# Patient Record
Sex: Male | Born: 1935
Health system: Southern US, Community
[De-identification: ages and names within clinical notes are randomized; demographics above are authoritative.]

## PROBLEM LIST (undated history)

## (undated) DIAGNOSIS — I10 Essential (primary) hypertension: Secondary | ICD-10-CM

## (undated) DIAGNOSIS — Z951 Presence of aortocoronary bypass graft: Secondary | ICD-10-CM

## (undated) DIAGNOSIS — G4733 Obstructive sleep apnea (adult) (pediatric): Secondary | ICD-10-CM

## (undated) DIAGNOSIS — A419 Sepsis, unspecified organism: Secondary | ICD-10-CM

## (undated) DIAGNOSIS — I440 Atrioventricular block, first degree: Secondary | ICD-10-CM

## (undated) DIAGNOSIS — K810 Acute cholecystitis: Secondary | ICD-10-CM

## (undated) DIAGNOSIS — R600 Localized edema: Secondary | ICD-10-CM

## (undated) DIAGNOSIS — I6523 Occlusion and stenosis of bilateral carotid arteries: Secondary | ICD-10-CM

## (undated) DIAGNOSIS — K8020 Calculus of gallbladder without cholecystitis without obstruction: Secondary | ICD-10-CM

## (undated) DIAGNOSIS — M48061 Spinal stenosis, lumbar region without neurogenic claudication: Secondary | ICD-10-CM

## (undated) DIAGNOSIS — R569 Unspecified convulsions: Secondary | ICD-10-CM

## (undated) DIAGNOSIS — E876 Hypokalemia: Secondary | ICD-10-CM

## (undated) DIAGNOSIS — R2689 Other abnormalities of gait and mobility: Secondary | ICD-10-CM

## (undated) DIAGNOSIS — F419 Anxiety disorder, unspecified: Secondary | ICD-10-CM

## (undated) DIAGNOSIS — M4316 Spondylolisthesis, lumbar region: Secondary | ICD-10-CM

## (undated) DIAGNOSIS — H8110 Benign paroxysmal vertigo, unspecified ear: Secondary | ICD-10-CM

## (undated) DIAGNOSIS — I639 Cerebral infarction, unspecified: Secondary | ICD-10-CM

## (undated) DIAGNOSIS — K219 Gastro-esophageal reflux disease without esophagitis: Secondary | ICD-10-CM

## (undated) DIAGNOSIS — K589 Irritable bowel syndrome without diarrhea: Secondary | ICD-10-CM

## (undated) DIAGNOSIS — G4730 Sleep apnea, unspecified: Secondary | ICD-10-CM

## (undated) DIAGNOSIS — K429 Umbilical hernia without obstruction or gangrene: Secondary | ICD-10-CM

## (undated) DIAGNOSIS — N3281 Overactive bladder: Secondary | ICD-10-CM

## (undated) DIAGNOSIS — E785 Hyperlipidemia, unspecified: Secondary | ICD-10-CM

## (undated) DIAGNOSIS — I739 Peripheral vascular disease, unspecified: Secondary | ICD-10-CM

## (undated) DIAGNOSIS — I5032 Chronic diastolic (congestive) heart failure: Secondary | ICD-10-CM

## (undated) DIAGNOSIS — N400 Enlarged prostate without lower urinary tract symptoms: Secondary | ICD-10-CM

## (undated) DIAGNOSIS — R159 Full incontinence of feces: Secondary | ICD-10-CM

## (undated) DIAGNOSIS — I2510 Atherosclerotic heart disease of native coronary artery without angina pectoris: Secondary | ICD-10-CM

## (undated) DIAGNOSIS — G2581 Restless legs syndrome: Secondary | ICD-10-CM

## (undated) DIAGNOSIS — C801 Malignant (primary) neoplasm, unspecified: Secondary | ICD-10-CM

## (undated) DIAGNOSIS — I219 Acute myocardial infarction, unspecified: Secondary | ICD-10-CM

## (undated) HISTORY — DX: Overactive bladder: N32.81

## (undated) HISTORY — DX: Spondylolisthesis, lumbar region: M43.16

## (undated) HISTORY — DX: Essential (primary) hypertension: I10

## (undated) HISTORY — PX: TONSILLECTOMY: SUR1361

## (undated) HISTORY — PX: COLONOSCOPY: SHX174

## (undated) HISTORY — DX: Atherosclerotic heart disease of native coronary artery without angina pectoris: I25.10

## (undated) HISTORY — DX: Spinal stenosis, lumbar region without neurogenic claudication: M48.061

## (undated) HISTORY — DX: Acute myocardial infarction, unspecified: I21.9

## (undated) HISTORY — DX: Benign prostatic hyperplasia without lower urinary tract symptoms: N40.0

## (undated) HISTORY — DX: Malignant (primary) neoplasm, unspecified: C80.1

---
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
## 1979-12-05 HISTORY — PX: NASAL SEPTOPLASTY W/ TURBINOPLASTY: SHX2070

## 2004-12-04 DIAGNOSIS — Z951 Presence of aortocoronary bypass graft: Secondary | ICD-10-CM

## 2004-12-04 DIAGNOSIS — I2510 Atherosclerotic heart disease of native coronary artery without angina pectoris: Secondary | ICD-10-CM

## 2004-12-04 DIAGNOSIS — I219 Acute myocardial infarction, unspecified: Secondary | ICD-10-CM

## 2004-12-04 HISTORY — DX: Acute myocardial infarction, unspecified: I21.9

## 2004-12-04 HISTORY — DX: Presence of aortocoronary bypass graft: Z95.1

## 2004-12-04 HISTORY — PX: LEFT HEART CATH AND CORONARY ANGIOGRAPHY: CATH118249

## 2004-12-04 HISTORY — DX: Atherosclerotic heart disease of native coronary artery without angina pectoris: I25.10

## 2004-12-04 HISTORY — PX: CORONARY ARTERY BYPASS GRAFT: SHX141

## 2010-03-14 ENCOUNTER — Ambulatory Visit: Payer: Self-pay | Admitting: Internal Medicine

## 2010-04-27 ENCOUNTER — Ambulatory Visit: Payer: Self-pay | Admitting: Gastroenterology

## 2011-10-16 ENCOUNTER — Emergency Department: Payer: Self-pay | Admitting: *Deleted

## 2012-07-09 ENCOUNTER — Ambulatory Visit (INDEPENDENT_AMBULATORY_CARE_PROVIDER_SITE_OTHER): Payer: Medicare Other | Admitting: Internal Medicine

## 2012-07-09 ENCOUNTER — Encounter: Payer: Self-pay | Admitting: Internal Medicine

## 2012-07-09 VITALS — BP 120/64 | HR 60 | Temp 97.7°F | Resp 14 | Ht 71.0 in | Wt 197.2 lb

## 2012-07-09 DIAGNOSIS — N400 Enlarged prostate without lower urinary tract symptoms: Secondary | ICD-10-CM

## 2012-07-09 DIAGNOSIS — C4491 Basal cell carcinoma of skin, unspecified: Secondary | ICD-10-CM | POA: Insufficient documentation

## 2012-07-09 DIAGNOSIS — R7309 Other abnormal glucose: Secondary | ICD-10-CM

## 2012-07-09 DIAGNOSIS — M810 Age-related osteoporosis without current pathological fracture: Secondary | ICD-10-CM | POA: Insufficient documentation

## 2012-07-09 DIAGNOSIS — I2510 Atherosclerotic heart disease of native coronary artery without angina pectoris: Secondary | ICD-10-CM | POA: Insufficient documentation

## 2012-07-09 DIAGNOSIS — I10 Essential (primary) hypertension: Secondary | ICD-10-CM

## 2012-07-09 DIAGNOSIS — R7302 Impaired glucose tolerance (oral): Secondary | ICD-10-CM | POA: Insufficient documentation

## 2012-07-09 DIAGNOSIS — H8110 Benign paroxysmal vertigo, unspecified ear: Secondary | ICD-10-CM | POA: Insufficient documentation

## 2012-07-09 DIAGNOSIS — E785 Hyperlipidemia, unspecified: Secondary | ICD-10-CM

## 2012-07-09 DIAGNOSIS — I219 Acute myocardial infarction, unspecified: Secondary | ICD-10-CM | POA: Insufficient documentation

## 2012-07-09 HISTORY — DX: Basal cell carcinoma of skin, unspecified: C44.91

## 2012-07-09 NOTE — Progress Notes (Signed)
Patient ID: David Atkins, male   DOB: July 20, 1929, 78 y.o.   MRN: [id number]  Patient Active Problem List  Diagnosis  . Osteoporosis  . BPH (benign prostatic hyperplasia)  . 3-vessel coronary artery disease  . Hypertension  . Myocardial infarction  . Other and unspecified hyperlipidemia  . Impaired glucose tolerance  . Basal cell carcinoma  . Vertigo, benign paroxysmal    Subjective:  CC:   Chief Complaint  Patient presents with  . New Patient    HPI:   David Atkins is a 78 y.o. male who presents as a new patient to establish primary care with the chief complaint of  1) obstructive sleep apnea   Has a history of OSA diagnosed many years ago,  Wears his facemask religiously  At 7 cm H20.  Last eval was before 2009.  2 or 3 prior repeat studies unchanged.  Uses [employer] for equipment.  Wakes up feeling refreshed,  No somnolence during day. 2) Ill fitting dentures.  His dentures were created by Dr. Breck Coons and clean still there so he came to as low deficit he has lost weight due to avoidance of certain foods. He is requesting help in finding a way to make these are correctly.   3) Chronic athlete's foot managed by [facility] with topicals.  4) Bladder outlet obstruction syndrome secondary to BPH.   No longer checking PSA<  Was 0.4 ,,  Stopped at age 64.  5) Recent blurring of vision in left eye,  Scheduled to see Dr. Fransico Michael again soon 6) Vertigo.  Quit smoking after 3 yrs in the service,  Quit in 1964. Was seeing Dr. Marguerite Olea and Dr. [facility]lyn Fudge [facility] twice annually for savings on medications.  He has relocated from [place] in 2009. Enjoys good health. 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    Now with [employer] .  Marland Kitchen Dentures are ill fitting ,cdone by Breck Coons in [place].  Painful to use the lower set, would like referral to a denture fitting plae.  Bowels have been regular. Last colonoscopy was many  years ago,  Last one was in 2002,  One polyp removed,  No FH of colon CA. Wants to defer further screening unless FOBT done annually is positive.      Past Medical History  Diagnosis Date  . Osteoporosis 2010    by DEXA at [facility]  . BPH (benign prostatic hyperplasia)   . 3-vessel coronary artery disease 2006  . Hypertension   . Myocardial infarction 2006  . basal cell     Past Surgical History  Procedure Date  . Appendectomy 1951  . Nasal septoplasty w/ turbinoplasty 1981  . Coronary artery bypass graft 2006    3 vessel, s/p AMI    History reviewed. No pertinent family history.  History   Social History  . Marital Status: Married    Spouse Name: N/A    Number of Children: N/A  . Years of Education: N/A   Occupational History  . Not on file.   Social History Main Topics  . Smoking status: Former Smoker    Types: Cigarettes    Quit date: 07/10/1963  . Smokeless tobacco: Never Used  . Alcohol Use: No  . Drug Use: No  . Sexually Active: Not on file   Other Topics Concern  . Not on file   Social History Narrative  . No narrative on file  Allergies  Allergen Reactions  . Sulfa Antibiotics      Review of Systems:   The remainder of the review of systems was negative except those addressed in the HPI.       Objective:  BP 120/64  Pulse 60  Temp 97.7 F (36.5 C) (Oral)  Resp 14  Ht 5\' 11"  (1.803 m)  Wt 197 lb 4 oz (89.472 kg)  BMI 27.51 kg/m2  SpO2 96%  General appearance: alert, cooperative and appears stated age Ears: normal TM's and external ear canals both ears Throat: lips, mucosa, and tongue normal; teeth and gums normal Neck: no adenopathy, no carotid bruit, supple, symmetrical, trachea midline and thyroid not enlarged, symmetric, no tenderness/mass/nodules Back: symmetric, no curvature. ROM normal. No CVA tenderness. Lungs: clear to auscultation bilaterally Heart: regular rate and rhythm, S1, S2 normal, no murmur, click, rub or  gallop Abdomen: soft, non-tender; bowel sounds normal; no masses,  no organomegaly Pulses: 2+ and symmetric Skin: Skin color, texture, turgor normal. No rashes or lesions Lymph nodes: Cervical, supraclavicular, and axillary nodes normal.  Assessment and Plan:  Other and unspecified hyperlipidemia Managed with zocor ,  LDl 71,  HDl 38 trigs 121   By July 2013.  Some myalgias with excessive physical activity,  He reamins very active .    Basal cell carcinoma Managed with Moh's by Dr. Glendell Docker at [facility].   3-vessel coronary artery disease Status post 3 vessel CABG remotely. He has been asymptomatic for years. He is on the appropriate medications. He will return for fasting labs and see that in 6 months.  Hypertension Currently well-controlled on current medications. No changes.  Impaired glucose tolerance He has never been diagnosed with diabetes. When he returns in 6 months we will do a hemoglobin A1c  BPH (benign prostatic hyperplasia) Managed with Cardura and Detrol LA.  Vertigo, benign paroxysmal Infrequent, managed with meclizine. He's had a recent recurrence after many years of quiescence   Updated Medication List Outpatient Encounter Prescriptions as of 07/09/2012  Medication Sig Dispense Refill  . acetaminophen (TYLENOL) 325 MG tablet Take 650 mg by mouth 2 (two) times daily as needed.      Marland Kitchen alendronate (FOSAMAX) 70 MG tablet Take 70 mg by mouth every 7 (seven) days. Take with a full glass of water on an empty stomach.      Marland Kitchen aspirin 81 MG tablet Take 81 mg by mouth daily.      . Calcium Carbonate-Vitamin D (CALCIUM + D PO) Take by mouth.      . doxazosin (CARDURA) 4 MG tablet Take 4 mg by mouth at bedtime.      Marland Kitchen lisinopril (PRINIVIL,ZESTRIL) 20 MG tablet Take 10 mg by mouth 2 (two) times daily.      . metoprolol tartrate (LOPRESSOR) 25 MG tablet Take 25 mg by mouth 2 (two) times daily.      . Saw Palmetto, Serenoa repens, (SAW PALMETTO PO) Take 2 tablets by mouth 2 (two)  times daily.      . simvastatin (ZOCOR) 40 MG tablet Take 20 mg by mouth every evening.      . tolterodine (DETROL LA) 4 MG 24 hr capsule Take 4 mg by mouth daily.         No orders of the defined types were placed in this encounter.    No Follow-up on file.

## 2012-07-09 NOTE — Assessment & Plan Note (Signed)
Managed with Moh's by Dr. Glendell Docker at [facility].

## 2012-07-09 NOTE — Assessment & Plan Note (Signed)
Managed with Cardura and Detrol LA.

## 2012-07-09 NOTE — Assessment & Plan Note (Signed)
Status post 3 vessel CABG remotely. He has been asymptomatic for years. He is on the appropriate medications. He will return for fasting labs and see that in 6 months.

## 2012-07-09 NOTE — Assessment & Plan Note (Signed)
Currently well-controlled on current medications. No changes.

## 2012-07-09 NOTE — Assessment & Plan Note (Signed)
Infrequent, managed with meclizine. He's had a recent recurrence after many years of quiescence

## 2012-07-09 NOTE — Assessment & Plan Note (Signed)
Managed with zocor ,  LDl 71,  HDl 38 trigs 121   By July 2013.  Some myalgias with excessive physical activity,  He reamins very active .

## 2012-07-09 NOTE — Assessment & Plan Note (Signed)
He has never been diagnosed with diabetes. When he returns in 6 months we will do a hemoglobin A1c

## 2012-12-01 ENCOUNTER — Emergency Department: Payer: Self-pay | Admitting: Emergency Medicine

## 2012-12-01 LAB — CBC WITH DIFFERENTIAL/PLATELET
Basophil #: 0 10*3/uL (ref 0.0–0.1)
Basophil %: 0.6 %
Eosinophil #: 0.2 10*3/uL (ref 0.0–0.7)
Eosinophil %: 2.4 %
HCT: 44.8 % (ref 40.0–52.0)
HGB: 15.1 g/dL (ref 13.0–18.0)
Lymphocyte #: 1.5 10*3/uL (ref 1.0–3.6)
Lymphocyte %: 19.1 %
MCH: 32.1 pg (ref 26.0–34.0)
MCHC: 33.7 g/dL (ref 32.0–36.0)
MCV: 95 fL (ref 80–100)
Monocyte #: 0.6 x10 3/mm (ref 0.2–1.0)
Monocyte %: 8 %
Neutrophil #: 5.5 10*3/uL (ref 1.4–6.5)
Neutrophil %: 69.9 %
Platelet: 172 10*3/uL (ref 150–440)
RBC: 4.71 10*6/uL (ref 4.40–5.90)
RDW: 13.2 % (ref 11.5–14.5)
WBC: 7.9 10*3/uL (ref 3.8–10.6)

## 2012-12-01 LAB — COMPREHENSIVE METABOLIC PANEL
Albumin: 3.7 g/dL (ref 3.4–5.0)
Alkaline Phosphatase: 73 U/L (ref 50–136)
Anion Gap: 8 (ref 7–16)
BUN: 13 mg/dL (ref 7–18)
Bilirubin,Total: 0.5 mg/dL (ref 0.2–1.0)
Calcium, Total: 8.6 mg/dL (ref 8.5–10.1)
Chloride: 111 mmol/L — ABNORMAL HIGH (ref 98–107)
Co2: 26 mmol/L (ref 21–32)
Creatinine: 1.13 mg/dL (ref 0.60–1.30)
EGFR (African American): >60
EGFR (Non-African Amer.): >60
Glucose: 93 mg/dL (ref 65–99)
Osmolality: 289 (ref 275–301)
Potassium: 3.8 mmol/L (ref 3.5–5.1)
SGOT(AST): 30 U/L (ref 15–37)
SGPT (ALT): 40 U/L (ref 12–78)
Sodium: 145 mmol/L (ref 136–145)
Total Protein: 6.9 g/dL (ref 6.4–8.2)

## 2012-12-06 ENCOUNTER — Encounter: Payer: Self-pay | Admitting: Internal Medicine

## 2012-12-06 ENCOUNTER — Ambulatory Visit (INDEPENDENT_AMBULATORY_CARE_PROVIDER_SITE_OTHER): Payer: Medicare Other | Admitting: Internal Medicine

## 2012-12-06 VITALS — BP 123/73 | HR 60 | Temp 98.0°F | Resp 12 | Ht 70.0 in | Wt 196.0 lb

## 2012-12-06 DIAGNOSIS — J40 Bronchitis, not specified as acute or chronic: Secondary | ICD-10-CM

## 2012-12-06 DIAGNOSIS — K208 Other esophagitis without bleeding: Secondary | ICD-10-CM

## 2012-12-06 DIAGNOSIS — R7302 Impaired glucose tolerance (oral): Secondary | ICD-10-CM

## 2012-12-06 DIAGNOSIS — I6522 Occlusion and stenosis of left carotid artery: Secondary | ICD-10-CM

## 2012-12-06 DIAGNOSIS — T50905A Adverse effect of unspecified drugs, medicaments and biological substances, initial encounter: Secondary | ICD-10-CM

## 2012-12-06 DIAGNOSIS — I6529 Occlusion and stenosis of unspecified carotid artery: Secondary | ICD-10-CM

## 2012-12-06 DIAGNOSIS — R9389 Abnormal findings on diagnostic imaging of other specified body structures: Secondary | ICD-10-CM

## 2012-12-06 DIAGNOSIS — R7309 Other abnormal glucose: Secondary | ICD-10-CM

## 2012-12-06 MED ORDER — BENZONATATE 200 MG PO CAPS: 200.0000 mg | ORAL_CAPSULE | Freq: Two times a day (BID) | ORAL | Status: DC | PRN

## 2012-12-06 MED ORDER — LEVOFLOXACIN 500 MG PO TABS: 500.0000 mg | ORAL_TABLET | Freq: Every day | ORAL | Status: DC

## 2012-12-06 MED ORDER — PREDNISONE (PAK) 10 MG PO TABS: ORAL_TABLET | ORAL | Status: DC

## 2012-12-06 NOTE — Patient Instructions (Addendum)
You have a sinusitis/bronchitis   .  I am prescribing an antibiotic (levaquin) and a prednisone taper  To manage the infectin and the inflammation in your ear/sinuses.   I also advise use of the following OTC meds to help with your other symptoms.    flushes your sinuses twice daily with Simply Saline (do over the sink because if you do it right you will spit out globs of mucus)  Use benzonatate capsules every 8 hours   FOR THE COUGH.  Gargle with the Magic Mouthwash th ER doctor gave your for sore throat.   I am referring you to Dr. Gilda Crease for evaluation of your carotid arteries

## 2012-12-06 NOTE — Progress Notes (Signed)
Patient ID: David Atkins, male   DOB: [birth year], 79 y.o.   MRN: [id number]    Patient Active Problem List  Diagnosis  . Osteoporosis  . BPH (benign prostatic hyperplasia)  . 3-vessel coronary artery disease  . Hypertension  . Myocardial infarction  . Other and unspecified hyperlipidemia  . Impaired glucose tolerance  . Basal cell carcinoma  . Vertigo, benign paroxysmal  . Bronchitis with tracheitis  . Pill esophagitis    Subjective:  CC:   No chief complaint on file.   HPI:   David Hutchingsis a 79 y.o. male who presents Er follow up. ON the day in question he ingested a large vitamin c tablet broken into 3 pieces and developed the sensation that one of the fragments lodged in the back of his throat.    He underwent a foreign body eval in ED including a CT of neck and fiberoptic laryngoscopy which revealed no foreign body, only a  mucous retention cyst. However the  CT of neck raised a concern for carotid stenosis of the left IC bulb and vascular evaluation was recommended.  For the past 3 days he has had a severe cough.  Symptoms began after his ER visit. His cough is productive of green sputum. He's been having bodyaches relieved with Tylenol and over-the-counter medications. He is been avoiding antihistamines as they bother his prostate. His wife David Atkins is having surgery on Monday and he is concerned he would not be well enough to be around her.,     Past Medical History  Diagnosis Date  . Osteoporosis 2010    by DEXA at [facility]  . BPH (benign prostatic hyperplasia)   . 3-vessel coronary artery disease 2006  . Hypertension   . Myocardial infarction 2006  . basal cell     Past Surgical History  Procedure Date  . Appendectomy 1951  . Nasal septoplasty w/ turbinoplasty 1981  . Coronary artery bypass graft 2006    3 vessel, s/p AMI     The following portions of the patient's history were reviewed and updated as appropriate: Allergies, current medications, and problem  list.    Review of Systems:  Patient denies , fevers, malaise, unintentional weight loss, skin rash, eye pain, dysphagia,  hemoptysis , dyspnea, wheezing, chest pain, palpitations, orthopnea, edema, abdominal pain, nausea, melena, diarrhea, constipation, flank pain, dysuria, hematuria, urinary  Frequency, nocturia, numbness, tingling, seizures,  Focal weakness, Loss of consciousness,  Tremor, insomnia, depression, anxiety, and suicidal ideation.        History   Social History  . Marital Status: Married    Spouse Name: N/A    Number of Children: N/A  . Years of Education: N/A   Occupational History  . Not on file.   Social History Main Topics  . Smoking status: Former Smoker    Types: Cigarettes    Quit date: 07/10/1963  . Smokeless tobacco: Never Used  . Alcohol Use: No  . Drug Use: No  . Sexually Active: Not on file   Other Topics Concern  . Not on file   Social History Narrative  . No narrative on file    Objective:  BP 123/73  Pulse 60  Temp 98 F (36.7 C) (Oral)  Resp 12  Ht 5\' 10"  (1.778 m)  Wt 196 lb (88.905 kg)  BMI 28.12 kg/m2  SpO2 98%  General appearance: alert, cooperative and appears stated age Ears: normal TM's and external ear canals both ears Throat: lips, mucosa, and tongue normal;  teeth and gums normal Neck: no adenopathy, no carotid bruit, supple, symmetrical, trachea midline and thyroid not enlarged, symmetric, no tenderness/mass/nodules Back: symmetric, no curvature. ROM normal. No CVA tenderness. Lungs: clear to auscultation bilaterally Heart: regular rate and rhythm, S1, S2 normal, no murmur, click, rub or gallop Abdomen: soft, non-tender; bowel sounds normal; no masses,  no organomegaly Pulses: 2+ and symmetric Skin: Skin color, texture, turgor normal. No rashes or lesions Lymph nodes: Cervical, supraclavicular, and axillary nodes normal.  Assessment and Plan:  Impaired glucose tolerance His fasting glucoses have been below the  range of diabetes diagnosis per labs sent by the [facility]. The prednisone taper which is being prescribed today may elevate his blood sugars if he is to have blood work done by the [facility]. He was advised of this today.  Bronchitis with tracheitis Accompanied by sinusitis and dull eardrums  concerning for early otitis. Likely due to recent exposure in the ER. Levaquin and Tessalon and prednisone taper prescribed for 7 days.  Pill esophagitis Secondary to vitamin C ingestion. ER evaluation included ENT fiberoptic laryngoscopy which showed erythema and irritation but no pill fragments. Since he is still symptomatic I have recommended that he resume the Magic mouthwash that was prescribed to him by the ER physician.  Abnormal CT scan, neck Referral to vascular surgery for ultrasound of carotid arteries has been done. He does have risk factors including hyperlipidemia and known coronary artery disease. He has no bruit on exam.   Updated Medication List Outpatient Encounter Prescriptions as of 12/06/2012  Medication Sig Dispense Refill  . acetaminophen (TYLENOL) 325 MG tablet Take 650 mg by mouth 2 (two) times daily as needed.      Marland Kitchen alendronate (FOSAMAX) 70 MG tablet Take 70 mg by mouth every 7 (seven) days. Take with a full glass of water on an empty stomach.      Marland Kitchen aspirin 81 MG tablet Take 81 mg by mouth daily.      . Calcium Carbonate-Vitamin D (CALCIUM + D PO) Take by mouth.      . doxazosin (CARDURA) 4 MG tablet Take 4 mg by mouth at bedtime.      Marland Kitchen lisinopril (PRINIVIL,ZESTRIL) 20 MG tablet Take 10 mg by mouth 2 (two) times daily.      . metoprolol tartrate (LOPRESSOR) 25 MG tablet Take 25 mg by mouth 2 (two) times daily.      . Saw Palmetto, Serenoa repens, (SAW PALMETTO PO) Take 2 tablets by mouth 2 (two) times daily.      . simvastatin (ZOCOR) 40 MG tablet Take 20 mg by mouth every evening.      . tolterodine (DETROL LA) 4 MG 24 hr capsule Take 4 mg by mouth daily.      .  benzonatate (TESSALON) 200 MG capsule Take 1 capsule (200 mg total) by mouth 2 (two) times daily as needed for cough.  20 capsule  0  . levofloxacin (LEVAQUIN) 500 MG tablet Take 1 tablet (500 mg total) by mouth daily.  7 tablet  0  . predniSONE (STERAPRED UNI-PAK) 10 MG tablet 6 tablets on day 1, decrease by tablet daily until gone  21 tablet  0

## 2012-12-07 ENCOUNTER — Encounter: Payer: Self-pay | Admitting: Internal Medicine

## 2012-12-07 DIAGNOSIS — T50905A Adverse effect of unspecified drugs, medicaments and biological substances, initial encounter: Secondary | ICD-10-CM | POA: Insufficient documentation

## 2012-12-07 DIAGNOSIS — I6523 Occlusion and stenosis of bilateral carotid arteries: Secondary | ICD-10-CM | POA: Insufficient documentation

## 2012-12-07 DIAGNOSIS — J40 Bronchitis, not specified as acute or chronic: Secondary | ICD-10-CM | POA: Insufficient documentation

## 2012-12-07 NOTE — Assessment & Plan Note (Signed)
His fasting glucoses have been below the range of diabetes diagnosis per labs sent by the [facility].

## 2012-12-07 NOTE — Assessment & Plan Note (Addendum)
Accompanied by sinusitis and dull eardrums  concerning for early otitis. Likely due to recent exposure in the ER. Levaquin and Tessalon and prednisone taper prescribed for 7 days.

## 2012-12-07 NOTE — Assessment & Plan Note (Signed)
Secondary to vitamin C ingestion. ER evaluation included ENT fiberoptic laryngoscopy which showed erythema and irritation but no pill fragments. Since he is still symptomatic I have recommended that he resume the Magic mouthwash that was prescribed to him by the ER physician.

## 2012-12-07 NOTE — Assessment & Plan Note (Signed)
Referral to vascular surgery for ultrasound of carotid arteries has been done. He does have risk factors including hyperlipidemia and known coronary artery disease. He has no bruit on exam.

## 2012-12-19 ENCOUNTER — Other Ambulatory Visit: Payer: Self-pay | Admitting: Internal Medicine

## 2012-12-19 NOTE — Telephone Encounter (Signed)
Should patient get a refill?  Please advise.

## 2012-12-20 NOTE — Telephone Encounter (Signed)
Refills sent.  Needs appt for follow up next week

## 2012-12-24 ENCOUNTER — Telehealth: Payer: Self-pay | Admitting: General Practice

## 2012-12-24 NOTE — Telephone Encounter (Signed)
[facility] called stating pt was walking around looking for his appt place. Per [facility] I do not see an appt for today on the pt schedule.

## 2012-12-26 NOTE — Telephone Encounter (Signed)
Pt has an appt 01/06/13.

## 2013-01-06 ENCOUNTER — Encounter: Payer: Self-pay | Admitting: Internal Medicine

## 2013-01-06 ENCOUNTER — Ambulatory Visit (INDEPENDENT_AMBULATORY_CARE_PROVIDER_SITE_OTHER): Payer: Medicare Other | Admitting: Internal Medicine

## 2013-01-06 VITALS — BP 124/58 | HR 71 | Temp 97.9°F | Resp 16 | Wt 201.0 lb

## 2013-01-06 DIAGNOSIS — R1310 Dysphagia, unspecified: Secondary | ICD-10-CM

## 2013-01-06 DIAGNOSIS — J40 Bronchitis, not specified as acute or chronic: Secondary | ICD-10-CM

## 2013-01-06 DIAGNOSIS — E785 Hyperlipidemia, unspecified: Secondary | ICD-10-CM

## 2013-01-06 DIAGNOSIS — K208 Other esophagitis without bleeding: Secondary | ICD-10-CM

## 2013-01-06 DIAGNOSIS — K219 Gastro-esophageal reflux disease without esophagitis: Secondary | ICD-10-CM

## 2013-01-06 DIAGNOSIS — E559 Vitamin D deficiency, unspecified: Secondary | ICD-10-CM

## 2013-01-06 DIAGNOSIS — T50905A Adverse effect of unspecified drugs, medicaments and biological substances, initial encounter: Secondary | ICD-10-CM

## 2013-01-06 DIAGNOSIS — R790 Abnormal level of blood mineral: Secondary | ICD-10-CM

## 2013-01-06 DIAGNOSIS — R7989 Other specified abnormal findings of blood chemistry: Secondary | ICD-10-CM

## 2013-01-06 LAB — COMPREHENSIVE METABOLIC PANEL
ALT: 44 U/L (ref 0–53)
AST: 39 U/L — ABNORMAL HIGH (ref 0–37)
Albumin: 3.5 g/dL (ref 3.5–5.2)
Alkaline Phosphatase: 49 U/L (ref 39–117)
BUN: 18 mg/dL (ref 6–23)
CO2: 27 mEq/L (ref 19–32)
Calcium: 8.7 mg/dL (ref 8.4–10.5)
Chloride: 109 mEq/L (ref 96–112)
Creatinine, Ser: 1.1 mg/dL (ref 0.4–1.5)
GFR: 65.42 mL/min (ref >60.00)
Glucose, Bld: 108 mg/dL — ABNORMAL HIGH (ref 70–99)
Potassium: 4.2 mEq/L (ref 3.5–5.1)
Sodium: 142 mEq/L (ref 135–145)
Total Bilirubin: 0.5 mg/dL (ref 0.3–1.2)
Total Protein: 6.2 g/dL (ref 6.0–8.3)

## 2013-01-06 LAB — MAGNESIUM: Magnesium: 1.9 mg/dL (ref 1.5–2.5)

## 2013-01-06 MED ORDER — OMEPRAZOLE 20 MG PO CPDR: 20.0000 mg | DELAYED_RELEASE_CAPSULE | Freq: Every day | ORAL | Status: DC

## 2013-01-06 NOTE — Assessment & Plan Note (Addendum)
Secondary to vitamin C ingestion. ER evaluation included ENT fiberoptic laryngoscopy which showed erythema and irritation but no pill fragments. continued symptoms with large pills but not with alendronate.  Barium swallow, modified to rule out stricture.

## 2013-01-06 NOTE — Patient Instructions (Addendum)
I am checking your magnesium and vitamin D levels today  You may try a chewable calcium supplement  I am ordering a barium swallow study to evaluate your swallowing issues

## 2013-01-06 NOTE — Assessment & Plan Note (Signed)
Now resolved after levaquin 7 days and prednisone taper

## 2013-01-06 NOTE — Progress Notes (Signed)
Patient ID: David Atkins, male   DOB: 05-01-1929, 79 y.o.   MRN: [id number]  Patient Active Problem List  Diagnosis  . Osteoporosis  . BPH (benign prostatic hyperplasia)  . 3-vessel coronary artery disease  . Hypertension  . Myocardial infarction  . Other and unspecified hyperlipidemia  . Impaired glucose tolerance  . Basal cell carcinoma  . Vertigo, benign paroxysmal  . Bronchitis with tracheitis  . Pill esophagitis  . Abnormal CT scan, neck  . GERD (gastroesophageal reflux disease)    Subjective:  CC:   Chief Complaint  Patient presents with  . Follow-up    HPI:   David Hutchingsis a 79 y.o. male who presents Follow up on multiple medical issues.  1) His recent episode of bronchitis is finally resolving without sequelae of cough or dyspnea.   2) The recent finding of carotid stenosis by CT of the neck has been quantified with carotid ultrasound and Vascular surgery as less than 50% .  ASA and statin were recommended,  Follow up on e year.    3) He continues to have trouble managing large pills since he had the ER visit for swallowing a pill fragment  No choking episdoes or cough after eating but has the sensatio nof large pills getting stuck .    4) He continues to have symptoms of reflux despite using ranitidine daily.     Past Medical History  Diagnosis Date  . Osteoporosis 2010    by [facility] at [facility]  . BPH (benign prostatic hyperplasia)   . 3-vessel coronary artery disease 2006  . Hypertension   . Myocardial infarction 2006  . basal cell     Past Surgical History  Procedure Date  . Appendectomy 1951  . Nasal septoplasty w/ turbinoplasty 1981  . Coronary artery bypass graft 2006    3 vessel, s/p AMI         The following portions of the patient's history were reviewed and updated as appropriate: Allergies, current medications, and problem list.    Review of Systems:   Patient denies headache, fevers, malaise, unintentional weight loss, skin rash, eye  pain, sinus congestion and sinus pain, sore throat,  hemoptysis , cough, dyspnea, wheezing, chest pain, palpitations, orthopnea, edema, abdominal pain, nausea, melena, diarrhea, constipation, flank pain, dysuria, hematuria, urinary  Frequency, nocturia, numbness, tingling, seizures,  Focal weakness, Loss of consciousness,  Tremor, insomnia, depression, anxiety, and suicidal ideation.         History   Social History  . Marital Status: Married    Spouse Name: N/A    Number of Children: N/A  . Years of Education: N/A   Occupational History  . Not on file.   Social History Main Topics  . Smoking status: Former Smoker    Types: Cigarettes    Quit date: 07/10/1963  . Smokeless tobacco: Never Used  . Alcohol Use: No  . Drug Use: No  . Sexually Active: Not on file   Other Topics Concern  . Not on file   Social History Narrative  . No narrative on file    Objective:  BP 124/58  Pulse 71  Temp 97.9 F (36.6 C) (Oral)  Resp 16  Wt 201 lb (91.173 kg)  SpO2 97%  General appearance: alert, cooperative and appears stated age Ears: normal TM's and external ear canals both ears Throat: lips, mucosa, and tongue normal; teeth and gums normal Neck: no adenopathy, no carotid bruit, supple, symmetrical, trachea midline and thyroid not enlarged, symmetric,  no tenderness/mass/nodules Back: symmetric, no curvature. ROM normal. No CVA tenderness. Lungs: clear to auscultation bilaterally Heart: regular rate and rhythm, S1, S2 normal, no murmur, click, rub or gallop Abdomen: soft, non-tender; bowel sounds normal; no masses,  no organomegaly Pulses: 2+ and symmetric Skin: Skin color, texture, turgor normal. No rashes or lesions Lymph nodes: Cervical, supraclavicular, and axillary nodes normal.  Assessment and Plan:  Bronchitis with tracheitis Now resolved after levaquin 7 days and prednisone taper  Pill esophagitis Secondary to vitamin C ingestion. ER evaluation included ENT  fiberoptic laryngoscopy which showed erythema and irritation but no pill fragments. continued symptoms with large pills but not with alendronate.  Barium swallow, modified to rule out stricture.  GERD (gastroesophageal reflux disease) Stopping ranitidine and starting PPI   Updated Medication List Outpatient Encounter Prescriptions as of 01/06/2013  Medication Sig Dispense Refill  . acetaminophen (TYLENOL) 325 MG tablet Take 650 mg by mouth 2 (two) times daily as needed.      Marland Kitchen alendronate (FOSAMAX) 70 MG tablet Take 70 mg by mouth every 7 (seven) days. Take with a full glass of water on an empty stomach.      Marland Kitchen aspirin 81 MG tablet Take 81 mg by mouth daily.      . Calcium Carbonate-Vitamin D (CALCIUM + D PO) Take by mouth.      . doxazosin (CARDURA) 4 MG tablet Take 4 mg by mouth at bedtime.      Marland Kitchen lisinopril (PRINIVIL,ZESTRIL) 20 MG tablet Take 10 mg by mouth 2 (two) times daily.      . metoprolol tartrate (LOPRESSOR) 25 MG tablet Take 25 mg by mouth 2 (two) times daily.      . Saw Palmetto, Serenoa repens, (SAW PALMETTO PO) Take 2 tablets by mouth 2 (two) times daily.      . simvastatin (ZOCOR) 40 MG tablet Take 20 mg by mouth every evening.      . tolterodine (DETROL LA) 4 MG 24 hr capsule Take 4 mg by mouth daily.      . [DISCONTINUED] benzonatate (TESSALON) 200 MG capsule TAKE 1 CAPSULE (200 MG TOTAL) BY MOUTH 2 (TWO) TIMES DAILY AS NEEDED FOR COUGH.  20 capsule  0  . [DISCONTINUED] levofloxacin (LEVAQUIN) 500 MG tablet TAKE 1 TABLET (500 MG TOTAL) BY MOUTH DAILY.  7 tablet  0  . [DISCONTINUED] predniSONE (STERAPRED UNI-PAK) 10 MG tablet 6 tablets on day 1, decrease by tablet daily until gone  21 tablet  0  . omeprazole (PRILOSEC) 20 MG capsule Take 1 capsule (20 mg total) by mouth daily.  30 capsule  3     Orders Placed This Encounter  Procedures  . Magnesium  . Vitamin D 25 hydroxy  . Comprehensive metabolic panel  . SLP modified barium swallow    Return in about 6 months  (around 07/06/2013).

## 2013-01-06 NOTE — Assessment & Plan Note (Signed)
Stopping ranitidine and starting PPI

## 2013-01-07 LAB — VITAMIN D 25 HYDROXY (VIT D DEFICIENCY, FRACTURES): Vit D, 25-Hydroxy: 32 ng/mL (ref 30–89)

## 2013-01-07 NOTE — Addendum Note (Signed)
Addended by: Sherlene Shams on: 01/07/2013 06:54 AM   Modules accepted: Orders

## 2013-01-13 ENCOUNTER — Ambulatory Visit (INDEPENDENT_AMBULATORY_CARE_PROVIDER_SITE_OTHER): Payer: Medicare Other | Admitting: Internal Medicine

## 2013-01-13 ENCOUNTER — Encounter: Payer: Self-pay | Admitting: Internal Medicine

## 2013-01-13 VITALS — BP 122/68 | HR 67 | Temp 97.9°F | Resp 16 | Ht 69.5 in | Wt 204.5 lb

## 2013-01-13 DIAGNOSIS — E669 Obesity, unspecified: Secondary | ICD-10-CM

## 2013-01-13 DIAGNOSIS — Z125 Encounter for screening for malignant neoplasm of prostate: Secondary | ICD-10-CM

## 2013-01-13 DIAGNOSIS — K219 Gastro-esophageal reflux disease without esophagitis: Secondary | ICD-10-CM

## 2013-01-13 DIAGNOSIS — Z1211 Encounter for screening for malignant neoplasm of colon: Secondary | ICD-10-CM

## 2013-01-13 DIAGNOSIS — Z0000 Encounter for general adult medical examination without abnormal findings: Secondary | ICD-10-CM

## 2013-01-13 DIAGNOSIS — N400 Enlarged prostate without lower urinary tract symptoms: Secondary | ICD-10-CM

## 2013-01-13 DIAGNOSIS — R609 Edema, unspecified: Secondary | ICD-10-CM

## 2013-01-13 NOTE — Patient Instructions (Addendum)
It's time to get your ears cleaned  Your diet has too many complex carbohydrates (starches) in it; you are gaining weight and at increased risk for developing type 2 diabetes   I would you to return in 3 months for fasting blood work.  In the meantime,  I recommend that you follow a low Glycemic index diet   This is  my version of a  "Low GI"  Diet:  It replaces most of your starch meals with high protein, lower carbohydrate choices and  will still lower your blood sugars and allow you to lose 5 to 10 lbs per month if you follow it carefully. All of the foods can be found at grocery stores and in bulk at [employer].  The Atkins protein bars and the protein shakes are available in more varieties at [employer], [employer] and [employer].     7 AM Breakfast:  Low carbohydrate Protein  Shakes (I recommend the EAS AdvantEdge "Carb Control" shakes  Or the low carb shakes by Atkins.   Both are available everywhere:  In  cases at [employer]Js  Or in 4 packs at grocery stores and pharmacies  2.5 carbs  (Alternative is  a toasted Arnold's Sandwhich Thin w/ peanut butter, a "Bagel Thin" with cream cheese and salmon) or  a scrambled egg burrito made with a low carb tortilla .  Avoid cereal and bananas, oatmeal too unless you are cooking the old fashioned kind that takes 30-40 minutes to prepare.  the rest is overly processed, has minimal fiber, and is loaded with carbohydrates!   10 AM: Protein bar by Atkins (the snack size, under 200 cal).  There are many varieties , available widely again or in bulk in limited varieties at BJs)  Other so called "protein bars" tend to be loaded with carbohydrates.  Remember, in food advertising, the word "energy" is synonymous for " carbohydrate."  Lunch: sandwich of [place], (or any lunchmeat, grilled meat or canned tuna), fresh avocado, mayonnaise  and cheese on a lower carbohydrate pita bread, flatbread, or tortilla . Ok to use regular mayonnaise. The bread is the only source or carbohydrate  that can be decreased (Joseph's makes a pita bread and a flat bread that are 50 cal and 4 net carbs ; Toufayan makes a low carb flatbread that's 100 cal and 9 net carbs  and  Mission makes a low carb whole wheat tortilla  That is 210 cal and 6 net carbs)  3 PM:  Mid day :  Another protein bar,  Or a  cheese stick (100 cal, 0 carbs),  Or 1 ounce of  almonds, walnuts, pistachios, pecans, peanuts,  Macadamia nuts. Or a Dannon light n Fit greek yogurt, 80 cal 8 net carbs . Avoid "granola"; the dried cranberries and raisins are loaded with carbohydrates. Mixed nuts ok if no raisins or cranberries or dried fruit.      6 PM  Dinner:  "mean and green:"  Meat/chicken/fish or a high protein legume; , with a green salad, and a low GI  Veggie (broccoli, cauliflower, green beans, spinach, brussel sprouts. Lima beans) : Avoid "Low fat dressings, as well as Reyne Dumas and [address]! They are loaded with sugar! Instead use ranch, vinagrette,  Blue cheese, etc.  There is a low carb pasta by Dreamfield's available at [facility] that is acceptable and tastes great. Try Michel Angel's chicken piccata over low carb pasta. The chicken dish is 0 carbs, and can be found in  frozen section at BJs and [employer]. Also try [employer]" (pulled pork, no sauce,  0 carbs) and his pot roast.   both are in the refrigerated section at BJs   Dreamfield's makes a low carb pasta only 5 g/serving.  Available at all grocery stores,  And tastes like normal pasta  9 PM snack : Breyer's "low carb" fudgsicle or  ice cream bar (Carb Smart line), or  Weight Watcher's ice cream bar , or another "no sugar added" ice cream;a serving of fresh berries/cherries with whipped cream (Avoid bananas, pineapple, grapes  and watermelon on a regular basis because they are high in sugar)   Remember that snack Substitutions should be less than 10 carbs per serving and meals < 20 carbs. Remember to subtract fiber grams and sugar alcohols to get  the "net carbs."

## 2013-01-13 NOTE — Assessment & Plan Note (Addendum)
Uses saw palmetto  . Prostate exam was normal today.

## 2013-01-13 NOTE — Progress Notes (Signed)
Patient ID: David Atkins, male   DOB: [birth year], 79 y.o.   MRN: [id number]  The patient is here for annual Medicare wellness examination and management of other chronic and acute problems.   The risk factors are reflected in the social history.  The roster of all physicians providing medical care to patient - is listed in the Snapshot section of the chart.  Activities of daily living:  The patient is 100% independent in all ADLs: dressing, toileting, feeding as well as independent mobility  Home safety : The patient has smoke detectors in the home. They wear seatbelts.  There are no firearms at home. There is no violence in the home.   There is no risks for hepatitis, STDs or HIV. There is no   history of blood transfusion. They have no travel history to infectious disease endemic areas of the world.  The patient has seen their dentist in the last six month. They have seen their eye doctor in the last year. They admit to slight hearing difficulty with regard to whispered voices and some television programs.  They have deferred audiologic testing in the last year.  They do not  have excessive sun exposure. Discussed the need for sun protection: hats, long sleeves and use of sunscreen if there is significant sun exposure.   Diet: the importance of a healthy diet is discussed. They do have a healthy diet.  The benefits of regular aerobic exercise were discussed. She walks 4 times per week ,  20 minutes.   Depression screen: there are no signs or vegative symptoms of depression- irritability, change in appetite, anhedonia, sadness/tearfullness.  Cognitive assessment: the patient manages all their financial and personal affairs and is actively engaged. They could relate day,date,year and events; recalled 2/3 objects at 3 minutes; performed clock-face test normally.  The following portions of the patient's history were reviewed and updated as appropriate: allergies, current medications, past family  history, past medical history,  past surgical history, past social history  and problem list.  Visual acuity was not assessed per patient preference since she has regular follow up with her ophthalmologist. Hearing and body mass index were assessed and reviewed.   During the course of the visit the patient was educated and counseled about appropriate screening and preventive services including : fall prevention , diabetes screening, nutrition counseling, colorectal cancer screening, and recommended immunizations.    Objective  BP 122/68  Pulse 67  Temp(Src) 97.9 F (36.6 C) (Oral)  Resp 16  Ht 5' 9.5" (1.765 m)  Wt 204 lb 8 oz (92.761 kg)  BMI 29.78 kg/m2  SpO2 95%  General Appearance:    Alert, cooperative, no distress, appears stated age  Head:    Normocephalic, without obvious abnormality, atraumatic  Eyes:    PERRL, conjunctiva/corneas clear, EOM's intact, fundi    benign, both eyes       Ears:    Normal TM's and external ear canals, both ears  Nose:   Nares normal, septum midline, mucosa normal, no drainage   or sinus tenderness  Throat:   Lips, mucosa, and tongue normal; teeth and gums normal  Neck:   Supple, symmetrical, trachea midline, no adenopathy;       thyroid:  No enlargement/tenderness/nodules; no carotid   bruit or JVD  Back:     Symmetric, no curvature, ROM normal, no CVA tenderness  Lungs:     Clear to auscultation bilaterally, respirations unlabored  Chest wall:    No tenderness or deformity  Heart:    Regular rate and rhythm, S1 and S2 normal, no murmur, rub   or gallop  Abdomen:     Soft, non-tender, bowel sounds active all four quadrants,    no masses, no organomegaly  Genitalia:    Normal male without lesion, discharge or tenderness  Rectal:    Normal tone, normal prostate, no masses or tenderness;   guaiac negative stool  Extremities:   Extremities normal, atraumatic, no cyanosis. 3+ pitting  edema.  Severe onychomycosis  Pulses:   2+ and symmetric all  extremities  Skin:   Skin color, texture, turgor normal, no rashes or lesions  Lymph nodes:   Cervical, supraclavicular, and axillary nodes normal  Neurologic:   CNII-XII intact. Normal strength, sensation and reflexes      throughout    Assessment and Plan  BPH (benign prostatic hyperplasia) Uses saw palmetto  . Prostate exam was normal today.   GERD (gastroesophageal reflux disease) Symptoms controlled now with prilosec   Routine general medical examination at a health care facility Testicular, penile, prostate exam and FOBT exam was done today and normal    Updated Medication List Outpatient Encounter Prescriptions as of 01/13/2013  Medication Sig Dispense Refill  . acetaminophen (TYLENOL) 325 MG tablet Take 650 mg by mouth 2 (two) times daily as needed.      Marland Kitchen alendronate (FOSAMAX) 70 MG tablet Take 70 mg by mouth every 7 (seven) days. Take with a full glass of water on an empty stomach.      Marland Kitchen aspirin 81 MG tablet Take 81 mg by mouth daily.      . Calcium Carbonate-Vitamin D (CALCIUM + D PO) Take by mouth.      . doxazosin (CARDURA) 4 MG tablet Take 4 mg by mouth at bedtime.      Marland Kitchen lisinopril (PRINIVIL,ZESTRIL) 20 MG tablet Take 10 mg by mouth 2 (two) times daily.      . metoprolol tartrate (LOPRESSOR) 25 MG tablet Take 25 mg by mouth 2 (two) times daily.      Marland Kitchen omeprazole (PRILOSEC) 20 MG capsule Take 1 capsule (20 mg total) by mouth daily.  30 capsule  3  . Saw Palmetto, Serenoa repens, (SAW PALMETTO PO) Take 2 tablets by mouth 2 (two) times daily.      . simvastatin (ZOCOR) 40 MG tablet Take 20 mg by mouth every evening.      . tolterodine (DETROL LA) 4 MG 24 hr capsule Take 4 mg by mouth daily.       No facility-administered encounter medications on file as of 01/13/2013.

## 2013-01-13 NOTE — Assessment & Plan Note (Signed)
Testicular, penile, prostate exam and FOBT exam was done today and normal

## 2013-01-13 NOTE — Assessment & Plan Note (Signed)
Symptoms controlled now with prilosec

## 2013-01-14 ENCOUNTER — Ambulatory Visit: Payer: Self-pay | Admitting: Internal Medicine

## 2013-01-23 ENCOUNTER — Encounter: Payer: Self-pay | Admitting: Cardiovascular Disease

## 2013-01-23 ENCOUNTER — Ambulatory Visit (INDEPENDENT_AMBULATORY_CARE_PROVIDER_SITE_OTHER): Payer: Medicare Other | Admitting: Cardiovascular Disease

## 2013-01-23 VITALS — BP 110/60 | HR 62 | Ht 69.0 in | Wt 201.0 lb

## 2013-01-23 DIAGNOSIS — I2510 Atherosclerotic heart disease of native coronary artery without angina pectoris: Secondary | ICD-10-CM

## 2013-01-23 DIAGNOSIS — R7309 Other abnormal glucose: Secondary | ICD-10-CM

## 2013-01-23 DIAGNOSIS — I10 Essential (primary) hypertension: Secondary | ICD-10-CM

## 2013-01-23 DIAGNOSIS — E785 Hyperlipidemia, unspecified: Secondary | ICD-10-CM

## 2013-01-23 DIAGNOSIS — R7302 Impaired glucose tolerance (oral): Secondary | ICD-10-CM

## 2013-01-23 NOTE — Progress Notes (Signed)
Patient ID: David Atkins, male    DOB: 1929-06-30, 80 y.o.   MRN: [id number]  HPI Comments: David Atkins is a very pleasant 79 year old gentleman with a history of coronary artery disease, bypass surgery in 2006 at [facility] in [place], mild carotid arterial disease by report, history of hyperlipidemia as well controlled on current medications who has a very small smoking history in the remote past, who presents to establish care. He is a patient of Dr. Darrick Huntsman.  Reports that he is relatively asymptomatic. He does some activities/exercise but nothing very vigorous. Reports he is otherwise very healthy. He reports having borderline diabetes and is working on his weight, trying to decrease his carbohydrate intake. He denies any chest pain, shortness of breath since the surgery in 2006. He has not had followup with cardiology since that time. No recent stress test in the past 8 years.  Overall he has no complaints. When asked about his leg swelling, he reports it is very mild, seems to wax and wane, no change over the past several years. He often treats it by decreasing his fluid intake and salt intake and it resolves.  Laboratory shows total cholesterol 129, LDL 71 EKG shows normal sinus rhythm with rate 62 beats per minute with no significant ST or T wave changes   Outpatient Encounter Prescriptions as of 01/23/2013  Medication Sig Dispense Refill  . acetaminophen (TYLENOL) 325 MG tablet Take 650 mg by mouth 2 (two) times daily as needed.      Marland Kitchen alendronate (FOSAMAX) 70 MG tablet Take 70 mg by mouth every 7 (seven) days. Take with a full glass of water on an empty stomach.      Marland Kitchen aspirin 81 MG tablet Take 81 mg by mouth daily.      . Calcium Carbonate-Vitamin D (CALCIUM + D PO) Take by mouth.      . doxazosin (CARDURA) 4 MG tablet Take 4 mg by mouth at bedtime.      Marland Kitchen lisinopril (PRINIVIL,ZESTRIL) 20 MG tablet Take 10 mg by mouth 2 (two) times daily.      . metoprolol tartrate  (LOPRESSOR) 25 MG tablet Take 25 mg by mouth 2 (two) times daily.      Marland Kitchen omeprazole (PRILOSEC) 20 MG capsule Take 1 capsule (20 mg total) by mouth daily.  30 capsule  3  . Saw Palmetto, Serenoa repens, (SAW PALMETTO PO) Take 2 tablets by mouth 2 (two) times daily.      . simvastatin (ZOCOR) 40 MG tablet Take 20 mg by mouth every evening.      . tolterodine (DETROL LA) 4 MG 24 hr capsule Take 4 mg by mouth daily.        Review of Systems  Constitutional: Negative.   HENT: Negative.   Eyes: Negative.   Respiratory: Negative.   Cardiovascular: Positive for leg swelling.  Gastrointestinal: Negative.   Musculoskeletal: Negative.   Skin: Negative.   Neurological: Negative.   Psychiatric/Behavioral: Negative.   All other systems reviewed and are negative.    BP 110/60  Pulse 62  Ht 5\' 9"  (1.753 m)  Wt 201 lb (91.173 kg)  BMI 29.67 kg/m2  Physical Exam  Nursing note and vitals reviewed. Constitutional: He is oriented to person, place, and time. He appears well-developed and well-nourished.  HENT:  Head: Normocephalic.  Nose: Nose normal.  Mouth/Throat: Oropharynx is clear and moist.  Eyes: Conjunctivae are normal. Pupils are equal, round, and reactive to light.  Neck: Normal range  of motion. Neck supple. No JVD present.  Cardiovascular: Normal rate, regular rhythm, S1 normal, S2 normal, normal heart sounds and intact distal pulses.  Exam reveals no gallop and no friction rub.   No murmur heard. Trace minimal pitting edema bilateral ankle area  Pulmonary/Chest: Effort normal and breath sounds normal. No respiratory distress. He has no wheezes. He has no rales. He exhibits no tenderness.  Abdominal: Soft. Bowel sounds are normal. He exhibits no distension. There is no tenderness.  Musculoskeletal: Normal range of motion. He exhibits no edema and no tenderness.  Lymphadenopathy:    He has no cervical adenopathy.  Neurological: He is alert and oriented to person, place, and time.  Coordination normal.  Skin: Skin is warm and dry. No rash noted. No erythema.  Psychiatric: He has a normal mood and affect. His behavior is normal. Judgment and thought content normal.      Assessment and Plan

## 2013-01-23 NOTE — Assessment & Plan Note (Signed)
No recent symptoms of angina. He reports having records [place] at his house. He will bring these to the office and we will make copies. If surgical report is not available, we will request these from [place].

## 2013-01-23 NOTE — Patient Instructions (Addendum)
You are doing well. No medication changes were made.  Please call us if you have new issues that need to be addressed before your next appt.  Your physician wants you to follow-up in: 6 months.  You will receive a reminder letter in the mail two months in advance. If you don't receive a letter, please call our office to schedule the follow-up appointment.   

## 2013-01-23 NOTE — Assessment & Plan Note (Signed)
We have encouraged continued exercise, careful diet management in an effort to lose weight. 

## 2013-01-23 NOTE — Assessment & Plan Note (Signed)
Cholesterol is at goal on the current lipid regimen. No changes to the medications were made.  

## 2013-01-23 NOTE — Assessment & Plan Note (Signed)
Blood pressure is well controlled on today's visit. No changes made to the medications. 

## 2013-02-04 ENCOUNTER — Encounter: Payer: Self-pay | Admitting: Internal Medicine

## 2013-03-05 ENCOUNTER — Other Ambulatory Visit: Payer: Self-pay | Admitting: Internal Medicine

## 2013-03-05 NOTE — Telephone Encounter (Signed)
Med filled.  

## 2013-04-17 ENCOUNTER — Other Ambulatory Visit (INDEPENDENT_AMBULATORY_CARE_PROVIDER_SITE_OTHER): Payer: Medicare Other

## 2013-04-17 DIAGNOSIS — E669 Obesity, unspecified: Secondary | ICD-10-CM

## 2013-04-17 DIAGNOSIS — R7989 Other specified abnormal findings of blood chemistry: Secondary | ICD-10-CM

## 2013-04-17 DIAGNOSIS — E785 Hyperlipidemia, unspecified: Secondary | ICD-10-CM

## 2013-04-17 LAB — LIPID PANEL
Cholesterol: 121 mg/dL (ref 0–200)
HDL: 34.8 mg/dL — ABNORMAL LOW (ref >39.00)
LDL Cholesterol: 72 mg/dL (ref 0–99)
Total CHOL/HDL Ratio: 3
Triglycerides: 72 mg/dL (ref 0.0–149.0)
VLDL: 14.4 mg/dL (ref 0.0–40.0)

## 2013-04-17 LAB — HEPATIC FUNCTION PANEL
ALT: 28 U/L (ref 0–53)
AST: 30 U/L (ref 0–37)
Albumin: 3.5 g/dL (ref 3.5–5.2)
Alkaline Phosphatase: 46 U/L (ref 39–117)
Bilirubin, Direct: 0.1 mg/dL (ref 0.0–0.3)
Total Bilirubin: 0.5 mg/dL (ref 0.3–1.2)
Total Protein: 6.3 g/dL (ref 6.0–8.3)

## 2013-04-17 LAB — HEMOGLOBIN A1C: Hgb A1c MFr Bld: 5.8 % (ref 4.6–6.5)

## 2013-04-18 ENCOUNTER — Encounter: Payer: Self-pay | Admitting: Internal Medicine

## 2013-04-22 ENCOUNTER — Ambulatory Visit: Payer: Medicare Other | Admitting: Internal Medicine

## 2013-04-26 ENCOUNTER — Other Ambulatory Visit: Payer: Self-pay | Admitting: Internal Medicine

## 2013-05-05 ENCOUNTER — Ambulatory Visit: Payer: Self-pay | Admitting: Internal Medicine

## 2013-05-05 ENCOUNTER — Encounter: Payer: Self-pay | Admitting: Internal Medicine

## 2013-05-05 ENCOUNTER — Ambulatory Visit (INDEPENDENT_AMBULATORY_CARE_PROVIDER_SITE_OTHER): Payer: Medicare Other | Admitting: Internal Medicine

## 2013-05-05 VITALS — BP 128/60 | HR 60 | Temp 97.5°F | Resp 14 | Wt 197.8 lb

## 2013-05-05 DIAGNOSIS — K115 Sialolithiasis: Secondary | ICD-10-CM

## 2013-05-05 NOTE — Progress Notes (Signed)
Patient ID: David Atkins, male   DOB: [birth year], 79 y.o.   MRN: [id number] Patient Active Problem List   Diagnosis Date Noted  . Sialolithiasis of submandibular gland 05/05/2013  . Routine general medical examination at a health care facility 01/13/2013  . GERD (gastroesophageal reflux disease) 01/06/2013  . Pill esophagitis 12/07/2012  . Abnormal CT scan, neck 12/07/2012  . Other and unspecified hyperlipidemia 07/09/2012  . Impaired glucose tolerance 07/09/2012  . Basal cell carcinoma 07/09/2012  . Vertigo, benign paroxysmal 07/09/2012  . Osteoporosis   . BPH (benign prostatic hyperplasia)   . 3-vessel coronary artery disease   . Hypertension   . Myocardial infarction     Subjective:  CC:   Chief Complaint  Patient presents with  . Follow-up    HPI:   David Hutchingsis a 79 y.o. male who presents  Past Medical History  Diagnosis Date  . Osteoporosis 2010    by DEXA at [facility]  . BPH (benign prostatic hyperplasia)   . Hypertension   . Myocardial infarction 2006  . basal cell   . 3-vessel coronary artery disease 2006    [place]    Past Surgical History  Procedure Laterality Date  . Appendectomy  1951  . Nasal septoplasty w/ turbinoplasty  1981  . Coronary artery bypass graft  2006    3 vessel, s/p AMI       The following portions of the patient's history were reviewed and updated as appropriate: Allergies, current medications, and problem list.    Review of Systems:   12 Pt  review of systems was negative except those addressed in the HPI,     History   Social History  . Marital Status: Married    Spouse Name: N/A    Number of Children: N/A  . Years of Education: N/A   Occupational History  . Not on file.   Social History Main Topics  . Smoking status: Former Smoker    Types: Cigarettes    Quit date: 07/10/1963  . Smokeless tobacco: Never Used  . Alcohol Use: No  . Drug Use: No  . Sexually Active: Not on file   Other Topics Concern  .  Not on file   Social History Narrative  . No narrative on file    Objective:  BP 128/60  Pulse 60  Temp(Src) 97.5 F (36.4 C) (Oral)  Resp 14  Wt 197 lb 12 oz (89.699 kg)  BMI 29.19 kg/m2  SpO2 98%  General appearance: alert, cooperative and appears stated age Ears: normal TM's and external ear canals both ears Throat: lips, mucosa, and tongue normal;  gums normal. patiet is edentulate Jay: tenderness along right mandible without LAD or swelling Neck: no adenopathy, no carotid bruit, supple, symmetrical, trachea midline and thyroid not enlarged, symmetric, no tenderness/mass/nodules Back: symmetric, no curvature. ROM normal. No CVA tenderness. Lungs: clear to auscultation bilaterally Heart: regular rate and rhythm, S1, S2 normal, no murmur, click, rub or gallop Abdomen: soft, non-tender; bowel sounds normal; no masses,  no organomegaly Pulses: 2+ and symmetric Skin: Skin color, texture, turgor normal. No rashes or lesions Lymph nodes: Cervical, supraclavicular, and axillary nodes normal.  Assessment and Plan:  Sialolithiasis of submandibular gland Suspected by history of cramping pain right mandible x 1 day. Plain films ordered ,  Supportive care described and ENT referral initiated.     Updated Medication List Outpatient Encounter Prescriptions as of 05/05/2013  Medication Sig Dispense Refill  . acetaminophen (TYLENOL) 325 MG tablet Take  650 mg by mouth 2 (two) times daily as needed.      Marland Kitchen alendronate (FOSAMAX) 70 MG tablet Take 70 mg by mouth every 7 (seven) days. Take with a full glass of water on an empty stomach.      Marland Kitchen aspirin 81 MG tablet Take 81 mg by mouth daily.      . Calcium Carbonate-Vitamin D (CALCIUM + D PO) Take by mouth.      . doxazosin (CARDURA) 4 MG tablet Take 4 mg by mouth at bedtime.      Marland Kitchen lisinopril (PRINIVIL,ZESTRIL) 10 MG tablet TAKE 1 TABLET BY MOUTH TWICE A DAY  90 tablet  2  . lisinopril (PRINIVIL,ZESTRIL) 20 MG tablet Take 10 mg by mouth 2  (two) times daily.      . metoprolol tartrate (LOPRESSOR) 25 MG tablet Take 25 mg by mouth 2 (two) times daily.      Marland Kitchen omeprazole (PRILOSEC) 20 MG capsule TAKE 1 CAPSULE (20 MG TOTAL) BY MOUTH DAILY.  30 capsule  3  . Saw Palmetto, Serenoa repens, (SAW PALMETTO PO) Take 2 tablets by mouth 2 (two) times daily.      . simvastatin (ZOCOR) 40 MG tablet Take 20 mg by mouth every evening.      . tolterodine (DETROL LA) 4 MG 24 hr capsule Take 4 mg by mouth daily.       No facility-administered encounter medications on file as of 05/05/2013.     Orders Placed This Encounter  Procedures  . DG Mandible 4 Views  . Ambulatory referral to ENT    No Follow-up on file.

## 2013-05-05 NOTE — Assessment & Plan Note (Signed)
Suspected by history of cramping pain right mandible x 1 day. Plain films ordered ,  Supportive care described and ENT referral initiated.

## 2013-05-05 NOTE — Patient Instructions (Addendum)
Salivary Stone Your exam shows you have a stone in one of your saliva glands. These small stones form around a mucous plug in the ducts of the glands and cause the saliva in the gland to be blocked. This makes the gland swollen and painful, especially when you eat. If repeated episodes occur, the gland can become infected. Sometimes these stones can be seen on x-ray. Treatment includes stimulating the production of saliva to push the stone out. You should suck on a lemon or sour candies several times daily. Antibiotic medicine may be needed if the gland is infected. Increasing fluids, applying warm compresses to the swollen area 3-4 times daily, and massaging the gland from back to front may encourage drainage and passage of the stone. Surgical treatment to remove the stone is sometimes necessary, so proper medical follow up is very important. Call your doctor for an appointment as recommended. Call right away if you have a high fever, severe headache, vomiting, uncontrolled pain, or other serious symptoms. Document Released: 12/28/2004 Document Revised: 02/12/2012 Document Reviewed: 11/20/2005 [facility] Patient Information 2014 [place], [place].   I am ordering an x ray of your jaw, and will refer you to ENT (Dr Mikey Bussing office) for further evaluation

## 2013-05-06 ENCOUNTER — Telehealth: Payer: Self-pay | Admitting: Internal Medicine

## 2013-05-06 NOTE — Telephone Encounter (Signed)
mandible films were normal,  No stones seen,  Keep ENT appt

## 2013-05-06 NOTE — Telephone Encounter (Signed)
Patient notified as requested by phone. 

## 2013-05-26 ENCOUNTER — Encounter: Payer: Self-pay | Admitting: Adult Health

## 2013-05-26 ENCOUNTER — Ambulatory Visit (INDEPENDENT_AMBULATORY_CARE_PROVIDER_SITE_OTHER): Payer: Medicare Other | Admitting: Adult Health

## 2013-05-26 VITALS — BP 96/56 | HR 79 | Temp 97.7°F | Resp 12 | Wt 193.0 lb

## 2013-05-26 DIAGNOSIS — R300 Dysuria: Secondary | ICD-10-CM | POA: Insufficient documentation

## 2013-05-26 LAB — BASIC METABOLIC PANEL
BUN: 25 mg/dL — ABNORMAL HIGH (ref 6–23)
CO2: 26 mEq/L (ref 19–32)
Calcium: 8.6 mg/dL (ref 8.4–10.5)
Chloride: 107 mEq/L (ref 96–112)
Creatinine, Ser: 1.4 mg/dL (ref 0.4–1.5)
GFR: 50.31 mL/min — ABNORMAL LOW (ref >60.00)
Glucose, Bld: 92 mg/dL (ref 70–99)
Potassium: 4 mEq/L (ref 3.5–5.1)
Sodium: 138 mEq/L (ref 135–145)

## 2013-05-26 LAB — POCT URINALYSIS DIPSTICK
Bilirubin, UA: NEGATIVE
Glucose, UA: NEGATIVE
Ketones, UA: NEGATIVE
Nitrite, UA: POSITIVE
Protein, UA: >=300
Spec Grav, UA: 1.02
Urobilinogen, UA: 0.2
pH, UA: 6

## 2013-05-26 LAB — CBC WITH DIFFERENTIAL/PLATELET
Basophils Absolute: 0 10*3/uL (ref 0.0–0.1)
Basophils Relative: 0 % (ref 0.0–3.0)
Eosinophils Absolute: 0 10*3/uL (ref 0.0–0.7)
Eosinophils Relative: 0.3 % (ref 0.0–5.0)
HCT: 42.6 % (ref 39.0–52.0)
Hemoglobin: 14.2 g/dL (ref 13.0–17.0)
Lymphocytes Relative: 6.9 % — ABNORMAL LOW (ref 12.0–46.0)
Lymphs Abs: 1 10*3/uL (ref 0.7–4.0)
MCHC: 33.4 g/dL (ref 30.0–36.0)
MCV: 97 fl (ref 78.0–100.0)
Monocytes Absolute: 1.2 10*3/uL — ABNORMAL HIGH (ref 0.1–1.0)
Monocytes Relative: 8.1 % (ref 3.0–12.0)
Neutro Abs: 12.7 10*3/uL — ABNORMAL HIGH (ref 1.4–7.7)
Neutrophils Relative %: 84.7 % — ABNORMAL HIGH (ref 43.0–77.0)
Platelets: 178 10*3/uL (ref 150.0–400.0)
RBC: 4.39 Mil/uL (ref 4.22–5.81)
RDW: 13.2 % (ref 11.5–14.6)
WBC: 15 10*3/uL — ABNORMAL HIGH (ref 4.5–10.5)

## 2013-05-26 MED ORDER — PHENAZOPYRIDINE HCL 100 MG PO TABS: ORAL_TABLET | ORAL | Status: DC

## 2013-05-26 MED ORDER — MECLIZINE HCL 12.5 MG PO TABS: 12.5000 mg | ORAL_TABLET | Freq: Three times a day (TID) | ORAL | Status: DC | PRN

## 2013-05-26 MED ORDER — LEVOFLOXACIN 500 MG PO TABS: 500.0000 mg | ORAL_TABLET | Freq: Every day | ORAL | Status: DC

## 2013-05-26 NOTE — Patient Instructions (Addendum)
  Please have your labs drawn prior to leaving the office.  We will contact you with the results once they are available.  You have a urinary tract infection.  I am starting you on Levaquin 500 mg daily for 10 days. Please drink fluids.  I have also sent in a prescription for Pyridium. This will help your urinary symptoms. You will take 1 tablet twice a day for 3 days.  For your vertigo, I suspect this is partially secondary to not drinking enough fluid and dehydration. Continue to increase your fluid intake and you may take meclizine for the vertigo

## 2013-05-26 NOTE — Assessment & Plan Note (Signed)
UA dipstick shows blood, positive nitrites and large amount of leukocytes. Start Levaquin 500 mg daily x10 days. May also take Pyridium 100 mg 2 times daily x2 - 3 days as needed for discomfort. Send urine for culture. Check CBC and metabolic panel given the patient's reports of not eating or drinking over the weekend and vertigo. Patient reports that he will be able to drink fluids at home.

## 2013-05-26 NOTE — Progress Notes (Signed)
  Subjective:    Patient ID: David Atkins, male    DOB: 05/19/1929, 79 y.o.   MRN: [id number]  HPI  Patient is a pleasant 79 y/o male who presents with dysuria, frequency and urgency. He reports having temp 100 yesterday. He reports that last Friday he works outside in the yard without drinking much fluid. He felt vertigo that evening and took meclizine with improvement. Patient reports that he spent most of the weekend sleeping and not feeling his usual self. On Sunday patient began to experience dysuria, urgency and frequency. He denies fever today, he does not have any chills. His blood pressure is low this morning. He has taken his blood pressure medication.   Current Outpatient Prescriptions on File Prior to Visit  Medication Sig Dispense Refill  . acetaminophen (TYLENOL) 325 MG tablet Take 650 mg by mouth 2 (two) times daily as needed.      Marland Kitchen alendronate (FOSAMAX) 70 MG tablet Take 70 mg by mouth every 7 (seven) days. Take with a full glass of water on an empty stomach.      Marland Kitchen aspirin 81 MG tablet Take 81 mg by mouth daily.      . Calcium Carbonate-Vitamin D (CALCIUM + D PO) Take by mouth.      . doxazosin (CARDURA) 4 MG tablet Take 4 mg by mouth at bedtime.      Marland Kitchen lisinopril (PRINIVIL,ZESTRIL) 20 MG tablet Take 10 mg by mouth 2 (two) times daily.      . metoprolol tartrate (LOPRESSOR) 25 MG tablet Take 25 mg by mouth 2 (two) times daily.      Marland Kitchen omeprazole (PRILOSEC) 20 MG capsule TAKE 1 CAPSULE (20 MG TOTAL) BY MOUTH DAILY.  30 capsule  3  . Saw Palmetto, Serenoa repens, (SAW PALMETTO PO) Take 2 tablets by mouth 2 (two) times daily.      . simvastatin (ZOCOR) 40 MG tablet Take 20 mg by mouth every evening.      . tolterodine (DETROL LA) 4 MG 24 hr capsule Take 4 mg by mouth daily.       No current facility-administered medications on file prior to visit.     Review of Systems  Constitutional: Negative for fever and chills.  Respiratory: Negative.   Cardiovascular: Negative.    Genitourinary: Positive for dysuria, urgency and frequency. Negative for hematuria and flank pain.  Neurological: Negative for light-headedness and headaches.       Vertigo  Psychiatric/Behavioral: Negative.     BP 96/56  Pulse 79  Temp(Src) 97.7 F (36.5 C) (Oral)  Resp 12  Wt 193 lb (87.544 kg)  BMI 28.49 kg/m2  SpO2 99%    Objective:   Physical Exam  Constitutional: He is oriented to person, place, and time. He appears well-developed and well-nourished. No distress.  HENT:  Head: Normocephalic and atraumatic.  Cardiovascular: Normal rate, regular rhythm and normal heart sounds.  Exam reveals no gallop.   No murmur heard. Pulmonary/Chest: Effort normal and breath sounds normal. No respiratory distress. He has no wheezes. He has no rales.  Abdominal: Soft. Bowel sounds are normal.  Genitourinary:  Suprapubic tenderness  Neurological: He is alert and oriented to person, place, and time.  Skin: Skin is warm and dry.  Psychiatric: He has a normal mood and affect. His behavior is normal. Judgment and thought content normal.          Assessment & Plan:

## 2013-05-28 LAB — URINE CULTURE: Colony Count: >=100000

## 2013-06-05 ENCOUNTER — Encounter: Payer: Self-pay | Admitting: Adult Health

## 2013-06-05 ENCOUNTER — Other Ambulatory Visit: Payer: Self-pay | Admitting: Internal Medicine

## 2013-06-05 ENCOUNTER — Telehealth: Payer: Self-pay | Admitting: *Deleted

## 2013-06-05 ENCOUNTER — Ambulatory Visit (INDEPENDENT_AMBULATORY_CARE_PROVIDER_SITE_OTHER): Payer: Medicare Other | Admitting: Adult Health

## 2013-06-05 VITALS — BP 100/50 | HR 66 | Temp 97.9°F | Resp 12 | Wt 195.0 lb

## 2013-06-05 DIAGNOSIS — N419 Inflammatory disease of prostate, unspecified: Secondary | ICD-10-CM

## 2013-06-05 DIAGNOSIS — R350 Frequency of micturition: Secondary | ICD-10-CM

## 2013-06-05 DIAGNOSIS — N411 Chronic prostatitis: Secondary | ICD-10-CM | POA: Insufficient documentation

## 2013-06-05 LAB — POCT URINALYSIS DIPSTICK
Bilirubin, UA: NEGATIVE
Blood, UA: NEGATIVE
Glucose, UA: NEGATIVE
Leukocytes, UA: NEGATIVE
Nitrite, UA: NEGATIVE
Protein, UA: NEGATIVE
Spec Grav, UA: 1.015
Urobilinogen, UA: 0.2
pH, UA: 6

## 2013-06-05 MED ORDER — LEVOFLOXACIN 500 MG PO TABS: ORAL_TABLET | ORAL | Status: DC

## 2013-06-05 NOTE — Assessment & Plan Note (Signed)
Recently treated for UTI with Levaquin 500 mg x 10 days. Will add 7 more days of treatment. Prostate exam revealed no pain, induration. Prostate was not boggy. Given his symptoms and hx of chronic prostatitis, I am referring patient to urology for evaluation and management.

## 2013-06-05 NOTE — Progress Notes (Signed)
Subjective:    Patient ID: David Atkins, male    DOB: [birth year], 79 y.o.   MRN: [id number]  HPI  Patient is a pleasant 79 year old male who presents to clinic with complaint of having "prostate problems". Patient reports having a history of nonbacterial chronic prostatitis.  Patient was recently seen in clinic at the end of June for a urinary tract infection. He was treated with Levaquin 500 mg daily x10 days. Patient reports having a burning sensation when he lies down. He is having frequency and is getting up to urinate approximately every hour during the night. He denies blood in his urine, fever, hesitancy. Patient reports having these same symptoms approximately 4 years ago when he was diagnosed with prostatitis. He is taking saw palmetto and has been for years. He reports having a complete work up approximately 4 years ago without any evidence of cancer. He has not seen a urologist in approximately 4 years.   Current Outpatient Prescriptions on File Prior to Visit  Medication Sig Dispense Refill  . acetaminophen (TYLENOL) 325 MG tablet Take 650 mg by mouth 2 (two) times daily as needed.      Marland Kitchen alendronate (FOSAMAX) 70 MG tablet Take 70 mg by mouth every 7 (seven) days. Take with a full glass of water on an empty stomach.      Marland Kitchen aspirin 81 MG tablet Take 81 mg by mouth daily.      . Calcium Carbonate-Vitamin D (CALCIUM + D PO) Take by mouth.      . doxazosin (CARDURA) 4 MG tablet Take 4 mg by mouth at bedtime.      Marland Kitchen lisinopril (PRINIVIL,ZESTRIL) 20 MG tablet Take 10 mg by mouth 2 (two) times daily.      . meclizine (ANTIVERT) 12.5 MG tablet Take 1 tablet (12.5 mg total) by mouth 3 (three) times daily as needed.  30 tablet  0  . metoprolol tartrate (LOPRESSOR) 25 MG tablet Take 25 mg by mouth 2 (two) times daily.      Marland Kitchen omeprazole (PRILOSEC) 20 MG capsule TAKE 1 CAPSULE (20 MG TOTAL) BY MOUTH DAILY.  30 capsule  3  . phenazopyridine (PYRIDIUM) 100 MG tablet Take 1 tablet two times daily  for 3 days.  6 tablet  0  . Saw Palmetto, Serenoa repens, (SAW PALMETTO PO) Take 2 tablets by mouth 2 (two) times daily.      . simvastatin (ZOCOR) 40 MG tablet Take 20 mg by mouth every evening.      . tolterodine (DETROL LA) 4 MG 24 hr capsule Take 4 mg by mouth daily.       No current facility-administered medications on file prior to visit.     Review of Systems  Constitutional: Negative.   Respiratory: Negative.   Cardiovascular: Negative.   Gastrointestinal: Negative.   Genitourinary: Positive for frequency. Negative for dysuria, urgency, hematuria, flank pain, decreased urine volume, scrotal swelling, difficulty urinating and testicular pain.       Reports pain in the perineum "deep inside where the prostate is".  Neurological: Negative.   Psychiatric/Behavioral: Negative.        Objective:   Physical Exam  Constitutional: He appears well-developed and well-nourished. No distress.  Cardiovascular: Normal rate and regular rhythm.   Pulmonary/Chest: Effort normal. No respiratory distress.  Abdominal: Soft. Bowel sounds are normal. He exhibits no distension and no mass. There is no tenderness. There is no rebound and no guarding.  Genitourinary: Prostate normal. Prostate is not enlarged and not  tender.  Patient exhibits no pain during prostate exam.          Assessment & Plan:

## 2013-06-05 NOTE — Patient Instructions (Addendum)
  I am referring you to Urology.  The office will call you with an appointment.  Please start Levaquin 500 mg daily for 7 more days.  I am sending the urine for culture.

## 2013-06-05 NOTE — Telephone Encounter (Signed)
Would you like a urine culture?  

## 2013-06-05 NOTE — Telephone Encounter (Signed)
yes

## 2013-06-07 LAB — URINE CULTURE
Colony Count: NO GROWTH
Organism ID, Bacteria: NO GROWTH

## 2013-06-17 ENCOUNTER — Encounter: Payer: Self-pay | Admitting: Internal Medicine

## 2013-07-09 ENCOUNTER — Other Ambulatory Visit: Payer: Self-pay

## 2013-07-23 ENCOUNTER — Other Ambulatory Visit: Payer: Self-pay | Admitting: Internal Medicine

## 2013-07-25 ENCOUNTER — Other Ambulatory Visit: Payer: Self-pay | Admitting: *Deleted

## 2013-07-25 MED ORDER — METOPROLOL TARTRATE 25 MG PO TABS: 25.0000 mg | ORAL_TABLET | Freq: Two times a day (BID) | ORAL | Status: DC

## 2013-07-28 ENCOUNTER — Other Ambulatory Visit: Payer: Self-pay | Admitting: *Deleted

## 2013-07-29 MED ORDER — ALENDRONATE SODIUM 70 MG PO TABS: 70.0000 mg | ORAL_TABLET | ORAL | Status: DC

## 2013-10-09 ENCOUNTER — Other Ambulatory Visit: Payer: Self-pay

## 2013-10-10 ENCOUNTER — Other Ambulatory Visit: Payer: Self-pay | Admitting: Internal Medicine

## 2013-10-15 ENCOUNTER — Ambulatory Visit (INDEPENDENT_AMBULATORY_CARE_PROVIDER_SITE_OTHER): Payer: Medicare Other | Admitting: Internal Medicine

## 2013-10-15 ENCOUNTER — Ambulatory Visit (INDEPENDENT_AMBULATORY_CARE_PROVIDER_SITE_OTHER)
Admission: RE | Admit: 2013-10-15 | Discharge: 2013-10-15 | Disposition: A | Discharge status: Home / Self Care | Payer: Medicare Other | Source: Ambulatory Visit | Attending: Internal Medicine | Admitting: Internal Medicine

## 2013-10-15 ENCOUNTER — Encounter: Payer: Self-pay | Admitting: Internal Medicine

## 2013-10-15 VITALS — BP 138/72 | HR 60 | Temp 98.0°F | Resp 12 | Ht 69.0 in | Wt 202.0 lb

## 2013-10-15 DIAGNOSIS — L259 Unspecified contact dermatitis, unspecified cause: Secondary | ICD-10-CM | POA: Insufficient documentation

## 2013-10-15 DIAGNOSIS — R7302 Impaired glucose tolerance (oral): Secondary | ICD-10-CM

## 2013-10-15 DIAGNOSIS — G8929 Other chronic pain: Secondary | ICD-10-CM

## 2013-10-15 DIAGNOSIS — I10 Essential (primary) hypertension: Secondary | ICD-10-CM

## 2013-10-15 DIAGNOSIS — M25559 Pain in unspecified hip: Secondary | ICD-10-CM

## 2013-10-15 DIAGNOSIS — R7309 Other abnormal glucose: Secondary | ICD-10-CM

## 2013-10-15 DIAGNOSIS — Z79899 Other long term (current) drug therapy: Secondary | ICD-10-CM

## 2013-10-15 DIAGNOSIS — M25551 Pain in right hip: Secondary | ICD-10-CM

## 2013-10-15 DIAGNOSIS — E785 Hyperlipidemia, unspecified: Secondary | ICD-10-CM

## 2013-10-15 DIAGNOSIS — N400 Enlarged prostate without lower urinary tract symptoms: Secondary | ICD-10-CM

## 2013-10-15 DIAGNOSIS — Z23 Encounter for immunization: Secondary | ICD-10-CM

## 2013-10-15 LAB — COMPREHENSIVE METABOLIC PANEL
ALT: 26 U/L (ref 0–53)
AST: 27 U/L (ref 0–37)
Albumin: 3.8 g/dL (ref 3.5–5.2)
Alkaline Phosphatase: 55 U/L (ref 39–117)
BUN: 17 mg/dL (ref 6–23)
CO2: 27 mEq/L (ref 19–32)
Calcium: 9.2 mg/dL (ref 8.4–10.5)
Chloride: 105 mEq/L (ref 96–112)
Creatinine, Ser: 0.9 mg/dL (ref 0.4–1.5)
GFR: 82.59 mL/min (ref >60.00)
Glucose, Bld: 91 mg/dL (ref 70–99)
Potassium: 4.3 mEq/L (ref 3.5–5.1)
Sodium: 139 mEq/L (ref 135–145)
Total Bilirubin: 0.7 mg/dL (ref 0.3–1.2)
Total Protein: 6.8 g/dL (ref 6.0–8.3)

## 2013-10-15 NOTE — Progress Notes (Signed)
Patient ID: David Atkins, male   DOB: February 13, 1929, 79 y.o.   MRN: [id number]  Patient Active Problem List   Diagnosis Date Noted  . Right hip pain 10/16/2013  . Contact eczema 10/15/2013  . Prostatitis 06/05/2013  . Sialolithiasis of submandibular gland 05/05/2013  . Routine general medical examination at a health care facility 01/13/2013  . GERD (gastroesophageal reflux disease) 01/06/2013  . Pill esophagitis 12/07/2012  . Abnormal CT scan, neck 12/07/2012  . Other and unspecified hyperlipidemia 07/09/2012  . Impaired glucose tolerance 07/09/2012  . Basal cell carcinoma 07/09/2012  . Vertigo, benign paroxysmal 07/09/2012  . Osteoporosis   . BPH (benign prostatic hyperplasia)   . 3-vessel coronary artery disease   . Hypertension   . Myocardial infarction     Subjective:  CC:   Chief Complaint  Patient presents with  . Back Pain    lower left  back and hip pain.Small papule on right elbow.    HPI:   Kden Hutchingsis a 79 y.o. male who presents History of twisting accident last year doing yard work "something slipped out of the socket" but slipped right back in  Persistent pain in the left sacro iliac area. For the past several weeks . Had another fall 6 weeks ago while walking his dog,  Dog spun him around and he lost  His balance .  Feels like his balance is not as good as it used to be.  Stiffens up at night,  Worse in the morning.,  Takes one ibuprofen and it looses up occasionally radiates down the left latera leg but not always.    Straight leg lift causes pain in lateral hip and is joint,   Internal and external rotation are fine.   Dry chapped lips, starting after seeing urologist for prostatitis and bladder problems, who started him on a new medication tamsulosin and stopped doxasozin.  Patient is still taking detrol , not sure if he is supposed to. Has not contacted Dr Achilles Dunk .  Having difficult time chewing and swallowing since he had all his teeth pulled and new  dentures made. Has not been back to dentist to have them refitted ,     Past Medical History  Diagnosis Date  . Osteoporosis 2010    by DEXA at [facility]  . BPH (benign prostatic hyperplasia)   . Hypertension   . Myocardial infarction 2006  . basal cell   . 3-vessel coronary artery disease 2006    [place]    Past Surgical History  Procedure Laterality Date  . Appendectomy  1951  . Nasal septoplasty w/ turbinoplasty  1981  . Coronary artery bypass graft  2006    3 vessel, s/p AMI       The following portions of the patient's history were reviewed and updated as appropriate: Allergies, current medications, and problem list.    Review of Systems:   12 Pt  review of systems was negative except those addressed in the HPI,     History   Social History  . Marital Status: Married    Spouse Name: N/A    Number of Children: N/A  . Years of Education: N/A   Occupational History  . Not on file.   Social History Main Topics  . Smoking status: Former Smoker    Types: Cigarettes    Quit date: 07/10/1963  . Smokeless tobacco: Never Used  . Alcohol Use: No  . Drug Use: No  . Sexual Activity: Not on file  Other Topics Concern  . Not on file   Social History Narrative  . No narrative on file    Objective:  Filed Vitals:   10/15/13 1050  BP: 138/72  Pulse: 60  Temp: 98 F (36.7 C)  Resp: 12     General appearance: alert, cooperative and appears stated age Ears: normal TM's and external ear canals both ears Throat: lips, mucosa, and tongue normal; teeth and gums normal Neck: no adenopathy, no carotid bruit, supple, symmetrical, trachea midline and thyroid not enlarged, symmetric, no tenderness/mass/nodules Back: symmetric, no curvature. ROM normal. No CVA tenderness. Lungs: clear to auscultation bilaterally Heart: regular rate and rhythm, S1, S2 normal, no murmur, click, rub or gallop Abdomen: soft, non-tender; bowel sounds normal; no masses,  no  organomegaly Pulses: 2+ and symmetric Skin: Skin color, texture, turgor normal. No rashes or lesions Lymph nodes: Cervical, supraclavicular, and axillary nodes normal.  Assessment and Plan:  Hypertension Well controlled on current regimen. Renal function stable, no changes today.  Lab Results  Component Value Date   CREATININE 0.9 10/15/2013    BPH (benign prostatic hyperplasia) Now taking flomax per Dr Achilles Dunk . Reassured that his chapped lips were not likely a side effect of medication. Continue Detrol if it is helping his frequent urination  unless directed to stop by Dr. Achilles Dunk   Impaired glucose tolerance His fasting glucoses have been below the range of diabetes diagnosis per labs sent by the [facility]. Lab Results  Component Value Date   HGBA1C 5.8 04/17/2013       Other and unspecified hyperlipidemia Managed with zocor .  LFTs normal today Lab Results  Component Value Date   ALT 26 10/15/2013   AST 27 10/15/2013   ALKPHOS 55 10/15/2013   BILITOT 0.7 10/15/2013   Lab Results  Component Value Date   CHOL 121 04/17/2013   HDL 34.80* 04/17/2013   LDLCALC 72 04/17/2013   TRIG 72.0 04/17/2013   CHOLHDL 3 04/17/2013    Right hip pain Normal hip joint by exam, confirmed with plain filns.  Lumbar spine films suggest radicular pain from DDD. Trial of NSAIDs,  Normal Cr.   A total of 40 minutes was spent with patient more than half of which was spent in counseling, reviewing records from other prviders and coordination of care.   Updated Medication List Outpatient Encounter Prescriptions as of 10/15/2013  Medication Sig  . acetaminophen (TYLENOL) 325 MG tablet Take 650 mg by mouth 2 (two) times daily as needed.  Marland Kitchen alendronate (FOSAMAX) 70 MG tablet Take 1 tablet (70 mg total) by mouth every 7 (seven) days. Take with a full glass of water on an empty stomach.  Marland Kitchen aspirin 81 MG tablet Take 81 mg by mouth daily.  . Calcium Carbonate-Vitamin D (CALCIUM + D PO) Take by mouth.   Marland Kitchen lisinopril (PRINIVIL,ZESTRIL) 10 MG tablet TAKE 1 TABLET BY MOUTH TWICE A DAY  . meclizine (ANTIVERT) 12.5 MG tablet Take 1 tablet (12.5 mg total) by mouth 3 (three) times daily as needed.  . metoprolol tartrate (LOPRESSOR) 25 MG tablet Take 1 tablet (25 mg total) by mouth 2 (two) times daily.  Marland Kitchen omeprazole (PRILOSEC) 20 MG capsule TAKE 1 CAPSULE (20 MG TOTAL) BY MOUTH DAILY.  . Saw Palmetto, Serenoa repens, (SAW PALMETTO PO) Take 2 tablets by mouth 2 (two) times daily.  . simvastatin (ZOCOR) 20 MG tablet TAKE 1 TABLET BY MOUTH AT BEDTIME  . tolterodine (DETROL LA) 4 MG 24 hr capsule Take  4 mg by mouth daily.  . simvastatin (ZOCOR) 40 MG tablet Take 20 mg by mouth every evening.  . [DISCONTINUED] doxazosin (CARDURA) 4 MG tablet Take 4 mg by mouth at bedtime.  . [DISCONTINUED] levofloxacin (LEVAQUIN) 500 MG tablet Take 1 tablet daily for 7 days.  . [DISCONTINUED] lisinopril (PRINIVIL,ZESTRIL) 20 MG tablet Take 10 mg by mouth 2 (two) times daily.  . [DISCONTINUED] omeprazole (PRILOSEC) 20 MG capsule TAKE 1 CAPSULE (20 MG TOTAL) BY MOUTH DAILY.  . [DISCONTINUED] phenazopyridine (PYRIDIUM) 100 MG tablet Take 1 tablet two times daily for 3 days.     Orders Placed This Encounter  Procedures  . DG Lumbar Spine Complete  . DG Hip Complete Left  . Comp Met (CMET)    No Follow-up on file.

## 2013-10-15 NOTE — Progress Notes (Signed)
Pre-visit discussion using our clinic review tool. No additional management support is needed unless otherwise documented below in the visit note.  

## 2013-10-15 NOTE — Patient Instructions (Addendum)
You can take 2 or 3 ibuprofen every 8 hours IF NEEDED for hip pain  You can combine with tylenol (acetominophen) 500 mg every 8 hours   Plain films to be doen at [employer] office  The cyst on your right elbow may be a wart or may be eczema/psoriasis.  Make sure you show it to our dermatologist at the [facility]  You can try applying a warm washcloth followed by your eczema cream  For a few days   Your lips are very chapped,.  Try using vaseline on them and stop licking them!

## 2013-10-16 ENCOUNTER — Encounter: Payer: Self-pay | Admitting: Internal Medicine

## 2013-10-16 DIAGNOSIS — M25551 Pain in right hip: Secondary | ICD-10-CM | POA: Insufficient documentation

## 2013-10-16 NOTE — Assessment & Plan Note (Signed)
Now taking flomax per Dr Achilles Dunk . Reassured that his chapped lips were not likely a side effect of medication. Continue Detrol if it is helping his frequent urination  unless directed to stop by Dr. Achilles Dunk

## 2013-10-16 NOTE — Assessment & Plan Note (Signed)
Managed with zocor .  LFTs normal today Lab Results  Component Value Date   ALT 26 10/15/2013   AST 27 10/15/2013   ALKPHOS 55 10/15/2013   BILITOT 0.7 10/15/2013   Lab Results  Component Value Date   CHOL 121 04/17/2013   HDL 34.80* 04/17/2013   LDLCALC 72 04/17/2013   TRIG 72.0 04/17/2013   CHOLHDL 3 04/17/2013

## 2013-10-16 NOTE — Assessment & Plan Note (Signed)
His fasting glucoses have been below the range of diabetes diagnosis per labs sent by the [facility] hospital. Lab Results  Component Value Date   HGBA1C 5.8 04/17/2013

## 2013-10-16 NOTE — Assessment & Plan Note (Signed)
Well controlled on current regimen. Renal function stable, no changes today.  Lab Results  Component Value Date   CREATININE 0.9 10/15/2013

## 2013-10-16 NOTE — Assessment & Plan Note (Addendum)
Normal hip joint by exam, confirmed with plain filns.  Lumbar spine films suggest radicular pain from DDD. Trial of NSAIDs,  Normal Cr.

## 2013-11-06 ENCOUNTER — Encounter: Payer: Self-pay | Admitting: Emergency Medicine

## 2013-11-06 ENCOUNTER — Telehealth: Payer: Self-pay | Admitting: Internal Medicine

## 2013-11-06 DIAGNOSIS — C4491 Basal cell carcinoma of skin, unspecified: Secondary | ICD-10-CM

## 2013-11-06 DIAGNOSIS — H8193 Unspecified disorder of vestibular function, bilateral: Secondary | ICD-10-CM

## 2013-11-06 NOTE — Telephone Encounter (Signed)
Amber see patient's comments as recorded by Lupita Leash

## 2013-11-06 NOTE — Telephone Encounter (Signed)
Pt states he would like to be referred to a good dermatologist.  States he has spoken with Dr. Darrick Huntsman about this previously.  He would like to be seen before the end of the year if possible, but cannot go on 12/8 or 12/15.  Pt would also like to be referred to physical therapist who is certified to work with older people with balance problems.  He would like this appt to be after the first of the year.  Will be available anytime.  Also states he has spoken with Dr. Darrick Huntsman about this previously.

## 2013-11-07 ENCOUNTER — Encounter: Payer: Self-pay | Admitting: Internal Medicine

## 2013-11-07 ENCOUNTER — Ambulatory Visit (INDEPENDENT_AMBULATORY_CARE_PROVIDER_SITE_OTHER): Payer: Medicare Other | Admitting: Internal Medicine

## 2013-11-07 VITALS — BP 104/64 | HR 66 | Temp 98.1°F | Resp 12 | Ht 69.0 in | Wt 201.0 lb

## 2013-11-07 DIAGNOSIS — K1370 Unspecified lesions of oral mucosa: Secondary | ICD-10-CM

## 2013-11-07 DIAGNOSIS — J069 Acute upper respiratory infection, unspecified: Secondary | ICD-10-CM

## 2013-11-07 DIAGNOSIS — K1379 Other lesions of oral mucosa: Secondary | ICD-10-CM | POA: Insufficient documentation

## 2013-11-07 MED ORDER — CHLORHEXIDINE GLUCONATE 0.12 % MT SOLN: 15.0000 mL | Freq: Two times a day (BID) | OROMUCOSAL | Status: DC

## 2013-11-07 MED ORDER — LIDOCAINE VISCOUS 2 % MT SOLN: 20.0000 mL | OROMUCOSAL | Status: DC | PRN

## 2013-11-07 MED ORDER — HYDROCODONE-ACETAMINOPHEN 5-325 MG PO TABS: 1.0000 | ORAL_TABLET | Freq: Four times a day (QID) | ORAL | Status: DC | PRN

## 2013-11-07 MED ORDER — AMOXICILLIN-POT CLAVULANATE 875-125 MG PO TABS: 1.0000 | ORAL_TABLET | Freq: Two times a day (BID) | ORAL | Status: DC

## 2013-11-07 NOTE — Progress Notes (Signed)
Patient ID: David Atkins, male   DOB: 22-Jul-1929, 79 y.o.   MRN: [id number]   Patient Active Problem List   Diagnosis Date Noted  . Acute URI 11/08/2013  . Acute pain of mouth 11/07/2013  . Right hip pain 10/16/2013  . Contact eczema 10/15/2013  . Prostatitis 06/05/2013  . Sialolithiasis of submandibular gland 05/05/2013  . Routine general medical examination at a health care facility 01/13/2013  . GERD (gastroesophageal reflux disease) 01/06/2013  . Pill esophagitis 12/07/2012  . Abnormal CT scan, neck 12/07/2012  . Other and unspecified hyperlipidemia 07/09/2012  . Impaired glucose tolerance 07/09/2012  . Basal cell carcinoma 07/09/2012  . Vertigo, benign paroxysmal 07/09/2012  . Osteoporosis   . BPH (benign prostatic hyperplasia)   . 3-vessel coronary artery disease   . Hypertension   . Myocardial infarction     Subjective:  CC:   Chief Complaint  Patient presents with  . Acute Visit    Pressure and pain around nasal area underneath and bilateral and under eyes    HPI:   David Hutchingsis a 79 y.o. male who presents Mouth pain.  [place] to [place] fo holidays.  Developed a severe cough and was treated at [facility] for "a cold" with mucinex and generic vicodin for cough.   ,  Lungs were clear   4 nights ago while putting i nhis dentures he develop right sided nasal pain and gum pain above his right inisor which has worsened despite using tylenol,  Not sleeping due to pain . Pain started in the gum and has spread to maxillary sinus.  Having some green sinus discharge ,  No headaches or fevers.  His dentures have been ill fitting despite various refittings and alignments.   Cerumen impaction on exam No maxillary sinus tenderness Edentulate,  Tender over gum, and mandible    Past Medical History  Diagnosis Date  . Osteoporosis 2010    by DEXA at [facility]  . BPH (benign prostatic hyperplasia)   . Hypertension   . Myocardial infarction 2006  . basal cell    . 3-vessel coronary artery disease 2006    [place]    Past Surgical History  Procedure Laterality Date  . Appendectomy  1951  . Nasal septoplasty w/ turbinoplasty  1981  . Coronary artery bypass graft  2006    3 vessel, s/p AMI       The following portions of the patient's history were reviewed and updated as appropriate: Allergies, current medications, and problem list.    Review of Systems:   12 Pt  review of systems was negative except those addressed in the HPI,     History   Social History  . Marital Status: Married    Spouse Name: N/A    Number of Children: N/A  . Years of Education: N/A   Occupational History  . Not on file.   Social History Main Topics  . Smoking status: Former Smoker    Types: Cigarettes    Quit date: 07/10/1963  . Smokeless tobacco: Never Used  . Alcohol Use: No  . Drug Use: No  . Sexual Activity: Not on file   Other Topics Concern  . Not on file   Social History Narrative  . No narrative on file    Objective:  Filed Vitals:   11/07/13 1033  BP: 104/64  Pulse: 66  Temp: 98.1 F (36.7 C)  Resp: 12     General appearance: alert, cooperative and appears stated  age ENT: normal TM's and external ear canals both ears. No maxillary sinus tenderness. Throat: lips, mucosa, and tongue normal; edentulate , gums normal Neck: no adenopathy, no carotid bruit, supple, symmetrical, trachea midline and thyroid not enlarged, symmetric, no tenderness/mass/nodules Back: symmetric, no curvature. ROM normal. No CVA tenderness. Lungs: clear to auscultation bilaterally Heart: regular rate and rhythm, S1, S2 normal, no murmur, click, rub or gallop Abdomen: soft, non-tender; bowel sounds normal; no masses,  no organomegaly Pulses: 2+ and symmetric Skin: Skin color, texture, turgor normal. No rashes or lesions Lymph nodes: Cervical, supraclavicular, and axillary nodes normal.  Assessment and Plan:  Acute pain of mouth Right maxillary  over front incisor. Pantograms ordered.  Wil treat with peridex and viscous lidocaine/  Acute URI Viral syndrome likely.  This URI is most likely viral given the failure of mild HEENT  symptoms to resolve after a z pack.  I have explained that in viral URIS, an antibiotic will not help the symptoms and will increase the risk of developing diarrhea.,  Continue oral and nasal decongestants,  Ibuprofen 400 mg and tylenol 650 mq 8 hrs for aches and pains,  And will addtessalon 100 mg every 8 hours prn cough  And seconda round of abx only if symptoms worsen to include fevers, facial pain, purulent sputum./drainage.    Updated Medication List Outpatient Encounter Prescriptions as of 11/07/2013  Medication Sig  . acetaminophen (TYLENOL) 325 MG tablet Take 650 mg by mouth 2 (two) times daily as needed.  Marland Kitchen alendronate (FOSAMAX) 70 MG tablet Take 1 tablet (70 mg total) by mouth every 7 (seven) days. Take with a full glass of water on an empty stomach.  Marland Kitchen aspirin 81 MG tablet Take 81 mg by mouth daily.  . Calcium Carbonate-Vitamin D (CALCIUM + D PO) Take by mouth.  Marland Kitchen lisinopril (PRINIVIL,ZESTRIL) 10 MG tablet TAKE 1 TABLET BY MOUTH TWICE A DAY  . meclizine (ANTIVERT) 12.5 MG tablet Take 1 tablet (12.5 mg total) by mouth 3 (three) times daily as needed.  . metoprolol tartrate (LOPRESSOR) 25 MG tablet Take 1 tablet (25 mg total) by mouth 2 (two) times daily.  Marland Kitchen omeprazole (PRILOSEC) 20 MG capsule TAKE 1 CAPSULE (20 MG TOTAL) BY MOUTH DAILY.  . Saw Palmetto, Serenoa repens, (SAW PALMETTO PO) Take 2 tablets by mouth 2 (two) times daily.  . simvastatin (ZOCOR) 40 MG tablet Take 20 mg by mouth every evening.  . tamsulosin (FLOMAX) 0.4 MG CAPS capsule Take 0.4 mg by mouth daily after breakfast.  . tolterodine (DETROL LA) 4 MG 24 hr capsule Take 4 mg by mouth daily.  . [DISCONTINUED] simvastatin (ZOCOR) 20 MG tablet TAKE 1 TABLET BY MOUTH AT BEDTIME  . amoxicillin-clavulanate (AUGMENTIN) 875-125 MG per tablet Take  1 tablet by mouth 2 (two) times daily.  . chlorhexidine (PERIDEX) 0.12 % solution Use as directed 15 mLs in the mouth or throat 2 (two) times daily.  Marland Kitchen HYDROcodone-acetaminophen (NORCO/VICODIN) 5-325 MG per tablet Take 1 tablet by mouth every 6 (six) hours as needed for moderate pain.  Marland Kitchen lidocaine (XYLOCAINE) 2 % solution Use as directed 20 mLs in the mouth or throat as needed for mouth pain.

## 2013-11-07 NOTE — Patient Instructions (Addendum)
I am not convinved that your pain is comign from a sinus infection.  I think your pai nis comigin from an irritated nerve root     I also advise use of the following OTC meds to help with your other cold symptoms.   Take generic OTC benadryl 25 mg every 8 hours for the drainage,  Sudafed PE  10 to 30 mg every 8 hours for the congestion, you may substitute Afrin nasal spray for the nighttime dose of sudafed PE  If needed to prevent insomnia. flushes your sinuses twice daily with Simply Saline (do over the sink because if you do it right you will spit out globs of mucus)  I have refilled the hydrocodone/acetaminophen to use for cough and pain .   Use the lidocaine solution twice daily for mouth pain ,  Swish around in mouth for 30 seconds,  Then SPIT IT OUT.  Do not eat for 1 hour after using  The peridex solution is for mouth infection.  Use it twice daily before the lidocaine solution    If you develop fever,  Sinus pain or ear pain or starting blowing out blood streaked mucus from nose,  You should start the antibiotic I have given you .   Please take a probiotic ( Align, Florajen, Mallow or Watkins) while you are on the antibiotic to prevent a serious antibiotic associated diarrhea  Called clostirudium dificile colitis

## 2013-11-07 NOTE — Assessment & Plan Note (Addendum)
Right maxillary over front incisor. Pantograms ordered.  Wil treat with peridex and viscous lidocaine/

## 2013-11-08 DIAGNOSIS — J069 Acute upper respiratory infection, unspecified: Secondary | ICD-10-CM | POA: Insufficient documentation

## 2013-11-08 NOTE — Assessment & Plan Note (Signed)
Viral syndrome likely.  This URI is most likely viral given the failure of mild HEENT  symptoms to resolve after a z pack.  I have explained that in viral URIS, an antibiotic will not help the symptoms and will increase the risk of developing diarrhea.,  Continue oral and nasal decongestants,  Ibuprofen 400 mg and tylenol 650 mq 8 hrs for aches and pains,  And will addtessalon 100 mg every 8 hours prn cough  And seconda round of abx only if symptoms worsen to include fevers, facial pain, purulent sputum./drainage.

## 2013-11-18 ENCOUNTER — Telehealth: Payer: Self-pay | Admitting: Internal Medicine

## 2013-11-18 NOTE — Telephone Encounter (Signed)
Called pt, he had found the directions and states his symptoms are improving.

## 2013-11-18 NOTE — Telephone Encounter (Signed)
The patient has two mouth washes that came with instructions he has lost his instructions and he does not know which mouth wash he should take before the other.

## 2013-11-18 NOTE — Telephone Encounter (Signed)
Use the peridex FIRST .  Before the lidocaine.  Both can be used twice daily

## 2013-11-23 DIAGNOSIS — M4316 Spondylolisthesis, lumbar region: Secondary | ICD-10-CM

## 2013-11-23 HISTORY — DX: Spondylolisthesis, lumbar region: M43.16

## 2013-12-01 ENCOUNTER — Ambulatory Visit (INDEPENDENT_AMBULATORY_CARE_PROVIDER_SITE_OTHER): Payer: Medicare Other | Admitting: Adult Health

## 2013-12-01 ENCOUNTER — Encounter: Payer: Self-pay | Admitting: Adult Health

## 2013-12-01 VITALS — BP 120/66 | HR 73 | Temp 97.9°F | Resp 12 | Wt 196.5 lb

## 2013-12-01 DIAGNOSIS — M545 Low back pain, unspecified: Secondary | ICD-10-CM

## 2013-12-01 DIAGNOSIS — M79605 Pain in left leg: Secondary | ICD-10-CM | POA: Insufficient documentation

## 2013-12-01 NOTE — Assessment & Plan Note (Signed)
Has been following conservative treatment with anti-inflammatory medication, narcotic pain medication, stretching exercises without improvement in s/s for approximately 4-6 weeks. Affecting quality of life. Patient is a very active 79 y/o male but his pain has slowed him down considerably. Send for MRI lumbar region. Pending results, If necessary, will refer to physical therapy and/or neurosurgeon or pain clinic.

## 2013-12-01 NOTE — Progress Notes (Signed)
   Subjective:    Patient ID: David Atkins, male    DOB: 17-Dec-1928, 79 y.o.   MRN: [id number]  HPI  David Hutchingsis a 79 y.o. male who presents with pain in lumbar region of spine with radiation down left lateral leg to foot. He was seen in clinic ~ 4 weeks ago s/p fall while walking his dog. Hip xray has been done and showed no acute abnormality.  Symptoms not improved. Affecting quality of life. Patient normally very active - fishing, wood work, ATV riding Pain is worse with sitting. Has doing exercises/stretches to help relieve pressure. Hydrocodone does not help. Ibuprofen helps best. He has been taking 2 ibuprofen every 4 hours.   Review of Systems  Musculoskeletal: Positive for back pain and gait problem.  Neurological: Positive for weakness.       Weakness left extremity. Tingling down lateral aspect of leg into foot.  Psychiatric/Behavioral: Negative.        Objective:   Physical Exam  Constitutional: He is oriented to person, place, and time. He appears well-developed and well-nourished. No distress.  Cardiovascular: Normal rate and regular rhythm.   Pulmonary/Chest: Effort normal. No respiratory distress.  Musculoskeletal:  Decreased ROM with bending at waist. Point tenderness lumbar region. Positive left leg raise.  Ambulating with cane for stability.   Neurological: He is alert and oriented to person, place, and time. He displays no atrophy and no tremor. He exhibits normal muscle tone. Gait abnormal. Coordination normal.  Skin: Skin is warm and dry.  Psychiatric: He has a normal mood and affect. His behavior is normal. Judgment and thought content normal.    BP 120/66  Pulse 73  Temp(Src) 97.9 F (36.6 C) (Oral)  Resp 12  Wt 196 lb 8 oz (89.132 kg)  SpO2 97%       Assessment & Plan:

## 2013-12-01 NOTE — Progress Notes (Signed)
Pre visit review using our clinic review tool, if applicable. No additional management support is needed unless otherwise documented below in the visit note. 

## 2013-12-03 ENCOUNTER — Ambulatory Visit: Payer: Self-pay | Admitting: Adult Health

## 2013-12-03 DIAGNOSIS — M48061 Spinal stenosis, lumbar region without neurogenic claudication: Secondary | ICD-10-CM

## 2013-12-03 DIAGNOSIS — M48062 Spinal stenosis, lumbar region with neurogenic claudication: Secondary | ICD-10-CM

## 2013-12-03 HISTORY — DX: Spinal stenosis, lumbar region without neurogenic claudication: M48.061

## 2013-12-03 HISTORY — DX: Spinal stenosis, lumbar region with neurogenic claudication: M48.062

## 2013-12-05 ENCOUNTER — Telehealth: Payer: Self-pay | Admitting: Internal Medicine

## 2013-12-05 NOTE — Telephone Encounter (Signed)
I do not have them.  Raquel ordered it and has

## 2013-12-05 NOTE — Telephone Encounter (Signed)
I do not have the results.  Raquel ordered the MRI on Dec 29th and I have not seen the results .  She has gone home for the day she will call him on Monday

## 2013-12-05 NOTE — Telephone Encounter (Signed)
Please advise 

## 2013-12-05 NOTE — Telephone Encounter (Signed)
The patient is wanting the results from his MRI lumbar spine.

## 2013-12-07 ENCOUNTER — Encounter: Payer: Self-pay | Admitting: Adult Health

## 2013-12-07 ENCOUNTER — Other Ambulatory Visit: Payer: Self-pay | Admitting: Adult Health

## 2013-12-07 DIAGNOSIS — M48061 Spinal stenosis, lumbar region without neurogenic claudication: Secondary | ICD-10-CM

## 2013-12-08 NOTE — Telephone Encounter (Signed)
There is a message in David Atkins's In-basket to call him with results this morning.

## 2013-12-08 NOTE — Telephone Encounter (Signed)
Message copied by Cecelia Byars on Mon Dec 08, 2013  1:07 PM ------      Message from: Sherryl Barters      Created: Sun Dec 07, 2013  6:01 PM       MRI lumbar spine            Shows stenosis at level L4-L5 (spinal narrowing at lumbar region) as well as spodylolithesis (displacement of vertebra causing pinching or injury to nerve).             Both may be causing his pain. I am referring him to neurosurgeon.            Raquel ------

## 2013-12-08 NOTE — Telephone Encounter (Signed)
See below

## 2013-12-08 NOTE — Telephone Encounter (Signed)
Pt and daughter notified of results, verbalized understanding. Advised Amber would call with an appointment to neurosurgeon when one is made.

## 2013-12-18 ENCOUNTER — Encounter: Payer: Self-pay | Admitting: Internal Medicine

## 2013-12-30 ENCOUNTER — Encounter: Payer: Self-pay | Admitting: Adult Health

## 2014-01-04 ENCOUNTER — Encounter: Payer: Self-pay | Admitting: Internal Medicine

## 2014-01-06 ENCOUNTER — Emergency Department: Payer: Self-pay | Admitting: Emergency Medicine

## 2014-01-06 LAB — COMPREHENSIVE METABOLIC PANEL
Albumin: 3.8 g/dL (ref 3.4–5.0)
Alkaline Phosphatase: 62 U/L
Anion Gap: 7 (ref 7–16)
BUN: 16 mg/dL (ref 7–18)
Bilirubin,Total: 0.4 mg/dL (ref 0.2–1.0)
Calcium, Total: 9 mg/dL (ref 8.5–10.1)
Chloride: 109 mmol/L — ABNORMAL HIGH (ref 98–107)
Co2: 27 mmol/L (ref 21–32)
Creatinine: 0.97 mg/dL (ref 0.60–1.30)
EGFR (African American): >60
EGFR (Non-African Amer.): >60
Glucose: 88 mg/dL (ref 65–99)
Osmolality: 286 (ref 275–301)
Potassium: 3.6 mmol/L (ref 3.5–5.1)
SGOT(AST): 26 U/L (ref 15–37)
SGPT (ALT): 33 U/L (ref 12–78)
Sodium: 143 mmol/L (ref 136–145)
Total Protein: 7.1 g/dL (ref 6.4–8.2)

## 2014-01-06 LAB — CBC
HCT: 44.4 % (ref 40.0–52.0)
HGB: 15.2 g/dL (ref 13.0–18.0)
MCH: 32.3 pg (ref 26.0–34.0)
MCHC: 34.3 g/dL (ref 32.0–36.0)
MCV: 94 fL (ref 80–100)
Platelet: 174 10*3/uL (ref 150–440)
RBC: 4.7 10*6/uL (ref 4.40–5.90)
RDW: 13.3 % (ref 11.5–14.5)
WBC: 7 10*3/uL (ref 3.8–10.6)

## 2014-01-06 LAB — TROPONIN I: Troponin-I: <0.02 ng/mL

## 2014-01-08 ENCOUNTER — Ambulatory Visit: Payer: Medicare Other | Admitting: Adult Health

## 2014-01-09 ENCOUNTER — Ambulatory Visit (INDEPENDENT_AMBULATORY_CARE_PROVIDER_SITE_OTHER): Payer: Medicare Other | Admitting: Cardiovascular Disease

## 2014-01-09 ENCOUNTER — Encounter: Payer: Self-pay | Admitting: Cardiovascular Disease

## 2014-01-09 VITALS — BP 120/70 | HR 65 | Ht 70.0 in | Wt 197.2 lb

## 2014-01-09 DIAGNOSIS — E785 Hyperlipidemia, unspecified: Secondary | ICD-10-CM

## 2014-01-09 DIAGNOSIS — I10 Essential (primary) hypertension: Secondary | ICD-10-CM

## 2014-01-09 DIAGNOSIS — I2510 Atherosclerotic heart disease of native coronary artery without angina pectoris: Secondary | ICD-10-CM

## 2014-01-09 MED ORDER — NITROGLYCERIN 0.4 MG SL SUBL: 0.4000 mg | SUBLINGUAL_TABLET | SUBLINGUAL | Status: DC | PRN

## 2014-01-09 MED ORDER — HYDRALAZINE HCL 25 MG PO TABS: 25.0000 mg | ORAL_TABLET | Freq: Three times a day (TID) | ORAL | Status: DC | PRN

## 2014-01-09 NOTE — Progress Notes (Signed)
Patient ID: David Atkins, male    DOB: 04/22/1929, 80 y.o.   MRN: [id number]  HPI Comments: David Atkins is a very pleasant 80 year old gentleman with a history of coronary artery disease, bypass surgery in 2006 at [facility] in [place], mild carotid arterial disease by report, history of hyperlipidemia, small smoking history in the remote past, who presents for routine followup . He is a patient of Dr. Derrel Nip.  In followup today, he reports that around Christmas he started having problems with his lower back. He has been trying to participate in physical therapy. This past week on 01/06/2014, he reported that he had general malaise, blood pressure checked showed systolic pressure greater than 200. He went to the emergency room for further evaluation. Workup there was negative including normal basic metabolic panel, negative cardiac enzymes. CT scan showed chronic microvascular changes in the deep white matter, and he was discharged home after medical management of his blood pressure. Since then he has had some throbbing of his left arm, radiating up into his neck, left side of his face.  Blood pressure was well controlled at home, also when checked by his son.  Son is concerned about various arguments he may be having with other family members at home and a stress this may be causing him. Details unavailable.  He does continue to have periodic leg swelling, appears relatively stable  Laboratory shows total cholesterol 129, LDL 71 EKG shows normal sinus rhythm with no significant ST or T wave changes   Outpatient Encounter Prescriptions as of 01/09/2014  Medication Sig  . acetaminophen (TYLENOL) 325 MG tablet Take 650 mg by mouth 2 (two) times daily as needed.  Marland Kitchen alendronate (FOSAMAX) 70 MG tablet Take 1 tablet (70 mg total) by mouth every 7 (seven) days. Take with a full glass of water on an empty stomach.  Marland Kitchen aspirin 81 MG tablet Take 81 mg by mouth daily.  . Calcium  Carbonate-Vitamin D (CALCIUM + D PO) Take by mouth.  . doxazosin (CARDURA) 4 MG tablet Take 4 mg by mouth as needed.  Marland Kitchen lisinopril (PRINIVIL,ZESTRIL) 10 MG tablet TAKE 1 TABLET BY MOUTH TWICE A DAY  . meclizine (ANTIVERT) 12.5 MG tablet Take 1 tablet (12.5 mg total) by mouth 3 (three) times daily as needed.  . metoprolol tartrate (LOPRESSOR) 25 MG tablet Take 1 tablet (25 mg total) by mouth 2 (two) times daily.  Marland Kitchen omeprazole (PRILOSEC) 20 MG capsule TAKE 1 CAPSULE (20 MG TOTAL) BY MOUTH DAILY.  . Saw Palmetto, Serenoa repens, (SAW PALMETTO PO) Take 2 tablets by mouth 2 (two) times daily.  . simvastatin (ZOCOR) 20 MG tablet Take 20 mg by mouth daily.  . tamsulosin (FLOMAX) 0.4 MG CAPS capsule Take 0.4 mg by mouth daily after breakfast.  . tolterodine (DETROL LA) 4 MG 24 hr capsule Take 4 mg by mouth daily.    Review of Systems  Constitutional: Negative.   HENT: Negative.   Eyes: Negative.   Respiratory: Negative.   Gastrointestinal: Negative.   Endocrine: Negative.   Musculoskeletal: Positive for back pain and neck pain.  Skin: Negative.   Allergic/Immunologic: Negative.   Neurological: Negative.   Hematological: Negative.   Psychiatric/Behavioral: Negative.   All other systems reviewed and are negative.    BP 120/70  Pulse 65  Ht 5\' 10"  (1.778 m)  Wt 197 lb 4 oz (89.472 kg)  BMI 28.30 kg/m2  Physical Exam  Nursing note and vitals reviewed. Constitutional: He is oriented to  person, place, and time. He appears well-developed and well-nourished.  HENT:  Head: Normocephalic.  Nose: Nose normal.  Mouth/Throat: Oropharynx is clear and moist.  Eyes: Conjunctivae are normal. Pupils are equal, round, and reactive to light.  Neck: Normal range of motion. Neck supple. No JVD present.  Cardiovascular: Normal rate, regular rhythm, S1 normal, S2 normal, normal heart sounds and intact distal pulses.  Exam reveals no gallop and no friction rub.   No murmur heard. Trace minimal pitting  edema bilateral ankle area  Pulmonary/Chest: Effort normal and breath sounds normal. No respiratory distress. He has no wheezes. He has no rales. He exhibits no tenderness.  Abdominal: Soft. Bowel sounds are normal. He exhibits no distension. There is no tenderness.  Musculoskeletal: Normal range of motion. He exhibits no edema and no tenderness.  Lymphadenopathy:    He has no cervical adenopathy.  Neurological: He is alert and oriented to person, place, and time. Coordination normal.  Skin: Skin is warm and dry. No rash noted. No erythema.  Psychiatric: He has a normal mood and affect. His behavior is normal. Judgment and thought content normal.      Assessment and Plan

## 2014-01-09 NOTE — Assessment & Plan Note (Signed)
Currently with no symptoms of angina. No further workup at this time. Continue current medication regimen. We did discuss if he continues to have episodes of labile blood pressure, further workup could be performed such as stress testing. He does not think recent symptoms are concerning for angina.

## 2014-01-09 NOTE — Assessment & Plan Note (Signed)
Recommended that he continue on his simvastatin

## 2014-01-09 NOTE — Patient Instructions (Signed)
If your pressures run high, Ok to take NTG every 10 minutes until pressure comes down, up to 3 pills Ok to take hydralazine 25 mg as needed for high pressure >180 You can take cardura as needed   If you start to have more episodes of high pressures , Call the office  Please call us if you have new issues that need to be addressed before your next appt.  Your physician wants you to follow-up in: 6 months.  You will receive a reminder letter in the mail two months in advance. If you don't receive a letter, please call our office to schedule the follow-up appointment.

## 2014-01-09 NOTE — Assessment & Plan Note (Addendum)
Hospital records were reviewed from 01/06/2014 including lab work and imaging .Typically blood pressure has been well controlled for him, rare episode of labile, spiking pressure this past week. Now resolved without intervention. We have recommended if he has severely elevated blood pressures in the future, that he take sublingual nitroglycerin. If blood pressure elevation persists, he could take hydralazine 25 mg when necessary. He reports that he has stopped Cardura 5 months ago. He has taken this the past 2 days with improved blood pressure. Suggested he monitor blood pressure and if it runs high, could restart Cardura on a regular basis.

## 2014-01-12 ENCOUNTER — Telehealth: Payer: Self-pay | Admitting: *Deleted

## 2014-01-12 NOTE — Telephone Encounter (Signed)
Patient called and bp is spiked again to Saturday morning 190/97 patient took a nitro and and it took back. Please let Dr. Rockey Situ know. Thanks

## 2014-01-12 NOTE — Telephone Encounter (Signed)
Would try to monitor blood pressure during the day, even when feeling well That way we can get a sense of how often this is happening  Would also consider writing down when he is doing when it is elevated

## 2014-01-13 ENCOUNTER — Telehealth: Payer: Self-pay

## 2014-01-13 MED ORDER — DOXAZOSIN MESYLATE 4 MG PO TABS
4.0000 mg | ORAL_TABLET | ORAL | Status: DC | PRN
Start: 2014-01-13 — End: 2014-02-19

## 2014-01-13 NOTE — Telephone Encounter (Signed)
Spoke w/ pt.  He states that he actually started taking and documenting his readings today.   Reports that he is checking it before and after he takes his meds.   He asks for a refill on his doxazosin and reports that this is controlling his BP quite well. Advised pt to keep a log of his readings and call me if it continues to be elevated.   Pt verbalizes understanding and is agreeable to this.

## 2014-01-13 NOTE — Telephone Encounter (Signed)
Pt presented to office this afternoon stating that he was in PT and was told that his BP was high so he came to the office to have it checked. BP today 180/98. Spoke w/ Dr. Rockey Situ who advised pt to take his cardura as instructed at last ov, as pt states he has not been taking this.   Advised pt to take cardura 4 mg qhs w/ option of increasing to bid if needed. Advised pt to take hydralazine as needed during the day and to only use his nitro for emergencies. Pt is agreeable to this and reports that he is very anxious. Reports his anxiety has increased.  Spoke w/ pt about the relationship b/t anxiety and BP. He reports that he was previously on xanax but became addicted to it. Advised pt to speak w/ his PCP about antianxiety meds.  He is agreeable to this and will report his BP readings to [place] later this week.

## 2014-01-14 ENCOUNTER — Telehealth: Payer: Self-pay | Admitting: Internal Medicine

## 2014-01-14 ENCOUNTER — Telehealth: Payer: Self-pay

## 2014-01-14 DIAGNOSIS — I10 Essential (primary) hypertension: Secondary | ICD-10-CM

## 2014-01-14 NOTE — Telephone Encounter (Signed)
Pt states he had another "high BP episode" 3 a.m. 199/101. Took 1 Nitro brought it down, at 8:30 146/76.

## 2014-01-14 NOTE — Telephone Encounter (Signed)
Attempted to call pt back.  No answer, no machine.

## 2014-01-14 NOTE — Telephone Encounter (Signed)
Patient at 3.30 am today Blood pressure 199/101 Patient took Nitro sublingual for BP and dropped 148/84. At 8.30 am was 146/76 at 12 pm 131/72 today. At 3.pm was 150/77 patient take regular BP meds at 9 am  Saw nurse at DR. [place] office yesterday and was advised to start the Hydralazine TID on regular basis. Scheduled an appointment with you on 01/19/14. Patient also stated he was seen in ER for increased BP on 01/07/14

## 2014-01-14 NOTE — Telephone Encounter (Signed)
Pt states he is having high BP and would like to talk to Dr. Derrel Nip about it.  States he sees Dr. Rockey Situ frequently.  No 30 min appt available.  Appt scheduled Monday 2/16 5:15 p.m.

## 2014-01-16 NOTE — Telephone Encounter (Signed)
Spoke w/ pt.  He reports that he has been recording his BP readings and states that he has BP "spikes" usually in the afternoon and around 3 am. States that these episodes cause him quite a bit of discomfort.  Pt reports that he takes 2 cardura as needed at night and his prn hydralazine during the day. Spoke w/ pt's daughter who reports that she is currently in town and will be taking pt to his appt w/ PCP on Monday. She reports that pt is quite anxious at times and she wonders if BP spikes are due the "medication being metabolized in his liver and the types of food that he eats." Pt requested appt to see "anyone" in our office today.  Advised pt that we do not have any openings today, that I will relay message to Dr. Rockey Situ and call them if he recommends any changes at this time.  Advised daughter that I had spoken w/ pt previously about his anxiety and this is an issue that needs to be addressed. On pt's last nurse visit, we addressed his need to avoid stressors at home and the pt became tearful and anxious. Pt seems quite anxious on the phone and reports that his BP spikes have recently been associated w/ a severe headache, which he has not experienced before.  Pt and daughter would appreciate a call back today to address his sx, as daughter is worried about his sx lasting thru the weekend.  Please advise.  Thank you!

## 2014-01-16 NOTE — Telephone Encounter (Signed)
Spoke w/ pt.  Advised him that I had spoke w/ Dr. Rockey Situ and he recommends that pt have a renal u/s. Renal artery u/s sched for Fri 2/20 @ 8:00 am. Advised pt to take his meds as prescribed and to monitor his BP when he is feeling well, not just when he feels bad. Pt reports that he is checking his BP numerous times throughout the day and night and gets up at 3 am to check it.  Advised to check his BP at scheduled times during the day, no more than 2-3 times, as this is contributing to his anxiety.  He is agreeable to this and will keep appt w/ PCP on Monday.

## 2014-01-19 ENCOUNTER — Encounter: Payer: Self-pay | Admitting: Internal Medicine

## 2014-01-19 ENCOUNTER — Ambulatory Visit (INDEPENDENT_AMBULATORY_CARE_PROVIDER_SITE_OTHER): Payer: Medicare Other | Admitting: Internal Medicine

## 2014-01-19 VITALS — BP 144/68 | HR 63 | Temp 97.3°F | Resp 18 | Wt 201.5 lb

## 2014-01-19 DIAGNOSIS — M545 Low back pain, unspecified: Secondary | ICD-10-CM

## 2014-01-19 DIAGNOSIS — G4733 Obstructive sleep apnea (adult) (pediatric): Secondary | ICD-10-CM | POA: Insufficient documentation

## 2014-01-19 DIAGNOSIS — M79605 Pain in left leg: Secondary | ICD-10-CM

## 2014-01-19 DIAGNOSIS — I10 Essential (primary) hypertension: Secondary | ICD-10-CM

## 2014-01-19 DIAGNOSIS — Z9989 Dependence on other enabling machines and devices: Principal | ICD-10-CM

## 2014-01-19 MED ORDER — LISINOPRIL-HYDROCHLOROTHIAZIDE 20-25 MG PO TABS: 1.0000 | ORAL_TABLET | Freq: Every day | ORAL | Status: DC

## 2014-01-19 NOTE — Progress Notes (Signed)
Patient ID: David Atkins, male   DOB: 03/15/1929, 80 y.o.   MRN: [id number]  Patient Active Problem List   Diagnosis Date Noted  . OSA on CPAP 01/19/2014  . Lumbar pain with radiation down left leg 12/01/2013  . Right hip pain 10/16/2013  . Contact eczema 10/15/2013  . Routine general medical examination at a health care facility 01/13/2013  . GERD (gastroesophageal reflux disease) 01/06/2013  . Pill esophagitis 12/07/2012  . Abnormal CT scan, neck 12/07/2012  . Other and unspecified hyperlipidemia 07/09/2012  . Impaired glucose tolerance 07/09/2012  . Basal cell carcinoma 07/09/2012  . Vertigo, benign paroxysmal 07/09/2012  . Osteoporosis   . BPH (benign prostatic hyperplasia)   . 3-vessel coronary artery disease   . Hypertension   . Myocardial infarction     Subjective:  CC:   Chief Complaint  Patient presents with  . Acute Visit    increased blood pressure    HPI:   David Atkins is a 80 y.o. male who presents for evaluation of new onset uncontrolled hypertension.    History : During Christmas he had a flare of back pain.,  An MRI  Was done and he wasr eferred to Neurosurgeon Earle Gell who referred for PT .    Therapy helped  Significantly.   On Feb 3 Stopped to get bp checked bc he was feeling "funny in the head"  it was very high.  Rechecked it at PT and because of his presentation and BP of 235/107 he was taken to the ER  And was treated fand worked up for several hours before being relelased with a slight improvement in BP to 185/120 achieved with additional metoprolol dose .  Was seen in follow up by cardiologist on Feb 6th and Dr Rockey Situ added hydralazine and told him to resume daily doxazosin.  Has mild orthostasis with it but not much.  He Started Taking aleve daily at the advice of his heart doctor  On Feb 6.  .   Has had 2 spikes in BP since then, symptoms associated with recurrences are negative for  chest pain but mild headache and weird feeling all over.  Feels tremuluous.  Has not had any in the last  Several days   He has OSA but has not been  tested in years not since he lived in [place] .  Wears CPAP nightly and has been waking up at 3 am with a feeling of electric shocks occurring in arms and legs which are  not occurring while awake . Had been taking aleve and resumed tylenol and motrin at bedtime several days ago with good results.   Scheduled for an ultrasound of kidneys at  Dr Donivan Scull  Office.  We will work on the sleep study    Past Medical History  Diagnosis Date  . Osteoporosis 2010    by DEXA at [facility]  . BPH (benign prostatic hyperplasia)   . Hypertension   . Myocardial infarction 2006  . basal cell   . 3-vessel coronary artery disease 2006    [place]  . Lumbar stenosis 12/03/13    MRI - lumbar pain with radiation down to LE  . Spondylolisthesis of lumbar region 11/23/13    MRI Lumbar spine  . OAB (overactive bladder)     Past Surgical History  Procedure Laterality Date  . Appendectomy  1951  . Nasal septoplasty w/ turbinoplasty  1981  . Coronary artery bypass graft  2006    3 vessel, s/p AMI  The following portions of the patient's history were reviewed and updated as appropriate: Allergies, current medications, and problem list.    Review of Systems:   Patient denies headache, fevers, malaise, unintentional weight loss, skin rash, eye pain, sinus congestion and sinus pain, sore throat, dysphagia,  hemoptysis , cough, dyspnea, wheezing, chest pain, palpitations, orthopnea, edema, abdominal pain, nausea, melena, diarrhea, constipation, flank pain, dysuria, hematuria, urinary  Frequency, nocturia, numbness, tingling, seizures,  Focal weakness, Loss of consciousness,  Tremor, insomnia, depression, anxiety, and suicidal ideation.     History   Social History  . Marital Status: Married    Spouse Name: N/A    Number of Children: N/A  . Years of Education: N/A   Occupational History  . Not on file.    Social History Main Topics  . Smoking status: Former Smoker    Types: Cigarettes    Quit date: 07/10/1963  . Smokeless tobacco: Never Used  . Alcohol Use: No  . Drug Use: No  . Sexual Activity: Not on file   Other Topics Concern  . Not on file   Social History Narrative  . No narrative on file    Objective:  Filed Vitals:   01/19/14 1208  BP: 144/68  Pulse: 63  Temp: 97.3 F (36.3 C)  Resp: 18     General appearance: alert, cooperative and appears stated age Ears: normal TM's and external ear canals both ears Throat: lips, mucosa, and tongue normal; teeth and gums normal Neck: no adenopathy, no carotid bruit, supple, symmetrical, trachea midline and thyroid not enlarged, symmetric, no tenderness/mass/nodules Back: symmetric, no curvature. ROM normal. No CVA tenderness. Lungs: clear to auscultation bilaterally Heart: regular rate and rhythm, S1, S2 normal, no murmur, click, rub or gallop Abdomen: soft, non-tender; bowel sounds normal; no masses,  no organomegaly Pulses: 2+ and symmetric Skin: Skin color, texture, turgor normal. No rashes or lesions Lymph nodes: Cervical, supraclavicular, and axillary nodes normal.  Assessment and Plan:  Hypertension Recent loss of control merits workup for secondary causes including renal artery stenosis and uncontrolled OSA.  Has appt for renal ultrasound per patient next week.  Will stop use of NSAIDs for now and order sleep study if his  Ultrasound is negative  for RAS . Given his concurrent leg edema.  Will change lisinopril 10 mg bid to lisinopril/hct 20/25 in AM .  Finally. Son's input about emotional stressors will be considered at one month follow up  Lumbar pain with radiation down left leg Trial of increased gabapentin dose to 200 mg or 300 mg at bedtime given suspension of NSAIDs for elevated BP   Updated Medication List Outpatient Encounter Prescriptions as of 01/19/2014  Medication Sig  . acetaminophen (TYLENOL) 325  MG tablet Take 650 mg by mouth 2 (two) times daily as needed.  Marland Kitchen alendronate (FOSAMAX) 70 MG tablet Take 1 tablet (70 mg total) by mouth every 7 (seven) days. Take with a full glass of water on an empty stomach.  Marland Kitchen aspirin 81 MG tablet Take 162 mg by mouth daily.   Marland Kitchen doxazosin (CARDURA) 4 MG tablet Take 1 tablet (4 mg total) by mouth as needed.  . hydrALAZINE (APRESOLINE) 25 MG tablet Take 1 tablet (25 mg total) by mouth 3 (three) times daily as needed.  . meclizine (ANTIVERT) 12.5 MG tablet Take 1 tablet (12.5 mg total) by mouth 3 (three) times daily as needed.  . metoprolol tartrate (LOPRESSOR) 25 MG tablet Take 1 tablet (25 mg total) by mouth  2 (two) times daily.  . nitroGLYCERIN (NITROSTAT) 0.4 MG SL tablet Place 1 tablet (0.4 mg total) under the tongue every 5 (five) minutes as needed for chest pain.  Marland Kitchen omeprazole (PRILOSEC) 20 MG capsule TAKE 1 CAPSULE (20 MG TOTAL) BY MOUTH DAILY.  . Saw Palmetto, Serenoa repens, (SAW PALMETTO PO) Take 2 tablets by mouth 2 (two) times daily.  . simvastatin (ZOCOR) 20 MG tablet Take 20 mg by mouth daily.  . tamsulosin (FLOMAX) 0.4 MG CAPS capsule Take 0.4 mg by mouth daily after breakfast.  . tolterodine (DETROL LA) 4 MG 24 hr capsule Take 4 mg by mouth daily.  . [DISCONTINUED] lisinopril (PRINIVIL,ZESTRIL) 10 MG tablet TAKE 1 TABLET BY MOUTH TWICE A DAY  . Calcium Carbonate-Vitamin D (CALCIUM + D PO) Take by mouth.  Marland Kitchen lisinopril-hydrochlorothiazide (PRINZIDE,ZESTORETIC) 20-25 MG per tablet Take 1 tablet by mouth daily.     No orders of the defined types were placed in this encounter.    No Follow-up on file.

## 2014-01-19 NOTE — Patient Instructions (Addendum)
Stop the ibuprofen and aleve because they can elevated blood pressure  Try the gabapentin again at 200 mg along with 1 or 2 tylenol at bedtime .  The gabapentin can be increased to 300 mg at  Bedtime if needed for pain   If the blood pressure still spikes without using ibuprofen,  You can resume it if your pain is not controlled.   I am changing the  lisinopril to 20 /25 combination with hctz (a fluid pill) once daily in the morning.    Leave  The metoprolol dose as it is   If your anxiety does not improve with BP control,  We will discuss safe therapy for anxiety next month

## 2014-01-19 NOTE — Progress Notes (Signed)
Pre-visit discussion using our clinic review tool. No additional management support is needed unless otherwise documented below in the visit note.  

## 2014-01-20 NOTE — Assessment & Plan Note (Signed)
Trial of increased gabapentin dose to 200 mg or 300 mg at bedtime given suspension of NSAIDs for elevated BP

## 2014-01-20 NOTE — Assessment & Plan Note (Addendum)
Recent loss of control merits workup for secondary causes including renal artery stenosis and uncontrolled OSA.  Has appt for renal ultrasound per patient next week.  Will stop use of NSAIDs for now and order sleep study if his  Ultrasound is negative  for RAS . Given his concurrent leg edema.  Will change lisinopril 10 mg bid to lisinopril/hct 20/25 in AM .  Finally. Son's input about emotional stressors will be considered at one month follow up

## 2014-01-21 ENCOUNTER — Telehealth: Payer: Self-pay | Admitting: Internal Medicine

## 2014-01-21 NOTE — Telephone Encounter (Signed)
Relevant patient education assigned to patient using Emmi. ° °

## 2014-01-23 ENCOUNTER — Encounter (INDEPENDENT_AMBULATORY_CARE_PROVIDER_SITE_OTHER): Payer: Medicare Other

## 2014-01-23 DIAGNOSIS — I10 Essential (primary) hypertension: Secondary | ICD-10-CM

## 2014-01-27 ENCOUNTER — Other Ambulatory Visit: Payer: Self-pay | Admitting: Internal Medicine

## 2014-01-27 ENCOUNTER — Encounter: Payer: Self-pay | Admitting: Internal Medicine

## 2014-01-30 ENCOUNTER — Other Ambulatory Visit: Payer: Self-pay | Admitting: Internal Medicine

## 2014-01-30 ENCOUNTER — Telehealth: Payer: Self-pay

## 2014-01-30 MED ORDER — METOPROLOL TARTRATE 12.5 MG HALF TABLET: ORAL_TABLET | ORAL | Status: DC

## 2014-01-30 NOTE — Telephone Encounter (Signed)
Very mild abnormalities seen on renal and aorta ultrasound Nothing that would explain spiking blood pressures Flow to the kidneys is good Possible mild buildup heading to the left kidney but this should not cause blood pressure issues Otherwise aorta looks reasonable, no aneurysm

## 2014-01-30 NOTE — Telephone Encounter (Signed)
Pt would like u/s results.  

## 2014-01-30 NOTE — Telephone Encounter (Signed)
Reviewed results w/ pt.  

## 2014-02-09 ENCOUNTER — Ambulatory Visit: Payer: Medicare Other | Admitting: *Deleted

## 2014-02-09 DIAGNOSIS — I10 Essential (primary) hypertension: Secondary | ICD-10-CM

## 2014-02-09 NOTE — Progress Notes (Signed)
Pt presented to have BP machine checked. BP on my manual check was 150/70. BP on his home automatic machine was 158/78. Advised to continue monitoring BP as directed and to notify office with any elevated or low readings.

## 2014-02-12 ENCOUNTER — Other Ambulatory Visit: Payer: Self-pay | Admitting: Internal Medicine

## 2014-02-19 ENCOUNTER — Encounter: Payer: Self-pay | Admitting: Internal Medicine

## 2014-02-19 ENCOUNTER — Ambulatory Visit (INDEPENDENT_AMBULATORY_CARE_PROVIDER_SITE_OTHER): Payer: Medicare Other | Admitting: Internal Medicine

## 2014-02-19 VITALS — BP 142/78 | HR 67 | Temp 97.5°F | Resp 16 | Wt 200.2 lb

## 2014-02-19 DIAGNOSIS — M79605 Pain in left leg: Secondary | ICD-10-CM

## 2014-02-19 DIAGNOSIS — R7309 Other abnormal glucose: Secondary | ICD-10-CM

## 2014-02-19 DIAGNOSIS — M545 Low back pain, unspecified: Secondary | ICD-10-CM

## 2014-02-19 DIAGNOSIS — R9389 Abnormal findings on diagnostic imaging of other specified body structures: Secondary | ICD-10-CM

## 2014-02-19 DIAGNOSIS — E785 Hyperlipidemia, unspecified: Secondary | ICD-10-CM

## 2014-02-19 DIAGNOSIS — I10 Essential (primary) hypertension: Secondary | ICD-10-CM

## 2014-02-19 DIAGNOSIS — J309 Allergic rhinitis, unspecified: Secondary | ICD-10-CM | POA: Insufficient documentation

## 2014-02-19 DIAGNOSIS — R7302 Impaired glucose tolerance (oral): Secondary | ICD-10-CM

## 2014-02-19 MED ORDER — OLOPATADINE HCL 0.1 % OP SOLN: 1.0000 [drp] | Freq: Two times a day (BID) | OPHTHALMIC | Status: DC

## 2014-02-19 MED ORDER — AZELASTINE-FLUTICASONE 137-50 MCG/ACT NA SUSP: 2.0000 | Freq: Every day | NASAL | Status: DC

## 2014-02-19 NOTE — Progress Notes (Signed)
Pre-visit discussion using our clinic review tool. No additional management support is needed unless otherwise documented below in the visit note.  

## 2014-02-19 NOTE — Progress Notes (Signed)
Patient ID: David Atkins, male   DOB: October 21, 1929, 80 y.o.   MRN: [id number]  Patient Active Problem List   Diagnosis Date Noted  . Allergic rhinitis 02/19/2014  . OSA on CPAP 01/19/2014  . Lumbar pain with radiation down left leg 12/01/2013  . Right hip pain 10/16/2013  . Contact eczema 10/15/2013  . Routine general medical examination at a health care facility 01/13/2013  . GERD (gastroesophageal reflux disease) 01/06/2013  . Bilateral carotid artery stenosis 12/07/2012  . Other and unspecified hyperlipidemia 07/09/2012  . Impaired glucose tolerance 07/09/2012  . Basal cell carcinoma 07/09/2012  . Vertigo, benign paroxysmal 07/09/2012  . Osteoporosis   . BPH (benign prostatic hyperplasia)   . 3-vessel coronary artery disease   . Hypertension   . Myocardial infarction     Subjective:  CC:   Chief Complaint  Patient presents with  . Follow-up    HPI:   David Atkins is a 80 y.o. male who presents for Follow up on recent ER visit for uncontrolled HTN,   And chronic back  Pain. His son accompanies him today .   His home readings brought in today are elevated  Over 50% of the time but have been improving steadily.  He has brought his home bp meter with him today. It is reading 15 ptw higher by direct comparison with ours.  It is adjustable and per staff was set to inflate to systolic 628.  Staff has reduced the top setting to 200 and repeat measurement matches our readings more closely.  Medication ajudstments at last visit :  Increased gabapentin to 300 mg at bedtime which has helped his back pain.  PT has given him exercises that have cleared up his back pain.   Added hct to lisinopril but subsequent orthostasis developed.  HCTZ was discontinued,  doxazosin was last stopped and the orathostasis has resolved.  He is taking Metoprolol bid, 1 tablet ,  Has not been taking hydralazine or lisinopril for the last week.   Bps have been < 140 consistently at home for r the past  week. Used the lisinopril hct one day for fluid retention and BP elevation on March 14   He has had ongoing emotional stressors,  but has measured his bp after a heated  argument with a contractor and it was  Not elevated.  New Cc: allergic conjunctivitis.  Seasonal.  managed with claritin in the past,  Has started taking claritin about a week ago but still symptomatic  With rhinitis,  Watering itchy eyes   Past Medical History  Diagnosis Date  . Osteoporosis 2010    by DEXA at [facility]  . BPH (benign prostatic hyperplasia)   . Hypertension   . Myocardial infarction 2006  . basal cell   . 3-vessel coronary artery disease 2006    [place]  . Lumbar stenosis 12/03/13    MRI - lumbar pain with radiation down to LE  . Spondylolisthesis of lumbar region 11/23/13    MRI Lumbar spine  . OAB (overactive bladder)     Past Surgical History  Procedure Laterality Date  . Appendectomy  1951  . Nasal septoplasty w/ turbinoplasty  1981  . Coronary artery bypass graft  2006    3 vessel, s/p AMI       The following portions of the patient's history were reviewed and updated as appropriate: Allergies, current medications, and problem list.    Review of Systems:   Patient denies headache, fevers, malaise, unintentional weight  loss, skin rash, eye pain, sinus congestion and sinus pain, sore throat, dysphagia,  hemoptysis , cough, dyspnea, wheezing, chest pain, palpitations, orthopnea, edema, abdominal pain, nausea, melena, diarrhea, constipation, flank pain, dysuria, hematuria, urinary  Frequency, nocturia, numbness, tingling, seizures,  Focal weakness, Loss of consciousness,  Tremor, insomnia, depression, anxiety, and suicidal ideation.     History   Social History  . Marital Status: Married    Spouse Name: N/A    Number of Children: N/A  . Years of Education: N/A   Occupational History  . Not on file.   Social History Main Topics  . Smoking status: Former Smoker    Types: Cigarettes     Quit date: 07/10/1963  . Smokeless tobacco: Never Used  . Alcohol Use: No  . Drug Use: No  . Sexual Activity: Not on file   Other Topics Concern  . Not on file   Social History Narrative  . No narrative on file    Objective:  Filed Vitals:   02/19/14 1033  BP: 142/78  Pulse: 67  Temp: 97.5 F (36.4 C)  Resp: 16     General appearance: alert, cooperative and appears stated age Ears: normal TM's and external ear canals both ears Throat: lips, mucosa, and tongue normal; teeth and gums normal Neck: no adenopathy, no carotid bruit, supple, symmetrical, trachea midline and thyroid not enlarged, symmetric, no tenderness/mass/nodules Back: symmetric, no curvature. ROM normal. No CVA tenderness. Lungs: clear to auscultation bilaterally Heart: regular rate and rhythm, S1, S2 normal, no murmur, click, rub or gallop Abdomen: soft, non-tender; bowel sounds normal; no masses,  no organomegaly Pulses: 2+ and symmetric Skin: Skin color, texture, turgor normal. No rashes or lesions Lymph nodes: Cervical, supraclavicular, and axillary nodes normal.  Assessment and Plan:  Hypertension Now well controlled on metoprolol alone. No changes today.  DASH diet recommended and limiting tablet salt.   Allergic rhinitis Adding samples of Dymista for two weeks.  Rx for patanol eye drops given.   Lumbar pain with radiation down left leg S/p neurosurgical referral,  improved with PT and bedtime gabapentin  Osteoporosis Managed with weekly alendronate, with no intolerance noted.   Impaired glucose tolerance He has no signs of diabetes by prior A1c . I have addressed  BMI and recommended wt loss of 10% of body weight over the next 6 months using a low glycemic index diet and regular exercise a minimum of 5 days per week.    Other and unspecified hyperlipidemia With CAD and PAD.  Continue simvastatin, asa.  Lab Results  Component Value Date   CHOL 121 04/17/2013   HDL 34.80* 04/17/2013    LDLCALC 72 04/17/2013   TRIG 72.0 04/17/2013   CHOLHDL 3 04/17/2013   Lab Results  Component Value Date   ALT 26 10/15/2013   AST 27 10/15/2013   ALKPHOS 55 10/15/2013   BILITOT 0.7 10/15/2013     Bilateral carotid artery stenosis Subsequent vascular evaluation with dopplers done Feb 2014 noted < 50% stenosis bilaterally.  No intervention recommended,  contineu asa and astatin and annual follow up.   A total of 40 minutes was spent with patient more than half of which was spent in counseling, reviewing records from other prviders and coordination of care.  Updated Medication List Outpatient Encounter Prescriptions as of 02/19/2014  Medication Sig  . acetaminophen (TYLENOL) 325 MG tablet Take 650 mg by mouth 2 (two) times daily as needed.  Marland Kitchen alendronate (FOSAMAX) 70 MG tablet Take 1 tablet (  70 mg total) by mouth every 7 (seven) days. Take with a full glass of water on an empty stomach.  Marland Kitchen aspirin 81 MG tablet Take 162 mg by mouth daily.   . Calcium Carbonate-Vitamin D (CALCIUM + D PO) Take by mouth.  . metoprolol tartrate (LOPRESSOR) 12.5 mg TABS tablet TAKE 1/2  TABLET (12.5  MG TOTAL) BY MOUTH 2 (TWO) TIMES DAILY.  . nitroGLYCERIN (NITROSTAT) 0.4 MG SL tablet Place 1 tablet (0.4 mg total) under the tongue every 5 (five) minutes as needed for chest pain.  Marland Kitchen omeprazole (PRILOSEC) 20 MG capsule TAKE 1 CAPSULE (20 MG TOTAL) BY MOUTH DAILY.  Marland Kitchen omeprazole (PRILOSEC) 20 MG capsule TAKE 1 CAPSULE (20 MG TOTAL) BY MOUTH DAILY.  . Saw Palmetto, Serenoa repens, (SAW PALMETTO PO) Take 2 tablets by mouth 2 (two) times daily.  . simvastatin (ZOCOR) 20 MG tablet Take 20 mg by mouth daily.  . tamsulosin (FLOMAX) 0.4 MG CAPS capsule Take 0.4 mg by mouth daily after breakfast.  . tolterodine (DETROL LA) 4 MG 24 hr capsule Take 4 mg by mouth daily.  . Azelastine-Fluticasone (DYMISTA) 137-50 MCG/ACT SUSP Place 2 Squirts into the nose daily. In each nostril  . meclizine (ANTIVERT) 12.5 MG tablet Take 1  tablet (12.5 mg total) by mouth 3 (three) times daily as needed.  Marland Kitchen olopatadine (PATANOL) 0.1 % ophthalmic solution Place 1 drop into both eyes 2 (two) times daily.  . [DISCONTINUED] doxazosin (CARDURA) 4 MG tablet Take 1 tablet (4 mg total) by mouth as needed.  . [DISCONTINUED] hydrALAZINE (APRESOLINE) 25 MG tablet Take 1 tablet (25 mg total) by mouth 3 (three) times daily as needed.     No orders of the defined types were placed in this encounter.    No Follow-up on file.

## 2014-02-19 NOTE — Patient Instructions (Addendum)
Your blood pressure is much better!  You should continue metoprolol tiwce daily and save the lisinopril hct for prn use (BP> 150) or for fluid retnetion  Try substituting David Atkins for your salt on the table  You can add the Dymista nasal spray to your claritin  for your allergic conjunctivitis.  2 sprays in each nostril daily .  You may add the eye drops if no imporvement   DASH Diet The DASH diet stands for "Dietary Approaches to Stop Hypertension." It is a healthy eating plan that has been shown to reduce high blood pressure (hypertension) in as little as 14 days, while also possibly providing other significant health benefits. These other health benefits include reducing the risk of breast cancer after menopause and reducing the risk of type 2 diabetes, heart disease, colon cancer, and stroke. Health benefits also include weight loss and slowing kidney failure in patients with chronic kidney disease.  DIET GUIDELINES  Limit salt (sodium). Your diet should contain less than 1500 mg of sodium daily.  Limit refined or processed carbohydrates. Your diet should include mostly whole grains. Desserts and added sugars should be used sparingly.  Include small amounts of heart-healthy fats. These types of fats include nuts, oils, and tub margarine. Limit saturated and trans fats. These fats have been shown to be harmful in the body. CHOOSING FOODS  The following food groups are based on a 2000 calorie diet. See your Registered Dietitian for individual calorie needs. Grains and Grain Products (6 to 8 servings daily)  Eat More Often: Whole-wheat bread, brown rice, whole-grain or wheat pasta, quinoa, popcorn without added fat or salt (air popped).  Eat Less Often: White bread, white pasta, white rice, cornbread. Vegetables (4 to 5 servings daily)  Eat More Often: Fresh, frozen, and canned vegetables. Vegetables may be raw, steamed, roasted, or grilled with a minimal amount of fat.  Eat Less  Often/Avoid: Creamed or fried vegetables. Vegetables in a cheese sauce. Fruit (4 to 5 servings daily)  Eat More Often: All fresh, canned (in natural juice), or frozen fruits. Dried fruits without added sugar. One hundred percent fruit juice ( cup [237 mL] daily).  Eat Less Often: Dried fruits with added sugar. Canned fruit in light or heavy syrup. [employer], Fish, and Poultry (2 servings or less daily. One serving is 3 to 4 oz [85-114 g]).  Eat More Often: Ninety percent or leaner ground beef, tenderloin, sirloin. Round cuts of beef, chicken breast, [place] breast. All fish. Grill, bake, or broil your meat. Nothing should be fried.  Eat Less Often/Avoid: Fatty cuts of meat, [place], or chicken leg, thigh, or wing. Fried cuts of meat or fish. Dairy (2 to 3 servings)  Eat More Often: Low-fat or fat-free milk, low-fat plain or light yogurt, reduced-fat or part-skim cheese.  Eat Less Often/Avoid: Milk (whole, 2%).Whole milk yogurt. Full-fat cheeses. Nuts, Seeds, and Legumes (4 to 5 servings per week)  Eat More Often: All without added salt.  Eat Less Often/Avoid: Salted nuts and seeds, canned beans with added salt. Fats and Sweets (limited)  Eat More Often: Vegetable oils, tub margarines without trans fats, sugar-free gelatin. Mayonnaise and salad dressings.  Eat Less Often/Avoid: Coconut oils, palm oils, butter, stick margarine, cream, half and half, cookies, candy, pie. FOR MORE INFORMATION The Dash Diet Eating Plan: www.dashdiet.org Document Released: 11/09/2011 Document Revised: 02/12/2012 Document Reviewed: 11/09/2011 [facility] Patient Information 2014 [place], [place].

## 2014-02-22 ENCOUNTER — Encounter: Payer: Self-pay | Admitting: Internal Medicine

## 2014-02-22 NOTE — Assessment & Plan Note (Signed)
Subsequent vascular evaluation with dopplers done Feb 2014 noted < 50% stenosis bilaterally.  No intervention recommended,  contineu asa and astatin and annual follow up.

## 2014-02-22 NOTE — Assessment & Plan Note (Signed)
He has no signs of diabetes by prior A1c . I have addressed  BMI and recommended wt loss of 10% of body weight over the next 6 months using a low glycemic index diet and regular exercise a minimum of 5 days per week.

## 2014-02-22 NOTE — Assessment & Plan Note (Signed)
Managed with weekly alendronate, with no intolerance noted.

## 2014-02-22 NOTE — Assessment & Plan Note (Signed)
Adding samples of Dymista for two weeks.  Rx for patanol eye drops given.

## 2014-02-22 NOTE — Assessment & Plan Note (Addendum)
Now well controlled on metoprolol alone. No changes today.  DASH diet recommended and limiting tablet salt.

## 2014-02-22 NOTE — Assessment & Plan Note (Addendum)
S/p neurosurgical referral,  improved with PT and bedtime gabapentin

## 2014-02-22 NOTE — Assessment & Plan Note (Signed)
With CAD and PAD.  Continue simvastatin, asa.  Lab Results  Component Value Date   CHOL 121 04/17/2013   HDL 34.80* 04/17/2013   LDLCALC 72 04/17/2013   TRIG 72.0 04/17/2013   CHOLHDL 3 04/17/2013   Lab Results  Component Value Date   ALT 26 10/15/2013   AST 27 10/15/2013   ALKPHOS 55 10/15/2013   BILITOT 0.7 10/15/2013

## 2014-05-22 ENCOUNTER — Ambulatory Visit (INDEPENDENT_AMBULATORY_CARE_PROVIDER_SITE_OTHER): Payer: Medicare Other | Admitting: Internal Medicine

## 2014-05-22 ENCOUNTER — Encounter: Payer: Self-pay | Admitting: Internal Medicine

## 2014-05-22 VITALS — BP 136/70 | HR 66 | Temp 98.2°F | Resp 18 | Ht 70.0 in | Wt 199.2 lb

## 2014-05-22 DIAGNOSIS — I10 Essential (primary) hypertension: Secondary | ICD-10-CM

## 2014-05-22 DIAGNOSIS — R5383 Other fatigue: Principal | ICD-10-CM

## 2014-05-22 DIAGNOSIS — E559 Vitamin D deficiency, unspecified: Secondary | ICD-10-CM | POA: Insufficient documentation

## 2014-05-22 DIAGNOSIS — E669 Obesity, unspecified: Secondary | ICD-10-CM

## 2014-05-22 DIAGNOSIS — R5381 Other malaise: Secondary | ICD-10-CM

## 2014-05-22 LAB — CBC WITH DIFFERENTIAL/PLATELET
Basophils Absolute: 0 10*3/uL (ref 0.0–0.1)
Basophils Relative: 0.3 % (ref 0.0–3.0)
Eosinophils Absolute: 0.2 10*3/uL (ref 0.0–0.7)
Eosinophils Relative: 2.8 % (ref 0.0–5.0)
HCT: 44.2 % (ref 39.0–52.0)
Hemoglobin: 14.6 g/dL (ref 13.0–17.0)
Lymphocytes Relative: 32.2 % (ref 12.0–46.0)
Lymphs Abs: 2.4 10*3/uL (ref 0.7–4.0)
MCHC: 33 g/dL (ref 30.0–36.0)
MCV: 97.1 fl (ref 78.0–100.0)
Monocytes Absolute: 0.7 10*3/uL (ref 0.1–1.0)
Monocytes Relative: 8.7 % (ref 3.0–12.0)
Neutro Abs: 4.2 10*3/uL (ref 1.4–7.7)
Neutrophils Relative %: 56 % (ref 43.0–77.0)
Platelets: 220 10*3/uL (ref 150.0–400.0)
RBC: 4.55 Mil/uL (ref 4.22–5.81)
RDW: 13.1 % (ref 11.5–15.5)
WBC: 7.5 10*3/uL (ref 4.0–10.5)

## 2014-05-22 LAB — COMPREHENSIVE METABOLIC PANEL
ALT: 27 U/L (ref 0–53)
AST: 25 U/L (ref 0–37)
Albumin: 4 g/dL (ref 3.5–5.2)
Alkaline Phosphatase: 52 U/L (ref 39–117)
BUN: 15 mg/dL (ref 6–23)
CO2: 27 mEq/L (ref 19–32)
Calcium: 9 mg/dL (ref 8.4–10.5)
Chloride: 108 mEq/L (ref 96–112)
Creatinine, Ser: 0.9 mg/dL (ref 0.4–1.5)
GFR: 84.56 mL/min (ref >60.00)
Glucose, Bld: 82 mg/dL (ref 70–99)
Potassium: 4.1 mEq/L (ref 3.5–5.1)
Sodium: 140 mEq/L (ref 135–145)
Total Bilirubin: 0.6 mg/dL (ref 0.2–1.2)
Total Protein: 6.3 g/dL (ref 6.0–8.3)

## 2014-05-22 LAB — TSH: TSH: 1.87 u[IU]/mL (ref 0.35–4.50)

## 2014-05-22 LAB — VITAMIN D 25 HYDROXY (VIT D DEFICIENCY, FRACTURES): VITD: 21.7 ng/mL

## 2014-05-22 MED ORDER — ERGOCALCIFEROL 1.25 MG (50000 UT) PO CAPS: 50000.0000 [IU] | ORAL_CAPSULE | ORAL | Status: DC

## 2014-05-22 MED ORDER — BIOTENE ORALBALANCE DRY MOUTH MT LIQD: OROMUCOSAL | Status: DC

## 2014-05-22 NOTE — Patient Instructions (Signed)
For your tick bites,  Limit use of Neosporin to the first week  After one week use otc steroid cream (1% hydrocortisone)  Biotene  Spray mist for your dry mouth as needed ( up to 4 times daily )  vaseline for lips to keep moist.   Your blood pressures are fine.  Continue  The metoprolol  This Dr  Lupita Dawn One version of a  "Low GI"  Diet:  It will still lower your blood sugars and allow you to lose 4 to 8  lbs  per month if you follow it carefully.  Your goal with exercise is a minimum of 30 minutes of aerobic exercise 5 days per week (Walking does not count once it becomes easy!)    All of the foods can be found at grocery stores and in bulk at [employer].  The Atkins protein bars and shakes are available in more varieties at [employer], [employer] and [place].     7 AM Breakfast:  Choose from the following:  Low carbohydrate Protein  Shakes (I recommend the EAS AdvantEdge "Carb Control" shakes  Or the low carb shakes by Atkins.    2.5 carbs   Arnold's "Sandwhich Thin"toasted  w/ peanut butter (no jelly: about 20 net carbs  "Bagel Thin" with cream cheese and salmon: about 20 carbs   a scrambled egg/bacon/cheese burrito made with Mission's "carb balance" whole wheat tortilla  (about 10 net carbs )   Avoid cereal and bananas, oatmeal and cream of wheat and grits. They are loaded with carbohydrates!   10 AM: high protein snack  Protein bar by Atkins (the snack size, under 200 cal, usually < 6 net carbs).    A stick of cheese:  Around 1 carb,  100 cal     Dannon Light n Fit [place] Yogurt  (80 cal, 8 carbs)  Other so called "protein bars" and Greek yogurts tend to be loaded with carbohydrates.  Remember, in food advertising, the word "energy" is synonymous for " carbohydrate."  Lunch:   A Sandwich using the bread choices listed, Can use any  Eggs,  lunchmeat, grilled meat or canned tuna), avocado, regular mayo/mustard  and cheese.  A Salad using blue cheese, ranch,  Goddess or vinagrette,  No  croutons or "confetti" and no "candied nuts" but regular nuts OK.   No pretzels or chips.  Pickles and miniature sweet peppers are a good low carb alternative that provide a "crunch"  The bread is the only source of carbohydrate in a sandwich and  can be decreased by trying some of these alternatives to traditional loaf bread  Joseph's makes a pita bread and a flat bread that are 50 cal and 4 net carbs available at [place] and [place].  This can be toasted to use with hummous as well  Toufayan makes a low carb flatbread that's 100 cal and 9 net carbs available at [employer] and [employer]J's makes 2 sizes of  Low carb whole wheat tortilla  (The large one is 210 cal and 6 net carbs) Avoid "Low fat dressings, as well as Barry Brunner and [place] dressings They are loaded with sugar!   3 PM/ Mid day  Snack:  Consider  1 ounce of  almonds, walnuts, pistachios, pecans, peanuts,  Macadamia nuts or a nut medley.  Avoid "granola"; the dried cranberries and raisins are loaded with carbohydrates. Mixed nuts as long as there are no raisins,  cranberries or dried fruit.  Try the prosciutto/mozzarella cheese sticks by Fiorruci  In deli /backery section   High protein      6 PM  Dinner:     Meat/fowl/fish with a green salad, and either broccoli, cauliflower, green beans, spinach, brussel sprouts or  Lima beans. DO NOT BREAD THE PROTEIN!!      There is a low carb pasta by Dreamfield's that is acceptable and tastes great: only 5 digestible carbs/serving.( All grocery stores but BJs carry it )  Try Hurley Cisco Angelo's chicken piccata or chicken or eggplant parm over low carb pasta.(Lowes and BJs)   Try Marjory Lies Sanchez's "Carnitas" (pulled pork, no sauce,  0 carbs) or his beef pot roast to make a dinner burrito (at BJ's)  Pesto over low carb pasta (bj's sells a good quality pesto in the center refrigerated section of the deli   Try satueeing  Cheral Marker with mushroooms  Whole wheat pasta is still full of digestible  carbs and  Not as low in glycemic index as Dreamfield's.   Brown rice is still rice,  So skip the rice and noodles if you eat [place] or [place] (or at least limit to 1/2 cup)  9 PM snack :   Breyer's "low carb" fudgsicle or  ice cream bar (Carb Smart line), or  Weight Watcher's ice cream bar , or another "no sugar added" ice cream;  a serving of fresh berries/cherries with whipped cream   Cheese or DANNON'S LlGHT N FIT GREEK YOGURT  8 ounces of [place] unsweetened almond/cococunut milk    Avoid bananas, pineapple, grapes  and watermelon on a regular basis because they are high in sugar.  THINK OF THEM AS DESSERT  Remember that snack Substitutions should be less than 10 NET carbs per serving and meals < 20 carbs. Remember to subtract fiber grams to get the "net carbs."

## 2014-05-22 NOTE — Progress Notes (Signed)
Patient Active Problem List   Diagnosis Date Noted  . Overweight 05/24/2014  . Unspecified vitamin D deficiency 05/22/2014  . Allergic rhinitis 02/19/2014  . OSA on CPAP 01/19/2014  . Lumbar pain with radiation down left leg 12/01/2013  . Right hip pain 10/16/2013  . Contact eczema 10/15/2013  . Routine general medical examination at a health care facility 01/13/2013  . GERD (gastroesophageal reflux disease) 01/06/2013  . Bilateral carotid artery stenosis 12/07/2012  . Other and unspecified hyperlipidemia 07/09/2012  . Impaired glucose tolerance 07/09/2012  . Basal cell carcinoma 07/09/2012  . Vertigo, benign paroxysmal 07/09/2012  . Osteoporosis   . BPH (benign prostatic hyperplasia)   . 3-vessel coronary artery disease   . Hypertension   . Myocardial infarction     Subjective:  CC:   Chief Complaint  Patient presents with  . Follow-up    3 month patient has coating on tongue mouth dry.    HPI:   Aydien Majette is a 80 y.o. male who presents for Follow up on chronic conditions including hypertension, backpain and obesity.  Back pain:  Controlled with tylenol and exercises,  Living with moderate pain . Nuerosurgery evaluation with Dr Arnoldo Morale offered epidurals prn   BP:  All normal except rare occasions  Taking Only metoprolol .  Suspends it for low bp  Obesity;  Considering weight watchers to lose his abdominal obesity    Past Medical History  Diagnosis Date  . Osteoporosis 2010    by DEXA at [facility]  . BPH (benign prostatic hyperplasia)   . Hypertension   . Myocardial infarction 2006  . basal cell   . 3-vessel coronary artery disease 2006    [place]  . Lumbar stenosis 12/03/13    MRI - lumbar pain with radiation down to LE  . Spondylolisthesis of lumbar region 11/23/13    MRI Lumbar spine  . OAB (overactive bladder)     Past Surgical History  Procedure Laterality Date  . Appendectomy  1951  . Nasal septoplasty w/ turbinoplasty  1981  . Coronary artery  bypass graft  2006    3 vessel, s/p AMI       The following portions of the patient's history were reviewed and updated as appropriate: Allergies, current medications, and problem list.    Review of Systems:   Patient denies headache, fevers, malaise, unintentional weight loss, skin rash, eye pain, sinus congestion and sinus pain, sore throat, dysphagia,  hemoptysis , cough, dyspnea, wheezing, chest pain, palpitations, orthopnea, edema, abdominal pain, nausea, melena, diarrhea, constipation, flank pain, dysuria, hematuria, urinary  Frequency, nocturia, numbness, tingling, seizures,  Focal weakness, Loss of consciousness,  Tremor, insomnia, depression, anxiety, and suicidal ideation.     History   Social History  . Marital Status: Married    Spouse Name: N/A    Number of Children: N/A  . Years of Education: N/A   Occupational History  . Not on file.   Social History Main Topics  . Smoking status: Former Smoker    Types: Cigarettes    Quit date: 07/10/1963  . Smokeless tobacco: Never Used  . Alcohol Use: No  . Drug Use: No  . Sexual Activity: Not on file   Other Topics Concern  . Not on file   Social History Narrative  . No narrative on file    Objective:  Filed Vitals:   05/22/14 1326  BP: 136/70  Pulse: 66  Temp: 98.2 F (36.8 C)  Resp: 18  General appearance: alert, cooperative and appears stated age Ears: normal TM's and external ear canals both ears Throat: lips, mucosa, and tongue normal; teeth and gums normal Neck: no adenopathy, no carotid bruit, supple, symmetrical, trachea midline and thyroid not enlarged, symmetric, no tenderness/mass/nodules Back: symmetric, no curvature. ROM normal. No CVA tenderness. Lungs: clear to auscultation bilaterally Heart: regular rate and rhythm, S1, S2 normal, no murmur, click, rub or gallop Abdomen: soft, non-tender; bowel sounds normal; no masses,  no organomegaly Pulses: 2+ and symmetric Skin: Skin color,  texture, turgor normal. No rashes or lesions Lymph nodes: Cervical, supraclavicular, and axillary nodes normal.  Assessment and Plan:  Unspecified vitamin D deficiency Diagnosed today,,  Has osteoporosis  Drisol rx'd   Hypertension Well controlled on current regimen. Renal function stable, no changes today. Lab Results  Component Value Date   CREATININE 0.9 05/22/2014     Overweight I have addressed  BMI and recommended wt loss of 10% of body weigh over the next 6 months using a low glycemic index diet and regular exercise a minimum of 5 days per week.     Updated Medication List Outpatient Encounter Prescriptions as of 05/22/2014  Medication Sig  . acetaminophen (TYLENOL) 325 MG tablet Take 650 mg by mouth 2 (two) times daily as needed.  Marland Kitchen alendronate (FOSAMAX) 70 MG tablet Take 1 tablet (70 mg total) by mouth every 7 (seven) days. Take with a full glass of water on an empty stomach.  Marland Kitchen aspirin 81 MG tablet Take 162 mg by mouth daily.   . meclizine (ANTIVERT) 12.5 MG tablet Take 1 tablet (12.5 mg total) by mouth 3 (three) times daily as needed.  . metoprolol tartrate (LOPRESSOR) 12.5 mg TABS tablet TAKE 1/2  TABLET (12.5  MG TOTAL) BY MOUTH 2 (TWO) TIMES DAILY.  . nitroGLYCERIN (NITROSTAT) 0.4 MG SL tablet Place 1 tablet (0.4 mg total) under the tongue every 5 (five) minutes as needed for chest pain.  Marland Kitchen omeprazole (PRILOSEC) 20 MG capsule TAKE 1 CAPSULE (20 MG TOTAL) BY MOUTH DAILY.  . Saw Palmetto, Serenoa repens, (SAW PALMETTO PO) Take 1 tablet by mouth 2 (two) times daily.   . simvastatin (ZOCOR) 20 MG tablet Take 20 mg by mouth daily.  . tamsulosin (FLOMAX) 0.4 MG CAPS capsule Take 0.4 mg by mouth daily after breakfast.  . tolterodine (DETROL LA) 4 MG 24 hr capsule Take 4 mg by mouth daily.  . Artificial Saliva (BIOTENE ORALBALANCE DRY MOUTH) LIQD Use three tiems daily as needed for dry mouth  . Azelastine-Fluticasone (DYMISTA) 137-50 MCG/ACT SUSP Place 2 Squirts into the nose  daily. In each nostril  . Calcium Carbonate-Vitamin D (CALCIUM + D PO) Take by mouth.  . ergocalciferol (DRISDOL) 50000 UNITS capsule Take 1 capsule (50,000 Units total) by mouth once a week.  Marland Kitchen olopatadine (PATANOL) 0.1 % ophthalmic solution Place 1 drop into both eyes 2 (two) times daily.  . [DISCONTINUED] omeprazole (PRILOSEC) 20 MG capsule TAKE 1 CAPSULE (20 MG TOTAL) BY MOUTH DAILY.     Orders Placed This Encounter  Procedures  . TSH  . CBC with Differential  . Comprehensive metabolic panel  . Vit D  25 hydroxy (rtn osteoporosis monitoring)    No Follow-up on file.      Patient ID: Kree Rafter, male   DOB: 1928-12-28, 80 y.o.   MRN: [id number]

## 2014-05-22 NOTE — Progress Notes (Signed)
Pre-visit discussion using our clinic review tool. No additional management support is needed unless otherwise documented below in the visit note.  

## 2014-05-24 DIAGNOSIS — E663 Overweight: Secondary | ICD-10-CM | POA: Insufficient documentation

## 2014-05-24 NOTE — Assessment & Plan Note (Signed)
I have addressed  BMI and recommended wt loss of 10% of body weigh over the next 6 months using a low glycemic index diet and regular exercise a minimum of 5 days per week.   

## 2014-05-24 NOTE — Assessment & Plan Note (Signed)
Well controlled on current regimen. Renal function stable, no changes today. Lab Results  Component Value Date   CREATININE 0.9 05/22/2014

## 2014-05-24 NOTE — Assessment & Plan Note (Signed)
Diagnosed today,,  Has osteoporosis  Drisol rx'd

## 2014-06-22 ENCOUNTER — Other Ambulatory Visit: Payer: Self-pay | Admitting: Internal Medicine

## 2014-06-24 ENCOUNTER — Other Ambulatory Visit: Payer: Self-pay | Admitting: *Deleted

## 2014-06-24 MED ORDER — OMEPRAZOLE 20 MG PO CPDR: DELAYED_RELEASE_CAPSULE | ORAL | Status: DC

## 2014-06-24 MED ORDER — METOPROLOL TARTRATE 12.5 MG HALF TABLET: ORAL_TABLET | ORAL | Status: DC

## 2014-07-29 ENCOUNTER — Other Ambulatory Visit: Payer: Self-pay | Admitting: Internal Medicine

## 2014-08-26 ENCOUNTER — Other Ambulatory Visit: Payer: Self-pay | Admitting: Internal Medicine

## 2014-09-03 ENCOUNTER — Other Ambulatory Visit: Payer: Self-pay | Admitting: Internal Medicine

## 2014-09-15 ENCOUNTER — Other Ambulatory Visit: Payer: Self-pay | Admitting: Internal Medicine

## 2014-09-15 ENCOUNTER — Telehealth: Payer: Self-pay | Admitting: Internal Medicine

## 2014-09-15 NOTE — Telephone Encounter (Signed)
Patient in office today with wife to see referrals Bonnita Nasuti)   , Patient ask if referral can be made to local dermatologist he is currently seeing Dermatology at [facility]. Patient stated is becoming to hard to travel that far and would like a local dermatologist Please advise.

## 2014-09-16 ENCOUNTER — Other Ambulatory Visit: Payer: Self-pay | Admitting: Internal Medicine

## 2014-09-16 DIAGNOSIS — Z85828 Personal history of other malignant neoplasm of skin: Secondary | ICD-10-CM

## 2014-09-20 ENCOUNTER — Other Ambulatory Visit: Payer: Self-pay | Admitting: Internal Medicine

## 2014-10-10 ENCOUNTER — Telehealth: Payer: Self-pay

## 2014-10-10 DIAGNOSIS — N401 Enlarged prostate with lower urinary tract symptoms: Principal | ICD-10-CM

## 2014-10-10 DIAGNOSIS — N138 Other obstructive and reflux uropathy: Secondary | ICD-10-CM

## 2014-10-10 NOTE — Telephone Encounter (Signed)
Spoke to pt and was able to schedule AWV for December.   Pt mentioned the Urologist he was seeing has moved to [facility] and that is too far for the pt to drive to.   Pt would like to know if he could have referral to a closer Urologist and have rx for tamsulosin (FLOMAX) 0.4 MG CAPS capsule in the mean time.

## 2014-10-12 MED ORDER — TAMSULOSIN HCL 0.4 MG PO CAPS: 0.4000 mg | ORAL_CAPSULE | Freq: Every day | ORAL | Status: DC

## 2014-10-12 NOTE — Telephone Encounter (Signed)
Ok to refill med,  Send back for the referral

## 2014-10-12 NOTE — Telephone Encounter (Signed)
Pt notified about referral and Rx. Notified [facility] will call once it has been set up. Pt states no urgent issues needed for urologist, just wanted to establish with someone closer.

## 2014-10-19 ENCOUNTER — Other Ambulatory Visit: Payer: Self-pay | Admitting: Internal Medicine

## 2014-10-20 ENCOUNTER — Other Ambulatory Visit: Payer: Self-pay | Admitting: Internal Medicine

## 2014-10-22 ENCOUNTER — Other Ambulatory Visit: Payer: Self-pay | Admitting: Internal Medicine

## 2014-11-13 ENCOUNTER — Other Ambulatory Visit: Payer: Self-pay | Admitting: Internal Medicine

## 2014-11-25 ENCOUNTER — Encounter: Payer: Self-pay | Admitting: Internal Medicine

## 2014-11-25 ENCOUNTER — Ambulatory Visit (INDEPENDENT_AMBULATORY_CARE_PROVIDER_SITE_OTHER): Payer: Medicare Other | Admitting: Internal Medicine

## 2014-11-25 VITALS — BP 142/78 | HR 66 | Temp 97.9°F | Resp 14 | Ht 70.0 in | Wt 201.2 lb

## 2014-11-25 DIAGNOSIS — I6523 Occlusion and stenosis of bilateral carotid arteries: Secondary | ICD-10-CM

## 2014-11-25 DIAGNOSIS — Z23 Encounter for immunization: Secondary | ICD-10-CM

## 2014-11-25 DIAGNOSIS — M545 Low back pain: Secondary | ICD-10-CM

## 2014-11-25 DIAGNOSIS — Z0000 Encounter for general adult medical examination without abnormal findings: Secondary | ICD-10-CM

## 2014-11-25 DIAGNOSIS — R143 Flatulence: Secondary | ICD-10-CM

## 2014-11-25 DIAGNOSIS — R141 Gas pain: Secondary | ICD-10-CM

## 2014-11-25 DIAGNOSIS — M79605 Pain in left leg: Secondary | ICD-10-CM

## 2014-11-25 DIAGNOSIS — R5383 Other fatigue: Secondary | ICD-10-CM

## 2014-11-25 DIAGNOSIS — E785 Hyperlipidemia, unspecified: Secondary | ICD-10-CM

## 2014-11-25 DIAGNOSIS — M810 Age-related osteoporosis without current pathological fracture: Secondary | ICD-10-CM

## 2014-11-25 DIAGNOSIS — H8113 Benign paroxysmal vertigo, bilateral: Secondary | ICD-10-CM

## 2014-11-25 DIAGNOSIS — R142 Eructation: Secondary | ICD-10-CM

## 2014-11-25 DIAGNOSIS — I10 Essential (primary) hypertension: Secondary | ICD-10-CM

## 2014-11-25 MED ORDER — MECLIZINE HCL 12.5 MG PO TABS: 12.5000 mg | ORAL_TABLET | Freq: Three times a day (TID) | ORAL | Status: DC | PRN

## 2014-11-25 MED ORDER — LACTULOSE 20 GM/30ML PO SOLN: ORAL | Status: DC

## 2014-11-25 MED ORDER — BLOOD PRESSURE MONITOR AUTOMAT DEVI: Status: DC

## 2014-11-25 NOTE — Progress Notes (Signed)
Patient ID: David Atkins, male   DOB: 1929-05-19, 80 y.o.   MRN: [id number]  The patient is here for annual Medicare wellness examination and follow up /management of  chronic and acute problems.  Several  Chronic issues:  Saw Derm for precancerous lesions on arms and faces. Squamous cell ca on left chin  Saw new urologist  in [place] last week,  Had DRE exam .  No medication changes  History of back pain:  He has had prior evaluation by Dr. Arnoldo Morale, for DJD secondary to 3 bulging disks.  His pain improving with PT prescribed at [facility] which really helped for quite a while but due to lapse in continued slef directed therapy he had another flare of pain.  He has resumed his exercise program and his pain is now  improving   Acute issues:   1) He reports a 3 week history of recurrent abdominal discomfort secondary to excessive gas and flatulence,  Eats sugar substitutes and cashews,  But nothing new.  He has tried using Beano pre meal without much improvement in postprandial discomfort.  The pain  Is noted to improve after stooling,  But he continues to experience mild LUQ discomfort after supper.   Bowels are moving regularly   Breathes through mouth a lot,  May be ingesting a lot of air,  Chronic Sinus problems prevent  Nose breathing while eating.   2) He has experienced an annual recurrence of vertigo when turning over in bed.  He has treated prior episodes  With meclizine, which he has run out of    The risk factors are reflected in the social history:  History   Social History  . Marital Status: Married    Spouse Name: N/A    Number of Children: N/A  . Years of Education: N/A   Occupational History  . Not on file.   Social History Main Topics  . Smoking status: Former Smoker    Types: Cigarettes    Quit date: 07/10/1963  . Smokeless tobacco: Never Used  . Alcohol Use: No  . Drug Use: No  . Sexual Activity: Not on file   Other Topics Concern  . Not on file   Social History  Narrative    The roster of all physicians providing medical care to patient - is listed in the Snapshot section of the chart.  Activities of daily living:  The patient is 100% independent in all ADLs: dressing, toileting, feeding as well as independent mobility  Home safety : The patient has smoke detectors in the home. They wear seatbelts.  There are no firearms at home. There is no violence in the home.   There is no risks for hepatitis, STDs or HIV. There is no   history of blood transfusion. They have no travel history to infectious disease endemic areas of the world.  The patient has seen their dentist in the last six month. They have seen their eye doctor in the last year. They admit to slight hearing difficulty with regard to whispered voices and some television programs.  They have deferred audiologic testing in the last year.  They do not  have excessive sun exposure. Discussed the need for sun protection: hats, long sleeves and use of sunscreen if there is significant sun exposure.   Diet: the importance of a healthy diet is discussed. They do have a healthy diet.  The benefits of regular aerobic exercise were discussed. he walks 4 times per week ,  20  minutes.   Depression screen: there are no signs or vegative symptoms of depression- irritability, change in appetite, anhedonia, sadness/tearfullness.  Cognitive assessment: the patient manages all their financial and personal affairs and is actively engaged. They could relate day,date,year and events; recalled 2/3 objects at 3 minutes; performed clock-face test normally.  The following portions of the patient's history were reviewed and updated as appropriate: allergies, current medications, past family history, past medical history,  past surgical history, past social history  and problem list.  Visual acuity was not assessed per patient preference since she has regular follow up with her ophthalmologist. Hearing and body mass index  were assessed and reviewed.   During the course of the visit the patient was educated and counseled about appropriate screening and preventive services including : fall prevention , diabetes screening, nutrition counseling, colorectal cancer screening, and recommended immunizations.    Objective:  BP 142/78 mmHg  Pulse 66  Temp(Src) 97.9 F (36.6 C) (Oral)  Resp 14  Ht 5\' 10"  (1.778 m)  Wt 201 lb 4 oz (91.286 kg)  BMI 28.88 kg/m2  SpO2 96%  General Appearance:    Alert, cooperative, no distress, appears stated age  Head:    Normocephalic, without obvious abnormality, atraumatic  Eyes:    PERRL, conjunctiva/corneas clear, EOM's intact, fundi    benign, both eyes       Ears:    Normal TM's and external ear canals, both ears  Nose:   Nares normal, septum midline, mucosa normal, no drainage   or sinus tenderness  Throat:   Lips, mucosa, and tongue normal; teeth and gums normal  Neck:   Supple, symmetrical, trachea midline, no adenopathy;       thyroid:  No enlargement/tenderness/nodules; no carotid   bruit or JVD  Back:     Symmetric, no curvature, ROM normal, no CVA tenderness  Lungs:     Clear to auscultation bilaterally, respirations unlabored  Chest wall:    No tenderness or deformity  Heart:    Regular rate and rhythm, S1 and S2 normal, no murmur, rub   or gallop  Abdomen:     Soft, non-tender, bowel sounds active all four quadrants,    no masses, no organomegaly  Extremities:   Extremities normal, atraumatic, no cyanosis or edema  Pulses:   2+ and symmetric all extremities  Skin:   Rosacea like skin changes,  Ruddy skin, no rashes or lesions  Lymph nodes:   Cervical, supraclavicular, and axillary nodes normal  Neurologic:   CNII-XII intact. Normal strength, sensation and reflexes      throughout    Assessment and Plan:  Problem List Items Addressed This Visit    Bilateral carotid artery stenosis    Annual surveillance imaging by [facility] with no progression noted by prior  evaluation .  Continue statin and ASA     Flatulence, eructation and gas pain    Recommended trial of Gas X and Mylanta Gas pre/psot meals and lactulose to resolve any constipation. Reviewed diet and medications.     Hyperlipidemia LDL goal <70    With CAD and PAD.  LDL and triglycerides are at goal on current medications. He has no side effects and liver enzymes are normal. No changes today  Lab Results  Component Value Date   CHOL 119 11/25/2014   HDL 33.00* 11/25/2014   LDLCALC 63 11/25/2014   TRIG 114.0 11/25/2014   CHOLHDL 4 11/25/2014   Lab Results  Component Value Date  ALT 23 11/25/2014   AST 29 11/25/2014   ALKPHOS 54 11/25/2014   BILITOT 0.3 11/25/2014         Hypertension   Relevant Orders      Comprehensive metabolic panel (Completed)   Lumbar pain with radiation down left leg    Secondary to spondylolisthesis with bilateral foraminal stenosis noted at L4-L5 level on lumbar MRI January 2015. Current exam is normal.  Continue exercises to strengthen  core and gluteal muscles.     Osteoporosis    Has been taking alendronate for at least 2 year s  Needs DEXA scan     Routine general medical examination at a health care facility    Annual wellness  exam was done as well as a comprehensive physical exam and management of acute and chronic conditions .  During the course of the visit the patient was educated and counseled about appropriate screening and preventive services including :  diabetes screening, lipid analysis with projected  10 year  risk for CAD , nutrition counseling, colorectal cancer screening, and recommended immunizations.  Printed recommendations for health maintenance screenings was given. '    Vertigo, benign paroxysmal    Annual recurrence noted.  Meclizine refilled.      Other Visit Diagnoses    Hyperlipidemia LDL goal <100    -  Primary    Relevant Orders       Lipid panel (Completed)    Other fatigue        Relevant Orders       CBC with  Differential (Completed)       TSH (Completed)    Need for vaccination with 13-polyvalent pneumococcal conjugate vaccine        Relevant Orders       Pneumococcal conjugate vaccine 13-valent (Completed)

## 2014-11-25 NOTE — Patient Instructions (Addendum)
Try taking the Gas X before you eat,  And you can  try taking Mylanta Gas after the meal if gas still develops   I am calling in lactulose to take one or 2 doses to relieve any constipation that may be contributing.  You received the pneumonia vaccine today   Flatulence There are good germs in your gut to help you digest food. Gas is produced by these germs and released from your bottom. Most people release 3 to 4 quarts of gas every day. This is normal. HOME CARE  Eat or drink less of the foods or liquids that give you gas.  Take the time to chew your food well. Talk less while you eat.  Do not suck on ice or hard candy.  Sip slowly. Stir some of the bubbles out of fizzy drinks with a spoon or straw.  Avoid chewing gum or smoking.  Ask your doctor about liquids and tablets that may help control burping and gas.  Only take medicine as told by your doctor. GET HELP RIGHT AWAY IF:   There is discomfort when you burp or pass gas.  You throw up (vomit) when you burp.  Poop (stool) comes out when you pass gas.  Your belly is puffy (swollen) and hard. MAKE SURE YOU:   Understand these instructions.  Will watch your condition.  Will get help right away if you are not doing well or get worse. Document Released: 09/22/2008 Document Revised: 02/12/2012 Document Reviewed: 09/22/2008 [facility] Patient Information 2015 [place], [place]. This information is not intended to replace advice given to you by your health care provider. Make sure you discuss any questions you have with your health care provider.   Health Maintenance A healthy lifestyle and preventative care can promote health and wellness.  Maintain regular health, dental, and eye exams.  Eat a healthy diet. Foods like vegetables, fruits, whole grains, low-fat dairy products, and lean protein foods contain the nutrients you need and are low in calories. Decrease your intake of foods high in solid fats, added sugars, and  salt. Get information about a proper diet from your health care provider, if necessary.  Regular physical exercise is one of the most important things you can do for your health. Most adults should get at least 150 minutes of moderate-intensity exercise (any activity that increases your heart rate and causes you to sweat) each week. In addition, most adults need muscle-strengthening exercises on 2 or more days a week.   Maintain a healthy weight. The body mass index (BMI) is a screening tool to identify possible weight problems. It provides an estimate of body fat based on height and weight. Your health care provider can find your BMI and can help you achieve or maintain a healthy weight. For males 20 years and older:  A BMI below 18.5 is considered underweight.  A BMI of 18.5 to 24.9 is normal.  A BMI of 25 to 29.9 is considered overweight.  A BMI of 30 and above is considered obese.  Maintain normal blood lipids and cholesterol by exercising and minimizing your intake of saturated fat. Eat a balanced diet with plenty of fruits and vegetables. Blood tests for lipids and cholesterol should begin at age 29 and be repeated every 5 years. If your lipid or cholesterol levels are high, you are over age 46, or you are at high risk for heart disease, you may need your cholesterol levels checked more frequently.Ongoing high lipid and cholesterol levels should be treated  with medicines if diet and exercise are not working.  If you smoke, find out from your health care provider how to quit. If you do not use tobacco, do not start.  Lung cancer screening is recommended for adults aged 33-80 years who are at high risk for developing lung cancer because of a history of smoking. A yearly low-dose CT scan of the lungs is recommended for people who have at least a 30-pack-year history of smoking and are current smokers or have quit within the past 15 years. A pack year of smoking is smoking an average of 1 pack  of cigarettes a day for 1 year (for example, a 30-pack-year history of smoking could mean smoking 1 pack a day for 30 years or 2 packs a day for 15 years). Yearly screening should continue until the smoker has stopped smoking for at least 15 years. Yearly screening should be stopped for people who develop a health problem that would prevent them from having lung cancer treatment.  If you choose to drink alcohol, do not have more than 2 drinks per day. One drink is considered to be 12 oz (360 mL) of beer, 5 oz (150 mL) of wine, or 1.5 oz (45 mL) of liquor.  Avoid the use of street drugs. Do not share needles with anyone. Ask for help if you need support or instructions about stopping the use of drugs.  High blood pressure causes heart disease and increases the risk of stroke. Blood pressure should be checked at least every 1-2 years. Ongoing high blood pressure should be treated with medicines if weight loss and exercise are not effective.  If you are 63-79 years old, ask your health care provider if you should take aspirin to prevent heart disease.  Diabetes screening involves taking a blood sample to check your fasting blood sugar level. This should be done once every 3 years after age 10 if you are at a normal weight and without risk factors for diabetes. Testing should be considered at a younger age or be carried out more frequently if you are overweight and have at least 1 risk factor for diabetes.  Colorectal cancer can be detected and often prevented. Most routine colorectal cancer screening begins at the age of 49 and continues through age 67. However, your health care provider may recommend screening at an earlier age if you have risk factors for colon cancer. On a yearly basis, your health care provider may provide home test kits to check for hidden blood in the stool. A small camera at the end of a tube may be used to directly examine the colon (sigmoidoscopy or colonoscopy) to detect the  earliest forms of colorectal cancer. Talk to your health care provider about this at age 68 when routine screening begins. A direct exam of the colon should be repeated every 5-10 years through age 33, unless early forms of precancerous polyps or small growths are found.  People who are at an increased risk for hepatitis B should be screened for this virus. You are considered at high risk for hepatitis B if:  You were born in a country where hepatitis B occurs often. Talk with your health care provider about which countries are considered high risk.  Your parents were born in a high-risk country and you have not received a shot to protect against hepatitis B (hepatitis B vaccine).  You have HIV or AIDS.  You use needles to inject street drugs.  You live with, or have  sex with, someone who has hepatitis B.  You are a man who has sex with other men (MSM).  You get hemodialysis treatment.  You take certain medicines for conditions like cancer, organ transplantation, and autoimmune conditions.  Hepatitis C blood testing is recommended for all people born from [birth year] through 1963 and any individual with known risk factors for hepatitis C.  Healthy men should no longer receive prostate-specific antigen (PSA) blood tests as part of routine cancer screening. Talk to your health care provider about prostate cancer screening.  Testicular cancer screening is not recommended for adolescents or adult males who have no symptoms. Screening includes self-exam, a health care provider exam, and other screening tests. Consult with your health care provider about any symptoms you have or any concerns you have about testicular cancer.  Practice safe sex. Use condoms and avoid high-risk sexual practices to reduce the spread of sexually transmitted infections (STIs).  You should be screened for STIs, including gonorrhea and chlamydia if:  You are sexually active and are younger than 24 years.  You are older  than 24 years, and your health care provider tells you that you are at risk for this type of infection.  Your sexual activity has changed since you were last screened, and you are at an increased risk for chlamydia or gonorrhea. Ask your health care provider if you are at risk.  If you are at risk of being infected with HIV, it is recommended that you take a prescription medicine daily to prevent HIV infection. This is called pre-exposure prophylaxis (PrEP). You are considered at risk if:  You are a man who has sex with other men (MSM).  You are a heterosexual man who is sexually active with multiple partners.  You take drugs by injection.  You are sexually active with a partner who has HIV.  Talk with your health care provider about whether you are at high risk of being infected with HIV. If you choose to begin PrEP, you should first be tested for HIV. You should then be tested every 3 months for as long as you are taking PrEP.  Use sunscreen. Apply sunscreen liberally and repeatedly throughout the day. You should seek shade when your shadow is shorter than you. Protect yourself by wearing long sleeves, pants, a wide-brimmed hat, and sunglasses year round whenever you are outdoors.  Tell your health care provider of new moles or changes in moles, especially if there is a change in shape or color. Also, tell your health care provider if a mole is larger than the size of a pencil eraser.  A one-time screening for abdominal aortic aneurysm (AAA) and surgical repair of large AAAs by ultrasound is recommended for men aged 31-75 years who are current or former smokers.  Stay current with your vaccines (immunizations). Document Released: 05/18/2008 Document Revised: 11/25/2013 Document Reviewed: 04/17/2011 [facility] Patient Information 2015 [place], [place]. This information is not intended to replace advice given to you by your health care provider. Make sure you discuss any questions you have with  your health care provider.

## 2014-11-25 NOTE — Progress Notes (Signed)
Pre visit review using our clinic review tool, if applicable. No additional management support is needed unless otherwise documented below in the visit note. 

## 2014-11-25 NOTE — Assessment & Plan Note (Signed)
Has been taking alendronate for at least 2 year s  Needs DEXA scan

## 2014-11-26 LAB — CBC WITH DIFFERENTIAL/PLATELET
Basophils Absolute: 0 10*3/uL (ref 0.0–0.1)
Basophils Relative: 0.2 % (ref 0.0–3.0)
Eosinophils Absolute: 0.3 10*3/uL (ref 0.0–0.7)
Eosinophils Relative: 3.5 % (ref 0.0–5.0)
HCT: 43.7 % (ref 39.0–52.0)
Hemoglobin: 14.6 g/dL (ref 13.0–17.0)
Lymphocytes Relative: 31.1 % (ref 12.0–46.0)
Lymphs Abs: 2.3 10*3/uL (ref 0.7–4.0)
MCHC: 33.3 g/dL (ref 30.0–36.0)
MCV: 96.5 fl (ref 78.0–100.0)
Monocytes Absolute: 0.6 10*3/uL (ref 0.1–1.0)
Monocytes Relative: 7.5 % (ref 3.0–12.0)
Neutro Abs: 4.3 10*3/uL (ref 1.4–7.7)
Neutrophils Relative %: 57.7 % (ref 43.0–77.0)
Platelets: 233 10*3/uL (ref 150.0–400.0)
RBC: 4.53 Mil/uL (ref 4.22–5.81)
RDW: 13.3 % (ref 11.5–15.5)
WBC: 7.4 10*3/uL (ref 4.0–10.5)

## 2014-11-26 LAB — COMPREHENSIVE METABOLIC PANEL
ALT: 23 U/L (ref 0–53)
AST: 29 U/L (ref 0–37)
Albumin: 3.9 g/dL (ref 3.5–5.2)
Alkaline Phosphatase: 54 U/L (ref 39–117)
BUN: 15 mg/dL (ref 6–23)
CO2: 22 mEq/L (ref 19–32)
Calcium: 9 mg/dL (ref 8.4–10.5)
Chloride: 109 mEq/L (ref 96–112)
Creatinine, Ser: 1 mg/dL (ref 0.4–1.5)
GFR: 75.74 mL/min (ref >60.00)
Glucose, Bld: 97 mg/dL (ref 70–99)
Potassium: 4.6 mEq/L (ref 3.5–5.1)
Sodium: 140 mEq/L (ref 135–145)
Total Bilirubin: 0.3 mg/dL (ref 0.2–1.2)
Total Protein: 7 g/dL (ref 6.0–8.3)

## 2014-11-26 LAB — LIPID PANEL
Cholesterol: 119 mg/dL (ref 0–200)
HDL: 33 mg/dL — ABNORMAL LOW (ref >39.00)
LDL Cholesterol: 63 mg/dL (ref 0–99)
NonHDL: 86
Total CHOL/HDL Ratio: 4
Triglycerides: 114 mg/dL (ref 0.0–149.0)
VLDL: 22.8 mg/dL (ref 0.0–40.0)

## 2014-11-26 LAB — TSH: TSH: 1.27 u[IU]/mL (ref 0.35–4.50)

## 2014-11-28 ENCOUNTER — Encounter: Payer: Self-pay | Admitting: Internal Medicine

## 2014-11-28 DIAGNOSIS — R143 Flatulence: Secondary | ICD-10-CM

## 2014-11-28 DIAGNOSIS — R141 Gas pain: Secondary | ICD-10-CM | POA: Insufficient documentation

## 2014-11-28 DIAGNOSIS — R142 Eructation: Secondary | ICD-10-CM

## 2014-11-28 NOTE — Assessment & Plan Note (Signed)

## 2014-11-28 NOTE — Assessment & Plan Note (Signed)
Annual recurrence noted.  Meclizine refilled.

## 2014-11-28 NOTE — Assessment & Plan Note (Signed)
Secondary to spondylolisthesis with bilateral foraminal stenosis noted at L4-L5 level on lumbar MRI January 2015. Current exam is normal.  Continue exercises to strengthen  core and gluteal muscles.

## 2014-11-28 NOTE — Assessment & Plan Note (Signed)
With CAD and PAD.  LDL and triglycerides are at goal on current medications. He has no side effects and liver enzymes are normal. No changes today  Lab Results  Component Value Date   CHOL 119 11/25/2014   HDL 33.00* 11/25/2014   LDLCALC 63 11/25/2014   TRIG 114.0 11/25/2014   CHOLHDL 4 11/25/2014   Lab Results  Component Value Date   ALT 23 11/25/2014   AST 29 11/25/2014   ALKPHOS 54 11/25/2014   BILITOT 0.3 11/25/2014

## 2014-11-28 NOTE — Assessment & Plan Note (Signed)
Annual surveillance imaging by [facility] with no progression noted by prior evaluation .  Continue statin and ASA

## 2014-11-28 NOTE — Assessment & Plan Note (Signed)
Recommended trial of Gas X and Mylanta Gas pre/psot meals and lactulose to resolve any constipation. Reviewed diet and medications.

## 2014-11-30 ENCOUNTER — Encounter: Payer: Self-pay | Admitting: Internal Medicine

## 2014-11-30 ENCOUNTER — Other Ambulatory Visit: Payer: Self-pay | Admitting: Internal Medicine

## 2014-12-17 DIAGNOSIS — D0439 Carcinoma in situ of skin of other parts of face: Secondary | ICD-10-CM | POA: Diagnosis not present

## 2015-01-19 ENCOUNTER — Encounter: Payer: Self-pay | Admitting: Internal Medicine

## 2015-01-19 ENCOUNTER — Ambulatory Visit (INDEPENDENT_AMBULATORY_CARE_PROVIDER_SITE_OTHER): Payer: Medicare Other | Admitting: Internal Medicine

## 2015-01-19 VITALS — BP 148/72 | HR 67 | Temp 98.0°F | Resp 16 | Ht 70.0 in | Wt 204.2 lb

## 2015-01-19 DIAGNOSIS — I2510 Atherosclerotic heart disease of native coronary artery without angina pectoris: Secondary | ICD-10-CM

## 2015-01-19 DIAGNOSIS — I10 Essential (primary) hypertension: Secondary | ICD-10-CM

## 2015-01-19 DIAGNOSIS — F411 Generalized anxiety disorder: Secondary | ICD-10-CM

## 2015-01-19 MED ORDER — LISINOPRIL 10 MG PO TABS: 20.0000 mg | ORAL_TABLET | Freq: Every day | ORAL | Status: DC

## 2015-01-19 MED ORDER — DIAZEPAM 5 MG PO TABS
5.0000 mg | ORAL_TABLET | Freq: Every day | ORAL | Status: DC | PRN
Start: 2015-01-19 — End: 2015-11-18

## 2015-01-19 NOTE — Patient Instructions (Addendum)
DO NOT TAKE MORE METOPROLOL THAt YOU ARE PRESCRIBED.  TOO ,MUCH CAN SLOW YOUR HEART RATE DOWN TOO MUCH AND YOU COULD PASS OUT.  You can the the diazepam not more than once a day as needed for anxiety.    I am increasing your lisinopril to 20 mg daily .  Take both tablets in the morning   DO NOT ADJUST YOUR MEDICATIONS ANY MORE AFTER THAT    If your BP is still > 160 one hour after you have taken a diazepam for stress,  You should record the reading and bring  me a log in a week

## 2015-01-19 NOTE — Progress Notes (Signed)
Patient ID: David Atkins, male   DOB: 01-12-1929, 81 y.o.   MRN: [id number]  Patient Active Problem List   Diagnosis Date Noted  . Generalized anxiety disorder 01/20/2015  . Flatulence, eructation and gas pain 11/28/2014  . Overweight 05/24/2014  . Unspecified vitamin D deficiency 05/22/2014  . Allergic rhinitis 02/19/2014  . OSA on CPAP 01/19/2014  . Lumbar pain with radiation down left leg 12/01/2013  . Right hip pain 10/16/2013  . Contact eczema 10/15/2013  . Routine general medical examination at a health care facility 01/13/2013  . GERD (gastroesophageal reflux disease) 01/06/2013  . Bilateral carotid artery stenosis 12/07/2012  . Hyperlipidemia LDL goal <70 07/09/2012  . Impaired glucose tolerance 07/09/2012  . Basal cell carcinoma 07/09/2012  . Vertigo, benign paroxysmal 07/09/2012  . Osteoporosis   . BPH (benign prostatic hyperplasia)   . 3-vessel coronary artery disease   . Hypertension   . Myocardial infarction     Subjective:  CC:   Chief Complaint  Patient presents with  . Hypertension    Patient increased BP    HPI:   David Atkins is a 81 y.o. male who presents for  Elevated blood pressure readings , He was last seen two months ago for wellness exam   He brought his new home BP monitor to the office for comparison in real time.  It is reading almost 10 pts higher than ours :  148/72 ours, home was 155/80  Starting on Feb 4, he noticed  BP starting going up ,  So he increased his lisinopril and metoprolol doses with no significant change.  Recalls that he had been experiencing the stress of some discussion with hospital and his health  insurance company has a history of aggravating his BLOOD PRESSURE   Since thoses issues have resolved,  His BP has improved, suggesting anxiety as a trigger for the elevated readings.  He used to take alprazolam  On a daily basis years ago and does  Not want to become addicted .   Past Medical History  Diagnosis Date  .  Osteoporosis 2010    by D[facility] at [facility]  . BPH (benign prostatic hyperplasia)   . Hypertension   . Myocardial infarction 2006  . basal cell   . 3-vessel coronary artery disease 2006    [place]  . Lumbar stenosis 12/03/13    MRI - lumbar pain with radiation down to LE  . Spondylolisthesis of lumbar region 11/23/13    MRI Lumbar spine  . OAB (overactive bladder)     Past Surgical History  Procedure Laterality Date  . Appendectomy  1951  . Nasal septoplasty w/ turbinoplasty  1981  . Coronary artery bypass graft  2006    3 vessel, s/p AMI       The following portions of the patient's history were reviewed and updated as appropriate: Allergies, current medications, and problem list.    Review of Systems:   Patient denies headache, fevers, malaise, unintentional weight loss, skin rash, eye pain, sinus congestion and sinus pain, sore throat, dysphagia,  hemoptysis , cough, dyspnea, wheezing, chest pain, palpitations, orthopnea, edema, abdominal pain, nausea, melena, diarrhea, constipation, flank pain, dysuria, hematuria, urinary  Frequency, nocturia, numbness, tingling, seizures,  Focal weakness, Loss of consciousness,  Tremor, insomnia, depression, anxiety, and suicidal ideation.     History   Social History  . Marital Status: Married    Spouse Name: N/A  . Number of Children: N/A  . Years of Education: N/A  Occupational History  . Not on file.   Social History Main Topics  . Smoking status: Former Smoker    Types: Cigarettes    Quit date: 07/10/1963  . Smokeless tobacco: Never Used  . Alcohol Use: No  . Drug Use: No  . Sexual Activity: Not on file   Other Topics Concern  . Not on file   Social History Narrative    Objective:  Filed Vitals:   01/19/15 1534  BP: 148/72  Pulse: 67  Temp: 98 F (36.7 C)  Resp: 16     General appearance: alert, cooperative and appears stated age Lungs: clear to auscultation bilaterally Heart: regular rate and rhythm, S1,  S2 normal, no murmur, click, rub or gallop Abdomen: soft, non-tender; bowel sounds normal; no masses,  no organomegaly Pulses: 2+ and symmetric Skin: Skin color, texture, turgor normal. No rashes or lesions   Assessment and Plan:  Hypertension We discussed increasing his lisinopril dose to 20 mg daily.  Continue current metoprolol dose,  Do not increase given hr.    Generalized anxiety disorder adding valium today for prn use.     Updated Medication List Outpatient Encounter Prescriptions as of 01/19/2015  Medication Sig  . acetaminophen (TYLENOL) 325 MG tablet Take 650 mg by mouth 2 (two) times daily as needed.  Marland Kitchen alendronate (FOSAMAX) 70 MG tablet TAKE 1 TABLET BY MOUTH WEEKLY WITH A FULL GLASS OF WATER ON AN EMPTY STOMACH  . aspirin 81 MG tablet Take 162 mg by mouth daily.   . Blood Pressure Monitoring (BLOOD PRESSURE MONITOR AUTOMAT) DEVI Use daily to check blood pressure  . Calcium Carbonate-Vitamin D (CALCIUM + D PO) Take by mouth.  . Cholecalciferol (VITAMIN D-3) 1000 UNITS CAPS Take 1 capsule by mouth daily.  Marland Kitchen lisinopril (PRINIVIL,ZESTRIL) 20 MG tablet Take 1 tablet (20 mg total) by mouth daily.  . meclizine (ANTIVERT) 12.5 MG tablet Take 1 tablet (12.5 mg total) by mouth 3 (three) times daily as needed.  . metoprolol tartrate (LOPRESSOR) 12.5 mg TABS tablet TAKE 1/2  TABLET (12.5  MG TOTAL) BY MOUTH 2 (TWO) TIMES DAILY.  . nitroGLYCERIN (NITROSTAT) 0.4 MG SL tablet Place 1 tablet (0.4 mg total) under the tongue every 5 (five) minutes as needed for chest pain.  Marland Kitchen olopatadine (PATANOL) 0.1 % ophthalmic solution Place 1 drop into both eyes 2 (two) times daily.  Marland Kitchen omeprazole (PRILOSEC) 20 MG capsule TAKE 1 CAPSULE (20 MG TOTAL) BY MOUTH DAILY.  . Saw Palmetto, Serenoa repens, (SAW PALMETTO PO) Take 1 tablet by mouth 2 (two) times daily.   . simvastatin (ZOCOR) 20 MG tablet TAKE 1 TABLET BY MOUTH AT BEDTIME  . tamsulosin (FLOMAX) 0.4 MG CAPS capsule Take 1 capsule (0.4 mg total)  by mouth daily after breakfast.  . tolterodine (DETROL LA) 4 MG 24 hr capsule Take 4 mg by mouth daily.  . [DISCONTINUED] lisinopril (PRINIVIL,ZESTRIL) 10 MG tablet TAKE 1 TABLET BY MOUTH TWICE A DAY (Patient taking differently: TAKE 1 TABLET BY MOUTH TWICE A  PRN)  . [DISCONTINUED] lisinopril (PRINIVIL,ZESTRIL) 10 MG tablet Take 2 tablets (20 mg total) by mouth daily.  . diazepam (VALIUM) 5 MG tablet Take 1 tablet (5 mg total) by mouth daily as needed for anxiety.  . Lactulose 20 GM/30ML SOLN 30 ml every 4 hours until constipation is relieved (Patient not taking: Reported on 01/19/2015)  . [DISCONTINUED] Vitamin D, Ergocalciferol, (DRISDOL) 50000 UNITS CAPS capsule TAKE 1 CAPSULE (50,000 UNITS TOTAL) BY MOUTH ONCE A WEEK.  . [  DISCONTINUED] Vitamin D, Ergocalciferol, (DRISDOL) 50000 UNITS CAPS capsule TAKE ONE CAPSULE BY MOUTH ONCE A WEEK  . [DISCONTINUED] Vitamin D, Ergocalciferol, (DRISDOL) 50000 UNITS CAPS capsule TAKE ONE CAPSULE BY MOUTH ONCE A WEEK     Orders Placed This Encounter  Procedures  . Ambulatory referral to Cardiology    Return in about 630 months (around 07/20/2067).

## 2015-01-19 NOTE — Progress Notes (Signed)
Pre-visit discussion using our clinic review tool. No additional management support is needed unless otherwise documented below in the visit note.  

## 2015-01-20 ENCOUNTER — Encounter: Payer: Self-pay | Admitting: Internal Medicine

## 2015-01-20 DIAGNOSIS — F411 Generalized anxiety disorder: Secondary | ICD-10-CM | POA: Insufficient documentation

## 2015-01-20 MED ORDER — LISINOPRIL 20 MG PO TABS: 20.0000 mg | ORAL_TABLET | Freq: Every day | ORAL | Status: DC

## 2015-01-20 NOTE — Assessment & Plan Note (Signed)
adding valium today for prn use.

## 2015-01-20 NOTE — Assessment & Plan Note (Addendum)
We discussed increasing his lisinopril dose to 20 mg daily.  Continue current metoprolol dose,  Do not increase given hr.

## 2015-01-25 ENCOUNTER — Other Ambulatory Visit: Payer: Self-pay | Admitting: Internal Medicine

## 2015-01-27 ENCOUNTER — Ambulatory Visit: Payer: Medicare Other | Admitting: Cardiovascular Disease

## 2015-02-03 ENCOUNTER — Encounter: Payer: Self-pay | Admitting: Cardiovascular Disease

## 2015-02-03 ENCOUNTER — Telehealth: Payer: Self-pay | Admitting: Internal Medicine

## 2015-02-03 ENCOUNTER — Ambulatory Visit (INDEPENDENT_AMBULATORY_CARE_PROVIDER_SITE_OTHER): Payer: Medicare Other | Admitting: Cardiovascular Disease

## 2015-02-03 VITALS — BP 122/70 | HR 80 | Ht 70.0 in | Wt 203.5 lb

## 2015-02-03 DIAGNOSIS — E663 Overweight: Secondary | ICD-10-CM | POA: Diagnosis not present

## 2015-02-03 DIAGNOSIS — E785 Hyperlipidemia, unspecified: Secondary | ICD-10-CM

## 2015-02-03 DIAGNOSIS — I10 Essential (primary) hypertension: Secondary | ICD-10-CM

## 2015-02-03 DIAGNOSIS — I6523 Occlusion and stenosis of bilateral carotid arteries: Secondary | ICD-10-CM

## 2015-02-03 DIAGNOSIS — I2510 Atherosclerotic heart disease of native coronary artery without angina pectoris: Secondary | ICD-10-CM | POA: Diagnosis not present

## 2015-02-03 MED ORDER — HYDRALAZINE HCL 25 MG PO TABS: 25.0000 mg | ORAL_TABLET | Freq: Three times a day (TID) | ORAL | Status: DC | PRN

## 2015-02-03 MED ORDER — LISINOPRIL 40 MG PO TABS: 40.0000 mg | ORAL_TABLET | Freq: Every day | ORAL | Status: DC

## 2015-02-03 NOTE — Assessment & Plan Note (Signed)
We have encouraged continued exercise, careful diet management in an effort to lose weight. 

## 2015-02-03 NOTE — Assessment & Plan Note (Addendum)
Blood pressure is well controlled on today's visit. No changes made to the medications. Suggested he take hydralazine for systolic pressures more than 160 on repeat checks For the most part it sounds like his blood pressure has been recently well controlled

## 2015-02-03 NOTE — Progress Notes (Signed)
Patient ID: David Atkins, male    DOB: 1929/05/22, 81 y.o.   MRN: [id number]  HPI Comments: David Atkins is a very pleasant 81 year old gentleman with a history of coronary artery disease, bypass surgery in 2006 at [facility] in [place], mild carotid arterial disease by report, history of hyperlipidemia, small smoking history in the remote past, who presents for routine followup of David coronary artery disease . He is a patient of Dr. Derrel Nip.  In follow-up today, he reports that he is doing relatively well. Occasionally has periods of stress which pushes David blood pressure upwards. Recent changes to David blood pressure medications, now seems to be running better. He has 2 blood pressure cuffs and reports systolic pressure 947S up to 140 Occasionally takes hydralazine for very high blood pressure that does not improve on recheck  EKG on today's visit shows normal sinus rhythm with rate 80 bpm, nonspecific T wave abnormality  Other past medical history  01/06/2014, he reported that he had general malaise, blood pressure checked showed systolic pressure greater than 200. He went to the emergency room for further evaluation. Workup there was negative including normal basic metabolic panel, negative cardiac enzymes. CT scan showed chronic microvascular changes in the deep white matter, and he was discharged home after medical management of David blood pressure. On a previous visit, David Atkins was concerned about various arguments he may be having with other family members at home and the stress this may be causing him.   He does continue to have periodic leg swelling, appears relatively stable  Laboratory shows total cholesterol 129, LDL 71    Allergies  Allergen Reactions  . Sulfa Antibiotics     Outpatient Encounter Prescriptions as of 02/03/2015  Medication Sig  . acetaminophen (TYLENOL) 325 MG tablet Take 650 mg by mouth 2 (two) times daily as needed.  Marland Kitchen alendronate (FOSAMAX)  70 MG tablet TAKE 1 TABLET BY MOUTH WEEKLY WITH A FULL GLASS OF WATER ON AN EMPTY STOMACH  . aspirin 81 MG tablet Take 81 mg by mouth daily.   . Blood Pressure Monitoring (BLOOD PRESSURE MONITOR AUTOMAT) DEVI Use daily to check blood pressure  . Calcium Carbonate-Vitamin D (CALCIUM + D PO) Take by mouth.  . Cholecalciferol (VITAMIN D-3) 1000 UNITS CAPS Take 1 capsule by mouth daily.  . diazepam (VALIUM) 5 MG tablet Take 1 tablet (5 mg total) by mouth daily as needed for anxiety.  Marland Kitchen lisinopril (PRINIVIL,ZESTRIL) 40 MG tablet Take 1 tablet (40 mg total) by mouth daily.  . meclizine (ANTIVERT) 12.5 MG tablet Take 1 tablet (12.5 mg total) by mouth 3 (three) times daily as needed.  . metoprolol tartrate (LOPRESSOR) 12.5 mg TABS tablet TAKE 1/2  TABLET (12.5  MG TOTAL) BY MOUTH 2 (TWO) TIMES DAILY.  . nitroGLYCERIN (NITROSTAT) 0.4 MG SL tablet Place 1 tablet (0.4 mg total) under the tongue every 5 (five) minutes as needed for chest pain.  Marland Kitchen omeprazole (PRILOSEC) 20 MG capsule TAKE 1 CAPSULE (20 MG TOTAL) BY MOUTH DAILY.  . Saw Palmetto, Serenoa repens, (SAW PALMETTO PO) Take 1 tablet by mouth 2 (two) times daily.   . simvastatin (ZOCOR) 20 MG tablet TAKE 1 TABLET BY MOUTH AT BEDTIME  . tamsulosin (FLOMAX) 0.4 MG CAPS capsule Take 1 capsule (0.4 mg total) by mouth daily after breakfast.  . tolterodine (DETROL LA) 4 MG 24 hr capsule Take 4 mg by mouth daily.  . hydrALAZINE (APRESOLINE) 25 MG tablet Take 1 tablet (25 mg  total) by mouth 3 (three) times daily as needed.  . [DISCONTINUED] Lactulose 20 GM/30ML SOLN 30 ml every 4 hours until constipation is relieved (Patient not taking: Reported on 02/03/2015)  . [DISCONTINUED] olopatadine (PATANOL) 0.1 % ophthalmic solution Place 1 drop into both eyes 2 (two) times daily. (Patient not taking: Reported on 02/03/2015)    Past Medical History  Diagnosis Date  . Osteoporosis 2010    by DEXA at [facility]  . BPH (benign prostatic hyperplasia)   . Hypertension   .  Myocardial infarction 2006  . basal cell   . 3-vessel coronary artery disease 2006    [place]  . Lumbar stenosis 12/03/13    MRI - lumbar pain with radiation down to LE  . Spondylolisthesis of lumbar region 11/23/13    MRI Lumbar spine  . OAB (overactive bladder)     Past Surgical History  Procedure Laterality Date  . Appendectomy  1951  . Nasal septoplasty w/ turbinoplasty  1981  . Coronary artery bypass graft  2006    3 vessel, s/p AMI    Social History  reports that he quit smoking about 51 years ago. David smoking use included Cigarettes. He has never used smokeless tobacco. He reports that he does not drink alcohol or use illicit drugs.  Family History family history includes Hyperlipidemia in David mother; Hypertension in David father and mother.   Review of Systems  Constitutional: Negative.   Respiratory: Negative.   Gastrointestinal: Negative.   Musculoskeletal: Positive for gait problem.  Skin: Negative.   Neurological: Negative.   Hematological: Negative.   Psychiatric/Behavioral: Negative.   All other systems reviewed and are negative.   BP 122/70 mmHg  Pulse 80  Ht 5\' 10"  (1.778 m)  Wt 203 lb 8 oz (92.307 kg)  BMI 29.20 kg/m2  Physical Exam  Constitutional: He is oriented to person, place, and time. He appears well-developed and well-nourished.  HENT:  Head: Normocephalic.  Nose: Nose normal.  Mouth/Throat: Oropharynx is clear and moist.  Eyes: Conjunctivae are normal. Pupils are equal, round, and reactive to light.  Neck: Normal range of motion. Neck supple. No JVD present.  Cardiovascular: Normal rate, regular rhythm, S1 normal, S2 normal, normal heart sounds and intact distal pulses.  Exam reveals no gallop and no friction rub.   No murmur heard. Trace minimal pitting edema bilateral ankle area  Pulmonary/Chest: Effort normal and breath sounds normal. No respiratory distress. He has no wheezes. He has no rales. He exhibits no tenderness.  Abdominal:  Soft. Bowel sounds are normal. He exhibits no distension. There is no tenderness.  Musculoskeletal: Normal range of motion. He exhibits no edema or tenderness.  Lymphadenopathy:    He has no cervical adenopathy.  Neurological: He is alert and oriented to person, place, and time. Coordination normal.  Skin: Skin is warm and dry. No rash noted. No erythema.  Psychiatric: He has a normal mood and affect. David behavior is normal. Judgment and thought content normal.      Assessment and Plan   Nursing note and vitals reviewed.

## 2015-02-03 NOTE — Telephone Encounter (Signed)
My chart message sent:  I have reviewed your BP readings.  Please continue the regimen of 2 Lisinopril (40 mg total) and 1/2 metoprolol daily.  Regards,  Dr. Derrel Nip  new rx for 40 mg lisinopril sent to pharmacy

## 2015-02-03 NOTE — Assessment & Plan Note (Signed)
Note indicates underlying carotid stenosis. We'll discuss with him whether he would like a designated  carotid ultrasound

## 2015-02-03 NOTE — Assessment & Plan Note (Signed)
Cholesterol is at goal on the current lipid regimen. No changes to the medications were made.  

## 2015-02-03 NOTE — Assessment & Plan Note (Signed)
Currently with no symptoms of angina. No further workup at this time. Continue current medication regimen. 

## 2015-02-03 NOTE — Patient Instructions (Signed)
You are doing well. No medication changes were made.  Please take hydralazine as needed for high blood pressure  Please call us if you have new issues that need to be addressed before your next appt.  Your physician wants you to follow-up in: 6 months.  You will receive a reminder letter in the mail two months in advance. If you don't receive a letter, please call our office to schedule the follow-up appointment.

## 2015-02-04 ENCOUNTER — Other Ambulatory Visit: Payer: Self-pay

## 2015-02-04 DIAGNOSIS — I25118 Atherosclerotic heart disease of native coronary artery with other forms of angina pectoris: Secondary | ICD-10-CM

## 2015-02-04 NOTE — Telephone Encounter (Signed)
Left message with patient wife to call office and confirm he received the  Email.

## 2015-02-08 ENCOUNTER — Encounter: Payer: Self-pay | Admitting: *Deleted

## 2015-02-08 NOTE — Telephone Encounter (Signed)
Letter mailed

## 2015-02-16 ENCOUNTER — Encounter (INDEPENDENT_AMBULATORY_CARE_PROVIDER_SITE_OTHER): Payer: Medicare Other

## 2015-02-16 DIAGNOSIS — I6523 Occlusion and stenosis of bilateral carotid arteries: Secondary | ICD-10-CM

## 2015-02-16 DIAGNOSIS — I25118 Atherosclerotic heart disease of native coronary artery with other forms of angina pectoris: Secondary | ICD-10-CM

## 2015-02-23 DIAGNOSIS — H6123 Impacted cerumen, bilateral: Secondary | ICD-10-CM | POA: Diagnosis not present

## 2015-02-23 DIAGNOSIS — B379 Candidiasis, unspecified: Secondary | ICD-10-CM | POA: Diagnosis not present

## 2015-03-09 ENCOUNTER — Other Ambulatory Visit: Payer: Self-pay | Admitting: Internal Medicine

## 2015-03-15 ENCOUNTER — Other Ambulatory Visit: Payer: Self-pay | Admitting: Internal Medicine

## 2015-03-19 DIAGNOSIS — C44311 Basal cell carcinoma of skin of nose: Secondary | ICD-10-CM | POA: Diagnosis not present

## 2015-03-19 DIAGNOSIS — L570 Actinic keratosis: Secondary | ICD-10-CM | POA: Diagnosis not present

## 2015-03-19 DIAGNOSIS — D485 Neoplasm of uncertain behavior of skin: Secondary | ICD-10-CM | POA: Diagnosis not present

## 2015-03-19 DIAGNOSIS — Z85828 Personal history of other malignant neoplasm of skin: Secondary | ICD-10-CM | POA: Diagnosis not present

## 2015-03-20 ENCOUNTER — Other Ambulatory Visit: Payer: Self-pay | Admitting: Internal Medicine

## 2015-03-26 NOTE — Op Note (Signed)
PATIENT NAME:  David Atkins, David Atkins MR#:  [id number] DATE OF BIRTH:  [birth year]  DATE OF PROCEDURE:  12/01/2012  PREOPERATIVE DIAGNOSES: 1.  Dysphagia. 2.  Possible foreign body.   POSTOPERATIVE DIAGNOSES: 1.  Dysphagia. 2.  Possible foreign body.  PROCEDURE: Flexible fiber-optic nasal laryngoscopy.   SURGEON: Deveon Kisiel. Richardson Landry, MD.  INDICATIONS: The patient swallowed pill last night and has had some throat discomfort and difficulty swallowing since, although he is currently doing better and is actually able to swallow liquids. CT scan suggested there could be a foreign body in the left vallecula.   DESCRIPTION OF PROCEDURE: After discussing the procedure with the patient, the scope was gently passed through the right nasal cavity. The nasal cavity and nasopharynx were found to be clear. The hypopharynx, larynx and tongue base were carefully inspected. Vocal cords were clear and mobile, except for maybe some mild edema. There was some edema of the post-cricoid region without foreign body. He had a little bit of pooling secretions in the piriform sinus, but I was able to get him to clear this with swallowing water. There was no evidence of any foreign body there. Water passed without difficulty. In the left vallecula he had about a 1 cm mucosally covered benign mucous retention cyst. There was no evidence of any foreign body in the vallecula at all, however. The tongue base was otherwise unremarkable. The epiglottis was unremarkable. The scope was withdrawn.  The patient tolerated the procedure well.           ASSESSMENT: No evidence of foreign body in the hypopharynx. However, the patient does have some mild edema of the post-cricoid tissues where the pill likely irritated the mucosa.   ____________________________ Sammuel Hines. Richardson Landry, MD psb:eg D: 12/01/2012 14:51:47 ET T: 12/01/2012 19:01:33 ET JOB#: [id number]  cc: Sammuel Hines. Richardson Landry, MD, <Dictator> Riley Nearing MD ELECTRONICALLY SIGNED  12/24/2012 8:53

## 2015-03-26 NOTE — Consult Note (Signed)
PATIENT NAME:  David Atkins, David Atkins MR#:  [id number] DATE OF BIRTH:  01-01-1929  DATE OF CONSULTATION:  12/01/2012  REQUESTING PHYSICIAN:  Cari B. Triplett, NP.  REASON FOR CONSULTATION: Possible foreign body.   HISTORY OF PRESENT ILLNESS: This 81 year old male was in the Emergency Room with difficulty swallowing. He swallowed a vitamin C tablet last night and felt like it might have gotten stuck. He has actually been able to swallow liquids and he says his throat is beginning to feel better since he has been in the Emergency Room. CT scan was performed and they did see a radio opaque abnormality in the left vallecular region. There were no abnormalities in the pharynx. There was also some soft tissue attenuation in the vallecula.   PAST MEDICAL HISTORY:  1.  Hypertension.  2.  Hyperlipidemia.  3.  Bladder problems. 4.  History of heart disease, status post coronary artery bypass graft.  5.  Sleep apnea.  PAST SURGICAL HISTORY: Appendectomy, coronary artery bypass graft.   ALLERGIES: SULFA drugs.  MEDICATIONS: 1.  Vitamin C 500 mg p.o. daily. 2.  Tylenol p.r.n.  3.  Simvastatin 20 mg p.o. daily. 4.  Sol palmetto 450 mg 4 times daily. 5.  Robaxin 500 mg 2 p.o. q.i.d.  6.  Mobic 7.5 mg p.o. daily.  7.  Metoprolol one p.o. b.i.d. 8.  Lisinopril 10 mg p.o. daily.  9.  Ibuprofen 20 mg p.r.n.  10.  Doxazosin 8 mg p.o. b.i.d.   11.  Detrol LA 1 tablet p.o. daily. 12.  Calcium 600 mg daily. 13.  Aspirin 325 mg p.o. daily.   SOCIAL HISTORY: The patient is a nonsmoker.  No alcohol use.   REVIEW OF SYSTEMS: The patient has not had any shortness of breath, chest pain, nausea, vomiting.  Denies any frank heartburn. No fever. He does report having had a stricture in the esophagus, although in speaking with him, there was never been any evaluation, such as a barium swallow or an upper GI endoscopy performed.   PHYSICAL EXAMINATION:  VITAL SIGNS:  Temperature 98, pulse 76, blood pressure 143/76.   GENERAL: Well-developed, well-nourished male in no acute distress.  HEENT:  Normocephalic, atraumatic. No facial skin lesions. Facial strength is normal and symmetric. External ears are unremarkable. Ear canals have a little bit of cerumen in them, but are otherwise clear with normal TMs. Nasal examination, mucosa is free of congestion. No purulence or polyps are seen. The septum is straight. Oral cavity and oropharynx: Teeth, lips, and gums unremarkable. Tongue and floor of mouth without lesions. Posterior pharynx is clear. He has 1+ tonsils. The right tonsil may be slightly more prominent than the left, but there is no mass lesion, exudate, erythema, edema or leukoplakia.  NECK: Supple without adenopathy or masses. No thyromegaly. Salivary glands are soft and nontender without masses.  NEUROLOGIC: Cranial nerves II through XII are grossly intact.   DIAGNOSTIC DATA: I did review his CT scan. There is a calcified density in the vallecula. This could correlate with a calcified portion of the epiglottis. There also appears to be a little bit of edema in the post-cricoid region, but no definite foreign body there. No other significant abnormalities are seen.   PROCEDURE:  Fiber-optic nasal laryngoscopy was performed. The hypopharynx, larynx and tongue base were carefully inspected. Vocal cords were clear and mobile, just maybe some mild edema from irritation. There is edema of the post-cricoid region without any evidence of foreign body. Initially there was a little pooling  of secretions seen in the vallecula, but this cleared with swallowing water and the patient was able to pass water without difficulty. There was absolutely no evidence of any foreign body in the vallecula or in the piriform sinus. In the left vallecula, he does have a 1 cm mucosally covered vallecular cyst, completely benign in appearance. There is no evidence of any foreign body in the vallecula.   ASSESSMENT: This patient has no  evidence of a foreign body in the vallecula or the remainder of the hypopharynx. There is some edema of the post-cricoid region. This was a vitamin C pill, which most likely became briefly lodged dissolved and subsequently passed. He is now drinking water freely without any difficulty. I cannot.   PLAN: I cannot absolutely exclude an esophageal foreign body and discussed this with the patient and his family. At this point, completely excluding an esophageal foreign body would require GI consultation with a sedated upper gastrointestinal endoscopy. Given the fact that he is passing liquids without difficulty and actually feels better, I think this is probably unnecessary, however, he may want to follow up with Dr. Derrel Nip and discuss consideration of referral to a gastroenterologist for upper GI endoscopy since he has had some ongoing issues with swallowing foods. His family reports that he has had to eat small bites for quite a while now. Alternatively at least a barium swallow could be considered as initial work-up for this. I recommended liquid diet until his throat feels better, advancing next to a soft diet and back to a regular diet, as tolerated. Magic mouthwash will be prescribed by the ER physician to help soothe the throat and they are going to give him some Decadron to help with the minor swelling that was seen. He needs to contact the pharmacy, to see which of his pills may be crushed and he is going to look into getting chewable vitamins, which would be easier for him to swallow. No specific ENT follow-up is necessary.    ____________________________ Sammuel Hines. Richardson Landry, MD psb:eg D: 12/01/2012 14:49:34 ET T: 12/01/2012 20:57:07 ET JOB#: [id number]  cc:  1.  Sammuel Hines. Richardson Landry, MD, <Dictator> 2.  Deborra Medina, MD   Riley Nearing MD ELECTRONICALLY SIGNED 12/24/2012 8:53

## 2015-04-20 ENCOUNTER — Other Ambulatory Visit: Payer: Self-pay | Admitting: Internal Medicine

## 2015-04-23 ENCOUNTER — Other Ambulatory Visit: Payer: Self-pay | Admitting: Internal Medicine

## 2015-04-28 DIAGNOSIS — C44311 Basal cell carcinoma of skin of nose: Secondary | ICD-10-CM | POA: Diagnosis not present

## 2015-04-28 DIAGNOSIS — L908 Other atrophic disorders of skin: Secondary | ICD-10-CM | POA: Diagnosis not present

## 2015-04-28 DIAGNOSIS — L814 Other melanin hyperpigmentation: Secondary | ICD-10-CM | POA: Diagnosis not present

## 2015-04-28 DIAGNOSIS — L578 Other skin changes due to chronic exposure to nonionizing radiation: Secondary | ICD-10-CM | POA: Diagnosis not present

## 2015-05-04 ENCOUNTER — Other Ambulatory Visit: Payer: Self-pay | Admitting: Internal Medicine

## 2015-05-14 ENCOUNTER — Other Ambulatory Visit: Payer: Self-pay | Admitting: Internal Medicine

## 2015-05-21 ENCOUNTER — Telehealth: Payer: Self-pay

## 2015-05-21 MED ORDER — LISINOPRIL 20 MG PO TABS: 20.0000 mg | ORAL_TABLET | Freq: Every day | ORAL | Status: DC

## 2015-05-21 NOTE — Telephone Encounter (Signed)
Yes,  rx for 20 mg sent to [facility]

## 2015-05-21 NOTE — Telephone Encounter (Signed)
Pt is currently taking 10mg , 2 times daily. He would like to take 20mg  once daily. Please advise on dosage change

## 2015-05-21 NOTE — Telephone Encounter (Signed)
Spoke with pt, advised of MDs message.  Pt verbalized understanding 

## 2015-05-21 NOTE — Telephone Encounter (Signed)
The patient came in and wanted to ask if he could have his lisinopril refilled with 20mg  each day.  He stated [facility] has this tablet available.

## 2015-05-21 NOTE — Telephone Encounter (Signed)
Pt had been told previously to take 40mg , med list shows 10 mg 2 times daily. Please advise

## 2015-05-21 NOTE — Telephone Encounter (Signed)
Start with 20 mg daiy as prescribed,  And check BP to see if at goal. If not < 130/80, increase to twice daily

## 2015-07-11 ENCOUNTER — Other Ambulatory Visit: Payer: Self-pay | Admitting: Internal Medicine

## 2015-07-13 ENCOUNTER — Other Ambulatory Visit: Payer: Self-pay | Admitting: Internal Medicine

## 2015-08-19 DIAGNOSIS — L570 Actinic keratosis: Secondary | ICD-10-CM | POA: Diagnosis not present

## 2015-08-19 DIAGNOSIS — Z85828 Personal history of other malignant neoplasm of skin: Secondary | ICD-10-CM | POA: Diagnosis not present

## 2015-09-06 ENCOUNTER — Other Ambulatory Visit: Payer: Self-pay | Admitting: Internal Medicine

## 2015-09-20 ENCOUNTER — Other Ambulatory Visit: Payer: Self-pay | Admitting: Internal Medicine

## 2015-09-20 NOTE — Telephone Encounter (Signed)
Please advise, Last OV 01/19/15?

## 2015-09-23 ENCOUNTER — Other Ambulatory Visit: Payer: Self-pay | Admitting: Internal Medicine

## 2015-10-08 ENCOUNTER — Other Ambulatory Visit: Payer: Self-pay | Admitting: Internal Medicine

## 2015-10-15 ENCOUNTER — Other Ambulatory Visit: Payer: Self-pay | Admitting: Internal Medicine

## 2015-10-31 ENCOUNTER — Encounter (HOSPITAL_COMMUNITY): Payer: Self-pay | Admitting: Emergency Medicine

## 2015-10-31 ENCOUNTER — Other Ambulatory Visit: Payer: Self-pay | Admitting: Internal Medicine

## 2015-10-31 ENCOUNTER — Emergency Department (HOSPITAL_COMMUNITY): Payer: Medicare Other

## 2015-10-31 ENCOUNTER — Emergency Department (HOSPITAL_COMMUNITY)
Admission: EM | Admit: 2015-10-31 | Discharge: 2015-11-01 | Disposition: A | Discharge status: Home / Self Care | Payer: Medicare Other | Attending: Emergency Medicine | Admitting: Emergency Medicine

## 2015-10-31 DIAGNOSIS — K5900 Constipation, unspecified: Secondary | ICD-10-CM | POA: Diagnosis not present

## 2015-10-31 DIAGNOSIS — N400 Enlarged prostate without lower urinary tract symptoms: Secondary | ICD-10-CM | POA: Diagnosis not present

## 2015-10-31 DIAGNOSIS — Y92095 Swimming-pool of other non-institutional residence as the place of occurrence of the external cause: Secondary | ICD-10-CM | POA: Insufficient documentation

## 2015-10-31 DIAGNOSIS — I10 Essential (primary) hypertension: Secondary | ICD-10-CM | POA: Insufficient documentation

## 2015-10-31 DIAGNOSIS — S32019A Unspecified fracture of first lumbar vertebra, initial encounter for closed fracture: Secondary | ICD-10-CM | POA: Insufficient documentation

## 2015-10-31 DIAGNOSIS — Z8589 Personal history of malignant neoplasm of other organs and systems: Secondary | ICD-10-CM | POA: Insufficient documentation

## 2015-10-31 DIAGNOSIS — Z79899 Other long term (current) drug therapy: Secondary | ICD-10-CM | POA: Diagnosis not present

## 2015-10-31 DIAGNOSIS — Z7982 Long term (current) use of aspirin: Secondary | ICD-10-CM | POA: Insufficient documentation

## 2015-10-31 DIAGNOSIS — I252 Old myocardial infarction: Secondary | ICD-10-CM | POA: Diagnosis not present

## 2015-10-31 DIAGNOSIS — Y999 Unspecified external cause status: Secondary | ICD-10-CM | POA: Insufficient documentation

## 2015-10-31 DIAGNOSIS — M47816 Spondylosis without myelopathy or radiculopathy, lumbar region: Secondary | ICD-10-CM | POA: Insufficient documentation

## 2015-10-31 DIAGNOSIS — N3281 Overactive bladder: Secondary | ICD-10-CM | POA: Diagnosis not present

## 2015-10-31 DIAGNOSIS — R1084 Generalized abdominal pain: Secondary | ICD-10-CM

## 2015-10-31 DIAGNOSIS — Z87891 Personal history of nicotine dependence: Secondary | ICD-10-CM | POA: Insufficient documentation

## 2015-10-31 DIAGNOSIS — Y93E5 Activity, floor mopping and cleaning: Secondary | ICD-10-CM | POA: Diagnosis not present

## 2015-10-31 DIAGNOSIS — W173XXA Fall into empty swimming pool, initial encounter: Secondary | ICD-10-CM | POA: Insufficient documentation

## 2015-10-31 DIAGNOSIS — I2510 Atherosclerotic heart disease of native coronary artery without angina pectoris: Secondary | ICD-10-CM | POA: Insufficient documentation

## 2015-10-31 LAB — CBC WITH DIFFERENTIAL/PLATELET
Basophils Absolute: 0 10*3/uL (ref 0.0–0.1)
Basophils Relative: 1 %
Eosinophils Absolute: 0.3 10*3/uL (ref 0.0–0.7)
Eosinophils Relative: 5 %
HCT: 41.7 % (ref 39.0–52.0)
Hemoglobin: 14 g/dL (ref 13.0–17.0)
Lymphocytes Relative: 31 %
Lymphs Abs: 2 10*3/uL (ref 0.7–4.0)
MCH: 31.8 pg (ref 26.0–34.0)
MCHC: 33.6 g/dL (ref 30.0–36.0)
MCV: 94.8 fL (ref 78.0–100.0)
Monocytes Absolute: 0.5 10*3/uL (ref 0.1–1.0)
Monocytes Relative: 8 %
Neutro Abs: 3.7 10*3/uL (ref 1.7–7.7)
Neutrophils Relative %: 57 %
Platelets: 230 10*3/uL (ref 150–400)
RBC: 4.4 MIL/uL (ref 4.22–5.81)
RDW: 12.8 % (ref 11.5–15.5)
WBC: 6.5 10*3/uL (ref 4.0–10.5)

## 2015-10-31 LAB — COMPREHENSIVE METABOLIC PANEL
ALT: 23 U/L (ref 17–63)
AST: 23 U/L (ref 15–41)
Albumin: 3.3 g/dL — ABNORMAL LOW (ref 3.5–5.0)
Alkaline Phosphatase: 55 U/L (ref 38–126)
Anion gap: 8 (ref 5–15)
BUN: 11 mg/dL (ref 6–20)
CO2: 23 mmol/L (ref 22–32)
Calcium: 8.6 mg/dL — ABNORMAL LOW (ref 8.9–10.3)
Chloride: 109 mmol/L (ref 101–111)
Creatinine, Ser: 1.01 mg/dL (ref 0.61–1.24)
GFR calc Af Amer: >60 mL/min (ref >60)
GFR calc non Af Amer: >60 mL/min (ref >60)
Glucose, Bld: 86 mg/dL (ref 65–99)
Potassium: 3.6 mmol/L (ref 3.5–5.1)
Sodium: 140 mmol/L (ref 135–145)
Total Bilirubin: 0.6 mg/dL (ref 0.3–1.2)
Total Protein: 6 g/dL — ABNORMAL LOW (ref 6.5–8.1)

## 2015-10-31 LAB — URINALYSIS, ROUTINE W REFLEX MICROSCOPIC
Bilirubin Urine: NEGATIVE
Glucose, UA: NEGATIVE mg/dL
Hgb urine dipstick: NEGATIVE
Ketones, ur: 15 mg/dL — AB
Leukocytes, UA: NEGATIVE
Nitrite: NEGATIVE
Protein, ur: NEGATIVE mg/dL
Specific Gravity, Urine: 1.021 (ref 1.005–1.030)
pH: 6 (ref 5.0–8.0)

## 2015-10-31 LAB — LIPASE, BLOOD: Lipase: 24 U/L (ref 11–51)

## 2015-10-31 MED ORDER — ACETAMINOPHEN 325 MG PO TABS
650.0000 mg | ORAL_TABLET | Freq: Once | ORAL | Status: AC
  Administered 2015-10-31: 650 mg via ORAL
  Filled 2015-10-31: qty 2

## 2015-10-31 MED ORDER — IOHEXOL 300 MG/ML  SOLN
100.0000 mL | Freq: Once | INTRAMUSCULAR | Status: AC | PRN
  Administered 2015-10-31: 100 mL via INTRAVENOUS

## 2015-10-31 MED ORDER — MILK AND MOLASSES ENEMA
1.0000 | Freq: Once | RECTAL | Status: AC
  Administered 2015-11-01: 250 mL via RECTAL
  Filled 2015-10-31 (×2): qty 250

## 2015-10-31 NOTE — ED Notes (Signed)
Pt reports that he was working on the side of his spring pool and fell backwards into the pool. Pt reports since the fall he has been constipated. Last BM was before the fall unsure exactly what day. Pt tried phillips milk of mag with some relief.

## 2015-10-31 NOTE — ED Provider Notes (Signed)
CSN: [id number]     Arrival date & time 10/31/15  1614 History   First MD Initiated Contact with Patient 10/31/15 1843     Chief Complaint  Patient presents with  . Fall  . Constipation     (Consider location/radiation/quality/duration/timing/severity/associated sxs/prior Treatment) HPI 81 year old male who presents with constipation. History of appendectomy, CAD, lumbar stenosis, and HTN. States that one week ago had a mechanical fall onto his back into his pool while cleaning his swimming pool. He did not have LOC. States that he was able to get out of the pool and felt like his normal self and resume daily activities. However, since 7 days ago has not had a bowel movement. No rectal pain or pressure, no nausea or vomiting, normal appetite, no fever or chills, no abdominal distension. Intermittent abdominal cramping. Denies dysuria, urinary frequency, headache, vision or speech changes, numbness or weakness, back pain or neck pain.    Past Medical History  Diagnosis Date  . Osteoporosis 2010    by DEX[facility] at [facility]  . BPH (benign prostatic hyperplasia)   . Hypertension   . Myocardial infarction ([place]) 2006  . basal cell   . 3-vessel coronary artery disease 2006    [place]  . Lumbar stenosis 12/03/13    MRI - lumbar pain with radiation down to LE  . Spondylolisthesis of lumbar region 11/23/13    MRI Lumbar spine  . OAB (overactive bladder)    Past Surgical History  Procedure Laterality Date  . Appendectomy  1951  . Nasal septoplasty w/ turbinoplasty  1981  . Coronary artery bypass graft  2006    3 vessel, s/p AMI   Family History  Problem Relation Age of Onset  . Hyperlipidemia Mother   . Hypertension Mother   . Hypertension Father    Social History  Substance Use Topics  . Smoking status: Former Smoker    Types: Cigarettes    Quit date: 07/10/1963  . Smokeless tobacco: Never Used  . Alcohol Use: No    Review of Systems 10/14 systems reviewed and are negative other  than those stated in the HPI   Allergies  Sulfa antibiotics  Home Medications   Prior to Admission medications   Medication Sig Start Date End Date Taking? Authorizing Provider  acetaminophen (TYLENOL) 325 MG tablet Take 650 mg by mouth 2 (two) times daily as needed.   Yes Historical Provider, MD  aspirin 81 MG tablet Take 81 mg by mouth daily.    Yes Historical Provider, MD  Calcium Carbonate-Vitamin D (CALCIUM + D PO) Take 1 tablet by mouth daily.    Yes Historical Provider, MD  Cholecalciferol (VITAMIN D-3) 1000 UNITS CAPS Take 1 capsule by mouth daily.   Yes Historical Provider, MD  diazepam (VALIUM) 5 MG tablet Take 1 tablet (5 mg total) by mouth daily as needed for anxiety. 01/19/15  Yes [name], MD  meclizine (ANTIVERT) 12.5 MG tablet TAKE 1 TABLET (12.5 MG TOTAL) BY MOUTH 3 (THREE) TIMES DAILY AS NEEDED. 10/15/15  Yes [name], MD  metoprolol tartrate (LOPRESSOR) 25 MG tablet TAKE 1/2 TABLET (25 MG TOTAL) BY MOUTH 2 (TWO) TIMES DAILY. 09/23/15  Yes [name], MD  omeprazole (PRILOSEC) 20 MG capsule TAKE 1 CAPSULE (20 MG TOTAL) BY MOUTH DAILY. 10/08/15  Yes [name], MD  Saw Palmetto, Serenoa repens, (SAW PALMETTO PO) Take 1 tablet by mouth 2 (two) times daily.    Yes Historical Provider, MD  simvastatin (ZOCOR) 20 MG tablet TAKE 1 TABLET BY MOUTH AT BEDTIME 03/15/15  Yes [name], MD  tamsulosin (FLOMAX) 0.4 MG CAPS capsule TAKE 1 CAPSULE BY MOUTH DAILY AFTER BREAKFAST. 07/12/15  Yes [name], MD  tolterodine (DETROL LA) 4 MG 24 hr capsule Take 4 mg by mouth daily.   Yes Historical Provider, MD  alendronate (FOSAMAX) 70 MG tablet TAKE 1 TABLET BY MOUTH WEEKLY WITH A FULL GLASS OF WATER ON AN EMPTY STOMACH Patient not taking: Reported on 10/31/2015 09/06/15   [name], MD  Blood Pressure Monitoring (BLOOD PRESSURE MONITOR AUTOMAT) DEVI Use daily to check blood pressure 11/25/14   [name], MD  docusate sodium (COLACE) 100 MG capsule Take 1  capsule (100 mg total) by mouth 2 (two) times daily. 11/01/15   Julianne Rice, MD  lisinopril (PRINIVIL,ZESTRIL) 20 MG tablet TAKE 1 TABLET (20 MG TOTAL) BY MOUTH DAILY. 11/01/15   [name], MD  nitroGLYCERIN (NITROSTAT) 0.4 MG SL tablet Place 1 tablet (0.4 mg total) under the tongue every 5 (five) minutes as needed for chest pain. 01/09/14   Minna Merritts, MD  polyethylene glycol powder (GLYCOLAX/MIRALAX) powder Take 17 g by mouth once. Dissolve 1 capful of power into any liquid and drink once daily 11/01/15   Julianne Rice, MD   BP 119/65 mmHg  Pulse 82  Temp(Src) 98.3 F (36.8 C) (Oral)  Resp 16  Ht 5\' 10"  (1.778 m)  Wt 199 lb 9.6 oz (90.538 kg)  BMI 28.64 kg/m2  SpO2 97% Physical Exam Physical Exam  Nursing note and vitals reviewed. Constitutional: Elderly appearing man, well developed, well nourished, non-toxic, and in no acute distress Head: Normocephalic and atraumatic.  Mouth/Throat: Oropharynx is clear and moist.  Neck: Normal range of motion. Neck supple.  Cardiovascular: Normal rate and regular rhythm.   Pulmonary/Chest: Effort normal and breath sounds normal. No chest wall tenderness. Abdominal: Soft. Obese. There generalized tenderness. There is no rebound and no guarding. No CVA tenderness. Musculoskeletal: Normal range of motion of all extremities. No CTLS spine tenderness.  Neurological: Alert, no facial droop, fluent speech, moves all extremities symmetrically Skin: Skin is warm and dry.  Psychiatric: Cooperative  ED Course  Procedures (including critical care time) Labs Review Labs Reviewed  URINALYSIS, ROUTINE W REFLEX MICROSCOPIC (NOT AT [facility]) - Abnormal; Notable for the following:    Ketones, ur 15 (*)    All other components within normal limits  COMPREHENSIVE METABOLIC PANEL - Abnormal; Notable for the following:    Calcium 8.6 (*)    Total Protein 6.0 (*)    Albumin 3.3 (*)    All other components within normal limits  CBC WITH  DIFFERENTIAL/PLATELET  LIPASE, BLOOD  CBG MONITORING, ED    Imaging Review Ct Abdomen Pelvis W Contrast  10/31/2015  CLINICAL DATA:  Status post fall backwards in pool. Constipation. Initial encounter. EXAM: CT ABDOMEN AND PELVIS WITH CONTRAST TECHNIQUE: Multidetector CT imaging of the abdomen and pelvis was performed using the standard protocol following bolus administration of intravenous contrast. CONTRAST:  141mL OMNIPAQUE IOHEXOL 300 MG/ML  SOLN COMPARISON:  CT of the abdomen and pelvis performed 04/27/2010, and abdominal ultrasound performed 03/14/2010 FINDINGS: Mild bibasilar atelectasis or scarring is noted. Diffuse coronary artery calcifications are noted. The patient is status post median sternotomy. Scattered hypodensities within the liver are nonspecific but may reflect small cysts, measuring up to 1.5 cm in size. A few calcified granulomata are noted within the spleen.  The gallbladder is within normal limits. The pancreas and adrenal glands are unremarkable. Nonspecific perinephric stranding is noted bilaterally. Mild left renal scarring is noted. The kidneys are otherwise unremarkable. There is no evidence of hydronephrosis. No renal or ureteral stones are identified. No free fluid is identified. The small bowel is unremarkable in appearance. The stomach is within normal limits. No acute vascular abnormalities are seen. Scattered calcification is noted along the abdominal aorta and its branches. The patient is status post appendectomy. The colon is unremarkable in appearance. The bladder is mildly distended and grossly unremarkable. The prostate is enlarged, measuring 5.4 cm in transverse dimension. No inguinal lymphadenopathy is seen. There is a minimally displaced fracture extending through the anterior aspect of the L1 vertebral body, predominantly incomplete in nature. There is no evidence of extension to the posterior elements. Facet disease is noted along the lumbar spine. IMPRESSION: 1.  Minimally displaced fracture extending through the anterior aspect of the L1 vertebral body, predominantly incomplete in nature. No evidence of extension to the posterior elements. 2. Scattered calcification along the abdominal aorta and its branches. 3. Mild bibasilar atelectasis or scarring noted. 4. Diffuse coronary artery calcifications seen. 5. Scattered nonspecific hypodensities within the liver measure up to 1.5 cm in size and may reflect small cysts. 6. Mild left renal scarring noted. 7. Enlarged prostate seen. Electronically Signed   By: Garald Balding M.D.   On: 10/31/2015 22:13   I have personally reviewed and evaluated these images and lab results as part of my medical decision-making.   MDM   Final diagnoses:  Constipation, unspecified constipation type  L1 vertebral fracture, closed, initial encounter  Generalized abdominal pain    In short, 81 year old male who presents with constipation and generalized abdominal cramping. Vital signs are non-concerning and he is well appearing on arrival. Abdomen soft and with mild generalized pain to palpation. No evidence of fecal impaction. Unremarkable CBC, lipase, CMP, and UA. CT abdomen/pelvis performed, visualized, and reveals no acute intraabdominal processes. There is L1 vertebral body fracture, which patient is not tender around. He will follow-up with Neurosurgery in 1 week regarding this injury. Seems unlikely to be etiology of his symptoms today. Is given enema and started on bowel regimen. Discussed follow-up with PCP for repeat exam and evaluation in 1-2 days. Strict return and follow-up instructions reviewed. He expressed understanding of all discharge instructions and felt comfortable with the plan of care.     Forde Dandy, MD 11/01/15 [phone]

## 2015-11-01 ENCOUNTER — Encounter (HOSPITAL_COMMUNITY): Payer: Self-pay | Admitting: Emergency Medicine

## 2015-11-01 LAB — CBG MONITORING, ED: Glucose-Capillary: 86 mg/dL (ref 65–99)

## 2015-11-01 MED ORDER — POLYETHYLENE GLYCOL 3350 17 GM/SCOOP PO POWD: 17.0000 g | Freq: Once | ORAL | Status: DC

## 2015-11-01 MED ORDER — DOCUSATE SODIUM 100 MG PO CAPS: 100.0000 mg | ORAL_CAPSULE | Freq: Two times a day (BID) | ORAL | Status: DC

## 2015-11-01 NOTE — Discharge Instructions (Signed)
Constipation, Adult Constipation is when a person:  Poops (has a bowel movement) less than 3 times a week.  Has a hard time pooping.  Has poop that is dry, hard, or bigger than normal. HOME CARE   Eat foods with a lot of fiber in them. This includes fruits, vegetables, beans, and whole grains such as brown rice.  Avoid fatty foods and foods with a lot of sugar. This includes french fries, hamburgers, cookies, candy, and soda.  If you are not getting enough fiber from food, take products with added fiber in them (supplements).  Drink enough fluid to keep your pee (urine) clear or pale yellow.  Exercise on a regular basis, or as told by your doctor.  Go to the restroom when you feel like you need to poop. Do not hold it.  Only take medicine as told by your doctor. Do not take medicines that help you poop (laxatives) without talking to your doctor first. GET HELP RIGHT AWAY IF:   You have bright red blood in your poop (stool).  Your constipation lasts more than 4 days or gets worse.  You have belly (abdominal) or butt (rectal) pain.  You have thin poop (as thin as a pencil).  You lose weight, and it cannot be explained. MAKE SURE YOU:   Understand these instructions.  Will watch your condition.  Will get help right away if you are not doing well or get worse.   This information is not intended to replace advice given to you by your health care provider. Make sure you discuss any questions you have with your health care provider.   Document Released: 05/08/2008 Document Revised: 12/11/2014 Document Reviewed: 09/01/2013 Elsevier Interactive Patient Education 2016 [employer] Inc[employer]  

## 2015-11-03 ENCOUNTER — Encounter: Payer: Self-pay | Admitting: Family Medicine

## 2015-11-03 ENCOUNTER — Ambulatory Visit (INDEPENDENT_AMBULATORY_CARE_PROVIDER_SITE_OTHER): Payer: Medicare Other | Admitting: Family Medicine

## 2015-11-03 VITALS — BP 122/76 | HR 61 | Temp 98.1°F | Ht 70.0 in | Wt 198.0 lb

## 2015-11-03 DIAGNOSIS — K5900 Constipation, unspecified: Secondary | ICD-10-CM

## 2015-11-03 DIAGNOSIS — K589 Irritable bowel syndrome without diarrhea: Secondary | ICD-10-CM | POA: Insufficient documentation

## 2015-11-03 DIAGNOSIS — IMO0002 Reserved for concepts with insufficient information to code with codable children: Secondary | ICD-10-CM | POA: Insufficient documentation

## 2015-11-03 DIAGNOSIS — M8088XA Other osteoporosis with current pathological fracture, vertebra(e), initial encounter for fracture: Secondary | ICD-10-CM | POA: Diagnosis not present

## 2015-11-03 NOTE — Assessment & Plan Note (Signed)
Patient with a week and a half of constipation. Had a large bowel movement following an enema in the ED. CT in the ED did not reveal obstruction. Does not appear obstructed given that he is passing gas and has normal bowel sounds. He has benign abdominal exam. Discussed altering his medication regimen versus keeping it the same. Patient opted to continue with MiraLAX and Colace at this time. He will continue to monitor with this. If he develops any abdominal pain, increasing nausea, or vomiting he'll seek medical attention. He is given return precautions.

## 2015-11-03 NOTE — Patient Instructions (Signed)
Nice to meet you. Please continue the MiraLAX and Colace. He should monitor for bowel movements. If you develop nausea, vomiting, or abdominal pain, or fevers, or do not have a bowel movement in the next 1-2 days he should be reevaluated. Please monitor for pain in her back. This occurs please follow-up. If you develop nausea, vomiting, abdominal pain, fever, numbness, weakness, loss of bowel or bladder function, or numbness between her legs, or any new or changing symptoms please seek medical attention.

## 2015-11-03 NOTE — Progress Notes (Signed)
Patient ID: David Atkins, male   DOB: 12/17/1928, 81 y.o.   MRN: [id number]  Tommi Rumps, MD Phone: [phone]  David Atkins is a 81 y.o. male who presents today for same-day visit.  Constipation: Patient notes for about the last 10 days he has been constipated. He notes he had gone about 6 days without a bowel movement and then on Sunday went to the emergency room to be evaluated for this. He notes he was given an enema while there and had a large bowel movement in the ED. He had a CT scan that had no signs of obstruction or acute changes. He has noted mild abdominal cramping in his lower abdomen with this. He has been passing gas. He has had minimal if any nausea. He has not had any vomiting. He has been eating okay. States they placed him on MiraLAX nightly and Colace twice daily for this. He has not had a bowel movement since Monday.  Vertebral compression fracture, L1: Noted on CT scan from ED. Patient notes only trauma he had was falling about 3 feet into the pool at his house. He notes he landed on water. He did not contact the bottom of the pool or any rocks or hard surfaces. He had no discomfort following this. He has no discomfort at this time. He notes he is able to climb out with no issues. He has not had any pain in his back. He had no loss of consciousness, chest pain, or palpitations with this. No head injury with this. He has not had any numbness or weakness. Denies saddle anesthesia and loss of bowel or bladder continence. He is not having fevers. The patient does have a history of osteoporosis and has been on Fosamax in the past. He stopped this about 3 months ago on his own.  PMH: Former smoker.   ROS history of present illness  Objective  Physical Exam Filed Vitals:   11/03/15 1307  BP: 122/76  Pulse: 61  Temp: 98.1 F (36.7 C)   Physical Exam  Constitutional: He is well-developed, well-nourished, and in no distress.  HENT:  Head: Normocephalic and atraumatic.    Cardiovascular: Normal rate, regular rhythm and normal heart sounds.  Exam reveals no gallop and no friction rub.   No murmur heard. Pulmonary/Chest: Effort normal and breath sounds normal. No respiratory distress. He has no wheezes. He has no rales.  Abdominal: Soft. Bowel sounds are normal. He exhibits no distension. There is no tenderness. There is no rebound and no guarding.  Musculoskeletal: He exhibits no edema.  No midline spine tenderness on palpation or percussion, no midline spine step-off  Neurological: He is alert.  5 out of 5 strength in bilateral quads, hamstrings, plantar flexion, and dorsiflexion, sensation to light touch intact in bilateral lower extremities, 2+ patellar reflexes, normal gait  Skin: Skin is warm and dry. He is not diaphoretic.     Assessment/Plan: Please see individual problem list.  Constipation Patient with a week and a half of constipation. Had a large bowel movement following an enema in the ED. CT in the ED did not reveal obstruction. Does not appear obstructed given that he is passing gas and has normal bowel sounds. He has benign abdominal exam. Discussed altering his medication regimen versus keeping it the same. Patient opted to continue with MiraLAX and Colace at this time. He will continue to monitor with this. If he develops any abdominal pain, increasing nausea, or vomiting he'll seek medical attention. He is  given return precautions.  Compression fracture Visualized on CT abdomen pelvis. Patient has no pain or other symptoms at this time. He has a history of osteoporosis and was previously on Fosamax. He stopped this recently. Given compression fracture this would indicate that his osteoporosis is still present. He had benefit from remaining on Fosamax. Discussed this with him. He will restart this. Given return precautions.     Tommi Rumps

## 2015-11-03 NOTE — Assessment & Plan Note (Addendum)
Visualized on CT abdomen pelvis. Patient has no pain or other symptoms at this time. He has a history of osteoporosis and was previously on Fosamax. He stopped this recently. Given compression fracture this would indicate that his osteoporosis is still present. He would benefit from remaining on Fosamax. Discussed this with him. He will restart this. Given return precautions.

## 2015-11-03 NOTE — Progress Notes (Signed)
Pre visit review using our clinic review tool, if applicable. No additional management support is needed unless otherwise documented below in the visit note. 

## 2015-11-05 ENCOUNTER — Telehealth: Payer: Self-pay | Admitting: Internal Medicine

## 2015-11-05 NOTE — Telephone Encounter (Signed)
Please advise 

## 2015-11-05 NOTE — Telephone Encounter (Signed)
Spoke with patient and he will to titrate the Miralax. Patient stated that he is having a lot of discomfort so I told he that he could try a Walkin or the ER if it does not get any better due to [place] not having any appointments today.

## 2015-11-05 NOTE — Telephone Encounter (Signed)
I advise continue use of Miralax. He can titrate dosage in increments of 17 g until he has a BM.

## 2015-11-05 NOTE — Telephone Encounter (Signed)
Pt called about having really bad constipation for. Pt went to ED and they cleared it and now back constipated again. Pt states Dr Caryl Bis if his constipation did not clear up to call back today. Thank You!

## 2015-11-08 ENCOUNTER — Telehealth: Payer: Self-pay | Admitting: Internal Medicine

## 2015-11-08 NOTE — Telephone Encounter (Signed)
Unable to reach patient by phone tonight will try tomorrow.

## 2015-11-08 NOTE — Telephone Encounter (Signed)
Can you please put a referral in for GI.

## 2015-11-08 NOTE — Telephone Encounter (Addendum)
Called and spoke with the patient. He notes he had several bowel movements over the weekend. He took a dose of milk of magnesia and then started having loose bowel movements. He had them all day Saturday through Sunday. He felt well this morning with no abdominal cramping, then he notes he ate breakfast and developed lower cramping. He notes this is similar in nature to what was going on last week when he saw me. He is passing gas. His last bowel movement was a small one this morning. Has not had any blood in his stool. He has not felt nauseous or vomited. Discussed that given his persistent issues he should be seen in our office tomorrow. He prefers an afternoon time and I attempted to schedule for 15 appointment with Dr. Lacinda Axon, though the system will not let me schedule it. I will for this message to nursing so that they can call the patient to set up the appointment for reevaluation. He was given reasons to seek medical attention tonight.

## 2015-11-08 NOTE — Telephone Encounter (Signed)
Patient saw Dr. Caryl Bis on 11.30 for constipation and abdominal pain requesting referral to GI ? Please advise.

## 2015-11-08 NOTE — Telephone Encounter (Signed)
Pt called back about having stomach pain now. Pt is wondering if he can get a referral to the gastroenterologist? Thank You!

## 2015-11-09 ENCOUNTER — Encounter: Payer: Self-pay | Admitting: Family Medicine

## 2015-11-09 ENCOUNTER — Ambulatory Visit (INDEPENDENT_AMBULATORY_CARE_PROVIDER_SITE_OTHER): Payer: Medicare Other | Admitting: Family Medicine

## 2015-11-09 VITALS — BP 102/66 | HR 70 | Temp 98.4°F | Ht 70.0 in | Wt 196.2 lb

## 2015-11-09 DIAGNOSIS — K5900 Constipation, unspecified: Secondary | ICD-10-CM | POA: Diagnosis not present

## 2015-11-09 DIAGNOSIS — R1084 Generalized abdominal pain: Secondary | ICD-10-CM | POA: Diagnosis not present

## 2015-11-09 DIAGNOSIS — M545 Low back pain, unspecified: Secondary | ICD-10-CM

## 2015-11-09 MED ORDER — TRAMADOL HCL 50 MG PO TABS: 50.0000 mg | ORAL_TABLET | Freq: Three times a day (TID) | ORAL | Status: DC | PRN

## 2015-11-09 NOTE — Patient Instructions (Signed)
We will call with your appt.  Use the tramadol sparingly as it causes constipation.  Everything looks good from my perspective currently.  Take care  Dr. Lacinda Axon

## 2015-11-09 NOTE — Telephone Encounter (Signed)
Noted and thanks.

## 2015-11-09 NOTE — Telephone Encounter (Signed)
FYI , patient scheduled with Dr. Lacinda Axon at 4.15 today patient aware.

## 2015-11-09 NOTE — Progress Notes (Signed)
Pre visit review using our clinic review tool, if applicable. No additional management support is needed unless otherwise documented below in the visit note. 

## 2015-11-10 DIAGNOSIS — M549 Dorsalgia, unspecified: Secondary | ICD-10-CM | POA: Insufficient documentation

## 2015-11-10 DIAGNOSIS — R1084 Generalized abdominal pain: Secondary | ICD-10-CM | POA: Insufficient documentation

## 2015-11-10 NOTE — Assessment & Plan Note (Signed)
Established problem, worsening. Patient reporting additional episode of constipation. After taking MiraLAX he's now having regular bowel movements. He is however concerned about his associated abdominal pain is generalized the feelings that he's not completely digesting his food. A long discussion with patient as well as his son today about the etiology of his complaints. I informed them that his exam is normal and he appears well. I have reviewed the CT scan that was done recently as well as his labs. An underlying acute abdominal pathology is unlikely based on these findings. His symptoms appear to be from intermittent constipation. I tried to persuade him to continue conservative care but he and his family are deeply concerned especially given his age. They have adamantly requested a referral to GI which I placed today. Advised him to continue to use MiraLAX daily to ensure regular bowel movements.

## 2015-11-10 NOTE — Progress Notes (Signed)
Subjective:  Patient ID: David Atkins, male    DOB: 28-May-1929  Age: 81 y.o. MRN: [id number]  CC: Constipation, Abdominal pain  HPI:  81 year old male with a past medical history of hypertension, hyperlipidemia, CAD, compression fracture presents with the above complaints.  Constipation/abdominal pain  Patient was seen for this on 11/30 by my colleague Dr. Caryl Bis.  Patient states that he's been having intermittent constipation and associated abdominal pain since Thanksgiving.  He has been evaluated in the ED as well as in my office as above.  She's been doing okay until this past Saturday when he developed constipation yet again.  After using MiraLAX he did have a bowel movement and has had a bowel movement today as well.  He states that his stools are slightly loose and now firming up a little.  He reports his abdominal pain is generalized. He feels like he is not able to digest food well.  He reports associated back pain/flank pain.  Additionally, he reports generalized weakness.  He is very concerned about his intermittent constipation and associated abdominal discomfort. He states that he feels like he is unable to digest food well and would like to see a gastroenterologist.  Social Hx   Social History   Social History  . Marital Status: Married    Spouse Name: N/A  . Number of Children: N/A  . Years of Education: N/A   Social History Main Topics  . Smoking status: Former Smoker    Types: Cigarettes    Quit date: 07/10/1963  . Smokeless tobacco: Never Used  . Alcohol Use: No  . Drug Use: No  . Sexual Activity: Not Asked   Other Topics Concern  . None   Social History Narrative   Review of Systems  Constitutional: Positive for fatigue.  Gastrointestinal: Positive for abdominal pain, constipation and abdominal distention. Negative for nausea and vomiting.  Neurological: Positive for weakness.   Objective:  BP 102/66 mmHg  Pulse 70  Temp(Src) 98.4  F (36.9 C) (Oral)  Ht 5\' 10"  (1.778 m)  Wt 196 lb 4 oz (89.018 kg)  BMI 28.16 kg/m2  SpO2 95%  BP/Weight 11/09/2015 11/03/2015 [id number]  Systolic BP [id number]  Diastolic BP 66 76 65  Wt. (Lbs) 196.25 198 -  BMI 28.16 28.41 -   Physical Exam  Constitutional: He appears well-developed. No distress.  Eyes: No scleral icterus.  Cardiovascular: Normal rate and regular rhythm.   Pulmonary/Chest: Effort normal and breath sounds normal. No respiratory distress. He has no wheezes. He has no rales.  Abdominal: Soft.  Mild distension. Nontender to palpation. Hyperactive bowel sounds. No CVA tenderness.  Neurological: He is alert.  Psychiatric: Thought content normal.  Flat affect.  Vitals reviewed.  Lab Results  Component Value Date   WBC 6.5 10/31/2015   HGB 14.0 10/31/2015   HCT 41.7 10/31/2015   PLT 230 10/31/2015   GLUCOSE 86 10/31/2015   CHOL 119 11/25/2014   TRIG 114.0 11/25/2014   HDL 33.00* 11/25/2014   LDLCALC 63 11/25/2014   ALT 23 10/31/2015   AST 23 10/31/2015   NA 140 10/31/2015   K 3.6 10/31/2015   CL 109 10/31/2015   CREATININE 1.01 10/31/2015   BUN 11 10/31/2015   CO2 23 10/31/2015   TSH 1.27 11/25/2014   HGBA1C 5.8 04/17/2013    Assessment & Plan:   Problem List Items Addressed This Visit    Generalized abdominal pain - Primary    Abdominal pain appears to  be from constipation. I long discussion with the patient as well as his son today. There are insistent on referral which I placed today.      Relevant Orders   Ambulatory referral to Gastroenterology   Constipation    Established problem, worsening. Patient reporting additional episode of constipation. After taking MiraLAX he's now having regular bowel movements. He is however concerned about his associated abdominal pain is generalized the feelings that he's not completely digesting his food. A long discussion with patient as well as his son today about the etiology of his complaints. I  informed them that his exam is normal and he appears well. I have reviewed the CT scan that was done recently as well as his labs. An underlying acute abdominal pathology is unlikely based on these findings. His symptoms appear to be from intermittent constipation. I tried to persuade him to continue conservative care but he and his family are deeply concerned especially given his age. They have adamantly requested a referral to GI which I placed today. Advised him to continue to use MiraLAX daily to ensure regular bowel movements.      Relevant Orders   Ambulatory referral to Gastroenterology   Back pain    New problem. Patient having back pain which she attributes to his abdominal complaints. He has had a recent acute compression fracture as evident on CT. Patient would like something for pain regarding his back as well as his abdomen. I cautioned him repeatedly that use of opiates will make him constipated. He states that his bowel movements are improving and he wants something for the pain. Treating with tramadol (sparingly).      Relevant Medications   traMADol (ULTRAM) 50 MG tablet      Meds ordered this encounter  Medications  . traMADol (ULTRAM) 50 MG tablet    Sig: Take 1 tablet (50 mg total) by mouth every 8 (eight) hours as needed.    Dispense:  30 tablet    Refill:  0   30 minutes were spent face-to-face with the patient during this encounter and over half of that time was spent on counseling and coordination of care.   Patient and family are very anxious about his current complaints. I have reviewed the imaging, labs, and recent office visit. I discussed the findings with them in depth. We had a long discussion about the etiology of his complaints which is likely constipation.  Follow-up: Return if symptoms worsen or fail to improve.  [place]

## 2015-11-10 NOTE — Assessment & Plan Note (Signed)
New problem. Patient having back pain which she attributes to his abdominal complaints. He has had a recent acute compression fracture as evident on CT. Patient would like something for pain regarding his back as well as his abdomen. I cautioned him repeatedly that use of opiates will make him constipated. He states that his bowel movements are improving and he wants something for the pain. Treating with tramadol (sparingly).

## 2015-11-10 NOTE — Assessment & Plan Note (Signed)
Abdominal pain appears to be from constipation. I long discussion with the patient as well as his son today. There are insistent on referral which I placed today.

## 2015-11-11 DIAGNOSIS — K5900 Constipation, unspecified: Secondary | ICD-10-CM | POA: Diagnosis not present

## 2015-11-11 DIAGNOSIS — R1084 Generalized abdominal pain: Secondary | ICD-10-CM | POA: Diagnosis not present

## 2015-11-12 ENCOUNTER — Encounter: Payer: Self-pay | Admitting: *Deleted

## 2015-11-15 ENCOUNTER — Ambulatory Visit: Payer: Medicare Other | Admitting: Certified Registered Nurse Anesthetist

## 2015-11-15 ENCOUNTER — Encounter
Admission: RE | Disposition: A | Discharge status: Home / Self Care | Payer: Self-pay | Source: Ambulatory Visit | Attending: Gastroenterology

## 2015-11-15 ENCOUNTER — Telehealth: Payer: Self-pay | Admitting: Cardiovascular Disease

## 2015-11-15 ENCOUNTER — Ambulatory Visit
Admission: RE | Admit: 2015-11-15 | Discharge: 2015-11-15 | Disposition: A | Discharge status: Home / Self Care | Payer: Medicare Other | Source: Ambulatory Visit | Attending: Gastroenterology | Admitting: Gastroenterology

## 2015-11-15 ENCOUNTER — Telehealth: Payer: Self-pay

## 2015-11-15 ENCOUNTER — Encounter: Payer: Self-pay | Admitting: *Deleted

## 2015-11-15 DIAGNOSIS — K5900 Constipation, unspecified: Secondary | ICD-10-CM | POA: Diagnosis not present

## 2015-11-15 DIAGNOSIS — N401 Enlarged prostate with lower urinary tract symptoms: Secondary | ICD-10-CM | POA: Insufficient documentation

## 2015-11-15 DIAGNOSIS — G4730 Sleep apnea, unspecified: Secondary | ICD-10-CM | POA: Insufficient documentation

## 2015-11-15 DIAGNOSIS — I10 Essential (primary) hypertension: Secondary | ICD-10-CM | POA: Diagnosis not present

## 2015-11-15 DIAGNOSIS — Z79899 Other long term (current) drug therapy: Secondary | ICD-10-CM | POA: Insufficient documentation

## 2015-11-15 DIAGNOSIS — I739 Peripheral vascular disease, unspecified: Secondary | ICD-10-CM | POA: Insufficient documentation

## 2015-11-15 DIAGNOSIS — E785 Hyperlipidemia, unspecified: Secondary | ICD-10-CM | POA: Insufficient documentation

## 2015-11-15 DIAGNOSIS — N529 Male erectile dysfunction, unspecified: Secondary | ICD-10-CM | POA: Insufficient documentation

## 2015-11-15 DIAGNOSIS — Z951 Presence of aortocoronary bypass graft: Secondary | ICD-10-CM | POA: Diagnosis not present

## 2015-11-15 DIAGNOSIS — G709 Myoneural disorder, unspecified: Secondary | ICD-10-CM | POA: Diagnosis not present

## 2015-11-15 DIAGNOSIS — I2510 Atherosclerotic heart disease of native coronary artery without angina pectoris: Secondary | ICD-10-CM | POA: Diagnosis not present

## 2015-11-15 DIAGNOSIS — K219 Gastro-esophageal reflux disease without esophagitis: Secondary | ICD-10-CM | POA: Insufficient documentation

## 2015-11-15 DIAGNOSIS — R1032 Left lower quadrant pain: Secondary | ICD-10-CM | POA: Diagnosis not present

## 2015-11-15 DIAGNOSIS — Z87891 Personal history of nicotine dependence: Secondary | ICD-10-CM | POA: Insufficient documentation

## 2015-11-15 DIAGNOSIS — N320 Bladder-neck obstruction: Secondary | ICD-10-CM | POA: Insufficient documentation

## 2015-11-15 DIAGNOSIS — R1031 Right lower quadrant pain: Secondary | ICD-10-CM | POA: Insufficient documentation

## 2015-11-15 DIAGNOSIS — Z7982 Long term (current) use of aspirin: Secondary | ICD-10-CM | POA: Insufficient documentation

## 2015-11-15 DIAGNOSIS — I252 Old myocardial infarction: Secondary | ICD-10-CM | POA: Insufficient documentation

## 2015-11-15 DIAGNOSIS — M810 Age-related osteoporosis without current pathological fracture: Secondary | ICD-10-CM | POA: Insufficient documentation

## 2015-11-15 DIAGNOSIS — I440 Atrioventricular block, first degree: Secondary | ICD-10-CM | POA: Diagnosis not present

## 2015-11-15 DIAGNOSIS — R109 Unspecified abdominal pain: Secondary | ICD-10-CM | POA: Diagnosis not present

## 2015-11-15 HISTORY — DX: Hyperlipidemia, unspecified: E78.5

## 2015-11-15 HISTORY — DX: Gastro-esophageal reflux disease without esophagitis: K21.9

## 2015-11-15 HISTORY — PX: COLONOSCOPY WITH PROPOFOL: SHX5780

## 2015-11-15 HISTORY — DX: Sleep apnea, unspecified: G47.30

## 2015-11-15 SURGERY — COLONOSCOPY WITH PROPOFOL
Anesthesia: General

## 2015-11-15 MED ORDER — MIDAZOLAM HCL 2 MG/2ML IJ SOLN
INTRAMUSCULAR | Status: DC | PRN
  Administered 2015-11-15: 1 mg via INTRAVENOUS

## 2015-11-15 MED ORDER — LIDOCAINE HCL (CARDIAC) 20 MG/ML IV SOLN
INTRAVENOUS | Status: DC | PRN
  Administered 2015-11-15: 60 mg via INTRAVENOUS

## 2015-11-15 MED ORDER — SODIUM CHLORIDE 0.9 % IV SOLN
INTRAVENOUS | Status: DC
  Administered 2015-11-15: 1000 mL via INTRAVENOUS

## 2015-11-15 MED ORDER — PROPOFOL 500 MG/50ML IV EMUL
INTRAVENOUS | Status: DC | PRN
  Administered 2015-11-15: 120 ug/kg/min via INTRAVENOUS

## 2015-11-15 NOTE — Op Note (Signed)
[facility]astroenterology Patient Name: Ronald Peguero Procedure Date: 11/15/2015 11:46 AM MRN: [id number] Account #: [id number] Date of Birth: 03-09-1929 Admit Type: Outpatient Age: 81 Room: [facility] ENDO ROOM 4 Gender: Male Note Status: Finalized Procedure:         Colonoscopy Indications:       Abdominal pain in the left lower quadrant, Abdominal pain                     in the right lower quadrant, Constipation Providers:         Lupita Dawn. Candace Cruise, MD Referring MD:      Deborra Medina, MD (Referring MD) Medicines:         Monitored Anesthesia Care Complications:     No immediate complications. Procedure:         Pre-Anesthesia Assessment:                    - Prior to the procedure, a History and Physical was                     performed, and patient medications, allergies and                     sensitivities were reviewed. The patient's tolerance of                     previous anesthesia was reviewed.                    - The risks and benefits of the procedure and the sedation                     options and risks were discussed with the patient. All                     questions were answered and informed consent was obtained.                    - After reviewing the risks and benefits, the patient was                     deemed in satisfactory condition to undergo the procedure.                    After obtaining informed consent, the colonoscope was                     passed under direct vision. Throughout the procedure, the                     patient's blood pressure, pulse, and oxygen saturations                     were monitored continuously. The Colonoscope was                     introduced through the anus and advanced to the the cecum,                     identified by appendiceal orifice and ileocecal valve. The                     colonoscopy was performed without difficulty. The patient  tolerated the procedure well. The quality  of the bowel                     preparation was poor. Findings:      The colon (entire examined portion) appeared normal. Impression:        - Preparation of the colon was poor.                    - The entire examined colon is normal.                    - No specimens collected. Recommendation:    - Discharge patient to home.                    - High fiber diet daily. Procedure Code(s): --- Professional ---                    [phone], Colonoscopy, flexible; diagnostic, including                     collection of specimen(s) by brushing or washing, when                     performed (separate procedure) Diagnosis Code(s): --- Professional ---                    R10.32, Left lower quadrant pain                    R10.31, Right lower quadrant pain                    K59.00, Constipation, unspecified CPT copyright 2014 American Medical Association. All rights reserved. The codes documented in this report are preliminary and upon coder review may  be revised to meet current compliance requirements. Hulen Luster, MD 11/15/2015 12:05:25 PM This report has been signed electronically. Number of Addenda: 0 Note Initiated On: 11/15/2015 11:46 AM Scope Withdrawal Time: 0 hours 6 minutes 28 seconds  Total Procedure Duration: 0 hours 11 minutes 7 seconds       [facility]

## 2015-11-15 NOTE — Anesthesia Procedure Notes (Signed)
Date/Time: 11/15/2015 11:50 AM Performed by: Johnna Acosta Pre-anesthesia Checklist: Patient identified, Emergency Drugs available, Suction available, Patient being monitored and Timeout performed Patient Re-evaluated:Patient Re-evaluated prior to inductionOxygen Delivery Method: Nasal cannula

## 2015-11-15 NOTE — Telephone Encounter (Signed)
Contacted by Dr. Verdie Shire that the patient, [name] needs a GI procedure, colonoscopy, possibly EGD.  By report, patient is had no new symptoms of shortness of breath concerning for angina. I've known him for any years, he has been stable since his bypass surgery 10 years ago. No stenting, no anginal symptoms during this time. In clinic, blood pressure has been relatively stable.   Given no new symptoms concerning for angina and GI procedure is a low risk procedure, he would be acceptable risk for his egd/colo today with Dr. Candace Cruise.  No further testing needed If patient does develop any symptoms, would recommend EKG, and please contact our office. We would be available for consultation if needed.

## 2015-11-15 NOTE — Anesthesia Preprocedure Evaluation (Signed)
Anesthesia Evaluation  Patient identified by MRN, date of birth, ID band Patient awake    Reviewed: Allergy & Precautions, H&P , NPO status , Patient's Chart, lab work & pertinent test results  History of Anesthesia Complications Negative for: history of anesthetic complications  Airway Mallampati: III  TM Distance: >3 FB Neck ROM: limited    Dental  (+) Poor Dentition, Missing, Upper Dentures, Lower Dentures   Pulmonary neg shortness of breath, sleep apnea , former smoker,    Pulmonary exam normal breath sounds clear to auscultation       Cardiovascular Exercise Tolerance: Poor hypertension, (-) angina+ CAD, + Past MI, + Peripheral Vascular Disease and + DOE  Normal cardiovascular exam Rhythm:regular Rate:Normal     Neuro/Psych PSYCHIATRIC DISORDERS  Neuromuscular disease negative neurological ROS     GI/Hepatic Neg liver ROS, GERD  Controlled,  Endo/Other  negative endocrine ROS  Renal/GU negative Renal ROS  negative genitourinary   Musculoskeletal   Abdominal   Peds  Hematology negative hematology ROS (+)   Anesthesia Other Findings Past Medical History:   Osteoporosis                                    2010           Comment:by DEXA at [facility]   BPH (benign prostatic hyperplasia)                           Hypertension                                                 Myocardial infarction ([place])                     2006         basal cell                                                   3-vessel coronary artery disease                2006           Comment:[place]   Lumbar stenosis                                 12/03/13       Comment:MRI - lumbar pain with radiation down to LE   Spondylolisthesis of lumbar region              11/23/13       Comment:MRI Lumbar spine   OAB (overactive bladder)                                     Hyperlipidemia  Sleep apnea                                                   GERD (gastroesophageal reflux disease)                      Past Surgical History:   APPENDECTOMY                                     1951         NASAL SEPTOPLASTY W/ TURBINOPLASTY               1981         CORONARY ARTERY BYPASS GRAFT                     2006           Comment:3 vessel, s/p AMI   COLONOSCOPY                                                   TONSILLECTOMY                                                Patient received cardiac clearance for this procedure today.   Reproductive/Obstetrics negative OB ROS                             Anesthesia Physical Anesthesia Plan  ASA: III  Anesthesia Plan: General   Post-op Pain Management:    Induction:   Airway Management Planned:   Additional Equipment:   Intra-op Plan:   Post-operative Plan:   Informed Consent: I have reviewed the patients History and Physical, chart, labs and discussed the procedure including the risks, benefits and alternatives for the proposed anesthesia with the patient or authorized representative who has indicated his/her understanding and acceptance.   Dental Advisory Given  Plan Discussed with: Anesthesiologist, CRNA and Surgeon  Anesthesia Plan Comments:         Anesthesia Quick Evaluation

## 2015-11-15 NOTE — Anesthesia Postprocedure Evaluation (Signed)
Anesthesia Post Note  Patient: David Atkins  Procedure(s) Performed: Procedure(s) (LRB): COLONOSCOPY WITH PROPOFOL (N/A)  Patient location during evaluation: Endoscopy Anesthesia Type: General Level of consciousness: awake and alert Pain management: pain level controlled Vital Signs Assessment: post-procedure vital signs reviewed and stable Respiratory status: spontaneous breathing, nonlabored ventilation, respiratory function stable and patient connected to nasal cannula oxygen Cardiovascular status: blood pressure returned to baseline and stable Postop Assessment: no signs of nausea or vomiting Anesthetic complications: no    Last Vitals:  Filed Vitals:   11/15/15 1240 11/15/15 1250  BP: 107/71 128/74  Pulse: 65 69  Temp:    Resp: 11 12    Last Pain: There were no vitals filed for this visit.               Precious Haws Piscitello

## 2015-11-15 NOTE — H&P (Signed)
  Date of Initial H&P: 11/11/2015  History reviewed, patient examined, no change in status, stable for surgery. 

## 2015-11-15 NOTE — Telephone Encounter (Signed)
Anesthesia needs clearance in writing for this pt.

## 2015-11-15 NOTE — Transfer of Care (Signed)
Immediate Anesthesia Transfer of Care Note  Patient: David Atkins  Procedure(s) Performed: Procedure(s): COLONOSCOPY WITH PROPOFOL (N/A)  Patient Location: PACU  Anesthesia Type:General  Level of Consciousness: sedated  Airway & Oxygen Therapy: Patient Spontanous Breathing and Patient connected to nasal cannula oxygen  Post-op Assessment: Report given to RN and Post -op Vital signs reviewed and stable  Post vital signs: Reviewed and stable  Last Vitals:  Filed Vitals:   11/15/15 0957 11/15/15 1220  BP: 151/80 85/60  Pulse: 74 64  Temp: 36.9 C 36 C  Resp: 18 13    Complications: No apparent anesthesia complications

## 2015-11-17 ENCOUNTER — Encounter: Payer: Self-pay | Admitting: Gastroenterology

## 2015-11-18 ENCOUNTER — Encounter: Payer: Self-pay | Admitting: Cardiovascular Disease

## 2015-11-18 ENCOUNTER — Ambulatory Visit (INDEPENDENT_AMBULATORY_CARE_PROVIDER_SITE_OTHER): Payer: Medicare Other | Admitting: Cardiovascular Disease

## 2015-11-18 VITALS — BP 122/62 | HR 70 | Ht 70.0 in | Wt 190.5 lb

## 2015-11-18 DIAGNOSIS — I2510 Atherosclerotic heart disease of native coronary artery without angina pectoris: Secondary | ICD-10-CM

## 2015-11-18 DIAGNOSIS — I6523 Occlusion and stenosis of bilateral carotid arteries: Secondary | ICD-10-CM | POA: Diagnosis not present

## 2015-11-18 DIAGNOSIS — I10 Essential (primary) hypertension: Secondary | ICD-10-CM | POA: Diagnosis not present

## 2015-11-18 DIAGNOSIS — E785 Hyperlipidemia, unspecified: Secondary | ICD-10-CM

## 2015-11-18 NOTE — Assessment & Plan Note (Signed)
Currently with no symptoms of angina. No further workup at this time. Continue current medication regimen. 

## 2015-11-18 NOTE — Progress Notes (Signed)
Patient ID: David Atkins, male    DOB: Aug 12, 1929, 81 y.o.   MRN: [id number]  HPI Comments: [name] is a very pleasant 81 year old gentleman with a history of coronary artery disease, bypass surgery in 2006 at [facility] in [place], mild carotid arterial disease by report, history of hyperlipidemia, small smoking history in the remote past, who presents for routine followup of his coronary artery disease . He is a patient of Dr. Derrel Nip.  In follow-up today, he recently underwent colonoscopy for constipation type symptoms He is taking Miramax twice a day He reports colonoscopy was relatively unrevealing. Managed by Dr. Verdie Shire. Son is concerned as anesthesia was very worried about his history  Patient denies any symptoms concerning for angina No changes in his symptoms over the past year, still works in his workshop No shortness of breath, no leg edema. Tolerating his medications well Apart from crampy abdominal pain, he feels well  EKG on today's visit shows normal sinus rhythm with rate 70 bpm, no significant ST or T-wave changes  Other past medical history  01/06/2014, he reported that he had general malaise, blood pressure checked showed systolic pressure greater than 200. He went to the emergency room for further evaluation. Workup there was negative including normal basic metabolic panel, negative cardiac enzymes. CT scan showed chronic microvascular changes in the deep white matter, and he was discharged home after medical management of his blood pressure. On a previous visit, his Son was concerned about various arguments he may be having with other family members at home and the stress this may be causing him.   Laboratory shows total cholesterol 129, LDL 71    Allergies  Allergen Reactions  . Sulfacetamide Sodium   . Sulfa Antibiotics Rash    Outpatient Encounter Prescriptions as of 11/18/2015  Medication Sig  . acetaminophen (TYLENOL) 325 MG tablet  Take 650 mg by mouth 2 (two) times daily as needed.  Marland Kitchen alendronate (FOSAMAX) 70 MG tablet TAKE 1 TABLET BY MOUTH WEEKLY WITH A FULL GLASS OF WATER ON AN EMPTY STOMACH  . Blood Pressure Monitoring (BLOOD PRESSURE MONITOR AUTOMAT) DEVI Use daily to check blood pressure  . Calcium Carbonate-Vitamin D (CALCIUM + D PO) Take 1 tablet by mouth daily.   . Cholecalciferol (VITAMIN D-3) 1000 UNITS CAPS Take 1 capsule by mouth daily.  Marland Kitchen docusate sodium (COLACE) 100 MG capsule Take 1 capsule (100 mg total) by mouth 2 (two) times daily.  . hyoscyamine (LEVSIN, ANASPAZ) 0.125 MG tablet Take 0.125 mg by mouth every 4 (four) hours as needed for cramping.  Marland Kitchen lisinopril (PRINIVIL,ZESTRIL) 20 MG tablet TAKE 1 TABLET (20 MG TOTAL) BY MOUTH DAILY.  . meclizine (ANTIVERT) 12.5 MG tablet TAKE 1 TABLET (12.5 MG TOTAL) BY MOUTH 3 (THREE) TIMES DAILY AS NEEDED.  . metoprolol tartrate (LOPRESSOR) 25 MG tablet TAKE 1/2 TABLET (25 MG TOTAL) BY MOUTH 2 (TWO) TIMES DAILY.  Marland Kitchen omeprazole (PRILOSEC) 20 MG capsule TAKE 1 CAPSULE (20 MG TOTAL) BY MOUTH DAILY.  Marland Kitchen polyethylene glycol powder (GLYCOLAX/MIRALAX) powder Take 17 g by mouth once. Dissolve 1 capful of power into any liquid and drink once daily (Patient taking differently: Take 17 g by mouth 2 (two) times daily. Dissolve 1 capful of power into any liquid and drink once daily)  . Saw Palmetto, Serenoa repens, (SAW PALMETTO PO) Take 1 tablet by mouth 2 (two) times daily.   . simvastatin (ZOCOR) 20 MG tablet TAKE 1 TABLET BY MOUTH AT BEDTIME  .  tamsulosin (FLOMAX) 0.4 MG CAPS capsule TAKE 1 CAPSULE BY MOUTH DAILY AFTER BREAKFAST.  Marland Kitchen tolterodine (DETROL LA) 4 MG 24 hr capsule Take 4 mg by mouth daily.  . traMADol (ULTRAM) 50 MG tablet Take 1 tablet (50 mg total) by mouth every 8 (eight) hours as needed.  Marland Kitchen aspirin 81 MG tablet Take 81 mg by mouth daily. Reported on 11/18/2015  . [DISCONTINUED] diazepam (VALIUM) 5 MG tablet Take 1 tablet (5 mg total) by mouth daily as needed for  anxiety. (Patient not taking: Reported on 11/15/2015)  . [DISCONTINUED] nitroGLYCERIN (NITROSTAT) 0.4 MG SL tablet Place 1 tablet (0.4 mg total) under the tongue every 5 (five) minutes as needed for chest pain. (Patient not taking: Reported on 11/15/2015)   No facility-administered encounter medications on file as of 11/18/2015.    Past Medical History  Diagnosis Date  . Osteoporosis 2010    by DEXA at [facility]  . BPH (benign prostatic hyperplasia)   . Hypertension   . Myocardial infarction ([place]) 2006  . basal cell   . 3-vessel coronary artery disease 2006    [place]  . Lumbar stenosis 12/03/13    MRI - lumbar pain with radiation down to LE  . Spondylolisthesis of lumbar region 11/23/13    MRI Lumbar spine  . OAB (overactive bladder)   . Hyperlipidemia   . Sleep apnea   . GERD (gastroesophageal reflux disease)     Past Surgical History  Procedure Laterality Date  . Appendectomy  1951  . Nasal septoplasty w/ turbinoplasty  1981  . Coronary artery bypass graft  2006    3 vessel, s/p AMI  . Colonoscopy    . Tonsillectomy    . Colonoscopy with propofol N/A 11/15/2015    Procedure: COLONOSCOPY WITH PROPOFOL;  Surgeon: Hulen Luster, MD;  Location: [facility] ENDOSCOPY;  Service: Gastroenterology;  Laterality: N/A;    Social History  reports that he quit smoking about 52 years ago. His smoking use included Cigarettes. He has never used smokeless tobacco. He reports that he does not drink alcohol or use illicit drugs.  Family History family history includes Hyperlipidemia in his mother; Hypertension in his father and mother.   Review of Systems  Constitutional: Negative.   Respiratory: Negative.   Gastrointestinal: Negative.   Musculoskeletal: Positive for gait problem.  Skin: Negative.   Neurological: Negative.   Hematological: Negative.   Psychiatric/Behavioral: Negative.   All other systems reviewed and are negative.   BP 122/62 mmHg  Pulse 70  Ht 5\' 10"  (1.778 m)  Wt 190 lb 8  oz (86.41 kg)  BMI 27.33 kg/m2  Physical Exam  Constitutional: He is oriented to person, place, and time. He appears well-developed and well-nourished.  HENT:  Head: Normocephalic.  Nose: Nose normal.  Mouth/Throat: Oropharynx is clear and moist.  Eyes: Conjunctivae are normal. Pupils are equal, round, and reactive to light.  Neck: Normal range of motion. Neck supple. No JVD present.  Cardiovascular: Normal rate, regular rhythm, S1 normal, S2 normal, normal heart sounds and intact distal pulses.  Exam reveals no gallop and no friction rub.   No murmur heard. Trace minimal pitting edema bilateral ankle area  Pulmonary/Chest: Effort normal and breath sounds normal. No respiratory distress. He has no wheezes. He has no rales. He exhibits no tenderness.  Abdominal: Soft. Bowel sounds are normal. He exhibits no distension. There is no tenderness.  Musculoskeletal: Normal range of motion. He exhibits no edema or tenderness.  Lymphadenopathy:  He has no cervical adenopathy.  Neurological: He is alert and oriented to person, place, and time. Coordination normal.  Skin: Skin is warm and dry. No rash noted. No erythema.  Psychiatric: He has a normal mood and affect. His behavior is normal. Judgment and thought content normal.      Assessment and Plan   Nursing note and vitals reviewed.

## 2015-11-18 NOTE — Assessment & Plan Note (Signed)
Cholesterol is at goal on the current lipid regimen. No changes to the medications were made.  

## 2015-11-18 NOTE — Patient Instructions (Signed)
You are doing well. No medication changes were made.  Please call us if you have new issues that need to be addressed before your next appt.  Your physician wants you to follow-up in: 6 months.  You will receive a reminder letter in the mail two months in advance. If you don't receive a letter, please call our office to schedule the follow-up appointment.   

## 2015-11-18 NOTE — Assessment & Plan Note (Signed)
Blood pressure is well controlled on today's visit. No changes made to the medications. 

## 2015-11-18 NOTE — Assessment & Plan Note (Signed)
Mild bilateral carotid arterial disease March 2016 by ultrasound, consider rechecking several years time

## 2015-11-19 DIAGNOSIS — K5900 Constipation, unspecified: Secondary | ICD-10-CM | POA: Diagnosis not present

## 2015-11-19 DIAGNOSIS — R1031 Right lower quadrant pain: Secondary | ICD-10-CM | POA: Diagnosis not present

## 2015-11-19 DIAGNOSIS — R1032 Left lower quadrant pain: Secondary | ICD-10-CM | POA: Diagnosis not present

## 2015-11-19 LAB — HM COLONOSCOPY: HM Colonoscopy: NORMAL

## 2015-11-20 ENCOUNTER — Other Ambulatory Visit: Payer: Self-pay | Admitting: Internal Medicine

## 2015-12-03 ENCOUNTER — Ambulatory Visit (INDEPENDENT_AMBULATORY_CARE_PROVIDER_SITE_OTHER): Payer: Medicare Other | Admitting: Internal Medicine

## 2015-12-03 ENCOUNTER — Encounter: Payer: Self-pay | Admitting: Internal Medicine

## 2015-12-03 VITALS — BP 130/72 | HR 77 | Temp 98.5°F | Resp 14 | Ht 70.0 in | Wt 192.0 lb

## 2015-12-03 DIAGNOSIS — R2689 Other abnormalities of gait and mobility: Secondary | ICD-10-CM | POA: Diagnosis not present

## 2015-12-03 DIAGNOSIS — Z9989 Dependence on other enabling machines and devices: Secondary | ICD-10-CM

## 2015-12-03 DIAGNOSIS — R269 Unspecified abnormalities of gait and mobility: Secondary | ICD-10-CM

## 2015-12-03 DIAGNOSIS — H8113 Benign paroxysmal vertigo, bilateral: Secondary | ICD-10-CM

## 2015-12-03 DIAGNOSIS — M8088XD Other osteoporosis with current pathological fracture, vertebra(e), subsequent encounter for fracture with routine healing: Secondary | ICD-10-CM | POA: Diagnosis not present

## 2015-12-03 DIAGNOSIS — G4733 Obstructive sleep apnea (adult) (pediatric): Secondary | ICD-10-CM

## 2015-12-03 DIAGNOSIS — F411 Generalized anxiety disorder: Secondary | ICD-10-CM

## 2015-12-03 DIAGNOSIS — I10 Essential (primary) hypertension: Secondary | ICD-10-CM

## 2015-12-03 DIAGNOSIS — R142 Eructation: Secondary | ICD-10-CM

## 2015-12-03 DIAGNOSIS — R143 Flatulence: Secondary | ICD-10-CM

## 2015-12-03 DIAGNOSIS — I951 Orthostatic hypotension: Secondary | ICD-10-CM

## 2015-12-03 DIAGNOSIS — R29818 Other symptoms and signs involving the nervous system: Secondary | ICD-10-CM

## 2015-12-03 DIAGNOSIS — IMO0002 Reserved for concepts with insufficient information to code with codable children: Secondary | ICD-10-CM

## 2015-12-03 DIAGNOSIS — R141 Gas pain: Secondary | ICD-10-CM

## 2015-12-03 MED ORDER — TETANUS-DIPHTH-ACELL PERTUSSIS 5-2.5-18.5 LF-MCG/0.5 IM SUSP: 0.5000 mL | Freq: Once | INTRAMUSCULAR | Status: DC

## 2015-12-03 NOTE — Progress Notes (Signed)
Subjective:  Patient ID: David Atkins, male    DOB: 1929/06/14  Age: 81 y.o. MRN: [id number]  CC: The primary encounter diagnosis was Abnormality of gait. Diagnoses of Unsteady when turning, Compression fracture, Flatulence, eructation and gas pain, Essential hypertension, Generalized anxiety disorder, OSA on CPAP, Vertigo, benign paroxysmal, bilateral, Orthostasis, and Balance problem were also pertinent to this visit.  HPI David Atkins presents for follow up on multiple issues.  A total of 45 minutes was spent with patient today,  Over 50% of which was reviewing recent workups done by his specialists and providing counselling on the the following issues:  1) persistent recurrent  abd pain which occurs daily around 5 pm  Patient was initially seen 12/6 by  Sandria Manly,  Also by ED with CT abdomen negative for masses and diveticulitis. Was then seen by Thersa Salt.  Constipation diagnosis repeated and discussed but patient and son requested GI consult,  Was evaluated  with colonoscopy 12/12 which was normal.  He was treated for constipation secondary to IBS  with linzess along with mirlrax prn,  An d  levsin prn . the abd pain has not resolved  But has improved.  Accepts the diagnosis of IBS and has modified diet.   No longer taking the linzess,  Dr Candace Cruise discotinued it because  it was causing severe diarrhea.  bowels are moving now daily on miralax  For the past week,  Still soft  But not liquid.  Still having abdominal pain that occurs every day in a progressive fashion and is significant by 5 pm accompanied by flatulence, and gurgling .  Taking Gas X several times daily .   Cramping quality , nearly resolves with use of levsin.   2) Symptomatic incomplete L1 compression fracture on 11/28 CT abdomen  Occurred after a fall  which occurred before Thanksgiving while working on an embankment above his pool,  Landed in the water no pain at the time. Had stopped fosamax 3 months prior to incident, after  taking it for  2 or 4 years. Was advised by Caryl Bis to resume fosamax,  But he is undecided.  Discussed risks and benefits of therapy  3)Dizziness and fatigue. Wears CPAP .  Last sleep study was over 5 years,ago .     4) Hypertension:  Has been having systolics in the low [id number] , has had no BP meds in 5 days and readings are all < 140/80.   5) Getting very unsteady on feet  .  Worse when he gets up suddenly.    Outpatient Prescriptions Prior to Visit  Medication Sig Dispense Refill  . acetaminophen (TYLENOL) 325 MG tablet Take 650 mg by mouth 2 (two) times daily as needed.    Marland Kitchen alendronate (FOSAMAX) 70 MG tablet TAKE 1 TABLET BY MOUTH WEEKLY WITH A FULL GLASS OF WATER ON AN EMPTY STOMACH 4 tablet 9  . aspirin 81 MG tablet Take 81 mg by mouth daily. Reported on 11/18/2015    . Blood Pressure Monitoring (BLOOD PRESSURE MONITOR AUTOMAT) DEVI Use daily to check blood pressure 1 Device 0  . Calcium Carbonate-Vitamin D (CALCIUM + D PO) Take 1 tablet by mouth daily.     . Cholecalciferol (VITAMIN D-3) 1000 UNITS CAPS Take 1 capsule by mouth daily.    Marland Kitchen docusate sodium (COLACE) 100 MG capsule Take 1 capsule (100 mg total) by mouth 2 (two) times daily. 60 capsule 0  . hyoscyamine (LEVSIN, ANASPAZ) 0.125 MG tablet Take 0.125 mg by  mouth every 4 (four) hours as needed for cramping.    Marland Kitchen lisinopril (PRINIVIL,ZESTRIL) 20 MG tablet TAKE 1 TABLET (20 MG TOTAL) BY MOUTH DAILY. 90 tablet 1  . meclizine (ANTIVERT) 12.5 MG tablet TAKE 1 TABLET (12.5 MG TOTAL) BY MOUTH 3 (THREE) TIMES DAILY AS NEEDED. 30 tablet 1  . metoprolol tartrate (LOPRESSOR) 25 MG tablet TAKE 1/2 TABLET (25 MG TOTAL) BY MOUTH 2 (TWO) TIMES DAILY. 180 tablet 1  . omeprazole (PRILOSEC) 20 MG capsule TAKE 1 CAPSULE (20 MG TOTAL) BY MOUTH DAILY. 90 capsule 1  . polyethylene glycol powder (GLYCOLAX/MIRALAX) powder Take 17 g by mouth once. Dissolve 1 capful of power into any liquid and drink once daily (Patient taking differently: Take 17 g by  mouth 2 (two) times daily. Dissolve 1 capful of power into any liquid and drink once daily) 500 g 0  . Saw Palmetto, Serenoa repens, (SAW PALMETTO PO) Take 1 tablet by mouth 2 (two) times daily.     . simvastatin (ZOCOR) 20 MG tablet TAKE 1 TABLET BY MOUTH AT BEDTIME 90 tablet 1  . tamsulosin (FLOMAX) 0.4 MG CAPS capsule TAKE 1 CAPSULE BY MOUTH DAILY AFTER BREAKFAST. 30 capsule 1  . tolterodine (DETROL LA) 4 MG 24 hr capsule Take 4 mg by mouth daily.    . metoprolol tartrate (LOPRESSOR) 25 MG tablet TAKE 1/2 TABLET (25 MG TOTAL) BY MOUTH 2 (TWO) TIMES DAILY. 60 tablet 0  . traMADol (ULTRAM) 50 MG tablet Take 1 tablet (50 mg total) by mouth every 8 (eight) hours as needed. 30 tablet 0   No facility-administered medications prior to visit.    Review of Systems;  Patient denies headache, fevers, malaise, unintentional weight loss, skin rash, eye pain, sinus congestion and sinus pain, sore throat, dysphagia,  hemoptysis , cough, dyspnea, wheezing, chest pain, palpitations, orthopnea, edema, abdominal pain, nausea, melena, diarrhea, constipation, flank pain, dysuria, hematuria, urinary  Frequency, nocturia, numbness, tingling, seizures,  Focal weakness, Loss of consciousness,  Tremor, insomnia, depression, anxiety, and suicidal ideation.      Objective:  BP 130/72 mmHg  Pulse 77  Temp(Src) 98.5 F (36.9 C) (Oral)  Resp 14  Ht 5\' 10"  (1.778 m)  Wt 192 lb (87.091 kg)  BMI 27.55 kg/m2  SpO2 97%  BP Readings from Last 3 Encounters:  12/03/15 130/72  11/18/15 122/62  11/15/15 128/74    Wt Readings from Last 3 Encounters:  12/03/15 192 lb (87.091 kg)  11/18/15 190 lb 8 oz (86.41 kg)  11/09/15 196 lb 4 oz (89.018 kg)    General appearance: alert, cooperative and appears stated age Ears: normal TM's and external ear canals both ears Throat: lips, mucosa, and tongue normal; teeth and gums normal Neck: no adenopathy, no carotid bruit, supple, symmetrical, trachea midline and thyroid not  enlarged, symmetric, no tenderness/mass/nodules Back: symmetric, no curvature. ROM normal. No CVA tenderness. Lungs: clear to auscultation bilaterally Heart: regular rate and rhythm, S1, S2 normal, no murmur, click, rub or gallop Abdomen: soft, non-tender; bowel sounds normal; no masses,  no organomegaly Pulses: 2+ and symmetric Skin: Skin color, texture, turgor normal. No rashes or lesions Lymph nodes: Cervical, supraclavicular, and axillary nodes normal. Neuro: CNs 2-12 intact. DTRs 2+/4 in biceps, brachioradialis, patellars and achilles. Muscle strength 5/5 in upper and lower exremities. Intention tremor bilaterally both hands,  cerebellar function normal. Romberg negative.  No pronator drift.   Gait normal.   Lab Results  Component Value Date   HGBA1C 5.8 04/17/2013  Lab Results  Component Value Date   CREATININE 1.01 10/31/2015   CREATININE 1.0 11/25/2014   CREATININE 0.9 05/22/2014    Lab Results  Component Value Date   WBC 6.5 10/31/2015   HGB 14.0 10/31/2015   HCT 41.7 10/31/2015   PLT 230 10/31/2015   GLUCOSE 86 10/31/2015   CHOL 119 11/25/2014   TRIG 114.0 11/25/2014   HDL 33.00* 11/25/2014   LDLCALC 63 11/25/2014   ALT 23 10/31/2015   AST 23 10/31/2015   NA 140 10/31/2015   K 3.6 10/31/2015   CL 109 10/31/2015   CREATININE 1.01 10/31/2015   BUN 11 10/31/2015   CO2 23 10/31/2015   TSH 1.27 11/25/2014   HGBA1C 5.8 04/17/2013    No results found.  Assessment & Plan:   Problem List Items Addressed This Visit    Hypertension    Currently resolved,  Prior elevations may have been due to emotional stress which has resolved.  meds suspended.       Vertigo, benign paroxysmal    Resolved,  Still using meclizine daily,  Advised to stop as it will not help the light headed feelingn he his having currently      OSA on CPAP    Diagnosed by prior sleep study. Patient is using CPAP every night a minimum of 6 hours per night but has not had a study in over 5  years.  Advised to have one but he has deferred for now.       Flatulence, eructation and gas pain    Secondary to IBS>  Advised to try using Beano with meals, FODMAP diet  and continue simethicone.       Generalized anxiety disorder    Offered trial of lexapro,  But he has deferred for now.         Compression fracture    Patient has known osteoporosis by DEXA and has completed < 5 years of alendronate, whch was stopped recently during workup for abd pian.  Advised to resume medication      Orthostasis    Symptomatic with multiple medications likely contributing along with effects of agin. bp meds suspended.       Balance problem    Due to core strength weakness due to DJD hip,  Other multiple etiologies likely at play. Referral to PT for work on balance.        Other Visit Diagnoses    Abnormality of gait    -  Primary    Unsteady when turning        Relevant Orders    Ambulatory referral to Physical Therapy      A total of 40 minutes was spent with patient more than half of which was spent in counseling patient on the above mentioned issues , reviewing and explaining recent labs and imaging studies done, and coordination of care.  I have discontinued Mr. Buxbaum's traMADol. I am also having him start on Tdap. Additionally, I am having him maintain his tolterodine, acetaminophen, aspirin, Calcium Carbonate-Vitamin D (CALCIUM + D PO), (Saw Palmetto, Serenoa repens, (SAW PALMETTO PO)), Blood Pressure Monitor Automat, Vitamin D-3, simvastatin, tamsulosin, alendronate, metoprolol tartrate, omeprazole, meclizine, lisinopril, docusate sodium, polyethylene glycol powder, and hyoscyamine.  Meds ordered this encounter  Medications  . Tdap (BOOSTRIX) 5-2.5-18.5 LF-MCG/0.5 injection    Sig: Inject 0.5 mLs into the muscle once.    Dispense:  0.5 mL    Refill:  0    Medications Discontinued During This Encounter  Medication Reason  . metoprolol tartrate (LOPRESSOR) 25 MG tablet  Completed Course  . traMADol (ULTRAM) 50 MG tablet Completed Course    Follow-up: No Follow-up on file.   [name], MD

## 2015-12-03 NOTE — Patient Instructions (Addendum)
I agree that you should stop the lisinopril and metoprolol  Continue to check BP once daily  Along with  the pulse  with the new machie   Call if readings are staying above 140/80 .  We discussed starting a medication called Lexapro to help your IB S symptoms,  .  If your diet does not improve your symptoms in a few weeks,  Call me and we can start this medication    I agree with resuming alendronate for your osteoporosis ,  If you tolerate it.   Please  INCREASE YOUR VITAMIN D TO 2 TABLETS DAILY   RETURN IN [place] FOR FASTING LABS   You should have a repeat sleep study soon.   Physical Therapy to help work on balance ,  In process  Some of you medications will cause dizziness ( Flomax, and Detrol may both cause dizziness ) we could try switching to Myrbetriq instead of Detrol.

## 2015-12-03 NOTE — Progress Notes (Signed)
Pre visit review using our clinic review tool, if applicable. No additional management support is needed unless otherwise documented below in the visit note. 

## 2015-12-04 DIAGNOSIS — R2689 Other abnormalities of gait and mobility: Secondary | ICD-10-CM | POA: Insufficient documentation

## 2015-12-04 DIAGNOSIS — I951 Orthostatic hypotension: Secondary | ICD-10-CM | POA: Insufficient documentation

## 2015-12-04 NOTE — Assessment & Plan Note (Signed)
Currently resolved,  Prior elevations may have been due to emotional stress which has resolved.  meds suspended.

## 2015-12-04 NOTE — Assessment & Plan Note (Signed)
Resolved,  Still using meclizine daily,  Advised to stop as it will not help the light headed feelingn he his having currently

## 2015-12-04 NOTE — Assessment & Plan Note (Signed)
Patient has known osteoporosis by DEXA and has completed < 5 years of alendronate, whch was stopped recently during workup for abd pian.  Advised to resume medication

## 2015-12-04 NOTE — Assessment & Plan Note (Signed)
Symptomatic with multiple medications likely contributing along with effects of agin. bp meds suspended.

## 2015-12-04 NOTE — Assessment & Plan Note (Signed)
Secondary to IBS>  Advised to try using Beano with meals, FODMAP diet  and continue simethicone.

## 2015-12-04 NOTE — Assessment & Plan Note (Signed)
Due to core strength weakness due to DJD hip,  Other multiple etiologies likely at play. Referral to PT for work on balance.

## 2015-12-04 NOTE — Assessment & Plan Note (Signed)
Offered trial of lexapro,  But he has deferred for now.

## 2015-12-04 NOTE — Assessment & Plan Note (Signed)
Diagnosed by prior sleep study. Patient is using CPAP every night a minimum of 6 hours per night but has not had a study in over 5 years.  Advised to have one but he has deferred for now.  

## 2015-12-07 ENCOUNTER — Telehealth: Payer: Self-pay | Admitting: Internal Medicine

## 2015-12-07 DIAGNOSIS — E785 Hyperlipidemia, unspecified: Secondary | ICD-10-CM

## 2015-12-07 DIAGNOSIS — R5383 Other fatigue: Secondary | ICD-10-CM

## 2015-12-07 DIAGNOSIS — E559 Vitamin D deficiency, unspecified: Secondary | ICD-10-CM

## 2015-12-07 NOTE — Telephone Encounter (Signed)
Can be scheduled for labs now. They are fasting.  Thanks,

## 2015-12-07 NOTE — Telephone Encounter (Signed)
Pt called about needing blood work CBC. Need orders please and thank you!

## 2015-12-07 NOTE — Telephone Encounter (Signed)
He does not need a cbc,  It was done Nov 27th at [facility] and was normal.  Does need fasting lipid panel and vit D which I have ordered

## 2015-12-07 NOTE — Telephone Encounter (Signed)
Last Labs were 10/31/15 in the hosptial, prior to that was 11/25/14.  Please advise and order.

## 2015-12-08 NOTE — Telephone Encounter (Signed)
Ok. I called pt and scheduled appt.

## 2015-12-09 ENCOUNTER — Encounter: Payer: Self-pay | Admitting: Internal Medicine

## 2015-12-10 ENCOUNTER — Telehealth: Payer: Self-pay | Admitting: Internal Medicine

## 2015-12-10 ENCOUNTER — Other Ambulatory Visit (INDEPENDENT_AMBULATORY_CARE_PROVIDER_SITE_OTHER): Payer: Medicare Other

## 2015-12-10 DIAGNOSIS — E785 Hyperlipidemia, unspecified: Secondary | ICD-10-CM | POA: Diagnosis not present

## 2015-12-10 DIAGNOSIS — R5383 Other fatigue: Secondary | ICD-10-CM | POA: Diagnosis not present

## 2015-12-10 DIAGNOSIS — E559 Vitamin D deficiency, unspecified: Secondary | ICD-10-CM

## 2015-12-10 LAB — VITAMIN D 25 HYDROXY (VIT D DEFICIENCY, FRACTURES): VITD: 41.69 ng/mL (ref 30.00–100.00)

## 2015-12-10 LAB — LIPID PANEL
Cholesterol: 124 mg/dL (ref 0–200)
HDL: 43.6 mg/dL (ref >39.00)
LDL Cholesterol: 65 mg/dL (ref 0–99)
NonHDL: 80.33
Total CHOL/HDL Ratio: 3
Triglycerides: 75 mg/dL (ref 0.0–149.0)
VLDL: 15 mg/dL (ref 0.0–40.0)

## 2015-12-10 LAB — TSH: TSH: 1.73 u[IU]/mL (ref 0.35–4.50)

## 2015-12-10 NOTE — Telephone Encounter (Signed)
Please advise that you received paperwork, thanks

## 2015-12-10 NOTE — Telephone Encounter (Signed)
IN yellow lab folder,

## 2015-12-10 NOTE — Telephone Encounter (Signed)
Pt dropped off blood pressure readings. Paper is in Dr. Lupita Dawn box.

## 2015-12-12 ENCOUNTER — Telehealth: Payer: Self-pay | Admitting: Internal Medicine

## 2015-12-12 ENCOUNTER — Encounter: Payer: Self-pay | Admitting: Internal Medicine

## 2015-12-12 DIAGNOSIS — I10 Essential (primary) hypertension: Secondary | ICD-10-CM

## 2015-12-12 NOTE — Assessment & Plan Note (Signed)
Advised to resume metoprolol 25 mg bid for home readisn as high as 159/93, oulse in the 80's,  No readings < 130/75

## 2015-12-12 NOTE — Telephone Encounter (Signed)
My Chart message sent

## 2015-12-13 ENCOUNTER — Ambulatory Visit: Payer: Medicare Other | Admitting: Physical Therapy

## 2015-12-14 ENCOUNTER — Other Ambulatory Visit: Payer: Self-pay | Admitting: Internal Medicine

## 2015-12-14 NOTE — Telephone Encounter (Signed)
Please advise. Medication fill on 10/15/15 with one refill.

## 2015-12-14 NOTE — Telephone Encounter (Signed)
Ok to refill,  Refill sent  

## 2015-12-16 ENCOUNTER — Encounter: Payer: Self-pay | Admitting: Physical Therapy

## 2015-12-16 ENCOUNTER — Ambulatory Visit: Payer: Medicare Other | Attending: Internal Medicine | Admitting: Physical Therapy

## 2015-12-16 DIAGNOSIS — M6281 Muscle weakness (generalized): Secondary | ICD-10-CM | POA: Insufficient documentation

## 2015-12-16 DIAGNOSIS — R194 Change in bowel habit: Secondary | ICD-10-CM | POA: Diagnosis not present

## 2015-12-16 DIAGNOSIS — R262 Difficulty in walking, not elsewhere classified: Secondary | ICD-10-CM | POA: Diagnosis not present

## 2015-12-16 DIAGNOSIS — R2681 Unsteadiness on feet: Secondary | ICD-10-CM | POA: Insufficient documentation

## 2015-12-16 DIAGNOSIS — R109 Unspecified abdominal pain: Secondary | ICD-10-CM | POA: Diagnosis not present

## 2015-12-16 NOTE — Therapy (Signed)
[place] [facility] [address], [place], [address] Phone: [phone]   Fax:  [phone]  Physical Therapy Evaluation  Patient Details  Name: David Atkins MRN: [id number] Date of Birth: 11-19-1943 Referring Provider: Derrel Nip  Encounter Date: 12/15/2018      PT End of Session - 12/16/15 1439    Visit Number 1   Number of Visits 13   Date for PT Re-Evaluation 01/30/16   Authorization Type g code   PT Start Time 0127   PT Stop Time 0230   PT Time Calculation (min) 63 min   Equipment Utilized During Treatment Gait belt   Activity Tolerance Patient tolerated treatment well   Behavior During Therapy [facility] for tasks assessed/performed      Past Medical History  Diagnosis Date  . Osteoporosis 2010    by D[facility] at [facility]  . BPH (benign prostatic hyperplasia)   . Hypertension   . Myocardial infarction ([place]) 2006  . basal cell   . 3-vessel coronary artery disease 2006    [place]  . Lumbar stenosis 12/03/13    MRI - lumbar pain with radiation down to LE  . Spondylolisthesis of lumbar region 11/23/13    MRI Lumbar spine  . OAB (overactive bladder)   . Hyperlipidemia   . Sleep apnea   . GERD (gastroesophageal reflux disease)     Past Surgical History  Procedure Laterality Date  . Appendectomy  1951  . Nasal septoplasty w/ turbinoplasty  1981  . Coronary artery bypass graft  2006    3 vessel, s/p AMI  . Colonoscopy    . Tonsillectomy    . Colonoscopy with propofol N/A 11/15/2015    Procedure: COLONOSCOPY WITH PROPOFOL;  Surgeon: Hulen Luster, MD;  Location: [facility] ENDOSCOPY;  Service: Gastroenterology;  Laterality: N/A;    There were no vitals filed for this visit.  Visit Diagnosis:  Muscle weakness - Plan: PT plan of care cert/re-cert  Difficulty walking - Plan: PT plan of care cert/re-cert  Unsteadiness on feet - Plan: PT plan of care cert/re-cert      Subjective Assessment - 12/16/15 1332    Subjective Pt  states that he has been noticing a decline in his ability to maintain his balance within the last six months.   Pertinent History Pt has 6 month h/o incr. difficulty with balance. Has had 1-2 falls in that time. Pt had fall two months ago after which he reports his balance decr. more quickly. Most difficulty with balance is when pt stands up quickly. Pt also reports difficulty with standing up from the ground due to (possibly) muscle weaknes. Pt also reports difficulty with turning/pivoting.   Limitations Standing   Diagnostic tests x-ray - found no blockages but pt did have possible fx at L1   Patient Stated Goals Pt stated that he would like to gain more stability when moving around and be able to help his wife without fear of losing his balance.   Currently in Pain? No/denies   Multiple Pain Sites No            [facility] PT Assessment - 12/16/15 0001    Assessment   Medical Diagnosis Difficulty turning   Referring Provider Tullo   Onset Date/Surgical Date 05/05/15   Hand Dominance Right   Prior Therapy none for this problem   Precautions   Precautions None   Restrictions   Weight Bearing Restrictions No   Balance Screen   Has  the patient fallen in the past 6 months Yes   How many times? 2   Has the patient had a decrease in activity level because of a fear of falling?  No   Is the patient reluctant to leave their home because of a fear of falling?  No   Home Ecologist residence   Living Arrangements Spouse/significant other   Available Help at Discharge Family   Type of [place] to enter;Level entry   [place] One level   [place] - single point   Prior Function   Level of [place] Retired   [employer] broadcast assistant, takes care of pool, assist with with ADLs   Cognition   Overall Cognitive Status Within Functional Limits for tasks assessed   Attention Focused   Focused Attention  Appears intact   Memory Appears intact   Awareness Appears intact   Problem Solving Appears intact   Executive Function Reasoning   Posture/Postural Control   Posture/Postural Control Postural limitations   Posture Comments FHP, incr. thoracic kyphosis, protracted shoulders, R foot externally rotated.    ROM / Strength   AROM / PROM / Strength AROM;Strength   AROM   Overall AROM Comments limited ROM with ankle inversion/eversion, worse with L, limited overall LE mobility as seen by compensatory trunk lean with strength assessment.   Strength   Overall Strength Comments All B unless noted: triceps 4/5, knee ext., 4/5, PF 2/5, DF 5/5, inv/ev 5/5, knee flex 4/5, ER 4/5, IR 4/5, L/R Grip Strength 46.67/62   Transfers   Five time sit to stand comments  15 s with UE support on green chair   Ambulation/Gait   Gait Comments 10 m walk speed: 0.91 m/s, TUG: 17.28", advancing leg crosses midline (worse on R) . Gait worsens with lateral head turns, significant change in gait quality with turning - gait slows, BOS incr. step length decr.   Balance   Balance Assessed Yes   Standardized Balance Assessment   Standardized Balance Assessment Berg Balance Test   Berg Balance Test   Sit to Stand Able to stand  independently using hands   Standing Unsupported Able to stand safely 2 minutes   Sitting with Back Unsupported but Feet Supported on Floor or Stool Able to sit safely and securely 2 minutes   Stand to Sit Controls descent by using hands   Transfers Able to transfer safely, definite need of hands   Standing Unsupported with Eyes Closed Able to stand 10 seconds safely   Standing Ubsupported with Feet Together Able to place feet together independently and stand for 1 minute with supervision   From Standing, Reach Forward with Outstretched Arm Can reach forward >12 cm safely (5")   From Standing Position, Pick up Object from Floor Able to pick up shoe, needs supervision   From Standing Position, Turn  to Look Behind Over each Shoulder Turn sideways only but maintains balance   Turn 360 Degrees Able to turn 360 degrees safely one side only in 4 seconds or less   Standing Unsupported, Alternately Place Feet on Step/Stool Able to stand independently and safely and complete 8 steps in 20 seconds   Standing Unsupported, One Foot in Front Able to take small step independently and hold 30 seconds   Standing on One Leg Able to lift leg independently and hold equal to or more than 3 seconds   Total Score 43  PT Education - 01-12-2016 1437    Education provided Yes   Education Details Educated pt to walk with SPC, importance of strength for balance, expected course of PT   Person(s) Educated Patient   Methods Explanation   Comprehension Verbalized understanding             PT Long Term Goals - 2016/01/12 1502    PT LONG TERM GOAL #1   Title pt will improve balance as seen by increased BERG by 5pts.    Baseline Baseline BERG: 53/56   Time 6   Period Weeks   Status New   PT LONG TERM GOAL #2   Title pt will have decreased difficulty turning to reduce risk of fall, demonstrated by reduced TUG <10 sec   Baseline TUG: 17.28 sec   Time 6   Period Weeks   Status New   PT LONG TERM GOAL #3   Title pt. will have increased LE strength demonstrated by the ability to stand up out of a chair w/o the use of UE.   Baseline currently requires moderate UE use during sit to stand    Time 6   Period Weeks   Status New               Plan - January 12, 2016 1440    Clinical Impression Statement Pt is a pleasant 82 y/o male with c/o 6 months incr. balance difficulty. pt. primarily c/o dizziness/loss of balance when transferring from sit to stand and at night getting up to the bathroom. Currently demonstrates LE weakness (4/5) generally for hip, knee and for ankle PF, and mild to moderately impaired balance during gait and transfers. Pt would benefit from  skilled PT to address these issues and allow return to woodworking, caring for spouse, increased activity level.    Pt will benefit from skilled therapeutic intervention in order to improve on the following deficits Abnormal gait;Decreased balance;Decreased coordination;Decreased endurance;Decreased mobility;Decreased range of motion;Decreased safety awareness;Decreased strength;Difficulty walking;Dizziness   Rehab Potential Good   Clinical Impairments Affecting Rehab Potential Con: age, comorbidities, polypharmacy Pro: motivated, active lifestyle   PT Frequency 2x / week   PT Duration 6 weeks   PT Treatment/Interventions ADLs/Self Care Home Management;Gait training;Stair training;Functional mobility training;Therapeutic exercise;Balance training;Neuromuscular re-education;Patient/family education;Aquatic Therapy;Therapeutic activities;Manual techniques;Passive range of motion;Vestibular   PT Next Visit Plan hip strengthening and balance training    Consulted and Agree with Plan of Care Patient          G-Codes - 01/12/2016 1610    Functional Assessment Tool Used BERG, TUG, 10 MWT,    Functional Limitation Mobility: Walking and moving around   Mobility: Walking and Moving Around Current Status [phone]) At least 1 percent but less than 20 percent impaired, limited or restricted   Mobility: Walking and Moving Around Goal Status [phone]) At least 1 percent but less than 20 percent impaired, limited or restricted       Problem List Patient Active Problem List   Diagnosis Date Noted  . Orthostasis 12/04/2015  . Balance problem 12/04/2015  . Generalized abdominal pain 11/10/2015  . Back pain 11/10/2015  . IBS (irritable bowel syndrome) 11/03/2015  . Compression fracture 11/03/2015  . Generalized anxiety disorder 01/20/2015  . Flatulence, eructation and gas pain 11/28/2014  . Overweight 05/24/2014  . Unspecified vitamin D deficiency 05/22/2014  . Allergic rhinitis 02/19/2014  . OSA on CPAP  01/19/2014  . Lumbar pain with radiation down left leg 12/01/2013  . Right hip pain 10/16/2013  .  Contact eczema 10/15/2013  . Routine general medical examination at a health care facility 01/13/2013  . GERD (gastroesophageal reflux disease) 01/06/2013  . Bilateral carotid artery stenosis 12/07/2012  . Hyperlipidemia LDL goal <70 07/09/2012  . Impaired glucose tolerance 07/09/2012  . Basal cell carcinoma 07/09/2012  . Vertigo, benign paroxysmal 07/09/2012  . Osteoporosis   . BPH (benign prostatic hyperplasia)   . 3-vessel coronary artery disease   . Hypertension   . Myocardial infarction ([place])     Kalicia Dufresne PT DPT 12/16/2015, 4:25 PM  [place] [facility] [address], [place], [address] Phone: [phone]   Fax:  [phone]  Name: Auri Mehrer MRN: [id number] Date of Birth: 01/25/1929

## 2015-12-20 ENCOUNTER — Ambulatory Visit: Payer: Medicare Other | Admitting: Physical Therapy

## 2015-12-20 ENCOUNTER — Encounter: Payer: Medicare Other | Admitting: Physical Therapy

## 2015-12-20 DIAGNOSIS — M6281 Muscle weakness (generalized): Secondary | ICD-10-CM | POA: Diagnosis not present

## 2015-12-20 DIAGNOSIS — R2681 Unsteadiness on feet: Secondary | ICD-10-CM | POA: Diagnosis not present

## 2015-12-20 DIAGNOSIS — R262 Difficulty in walking, not elsewhere classified: Secondary | ICD-10-CM

## 2015-12-20 NOTE — Therapy (Signed)
[place] [facility] [address] [place], [address] Phone: [phone]   Fax:  [phone]  Physical Therapy Treatment  Patient Details  Name: David Atkins MRN: [id number] Date of Birth: May 14, 1929 Referring Provider: Derrel Nip  Encounter Date: 12/20/2015      PT End of Session - 12/20/15 1214    Visit Number 2   PT Start Time 1115   PT Stop Time 1200   PT Time Calculation (min) 45 min   Equipment Utilized During Treatment Gait belt   Activity Tolerance Patient tolerated treatment well;No increased pain   Behavior During Therapy [facility] for tasks assessed/performed      Past Medical History  Diagnosis Date  . Osteoporosis 2010    by DEXA at [facility]  . BPH (benign prostatic hyperplasia)   . Hypertension   . Myocardial infarction ([place]) 2006  . basal cell   . 3-vessel coronary artery disease 2006    [place]  . Lumbar stenosis 12/03/13    MRI - lumbar pain with radiation down to LE  . Spondylolisthesis of lumbar region 11/23/13    MRI Lumbar spine  . OAB (overactive bladder)   . Hyperlipidemia   . Sleep apnea   . GERD (gastroesophageal reflux disease)     Past Surgical History  Procedure Laterality Date  . Appendectomy  1951  . Nasal septoplasty w/ turbinoplasty  1981  . Coronary artery bypass graft  2006    3 vessel, s/p AMI  . Colonoscopy    . Tonsillectomy    . Colonoscopy with propofol N/A 11/15/2015    Procedure: COLONOSCOPY WITH PROPOFOL;  Surgeon: Hulen Luster, MD;  Location: [facility] ENDOSCOPY;  Service: Gastroenterology;  Laterality: N/A;    There were no vitals filed for this visit.  Visit Diagnosis:  Muscle weakness  Unsteadiness on feet  Difficulty walking      Subjective Assessment - 12/20/15 1117    Subjective Pt was encouraged in continued use of cane, as cane was not present last session. Report he will continue using cain during ambulation until he "gets stronger and feels steadier." Pt  continues to have difficulty with stability and turning during ambulation at home and in the community. Pt is encouraged that PT will greatly help with ambulation confidence          Objective:   Leg press at 65# X 10, 75# 2X10. Pts knees tended to shift to the left, cueing required to keep legs in alignment, fully extend knees, and keeping feet stationary. Pt tolerated well.  Performed standing sidestep toe taps and standing hip extension toe taps 3X10. Cueing was needed to eliminate WB through toe upon tap.  Pt was instructed in three point turning strategy. Practiced in place turning X 10. Progressed to sit to stand with a short walk turnaround and return to sitting X 5. CGA required during all turning exercises.  Performed 1 min balance on foam pad with feet together X 3. Pt required occasional grasping of stabilization bar to maintain balance.                           PT Education - 12/20/15 1211    Education Details Instructed in proper performance of 3 point turning strategy. HEP performance and the importance of continued use of cane communicated    Person(s) Educated Patient   Methods Explanation;Demonstration;Verbal cues;Tactile cues   Comprehension Verbalized understanding;Returned demonstration;Verbal cues required;Tactile cues  required             PT Long Term Goals - 12/16/15 1502    PT LONG TERM GOAL #1   Title pt will improve balance as seen by increased BERG by 5pts.    Baseline Baseline BERG: 53/56   Time 6   Period Weeks   Status New   PT LONG TERM GOAL #2   Title pt will have decreased difficulty turning to reduce risk of fall, demonstrated by reduced TUG <10 sec   Baseline TUG: 17.28 sec   Time 6   Period Weeks   Status New   PT LONG TERM GOAL #3   Title pt. will have increased LE strength demonstrated by the ability to stand up out of a chair w/o the use of UE.   Baseline currently requires moderate UE use during sit to stand     Time 6   Period Weeks   Status New               Plan - 12/20/15 1217    Clinical Impression Statement Pt tolerated therapy well with no c/o of pain which he is encouraged about because initial PT sessions in the past have caused back pain. Progress LE strengthening, readdress 3 point turning techniques, and balancing on unstable surfaces          Problem List Patient Active Problem List   Diagnosis Date Noted  . Orthostasis 12/04/2015  . Balance problem 12/04/2015  . Generalized abdominal pain 11/10/2015  . Back pain 11/10/2015  . IBS (irritable bowel syndrome) 11/03/2015  . Compression fracture 11/03/2015  . Generalized anxiety disorder 01/20/2015  . Flatulence, eructation and gas pain 11/28/2014  . Overweight 05/24/2014  . Unspecified vitamin D deficiency 05/22/2014  . Allergic rhinitis 02/19/2014  . OSA on CPAP 01/19/2014  . Lumbar pain with radiation down left leg 12/01/2013  . Right hip pain 10/16/2013  . Contact eczema 10/15/2013  . Routine general medical examination at a health care facility 01/13/2013  . GERD (gastroesophageal reflux disease) 01/06/2013  . Bilateral carotid artery stenosis 12/07/2012  . Hyperlipidemia LDL goal <70 07/09/2012  . Impaired glucose tolerance 07/09/2012  . Basal cell carcinoma 07/09/2012  . Vertigo, benign paroxysmal 07/09/2012  . Osteoporosis   . BPH (benign prostatic hyperplasia)   . 3-vessel coronary artery disease   . Hypertension   . Myocardial infarction [facility])     Garfield Cornea 12/20/2015, 2:19 PM   Mont Dutton PT DPT  [place] [facility] [address] [place], [address] Phone: [phone]   Fax:  [phone]  Name: David Atkins MRN: [id number] Date of Birth: Jul 16, 1929

## 2015-12-20 NOTE — Therapy (Signed)
[place] [facility] [address], [place], [address] Phone: [phone]   Fax:  [phone]  Physical Therapy Treatment  Patient Details  Name: David Atkins MRN: [id number] Date of Birth: Nov 24, 1929 Referring Provider: Derrel Nip  Encounter Date: 12/20/2015      PT End of Session - 12/20/15 1214    Visit Number 2   Number of Visits 13   Date for PT Re-Evaluation 01/30/16   Authorization Type g code   PT Start Time 1115   PT Stop Time 1200   PT Time Calculation (min) 45 min   Equipment Utilized During Treatment Gait belt   Activity Tolerance Patient tolerated treatment well;No increased pain   Behavior During Therapy [facility] for tasks assessed/performed      Past Medical History  Diagnosis Date  . Osteoporosis 2010    by DEXA at [facility]  . BPH (benign prostatic hyperplasia)   . Hypertension   . Myocardial infarction ([place]) 2006  . basal cell   . 3-vessel coronary artery disease 2006    [place]  . Lumbar stenosis 12/03/13    MRI - lumbar pain with radiation down to LE  . Spondylolisthesis of lumbar region 11/23/13    MRI Lumbar spine  . OAB (overactive bladder)   . Hyperlipidemia   . Sleep apnea   . GERD (gastroesophageal reflux disease)     Past Surgical History  Procedure Laterality Date  . Appendectomy  1951  . Nasal septoplasty w/ turbinoplasty  1981  . Coronary artery bypass graft  2006    3 vessel, s/p AMI  . Colonoscopy    . Tonsillectomy    . Colonoscopy with propofol N/A 11/15/2015    Procedure: COLONOSCOPY WITH PROPOFOL;  Surgeon: Hulen Luster, MD;  Location: [facility] ENDOSCOPY;  Service: Gastroenterology;  Laterality: N/A;    There were no vitals filed for this visit.  Visit Diagnosis:  Muscle weakness  Unsteadiness on feet  Difficulty walking      Subjective Assessment - 12/20/15 1117    Subjective Pt reports no falls since previous session. Pt continues to have difficulty with stability and  turning during ambulation at home and in the community. Pt is encouraged and feels PT will greatly help with ambulation confidence    Pertinent History Pt has 6 month h/o incr. difficulty with balance. Has had 1-2 falls in that time. Pt had fall two months ago after which he reports his balance decr. more quickly. Most difficulty with balance is when pt stands up quickly. Pt also reports difficulty with standing up from the ground due to (possibly) muscle weaknes. Pt also reports difficulty with turning/pivoting.   Diagnostic tests x-ray - found no blockages but pt did have possible fx at L1   Patient Stated Goals Pt stated that he would like to gain more stability when moving around and be able to help his wife without fear of losing his balance.   Currently in Pain? No/denies   Multiple Pain Sites No           Objective:  Leg press at 65# X 10, 75# 2X10. Pts knees tended to shift to the left, cueing was required to keep legs in alignment, fully extend knees, and keeping feet stationary. Pt tolerated well.   Performed standing abduction toe taps and standing hip extension toe taps 3X10.  Cueing was needed to eliminate WB through toe upon tap.   Pt was extensively instructed in three point turning  strategy. Initially pt had difficulty understanding the pattern of 3 point turn around, but progressed to proper demonstration. Practiced in place turning X 10. Progressed to sit to stand w/ a short walk turnaround and return to sitting X 5. CGA required during all turning exercises.   Performed 2min balance on foam pad with feet together X 3. Pt required occasional grasping of stabilization bar to maintain balance.                       PT Education - 12/20/15 1211    Education Details Instructed in proper performance of 3 point turning strategy. HEP performance and the importance of continued use of cane communicated    Person(s) Educated Patient   Methods  Explanation;Demonstration;Verbal cues;Tactile cues   Comprehension Verbalized understanding;Returned demonstration;Verbal cues required;Tactile cues required             PT Long Term Goals - 12/16/15 1502    PT LONG TERM GOAL #1   Title pt will improve balance as seen by increased BERG by 5pts.    Baseline Baseline BERG: 53/56   Time 6   Period Weeks   Status New   PT LONG TERM GOAL #2   Title pt will have decreased difficulty turning to reduce risk of fall, demonstrated by reduced TUG <10 sec   Baseline TUG: 17.28 sec   Time 6   Period Weeks   Status New   PT LONG TERM GOAL #3   Title pt. will have increased LE strength demonstrated by the ability to stand up out of a chair w/o the use of UE.   Baseline currently requires moderate UE use during sit to stand    Time 6   Period Weeks   Status New               Plan - 12/20/15 1217    Clinical Impression Statement Pt was encouraged to continue with use of cane, as cane was not present last session. Reports he will continue using cane during ambulation until he "gets stronger and feels steadier."Pt tolerated therapy well with no c/o of pain which he is encouraged about because initial PT sessions in the past have caused back pain. Progress LE strengthening, readdress 3 point turning techniques, and balancing on unstable surfaces . Pt would benefit from further reinforcement of 3 point turn technique in varied situations, assessment of ability to manage obstacles with gait.        Problem List Patient Active Problem List   Diagnosis Date Noted  . Orthostasis 12/04/2015  . Balance problem 12/04/2015  . Generalized abdominal pain 11/10/2015  . Back pain 11/10/2015  . IBS (irritable bowel syndrome) 11/03/2015  . Compression fracture 11/03/2015  . Generalized anxiety disorder 01/20/2015  . Flatulence, eructation and gas pain 11/28/2014  . Overweight 05/24/2014  . Unspecified vitamin D deficiency 05/22/2014  .  Allergic rhinitis 02/19/2014  . OSA on CPAP 01/19/2014  . Lumbar pain with radiation down left leg 12/01/2013  . Right hip pain 10/16/2013  . Contact eczema 10/15/2013  . Routine general medical examination at a health care facility 01/13/2013  . GERD (gastroesophageal reflux disease) 01/06/2013  . Bilateral carotid artery stenosis 12/07/2012  . Hyperlipidemia LDL goal <70 07/09/2012  . Impaired glucose tolerance 07/09/2012  . Basal cell carcinoma 07/09/2012  . Vertigo, benign paroxysmal 07/09/2012  . Osteoporosis   . BPH (benign prostatic hyperplasia)   . 3-vessel coronary artery disease   .  Hypertension   . Myocardial infarction [facility])     David Atkins PT DPT 12/20/2015, 2:38 PM  [place] [facility] [address] [place], [address] Phone: [phone]   Fax:  [phone]  Name: David Atkins MRN: [id number] Date of Birth: 01/08/1929

## 2015-12-20 NOTE — Patient Instructions (Signed)
HEP and continued use of cane

## 2015-12-22 ENCOUNTER — Telehealth: Payer: Self-pay | Admitting: Internal Medicine

## 2015-12-22 DIAGNOSIS — K589 Irritable bowel syndrome without diarrhea: Secondary | ICD-10-CM

## 2015-12-22 NOTE — Telephone Encounter (Signed)
Pt called and states he would like a referral to a dietitian for his IBS. Call pt @ [phone]. Thank You!

## 2015-12-22 NOTE — Telephone Encounter (Signed)
I spoke to [place] [facility] and she said that they have dieticians that can see him for his IBS. If you can enter the referral I will send it over to them! Thanks! MT

## 2015-12-22 NOTE — Telephone Encounter (Signed)
Melissa,  Does [facility] have a dietician who can recommend diet for IBS?  T=I know the GI doctsor just give patients  a handout.

## 2015-12-23 ENCOUNTER — Encounter: Payer: Medicare Other | Admitting: Physical Therapy

## 2015-12-23 ENCOUNTER — Ambulatory Visit: Payer: Medicare Other | Admitting: Physical Therapy

## 2015-12-23 DIAGNOSIS — R262 Difficulty in walking, not elsewhere classified: Secondary | ICD-10-CM | POA: Diagnosis not present

## 2015-12-23 DIAGNOSIS — M6281 Muscle weakness (generalized): Secondary | ICD-10-CM

## 2015-12-23 DIAGNOSIS — R2681 Unsteadiness on feet: Secondary | ICD-10-CM | POA: Diagnosis not present

## 2015-12-23 NOTE — Therapy (Signed)
[place] [facility] [address] [place], [address] Phone: [phone]   Fax:  [phone]  Physical Therapy Treatment  Patient Details  Name: David Atkins MRN: [id number] Date of Birth: March 12, 1929 Referring Provider: Derrel Nip  Encounter Date: 12/23/2015      PT End of Session - 12/23/15 1216    Visit Number 3   Number of Visits 13   Date for PT Re-Evaluation 01/30/16   Authorization Type g code   PT Start Time [id number]   PT Stop Time 1230   PT Time Calculation (min) 45 min   Equipment Utilized During Treatment Gait belt   Activity Tolerance Patient tolerated treatment well;No increased pain   Behavior During Therapy [facility] for tasks assessed/performed      Past Medical History  Diagnosis Date  . Osteoporosis 2010    by DEXA at [facility]  . BPH (benign prostatic hyperplasia)   . Hypertension   . Myocardial infarction ([place]) 2006  . basal cell   . 3-vessel coronary artery disease 2006    [place]  . Lumbar stenosis 12/03/13    MRI - lumbar pain with radiation down to LE  . Spondylolisthesis of lumbar region 11/23/13    MRI Lumbar spine  . OAB (overactive bladder)   . Hyperlipidemia   . Sleep apnea   . GERD (gastroesophageal reflux disease)     Past Surgical History  Procedure Laterality Date  . Appendectomy  1951  . Nasal septoplasty w/ turbinoplasty  1981  . Coronary artery bypass graft  2006    3 vessel, s/p AMI  . Colonoscopy    . Tonsillectomy    . Colonoscopy with propofol N/A 11/15/2015    Procedure: COLONOSCOPY WITH PROPOFOL;  Surgeon: Hulen Luster, MD;  Location: [facility] ENDOSCOPY;  Service: Gastroenterology;  Laterality: N/A;    There were no vitals filed for this visit.  Visit Diagnosis:  Muscle weakness  Unsteadiness on feet  Difficulty walking      Subjective Assessment - 12/23/15 1142    Subjective Pt reports no falls since previous session. He has been walking about a half mile EOD with his  daughter but she will be leaving town and he wil no longer have someone to walk with. Reports he will be walking on his own in front of his house on his own    Pertinent History Pt has 6 month h/o incr. difficulty with balance. Has had 1-2 falls in that time. Pt had fall two months ago after which he reports his balance decr. more quickly. Most difficulty with balance is when pt stands up quickly. Pt also reports difficulty with standing up from the ground due to (possibly) muscle weaknes. Pt also reports difficulty with turning/pivoting.   Limitations Standing   Diagnostic tests x-ray - found no blockages but pt did have possible fx at L1   Patient Stated Goals Pt stated that he would like to gain more stability when moving around and be able to help his wife without fear of losing his balance.   Currently in Pain? No/denies       Objective:  Performed standing balance on foam mat w/perturbations 3X60 sec to address balance deficit on unstable surfaces. Pt tolerated well and required increasing levels of perturbations to offset balance with each set.  Performed 200 ft of tandem walking w/intentionally slowed gait to increase single leg stance time. Pt tolerated well, stated "this is the hardest exercise you have had me  do." Standing balance with pt mirroring PT hand motions to increase reaching distance while maintaining balance, performed with each hand X2 for 60 sec each.  Leg press performed 75# X12, 85# 2X12, 95# X10. Pt tolerated well, stated "now that was a workout" Cueing was needed to decrease UE support by pressing on thighs.                           PT Education - 12/23/15 1232    Education provided Yes   Education Details pt. was educated to continue HEP and walking w/o looking at [employer]) Educated Patient   Methods Explanation             PT Long Term Goals - 12/16/15 1502    PT LONG TERM GOAL #1   Title pt will improve balance as seen by  increased BERG by 5pts.    Baseline Baseline BERG: 53/56   Time 6   Period Weeks   Status New   PT LONG TERM GOAL #2   Title pt will have decreased difficulty turning to reduce risk of fall, demonstrated by reduced TUG <10 sec   Baseline TUG: 17.28 sec   Time 6   Period Weeks   Status New   PT LONG TERM GOAL #3   Title pt. will have increased LE strength demonstrated by the ability to stand up out of a chair w/o the use of UE.   Baseline currently requires moderate UE use during sit to stand    Time 6   Period Weeks   Status New               Plan - 12/23/15 1235    Clinical Impression Statement pt demonstrated difficulty walking with a narrowed base of support (tandemwalking), has limited stabiliy on unstable surfaces (foam mat), and is hesitant in turing and changing direction. Pt will bebefit from skill therapy to address balance deficits to better allow him to negotiation of his home and community environments    Pt will benefit from skilled therapeutic intervention in order to improve on the following deficits Abnormal gait;Difficulty walking;Decreased coordination;Decreased balance   Rehab Potential Good   Clinical Impairments Affecting Rehab Potential age, high motivation        Problem List Patient Active Problem List   Diagnosis Date Noted  . Orthostasis 12/04/2015  . Balance problem 12/04/2015  . Generalized abdominal pain 11/10/2015  . Back pain 11/10/2015  . IBS (irritable bowel syndrome) 11/03/2015  . Compression fracture 11/03/2015  . Generalized anxiety disorder 01/20/2015  . Flatulence, eructation and gas pain 11/28/2014  . Overweight 05/24/2014  . Unspecified vitamin D deficiency 05/22/2014  . Allergic rhinitis 02/19/2014  . OSA on CPAP 01/19/2014  . Lumbar pain with radiation down left leg 12/01/2013  . Right hip pain 10/16/2013  . Contact eczema 10/15/2013  . Routine general medical examination at a health care facility 01/13/2013  . GERD  (gastroesophageal reflux disease) 01/06/2013  . Bilateral carotid artery stenosis 12/07/2012  . Hyperlipidemia LDL goal <70 07/09/2012  . Impaired glucose tolerance 07/09/2012  . Basal cell carcinoma 07/09/2012  . Vertigo, benign paroxysmal 07/09/2012  . Osteoporosis   . BPH (benign prostatic hyperplasia)   . 3-vessel coronary artery disease   . Hypertension   . Myocardial infarction ([place])     Vinson Moselle Rij SPT 12/23/2015, 1:10 PM  Mont Dutton PT DPT [place] [facility] [address], [place], [address] Phone: [phone]   Fax:  [phone]  Name: David Atkins MRN: [id number] Date of Birth: 1929-09-26

## 2015-12-27 ENCOUNTER — Ambulatory Visit: Payer: Medicare Other | Admitting: Physical Therapy

## 2015-12-27 ENCOUNTER — Encounter: Payer: Medicare Other | Admitting: Physical Therapy

## 2015-12-27 DIAGNOSIS — M6281 Muscle weakness (generalized): Secondary | ICD-10-CM | POA: Diagnosis not present

## 2015-12-27 DIAGNOSIS — R262 Difficulty in walking, not elsewhere classified: Secondary | ICD-10-CM | POA: Diagnosis not present

## 2015-12-27 DIAGNOSIS — R2681 Unsteadiness on feet: Secondary | ICD-10-CM | POA: Diagnosis not present

## 2015-12-27 NOTE — Therapy (Signed)
[place] [facility] [address] [place], [address] Phone: [phone]   Fax:  [phone]  Physical Therapy Treatment  Patient Details  Name: David Atkins MRN: [id number] Date of Birth: [birth year] Referring Provider: Derrel Nip  Encounter Date: 12/27/2015      PT End of Session - 12/27/15 1210    Visit Number 4   Number of Visits 13   Date for PT Re-Evaluation 01/30/16   Authorization Type g code   PT Start Time 1115   PT Stop Time 1200   PT Time Calculation (min) 45 min   Equipment Utilized During Treatment Gait belt   Activity Tolerance Patient tolerated treatment well;No increased pain   Behavior During Therapy [facility] for tasks assessed/performed      Past Medical History  Diagnosis Date  . Osteoporosis 2010    by DEXA at [facility]  . BPH (benign prostatic hyperplasia)   . Hypertension   . Myocardial infarction ([place]) 2006  . basal cell   . 3-vessel coronary artery disease 2006    [place]  . Lumbar stenosis 12/03/13    MRI - lumbar pain with radiation down to LE  . Spondylolisthesis of lumbar region 11/23/13    MRI Lumbar spine  . OAB (overactive bladder)   . Hyperlipidemia   . Sleep apnea   . GERD (gastroesophageal reflux disease)     Past Surgical History  Procedure Laterality Date  . Appendectomy  1951  . Nasal septoplasty w/ turbinoplasty  1981  . Coronary artery bypass graft  2006    3 vessel, s/p AMI  . Colonoscopy    . Tonsillectomy    . Colonoscopy with propofol N/A 11/15/2015    Procedure: COLONOSCOPY WITH PROPOFOL;  Surgeon: Hulen Luster, MD;  Location: [facility] ENDOSCOPY;  Service: Gastroenterology;  Laterality: N/A;    There were no vitals filed for this visit.  Visit Diagnosis:  Unsteadiness on feet  Muscle weakness  Difficulty walking      Subjective Assessment - 12/27/15 1110    Subjective Pt reports no falls since previous session. Reports "I am feeling better already, maybe just  more confidence", reports adherence to HEP, Pt reports daughter lives with him/wife often, but she will leaving the country for a while as she often does for work   Pertinent History Pt has 6 month h/o incr. difficulty with balance. Has had 1-2 falls in that time. Pt had fall two months ago after which he reports his balance decr. more quickly. Most difficulty with balance is when pt stands up quickly. Pt also reports difficulty with standing up from the ground due to (possibly) muscle weaknes. Pt also reports difficulty with turning/pivoting.   Limitations Standing   Diagnostic tests x-ray - found no blockages but pt did have possible fx at L1   Patient Stated Goals Pt stated that he would like to gain more stability when moving around and be able to help his wife without fear of losing his balance.   Currently in Pain? No/denies      Objective:   Performed SLS for time to assess balance/symmetry. Pt demonstrated decreased balancing ability while on R LE, SLS for time X 5 each side, average 7 sec on R and 17 sec on L.  CGA required. Attempted three point turning strategy w/ little success, pt was very hesitant in initiating the technique and failed to fully turn around 180 deg, abandoned three point turning due to better turning performance  using pt's natural turning techniques. CGA required. Performed 20 ft walk back and forth w/ 180 turning combined with looking up/down/R/L X 10, verbal cues to decrease hesitation upon turn initiation and not to break stride when turning head. Pt tolerated well initially having difficulty turning without breaking stride for first 3-4 sets. CGA required. Attempted balancing on DynaDisc, switched to foam pad due to pt difficulties. Performed bicep curl in combination with shoulder press on foam pad 3X10, 5# for first set 3# for 2nd/3rd sets. Pt tolerated well with minimal loss of balance.  Sit to stand on bariatric mat table w/o use of UE 3X10, each set lowering the  table, final set at lowest setting. Pt tolerated well and showed moderate fatigue with slowed reps during last set.                             PT Education - 12/27/15 1207    Education provided Yes   Education Details Educated in performance of sit to stand w/o using hands from assistance, added to [employer]) Educated Patient   Methods Explanation;Demonstration   Comprehension Verbalized understanding;Returned demonstration             PT Long Term Goals - 12/16/15 1502    PT LONG TERM GOAL #1   Title pt will improve balance as seen by increased BERG by 5pts.    Baseline Baseline BERG: 53/56   Time 6   Period Weeks   Status New   PT LONG TERM GOAL #2   Title pt will have decreased difficulty turning to reduce risk of fall, demonstrated by reduced TUG <10 sec   Baseline TUG: 17.28 sec   Time 6   Period Weeks   Status New   PT LONG TERM GOAL #3   Title pt. will have increased LE strength demonstrated by the ability to stand up out of a chair w/o the use of UE.   Baseline currently requires moderate UE use during sit to stand    Time 6   Period Weeks   Status New               Plan - 12/27/15 1210    Clinical Impression Statement Pt is progressing well demonstraded by increased pt confidence. Continue targeted balance exercises, assess LE strength, assess hip motor control   Pt will benefit from skilled therapeutic intervention in order to improve on the following deficits Abnormal gait;Difficulty walking;Decreased coordination;Decreased balance   Rehab Potential Good   Clinical Impairments Affecting Rehab Potential age, high motivation   PT Frequency 2x / week   PT Duration 6 weeks   PT Treatment/Interventions ADLs/Self Care Home Management;Gait training;Stair training;Functional mobility training;Therapeutic exercise;Balance training;Neuromuscular re-education;Patient/family education;Aquatic Therapy;Therapeutic activities;Manual  techniques;Passive range of motion;Vestibular   PT Next Visit Plan hip strengthening and balance training         Problem List Patient Active Problem List   Diagnosis Date Noted  . Orthostasis 12/04/2015  . Balance problem 12/04/2015  . Generalized abdominal pain 11/10/2015  . Back pain 11/10/2015  . IBS (irritable bowel syndrome) 11/03/2015  . Compression fracture 11/03/2015  . Generalized anxiety disorder 01/20/2015  . Flatulence, eructation and gas pain 11/28/2014  . Overweight 05/24/2014  . Unspecified vitamin D deficiency 05/22/2014  . Allergic rhinitis 02/19/2014  . OSA on CPAP 01/19/2014  . Lumbar pain with radiation down left leg 12/01/2013  . Right hip pain 10/16/2013  .  Contact eczema 10/15/2013  . Routine general medical examination at a health care facility 01/13/2013  . GERD (gastroesophageal reflux disease) 01/06/2013  . Bilateral carotid artery stenosis 12/07/2012  . Hyperlipidemia LDL goal <70 07/09/2012  . Impaired glucose tolerance 07/09/2012  . Basal cell carcinoma 07/09/2012  . Vertigo, benign paroxysmal 07/09/2012  . Osteoporosis   . BPH (benign prostatic hyperplasia)   . 3-vessel coronary artery disease   . Hypertension   . Myocardial infarction ([place])     Vinson Moselle Rij SPT 12/27/2015, 12:15 PM   Mont Dutton PT DPT  [place] [facility] [address] [place], [address] Phone: [phone]   Fax:  [phone]  Name: David Atkins MRN: [id number] Date of Birth: December 01, 1929

## 2015-12-29 ENCOUNTER — Ambulatory Visit: Payer: Medicare Other | Admitting: Physical Therapy

## 2015-12-29 DIAGNOSIS — M6281 Muscle weakness (generalized): Secondary | ICD-10-CM | POA: Diagnosis not present

## 2015-12-29 DIAGNOSIS — R262 Difficulty in walking, not elsewhere classified: Secondary | ICD-10-CM

## 2015-12-29 DIAGNOSIS — R2681 Unsteadiness on feet: Secondary | ICD-10-CM

## 2015-12-29 NOTE — Therapy (Signed)
[place] [facility] [address], [place], [address] Phone: [phone]   Fax:  [phone]  Physical Therapy Treatment  Patient Details  Name: David Atkins MRN: [id number] Date of Birth: August 06, 1929 Referring Provider: Derrel Nip  Encounter Date: 12/29/2015      PT End of Session - 12/29/15 1216    Visit Number 5   Number of Visits 13   Date for PT Re-Evaluation 01/30/16   Authorization Type g code   PT Start Time 1000   PT Stop Time 1045   PT Time Calculation (min) 45 min   Equipment Utilized During Treatment Gait belt   Activity Tolerance Patient tolerated treatment well;No increased pain   Behavior During Therapy [facility] for tasks assessed/performed      Past Medical History  Diagnosis Date  . Osteoporosis 2010    by DEXA at [facility]  . BPH (benign prostatic hyperplasia)   . Hypertension   . Myocardial infarction ([place]) 2006  . basal cell   . 3-vessel coronary artery disease 2006    [place]  . Lumbar stenosis 12/03/13    MRI - lumbar pain with radiation down to LE  . Spondylolisthesis of lumbar region 11/23/13    MRI Lumbar spine  . OAB (overactive bladder)   . Hyperlipidemia   . Sleep apnea   . GERD (gastroesophageal reflux disease)     Past Surgical History  Procedure Laterality Date  . Appendectomy  1951  . Nasal septoplasty w/ turbinoplasty  1981  . Coronary artery bypass graft  2006    3 vessel, s/p AMI  . Colonoscopy    . Tonsillectomy    . Colonoscopy with propofol N/A 11/15/2015    Procedure: COLONOSCOPY WITH PROPOFOL;  Surgeon: Hulen Luster, MD;  Location: [facility] ENDOSCOPY;  Service: Gastroenterology;  Laterality: N/A;    There were no vitals filed for this visit.  Visit Diagnosis:  Difficulty walking  Muscle weakness  Unsteadiness on feet      Subjective Assessment - 12/29/15 0954    Subjective Pt roports no falls, continues to perform HEP, reports he feels more "stable and confident on my  feet"   Pertinent History Pt has 6 month h/o incr. difficulty with balance. Has had 1-2 falls in that time. Pt had fall two months ago after which he reports his balance decr. more quickly. Most difficulty with balance is when pt stands up quickly. Pt also reports difficulty with standing up from the ground due to (possibly) muscle weaknes. Pt also reports difficulty with turning/pivoting.   Limitations Standing   Diagnostic tests x-ray - found no blockages but pt did have possible fx at L1   Patient Stated Goals Pt stated that he would like to gain more stability when moving around and be able to help his wife without fear of losing his balance.   Currently in Pain? No/denies         Objective:   Performed turning exercise, 5 cones placed 3 feet apart, progressively turning around each one, returning to the starting position with each X10. Then performed weaving back and forth through cones then back to start position X4. Pt shows less hesitation when initiating turning as compared to previous sessions.  Squat touchdowns done at bariatric mat, progressively lowering mat with each set 4X12. Struggled to finish final set due to fatigue. Pt required cueing for breathing and proper knee tracking (left knee moving into valgus).  50 feet of crossover walking performed  X3. Pt expressed having great difficulty with this exercise, but performance improved with each set. 50 feet of crossover walking performed while looking up/down/side to side w/ PT cueing X3. Cueing for continued breathing occasionally required. 50 feet heel toe walking X6. Pt reported difficulty, but progressively improved with each set.   Foam pad balance w/ bicep curl combined with overhead press, 3# 3X6. Pt unable to complete last set due to L shoulder fatigue.                                 PT Education - 12/29/15 1215    Education provided Yes   Education Details instructed to keep knees tracking over  feet during squat   Person(s) Educated Patient   Methods Demonstration;Explanation   Comprehension Verbalized understanding;Returned demonstration             PT Long Term Goals - 12/16/15 1502    PT LONG TERM GOAL #1   Title pt will improve balance as seen by increased BERG by 5pts.    Baseline Baseline BERG: 53/56   Time 6   Period Weeks   Status New   PT LONG TERM GOAL #2   Title pt will have decreased difficulty turning to reduce risk of fall, demonstrated by reduced TUG <10 sec   Baseline TUG: 17.28 sec   Time 6   Period Weeks   Status New   PT LONG TERM GOAL #3   Title pt. will have increased LE strength demonstrated by the ability to stand up out of a chair w/o the use of UE.   Baseline currently requires moderate UE use during sit to stand    Time 6   Period Weeks   Status New               Plan - 12/29/15 1216    Clinical Impression Statement Pt presents with mild hesitation during gait and turn initiation and difficulty walking with narrowed BOS. Pt will benefit from continued LE strengthening to improve balance, and continued motor control training to improve dynamic stability   Pt will benefit from skilled therapeutic intervention in order to improve on the following deficits Abnormal gait;Difficulty walking;Decreased coordination;Decreased balance   Rehab Potential Good   Clinical Impairments Affecting Rehab Potential age, high motivation   PT Frequency 2x / week   PT Duration 6 weeks   PT Treatment/Interventions ADLs/Self Care Home Management;Gait training;Stair training;Functional mobility training;Therapeutic exercise;Balance training;Neuromuscular re-education;Patient/family education;Aquatic Therapy;Therapeutic activities;Manual techniques;Passive range of motion;Vestibular   PT Next Visit Plan hip strengthening and balance training         Problem List Patient Active Problem List   Diagnosis Date Noted  . Orthostasis 12/04/2015  . Balance  problem 12/04/2015  . Generalized abdominal pain 11/10/2015  . Back pain 11/10/2015  . IBS (irritable bowel syndrome) 11/03/2015  . Compression fracture 11/03/2015  . Generalized anxiety disorder 01/20/2015  . Flatulence, eructation and gas pain 11/28/2014  . Overweight 05/24/2014  . Unspecified vitamin D deficiency 05/22/2014  . Allergic rhinitis 02/19/2014  . OSA on CPAP 01/19/2014  . Lumbar pain with radiation down left leg 12/01/2013  . Right hip pain 10/16/2013  . Contact eczema 10/15/2013  . Routine general medical examination at a health care facility 01/13/2013  . GERD (gastroesophageal reflux disease) 01/06/2013  . Bilateral carotid artery stenosis 12/07/2012  . Hyperlipidemia LDL goal <70 07/09/2012  . Impaired glucose tolerance 07/09/2012  .  Basal cell carcinoma 07/09/2012  . Vertigo, benign paroxysmal 07/09/2012  . Osteoporosis   . BPH (benign prostatic hyperplasia)   . 3-vessel coronary artery disease   . Hypertension   . Myocardial infarction ([place])     Vinson Moselle Rij SPT 12/29/2015, 12:21 PM  Mont Dutton PT DPT  [place] [facility] [address], [place], [address] Phone: [phone]   Fax:  [phone]  Name: David Atkins MRN: [id number] Date of Birth: Apr 22, 1929

## 2015-12-30 ENCOUNTER — Encounter: Payer: Medicare Other | Admitting: Physical Therapy

## 2016-01-03 ENCOUNTER — Ambulatory Visit: Payer: Medicare Other | Admitting: Physical Therapy

## 2016-01-03 DIAGNOSIS — M6281 Muscle weakness (generalized): Secondary | ICD-10-CM | POA: Diagnosis not present

## 2016-01-03 DIAGNOSIS — R2681 Unsteadiness on feet: Secondary | ICD-10-CM | POA: Diagnosis not present

## 2016-01-03 DIAGNOSIS — R262 Difficulty in walking, not elsewhere classified: Secondary | ICD-10-CM | POA: Diagnosis not present

## 2016-01-03 NOTE — Therapy (Signed)
[place] [facility] [address], [place], [address] Phone: [phone]   Fax:  [phone]  Physical Therapy Treatment  Patient Details  Name: David Atkins MRN: [id number] Date of Birth: 02/11/1929 Referring Provider: Derrel Nip  Encounter Date: 01/03/2016      PT End of Session - 01/03/16 [id number]    Visit Number 6   Number of Visits 13   Date for PT Re-Evaluation 01/30/16   Authorization Type g code   PT Start Time [id number]   PT Stop Time 1130   PT Time Calculation (min) 45 min   Equipment Utilized During Treatment Gait belt   Activity Tolerance Patient tolerated treatment well;No increased pain   Behavior During Therapy [facility] for tasks assessed/performed      Past Medical History  Diagnosis Date  . Osteoporosis 2010    by DEXA at [facility]  . BPH (benign prostatic hyperplasia)   . Hypertension   . Myocardial infarction ([place]) 2006  . basal cell   . 3-vessel coronary artery disease 2006    [place]  . Lumbar stenosis 12/03/13    MRI - lumbar pain with radiation down to LE  . Spondylolisthesis of lumbar region 11/23/13    MRI Lumbar spine  . OAB (overactive bladder)   . Hyperlipidemia   . Sleep apnea   . GERD (gastroesophageal reflux disease)     Past Surgical History  Procedure Laterality Date  . Appendectomy  1951  . Nasal septoplasty w/ turbinoplasty  1981  . Coronary artery bypass graft  2006    3 vessel, s/p AMI  . Colonoscopy    . Tonsillectomy    . Colonoscopy with propofol N/A 11/15/2015    Procedure: COLONOSCOPY WITH PROPOFOL;  Surgeon: Hulen Luster, MD;  Location: [facility] ENDOSCOPY;  Service: Gastroenterology;  Laterality: N/A;    There were no vitals filed for this visit.  Visit Diagnosis:  Unsteadiness on feet  Muscle weakness      Subjective Assessment - 01/03/16 1042    Subjective pt reports "I feel like my balance is getting better" when asked. Reports feeling more stable on his feet   Pertinent  History Pt has 6 month h/o incr. difficulty with balance. Has had 1-2 falls in that time. Pt had fall two months ago after which he reports his balance decr. more quickly. Most difficulty with balance is when pt stands up quickly. Pt also reports difficulty with standing up from the ground due to (possibly) muscle weaknes. Pt also reports difficulty with turning/pivoting.   Limitations Standing   Diagnostic tests x-ray - found no blockages but pt did have possible fx at L1   Patient Stated Goals Pt stated that he would like to gain more stability when moving around and be able to help his wife without fear of losing his balance.   Currently in Pain? No/denies       Objective:  Performed calf raises with finger tips on stabilization bar for balance 3X12 w/ 3 sec holds at top. Pt demonstrated decrease clearance of heels at 1.5 in. Tolerated well reporting muscle fatigue in calf area. CGA required. Standing abduction done on machine at 40# 2X12. Standing adduction done on machine at 70# 2X10, pt tolerated well reporting fatigue in lateral hip area. Pt required regular cueing to decrease hip flexion and keep toes pointed forward. CGA required.   Bridging on high/low mat 3X10, pt reported difficulty with this exercise but no pain or discomfort. Pt  was shown proper log rolling method to get out of bed after stating "sometimes it's difficult getting out of bed". Log roll demonstrated w/ proper performance by pt thereafter. Will reassess for performance at next session.                          PT Education - 01/03/16 1052    Education provided Yes   Education Details pt instructed to continue deep breathing while performing exercises   Person(s) Educated Patient   Methods Explanation   Comprehension Verbalized understanding;Returned demonstration             PT Long Term Goals - 12/16/15 1502    PT LONG TERM GOAL #1   Title pt will improve balance as seen by increased  BERG by 5pts.    Baseline Baseline BERG: 53/56   Time 6   Period Weeks   Status New   PT LONG TERM GOAL #2   Title pt will have decreased difficulty turning to reduce risk of fall, demonstrated by reduced TUG <10 sec   Baseline TUG: 17.28 sec   Time 6   Period Weeks   Status New   PT LONG TERM GOAL #3   Title pt. will have increased LE strength demonstrated by the ability to stand up out of a chair w/o the use of UE.   Baseline currently requires moderate UE use during sit to stand    Time 6   Period Weeks   Status New               Plan - 01/03/16 1242    Clinical Impression Statement Pt is visibly less hesitant during gait and turning activities, presents with hip abduction weakness, and decreased plantar flexion during heel raises. Assess plantar flexion/hip extension, continue hip/LE strengthen and balance progression.    Pt will benefit from skilled therapeutic intervention in order to improve on the following deficits Abnormal gait;Difficulty walking;Decreased coordination;Decreased balance   Rehab Potential Good   Clinical Impairments Affecting Rehab Potential age, high motivation   PT Frequency 2x / week   PT Duration 6 weeks   PT Treatment/Interventions ADLs/Self Care Home Management;Gait training;Stair training;Functional mobility training;Therapeutic exercise;Balance training;Neuromuscular re-education;Patient/family education;Aquatic Therapy;Therapeutic activities;Manual techniques;Passive range of motion;Vestibular   PT Next Visit Plan hip strengthening and balance training         Problem List Patient Active Problem List   Diagnosis Date Noted  . Orthostasis 12/04/2015  . Balance problem 12/04/2015  . Generalized abdominal pain 11/10/2015  . Back pain 11/10/2015  . IBS (irritable bowel syndrome) 11/03/2015  . Compression fracture 11/03/2015  . Generalized anxiety disorder 01/20/2015  . Flatulence, eructation and gas pain 11/28/2014  . Overweight  05/24/2014  . Unspecified vitamin D deficiency 05/22/2014  . Allergic rhinitis 02/19/2014  . OSA on CPAP 01/19/2014  . Lumbar pain with radiation down left leg 12/01/2013  . Right hip pain 10/16/2013  . Contact eczema 10/15/2013  . Routine general medical examination at a health care facility 01/13/2013  . GERD (gastroesophageal reflux disease) 01/06/2013  . Bilateral carotid artery stenosis 12/07/2012  . Hyperlipidemia LDL goal <70 07/09/2012  . Impaired glucose tolerance 07/09/2012  . Basal cell carcinoma 07/09/2012  . Vertigo, benign paroxysmal 07/09/2012  . Osteoporosis   . BPH (benign prostatic hyperplasia)   . 3-vessel coronary artery disease   . Hypertension   . Myocardial infarction ([place])     Vinson Moselle Rij SPT 01/03/2016, 12:48  PM  Mont Dutton PT DPT  [place] [facility] [address] [place], [address] Phone: [phone]   Fax:  [phone]  Name: David Atkins MRN: [id number] Date of Birth: 12/28/1928

## 2016-01-06 ENCOUNTER — Ambulatory Visit: Payer: Medicare Other | Attending: Internal Medicine | Admitting: Physical Therapy

## 2016-01-06 ENCOUNTER — Encounter: Payer: Self-pay | Admitting: Physical Therapy

## 2016-01-06 DIAGNOSIS — M6281 Muscle weakness (generalized): Secondary | ICD-10-CM | POA: Diagnosis not present

## 2016-01-06 DIAGNOSIS — R2681 Unsteadiness on feet: Secondary | ICD-10-CM

## 2016-01-06 NOTE — Therapy (Signed)
[facility] [address], [place], [address] Phone: [phone]   Fax:  [phone]  Physical Therapy Treatment  Patient Details  Name: David Atkins MRN: [id number] Date of Birth: 10-09-1929 Referring Provider: Derrel Nip  Encounter Date: 01/06/2016      PT End of Session - 01/06/16 1043    Visit Number 7   Number of Visits 13   Date for PT Re-Evaluation 01/30/16   Authorization Type g code   PT Start Time 1000   PT Stop Time 1045   PT Time Calculation (min) 45 min   Equipment Utilized During Treatment Gait belt   Activity Tolerance Patient tolerated treatment well;No increased pain   Behavior During Therapy [facility] for tasks assessed/performed      Past Medical History  Diagnosis Date  . Osteoporosis 2010    by DEXA at [facility]  . BPH (benign prostatic hyperplasia)   . Hypertension   . Myocardial infarction ([place]) 2006  . basal cell   . 3-vessel coronary artery disease 2006    [place]  . Lumbar stenosis 12/03/13    MRI - lumbar pain with radiation down to LE  . Spondylolisthesis of lumbar region 11/23/13    MRI Lumbar spine  . OAB (overactive bladder)   . Hyperlipidemia   . Sleep apnea   . GERD (gastroesophageal reflux disease)     Past Surgical History  Procedure Laterality Date  . Appendectomy  1951  . Nasal septoplasty w/ turbinoplasty  1981  . Coronary artery bypass graft  2006    3 vessel, s/p AMI  . Colonoscopy    . Tonsillectomy    . Colonoscopy with propofol N/A 11/15/2015    Procedure: COLONOSCOPY WITH PROPOFOL;  Surgeon: Hulen Luster, MD;  Location: [facility] ENDOSCOPY;  Service: Gastroenterology;  Laterality: N/A;    There were no vitals filed for this visit.  Visit Diagnosis:  Unsteadiness on feet  Muscle weakness      Subjective Assessment - 01/06/16 1003    Subjective Pt reports no problems since last visit.  States that his balance is continuing to improve and experiences no pain  during the day.   Pertinent History Pt has 6 month h/o incr. difficulty with balance. Has had 1-2 falls in that time. Pt had fall two months ago after which he reports his balance decr. more quickly. Most difficulty with balance is when pt stands up quickly. Pt also reports difficulty with standing up from the ground due to (possibly) muscle weaknes. Pt also reports difficulty with turning/pivoting.   Limitations Standing   Diagnostic tests x-ray - found no blockages but pt did have possible fx at L1   Patient Stated Goals Pt stated that he would like to gain more stability when moving around and be able to help his wife without fear of losing his balance.   Currently in Pain? No/denies       Objective:  Balance exercises performed (BERG) to assess and incr pt's balance.  Pt scored a 51 on test.  During tandem stance PT noted a significant improvement since the last time it was tested.  SLS was not performed as well as last time possibly due to BERG being assessed prior to other warm up activities.  2x12 walking on 2x4 plank of wood to address pt's c/o decr balance.  Pt needed cuing to walk more slowly across the plank in order for him to be more challenged.  There was a  noted incr in difficulty during the second set, however, with cuing to look at the end of the plank, pt performance improved. Sit to stand 3x10 in green chair to incr strength in B LE to improve balance and functionality.  Pt very fatigued after exercise with cuing needed to keep weight in the heels rather than going forward on to the toes.                               PT Long Term Goals - 12/16/15 1502    PT LONG TERM GOAL #1   Title pt will improve balance as seen by increased BERG by 5pts.    Baseline Baseline BERG: 53/56   Time 6   Period Weeks   Status New   PT LONG TERM GOAL #2   Title pt will have decreased difficulty turning to reduce risk of fall, demonstrated by reduced TUG <10 sec    Baseline TUG: 17.28 sec   Time 6   Period Weeks   Status New   PT LONG TERM GOAL #3   Title pt. will have increased LE strength demonstrated by the ability to stand up out of a chair w/o the use of UE.   Baseline currently requires moderate UE use during sit to stand    Time 6   Period Weeks   Status New               Plan - 01/06/16 1033    Clinical Impression Statement Pt demonstrates inconsistancy with balancing activities, however has shown overall improvement as demonstrated by increased SLS time, and ability to attain and walk in full tandom positioning. Pt ambulating with min asist from single point cane.  Pt continues to progress in his ability to balance duirng ADLs and exercises.     Pt will benefit from skilled therapeutic intervention in order to improve on the following deficits Abnormal gait;Difficulty walking;Decreased coordination;Decreased balance   Rehab Potential Good   Clinical Impairments Affecting Rehab Potential age, high motivation   PT Frequency 2x / week   PT Duration 6 weeks   PT Treatment/Interventions ADLs/Self Care Home Management;Gait training;Stair training;Functional mobility training;Therapeutic exercise;Balance training;Neuromuscular re-education;Patient/family education;Aquatic Therapy;Therapeutic activities;Manual techniques;Passive range of motion;Vestibular   PT Next Visit Plan hip strengthening and balance training         Problem List Patient Active Problem List   Diagnosis Date Noted  . Orthostasis 12/04/2015  . Balance problem 12/04/2015  . Generalized abdominal pain 11/10/2015  . Back pain 11/10/2015  . IBS (irritable bowel syndrome) 11/03/2015  . Compression fracture 11/03/2015  . Generalized anxiety disorder 01/20/2015  . Flatulence, eructation and gas pain 11/28/2014  . Overweight 05/24/2014  . Unspecified vitamin D deficiency 05/22/2014  . Allergic rhinitis 02/19/2014  . OSA on CPAP 01/19/2014  . Lumbar pain with  radiation down left leg 12/01/2013  . Right hip pain 10/16/2013  . Contact eczema 10/15/2013  . Routine general medical examination at a health care facility 01/13/2013  . GERD (gastroesophageal reflux disease) 01/06/2013  . Bilateral carotid artery stenosis 12/07/2012  . Hyperlipidemia LDL goal <70 07/09/2012  . Impaired glucose tolerance 07/09/2012  . Basal cell carcinoma 07/09/2012  . Vertigo, benign paroxysmal 07/09/2012  . Osteoporosis   . BPH (benign prostatic hyperplasia)   . 3-vessel coronary artery disease   . Hypertension   . Myocardial infarction ([place])     Katherine Mantle SPT 01/06/2016, 10:58 AM  [facility] [address] [place], [address] Phone: [phone]   Fax:  [phone]  Name: David Atkins MRN: [id number] Date of Birth: [birth year]

## 2016-01-10 ENCOUNTER — Ambulatory Visit: Payer: Medicare Other | Admitting: Physical Therapy

## 2016-01-10 DIAGNOSIS — M6281 Muscle weakness (generalized): Secondary | ICD-10-CM | POA: Diagnosis not present

## 2016-01-10 DIAGNOSIS — R2681 Unsteadiness on feet: Secondary | ICD-10-CM | POA: Diagnosis not present

## 2016-01-10 NOTE — Therapy (Signed)
[facility] [address] [place], [address] Phone: [phone]   Fax:  [phone]  Physical Therapy Treatment  Patient Details  Name: David Atkins MRN: [id number] Date of Birth: 1929-01-30 Referring Provider: Derrel Nip  Encounter Date: 01/10/2016      PT End of Session - 01/10/16 1200    Visit Number 8   Number of Visits 13   Date for PT Re-Evaluation 01/30/16   Authorization Type g code   PT Start Time 1000   PT Stop Time 1045   PT Time Calculation (min) 45 min   Equipment Utilized During Treatment Gait belt   Activity Tolerance Patient tolerated treatment well;No increased pain   Behavior During Therapy [facility] for tasks assessed/performed      Past Medical History  Diagnosis Date  . Osteoporosis 2010    by DEXA at [facility]  . BPH (benign prostatic hyperplasia)   . Hypertension   . Myocardial infarction ([place]) 2006  . basal cell   . 3-vessel coronary artery disease 2006    [place]  . Lumbar stenosis 12/03/13    MRI - lumbar pain with radiation down to LE  . Spondylolisthesis of lumbar region 11/23/13    MRI Lumbar spine  . OAB (overactive bladder)   . Hyperlipidemia   . Sleep apnea   . GERD (gastroesophageal reflux disease)     Past Surgical History  Procedure Laterality Date  . Appendectomy  1951  . Nasal septoplasty w/ turbinoplasty  1981  . Coronary artery bypass graft  2006    3 vessel, s/p AMI  . Colonoscopy    . Tonsillectomy    . Colonoscopy with propofol N/A 11/15/2015    Procedure: COLONOSCOPY WITH PROPOFOL;  Surgeon: Hulen Luster, MD;  Location: [facility] ENDOSCOPY;  Service: Gastroenterology;  Laterality: N/A;    There were no vitals filed for this visit.  Visit Diagnosis:  Muscle weakness  Unsteadiness on feet      Subjective Assessment - 01/10/16 1147    Subjective Pt reports soreness after Thursday's session reporting feeling sore in the quads, soreness was gone by Friday. Reports  adherence to HEP, difficulty w/ heel toe walking but has a good spot to do it.   Pertinent History Pt has 6 month h/o incr. difficulty with balance. Has had 1-2 falls in that time. Pt had fall two months ago after which he reports his balance decr. more quickly. Most difficulty with balance is when pt stands up quickly. Pt also reports difficulty with standing up from the ground due to (possibly) muscle weaknes. Pt also reports difficulty with turning/pivoting.   Limitations Standing   Diagnostic tests x-ray - found no blockages but pt did have possible fx at L1   Patient Stated Goals Pt stated that he would like to gain more stability when moving around and be able to help his wife without fear of losing his balance.   Currently in Pain? No/denies      Objective:  Pt performed tandem walking in combination with looking up/down/side to side upon PT cues 4X10 m. Pt tolerated with difficulty requiring cueing for increased foot clearance and continuation of walking while looking. Pt has a tendency to stop walking when being cued for cervical movement possibly due to reaching balance threshold so will continue to address this. Monster walks performed 4 sets at 14m w/ RTB. Pt tolerated well, reported feeling fatigue/soreness in hip area. Visibly fatigued by end of last set,  unable to maintain full control when bringing feet back together.  Leg press performed at 75# X12, 85# 2X12, pt tolerated well reporting feeling soreness in thighs with this exercise. When lifting legs into position onto leg press platform pt experienced minor sharp pain in L greater trochanter area, which resolved upon standing and stepping in place.                            PT Education - 01/10/16 1152    Education provided Yes   Education Details instructed to ensure a stabilizing surface is available when performing tandem walking at home   Person(s) Educated Patient   Methods Explanation    Comprehension Verbalized understanding             PT Long Term Goals - 12/16/15 1502    PT LONG TERM GOAL #1   Title pt will improve balance as seen by increased BERG by 5pts.    Baseline Baseline BERG: 53/56   Time 6   Period Weeks   Status New   PT LONG TERM GOAL #2   Title pt will have decreased difficulty turning to reduce risk of fall, demonstrated by reduced TUG <10 sec   Baseline TUG: 17.28 sec   Time 6   Period Weeks   Status New   PT LONG TERM GOAL #3   Title pt. will have increased LE strength demonstrated by the ability to stand up out of a chair w/o the use of UE.   Baseline currently requires moderate UE use during sit to stand    Time 6   Period Weeks   Status New               Plan - 01/10/16 1200    Clinical Impression Statement Pt is progressing well as seen by increased SLS time, increased tolerance of tandem walking, and increased confidence perfuming ADLs and yard/shop activities. Pt will continue to benefit from skilled PT to further improve upon balance and strength deficits. Progress to cross body activities to further challenge balance and getting off the floor on own.    Pt will benefit from skilled therapeutic intervention in order to improve on the following deficits Abnormal gait;Difficulty walking;Decreased coordination;Decreased balance   Rehab Potential Good   Clinical Impairments Affecting Rehab Potential age, high motivation   PT Frequency 2x / week   PT Duration 6 weeks   PT Treatment/Interventions ADLs/Self Care Home Management;Gait training;Stair training;Functional mobility training;Therapeutic exercise;Balance training;Neuromuscular re-education;Patient/family education;Aquatic Therapy;Therapeutic activities;Manual techniques;Passive range of motion;Vestibular   PT Next Visit Plan hip strengthening and balance training         Problem List Patient Active Problem List   Diagnosis Date Noted  . Orthostasis 12/04/2015  .  Balance problem 12/04/2015  . Generalized abdominal pain 11/10/2015  . Back pain 11/10/2015  . IBS (irritable bowel syndrome) 11/03/2015  . Compression fracture 11/03/2015  . Generalized anxiety disorder 01/20/2015  . Flatulence, eructation and gas pain 11/28/2014  . Overweight 05/24/2014  . Unspecified vitamin D deficiency 05/22/2014  . Allergic rhinitis 02/19/2014  . OSA on CPAP 01/19/2014  . Lumbar pain with radiation down left leg 12/01/2013  . Right hip pain 10/16/2013  . Contact eczema 10/15/2013  . Routine general medical examination at a health care facility 01/13/2013  . GERD (gastroesophageal reflux disease) 01/06/2013  . Bilateral carotid artery stenosis 12/07/2012  . Hyperlipidemia LDL goal <70 07/09/2012  . Impaired glucose tolerance  07/09/2012  . Basal cell carcinoma 07/09/2012  . Vertigo, benign paroxysmal 07/09/2012  . Osteoporosis   . BPH (benign prostatic hyperplasia)   . 3-vessel coronary artery disease   . Hypertension   . Myocardial infarction ([place])     Vinson Moselle Rij SPT 01/10/2016, 12:07 PM  Mont Dutton PT DPT  [facility] [address], [place], [address] Phone: [phone]   Fax:  [phone]  Name: David Atkins MRN: [id number] Date of Birth: January 16, 1929

## 2016-01-13 ENCOUNTER — Ambulatory Visit: Payer: Medicare Other | Admitting: Physical Therapy

## 2016-01-13 ENCOUNTER — Other Ambulatory Visit: Payer: Self-pay | Admitting: Internal Medicine

## 2016-01-13 DIAGNOSIS — R2681 Unsteadiness on feet: Secondary | ICD-10-CM | POA: Diagnosis not present

## 2016-01-13 DIAGNOSIS — M6281 Muscle weakness (generalized): Secondary | ICD-10-CM | POA: Diagnosis not present

## 2016-01-13 NOTE — Therapy (Signed)
[facility] [address], [place], [address] Phone: [phone]   Fax:  [phone]  Physical Therapy Treatment  Patient Details  Name: David Atkins MRN: [id number] Date of Birth: January 20, 1929 Referring Provider: Derrel Nip  Encounter Date: 01/13/2016      PT End of Session - 01/13/16 1218    Visit Number 9   Number of Visits 13   Date for PT Re-Evaluation 01/30/16   Authorization Type g code   PT Start Time 1000   PT Stop Time 1045   PT Time Calculation (min) 45 min   Equipment Utilized During Treatment Gait belt   Activity Tolerance Patient tolerated treatment well;No increased pain   Behavior During Therapy [facility] for tasks assessed/performed      Past Medical History  Diagnosis Date  . Osteoporosis 2010    by DEXA at [facility]  . BPH (benign prostatic hyperplasia)   . Hypertension   . Myocardial infarction ([place]) 2006  . basal cell   . 3-vessel coronary artery disease 2006    [place]  . Lumbar stenosis 12/03/13    MRI - lumbar pain with radiation down to LE  . Spondylolisthesis of lumbar region 11/23/13    MRI Lumbar spine  . OAB (overactive bladder)   . Hyperlipidemia   . Sleep apnea   . GERD (gastroesophageal reflux disease)     Past Surgical History  Procedure Laterality Date  . Appendectomy  1951  . Nasal septoplasty w/ turbinoplasty  1981  . Coronary artery bypass graft  2006    3 vessel, s/p AMI  . Colonoscopy    . Tonsillectomy    . Colonoscopy with propofol N/A 11/15/2015    Procedure: COLONOSCOPY WITH PROPOFOL;  Surgeon: Hulen Luster, MD;  Location: [facility] ENDOSCOPY;  Service: Gastroenterology;  Laterality: N/A;    There were no vitals filed for this visit.  Visit Diagnosis:  Unsteadiness on feet  Muscle weakness      Subjective Assessment - 01/13/16 1213    Subjective Pt reports not performing all of HEP since last visit, pt was however able to continue practicing tandem walking which  he reports to remain very difficult. Pt requested recomendations for wt loss specifically for belly fat.    Pertinent History Pt has 6 month h/o incr. difficulty with balance. Has had 1-2 falls in that time. Pt had fall two months ago after which he reports his balance decr. more quickly. Most difficulty with balance is when pt stands up quickly. Pt also reports difficulty with standing up from the ground due to (possibly) muscle weaknes. Pt also reports difficulty with turning/pivoting.   Limitations Standing   Diagnostic tests x-ray - found no blockages but pt did have possible fx at L1   Patient Stated Goals Pt stated that he would like to gain more stability when moving around and be able to help his wife without fear of losing his balance.        Objective:   Performed cross body reaching exercise while standing on foam pad to address stability concerns while on uneven surfaces. 12X each UE reaching across body as far as possible without repositioning feet. Pt tolerated well with no loss of balance and slow controlled maximal reaching. Pt noticeably shaky in UE when at end range reaching.  Practiced getting up off the floor using wall and chair assistance and attempted getting off floor with no assistance from surrounding objects 3x chair assistance, 2X no  assistance one with each leg leading. CGA required.  Pt naturally performs ideal standing technique with little cueing needed for improvement. Pt lacked adequate strength to successfully stand without object assistance. Discussed UE/LE strength requirements to be addressed with this.   Performed sit<>stand taps on bariatric table, each set progressively lowering mat with the final set mat maximally lowered, 4X12. Pt tolerated well indicating soreness in quads area. Notably fatigued by final set. Performed chest press on Omega machine at 5# 3X8. This exercise proved very difficult for pt w/ regular cueing for shoulder depression and  encouragement for full elbow extension. PT assistance was needed from terminal elbow extension for final repetitions. Pt was shown and demonstrated chest press exercise w/ GTB and instructed to add this exercise to HEP.  Assessed pt walk along 2X4 board X6. Pt seems to be improving in this exercise but continues to struggle occasionally making it across the 2ft board length w/o having to step off due to loss of balance.  CGA required.                          PT Education - 01/13/16 1215    Education provided Yes   Education Details Upon request pt was informed diet modifications will be most effective method to address wt loss. Instructed in proper performance of chest press exercise w/ GTB   Person(s) Educated Patient   Methods Explanation;Demonstration;Tactile cues;Verbal cues   Comprehension Verbal cues required;Returned demonstration;Verbalized understanding             PT Long Term Goals - 12/16/15 1502    PT LONG TERM GOAL #1   Title pt will improve balance as seen by increased BERG by 5pts.    Baseline Baseline BERG: 53/56   Time 6   Period Weeks   Status New   PT LONG TERM GOAL #2   Title pt will have decreased difficulty turning to reduce risk of fall, demonstrated by reduced TUG <10 sec   Baseline TUG: 17.28 sec   Time 6   Period Weeks   Status New   PT LONG TERM GOAL #3   Title pt. will have increased LE strength demonstrated by the ability to stand up out of a chair w/o the use of UE.   Baseline currently requires moderate UE use during sit to stand    Time 6   Period Weeks   Status New               Plan - 01/13/16 1218    Clinical Impression Statement Pt demonstrates inadequate UE strength needed to independently get off the floor w/o object assistance. Pt has improved in LE strength significantly. Pt will benefit from independent ability to perform a long term HEP maintenance program, cardio vascular maintenance, and further UE  strength training. Discuss comunity exercise classes.    Pt will benefit from skilled therapeutic intervention in order to improve on the following deficits Abnormal gait;Difficulty walking;Decreased coordination;Decreased balance   Rehab Potential Good   Clinical Impairments Affecting Rehab Potential age, high motivation   PT Frequency 2x / week   PT Duration 6 weeks   PT Treatment/Interventions ADLs/Self Care Home Management;Gait training;Stair training;Functional mobility training;Therapeutic exercise;Balance training;Neuromuscular re-education;Patient/family education;Aquatic Therapy;Therapeutic activities;Manual techniques;Passive range of motion;Vestibular   PT Next Visit Plan hip strengthening and balance training         Problem List Patient Active Problem List   Diagnosis Date Noted  . Orthostasis 12/04/2015  .  Balance problem 12/04/2015  . Generalized abdominal pain 11/10/2015  . Back pain 11/10/2015  . IBS (irritable bowel syndrome) 11/03/2015  . Compression fracture 11/03/2015  . Generalized anxiety disorder 01/20/2015  . Flatulence, eructation and gas pain 11/28/2014  . Overweight 05/24/2014  . Unspecified vitamin D deficiency 05/22/2014  . Allergic rhinitis 02/19/2014  . OSA on CPAP 01/19/2014  . Lumbar pain with radiation down left leg 12/01/2013  . Right hip pain 10/16/2013  . Contact eczema 10/15/2013  . Routine general medical examination at a health care facility 01/13/2013  . GERD (gastroesophageal reflux disease) 01/06/2013  . Bilateral carotid artery stenosis 12/07/2012  . Hyperlipidemia LDL goal <70 07/09/2012  . Impaired glucose tolerance 07/09/2012  . Basal cell carcinoma 07/09/2012  . Vertigo, benign paroxysmal 07/09/2012  . Osteoporosis   . BPH (benign prostatic hyperplasia)   . 3-vessel coronary artery disease   . Hypertension   . Myocardial infarction ([place])     Vinson Moselle Rij SPT 01/13/2016, 12:20 PM  Mont Dutton PT DPT  [facility] [address] [place], [address] Phone: [phone]   Fax:  [phone]  Name: David Atkins MRN: [id number] Date of Birth: 28-Oct-1929

## 2016-01-17 ENCOUNTER — Ambulatory Visit: Payer: Medicare Other | Admitting: Physical Therapy

## 2016-01-17 DIAGNOSIS — M6281 Muscle weakness (generalized): Secondary | ICD-10-CM | POA: Diagnosis not present

## 2016-01-17 DIAGNOSIS — R2681 Unsteadiness on feet: Secondary | ICD-10-CM

## 2016-01-17 NOTE — Therapy (Signed)
[facility] [address], [place], [address] Phone: [phone]   Fax:  [phone]  Physical Therapy Treatment/Discharge  Patient Details  Name: David Atkins MRN: [id number] Date of Birth: 06-Apr-1929 Referring Provider: Derrel Nip  Encounter Date: 01/17/2016      PT End of Session - 01/17/16 1135    Visit Number 10   Number of Visits 13   Date for PT Re-Evaluation 01/30/16   Authorization Type g code   PT Start Time 1000   PT Stop Time 1045   PT Time Calculation (min) 45 min   Equipment Utilized During Treatment Gait belt   Activity Tolerance Patient tolerated treatment well;No increased pain   Behavior During Therapy [facility] for tasks assessed/performed      Past Medical History  Diagnosis Date  . Osteoporosis 2010    by DEXA at [facility]  . BPH (benign prostatic hyperplasia)   . Hypertension   . Myocardial infarction ([place]) 2006  . basal cell   . 3-vessel coronary artery disease 2006    [place]  . Lumbar stenosis 12/03/13    MRI - lumbar pain with radiation down to LE  . Spondylolisthesis of lumbar region 11/23/13    MRI Lumbar spine  . OAB (overactive bladder)   . Hyperlipidemia   . Sleep apnea   . GERD (gastroesophageal reflux disease)     Past Surgical History  Procedure Laterality Date  . Appendectomy  1951  . Nasal septoplasty w/ turbinoplasty  1981  . Coronary artery bypass graft  2006    3 vessel, s/p AMI  . Colonoscopy    . Tonsillectomy    . Colonoscopy with propofol N/A 11/15/2015    Procedure: COLONOSCOPY WITH PROPOFOL;  Surgeon: Hulen Luster, MD;  Location: [facility] ENDOSCOPY;  Service: Gastroenterology;  Laterality: N/A;    There were no vitals filed for this visit.  Visit Diagnosis:  Unsteadiness on feet  Muscle weakness      Subjective Assessment - 01/17/16 1132    Subjective Reports he is having allergies and not feeling at his best today, "I may not do as well with exercises".  Reports doing chest press with GTH and feels this exercise is helping. Repots he is feeling strong enough that this session will be the last for the time being. Pt reports feeling strong and more stable on his feet, but believes his "balance problem is in the head." Has an appointment scheduled for a sleep apnea assessment, after which he may readdress balance limitations via vestibular specialist.                                                                                                                                 Pertinent History Pt has 6 month h/o incr. difficulty with balance. Has had 1-2 falls in that time. Pt had fall two months ago after which he reports his balance decr. more  quickly. Most difficulty with balance is when pt stands up quickly. Pt also reports difficulty with standing up from the ground due to (possibly) muscle weaknes. Pt also reports difficulty with turning/pivoting.   Limitations Standing   Diagnostic tests x-ray - found no blockages but pt did have possible fx at L1   Patient Stated Goals Pt stated that he would like to gain more stability when moving around and be able to help his wife without fear of losing his balance.   Currently in Pain? No/denies       Objective:  Performed wall pushup 3X10, cueing required to depress shoulders and maintain continuous breathing. Also briefly showed and had demonstrate proper push up on elevated mat for advancing chest press option for long term HEP. Performed body wt. lunges 2X10 each LE, pt visibly challenged with this exercise as seen with shaking and difficulty completing final set.    Performed chest press on Omega machine w/ 5# 3X8, final set pt only performed eccentric component w/ PT assistance during concentric phase. This exercise was very difficult as seen by inability to fully extend elbows with final repetitions and needing PT assistance.                           PT Education - 01/17/16 1135     Education provided Yes   Education Details Extensively discussed of long term HEP to maintain strength, balance, and progress UE strength. Advised and provided contact information for local senior fitness classes to address expressed wt. loss, and general fitness/balance. Informed about vestibular specialized PT to address "balance problem is in my head", and to call when he is ready for a referral.     Person(s) Educated Patient   Methods Explanation;Demonstration;Tactile cues;Verbal cues   Comprehension Returned demonstration;Verbalized understanding;Verbal cues required             PT Long Term Goals - 12/16/15 1502    PT LONG TERM GOAL #1   Title pt will improve balance as seen by increased BERG by 5pts.    Baseline Baseline BERG: 53/56   Time 6   Period Weeks   Status New   PT LONG TERM GOAL #2   Title pt will have decreased difficulty turning to reduce risk of fall, demonstrated by reduced TUG <10 sec   Baseline TUG: 17.28 sec   Time 6   Period Weeks   Status New   PT LONG TERM GOAL #3   Title pt. will have increased LE strength demonstrated by the ability to stand up out of a chair w/o the use of UE.   Baseline currently requires moderate UE use during sit to stand    Time 6   Period Weeks   Status New               Plan - 01/17/16 1136    Clinical Impression Statement Pt shows significant improvement in LE strength, tandem walking and general stability during ambulation. Pt has expressed concerns of balance being vestibular related, however he wants to address other health related concerns (sleep apnea) prior to further balance assessment. Pt was provided a long term HEP to maintain strength, balance, and progress UE strength. Pt was provided contact information and showed interest in local senior fitness classes to address wt. loss, general fitness, and balance. Informed about vestibular specialized PT to address further balance concerns when other health  concerns have been sorted out.  Pt will benefit from skilled therapeutic intervention in order to improve on the following deficits Abnormal gait;Difficulty walking;Decreased coordination;Decreased balance   Rehab Potential Good   Clinical Impairments Affecting Rehab Potential age, high motivation   PT Frequency 2x / week   PT Duration 6 weeks   PT Treatment/Interventions ADLs/Self Care Home Management;Gait training;Stair training;Functional mobility training;Therapeutic exercise;Balance training;Neuromuscular re-education;Patient/family education;Aquatic Therapy;Therapeutic activities;Manual techniques;Passive range of motion;Vestibular   PT Next Visit Plan hip strengthening and balance training         Problem List Patient Active Problem List   Diagnosis Date Noted  . Orthostasis 12/04/2015  . Balance problem 12/04/2015  . Generalized abdominal pain 11/10/2015  . Back pain 11/10/2015  . IBS (irritable bowel syndrome) 11/03/2015  . Compression fracture 11/03/2015  . Generalized anxiety disorder 01/20/2015  . Flatulence, eructation and gas pain 11/28/2014  . Overweight 05/24/2014  . Unspecified vitamin D deficiency 05/22/2014  . Allergic rhinitis 02/19/2014  . OSA on CPAP 01/19/2014  . Lumbar pain with radiation down left leg 12/01/2013  . Right hip pain 10/16/2013  . Contact eczema 10/15/2013  . Routine general medical examination at a health care facility 01/13/2013  . GERD (gastroesophageal reflux disease) 01/06/2013  . Bilateral carotid artery stenosis 12/07/2012  . Hyperlipidemia LDL goal <70 07/09/2012  . Impaired glucose tolerance 07/09/2012  . Basal cell carcinoma 07/09/2012  . Vertigo, benign paroxysmal 07/09/2012  . Osteoporosis   . BPH (benign prostatic hyperplasia)   . 3-vessel coronary artery disease   . Hypertension   . Myocardial infarction ([place])     Garfield Cornea SPT 01/17/2016, 11:38 AM  Mont Dutton PT DPT  [facility] [address], [place], [address] Phone: [phone]   Fax:  [phone]  Name: David Atkins MRN: [id number] Date of Birth: 1929-09-23

## 2016-01-18 DIAGNOSIS — Z0000 Encounter for general adult medical examination without abnormal findings: Secondary | ICD-10-CM | POA: Diagnosis not present

## 2016-01-18 DIAGNOSIS — R350 Frequency of micturition: Secondary | ICD-10-CM | POA: Diagnosis not present

## 2016-01-18 DIAGNOSIS — N401 Enlarged prostate with lower urinary tract symptoms: Secondary | ICD-10-CM | POA: Diagnosis not present

## 2016-01-20 ENCOUNTER — Encounter: Payer: Medicare Other | Admitting: Physical Therapy

## 2016-01-24 ENCOUNTER — Encounter: Payer: Medicare Other | Admitting: Physical Therapy

## 2016-01-24 ENCOUNTER — Other Ambulatory Visit: Payer: Self-pay | Admitting: Internal Medicine

## 2016-01-27 ENCOUNTER — Encounter: Payer: Medicare Other | Admitting: Physical Therapy

## 2016-02-13 ENCOUNTER — Other Ambulatory Visit: Payer: Self-pay | Admitting: Internal Medicine

## 2016-02-18 DIAGNOSIS — R42 Dizziness and giddiness: Secondary | ICD-10-CM | POA: Diagnosis not present

## 2016-02-18 DIAGNOSIS — H6123 Impacted cerumen, bilateral: Secondary | ICD-10-CM | POA: Diagnosis not present

## 2016-02-18 DIAGNOSIS — G4733 Obstructive sleep apnea (adult) (pediatric): Secondary | ICD-10-CM | POA: Diagnosis not present

## 2016-03-15 ENCOUNTER — Ambulatory Visit: Payer: Medicare Other | Attending: Otolaryngology

## 2016-03-15 DIAGNOSIS — G4733 Obstructive sleep apnea (adult) (pediatric): Secondary | ICD-10-CM | POA: Diagnosis not present

## 2016-03-20 ENCOUNTER — Other Ambulatory Visit: Payer: Self-pay | Admitting: Internal Medicine

## 2016-03-23 DIAGNOSIS — G4733 Obstructive sleep apnea (adult) (pediatric): Secondary | ICD-10-CM | POA: Diagnosis not present

## 2016-04-20 ENCOUNTER — Other Ambulatory Visit: Payer: Self-pay | Admitting: Internal Medicine

## 2016-04-20 DIAGNOSIS — Z85828 Personal history of other malignant neoplasm of skin: Secondary | ICD-10-CM | POA: Diagnosis not present

## 2016-04-20 DIAGNOSIS — X32XXXA Exposure to sunlight, initial encounter: Secondary | ICD-10-CM | POA: Diagnosis not present

## 2016-04-20 DIAGNOSIS — L570 Actinic keratosis: Secondary | ICD-10-CM | POA: Diagnosis not present

## 2016-04-25 DIAGNOSIS — G4733 Obstructive sleep apnea (adult) (pediatric): Secondary | ICD-10-CM | POA: Diagnosis not present

## 2016-05-12 DIAGNOSIS — G4733 Obstructive sleep apnea (adult) (pediatric): Secondary | ICD-10-CM | POA: Diagnosis not present

## 2016-05-24 ENCOUNTER — Other Ambulatory Visit: Payer: Self-pay | Admitting: Cardiovascular Disease

## 2016-05-24 DIAGNOSIS — I6523 Occlusion and stenosis of bilateral carotid arteries: Secondary | ICD-10-CM

## 2016-06-11 DIAGNOSIS — G4733 Obstructive sleep apnea (adult) (pediatric): Secondary | ICD-10-CM | POA: Diagnosis not present

## 2016-06-15 ENCOUNTER — Ambulatory Visit: Payer: Medicare Other

## 2016-06-15 DIAGNOSIS — I6523 Occlusion and stenosis of bilateral carotid arteries: Secondary | ICD-10-CM | POA: Diagnosis not present

## 2016-06-15 DIAGNOSIS — R269 Unspecified abnormalities of gait and mobility: Secondary | ICD-10-CM | POA: Diagnosis not present

## 2016-07-12 DIAGNOSIS — G4733 Obstructive sleep apnea (adult) (pediatric): Secondary | ICD-10-CM | POA: Diagnosis not present

## 2016-08-12 DIAGNOSIS — G4733 Obstructive sleep apnea (adult) (pediatric): Secondary | ICD-10-CM | POA: Diagnosis not present

## 2016-08-14 ENCOUNTER — Other Ambulatory Visit: Payer: Self-pay | Admitting: Internal Medicine

## 2016-09-01 ENCOUNTER — Encounter: Payer: Self-pay | Admitting: Family

## 2016-09-01 ENCOUNTER — Ambulatory Visit
Admission: RE | Admit: 2016-09-01 | Discharge: 2016-09-01 | Disposition: A | Discharge status: Home / Self Care | Payer: Medicare Other | Source: Ambulatory Visit | Attending: Family | Admitting: Family

## 2016-09-01 ENCOUNTER — Ambulatory Visit (INDEPENDENT_AMBULATORY_CARE_PROVIDER_SITE_OTHER): Payer: Medicare Other | Admitting: Family

## 2016-09-01 VITALS — BP 134/66 | HR 70 | Temp 97.5°F | Wt 206.2 lb

## 2016-09-01 DIAGNOSIS — Z87311 Personal history of (healed) other pathological fracture: Secondary | ICD-10-CM | POA: Insufficient documentation

## 2016-09-01 DIAGNOSIS — R109 Unspecified abdominal pain: Secondary | ICD-10-CM

## 2016-09-01 DIAGNOSIS — N400 Enlarged prostate without lower urinary tract symptoms: Secondary | ICD-10-CM | POA: Diagnosis not present

## 2016-09-01 DIAGNOSIS — K7689 Other specified diseases of liver: Secondary | ICD-10-CM | POA: Diagnosis not present

## 2016-09-01 DIAGNOSIS — R8290 Unspecified abnormal findings in urine: Secondary | ICD-10-CM | POA: Diagnosis not present

## 2016-09-01 DIAGNOSIS — I700 Atherosclerosis of aorta: Secondary | ICD-10-CM | POA: Insufficient documentation

## 2016-09-01 DIAGNOSIS — K8020 Calculus of gallbladder without cholecystitis without obstruction: Secondary | ICD-10-CM | POA: Diagnosis not present

## 2016-09-01 LAB — POCT URINALYSIS DIP (MANUAL ENTRY)
Bilirubin, UA: NEGATIVE
Blood, UA: NEGATIVE
Glucose, UA: NEGATIVE
Leukocytes, UA: NEGATIVE
Nitrite, UA: NEGATIVE
Spec Grav, UA: 1.02
Urobilinogen, UA: 1
pH, UA: 6.5

## 2016-09-01 NOTE — Progress Notes (Signed)
Pre visit review using our clinic review tool, if applicable. No additional management support is needed unless otherwise documented below in the visit note. 

## 2016-09-01 NOTE — Patient Instructions (Signed)
If image reveals no kidney stone,  I suspect muscle etiology of which you may use Icy Hot, heat, gentle stretching.  Please keep an eye on rash as also could be early shingles.   Please let me know if doesn't get better.

## 2016-09-01 NOTE — Progress Notes (Signed)
Subjective:    Patient ID: David Atkins, male    DOB: December 24, 1928, 82 y.o.   MRN: [id number]  CC: David Atkins is a 82 y.o. male who presents today for an acute visit.    HPI: Patient is here for acute visit with chief complaint of right low back pain 2 days ago. Pain hasn't moved. Describes as ache. Ibuprofen helps. Started suddenly. Works in [employer] and does a lot of reaching, pulling which he has done this past week.  He was also carrying around a heavy briefcase 2 days ago.  No nausea, vomiting, hematuria, dysuria, decreased urine. No h/o kidney stones.   H/o lumbar stenosis however pain feels 'different'.       HISTORY:  Past Medical History:  Diagnosis Date  . 3-vessel coronary artery disease 2006   [place]  . basal cell   . BPH (benign prostatic hyperplasia)   . GERD (gastroesophageal reflux disease)   . Hyperlipidemia   . Hypertension   . Lumbar stenosis 12/03/13   MRI - lumbar pain with radiation down to LE  . Myocardial infarction ([place]) 2006  . OAB (overactive bladder)   . Osteoporosis 2010   by D[facility] at [facility]  . Sleep apnea   . Spondylolisthesis of lumbar region 11/23/13   MRI Lumbar spine   Past Surgical History:  Procedure Laterality Date  . APPENDECTOMY  1951  . COLONOSCOPY    . COLONOSCOPY WITH PROPOFOL N/A 11/15/2015   Procedure: COLONOSCOPY WITH PROPOFOL;  Surgeon: Hulen Luster, MD;  Location: [facility] ENDOSCOPY;  Service: Gastroenterology;  Laterality: N/A;  . CORONARY ARTERY BYPASS GRAFT  2006   3 vessel, s/p AMI  . NASAL SEPTOPLASTY W/ TURBINOPLASTY  1981  . TONSILLECTOMY     Family History  Problem Relation Age of Onset  . Hyperlipidemia Mother   . Hypertension Mother   . Hypertension Father     Allergies: Sulfacetamide sodium and Sulfa antibiotics Current Outpatient Prescriptions on File Prior to Visit  Medication Sig Dispense Refill  . acetaminophen (TYLENOL) 325 MG tablet Take 650 mg by mouth 2 (two) times daily as needed.    Marland Kitchen alendronate  (FOSAMAX) 70 MG tablet TAKE 1 TABLET BY MOUTH WEEKLY WITH A FULL GLASS OF WATER ON AN EMPTY STOMACH 4 tablet 9  . aspirin 81 MG tablet Take 81 mg by mouth daily. Reported on 11/18/2015    . Blood Pressure Monitoring (BLOOD PRESSURE MONITOR AUTOMAT) DEVI Use daily to check blood pressure 1 Device 0  . Calcium Carbonate-Vitamin D (CALCIUM + D PO) Take 1 tablet by mouth daily.     . Cholecalciferol (VITAMIN D-3) 1000 UNITS CAPS Take 1 capsule by mouth daily.    . meclizine (ANTIVERT) 12.5 MG tablet Take 1 tablet (12.5 mg total) by mouth 3 (three) times daily as needed for dizziness. 30 tablet 5  . metoprolol tartrate (LOPRESSOR) 25 MG tablet TAKE 1/2 TABLET (25 MG TOTAL) BY MOUTH 2 (TWO) TIMES DAILY. 180 tablet 1  . metoprolol tartrate (LOPRESSOR) 25 MG tablet TAKE 1/2 TABLET (25 MG TOTAL) BY MOUTH 2 (TWO) TIMES DAILY. 60 tablet 2  . omeprazole (PRILOSEC) 20 MG capsule TAKE 1 CAPSULE (20 MG TOTAL) BY MOUTH DAILY. 90 capsule 1  . polyethylene glycol powder (GLYCOLAX/MIRALAX) powder Take 17 g by mouth once. Dissolve 1 capful of power into any liquid and drink once daily (Patient taking differently: Take 17 g by mouth 2 (two) times daily. Dissolve 1 capful of power into any liquid and  drink once daily) 500 g 0  . Saw Palmetto, Serenoa repens, (SAW PALMETTO PO) Take 1 tablet by mouth 2 (two) times daily.     . simvastatin (ZOCOR) 20 MG tablet TAKE 1 TABLET BY MOUTH AT BEDTIME 90 tablet 0  . tamsulosin (FLOMAX) 0.4 MG CAPS capsule TAKE 1 CAPSULE BY MOUTH DAILY AFTER BREAKFAST. 90 capsule 3  . tolterodine (DETROL LA) 4 MG 24 hr capsule Take 4 mg by mouth daily.     No current facility-administered medications on file prior to visit.     Social History  Substance Use Topics  . Smoking status: Former Smoker    Types: Cigarettes    Quit date: 07/10/1963  . Smokeless tobacco: Never Used  . Alcohol use No    Review of Systems  Constitutional: Negative for chills and fever.  Respiratory: Negative for  cough.   Cardiovascular: Negative for chest pain and palpitations.  Gastrointestinal: Negative for abdominal distention, abdominal pain, diarrhea, nausea and vomiting.  Genitourinary: Positive for flank pain (right). Negative for dysuria and frequency.  Musculoskeletal: Positive for back pain.      Objective:    BP 134/66   Pulse 70   Temp 97.5 F (36.4 C) (Oral)   Wt 206 lb 3.2 oz (93.5 kg)   SpO2 95%   BMI 29.59 kg/m    Physical Exam  Constitutional: He appears well-developed and well-nourished.  Cardiovascular: Regular rhythm and normal heart sounds.   Pulmonary/Chest: Effort normal and breath sounds normal. No respiratory distress. He has no wheezes. He has no rales.  Abdominal: There is no CVA tenderness.  No suprapubic tenderness.   Musculoskeletal:       Lumbar back: He exhibits normal range of motion, no tenderness, no swelling, no pain and no spasm.  Full range of motion with flexion, extension, lateral side bends. No pain, numbness, tingling elicited with single leg raise bilaterally. No rash.  Neurological: He is alert.  Skin: Skin is warm and dry. No rash noted.  Psychiatric: He has a normal mood and affect. His speech is normal and behavior is normal.  Vitals reviewed.      Assessment & Plan:   1. Right flank pain/2. Abnormal urine finding UA is positive for trace ketones and protein. Afebrile. No nausea or vomiting. Concern for renal stone. Patient has history of low back pain however he reports that this pain feels different. The pain is an ache and not colicky;  likely may be of muscular etiology from carrying heavy bag earlier this week. Pending stat CT renal study. Also discussed with patient to stay vigilant as this may be the beginning of shingles.  - POCT urinalysis dipstick - CT RENAL STONE STUDY   - Urine Culture    I have discontinued [name]'s lisinopril, docusate sodium, hyoscyamine, and Tdap. I am also having him maintain his  tolterodine, acetaminophen, aspirin, Calcium Carbonate-Vitamin D (CALCIUM + D PO), (Saw Palmetto, Serenoa repens, (SAW PALMETTO PO)), Blood Pressure Monitor Automat, Vitamin D-3, alendronate, metoprolol tartrate, polyethylene glycol powder, meclizine, tamsulosin, metoprolol tartrate, omeprazole, and simvastatin.   No orders of the defined types were placed in this encounter.   Return precautions given.   Risks, benefits, and alternatives of the medications and treatment plan prescribed today were discussed, and patient expressed understanding.   Education regarding symptom management and diagnosis given to patient on AVS.  Continue to follow with TULLO, Aris Everts, MD for routine health maintenance.   [name] and I agreed with  plan.   Mable Paris, FNP

## 2016-09-02 ENCOUNTER — Telehealth: Payer: Self-pay | Admitting: Family

## 2016-09-02 LAB — URINE CULTURE

## 2016-09-02 NOTE — Telephone Encounter (Signed)
Called patient from my personal cell and let him know CT renal results. Negative renal stone. Informed him of gallstones and atherosclerosis and advised f/u with PCP.   We discussed conservative treatment of muscle strain. Return precautions given.

## 2016-09-04 ENCOUNTER — Ambulatory Visit: Payer: Medicare Other | Admitting: Family Medicine

## 2016-09-04 ENCOUNTER — Telehealth: Payer: Self-pay | Admitting: Internal Medicine

## 2016-09-04 NOTE — Telephone Encounter (Signed)
Please call pt at [phone] FYI: pt has trouble hearing

## 2016-09-04 NOTE — Telephone Encounter (Signed)
Pt called and had a few questions. Pt is using icy hot and heating pad for a pulled muscle. Would the MRI show any other problems? MRI came back negative for kidney stones.  Would you be able to prescribed a muscle relaxer? He is still having a little bit of pain. Thank you!  Call pt @ [phone].

## 2016-09-04 NOTE — Telephone Encounter (Signed)
Called patient to discuss back pain. Tried both numbers.    Please call and triage- ensure pain NOT worsening, no fever, chills, or new symptoms.  Has had a muscle relaxant in past?   Does he problem with falls or balance?   I may prescribe muscle relaxant.

## 2016-09-04 NOTE — Telephone Encounter (Signed)
Patient wants to know if there is anything else he could do for pain? Please advise.

## 2016-09-04 NOTE — Telephone Encounter (Signed)
Left message for patient to return call back.  

## 2016-09-05 DIAGNOSIS — J301 Allergic rhinitis due to pollen: Secondary | ICD-10-CM | POA: Diagnosis not present

## 2016-09-05 DIAGNOSIS — H6123 Impacted cerumen, bilateral: Secondary | ICD-10-CM | POA: Diagnosis not present

## 2016-09-05 DIAGNOSIS — G4733 Obstructive sleep apnea (adult) (pediatric): Secondary | ICD-10-CM | POA: Diagnosis not present

## 2016-09-05 NOTE — Telephone Encounter (Signed)
Spoken to patient, he stated that the pain is almost non existent.  So patient believes he does not need any meds. hes been doing icyhot and motrin for sx.

## 2016-09-05 NOTE — Telephone Encounter (Signed)
Pt requested a call [phone]

## 2016-09-11 DIAGNOSIS — G4733 Obstructive sleep apnea (adult) (pediatric): Secondary | ICD-10-CM | POA: Diagnosis not present

## 2016-09-21 ENCOUNTER — Encounter: Payer: Self-pay | Admitting: Cardiovascular Disease

## 2016-09-21 ENCOUNTER — Ambulatory Visit (INDEPENDENT_AMBULATORY_CARE_PROVIDER_SITE_OTHER): Payer: Medicare Other | Admitting: Cardiovascular Disease

## 2016-09-21 VITALS — BP 122/78 | HR 57 | Ht 70.0 in | Wt 204.8 lb

## 2016-09-21 DIAGNOSIS — I2510 Atherosclerotic heart disease of native coronary artery without angina pectoris: Secondary | ICD-10-CM | POA: Diagnosis not present

## 2016-09-21 DIAGNOSIS — I10 Essential (primary) hypertension: Secondary | ICD-10-CM

## 2016-09-21 DIAGNOSIS — E785 Hyperlipidemia, unspecified: Secondary | ICD-10-CM

## 2016-09-21 DIAGNOSIS — Z951 Presence of aortocoronary bypass graft: Secondary | ICD-10-CM | POA: Insufficient documentation

## 2016-09-21 DIAGNOSIS — I6523 Occlusion and stenosis of bilateral carotid arteries: Secondary | ICD-10-CM

## 2016-09-21 NOTE — Patient Instructions (Signed)

## 2016-09-21 NOTE — Progress Notes (Signed)
Home Cardiology Office Note  Date:  09/21/2016   ID:  David Atkins, DOB [birth year], MRN [id number]  PCP:  [name], MD   Chief Complaint  Patient presents with  . other    6 month follow up. Meds reviewed by the pt. verbally. "doing well."     HPI:  David Atkins is a very pleasant 82 year old gentleman with a history of coronary artery disease, bypass surgery in 2006 at [facility] in [place], mild carotid arterial disease by report, history of hyperlipidemia, small smoking history in the remote past, who presents for routine followup of his coronary artery disease . He is a patient of Dr. Derrel Nip. aortic athero on CT 08/2018  In follow-up today he reports he is active, works in his workshop Does his chores, No new complaints, no chest pain concerning for angina, no significant shortness of breath on exertion  Lab work reviewed with him in detail Labs 12/2015 Total chol 124, LDL 72  Reports that he Had colonoscopy, looked good  EKG on today's visit shows normal sinus rhythm with rate 57 bpm, no significant ST or T-wave changes  Other past medical history  01/06/2014, he reported that he had general malaise, blood pressure checked showed systolic pressure greater than 200. He went to the emergency room for further evaluation. Workup there was negative including normal basic metabolic panel, negative cardiac enzymes. CT scan showed chronic microvascular changes in the deep white matter, and he was discharged home after medical management of his blood pressure. On a previous visit, his Son was concerned about various arguments he may be having with other family members at home and the stress this may be causing him.    PMH:   has a past medical history of 3-vessel coronary artery disease (2006); basal cell; BPH (benign prostatic hyperplasia); GERD (gastroesophageal reflux disease); Hyperlipidemia; Hypertension; Lumbar stenosis (12/03/13); Myocardial  infarction (2006); OAB (overactive bladder); Osteoporosis (2010); Sleep apnea; and Spondylolisthesis of lumbar region (11/23/13).  PSH:    Past Surgical History:  Procedure Laterality Date  . APPENDECTOMY  1951  . COLONOSCOPY    . COLONOSCOPY WITH PROPOFOL N/A 11/15/2015   Procedure: COLONOSCOPY WITH PROPOFOL;  Surgeon: Hulen Luster, MD;  Location: [facility] ENDOSCOPY;  Service: Gastroenterology;  Laterality: N/A;  . CORONARY ARTERY BYPASS GRAFT  2006   3 vessel, s/p AMI  . NASAL SEPTOPLASTY W/ TURBINOPLASTY  1981  . TONSILLECTOMY      Current Outpatient Prescriptions  Medication Sig Dispense Refill  . acetaminophen (TYLENOL) 325 MG tablet Take 650 mg by mouth 2 (two) times daily as needed.    Marland Kitchen alendronate (FOSAMAX) 70 MG tablet TAKE 1 TABLET BY MOUTH WEEKLY WITH A FULL GLASS OF WATER ON AN EMPTY STOMACH 4 tablet 9  . aspirin 81 MG tablet Take 81 mg by mouth daily. Reported on 11/18/2015    . Blood Pressure Monitoring (BLOOD PRESSURE MONITOR AUTOMAT) DEVI Use daily to check blood pressure 1 Device 0  . Calcium Carbonate-Vitamin D (CALCIUM + D PO) Take 1 tablet by mouth daily.     . Cholecalciferol (VITAMIN D-3) 1000 UNITS CAPS Take 1 capsule by mouth daily.    . meclizine (ANTIVERT) 12.5 MG tablet Take 1 tablet (12.5 mg total) by mouth 3 (three) times daily as needed for dizziness. 30 tablet 5  . metoprolol tartrate (LOPRESSOR) 25 MG tablet TAKE 1/2 TABLET (25 MG TOTAL) BY MOUTH 2 (TWO) TIMES DAILY. 180 tablet 1  . omeprazole (PRILOSEC) 20 MG  capsule TAKE 1 CAPSULE (20 MG TOTAL) BY MOUTH DAILY. 90 capsule 1  . polyethylene glycol powder (GLYCOLAX/MIRALAX) powder Take 17 g by mouth once. Dissolve 1 capful of power into any liquid and drink once daily (Patient taking differently: Take 17 g by mouth 2 (two) times daily. Dissolve 1 capful of power into any liquid and drink once daily) 500 g 0  . Saw Palmetto, Serenoa repens, (SAW PALMETTO PO) Take 1 tablet by mouth 2 (two) times daily.     .  simvastatin (ZOCOR) 20 MG tablet TAKE 1 TABLET BY MOUTH AT BEDTIME 90 tablet 0  . tamsulosin (FLOMAX) 0.4 MG CAPS capsule TAKE 1 CAPSULE BY MOUTH DAILY AFTER BREAKFAST. 90 capsule 3  . tolterodine (DETROL LA) 4 MG 24 hr capsule Take 4 mg by mouth daily.     No current facility-administered medications for this visit.      Allergies:   Sulfacetamide sodium and Sulfa antibiotics   Social History:  The patient  reports that he quit smoking about 53 years ago. His smoking use included Cigarettes. He has never used smokeless tobacco. He reports that he does not drink alcohol or use drugs.   Family History:   family history includes Hyperlipidemia in his mother; Hypertension in his father and mother.    Review of Systems: Review of Systems  Constitutional: Negative.   Respiratory: Negative.   Cardiovascular: Negative.   Gastrointestinal: Negative.   Musculoskeletal: Positive for back pain.  Neurological: Negative.   Psychiatric/Behavioral: Negative.   All other systems reviewed and are negative.    PHYSICAL EXAM: VS:  BP 122/78 (BP Location: Left Arm, Patient Position: Sitting, Cuff Size: Normal)   Pulse (!) 57   Ht 5\' 10"  (1.778 m)   Wt 204 lb 12 oz (92.9 kg)   BMI 29.38 kg/m  , BMI Body mass index is 29.38 kg/m. GEN: Well nourished, well developed, in no acute distress  HEENT: normal  Neck: no JVD, carotid bruits, or masses Cardiac: RRR; no murmurs, rubs, or gallops,no edema  Respiratory:  clear to auscultation bilaterally, normal work of breathing GI: soft, nontender, nondistended, + BS MS: no deformity or atrophy  Skin: warm and dry, no rash Neuro:  Strength and sensation are intact Psych: euthymic mood, full affect    Recent Labs: 10/31/2015: ALT 23; BUN 11; Creatinine, Ser 1.01; Hemoglobin 14.0; Platelets 230; Potassium 3.6; Sodium 140 12/10/2015: TSH 1.73    Lipid Panel Lab Results  Component Value Date   CHOL 124 12/10/2015   HDL 43.60 12/10/2015   LDLCALC  65 12/10/2015   TRIG 75.0 12/10/2015      Wt Readings from Last 3 Encounters:  09/21/16 204 lb 12 oz (92.9 kg)  09/01/16 206 lb 3.2 oz (93.5 kg)  12/03/15 192 lb (87.1 kg)       ASSESSMENT AND PLAN:  3-vessel coronary artery disease - Plan: EKG 12-Lead No changes to his medication regiment, currently asymptomatic No further testing at this time  Essential hypertension - Plan: EKG 12-Lead Blood pressure is well controlled on today's visit. No changes made to the medications.  Hyperlipidemia LDL goal <70 Cholesterol is at goal on the current lipid regimen. No changes to the medications were made.  Bilateral carotid artery stenosis History of mild bilateral carotid disease Cholesterol at goal  Hx of CABG   Total encounter time more than 15 minutes  Greater than 50% was spent in counseling and coordination of care with the patient   Disposition:  F/U  6 months   Orders Placed This Encounter  Procedures  . EKG 12-Lead     Signed, Esmond Plants, M.D., Ph.D. 09/21/2016  [place], [place]

## 2016-09-28 ENCOUNTER — Other Ambulatory Visit: Payer: Self-pay | Admitting: Internal Medicine

## 2016-09-29 NOTE — Telephone Encounter (Signed)
NO OV with PCP since 12/16 ok to fill alendronate?

## 2016-09-29 NOTE — Telephone Encounter (Signed)
Refill for 30 days only.  OFFICE VISIT NEEDED prior to any more refills 

## 2016-09-30 ENCOUNTER — Other Ambulatory Visit: Payer: Self-pay | Admitting: Internal Medicine

## 2016-10-12 DIAGNOSIS — G4733 Obstructive sleep apnea (adult) (pediatric): Secondary | ICD-10-CM | POA: Diagnosis not present

## 2016-10-16 ENCOUNTER — Other Ambulatory Visit: Payer: Self-pay | Admitting: Internal Medicine

## 2016-10-17 ENCOUNTER — Ambulatory Visit (INDEPENDENT_AMBULATORY_CARE_PROVIDER_SITE_OTHER): Payer: Medicare Other | Admitting: Internal Medicine

## 2016-10-17 ENCOUNTER — Encounter: Payer: Self-pay | Admitting: Internal Medicine

## 2016-10-17 VITALS — BP 146/84 | HR 70 | Temp 97.4°F | Resp 12 | Ht 70.0 in | Wt 202.0 lb

## 2016-10-17 DIAGNOSIS — E663 Overweight: Secondary | ICD-10-CM

## 2016-10-17 DIAGNOSIS — Z0000 Encounter for general adult medical examination without abnormal findings: Secondary | ICD-10-CM

## 2016-10-17 DIAGNOSIS — M25551 Pain in right hip: Secondary | ICD-10-CM

## 2016-10-17 DIAGNOSIS — I10 Essential (primary) hypertension: Secondary | ICD-10-CM

## 2016-10-17 DIAGNOSIS — G4733 Obstructive sleep apnea (adult) (pediatric): Secondary | ICD-10-CM

## 2016-10-17 DIAGNOSIS — M810 Age-related osteoporosis without current pathological fracture: Secondary | ICD-10-CM | POA: Diagnosis not present

## 2016-10-17 DIAGNOSIS — E559 Vitamin D deficiency, unspecified: Secondary | ICD-10-CM

## 2016-10-17 DIAGNOSIS — E785 Hyperlipidemia, unspecified: Secondary | ICD-10-CM | POA: Diagnosis not present

## 2016-10-17 DIAGNOSIS — R7302 Impaired glucose tolerance (oral): Secondary | ICD-10-CM

## 2016-10-17 DIAGNOSIS — R5383 Other fatigue: Secondary | ICD-10-CM | POA: Diagnosis not present

## 2016-10-17 DIAGNOSIS — Z9989 Dependence on other enabling machines and devices: Secondary | ICD-10-CM

## 2016-10-17 MED ORDER — TETANUS-DIPHTH-ACELL PERTUSSIS 5-2.5-18.5 LF-MCG/0.5 IM SUSP: 0.5000 mL | Freq: Once | INTRAMUSCULAR | 0 refills | Status: AC

## 2016-10-17 NOTE — Progress Notes (Signed)
Pre-visit discussion using our clinic review tool. No additional management support is needed unless otherwise documented below in the visit note.  

## 2016-10-17 NOTE — Progress Notes (Signed)
Patient ID: David Atkins, male    DOB: [birth year]  Age: 82 y.o. MRN: [id number]  The patient is here for his annual preventive physical  examination and management of other chronic and acute problems.    Had the annual flu vaccine at the [place] [facility] Sept 29 no change has not had TDAP Wears hearing aids  Sees dermatology annually  3 years ago had a basal cell  Carotid ultrasounds have been  repeated,  No change   No falls in the last year balance is good  checks BP weekly on  Sunday,  Uses prn lisinopril for systolic pressure  > [id number]  No trouble with bowels or bladder.  Uses daily BFL Using CPAP .  Sleep study was repeated, new machine given. . he is wearing his CPAP every night a minimum of 6 hours per night and notes improved daytime wakefulness and decreased fatigue     The risk factors are reflected in the social history.  The roster of all physicians providing medical care to patient - is listed in the Snapshot section of the chart.  Activities of daily living:  The patient is 100% independent in all ADLs: dressing, toileting, feeding as well as independent mobility  Home safety : The patient has smoke detectors in the home. They wear seatbelts.  There are no firearms at home. There is no violence in the home.   There is no risks for hepatitis, STDs or HIV. There is no   history of blood transfusion. They have no travel history to infectious disease endemic areas of the world.  The patient has seen their dentist in the last six month.  They have seen their eye doctor in the last year. They wear hearing aids.  They do not  have excessive sun exposure. Discussed the need for sun protection: hats, long sleeves and use of sunscreen if there is significant sun exposure.   Diet: the importance of a healthy diet is discussed. Has been eating less due to weight gain.  Concerned about his increasing abdominal girth.  Diet reviewed:  Lots of carbs for daily breakfast. Eats eggs only  once a week. Lunch is a hot dog or ham sandwich. Dinner is a Radio producer,  Or tacos,   Or a bowl of soup with noodles  .  The benefits of regular aerobic exercise were discussed.  He is not very active.  Eats breakfast  Spends several hours woodworking every day in his shop.  Eats a  light lunch,  Dinner is at 600 pm. Spends evenings listening to [facility] and old classic country music. Not walking daily unless daughter is visiting (not often) . Only does a set of exercises on sundays     Depression screen: there are no signs or vegative symptoms of depression- irritability, change in appetite, anhedonia, sadness/tearfullness.  Cognitive assessment: the patient manages all their financial and personal affairs and is actively engaged. They could relate day,date,year and events; recalled 2/3 objects at 3 minutes; performed clock-face test normally.  The following portions of the patient's history were reviewed and updated as appropriate: allergies, current medications, past family history, past medical history,  past surgical history, past social history  and problem list.  Visual acuity was not assessed per patient preference since she has regular follow up with her ophthalmologist. Hearing and body mass index were assessed and reviewed.   During the course of the visit the patient was educated and counseled about appropriate screening and  preventive services including : fall prevention , diabetes screening, nutrition counseling, colorectal cancer screening, and recommended immunizations.    CC: The primary encounter diagnosis was Age-related osteoporosis without current pathological fracture. Diagnoses of Essential hypertension, Hyperlipidemia LDL goal <70, Impaired glucose tolerance, Vitamin D deficiency, Fatigue, unspecified type, Overweight, Right hip pain, Encounter for preventive health examination, and OSA on CPAP were also pertinent to this visit.  L4 -5 bulging disk managed with stretching  . Has been having periodic right lateral thigh pain 1-2 times per week , "is it sciatica?"    History Darran has a past medical history of 3-vessel coronary artery disease (2006); basal cell; BPH (benign prostatic hyperplasia); GERD (gastroesophageal reflux disease); Hyperlipidemia; Hypertension; Lumbar stenosis (12/03/13); Myocardial infarction (2006); OAB (overactive bladder); Osteoporosis (2010); Sleep apnea; and Spondylolisthesis of lumbar region (11/23/13).   He has a past surgical history that includes Appendectomy (1951); Nasal septoplasty w/ turbinoplasty (1981); Coronary artery bypass graft (2006); Colonoscopy; Tonsillectomy; and Colonoscopy with propofol (N/A, 11/15/2015).   His family history includes Hyperlipidemia in his mother; Hypertension in his father and mother.He reports that he quit smoking about 53 years ago. His smoking use included Cigarettes. He has never used smokeless tobacco. He reports that he does not drink alcohol or use drugs.  Outpatient Medications Prior to Visit  Medication Sig Dispense Refill  . acetaminophen (TYLENOL) 325 MG tablet Take 650 mg by mouth 2 (two) times daily as needed.    Marland Kitchen aspirin 81 MG tablet Take 81 mg by mouth daily. Reported on 11/18/2015    . Blood Pressure Monitoring (BLOOD PRESSURE MONITOR AUTOMAT) DEVI Use daily to check blood pressure 1 Device 0  . Calcium Carbonate-Vitamin D (CALCIUM + D PO) Take 1 tablet by mouth daily.     . Cholecalciferol (VITAMIN D-3) 1000 UNITS CAPS Take 1 capsule by mouth daily.    . meclizine (ANTIVERT) 12.5 MG tablet Take 1 tablet (12.5 mg total) by mouth 3 (three) times daily as needed for dizziness. 30 tablet 5  . metoprolol tartrate (LOPRESSOR) 25 MG tablet TAKE 1/2 TABLET (25 MG TOTAL) BY MOUTH 2 (TWO) TIMES DAILY. 180 tablet 1  . omeprazole (PRILOSEC) 20 MG capsule TAKE 1 CAPSULE (20 MG TOTAL) BY MOUTH DAILY. 90 capsule 1  . polyethylene glycol powder (GLYCOLAX/MIRALAX) powder Take 17 g by mouth once.  Dissolve 1 capful of power into any liquid and drink once daily (Patient taking differently: Take 17 g by mouth 2 (two) times daily. Dissolve 1 capful of power into any liquid and drink once daily) 500 g 0  . Saw Palmetto, Serenoa repens, (SAW PALMETTO PO) Take 1 tablet by mouth 2 (two) times daily.     . simvastatin (ZOCOR) 20 MG tablet TAKE 1 TABLET BY MOUTH AT BEDTIME 90 tablet 0  . tamsulosin (FLOMAX) 0.4 MG CAPS capsule TAKE 1 CAPSULE BY MOUTH DAILY AFTER BREAKFAST. 90 capsule 3  . tolterodine (DETROL LA) 4 MG 24 hr capsule Take 4 mg by mouth daily.    Marland Kitchen alendronate (FOSAMAX) 70 MG tablet TAKE 1 TABLET BY MOUTH WEEKLY WITH A FULL GLASS OF WATER ON AN EMPTY STOMACH 4 tablet 0  . metoprolol tartrate (LOPRESSOR) 25 MG tablet TAKE 1/2 TABLET (25 MG TOTAL) BY MOUTH 2 (TWO) TIMES DAILY. 60 tablet 2   No facility-administered medications prior to visit.     Review of Systems  Patient denies headache, fevers, malaise, unintentional weight loss, skin rash, eye pain, sinus congestion and sinus pain, sore throat, dysphagia,  hemoptysis , cough, dyspnea, wheezing, chest pain, palpitations, orthopnea, edema, abdominal pain, nausea, melena, diarrhea, constipation, flank pain, dysuria, hematuria, urinary  Frequency, nocturia, numbness, tingling, seizures,  Focal weakness, Loss of consciousness,  Tremor, insomnia, depression, anxiety, and suicidal ideation.     Objective:  BP (!) 146/84   Pulse 70   Temp 97.4 F (36.3 C) (Oral)   Resp 12   Ht 5\' 10"  (1.778 m)   Wt 202 lb (91.6 kg)   SpO2 96%   BMI 28.98 kg/m   Physical Exam   General appearance: alert, cooperative and appears stated age Ears: normal TM's and external ear canals both ears Throat: lips, mucosa, and tongue normal; teeth and gums normal Neck: no adenopathy, no carotid bruit, supple, symmetrical, trachea midline and thyroid not enlarged, symmetric, no tenderness/mass/nodules Back: symmetric, no curvature. ROM normal. No CVA  tenderness. Lungs: clear to auscultation bilaterally Heart: regular rate and rhythm, S1, S2 normal, no murmur, click, rub or gallop Abdomen: soft, non-tender; abdominal girth increased without fluid wave,  masses, or  organomegaly Pulses: 2+ and symmetric Skin: Skin color, texture, turgor normal. No rashes or lesions Lymph nodes: Cervical, supraclavicular, and axillary nodes normal.    Assessment & Plan:   Problem List Items Addressed This Visit    Hypertension    Reasonable Well controlled on current regimen. Renal function stable, no changes today.  Lab Results  Component Value Date   CREATININE 0.98 10/17/2016   Lab Results  Component Value Date   NA 142 10/17/2016   K 4.3 10/17/2016   CL 105 10/17/2016   CO2 30 10/17/2016         Hyperlipidemia LDL goal <70    With CAD and PAD.  LDL and triglycerides are at goal on current medications. He has no side effects and liver enzymes are normal. No changes today  Lab Results  Component Value Date   CHOL 124 12/10/2015   HDL 43.60 12/10/2015   LDLCALC 65 12/10/2015   LDLDIRECT 67.0 10/17/2016   TRIG 75.0 12/10/2015   CHOLHDL 3 12/10/2015   Lab Results  Component Value Date   ALT 29 10/17/2016   AST 27 10/17/2016   ALKPHOS 67 10/17/2016   BILITOT 0.5 10/17/2016           Relevant Orders   LDL cholesterol, direct (Completed)   Impaired glucose tolerance    I have addressed  Diet and recommended wt loss of 10% of body weight over the next 6 months using a low fat, low starch, high protein  fruit/vegetable based Mediterranean diet and 30 minutes of aerobic exercise a minimum of 5 days per week.   Lab Results  Component Value Date   HGBA1C 5.8 10/17/2016         Relevant Orders   Hemoglobin A1c (Completed)   Encounter for preventive health examination    Annual comprehensive preventive exam was done as well as an evaluation and management of chronic conditions .  During the course of the visit the patient  was educated and counseled about appropriate screening and preventive services including :  diabetes screening, lipid analysis , screening for dementia and falls, and recommended immunizations.  Printed recommendations for health maintenance screenings was given      Right hip pain    Current complaint dose not suggest sciatica.. but may indicate DJD vs muscle strain . He has good ROM       OSA on CPAP    Diagnosed by prior sleep  study. Patient is using CPAP every night a minimum of 6 hours per night but has not had a study in over 5 years.  Advised to have one but he has deferred for now.       Overweight    I have addressed  BMI and recommended wt loss of 10% of body weight over the next 6 months using a low glycemic index diet and regular exercise a minimum of 5 days per week.        Osteoporosis - Primary    Other Visit Diagnoses    Vitamin D deficiency       Relevant Orders   VITAMIN D 25 Hydroxy (Vit-D Deficiency, Fractures) (Completed)   Fatigue, unspecified type       Relevant Orders   Comprehensive metabolic panel (Completed)   CBC with Differential/Platelet (Completed)   TSH (Completed)      I have discontinued Mr. Prust's alendronate. I am also having him start on Tdap. Additionally, I am having him maintain his tolterodine, acetaminophen, aspirin, Calcium Carbonate-Vitamin D (CALCIUM + D PO), (Saw Palmetto, Serenoa repens, (SAW PALMETTO PO)), Blood Pressure Monitor Automat, Vitamin D-3, metoprolol tartrate, polyethylene glycol powder, meclizine, tamsulosin, simvastatin, and omeprazole.  Meds ordered this encounter  Medications  . Tdap (BOOSTRIX) 5-2.5-18.5 LF-MCG/0.5 injection    Sig: Inject 0.5 mLs into the muscle once.    Dispense:  0.5 mL    Refill:  0    Medications Discontinued During This Encounter  Medication Reason  . alendronate (FOSAMAX) 70 MG tablet Completed Course  . metoprolol tartrate (LOPRESSOR) 25 MG tablet Duplicate    Follow-up: No  Follow-up on file.   [name], MD

## 2016-10-17 NOTE — Patient Instructions (Addendum)
You had your annual  preventive exam today,  And your wellness exam will be set up soon.   You still need your tetanus-diptheria-pertussis vaccine (TDaP) but you can get it for less $$$ at a local pharmacy with the script I have provided you.   Your right thigh pain is not sciatica  Your diet is very rich in carbohydrates,  And because you are not exercising,  You are gaining weight.,  I am screening your for diabetes today  Health Maintenance, Male A healthy lifestyle and preventative care can promote health and wellness.  Maintain regular health, dental, and eye exams.  Eat a healthy diet. Foods like vegetables, fruits, whole grains, low-fat dairy products, and lean protein foods contain the nutrients you need and are low in calories. Decrease your intake of foods high in solid fats, added sugars, and salt. Get information about a proper diet from your health care provider, if necessary.  Regular physical exercise is one of the most important things you can do for your health. Most adults should get at least 150 minutes of moderate-intensity exercise (any activity that increases your heart rate and causes you to sweat) each week. In addition, most adults need muscle-strengthening exercises on 2 or more days a week.   Maintain a healthy weight. The body mass index (BMI) is a screening tool to identify possible weight problems. It provides an estimate of body fat based on height and weight. Your health care provider can find your BMI and can help you achieve or maintain a healthy weight. For males 20 years and older:  A BMI below 18.5 is considered underweight.  A BMI of 18.5 to 24.9 is normal.  A BMI of 25 to 29.9 is considered overweight.  A BMI of 30 and above is considered obese.  Maintain normal blood lipids and cholesterol by exercising and minimizing your intake of saturated fat. Eat a balanced diet with plenty of fruits and vegetables. Blood tests for lipids and cholesterol should  begin at age 23 and be repeated every 5 years. If your lipid or cholesterol levels are high, you are over age 63, or you are at high risk for heart disease, you may need your cholesterol levels checked more frequently.Ongoing high lipid and cholesterol levels should be treated with medicines if diet and exercise are not working.  If you smoke, find out from your health care provider how to quit. If you do not use tobacco, do not start.  Lung cancer screening is recommended for adults aged 68-80 years who are at high risk for developing lung cancer because of a history of smoking. A yearly low-dose CT scan of the lungs is recommended for people who have at least a 30-pack-year history of smoking and are current smokers or have quit within the past 15 years. A pack year of smoking is smoking an average of 1 pack of cigarettes a day for 1 year (for example, a 30-pack-year history of smoking could mean smoking 1 pack a day for 30 years or 2 packs a day for 15 years). Yearly screening should continue until the smoker has stopped smoking for at least 15 years. Yearly screening should be stopped for people who develop a health problem that would prevent them from having lung cancer treatment.  If you choose to drink alcohol, do not have more than 2 drinks per day. One drink is considered to be 12 oz (360 mL) of beer, 5 oz (150 mL) of wine, or 1.5 oz (  45 mL) of liquor.  Avoid the use of street drugs. Do not share needles with anyone. Ask for help if you need support or instructions about stopping the use of drugs.  High blood pressure causes heart disease and increases the risk of stroke. High blood pressure is more likely to develop in:  People who have blood pressure in the end of the normal range (100-139/85-89 mm Hg).  People who are overweight or obese.  People who are African American.  If you are 81-41 years of age, have your blood pressure checked every 3-5 years. If you are 36 years of age or  older, have your blood pressure checked every year. You should have your blood pressure measured twice-once when you are at a hospital or clinic, and once when you are not at a hospital or clinic. Record the average of the two measurements. To check your blood pressure when you are not at a hospital or clinic, you can use:  An automated blood pressure machine at a pharmacy.  A home blood pressure monitor.  If you are 69-46 years old, ask your health care provider if you should take aspirin to prevent heart disease.  Diabetes screening involves taking a blood sample to check your fasting blood sugar level. This should be done once every 3 years after age 7 if you are at a normal weight and without risk factors for diabetes. Testing should be considered at a younger age or be carried out more frequently if you are overweight and have at least 1 risk factor for diabetes.  Colorectal cancer can be detected and often prevented. Most routine colorectal cancer screening begins at the age of 40 and continues through age 55. However, your health care provider may recommend screening at an earlier age if you have risk factors for colon cancer. On a yearly basis, your health care provider may provide home test kits to check for hidden blood in the stool. A small camera at the end of a tube may be used to directly examine the colon (sigmoidoscopy or colonoscopy) to detect the earliest forms of colorectal cancer. Talk to your health care provider about this at age 54 when routine screening begins. A direct exam of the colon should be repeated every 5-10 years through age 74, unless early forms of precancerous polyps or small growths are found.  People who are at an increased risk for hepatitis B should be screened for this virus. You are considered at high risk for hepatitis B if:  You were born in a country where hepatitis B occurs often. Talk with your health care provider about which countries are considered  high risk.  Your parents were born in a high-risk country and you have not received a shot to protect against hepatitis B (hepatitis B vaccine).  You have HIV or AIDS.  You use needles to inject street drugs.  You live with, or have sex with, someone who has hepatitis B.  You are a man who has sex with other men (MSM).  You get hemodialysis treatment.  You take certain medicines for conditions like cancer, organ transplantation, and autoimmune conditions.  Hepatitis C blood testing is recommended for all people born from [birth year] through 1963 and any individual with known risk factors for hepatitis C.  Healthy men should no longer receive prostate-specific antigen (PSA) blood tests as part of routine cancer screening. Talk to your health care provider about prostate cancer screening.  Testicular cancer screening is not recommended for  adolescents or adult males who have no symptoms. Screening includes self-exam, a health care provider exam, and other screening tests. Consult with your health care provider about any symptoms you have or any concerns you have about testicular cancer.  Practice safe sex. Use condoms and avoid high-risk sexual practices to reduce the spread of sexually transmitted infections (STIs).  You should be screened for STIs, including gonorrhea and chlamydia if:  You are sexually active and are younger than 24 years.  You are older than 24 years, and your health care provider tells you that you are at risk for this type of infection.  Your sexual activity has changed since you were last screened, and you are at an increased risk for chlamydia or gonorrhea. Ask your health care provider if you are at risk.  If you are at risk of being infected with HIV, it is recommended that you take a prescription medicine daily to prevent HIV infection. This is called pre-exposure prophylaxis (PrEP). You are considered at risk if:  You are a man who has sex with other men  (MSM).  You are a heterosexual man who is sexually active with multiple partners.  You take drugs by injection.  You are sexually active with a partner who has HIV.  Talk with your health care provider about whether you are at high risk of being infected with HIV. If you choose to begin PrEP, you should first be tested for HIV. You should then be tested every 3 months for as long as you are taking PrEP.  Use sunscreen. Apply sunscreen liberally and repeatedly throughout the day. You should seek shade when your shadow is shorter than you. Protect yourself by wearing long sleeves, pants, a wide-brimmed hat, and sunglasses year round whenever you are outdoors.  Tell your health care provider of new moles or changes in moles, especially if there is a change in shape or color. Also, tell your health care provider if a mole is larger than the size of a pencil eraser.  A one-time screening for abdominal aortic aneurysm (AAA) and surgical repair of large AAAs by ultrasound is recommended for men aged 75-75 years who are current or former smokers.  Stay current with your vaccines (immunizations). This information is not intended to replace advice given to you by your health care provider. Make sure you discuss any questions you have with your health care provider. Document Released: 05/18/2008 Document Revised: 12/11/2014 Document Reviewed: 08/24/2015 Elsevier Interactive Patient Education  2017 [name].

## 2016-10-18 LAB — COMPREHENSIVE METABOLIC PANEL
ALT: 29 U/L (ref 0–53)
AST: 27 U/L (ref 0–37)
Albumin: 4 g/dL (ref 3.5–5.2)
Alkaline Phosphatase: 67 U/L (ref 39–117)
BUN: 16 mg/dL (ref 6–23)
CO2: 30 mEq/L (ref 19–32)
Calcium: 9.5 mg/dL (ref 8.4–10.5)
Chloride: 105 mEq/L (ref 96–112)
Creatinine, Ser: 0.98 mg/dL (ref 0.40–1.50)
GFR: 77.18 mL/min (ref >60.00)
Glucose, Bld: 101 mg/dL — ABNORMAL HIGH (ref 70–99)
Potassium: 4.3 mEq/L (ref 3.5–5.1)
Sodium: 142 mEq/L (ref 135–145)
Total Bilirubin: 0.5 mg/dL (ref 0.2–1.2)
Total Protein: 6.9 g/dL (ref 6.0–8.3)

## 2016-10-18 LAB — CBC WITH DIFFERENTIAL/PLATELET
Basophils Absolute: 0.1 10*3/uL (ref 0.0–0.1)
Basophils Relative: 0.7 % (ref 0.0–3.0)
Eosinophils Absolute: 0.3 10*3/uL (ref 0.0–0.7)
Eosinophils Relative: 3.5 % (ref 0.0–5.0)
HCT: 46.3 % (ref 39.0–52.0)
Hemoglobin: 15.5 g/dL (ref 13.0–17.0)
Lymphocytes Relative: 28.1 % (ref 12.0–46.0)
Lymphs Abs: 2.2 10*3/uL (ref 0.7–4.0)
MCHC: 33.5 g/dL (ref 30.0–36.0)
MCV: 95.7 fl (ref 78.0–100.0)
Monocytes Absolute: 0.5 10*3/uL (ref 0.1–1.0)
Monocytes Relative: 7 % (ref 3.0–12.0)
Neutro Abs: 4.7 10*3/uL (ref 1.4–7.7)
Neutrophils Relative %: 60.7 % (ref 43.0–77.0)
Platelets: 208 10*3/uL (ref 150.0–400.0)
RBC: 4.83 Mil/uL (ref 4.22–5.81)
RDW: 13.2 % (ref 11.5–15.5)
WBC: 7.8 10*3/uL (ref 4.0–10.5)

## 2016-10-18 LAB — HEMOGLOBIN A1C: Hgb A1c MFr Bld: 5.8 % (ref 4.6–6.5)

## 2016-10-18 LAB — VITAMIN D 25 HYDROXY (VIT D DEFICIENCY, FRACTURES): VITD: 45.8 ng/mL (ref 30.00–100.00)

## 2016-10-18 LAB — LDL CHOLESTEROL, DIRECT: Direct LDL: 67 mg/dL

## 2016-10-18 LAB — TSH: TSH: 1.93 u[IU]/mL (ref 0.35–4.50)

## 2016-10-19 NOTE — Assessment & Plan Note (Addendum)
I have addressed  BMI and recommended wt loss of 10% of body weight over the next 6 months using a low glycemic index diet and regular exercise a minimum of 5 days per week.   

## 2016-10-19 NOTE — Assessment & Plan Note (Signed)
I have addressed  Diet and recommended wt loss of 10% of body weight over the next 6 months using a low fat, low starch, high protein  fruit/vegetable based Mediterranean diet and 30 minutes of aerobic exercise a minimum of 5 days per week.   Lab Results  Component Value Date   HGBA1C 5.8 10/17/2016

## 2016-10-19 NOTE — Assessment & Plan Note (Signed)
With CAD and PAD.  LDL and triglycerides are at goal on current medications. He has no side effects and liver enzymes are normal. No changes today  Lab Results  Component Value Date   CHOL 124 12/10/2015   HDL 43.60 12/10/2015   LDLCALC 65 12/10/2015   LDLDIRECT 67.0 10/17/2016   TRIG 75.0 12/10/2015   CHOLHDL 3 12/10/2015   Lab Results  Component Value Date   ALT 29 10/17/2016   AST 27 10/17/2016   ALKPHOS 67 10/17/2016   BILITOT 0.5 10/17/2016

## 2016-10-19 NOTE — Assessment & Plan Note (Signed)
Diagnosed by prior sleep study. Patient is using CPAP every night a minimum of 6 hours per night but has not had a study in over 5 years.  Advised to have one but he has deferred for now.

## 2016-10-19 NOTE — Assessment & Plan Note (Addendum)
Current complaint dose not suggest sciatica.. but may indicate DJD vs muscle strain . He has good ROM

## 2016-10-19 NOTE — Assessment & Plan Note (Signed)
Annual comprehensive preventive exam was done as well as an evaluation and management of chronic conditions .  During the course of the visit the patient was educated and counseled about appropriate screening and preventive services including :  diabetes screening, lipid analysis , screening for dementia and falls, and recommended immunizations.  Printed recommendations for health maintenance screenings was given

## 2016-10-19 NOTE — Assessment & Plan Note (Signed)
Reasonable Well controlled on current regimen. Renal function stable, no changes today.  Lab Results  Component Value Date   CREATININE 0.98 10/17/2016   Lab Results  Component Value Date   NA 142 10/17/2016   K 4.3 10/17/2016   CL 105 10/17/2016   CO2 30 10/17/2016

## 2016-10-20 ENCOUNTER — Telehealth: Payer: Self-pay | Admitting: Internal Medicine

## 2016-10-20 NOTE — Telephone Encounter (Signed)
Pt called requesting lab results. Thank you!  Call pt @ [phone]

## 2016-10-20 NOTE — Telephone Encounter (Signed)
Patient tnoified of results

## 2016-11-06 ENCOUNTER — Ambulatory Visit (INDEPENDENT_AMBULATORY_CARE_PROVIDER_SITE_OTHER): Payer: Medicare Other

## 2016-11-06 VITALS — BP 130/70 | HR 68 | Temp 97.6°F | Resp 14 | Ht 68.0 in | Wt 201.4 lb

## 2016-11-06 DIAGNOSIS — Z0000 Encounter for general adult medical examination without abnormal findings: Secondary | ICD-10-CM | POA: Diagnosis not present

## 2016-11-06 NOTE — Patient Instructions (Addendum)
David Atkins , Thank you for taking time to come for your Medicare Wellness Visit. I appreciate your ongoing commitment to your health goals. Please review the following plan we discussed and let me know if I can assist you in the future.   FOLLOW UP WITH DR. Derrel Nip AS NEEDED.  These are the goals we discussed: Goals    . Increase physical activity          Add 1 more day and 5 more minutes to bed/chair/standing exercises.  Increase as tolerated.       This is a list of the screening recommended for you and due dates:  Health Maintenance  Topic Date Due  . Tetanus Vaccine  11/05/2017*  . Flu Shot  Completed  . Shingles Vaccine  Completed  . Pneumonia vaccines  Completed  *Topic was postponed. The date shown is not the original due date.      Fall Prevention in the Home Introduction Falls can cause injuries. They can happen to people of all ages. There are many things you can do to make your home safe and to help prevent falls. What can I do on the outside of my home?  Regularly fix the edges of walkways and driveways and fix any cracks.  Remove anything that might make you trip as you walk through a door, such as a raised step or threshold.  Trim any bushes or trees on the path to your home.  Use bright outdoor lighting.  Clear any walking paths of anything that might make someone trip, such as rocks or tools.  Regularly check to see if handrails are loose or broken. Make sure that both sides of any steps have handrails.  Any raised decks and porches should have guardrails on the edges.  Have any leaves, snow, or ice cleared regularly.  Use sand or salt on walking paths during winter.  Clean up any spills in your garage right away. This includes oil or grease spills. What can I do in the bathroom?  Use night lights.  Install grab bars by the toilet and in the tub and shower. Do not use towel bars as grab bars.  Use non-skid mats or decals in the tub or  shower.  If you need to sit down in the shower, use a plastic, non-slip stool.  Keep the floor dry. Clean up any water that spills on the floor as soon as it happens.  Remove soap buildup in the tub or shower regularly.  Attach bath mats securely with double-sided non-slip rug tape.  Do not have throw rugs and other things on the floor that can make you trip. What can I do in the bedroom?  Use night lights.  Make sure that you have a light by your bed that is easy to reach.  Do not use any sheets or blankets that are too big for your bed. They should not hang down onto the floor.  Have a firm chair that has side arms. You can use this for support while you get dressed.  Do not have throw rugs and other things on the floor that can make you trip. What can I do in the kitchen?  Clean up any spills right away.  Avoid walking on wet floors.  Keep items that you use a lot in easy-to-reach places.  If you need to reach something above you, use a strong step stool that has a grab bar.  Keep electrical cords out of the way.  Do not use floor polish or wax that makes floors slippery. If you must use wax, use non-skid floor wax.  Do not have throw rugs and other things on the floor that can make you trip. What can I do with my stairs?  Do not leave any items on the stairs.  Make sure that there are handrails on both sides of the stairs and use them. Fix handrails that are broken or loose. Make sure that handrails are as long as the stairways.  Check any carpeting to make sure that it is firmly attached to the stairs. Fix any carpet that is loose or worn.  Avoid having throw rugs at the top or bottom of the stairs. If you do have throw rugs, attach them to the floor with carpet tape.  Make sure that you have a light switch at the top of the stairs and the bottom of the stairs. If you do not have them, ask someone to add them for you. What else can I do to help prevent  falls?  Wear shoes that:  Do not have high heels.  Have rubber bottoms.  Are comfortable and fit you well.  Are closed at the toe. Do not wear sandals.  If you use a stepladder:  Make sure that it is fully opened. Do not climb a closed stepladder.  Make sure that both sides of the stepladder are locked into place.  Ask someone to hold it for you, if possible.  Clearly mark and make sure that you can see:  Any grab bars or handrails.  First and last steps.  Where the edge of each step is.  Use tools that help you move around (mobility aids) if they are needed. These include:  Canes.  Walkers.  Scooters.  Crutches.  Turn on the lights when you go into a dark area. Replace any light bulbs as soon as they burn out.  Set up your furniture so you have a clear path. Avoid moving your furniture around.  If any of your floors are uneven, fix them.  If there are any pets around you, be aware of where they are.  Review your medicines with your doctor. Some medicines can make you feel dizzy. This can increase your chance of falling. Ask your doctor what other things that you can do to help prevent falls. This information is not intended to replace advice given to you by your health care provider. Make sure you discuss any questions you have with your health care provider. Document Released: 09/16/2009 Document Revised: 04/27/2016 Document Reviewed: 12/25/2014  2017 Elsevier

## 2016-11-06 NOTE — Progress Notes (Signed)
Subjective:   David Atkins is a 82 y.o. male who presents for Medicare Annual/Subsequent preventive examination.  Review of Systems:  No ROS.  Medicare Wellness Visit.  Cardiac Risk Factors include: advanced age (>3men, >65 women);hypertension;male gender     Objective:    Vitals: BP 130/70 (BP Location: Right Arm, Patient Position: Sitting, Cuff Size: Normal)   Pulse 68   Temp 97.6 F (36.4 C) (Oral)   Resp 14   Ht 5\' 8"  (1.727 m)   Wt 201 lb 6.4 oz (91.4 kg)   SpO2 97%   BMI 30.62 kg/m   Body mass index is 30.62 kg/m.  Tobacco History  Smoking Status  . Former Smoker  . Types: Cigarettes  . Quit date: 07/10/1963  Smokeless Tobacco  . Never Used     Counseling given: Not Answered   Past Medical History:  Diagnosis Date  . 3-vessel coronary artery disease 2006   [place]  . basal cell   . BPH (benign prostatic hyperplasia)   . GERD (gastroesophageal reflux disease)   . Hyperlipidemia   . Hypertension   . Lumbar stenosis 12/03/13   MRI - lumbar pain with radiation down to LE  . Myocardial infarction 2006  . OAB (overactive bladder)   . Osteoporosis 2010   by DEXA at [facility]  . Sleep apnea   . Spondylolisthesis of lumbar region 11/23/13   MRI Lumbar spine   Past Surgical History:  Procedure Laterality Date  . APPENDECTOMY  1951  . COLONOSCOPY    . COLONOSCOPY WITH PROPOFOL N/A 11/15/2015   Procedure: COLONOSCOPY WITH PROPOFOL;  Surgeon: Hulen Luster, MD;  Location: [facility] ENDOSCOPY;  Service: Gastroenterology;  Laterality: N/A;  . CORONARY ARTERY BYPASS GRAFT  2006   3 vessel, s/p AMI  . NASAL SEPTOPLASTY W/ TURBINOPLASTY  1981  . TONSILLECTOMY     Family History  Problem Relation Age of Onset  . Hyperlipidemia Mother   . Hypertension Mother   . Hypertension Father    History  Sexual Activity  . Sexual activity: Not Currently    Outpatient Encounter Prescriptions as of 11/06/2016  Medication Sig  . acetaminophen (TYLENOL) 325 MG tablet Take 650 mg  by mouth 2 (two) times daily as needed.  Marland Kitchen aspirin 81 MG tablet Take 81 mg by mouth daily. Reported on 11/18/2015  . Blood Pressure Monitoring (BLOOD PRESSURE MONITOR AUTOMAT) DEVI Use daily to check blood pressure  . Calcium Carbonate-Vitamin D (CALCIUM + D PO) Take 1 tablet by mouth daily.   . Cholecalciferol (VITAMIN D-3) 1000 UNITS CAPS Take 1 capsule by mouth daily.  . meclizine (ANTIVERT) 12.5 MG tablet Take 1 tablet (12.5 mg total) by mouth 3 (three) times daily as needed for dizziness.  . metoprolol tartrate (LOPRESSOR) 25 MG tablet TAKE 1/2 TABLET (25 MG TOTAL) BY MOUTH 2 (TWO) TIMES DAILY.  Marland Kitchen omeprazole (PRILOSEC) 20 MG capsule TAKE 1 CAPSULE (20 MG TOTAL) BY MOUTH DAILY.  Marland Kitchen polyethylene glycol powder (GLYCOLAX/MIRALAX) powder Take 17 g by mouth once. Dissolve 1 capful of power into any liquid and drink once daily (Patient taking differently: Take 17 g by mouth 2 (two) times daily. Dissolve 1 capful of power into any liquid and drink once daily)  . Saw Palmetto, Serenoa repens, (SAW PALMETTO PO) Take 1 tablet by mouth 2 (two) times daily.   . simvastatin (ZOCOR) 20 MG tablet TAKE 1 TABLET BY MOUTH AT BEDTIME  . tamsulosin (FLOMAX) 0.4 MG CAPS capsule TAKE 1 CAPSULE BY  MOUTH DAILY AFTER BREAKFAST.  Marland Kitchen tolterodine (DETROL LA) 4 MG 24 hr capsule Take 4 mg by mouth daily.   No facility-administered encounter medications on file as of 11/06/2016.     Activities of Daily Living In your present state of health, do you have any difficulty performing the following activities: 11/06/2016  Hearing? Y  Vision? N  Difficulty concentrating or making decisions? N  Walking or climbing stairs? N  Dressing or bathing? N  Doing errands, shopping? N  Preparing Food and eating ? N  Using the Toilet? N  In the past six months, have you accidently leaked urine? N  Do you have problems with loss of bowel control? N  Managing your Medications? N  Managing your Finances? N  Housekeeping or managing your  Housekeeping? N  Some recent data might be hidden    Patient Care Team: [name], MD as PCP - General (Internal Medicine)   Assessment:    This is a routine wellness examination for [place]. The goal of the wellness visit is to assist the patient how to close the gaps in care and create a preventative care plan for the patient.   Taking calcium VIT D as appropriate/Osteoporosis reviewed.  Medications reviewed; taking without issues or barriers.  Safety issues reviewed; lives with wife.  Smoke detectors in the home. No firearms in the home. Wears seatbelts when driving or riding with others. No violence in the home.  No identified risk were noted; The patient was oriented x 3; appropriate in dress and manner and no objective failures at ADL's or IADL's.   BMI; discussed the importance of a healthy diet, water intake and exercise.  He has been eating more of a low carb diet and plans to increase his exercise regimen.  He has adequate water intake. Educational material provided.  AMI, NOS unspecified- stable and followed by Dr. Rockey Situ.   Patient Concerns: None at this time. Follow up with PCP as needed.  Exercise Activities and Dietary recommendations Current Exercise Habits: Home exercise routine (Bed/chair exercises. Active in the wood shop.), Type of exercise: walking, Time (Minutes): 10, Frequency (Times/Week): 1, Weekly Exercise (Minutes/Week): 10, Intensity: Mild  Goals    . Increase physical activity          Add 1 more day and 5 more minutes to bed/chair/standing exercises.  Increase as tolerated.      Fall Risk Fall Risk  11/06/2016 09/01/2016 11/25/2014 01/19/2014 12/06/2012  Falls in the past year? Yes No Yes Yes No  Number falls in past yr: 1 - 1 2 or more -  Injury with Fall? No - - No -  Risk Factor Category  - - - High Fall Risk -  Risk for fall due to : - - Impaired balance/gait - -  Follow up Education provided;Falls prevention discussed - - - -    Depression Screen PHQ 2/9 Scores 11/06/2016 09/01/2016 11/25/2014 01/19/2014  PHQ - 2 Score 0 0 0 0    Cognitive Function     6CIT Screen 11/06/2016  What Year? 0 points  What month? 0 points  What time? 0 points  Count back from 20 0 points  Months in reverse 0 points  Repeat phrase 0 points  Total Score 0    Immunization History  Administered Date(s) Administered  . Influenza Split 11/11/2012, 09/25/2014  . Influenza, High Dose Seasonal PF 10/15/2013, 10/01/2015  . Influenza-Unspecified 10/01/2016  . Pneumococcal Conjugate-13 11/25/2014, 04/03/2015  . Pneumococcal Polysaccharide-23 11/25/2012  .  Zoster 04/10/2011   Screening Tests Health Maintenance  Topic Date Due  . TETANUS/TDAP  11/05/2017 (Originally 05/07/1948)  . INFLUENZA VACCINE  Completed  . ZOSTAVAX  Completed  . PNA vac Low Risk Adult  Completed      Plan:    End of life planning; Advance aging; Advanced directives discussed. Copy of current HCPOA/Living Will requested.  Medicare Attestation I have personally reviewed: The patient's medical and social history Their use of alcohol, tobacco or illicit drugs Their current medications and supplements The patient's functional ability including ADLs,fall risks, home safety risks, cognitive, and hearing and visual impairment Diet and physical activities Evidence for depression   The patient's weight, height, BMI, and visual acuity have been recorded in the chart.  I have made referrals and provided education to the patient based on review of the above and I have provided the patient with a written personalized care plan for preventive services.    During the course of the visit the patient was educated and counseled about the following appropriate screening and preventive services:   Vaccines to include Pneumoccal, Influenza, Hepatitis B, Td, Zostavax, HCV  Electrocardiogram  Cardiovascular Disease  Colorectal cancer screening  Diabetes  screening  Prostate Cancer Screening  Glaucoma screening  Nutrition counseling   Smoking cessation counseling  Patient Instructions (the written plan) was given to the patient.    Varney Biles, LPN  [phone]

## 2016-11-07 NOTE — Progress Notes (Signed)
  I have reviewed the above information and agree with above.   Verlena Marlette, MD 

## 2016-11-10 ENCOUNTER — Other Ambulatory Visit: Payer: Self-pay | Admitting: Internal Medicine

## 2016-11-11 DIAGNOSIS — G4733 Obstructive sleep apnea (adult) (pediatric): Secondary | ICD-10-CM | POA: Diagnosis not present

## 2016-12-12 DIAGNOSIS — G4733 Obstructive sleep apnea (adult) (pediatric): Secondary | ICD-10-CM | POA: Diagnosis not present

## 2017-01-09 ENCOUNTER — Encounter: Payer: Self-pay | Admitting: Urology

## 2017-01-09 ENCOUNTER — Ambulatory Visit: Payer: Medicare Other | Admitting: Urology

## 2017-01-09 VITALS — BP 155/82 | HR 64 | Ht 70.0 in | Wt 204.0 lb

## 2017-01-09 DIAGNOSIS — R351 Nocturia: Secondary | ICD-10-CM

## 2017-01-09 DIAGNOSIS — N401 Enlarged prostate with lower urinary tract symptoms: Secondary | ICD-10-CM

## 2017-01-09 DIAGNOSIS — R338 Other retention of urine: Secondary | ICD-10-CM

## 2017-01-09 DIAGNOSIS — N400 Enlarged prostate without lower urinary tract symptoms: Secondary | ICD-10-CM

## 2017-01-09 LAB — URINALYSIS, COMPLETE
Bilirubin, UA: NEGATIVE
Glucose, UA: NEGATIVE
Leukocytes, UA: NEGATIVE
Nitrite, UA: NEGATIVE
Protein, UA: NEGATIVE
RBC, UA: NEGATIVE
Specific Gravity, UA: 1.02 (ref 1.005–1.030)
Urobilinogen, Ur: 0.2 mg/dL (ref 0.2–1.0)
pH, UA: 6 (ref 5.0–7.5)

## 2017-01-09 LAB — MICROSCOPIC EXAMINATION
Bacteria, UA: NONE SEEN
Epithelial Cells (non renal): NONE SEEN /hpf (ref 0–10)

## 2017-01-09 LAB — BLADDER SCAN AMB NON-IMAGING: Scan Result: 155

## 2017-01-09 MED ORDER — FINASTERIDE 5 MG PO TABS: 5.0000 mg | ORAL_TABLET | Freq: Every day | ORAL | 11 refills | Status: DC

## 2017-01-09 MED ORDER — FINASTERIDE 5 MG PO TABS: 5.0000 mg | ORAL_TABLET | Freq: Every day | ORAL | 3 refills | Status: DC

## 2017-01-09 NOTE — Progress Notes (Signed)
01/09/2017 10:04 AM   David Atkins [birth year] [id number]  Referring provider: [name], MD [place], [place] [address]  No chief complaint on file.   HPI: H/o BPH with LUTS for many years on tamsulosin and tolterodine combo. PVR was 89 in 2017. He had BPH on exam and we discussed finasteride.  Today, he is well. PVR 155 ml.  He voids with a good flow. Noc x 2-3 towards the AM. No frequency or urgency. He underwent CT Sep 2017 for flank pain which was normal. Prostate measured ~ 60 grams. No gross hematuria or dysuria.    PMH: Past Medical History:  Diagnosis Date  . 3-vessel coronary artery disease 2006   [place]  . basal cell   . BPH (benign prostatic hyperplasia)   . GERD (gastroesophageal reflux disease)   . Hyperlipidemia   . Hypertension   . Lumbar stenosis 12/03/13   MRI - lumbar pain with radiation down to LE  . Myocardial infarction 2006  . OAB (overactive bladder)   . Osteoporosis 2010   by DEXA at [facility]  . Sleep apnea   . Spondylolisthesis of lumbar region 11/23/13   MRI Lumbar spine    Surgical History: Past Surgical History:  Procedure Laterality Date  . APPENDECTOMY  1951  . COLONOSCOPY    . COLONOSCOPY WITH PROPOFOL N/A 11/15/2015   Procedure: COLONOSCOPY WITH PROPOFOL;  Surgeon: Hulen Luster, MD;  Location: [facility] ENDOSCOPY;  Service: Gastroenterology;  Laterality: N/A;  . CORONARY ARTERY BYPASS GRAFT  2006   3 vessel, s/p AMI  . NASAL SEPTOPLASTY W/ TURBINOPLASTY  1981  . TONSILLECTOMY      Home Medications:  Allergies as of 01/09/2017      Reactions   Sulfacetamide Sodium    Sulfa Antibiotics Rash      Medication List       Accurate as of 01/09/17 10:04 AM. Always use your most recent med list.          acetaminophen 325 MG tablet Commonly known as:  TYLENOL Take 650 mg by mouth 2 (two) times daily as needed.   aspirin 81 MG tablet Take 81 mg by mouth daily. Reported on 11/18/2015   Blood Pressure Monitor  Automat Devi Use daily to check blood pressure   CALCIUM + D PO Take 1 tablet by mouth daily.   meclizine 12.5 MG tablet Commonly known as:  ANTIVERT Take 1 tablet (12.5 mg total) by mouth 3 (three) times daily as needed for dizziness.   metoprolol tartrate 25 MG tablet Commonly known as:  LOPRESSOR TAKE 1/2 TABLET (25 MG TOTAL) BY MOUTH 2 (TWO) TIMES DAILY.   omeprazole 20 MG capsule Commonly known as:  PRILOSEC TAKE 1 CAPSULE (20 MG TOTAL) BY MOUTH DAILY.   polyethylene glycol powder powder Commonly known as:  GLYCOLAX/MIRALAX Take 17 g by mouth once. Dissolve 1 capful of power into any liquid and drink once daily   SAW PALMETTO PO Take 1 tablet by mouth 2 (two) times daily.   simvastatin 20 MG tablet Commonly known as:  ZOCOR TAKE 1 TABLET BY MOUTH AT BEDTIME. NEED APPT FOR MORE REFILLS   tamsulosin 0.4 MG Caps capsule Commonly known as:  FLOMAX TAKE 1 CAPSULE BY MOUTH DAILY AFTER BREAKFAST.   tolterodine 4 MG 24 hr capsule Commonly known as:  DETROL LA Take 4 mg by mouth daily.   Vitamin D-3 1000 units Caps Take 1 capsule by mouth daily.  Allergies:  Allergies  Allergen Reactions  . Sulfacetamide Sodium   . Sulfa Antibiotics Rash    Family History: Family History  Problem Relation Age of Onset  . Hyperlipidemia Mother   . Hypertension Mother   . Hypertension Father     Social History:  reports that he quit smoking about 53 years ago. His smoking use included Cigarettes. He has never used smokeless tobacco. He reports that he does not drink alcohol or use drugs.  ROS:                                        Physical Exam: There were no vitals taken for this visit.  Constitutional:  Alert and oriented, No acute distress. HEENT: [place] AT, moist mucus membranes.  Trachea midline, no masses. Cardiovascular: No clubbing, cyanosis, or edema. Respiratory: Normal respiratory effort, no increased work of breathing. GI: Abdomen is  soft, nontender, nondistended, no abdominal masses GU: No CVA tenderness Skin: No rashes, bruises or suspicious lesions. Lymph: No cervical or inguinal adenopathy. Neurologic: Grossly intact, no focal deficits, moving all 4 extremities. Psychiatric: Normal mood and affect. GU/DRE: prostate about 50 grams, smooth, no hard area or nodule, landmark preserved.   Laboratory Data: Lab Results  Component Value Date   WBC 7.8 10/17/2016   HGB 15.5 10/17/2016   HCT 46.3 10/17/2016   MCV 95.7 10/17/2016   PLT 208.0 10/17/2016    Lab Results  Component Value Date   CREATININE 0.98 10/17/2016    No results found for: PSA  No results found for: TESTOSTERONE  Lab Results  Component Value Date   HGBA1C 5.8 10/17/2016    Urinalysis    Component Value Date/Time   COLORURINE YELLOW 10/31/2015 2028   APPEARANCEUR CLEAR 10/31/2015 2028   LABSPEC 1.021 10/31/2015 2028   PHURINE 6.0 10/31/2015 2028   GLUCOSEU NEGATIVE 10/31/2015 2028   HGBUR NEGATIVE 10/31/2015 2028   BILIRUBINUR negative 09/01/2016 1416   BILIRUBINUR neg 06/05/2013 1056   KETONESUR trace (5) (A) 09/01/2016 1416   KETONESUR 15 (A) 10/31/2015 2028   PROTEINUR trace (A) 09/01/2016 1416   PROTEINUR NEGATIVE 10/31/2015 2028   UROBILINOGEN 1.0 09/01/2016 1416   NITRITE Negative 09/01/2016 1416   NITRITE NEGATIVE 10/31/2015 2028   LEUKOCYTESUR Negative 09/01/2016 1416    Pertinent Imaging: CT  Assessment & Plan:    1) BPH with LUTS - pt stable. PVR reasonable. Discussed adding 5ARI and FDA warnings. All questions answered. Will start finasteride. See in 3 mo with another PVR. We might be able to stop tamsulosin in the future.   There are no diagnoses linked to this encounter.  No Follow-up on file.  Festus Aloe, [facility] [address], [place], [place] [address] [phone]

## 2017-01-12 DIAGNOSIS — G4733 Obstructive sleep apnea (adult) (pediatric): Secondary | ICD-10-CM | POA: Diagnosis not present

## 2017-01-16 DIAGNOSIS — L728 Other follicular cysts of the skin and subcutaneous tissue: Secondary | ICD-10-CM | POA: Diagnosis not present

## 2017-01-16 DIAGNOSIS — L570 Actinic keratosis: Secondary | ICD-10-CM | POA: Diagnosis not present

## 2017-02-06 ENCOUNTER — Other Ambulatory Visit: Payer: Self-pay | Admitting: Internal Medicine

## 2017-02-07 ENCOUNTER — Other Ambulatory Visit: Payer: Self-pay | Admitting: Internal Medicine

## 2017-02-09 DIAGNOSIS — G4733 Obstructive sleep apnea (adult) (pediatric): Secondary | ICD-10-CM | POA: Diagnosis not present

## 2017-02-22 ENCOUNTER — Other Ambulatory Visit: Payer: Self-pay | Admitting: Internal Medicine

## 2017-02-26 ENCOUNTER — Other Ambulatory Visit: Payer: Self-pay | Admitting: Internal Medicine

## 2017-02-28 ENCOUNTER — Other Ambulatory Visit: Payer: Self-pay | Admitting: Internal Medicine

## 2017-03-01 ENCOUNTER — Telehealth: Payer: Self-pay | Admitting: Internal Medicine

## 2017-03-01 NOTE — Telephone Encounter (Signed)
Spoke with pt and let him know that we called the pharmacy they have the rx and they are going to get it ready for him.

## 2017-03-01 NOTE — Telephone Encounter (Signed)
Pt called requesting a refill on tamsulosin (FLOMAX) 0.4 MG CAPS capsule. Pt is completely out of it. Please advise, thank you!  Pharmacy - [id number] - [place], [place]  Call pt @ [phone]

## 2017-03-06 DIAGNOSIS — G4733 Obstructive sleep apnea (adult) (pediatric): Secondary | ICD-10-CM | POA: Diagnosis not present

## 2017-03-21 NOTE — Progress Notes (Signed)
Home Cardiology Office Note  Date:  03/22/2017   ID:  David Atkins, DOB September 02, 1929, MRN [id number]  PCP:  [name], MD   Chief Complaint  Patient presents with  . other    83yo f/u. Pt states he is doing well. Reviewed meds with pt verbally.    HPI:  Mr. Zavadil is a very pleasant 83 year old gentleman with a history of  coronary artery disease, bypass surgery in 2006 at [facility] in [place],  mild carotid arterial disease by report,  hyperlipidemia,  small smoking history in the remote past,  aortic athero on CT 08/2018 who presents for routine followup of his coronary artery disease .   patient of Dr. Derrel Nip.   active, works in his workshop Does his chores,including his shopping No regular exercise program  No new complaints,  no chest pain concerning for angina, no significant shortness of breath on exertion  Lab work reviewed with him in detail Total chol 124,  LDL 67  EKG personally reviewed by myself on todays visit shows normal sinus rhythm with rate 66 bpm, no significant ST or T-wave changes  Other past medical history  01/06/2014, he reported that he had general malaise, blood pressure checked showed systolic pressure greater than 200. He went to the emergency room for further evaluation. Workup there was negative including normal basic metabolic panel, negative cardiac enzymes. CT scan showed chronic microvascular changes in the deep white matter, and he was discharged home after medical management of his blood pressure. On a previous visit, his Son was concerned about various arguments he may be having with other family members at home and the stress this may be causing him.    PMH:   has a past medical history of 3-vessel coronary artery disease (2006); basal cell; BPH (benign prostatic hyperplasia); GERD (gastroesophageal reflux disease); Hyperlipidemia; Hypertension; Lumbar stenosis (12/03/13); Myocardial infarction [facility]) (2006);  OAB (overactive bladder); Osteoporosis (2010); Sleep apnea; and Spondylolisthesis of lumbar region (11/23/13).  PSH:    Past Surgical History:  Procedure Laterality Date  . APPENDECTOMY  1951  . COLONOSCOPY    . COLONOSCOPY WITH PROPOFOL N/A 11/15/2015   Procedure: COLONOSCOPY WITH PROPOFOL;  Surgeon: Hulen Luster, MD;  Location: [facility] ENDOSCOPY;  Service: Gastroenterology;  Laterality: N/A;  . CORONARY ARTERY BYPASS GRAFT  2006   3 vessel, s/p AMI  . NASAL SEPTOPLASTY W/ TURBINOPLASTY  1981  . TONSILLECTOMY      Current Outpatient Prescriptions  Medication Sig Dispense Refill  . acetaminophen (TYLENOL) 325 MG tablet Take 650 mg by mouth 2 (two) times daily as needed.    Marland Kitchen aspirin 81 MG chewable tablet Chew 81 mg by mouth daily.    . Blood Pressure Monitoring (BLOOD PRESSURE MONITOR AUTOMAT) DEVI Use daily to check blood pressure 1 Device 0  . Calcium Carbonate-Vitamin D (CALCIUM + D PO) Take 1 tablet by mouth daily.     . Cholecalciferol (VITAMIN D-3) 1000 UNITS CAPS Take 1 capsule by mouth daily.    . finasteride (PROSCAR) 5 MG tablet Take 1 tablet (5 mg total) by mouth daily. 30 tablet 11  . meclizine (ANTIVERT) 12.5 MG tablet Take 1 tablet (12.5 mg total) by mouth 3 (three) times daily as needed for dizziness. 30 tablet 5  . metoprolol tartrate (LOPRESSOR) 25 MG tablet TAKE 1/2 TABLET (25 MG TOTAL) BY MOUTH 2 (TWO) TIMES DAILY. 180 tablet 1  . omeprazole (PRILOSEC) 20 MG capsule TAKE 1 CAPSULE (20 MG TOTAL) BY MOUTH  DAILY. 90 capsule 1  . polyethylene glycol powder (GLYCOLAX/MIRALAX) powder Take 17 g by mouth once. Dissolve 1 capful of power into any liquid and drink once daily (Patient taking differently: Take 17 g by mouth 2 (two) times daily. Dissolve 1 capful of power into any liquid and drink once daily) 500 g 0  . Saw Palmetto, Serenoa repens, (SAW PALMETTO PO) Take 1 tablet by mouth 2 (two) times daily.     . simvastatin (ZOCOR) 20 MG tablet TAKE 1 TABLET BY MOUTH AT BEDTIME. NEED  APPT FOR MORE REFILLS 90 tablet 0  . tamsulosin (FLOMAX) 0.4 MG CAPS capsule TAKE 1 CAPSULE BY MOUTH DAILY AFTER BREAKFAST. 90 capsule 2  . tolterodine (DETROL LA) 4 MG 24 hr capsule Take 4 mg by mouth daily.     No current facility-administered medications for this visit.      Allergies:   Sulfacetamide sodium and Sulfa antibiotics   Social History:  The patient  reports that he quit smoking about 53 years ago. His smoking use included Cigarettes. He has never used smokeless tobacco. He reports that he does not drink alcohol or use drugs.   Family History:   family history includes Hyperlipidemia in his mother; Hypertension in his father and mother.    Review of Systems: Review of Systems  Constitutional: Negative.   Respiratory: Negative.   Cardiovascular: Negative.   Gastrointestinal: Negative.   Musculoskeletal: Positive for back pain.  Neurological: Negative.   Psychiatric/Behavioral: Negative.   All other systems reviewed and are negative.    PHYSICAL EXAM: VS:  BP (!) 150/80 (BP Location: Left Arm, Patient Position: Sitting, Cuff Size: Normal)   Pulse 66   Ht 5\' 10"  (1.778 m)   Wt 204 lb (92.5 kg)   BMI 29.27 kg/m  , BMI Body mass index is 29.27 kg/m. GEN: Well nourished, well developed, in no acute distress  HEENT: normal  Neck: no JVD, carotid bruits, or masses Cardiac: RRR; no murmurs, rubs, or gallops,no edema  Respiratory:  clear to auscultation bilaterally, normal work of breathing GI: soft, nontender, nondistended, + BS MS: no deformity or atrophy  Skin: warm and dry, no rash Neuro:  Strength and sensation are intact Psych: euthymic mood, full affect    Recent Labs: 10/17/2016: ALT 29; BUN 16; Creatinine, Ser 0.98; Hemoglobin 15.5; Platelets 208.0; Potassium 4.3; Sodium 142; TSH 1.93    Lipid Panel Lab Results  Component Value Date   CHOL 124 12/10/2015   HDL 43.60 12/10/2015   LDLCALC 65 12/10/2015   TRIG 75.0 12/10/2015      Wt Readings  from Last 3 Encounters:  03/22/17 204 lb (92.5 kg)  01/09/17 204 lb (92.5 kg)  11/06/16 201 lb 6.4 oz (91.4 kg)       ASSESSMENT AND PLAN:  3-vessel coronary artery disease - Plan: EKG 12-Lead No changes to his medication regiment, currently asymptomatic No further testing at this time  Essential hypertension - Plan: EKG 12-Lead Blood pressure is well controlled on today's visit. No changes made to the medications.  Hyperlipidemia LDL goal <70 Cholesterol is at goal on the current lipid regimen. No changes to the medications were made.  Bilateral carotid artery stenosis History of mild bilateral carotid disease Cholesterol at goal  Hx of CABG Stable,  no new testing   Total encounter time more than 15 minutes  Greater than 50% was spent in counseling and coordination of care with the patient   Disposition:   F/U  6  months   Orders Placed This Encounter  Procedures  . EKG 12-Lead     Signed, Esmond Plants, M.D., Ph.D. 03/22/2017  [place], [place]

## 2017-03-22 ENCOUNTER — Ambulatory Visit (INDEPENDENT_AMBULATORY_CARE_PROVIDER_SITE_OTHER): Payer: Medicare Other | Admitting: Cardiovascular Disease

## 2017-03-22 ENCOUNTER — Encounter: Payer: Self-pay | Admitting: Cardiovascular Disease

## 2017-03-22 VITALS — BP 150/80 | HR 66 | Ht 70.0 in | Wt 204.0 lb

## 2017-03-22 DIAGNOSIS — Z951 Presence of aortocoronary bypass graft: Secondary | ICD-10-CM

## 2017-03-22 DIAGNOSIS — I10 Essential (primary) hypertension: Secondary | ICD-10-CM

## 2017-03-22 DIAGNOSIS — I25118 Atherosclerotic heart disease of native coronary artery with other forms of angina pectoris: Secondary | ICD-10-CM | POA: Diagnosis not present

## 2017-03-22 DIAGNOSIS — I6523 Occlusion and stenosis of bilateral carotid arteries: Secondary | ICD-10-CM | POA: Diagnosis not present

## 2017-03-22 DIAGNOSIS — F411 Generalized anxiety disorder: Secondary | ICD-10-CM | POA: Diagnosis not present

## 2017-03-22 NOTE — Patient Instructions (Signed)
Medication Instructions:   No medication changes made  Call the office if leg swelling gets worse  Call when you would like a refill of the lisinopril  Try the allergy medication: zyrtec (CETIRAZINE)  Labwork:  No new labs needed  Testing/Procedures:  No further testing at this time   I recommend watching educational videos on topics of interest to you at:       www.goemmi.com  Enter code: HEARTCARE    Follow-Up: It was a pleasure seeing you in the office today. Please call us if you have new issues that need to be addressed before your next appt.  [phone]  Your physician wants you to follow-up in: 6 months.  You will receive a reminder letter in the mail two months in advance. If you don't receive a letter, please call our office to schedule the follow-up appointment.  If you need a refill on your cardiac medications before your next appointment, please call your pharmacy.

## 2017-03-28 ENCOUNTER — Telehealth: Payer: Self-pay | Admitting: Urology

## 2017-03-28 NOTE — Telephone Encounter (Signed)
Dr Junious Silk started the patient on a new medication for his prostate at his last visit, but he thinks it has caused him more irritation (pain, frequency, and difficulty urinating).  He has an upcoming appt, but cannot wait until then to discuss with Dr. Junious Silk.  He is looking for some advice.  Please contact him.

## 2017-03-29 NOTE — Telephone Encounter (Signed)
Spoke with pt in reference to s/s he has developed recently. Pt voiced concern of finasteride causing s/s. Reinforced with pt that finasteride does not typically cause those s/s. Offered pt a nurse visit to check urine for possible infection. Pt was added to nurse schedule for tomorrow.

## 2017-03-30 ENCOUNTER — Ambulatory Visit (INDEPENDENT_AMBULATORY_CARE_PROVIDER_SITE_OTHER): Payer: Medicare Other

## 2017-03-30 VITALS — BP 141/93 | Ht 70.0 in | Wt 202.0 lb

## 2017-03-30 DIAGNOSIS — R300 Dysuria: Secondary | ICD-10-CM | POA: Diagnosis not present

## 2017-03-30 LAB — URINALYSIS, COMPLETE
Bilirubin, UA: NEGATIVE
Glucose, UA: NEGATIVE
Ketones, UA: NEGATIVE
Leukocytes, UA: NEGATIVE
Nitrite, UA: NEGATIVE
Protein, UA: NEGATIVE
RBC, UA: NEGATIVE
Specific Gravity, UA: 1.02 (ref 1.005–1.030)
Urobilinogen, Ur: 0.2 mg/dL (ref 0.2–1.0)
pH, UA: 5.5 (ref 5.0–7.5)

## 2017-03-30 NOTE — Progress Notes (Signed)
Pt presents today with c/o dysuria and urinary urgency. A clean catch specimen was obtained for u/a and cx.   Blood pressure (!) 141/93, height 5\' 10"  (1.778 m), weight 202 lb (91.6 kg).

## 2017-04-03 ENCOUNTER — Telehealth: Payer: Self-pay

## 2017-04-03 LAB — CULTURE, URINE COMPREHENSIVE

## 2017-04-03 NOTE — Telephone Encounter (Signed)
David Retort, MD  [name], LPN        Please let patient know urine culture was normal. thanks    Spoke with pt in reference to ucx results. Pt voiced understanding.

## 2017-04-08 ENCOUNTER — Other Ambulatory Visit: Payer: Self-pay | Admitting: Internal Medicine

## 2017-04-11 ENCOUNTER — Ambulatory Visit: Payer: Medicare Other | Admitting: Urology

## 2017-04-11 ENCOUNTER — Encounter: Payer: Self-pay | Admitting: Urology

## 2017-04-11 VITALS — BP 147/77 | HR 72 | Ht 70.0 in | Wt 202.4 lb

## 2017-04-11 DIAGNOSIS — R350 Frequency of micturition: Secondary | ICD-10-CM | POA: Diagnosis not present

## 2017-04-11 DIAGNOSIS — N138 Other obstructive and reflux uropathy: Secondary | ICD-10-CM

## 2017-04-11 DIAGNOSIS — N401 Enlarged prostate with lower urinary tract symptoms: Secondary | ICD-10-CM | POA: Diagnosis not present

## 2017-04-11 LAB — BLADDER SCAN AMB NON-IMAGING: Scan Result: 0

## 2017-04-11 NOTE — Progress Notes (Signed)
04/11/2017 11:11 AM   David Atkins 1929-02-16 [id number]  Referring provider: [name], MD [name], [name] [address]  Chief Complaint  Patient presents with  . Follow-up    HPI: F/u -   H/o BPH with LUTS for many years on tamsulosin and tolterodine combo. PVR was 89 in 2017. PVR was 155 mL Feb 2018. DRE was normal. Good flow, Noc x 2-3 towards the AM. No frequency or urgency. He underwent CT Sep 2017 for flank pain which was normal. Prostate measured ~ 60 grams. Finasteride was added.  Today, pt is seen for the above. He returned with dysuria a few weeks ago. UA was normal, Cx grew 25-50k mixed growth.  He did not take antibiotics. His symptoms cleared up. He started finasteride and noted more difficult urine especially at night. This cleared up after a week. He continues tamsulosin, tolterodine and finasteride.   PVR is 0 mL.   PMH: Past Medical History:  Diagnosis Date  . 3-vessel coronary artery disease 2006   [place]  . basal cell   . BPH (benign prostatic hyperplasia)   . GERD (gastroesophageal reflux disease)   . Hyperlipidemia   . Hypertension   . Lumbar stenosis 12/03/13   MRI - lumbar pain with radiation down to LE  . Myocardial infarction ([place]) 2006  . OAB (overactive bladder)   . Osteoporosis 2010   by DEXA at [facility]  . Sleep apnea   . Spondylolisthesis of lumbar region 11/23/13   MRI Lumbar spine    Surgical History: Past Surgical History:  Procedure Laterality Date  . APPENDECTOMY  1951  . COLONOSCOPY    . COLONOSCOPY WITH PROPOFOL N/A 11/15/2015   Procedure: COLONOSCOPY WITH PROPOFOL;  Surgeon: Hulen Luster, MD;  Location: [facility] ENDOSCOPY;  Service: Gastroenterology;  Laterality: N/A;  . CORONARY ARTERY BYPASS GRAFT  2006   3 vessel, s/p AMI  . NASAL SEPTOPLASTY W/ TURBINOPLASTY  1981  . TONSILLECTOMY      Home Medications:  Allergies as of 04/11/2017      Reactions   Sulfacetamide Sodium    Sulfa Antibiotics Rash        Medication List       Accurate as of 04/11/17 11:11 AM. Always use your most recent med list.          acetaminophen 325 MG tablet Commonly known as:  TYLENOL Take 650 mg by mouth 2 (two) times daily as needed.   aspirin 81 MG chewable tablet Chew 81 mg by mouth daily.   Blood Pressure Monitor Automat Devi Use daily to check blood pressure   CALCIUM + D PO Take 1 tablet by mouth daily.   finasteride 5 MG tablet Commonly known as:  PROSCAR Take 1 tablet (5 mg total) by mouth daily.   lisinopril 20 MG tablet Commonly known as:  PRINIVIL,ZESTRIL Take 20 mg by mouth daily as needed.   meclizine 12.5 MG tablet Commonly known as:  ANTIVERT Take 1 tablet (12.5 mg total) by mouth 3 (three) times daily as needed for dizziness.   metoprolol tartrate 25 MG tablet Commonly known as:  LOPRESSOR TAKE 1/2 TABLET (25 MG TOTAL) BY MOUTH 2 (TWO) TIMES DAILY.   omeprazole 20 MG capsule Commonly known as:  PRILOSEC TAKE 1 CAPSULE (20 MG TOTAL) BY MOUTH DAILY.   polyethylene glycol powder powder Commonly known as:  GLYCOLAX/MIRALAX Take 17 g by mouth once. Dissolve 1 capful of power into any liquid and drink  once daily   SAW PALMETTO PO Take 1 tablet by mouth 2 (two) times daily.   simvastatin 20 MG tablet Commonly known as:  ZOCOR TAKE 1 TABLET BY MOUTH AT BEDTIME. NEED APPT FOR MORE REFILLS   tamsulosin 0.4 MG Caps capsule Commonly known as:  FLOMAX TAKE 1 CAPSULE BY MOUTH DAILY AFTER BREAKFAST.   tolterodine 4 MG 24 hr capsule Commonly known as:  DETROL LA Take 4 mg by mouth daily.   Vitamin D-3 1000 units Caps Take 1 capsule by mouth daily.       Allergies:  Allergies  Allergen Reactions  . Sulfacetamide Sodium   . Sulfa Antibiotics Rash    Family History: Family History  Problem Relation Age of Onset  . Hyperlipidemia Mother   . Hypertension Mother   . Hypertension Father     Social History:  reports that he quit smoking about 53 years ago. His  smoking use included Cigarettes. He has never used smokeless tobacco. He reports that he does not drink alcohol or use drugs.  ROS: UROLOGY Frequent Urination?: Yes Hard to postpone urination?: No Burning/pain with urination?: Yes Get up at night to urinate?: Yes Leakage of urine?: No Urine stream starts and stops?: No Trouble starting stream?: Yes Do you have to strain to urinate?: Yes                                      Physical Exam: BP (!) 147/77 (BP Location: Left Arm, Patient Position: Sitting, Cuff Size: Large)   Pulse 72   Ht 5\' 10"  (1.778 m)   Wt 91.8 kg (202 lb 6.4 oz)   BMI 29.04 kg/m   Constitutional:  Alert and oriented, No acute distress. HEENT: [place] AT, moist mucus membranes.  Trachea midline, no masses. Cardiovascular: No clubbing, cyanosis, or edema. Respiratory: Normal respiratory effort, no increased work of breathing. GI: Abdomen is soft, nontender, nondistended, no abdominal masses GU: No CVA tenderness.  Skin: No rashes, bruises or suspicious lesions. Lymph: No cervical or inguinal adenopathy. Neurologic: Grossly intact, no focal deficits, moving all 4 extremities. Psychiatric: Normal mood and affect.  Laboratory Data: Lab Results  Component Value Date   WBC 7.8 10/17/2016   HGB 15.5 10/17/2016   HCT 46.3 10/17/2016   MCV 95.7 10/17/2016   PLT 208.0 10/17/2016    Lab Results  Component Value Date   CREATININE 0.98 10/17/2016    No results found for: PSA  No results found for: TESTOSTERONE  Lab Results  Component Value Date   HGBA1C 5.8 10/17/2016     Assessment & Plan:   BPH with LUTS --  He'll continue triple therapy and we'll see him back in 6 months for DRE and PVR and then yearly. He asked about PCa screening. We discussed his DRE is normal, We discussed the nature r/b of PSA screening and he elected not to screen. He also asked about prostate procedures and we discussed UroLift, laser and TURP.   There are no  diagnoses linked to this encounter.  No Follow-up on file.  Festus Aloe, [facility] [address], [place], [place] [address] [phone]

## 2017-04-18 ENCOUNTER — Telehealth: Payer: Self-pay | Admitting: Internal Medicine

## 2017-04-18 NOTE — Telephone Encounter (Signed)
Pt states that he now wants all RX to go to [facility].Marland Kitchen

## 2017-04-20 NOTE — Telephone Encounter (Signed)
Changed preferred pharmacy to [facility], thanks

## 2017-05-04 ENCOUNTER — Other Ambulatory Visit: Payer: Self-pay

## 2017-05-04 MED ORDER — METOPROLOL TARTRATE 25 MG PO TABS: ORAL_TABLET | ORAL | 1 refills | Status: DC

## 2017-05-04 MED ORDER — SIMVASTATIN 20 MG PO TABS: ORAL_TABLET | ORAL | 0 refills | Status: DC

## 2017-05-04 MED ORDER — FINASTERIDE 5 MG PO TABS: 5.0000 mg | ORAL_TABLET | Freq: Every day | ORAL | 3 refills | Status: DC

## 2017-05-04 MED ORDER — OMEPRAZOLE 20 MG PO CPDR: DELAYED_RELEASE_CAPSULE | ORAL | 1 refills | Status: DC

## 2017-05-04 NOTE — Telephone Encounter (Signed)
Medication has been re ordered due to patient changing pharmacy.

## 2017-05-04 NOTE — Telephone Encounter (Signed)
Medication has been refilled.  Tamsulosin has not been refilled due to it not being ready for refill.

## 2017-05-21 ENCOUNTER — Other Ambulatory Visit: Payer: Self-pay | Admitting: Internal Medicine

## 2017-06-04 IMAGING — CT CT RENAL STONE PROTOCOL
2 of 4 series · 17 of 46 positions shown, 19 images · non-contrast
Comparison: CT scan of October 31, 2015.

CLINICAL DATA: Right flank pain for 3 days.

EXAM:
CT ABDOMEN AND PELVIS WITHOUT CONTRAST
TECHNIQUE: Multidetector CT imaging of the abdomen and pelvis was performed
following the standard protocol without IV contrast.

[Series 2: axial st · axial · 0.93mm/px · z∈[-978,-538]mm · 14 of 97 slices shown, 16 images]
[im 5/97  soft-tissue]
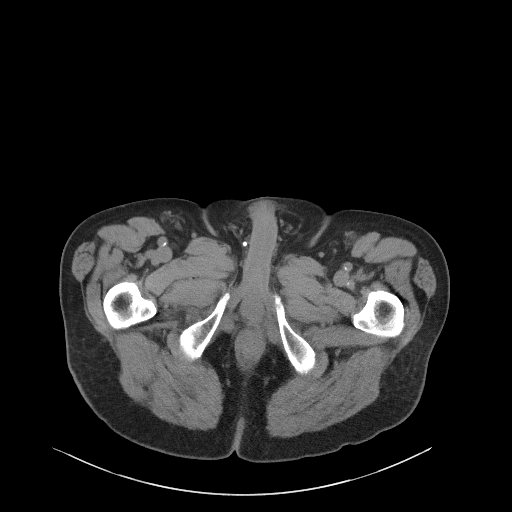
[im 5/97  bone]
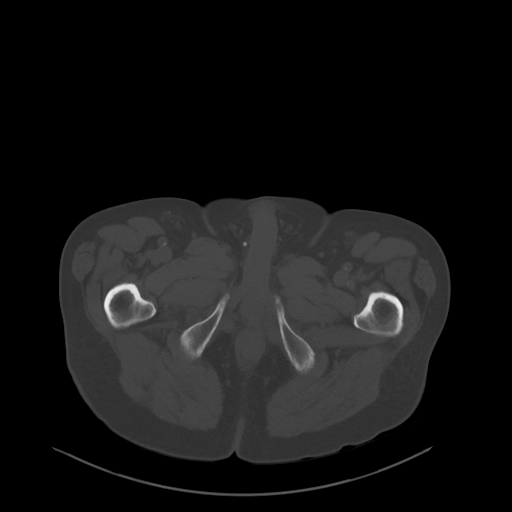
[im 13/97  soft-tissue]
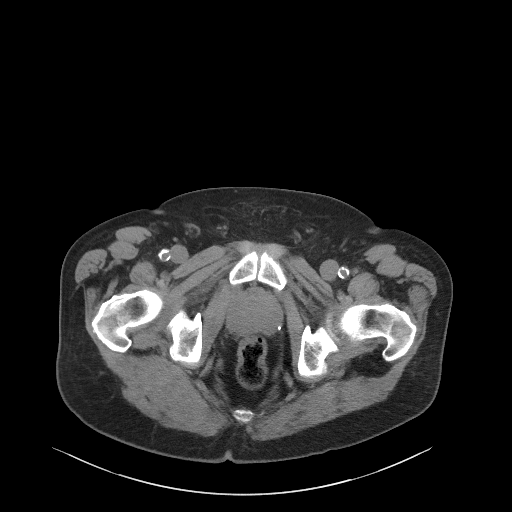
[im 21/97  soft-tissue]
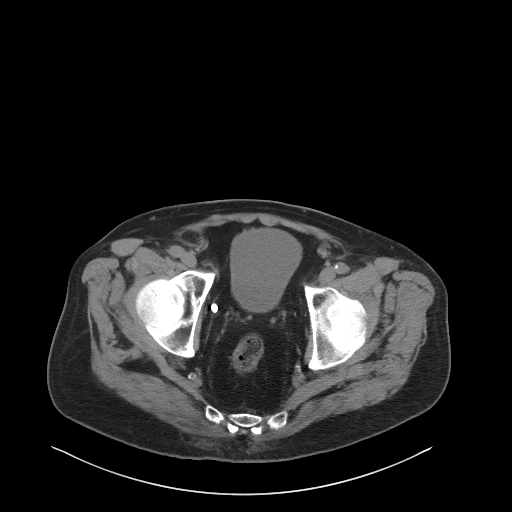
[im 25/97  soft-tissue]
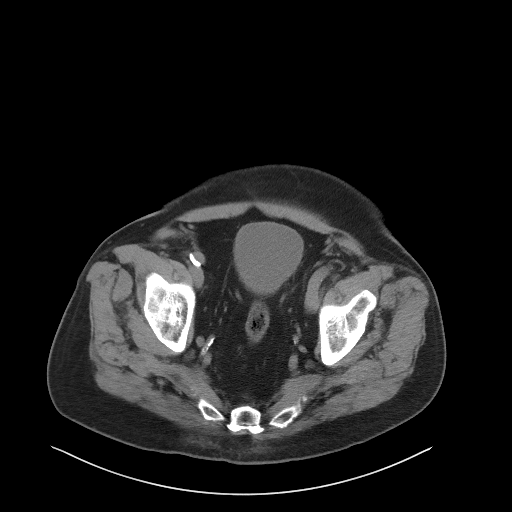
[im 33/97  soft-tissue]
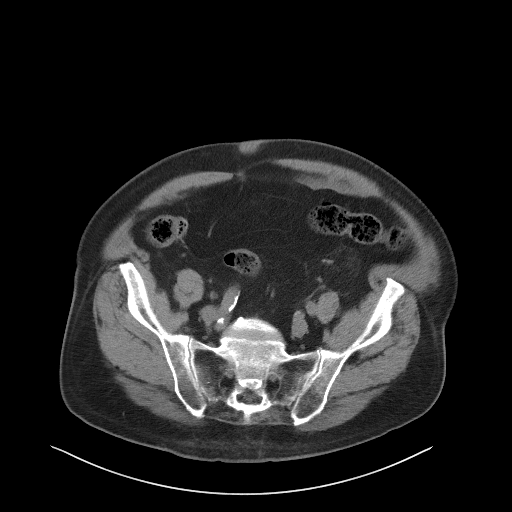
[im 41/97  soft-tissue]
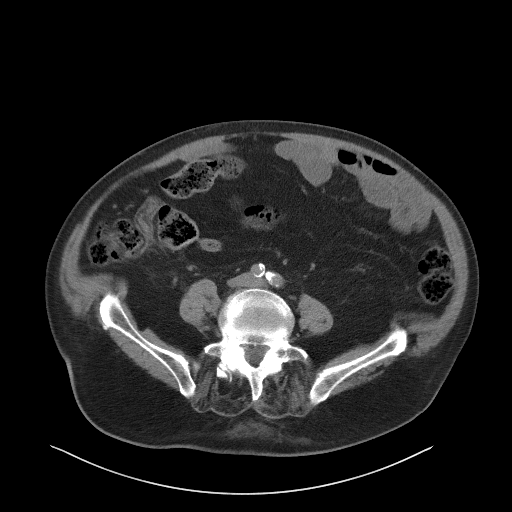
[im 45/97  soft-tissue]
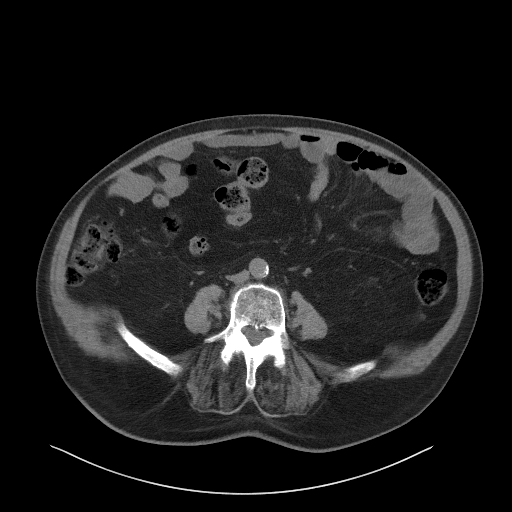
[im 53/97  soft-tissue]
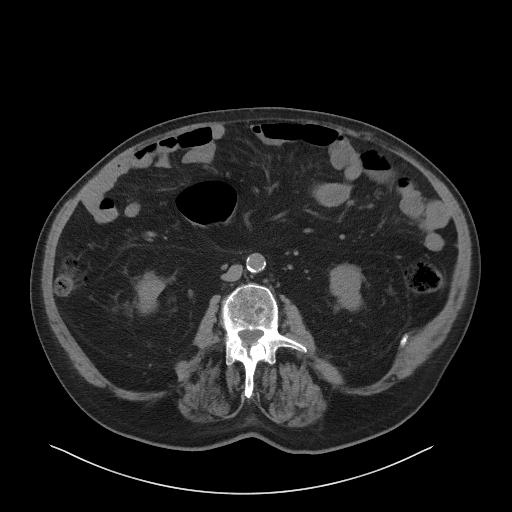
[im 57/97  soft-tissue]
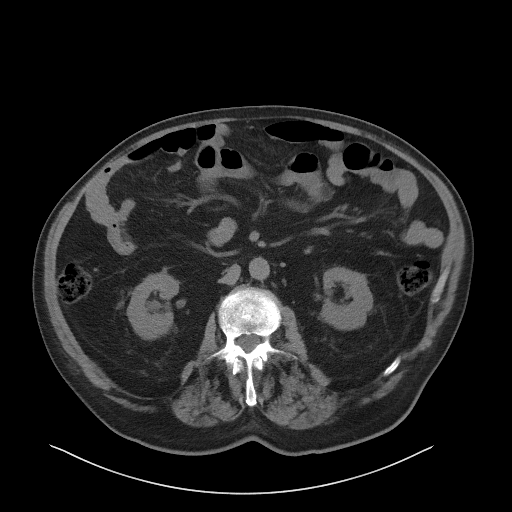
[im 57/97  bone]
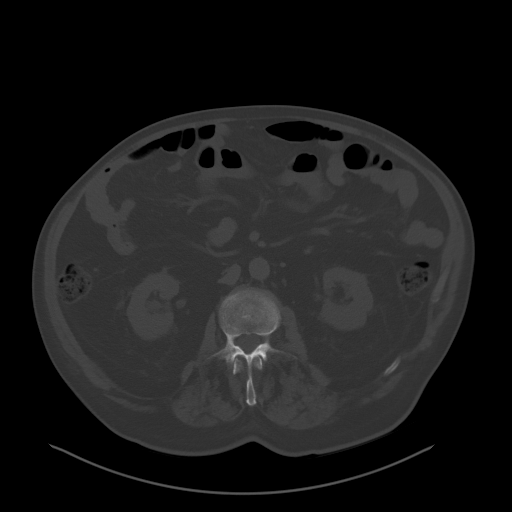
[im 65/97  soft-tissue]
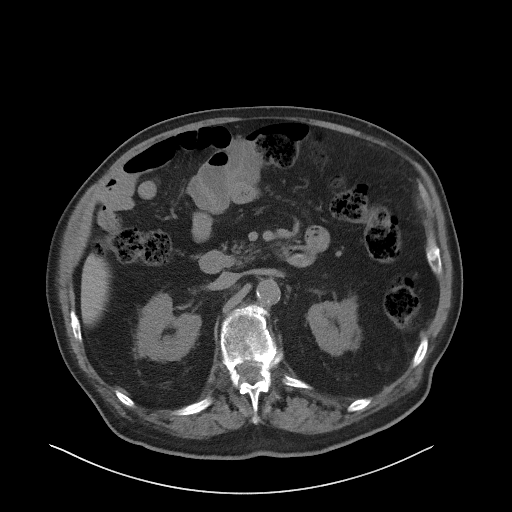
[im 73/97  soft-tissue]
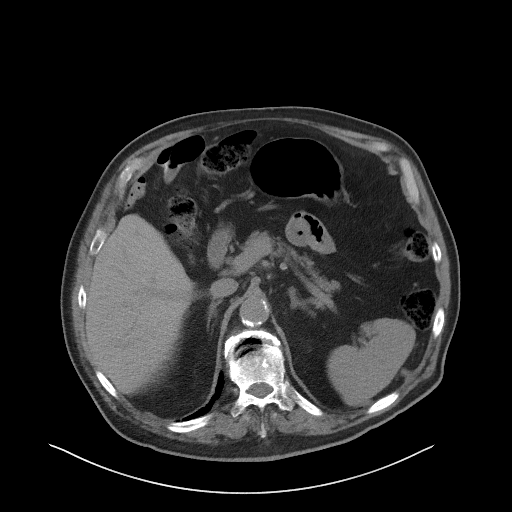
[im 77/97  soft-tissue]
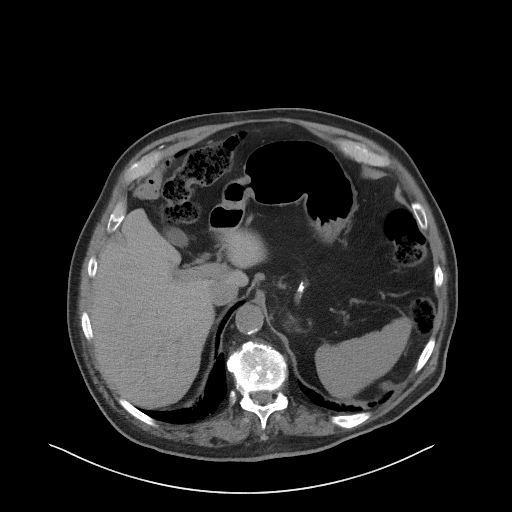
[im 85/97  soft-tissue]
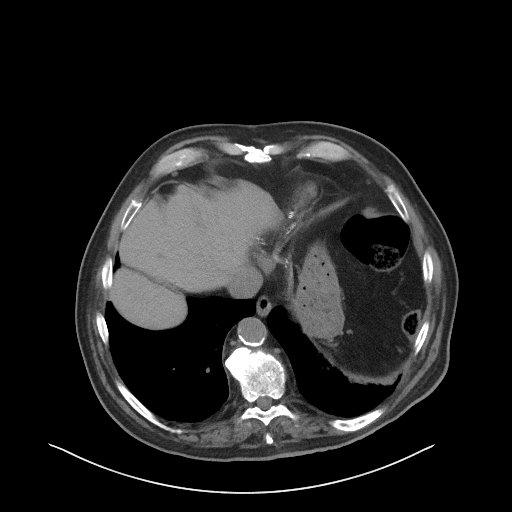
[im 93/97  soft-tissue]
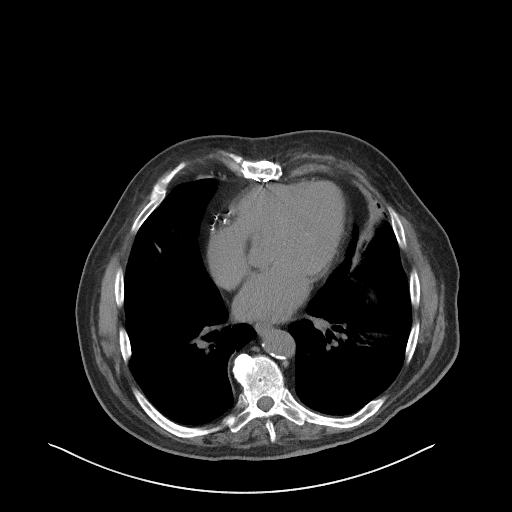

[Series 5: coronal · coronal · 0.87mm/px · 3 of 169 slices shown]
[im 57/169  soft-tissue]
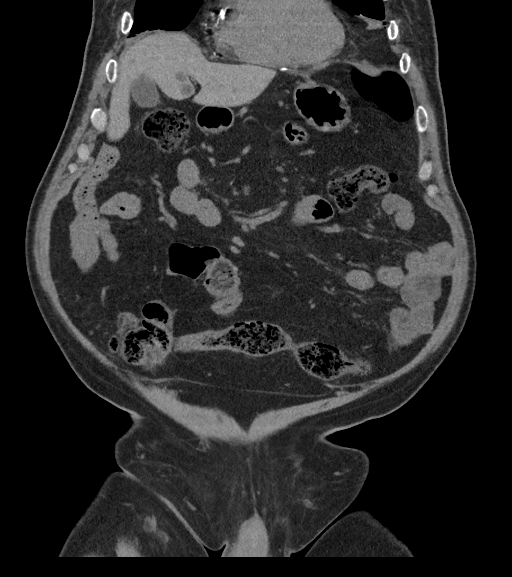
[im 75/169  soft-tissue]
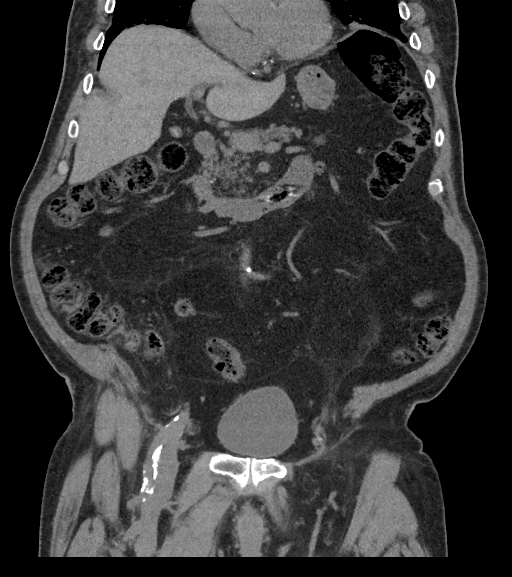
[im 94/169  soft-tissue]
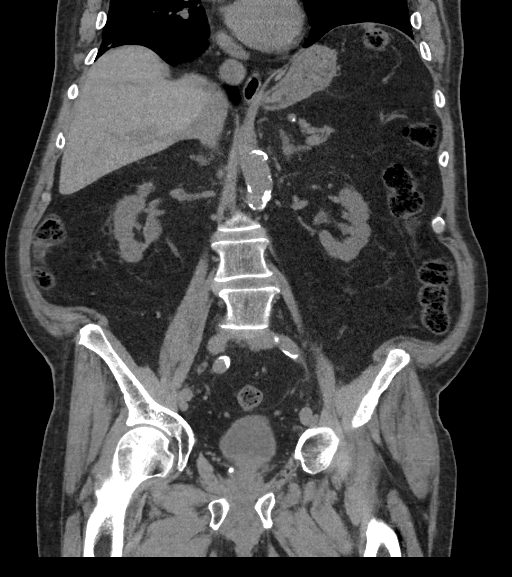

[17 of 46 positions shown; findings below may reference images not displayed]

FINDINGS: Lower chest: Visualized lung bases are unremarkable.

Hepatobiliary: Several small gallstones are noted in neck of
gallbladder. Stable hepatic cysts are noted.

Pancreas: Normal.

Spleen: Normal.

Adrenals/Urinary Tract: Adrenal glands are unremarkable. No
hydronephrosis or renal obstruction is noted. Bilateral renal
cortical scarring is noted. No renal or ureteral calculi are noted.
Urinary bladder appears normal.

Stomach/Bowel: There is no evidence of bowel obstruction.

Vascular/Lymphatic: Atherosclerosis of abdominal aorta is noted
without aneurysm formation. No significant adenopathy is noted.

Reproductive: Mild prostatic enlargement is noted and stable.

Other: No abnormal fluid collection is noted.

Musculoskeletal: Old T12 compression fracture is noted. Degenerative
changes seen involving the posterior facet joints of L4-5 and L5-S1.
IMPRESSION: Mild cholelithiasis.

Aortic atherosclerosis.

Stable mild prostatic enlargement.

No hydronephrosis or renal obstruction is noted. No renal or
ureteral calculi is noted.

## 2017-06-22 ENCOUNTER — Other Ambulatory Visit: Payer: Self-pay | Admitting: Internal Medicine

## 2017-06-27 DIAGNOSIS — G4733 Obstructive sleep apnea (adult) (pediatric): Secondary | ICD-10-CM | POA: Diagnosis not present

## 2017-07-18 ENCOUNTER — Encounter: Payer: Self-pay | Admitting: Family Medicine

## 2017-07-18 ENCOUNTER — Other Ambulatory Visit: Payer: Self-pay | Admitting: Internal Medicine

## 2017-07-18 ENCOUNTER — Ambulatory Visit
Admission: RE | Admit: 2017-07-18 | Discharge: 2017-07-18 | Disposition: A | Discharge status: Home / Self Care | Payer: Medicare Other | Source: Ambulatory Visit | Attending: Family Medicine | Admitting: Family Medicine

## 2017-07-18 ENCOUNTER — Ambulatory Visit (INDEPENDENT_AMBULATORY_CARE_PROVIDER_SITE_OTHER): Payer: Medicare Other | Admitting: Family Medicine

## 2017-07-18 ENCOUNTER — Other Ambulatory Visit: Payer: Self-pay

## 2017-07-18 VITALS — BP 138/74 | HR 65 | Temp 98.0°F | Resp 18 | Wt 204.0 lb

## 2017-07-18 DIAGNOSIS — M7989 Other specified soft tissue disorders: Secondary | ICD-10-CM | POA: Diagnosis not present

## 2017-07-18 DIAGNOSIS — G2581 Restless legs syndrome: Secondary | ICD-10-CM | POA: Diagnosis not present

## 2017-07-18 DIAGNOSIS — R600 Localized edema: Secondary | ICD-10-CM | POA: Insufficient documentation

## 2017-07-18 MED ORDER — GABAPENTIN 300 MG PO CAPS: 300.0000 mg | ORAL_CAPSULE | Freq: Every day | ORAL | 0 refills | Status: DC

## 2017-07-18 NOTE — Assessment & Plan Note (Signed)
New acute problem. Uncertain etiology at this time. Concern for DVT given exam findings. Arranging [place].

## 2017-07-18 NOTE — Assessment & Plan Note (Signed)
New problem (to me). Trial of Gabapentin.

## 2017-07-18 NOTE — Patient Instructions (Signed)
We will call with the results.  Take care  Dr. Azrielle Springsteen  

## 2017-07-18 NOTE — Progress Notes (Signed)
Subjective:  Patient ID: David Atkins, male    DOB: [birth year]  Age: 83 y.o. MRN: [id number]  CC: Ankle swelling, RLS  HPI:  83 year old male with an extensive past medical history presents with the above complaints.  Ankle swelling  Patient reports ankle swelling 1 week.  No fall, trauma, injury.  No recent travel.  He has little to no swelling on the other leg.  He states that he's been using an over-the-counter topical salve with some improvement.  No known exacerbating factors. No other associated symptoms.  Restless leg syndrome  Patient reports that he has a history of restless leg syndrome. No comments about this and his problem list.  He states that he was prescribed gabapentin previously but did not take it.  He states that as of late he's been experiencing worsening symptoms. He would like to discuss use of gabapentin.  Social Hx   Social History   Social History  . Marital status: Married    Spouse name: N/A  . Number of children: N/A  . Years of education: N/A   Social History Main Topics  . Smoking status: Former Smoker    Types: Cigarettes    Quit date: 07/10/1963  . Smokeless tobacco: Never Used  . Alcohol use No  . Drug use: No  . Sexual activity: Not Currently   Other Topics Concern  . None   Social History Narrative  . None    Review of Systems  Cardiovascular: Positive for leg swelling.  Neurological:       Restless leg.   Objective:  BP 138/74 (BP Location: Left Arm, Patient Position: Sitting, Cuff Size: Normal)   Pulse 65   Temp 98 F (36.7 C) (Oral)   Resp 18   Wt 204 lb (92.5 kg)   SpO2 95%   BMI 29.27 kg/m   BP/Weight 07/18/2017 04/11/2017 0/25/8527  Systolic BP 782 423 536  Diastolic BP 74 77 93  Wt. (Lbs) 204 202.4 202  BMI 29.27 29.04 28.98    Physical Exam  Constitutional: He is oriented to person, place, and time. He appears well-developed. No distress.  Pulmonary/Chest: Effort normal and breath sounds  normal.  Musculoskeletal:  Right leg - patient has 2-3+ pitting edema which starts in the ankle and extends proximally to the knee. Size difference appreciated. Negative Homans sign.  Neurological: He is alert and oriented to person, place, and time.  Psychiatric: He has a normal mood and affect.  Vitals reviewed.   Lab Results  Component Value Date   WBC 7.8 10/17/2016   HGB 15.5 10/17/2016   HCT 46.3 10/17/2016   PLT 208.0 10/17/2016   GLUCOSE 101 (H) 10/17/2016   CHOL 124 12/10/2015   TRIG 75.0 12/10/2015   HDL 43.60 12/10/2015   LDLDIRECT 67.0 10/17/2016   LDLCALC 65 12/10/2015   ALT 29 10/17/2016   AST 27 10/17/2016   NA 142 10/17/2016   K 4.3 10/17/2016   CL 105 10/17/2016   CREATININE 0.98 10/17/2016   BUN 16 10/17/2016   CO2 30 10/17/2016   TSH 1.93 10/17/2016   HGBA1C 5.8 10/17/2016    Assessment & Plan:   Problem List Items Addressed This Visit    Edema of right lower extremity - Primary    New acute problem. Uncertain etiology at this time. Concern for DVT given exam findings. Arranging [place].       Relevant Orders   US Venous Img Lower Unilateral Right   RLS (restless legs syndrome)  New problem (to me). Trial of Gabapentin.        Meds ordered this encounter  Medications  . fluocinonide cream (LIDEX) 0.05 %    Sig: Apply 1 application topically 2 (two) times daily.  Marland Kitchen gabapentin (NEURONTIN) 300 MG capsule    Sig: Take 1 capsule (300 mg total) by mouth at bedtime.    Dispense:  30 capsule    Refill:  0   Follow-up: PRN  [place]

## 2017-07-18 NOTE — Telephone Encounter (Addendum)
Looks like lisinopril  previously prescribed by Dr Rockey Situ, advised patient refills on script Last office visit today acute 07/18/17  10/17/16 [facility] NOV [facility]

## 2017-07-18 NOTE — Telephone Encounter (Signed)
Re-sent script for gabapentin due to [facility]. Please disregard message in regards to refill for Lisinopril advised patient had refills at pharmacy.

## 2017-07-19 ENCOUNTER — Other Ambulatory Visit: Payer: Self-pay | Admitting: Internal Medicine

## 2017-07-23 ENCOUNTER — Other Ambulatory Visit: Payer: Self-pay | Admitting: Family Medicine

## 2017-07-24 ENCOUNTER — Other Ambulatory Visit: Payer: Self-pay

## 2017-07-24 ENCOUNTER — Telehealth: Payer: Self-pay | Admitting: *Deleted

## 2017-07-24 ENCOUNTER — Other Ambulatory Visit: Payer: Self-pay | Admitting: Internal Medicine

## 2017-07-24 MED ORDER — LISINOPRIL 20 MG PO TABS: ORAL_TABLET | ORAL | 1 refills | Status: DC

## 2017-07-24 NOTE — Telephone Encounter (Signed)
Pt has requested to have this medication sent to [facility] 90 day supply and a 7 day supply sent to [facility] [address] until [facility] delivers

## 2017-08-20 ENCOUNTER — Other Ambulatory Visit: Payer: Self-pay | Admitting: Internal Medicine

## 2017-08-23 ENCOUNTER — Ambulatory Visit (INDEPENDENT_AMBULATORY_CARE_PROVIDER_SITE_OTHER): Payer: Medicare Other

## 2017-08-23 ENCOUNTER — Ambulatory Visit (INDEPENDENT_AMBULATORY_CARE_PROVIDER_SITE_OTHER): Payer: Medicare Other | Admitting: Family

## 2017-08-23 ENCOUNTER — Encounter: Payer: Self-pay | Admitting: Family

## 2017-08-23 VITALS — BP 136/76 | HR 66 | Temp 97.7°F | Ht 70.0 in | Wt 265.0 lb

## 2017-08-23 DIAGNOSIS — M25572 Pain in left ankle and joints of left foot: Secondary | ICD-10-CM | POA: Diagnosis not present

## 2017-08-23 DIAGNOSIS — M7989 Other specified soft tissue disorders: Secondary | ICD-10-CM | POA: Diagnosis not present

## 2017-08-23 DIAGNOSIS — G8929 Other chronic pain: Secondary | ICD-10-CM

## 2017-08-23 DIAGNOSIS — M25571 Pain in right ankle and joints of right foot: Secondary | ICD-10-CM | POA: Diagnosis not present

## 2017-08-23 MED ORDER — DICLOFENAC SODIUM 1 % TD GEL: 4.0000 g | Freq: Four times a day (QID) | TRANSDERMAL | 3 refills | Status: DC

## 2017-08-23 NOTE — Progress Notes (Signed)
Subjective:    Patient ID: David Atkins, male    DOB: Nov 26, 1929, 83 y.o.   MRN: [id number]  CC: David Atkins is a 83 y.o. male who presents today for an acute visit.    HPI: CC: right ankle pain x one month, unchanged. No injury. Some pain in left, however primarily in left. Pain improves with compression stockings, ibuprofen,  and sauve.  Worsens with weather changes such as rain.  Swelling of RLE has improved with compression stockings.  Notes pain with walking in calves. No wound, sob, fever.      Seen a month ago in our clinic for RLE edema; [place] RLE  showed neg DVT   HISTORY:  Past Medical History:  Diagnosis Date  . 3-vessel coronary artery disease 2006   [place]  . basal cell   . BPH (benign prostatic hyperplasia)   . GERD (gastroesophageal reflux disease)   . Hyperlipidemia   . Hypertension   . Lumbar stenosis 12/03/13   MRI - lumbar pain with radiation down to LE  . Myocardial infarction ([place]) 2006  . OAB (overactive bladder)   . Osteoporosis 2010   by D[facility]A at [facility]  . Sleep apnea   . Spondylolisthesis of lumbar region 11/23/13   MRI Lumbar spine   Past Surgical History:  Procedure Laterality Date  . APPENDECTOMY  1951  . COLONOSCOPY    . COLONOSCOPY WITH PROPOFOL N/A 11/15/2015   Procedure: COLONOSCOPY WITH PROPOFOL;  Surgeon: Hulen Luster, MD;  Location: [facility] ENDOSCOPY;  Service: Gastroenterology;  Laterality: N/A;  . CORONARY ARTERY BYPASS GRAFT  2006   3 vessel, s/p AMI  . NASAL SEPTOPLASTY W/ TURBINOPLASTY  1981  . TONSILLECTOMY     Family History  Problem Relation Age of Onset  . Hyperlipidemia Mother   . Hypertension Mother   . Hypertension Father     Allergies: Sulfacetamide sodium and Sulfa antibiotics Current Outpatient Prescriptions on File Prior to Visit  Medication Sig Dispense Refill  . acetaminophen (TYLENOL) 325 MG tablet Take 650 mg by mouth 2 (two) times daily as needed.    Marland Kitchen aspirin 81 MG chewable tablet Chew 81 mg by mouth daily.      . Blood Pressure Monitoring (BLOOD PRESSURE MONITOR AUTOMAT) DEVI Use daily to check blood pressure 1 Device 0  . Calcium Carbonate-Vitamin D (CALCIUM + D PO) Take 1 tablet by mouth daily.     . Cholecalciferol (VITAMIN D-3) 1000 UNITS CAPS Take 1 capsule by mouth daily.    . finasteride (PROSCAR) 5 MG tablet Take 1 tablet (5 mg total) by mouth daily. 90 tablet 3  . fluocinonide cream (LIDEX) 0.09 % Apply 1 application topically 2 (two) times daily.    Marland Kitchen gabapentin (NEURONTIN) 300 MG capsule Take 1 capsule (300 mg total) by mouth at bedtime. 30 capsule 0  . gabapentin (NEURONTIN) 300 MG capsule TAKE 1 CAPSULE BY MOUTH AT  BEDTIME 30 capsule 1  . lisinopril (PRINIVIL,ZESTRIL) 20 MG tablet TAKE 1 TABLET (20 MG TOTAL) BY MOUTH DAILY. 90 tablet 1  . meclizine (ANTIVERT) 12.5 MG tablet TAKE 1 TABLET (12.5 MG TOTAL) BY MOUTH 3 (THREE) TIMES DAILY AS NEEDED. 30 tablet 1  . metoprolol tartrate (LOPRESSOR) 25 MG tablet TAKE 1/2 TABLET (25 MG TOTAL) BY MOUTH 2 (TWO) TIMES DAILY. 180 tablet 1  . omeprazole (PRILOSEC) 20 MG capsule TAKE 1 CAPSULE (20 MG TOTAL) BY MOUTH DAILY. 90 capsule 1  . polyethylene glycol powder (GLYCOLAX/MIRALAX) powder Take 17 g  by mouth once. Dissolve 1 capful of power into any liquid and drink once daily (Patient taking differently: Take 17 g by mouth 2 (two) times daily. Dissolve 1 capful of power into any liquid and drink once daily) 500 g 0  . simvastatin (ZOCOR) 20 MG tablet TAKE 1 TABLET BY MOUTH AT  BEDTIME 90 tablet 1  . tamsulosin (FLOMAX) 0.4 MG CAPS capsule TAKE 1 CAPSULE BY MOUTH DAILY AFTER BREAKFAST. 90 capsule 2  . tolterodine (DETROL LA) 4 MG 24 hr capsule Take 4 mg by mouth daily.     No current facility-administered medications on file prior to visit.     Social History  Substance Use Topics  . Smoking status: Former Smoker    Types: Cigarettes    Quit date: 07/10/1963  . Smokeless tobacco: Never Used  . Alcohol use No    Review of Systems  Constitutional:  Negative for chills and fever.  Respiratory: Negative for cough and shortness of breath.   Cardiovascular: Positive for leg swelling. Negative for chest pain and palpitations.  Gastrointestinal: Negative for nausea and vomiting.  Musculoskeletal: Negative for gait problem.      Objective:    BP 136/76   Pulse 66   Temp 97.7 F (36.5 C) (Oral)   Ht 5\' 10"  (1.778 m)   Wt 265 lb (120.2 kg)   SpO2 96%   BMI 38.02 kg/m    Physical Exam  Constitutional: He appears well-developed and well-nourished.  Cardiovascular: Regular rhythm and normal heart sounds.   Trace BLE edema. No palpable cords or masses. No erythema or increased warmth. No asymmetry in calf size when compared bilaterally LE hair growth symmetric and present. No discoloration of varicosities noted. LE warm and palpable pedal pulses.   Pulmonary/Chest: Effort normal and breath sounds normal. No respiratory distress. He has no wheezes. He has no rhonchi. He has no rales.  Musculoskeletal:       Right ankle: He exhibits normal range of motion, no swelling, no ecchymosis and no laceration. No tenderness.  Neurological: He is alert.  Skin: Skin is warm and dry.  Psychiatric: He has a normal mood and affect. His speech is normal and behavior is normal.  Vitals reviewed.      Assessment & Plan:   1. Chronic pain of both ankles Etiology nonspecific at this time. Working diagnosis includes etiology of pain in the arthritic in nature. Reassured of benign exam. Also have suspicion for intermittent claudication as patient reports pain in calf with walking. Refer to vascular further evaluation.Will also complete XR studies. Trial of voltaren gel; advised continued use of compression stocking.   - DG Ankle Complete Right - DG Ankle Complete Left - diclofenac sodium (VOLTAREN) 1 % GEL; Apply 4 g topically 4 (four) times daily.  Dispense: 1 Tube; Refill: 3 - Ambulatory referral to Vascular Surgery    I am having [name]  start on diclofenac sodium. I am also having him maintain his tolterodine, acetaminophen, Calcium Carbonate-Vitamin D (CALCIUM + D PO), Blood Pressure Monitor Automat, Vitamin D-3, polyethylene glycol powder, tamsulosin, aspirin, metoprolol tartrate, finasteride, omeprazole, simvastatin, fluocinonide cream, gabapentin, gabapentin, lisinopril, and meclizine.   Meds ordered this encounter  Medications  . diclofenac sodium (VOLTAREN) 1 % GEL    Sig: Apply 4 g topically 4 (four) times daily.    Dispense:  1 Tube    Refill:  3    Order Specific Question:   Supervising Provider    Answer:   Derrel Nip,  TERESA L [[id number]]    Return precautions given.   Risks, benefits, and alternatives of the medications and treatment plan prescribed today were discussed, and patient expressed understanding.   Education regarding symptom management and diagnosis given to patient on AVS.  Continue to follow with [name], MD for routine health maintenance.   [name] and I agreed with plan.   Mable Paris, FNP

## 2017-08-23 NOTE — Patient Instructions (Addendum)
xrays  Trial voltaren gel  Referral to vascular  Let me know if pain not better

## 2017-08-24 ENCOUNTER — Telehealth: Payer: Self-pay | Admitting: *Deleted

## 2017-08-24 DIAGNOSIS — G8929 Other chronic pain: Secondary | ICD-10-CM

## 2017-08-24 DIAGNOSIS — M25572 Pain in left ankle and joints of left foot: Principal | ICD-10-CM

## 2017-08-24 DIAGNOSIS — M25571 Pain in right ankle and joints of right foot: Principal | ICD-10-CM

## 2017-08-24 MED ORDER — DICLOFENAC SODIUM 1 % TD GEL: 4.0000 g | Freq: Four times a day (QID) | TRANSDERMAL | 3 refills | Status: DC

## 2017-08-24 NOTE — Telephone Encounter (Signed)
Pt requested to have the script for diclofenac sodium creme sent to [facility] [address] Pt contact [phone]

## 2017-08-24 NOTE — Telephone Encounter (Signed)
Is it ok to send this in? I see he has gotten the gel previously.

## 2017-08-24 NOTE — Telephone Encounter (Signed)
I took care of it.   Sent to [facility]

## 2017-08-25 ENCOUNTER — Telehealth (HOSPITAL_COMMUNITY): Payer: Self-pay | Admitting: Family

## 2017-08-25 DIAGNOSIS — M25471 Effusion, right ankle: Secondary | ICD-10-CM

## 2017-08-25 NOTE — Telephone Encounter (Signed)
closing

## 2017-08-27 ENCOUNTER — Telehealth: Payer: Self-pay | Admitting: *Deleted

## 2017-08-27 ENCOUNTER — Telehealth: Payer: Self-pay | Admitting: Internal Medicine

## 2017-08-27 ENCOUNTER — Encounter: Payer: Self-pay | Admitting: Family

## 2017-08-27 NOTE — Telephone Encounter (Signed)
See result note.  

## 2017-08-27 NOTE — Telephone Encounter (Signed)
Approvedtoday  Request Reference Number: [id number]. DICLOFENAC GEL 1% is approved through 12/03/2017. For further questions, call [phone]. Patient notified and voiced understanding.

## 2017-08-27 NOTE — Telephone Encounter (Signed)
Please advise 

## 2017-08-27 NOTE — Telephone Encounter (Signed)
Pt requested Xray results Pt contact [phone]

## 2017-08-31 ENCOUNTER — Ambulatory Visit (INDEPENDENT_AMBULATORY_CARE_PROVIDER_SITE_OTHER): Payer: Medicare Other | Admitting: Vascular Surgery

## 2017-08-31 ENCOUNTER — Encounter (INDEPENDENT_AMBULATORY_CARE_PROVIDER_SITE_OTHER): Payer: Self-pay | Admitting: Vascular Surgery

## 2017-08-31 VITALS — BP 144/85 | HR 73 | Resp 16 | Ht 70.0 in | Wt 206.0 lb

## 2017-08-31 DIAGNOSIS — R600 Localized edema: Secondary | ICD-10-CM

## 2017-08-31 DIAGNOSIS — G8929 Other chronic pain: Secondary | ICD-10-CM

## 2017-08-31 DIAGNOSIS — M79604 Pain in right leg: Secondary | ICD-10-CM

## 2017-08-31 DIAGNOSIS — E785 Hyperlipidemia, unspecified: Secondary | ICD-10-CM | POA: Diagnosis not present

## 2017-08-31 DIAGNOSIS — M79671 Pain in right foot: Secondary | ICD-10-CM | POA: Insufficient documentation

## 2017-08-31 NOTE — Progress Notes (Signed)
Subjective:    Patient ID: David Atkins, male    DOB: 1929-01-04, 83 y.o.   MRN: [id number] Chief Complaint  Patient presents with  . Leg Pain    ref [place] bil leg pain   Presents as a new patient referred by Dr. Vidal Schwalbe for "right angle pain". Patient endorses a history of right ankle pain which started "a few weeks ago". Patient says he underwent a DVT study completed by his PCP which was negative. Patient thinks it may be arthritis. This pain is also associated with some ankle swelling. Patient states that he stands at work for at least 5-6 hours a day. He does wear medical grade 1 compression stockings on a daily basis and elevates his legs heart level or higher. This seems to control the swelling in both his lower extremities. Patient denies any claudication, rest pain or ulceration to the lower extremity. Patient denies any fever, nausea or vomiting.   Review of Systems  Constitutional: Negative.   HENT: Negative.   Eyes: Negative.   Respiratory: Negative.   Cardiovascular: Positive for leg swelling.       Right ankle pain  Gastrointestinal: Negative.   Endocrine: Negative.   Genitourinary: Negative.   Musculoskeletal: Negative.   Skin: Negative.   Allergic/Immunologic: Negative.   Neurological: Negative.   Hematological: Negative.   Psychiatric/Behavioral: Negative.       Objective:   Physical Exam  Constitutional: He is oriented to person, place, and time. He appears well-developed and well-nourished.  HENT:  Head: Normocephalic and atraumatic.  Eyes: Pupils are equal, round, and reactive to light. Conjunctivae are normal.  Neck: Normal range of motion.  Cardiovascular: Normal rate, regular rhythm, normal heart sounds and intact distal pulses.   Pulses:      Radial pulses are 2+ on the right side, and 2+ on the left side.       Dorsalis pedis pulses are 2+ on the right side, and 2+ on the left side.       Posterior tibial pulses are 2+ on the right side, and 2+ on  the left side.  Pulmonary/Chest: Effort normal.  Musculoskeletal: Normal range of motion. He exhibits no edema.  Neurological: He is alert and oriented to person, place, and time.  Skin: Skin is warm and dry.  Psychiatric: He has a normal mood and affect. His behavior is normal. Judgment and thought content normal.  Vitals reviewed.   BP (!) 144/85 (BP Location: Right Arm)   Pulse 73   Resp 16   Ht 5\' 10"  (1.778 m)   Wt 206 lb (93.4 kg)   BMI 29.56 kg/m   Past Medical History:  Diagnosis Date  . 3-vessel coronary artery disease 2006   [place]  . basal cell   . BPH (benign prostatic hyperplasia)   . GERD (gastroesophageal reflux disease)   . Hyperlipidemia   . Hypertension   . Lumbar stenosis 12/03/13   MRI - lumbar pain with radiation down to LE  . Myocardial infarction ([place]) 2006  . OAB (overactive bladder)   . Osteoporosis 2010   by DEXA at [facility]  . Sleep apnea   . Spondylolisthesis of lumbar region 11/23/13   MRI Lumbar spine    Social History   Social History  . Marital status: Married    Spouse name: N/A  . Number of children: N/A  . Years of education: N/A   Occupational History  . Not on file.   Social History Main Topics  .  Smoking status: Former Smoker    Types: Cigarettes    Quit date: 07/10/1963  . Smokeless tobacco: Never Used  . Alcohol use No  . Drug use: No  . Sexual activity: Not Currently   Other Topics Concern  . Not on file   Social History Narrative  . No narrative on file    Past Surgical History:  Procedure Laterality Date  . APPENDECTOMY  1951  . COLONOSCOPY    . COLONOSCOPY WITH PROPOFOL N/A 11/15/2015   Procedure: COLONOSCOPY WITH PROPOFOL;  Surgeon: Hulen Luster, MD;  Location: [facility] ENDOSCOPY;  Service: Gastroenterology;  Laterality: N/A;  . CORONARY ARTERY BYPASS GRAFT  2006   3 vessel, s/p AMI  . NASAL SEPTOPLASTY W/ TURBINOPLASTY  1981  . TONSILLECTOMY      Family History  Problem Relation Age of Onset  .  Hyperlipidemia Mother   . Hypertension Mother   . Hypertension Father     Allergies  Allergen Reactions  . Sulfacetamide Sodium   . Sulfa Antibiotics Rash       Assessment & Plan:  Presents as a new patient referred by Dr. Vidal Schwalbe for "right angle pain". Patient endorses a history of right ankle pain which started "a few weeks ago". Patient says he underwent a DVT study completed by his PCP which was negative. Patient thinks it may be arthritis. This pain is also associated with some ankle swelling. Patient states that he stands at work for at least 5-6 hours a day. He does wear medical grade 1 compression stockings on a daily basis and elevates his legs heart level or higher. This seems to control the swelling in both his lower extremities. Patient denies any claudication, rest pain or ulceration to the lower extremity. Patient denies any fever, nausea or vomiting.  1. Chronic pain of right lower extremity - new Recent right ankle pain which started a few weeks ago Patient has risk factors for peripheral artery disease Most likely arthritic in nature however would like to have the patient undergo an ABI to rule out any peripheral artery disease that may be contributing to his discomfort  - VAS [place] ABI WITH/WO TBI; Future  2. Hyperlipidemia LDL goal <70 - Stable Encouraged good control as its slows the progression of atherosclerotic disease  3. Edema of right lower extremity - stable Patient has an wearing medical grade 1 compression stockings and elevating his la daily basis As per the patient and on physical exam this seems to be controlling nicely. He should continue to do this on a daily basis   Current Outpatient Prescriptions on File Prior to Visit  Medication Sig Dispense Refill  . acetaminophen (TYLENOL) 325 MG tablet Take 650 mg by mouth 2 (two) times daily as needed.    Marland Kitchen aspirin 81 MG chewable tablet Chew 81 mg by mouth daily.    . Blood Pressure Monitoring (BLOOD PRESSURE  MONITOR AUTOMAT) DEVI Use daily to check blood pressure 1 Device 0  . Calcium Carbonate-Vitamin D (CALCIUM + D PO) Take 1 tablet by mouth daily.     . Cholecalciferol (VITAMIN D-3) 1000 UNITS CAPS Take 1 capsule by mouth daily.    . diclofenac sodium (VOLTAREN) 1 % GEL Apply 4 g topically 4 (four) times daily. 1 Tube 3  . finasteride (PROSCAR) 5 MG tablet Take 1 tablet (5 mg total) by mouth daily. 90 tablet 3  . fluocinonide cream (LIDEX) 6.30 % Apply 1 application topically 2 (two) times daily.    Marland Kitchen  gabapentin (NEURONTIN) 300 MG capsule Take 1 capsule (300 mg total) by mouth at bedtime. 30 capsule 0  . gabapentin (NEURONTIN) 300 MG capsule TAKE 1 CAPSULE BY MOUTH AT  BEDTIME 30 capsule 1  . lisinopril (PRINIVIL,ZESTRIL) 20 MG tablet TAKE 1 TABLET (20 MG TOTAL) BY MOUTH DAILY. 90 tablet 1  . meclizine (ANTIVERT) 12.5 MG tablet TAKE 1 TABLET (12.5 MG TOTAL) BY MOUTH 3 (THREE) TIMES DAILY AS NEEDED. 30 tablet 1  . metoprolol tartrate (LOPRESSOR) 25 MG tablet TAKE 1/2 TABLET (25 MG TOTAL) BY MOUTH 2 (TWO) TIMES DAILY. 180 tablet 1  . omeprazole (PRILOSEC) 20 MG capsule TAKE 1 CAPSULE (20 MG TOTAL) BY MOUTH DAILY. 90 capsule 1  . polyethylene glycol powder (GLYCOLAX/MIRALAX) powder Take 17 g by mouth once. Dissolve 1 capful of power into any liquid and drink once daily (Patient taking differently: Take 17 g by mouth 2 (two) times daily. Dissolve 1 capful of power into any liquid and drink once daily) 500 g 0  . simvastatin (ZOCOR) 20 MG tablet TAKE 1 TABLET BY MOUTH AT  BEDTIME 90 tablet 1  . tamsulosin (FLOMAX) 0.4 MG CAPS capsule TAKE 1 CAPSULE BY MOUTH DAILY AFTER BREAKFAST. 90 capsule 2  . tolterodine (DETROL LA) 4 MG 24 hr capsule Take 4 mg by mouth daily.     No current facility-administered medications on file prior to visit.     There are no Patient Instructions on file for this visit. No Follow-up on file.   KIMBERLY A STEGMAYER, PA-C

## 2017-09-05 DIAGNOSIS — G4733 Obstructive sleep apnea (adult) (pediatric): Secondary | ICD-10-CM | POA: Diagnosis not present

## 2017-09-26 ENCOUNTER — Other Ambulatory Visit: Payer: Self-pay | Admitting: Family Medicine

## 2017-10-01 ENCOUNTER — Encounter (INDEPENDENT_AMBULATORY_CARE_PROVIDER_SITE_OTHER): Payer: Self-pay | Admitting: Vascular Surgery

## 2017-10-01 ENCOUNTER — Ambulatory Visit (INDEPENDENT_AMBULATORY_CARE_PROVIDER_SITE_OTHER): Payer: Medicare Other

## 2017-10-01 ENCOUNTER — Ambulatory Visit (INDEPENDENT_AMBULATORY_CARE_PROVIDER_SITE_OTHER): Payer: Medicare Other | Admitting: Vascular Surgery

## 2017-10-01 VITALS — BP 150/75 | HR 62 | Resp 16 | Ht 70.0 in | Wt 204.0 lb

## 2017-10-01 DIAGNOSIS — M79604 Pain in right leg: Secondary | ICD-10-CM

## 2017-10-01 DIAGNOSIS — I10 Essential (primary) hypertension: Secondary | ICD-10-CM | POA: Diagnosis not present

## 2017-10-01 DIAGNOSIS — G8929 Other chronic pain: Secondary | ICD-10-CM

## 2017-10-01 DIAGNOSIS — E785 Hyperlipidemia, unspecified: Secondary | ICD-10-CM

## 2017-10-01 NOTE — Progress Notes (Signed)
Subjective:    Patient ID: David Atkins, male    DOB: 06-29-1929, 83 y.o.   MRN: [id number] Chief Complaint  Patient presents with  . Follow-up     pt conv abi   Patient presents to review vascular studies. He was last seen approximately one month ago complaining of right ankle pain. The patient's symptoms are stable and he continues to experience this pain. Denies any claudication-like symptoms, rest pain or ulceration to the lower extremity. The patient underwent a bilateral ABI which was notable for noncompressible vessels is likely due to medial calcification. The bilateral toe brachial indices are normal. Bilateral triphasic tibials. Normal waveforms noted. There is no previous for comparison. The patient denies any fever, nausea or vomiting.   Review of Systems  Constitutional: Negative.   HENT: Negative.   Eyes: Negative.   Respiratory: Negative.   Cardiovascular:       Right ankle pain  Gastrointestinal: Negative.   Endocrine: Negative.   Genitourinary: Negative.   Musculoskeletal: Negative.   Skin: Negative.   Allergic/Immunologic: Negative.   Neurological: Negative.   Hematological: Negative.   Psychiatric/Behavioral: Negative.       Objective:   Physical Exam  Constitutional: He is oriented to person, place, and time. He appears well-developed and well-nourished. No distress.  HENT:  Head: Normocephalic and atraumatic.  Eyes: Pupils are equal, round, and reactive to light. Conjunctivae are normal.  Neck: Normal range of motion.  Cardiovascular: Normal rate, regular rhythm, normal heart sounds and intact distal pulses.   Pulses:      Radial pulses are 2+ on the right side, and 2+ on the left side.       Dorsalis pedis pulses are 2+ on the right side, and 2+ on the left side.       Posterior tibial pulses are 2+ on the right side, and 2+ on the left side.  Pulmonary/Chest: Effort normal.  Musculoskeletal: Normal range of motion. He exhibits no edema.    Neurological: He is alert and oriented to person, place, and time.  Skin: Skin is warm and dry. He is not diaphoretic.  Psychiatric: He has a normal mood and affect. His behavior is normal. Judgment and thought content normal.  Vitals reviewed.  BP (!) 150/75 (BP Location: Right Arm)   Pulse 62   Resp 16   Ht 5\' 10"  (1.778 m)   Wt 204 lb (92.5 kg)   BMI 29.27 kg/m   Past Medical History:  Diagnosis Date  . 3-vessel coronary artery disease 2006   [place]  . basal cell   . BPH (benign prostatic hyperplasia)   . GERD (gastroesophageal reflux disease)   . Hyperlipidemia   . Hypertension   . Lumbar stenosis 12/03/13   MRI - lumbar pain with radiation down to LE  . Myocardial infarction ([place]) 2006  . OAB (overactive bladder)   . Osteoporosis 2010   by [facility] at [facility]  . Sleep apnea   . Spondylolisthesis of lumbar region 11/23/13   MRI Lumbar spine   Social History   Social History  . Marital status: Married    Spouse name: N/A  . Number of children: N/A  . Years of education: N/A   Occupational History  . Not on file.   Social History Main Topics  . Smoking status: Former Smoker    Types: Cigarettes    Quit date: 07/10/1963  . Smokeless tobacco: Never Used  . Alcohol use No  . Drug use: No  .  Sexual activity: Not Currently   Other Topics Concern  . Not on file   Social History Narrative  . No narrative on file   Past Surgical History:  Procedure Laterality Date  . APPENDECTOMY  1951  . COLONOSCOPY    . COLONOSCOPY WITH PROPOFOL N/A 11/15/2015   Procedure: COLONOSCOPY WITH PROPOFOL;  Surgeon: Hulen Luster, MD;  Location[facility] ENDOSCOPY;  Service: Gastroenterology;  Laterality: N/A;  . CORONARY ARTERY BYPASS GRAFT  2006   3 vessel, s/p AMI  . NASAL SEPTOPLASTY W/ TURBINOPLASTY  1981  . TONSILLECTOMY     Family History  Problem Relation Age of Onset  . Hyperlipidemia Mother   . Hypertension Mother   . Hypertension Father     Allergies  Allergen Reactions   . Sulfacetamide Sodium   . Sulfa Antibiotics Rash      Assessment & Plan:  Patient presents to review vascular studies. He was last seen approximately one month ago complaining of right ankle pain. The patient's symptoms are stable and he continues to experience this pain. Denies any claudication-like symptoms, rest pain or ulceration to the lower extremity. The patient underwent a bilateral ABI which was notable for noncompressible vessels is likely due to medial calcification. The bilateral toe brachial indices are normal. Bilateral triphasic tibials. Normal waveforms noted. There is no previous for comparison. The patient denies any fever, nausea or vomiting.  1. Chronic pain of right lower extremity - Stable Patient with chronic right ankle/right lower extremity pain. Physical exam is unremarkable ABI with normal waveforms and triphasic blood flow to the bilateral tibials. Patient's discomfort could be degenerative joint disease or osteoarthritis. No intervention is indicated at this time. Patient with risk factors for peripheral artery disease. Recommend yearly surveillance with ABI Patient his understanding  - VAS [place] ABI WITH/WO TBI; Future  2. Hyperlipidemia LDL goal <70 - Stable Encouraged good control as its slows the progression of atherosclerotic disease  3. Essential hypertension - Stable Encouraged good control as its slows the progression of atherosclerotic disease  Current Outpatient Prescriptions on File Prior to Visit  Medication Sig Dispense Refill  . acetaminophen (TYLENOL) 325 MG tablet Take 650 mg by mouth 2 (two) times daily as needed.    Marland Kitchen aspirin 81 MG chewable tablet Chew 81 mg by mouth daily.    . Blood Pressure Monitoring (BLOOD PRESSURE MONITOR AUTOMAT) DEVI Use daily to check blood pressure 1 Device 0  . Calcium Carbonate-Vitamin D (CALCIUM + D PO) Take 1 tablet by mouth daily.     . Cholecalciferol (VITAMIN D-3) 1000 UNITS CAPS Take 1 capsule by mouth  daily.    . diclofenac sodium (VOLTAREN) 1 % GEL Apply 4 g topically 4 (four) times daily. 1 Tube 3  . finasteride (PROSCAR) 5 MG tablet Take 1 tablet (5 mg total) by mouth daily. 90 tablet 3  . fluocinonide cream (LIDEX) 6.01 % Apply 1 application topically 2 (two) times daily.    Marland Kitchen gabapentin (NEURONTIN) 300 MG capsule TAKE 1 CAPSULE BY MOUTH AT  BEDTIME 30 capsule 1  . gabapentin (NEURONTIN) 300 MG capsule TAKE 1 CAPSULE (300 MG TOTAL) BY MOUTH AT BEDTIME. 30 capsule 0  . lisinopril (PRINIVIL,ZESTRIL) 20 MG tablet TAKE 1 TABLET (20 MG TOTAL) BY MOUTH DAILY. 90 tablet 1  . meclizine (ANTIVERT) 12.5 MG tablet TAKE 1 TABLET (12.5 MG TOTAL) BY MOUTH 3 (THREE) TIMES DAILY AS NEEDED. 30 tablet 1  . metoprolol tartrate (LOPRESSOR) 25 MG tablet TAKE 1/2 TABLET (  25 MG TOTAL) BY MOUTH 2 (TWO) TIMES DAILY. 180 tablet 1  . omeprazole (PRILOSEC) 20 MG capsule TAKE 1 CAPSULE (20 MG TOTAL) BY MOUTH DAILY. 90 capsule 1  . polyethylene glycol powder (GLYCOLAX/MIRALAX) powder Take 17 g by mouth once. Dissolve 1 capful of power into any liquid and drink once daily (Patient taking differently: Take 17 g by mouth 2 (two) times daily. Dissolve 1 capful of power into any liquid and drink once daily) 500 g 0  . simvastatin (ZOCOR) 20 MG tablet TAKE 1 TABLET BY MOUTH AT  BEDTIME 90 tablet 1  . tamsulosin (FLOMAX) 0.4 MG CAPS capsule TAKE 1 CAPSULE BY MOUTH DAILY AFTER BREAKFAST. 90 capsule 2  . tolterodine (DETROL LA) 4 MG 24 hr capsule Take 4 mg by mouth daily.     No current facility-administered medications on file prior to visit.     There are no Patient Instructions on file for this visit. No Follow-up on file.   Cherine Drumgoole A Haiden Clucas, PA-C

## 2017-10-07 NOTE — Progress Notes (Deleted)
Home Cardiology Office Note  Date:  10/07/2017   ID:  David Atkins, DOB [birth year], MRN [id number]  PCP:  [name], MD   No chief complaint on file.   HPI:  David Atkins is a very pleasant 83 year old gentleman with a history of  coronary artery disease,  bypass surgery in 2006 at [facility] in [place],  mild carotid arterial disease by report,  hyperlipidemia,  small smoking history in the remote past,  aortic athero on CT 08/2018 who presents for routine followup of his coronary artery disease .   patient of Dr. Derrel Nip.   active, works in his workshop Does his chores,including his shopping No regular exercise program  No new complaints,  no chest pain concerning for angina, no significant shortness of breath on exertion  Lab work reviewed with him in detail Total chol 124,  LDL 67  EKG personally reviewed by myself on todays visit shows normal sinus rhythm with rate 66 bpm, no significant ST or T-wave changes  Other past medical history  01/06/2014, he reported that he had general malaise, blood pressure checked showed systolic pressure greater than 200. He went to the emergency room for further evaluation. Workup there was negative including normal basic metabolic panel, negative cardiac enzymes. CT scan showed chronic microvascular changes in the deep white matter, and he was discharged home after medical management of his blood pressure. On a previous visit, his Son was concerned about various arguments he may be having with other family members at home and the stress this may be causing him.    PMH:   has a past medical history of 3-vessel coronary artery disease (2006), basal cell, BPH (benign prostatic hyperplasia), GERD (gastroesophageal reflux disease), Hyperlipidemia, Hypertension, Lumbar stenosis (12/03/13), Myocardial infarction ([place]) (2006), OAB (overactive bladder), Osteoporosis (2010), Sleep apnea, and Spondylolisthesis of lumbar  region (11/23/13).  PSH:    Past Surgical History:  Procedure Laterality Date  . APPENDECTOMY  1951  . COLONOSCOPY    . CORONARY ARTERY BYPASS GRAFT  2006   3 vessel, s/p AMI  . NASAL SEPTOPLASTY W/ TURBINOPLASTY  1981  . TONSILLECTOMY      Current Outpatient Medications  Medication Sig Dispense Refill  . acetaminophen (TYLENOL) 325 MG tablet Take 650 mg by mouth 2 (two) times daily as needed.    Marland Kitchen aspirin 81 MG chewable tablet Chew 81 mg by mouth daily.    . Blood Pressure Monitoring (BLOOD PRESSURE MONITOR AUTOMAT) DEVI Use daily to check blood pressure 1 Device 0  . Calcium Carbonate-Vitamin D (CALCIUM + D PO) Take 1 tablet by mouth daily.     . Cholecalciferol (VITAMIN D-3) 1000 UNITS CAPS Take 1 capsule by mouth daily.    . diclofenac sodium (VOLTAREN) 1 % GEL Apply 4 g topically 4 (four) times daily. 1 Tube 3  . finasteride (PROSCAR) 5 MG tablet Take 1 tablet (5 mg total) by mouth daily. 90 tablet 3  . fluocinonide cream (LIDEX) 9.52 % Apply 1 application topically 2 (two) times daily.    Marland Kitchen gabapentin (NEURONTIN) 300 MG capsule TAKE 1 CAPSULE BY MOUTH AT  BEDTIME 30 capsule 1  . gabapentin (NEURONTIN) 300 MG capsule TAKE 1 CAPSULE (300 MG TOTAL) BY MOUTH AT BEDTIME. 30 capsule 0  . lisinopril (PRINIVIL,ZESTRIL) 20 MG tablet TAKE 1 TABLET (20 MG TOTAL) BY MOUTH DAILY. 90 tablet 1  . meclizine (ANTIVERT) 12.5 MG tablet TAKE 1 TABLET (12.5 MG TOTAL) BY MOUTH 3 (THREE) TIMES DAILY AS  NEEDED. 30 tablet 1  . metoprolol tartrate (LOPRESSOR) 25 MG tablet TAKE 1/2 TABLET (25 MG TOTAL) BY MOUTH 2 (TWO) TIMES DAILY. 180 tablet 1  . omeprazole (PRILOSEC) 20 MG capsule TAKE 1 CAPSULE (20 MG TOTAL) BY MOUTH DAILY. 90 capsule 1  . polyethylene glycol powder (GLYCOLAX/MIRALAX) powder Take 17 g by mouth once. Dissolve 1 capful of power into any liquid and drink once daily (Patient taking differently: Take 17 g by mouth 2 (two) times daily. Dissolve 1 capful of power into any liquid and drink once  daily) 500 g 0  . simvastatin (ZOCOR) 20 MG tablet TAKE 1 TABLET BY MOUTH AT  BEDTIME 90 tablet 1  . tamsulosin (FLOMAX) 0.4 MG CAPS capsule TAKE 1 CAPSULE BY MOUTH DAILY AFTER BREAKFAST. 90 capsule 2  . tolterodine (DETROL LA) 4 MG 24 hr capsule Take 4 mg by mouth daily.     No current facility-administered medications for this visit.      Allergies:   Sulfacetamide sodium and Sulfa antibiotics   Social History:  The patient  reports that he quit smoking about 54 years ago. His smoking use included cigarettes. he has never used smokeless tobacco. He reports that he does not drink alcohol or use drugs.   Family History:   family history includes Hyperlipidemia in his mother; Hypertension in his father and mother.    Review of Systems: Review of Systems  Constitutional: Negative.   Respiratory: Negative.   Cardiovascular: Negative.   Gastrointestinal: Negative.   Musculoskeletal: Positive for back pain.  Neurological: Negative.   Psychiatric/Behavioral: Negative.   All other systems reviewed and are negative.    PHYSICAL EXAM: VS:  There were no vitals taken for this visit. , BMI There is no height or weight on file to calculate BMI. GEN: Well nourished, well developed, in no acute distress  HEENT: normal  Neck: no JVD, carotid bruits, or masses Cardiac: RRR; no murmurs, rubs, or gallops,no edema  Respiratory:  clear to auscultation bilaterally, normal work of breathing GI: soft, nontender, nondistended, + BS MS: no deformity or atrophy  Skin: warm and dry, no rash Neuro:  Strength and sensation are intact Psych: euthymic mood, full affect    Recent Labs: 10/17/2016: ALT 29; BUN 16; Creatinine, Ser 0.98; Hemoglobin 15.5; Platelets 208.0; Potassium 4.3; Sodium 142; TSH 1.93    Lipid Panel Lab Results  Component Value Date   CHOL 124 12/10/2015   HDL 43.60 12/10/2015   LDLCALC 65 12/10/2015   TRIG 75.0 12/10/2015      Wt Readings from Last 3 Encounters:   10/01/17 204 lb (92.5 kg)  08/31/17 206 lb (93.4 kg)  08/23/17 265 lb (120.2 kg)       ASSESSMENT AND PLAN:  3-vessel coronary artery disease - Plan: EKG 12-Lead No changes to his medication regiment, currently asymptomatic No further testing at this time  Essential hypertension - Plan: EKG 12-Lead Blood pressure is well controlled on today's visit. No changes made to the medications.  Hyperlipidemia LDL goal <70 Cholesterol is at goal on the current lipid regimen. No changes to the medications were made.  Bilateral carotid artery stenosis History of mild bilateral carotid disease Cholesterol at goal  Hx of CABG Stable,  no new testing   Total encounter time more than 15 minutes  Greater than 50% was spent in counseling and coordination of care with the patient   Disposition:   F/U  6 months   No orders of the defined types  were placed in this encounter.    Signed, Esmond Plants, M.D., Ph.D. 10/07/2017  [place], [place]

## 2017-10-10 ENCOUNTER — Ambulatory Visit: Payer: Medicare Other

## 2017-10-11 ENCOUNTER — Ambulatory Visit: Payer: Medicare Other | Admitting: Cardiovascular Disease

## 2017-10-11 ENCOUNTER — Ambulatory Visit: Payer: Medicare Other | Admitting: Family

## 2017-10-11 VITALS — BP 152/66 | HR 71 | Temp 97.4°F | Ht 70.0 in | Wt 205.0 lb

## 2017-10-11 DIAGNOSIS — M79671 Pain in right foot: Secondary | ICD-10-CM

## 2017-10-11 DIAGNOSIS — R42 Dizziness and giddiness: Secondary | ICD-10-CM | POA: Diagnosis not present

## 2017-10-11 MED ORDER — MECLIZINE HCL 12.5 MG PO TABS: ORAL_TABLET | ORAL | 1 refills | Status: DC

## 2017-10-11 MED ORDER — MELOXICAM 7.5 MG PO TABS: 7.5000 mg | ORAL_TABLET | Freq: Every day | ORAL | 1 refills | Status: DC

## 2017-10-11 NOTE — Patient Instructions (Addendum)
Trial of mobic Heat/ice  Not particularly orthostatic today.  Let us know if dizziness worsens in any way; careful when going to stand.   Follow up with PCP.

## 2017-10-11 NOTE — Assessment & Plan Note (Addendum)
Reassured by normal neurologic exam. Patient states dizziness feels similar to episodes in past. Declines MRI Brain. Not particularly orthostatic. Refilled meclizine as requested .Return precautions given.

## 2017-10-11 NOTE — Progress Notes (Signed)
Pre visit review using our clinic review tool, if applicable. No additional management support is needed unless otherwise documented below in the visit note. 

## 2017-10-11 NOTE — Assessment & Plan Note (Signed)
Unchanged. Working diagnosis of osteoarthritis. Pleased to see improvement with better shoe support. We'll trial Mobic. Patient will let us know if not better.

## 2017-10-11 NOTE — Progress Notes (Signed)
Subjective:    Patient ID: David Atkins, male    DOB: [birth year], 83 y.o.   MRN: [id number]  CC: David Atkins is a 83 y.o. male who presents today for follow up.   HPI: Right ankle pain continues, unchanged. No pain when still. In the morning when wakes up, notes pain when first gets up, improves once 'gets going in the morning.' NO LE swelling, numbness, tingling. Wearing compression stockings. No pain in hands, wrists, fingers.   Has recently bought new sneakers, with improvement of pain. Using voltaren gel and thinks helping a little.  HTN- takes 12.5mg  metoprolol daily and then the lisinopril prn is > 150. This morning has taken the metoprolol only. Denies exertional chest pain or pressure, numbness or tingling radiating to left arm or jaw, palpitations, dizziness, frequent headaches, changes in vision, or shortness of breath.   Out of meclizine and would like refill. 'Little dizzy today'. Triggered by changing positions, has 'to do it slowly.' vertigo, HA, N, V, syncope, exertional chest pain or pressure, numbness or tingling radiating to left arm or jaw, palpitations,  frequent headaches, changes in vision, or shortness of breath.  H/o carotid artery stenosis Follows with cardiologist Rockey Situ, has carotid US every year- last [place] 2017.    Patient has seen vascular  Since last visit. ABI with normal waveforms. Also suspect degenerative joint disease. HISTORY:  Past Medical History:  Diagnosis Date  . 3-vessel coronary artery disease 2006   [place]  . basal cell   . BPH (benign prostatic hyperplasia)   . GERD (gastroesophageal reflux disease)   . Hyperlipidemia   . Hypertension   . Lumbar stenosis 12/03/13   MRI - lumbar pain with radiation down to LE  . Myocardial infarction ([place]) 2006  . OAB (overactive bladder)   . Osteoporosis 2010   [facility]  . Sleep apnea   . Spondylolisthesis of lumbar region 11/23/13   MRI Lumbar spine   Past Surgical History:  Procedure  Laterality Date  . APPENDECTOMY  1951  . COLONOSCOPY    . CORONARY ARTERY BYPASS GRAFT  2006   3 vessel, s/p AMI  . NASAL SEPTOPLASTY W/ TURBINOPLASTY  1981  . TONSILLECTOMY     Family History  Problem Relation Age of Onset  . Hyperlipidemia Mother   . Hypertension Mother   . Hypertension Father     Allergies: Sulfacetamide sodium and Sulfa antibiotics Current Outpatient Medications on File Prior to Visit  Medication Sig Dispense Refill  . acetaminophen (TYLENOL) 325 MG tablet Take 650 mg by mouth 2 (two) times daily as needed.    Marland Kitchen aspirin 81 MG chewable tablet Chew 81 mg by mouth daily.    . Blood Pressure Monitoring (BLOOD PRESSURE MONITOR AUTOMAT) DEVI Use daily to check blood pressure 1 Device 0  . Calcium Carbonate-Vitamin D (CALCIUM + D PO) Take 1 tablet by mouth daily.     . Cholecalciferol (VITAMIN D-3) 1000 UNITS CAPS Take 1 capsule by mouth daily.    . diclofenac sodium (VOLTAREN) 1 % GEL Apply 4 g topically 4 (four) times daily. 1 Tube 3  . finasteride (PROSCAR) 5 MG tablet Take 1 tablet (5 mg total) by mouth daily. 90 tablet 3  . fluocinonide cream (LIDEX) 9.62 % Apply 1 application topically 2 (two) times daily.    Marland Kitchen gabapentin (NEURONTIN) 300 MG capsule TAKE 1 CAPSULE BY MOUTH AT  BEDTIME 30 capsule 1  . gabapentin (NEURONTIN) 300 MG capsule TAKE 1 CAPSULE (  300 MG TOTAL) BY MOUTH AT BEDTIME. 30 capsule 0  . lisinopril (PRINIVIL,ZESTRIL) 20 MG tablet TAKE 1 TABLET (20 MG TOTAL) BY MOUTH DAILY. 90 tablet 1  . metoprolol tartrate (LOPRESSOR) 25 MG tablet TAKE 1/2 TABLET (25 MG TOTAL) BY MOUTH 2 (TWO) TIMES DAILY. 180 tablet 1  . omeprazole (PRILOSEC) 20 MG capsule TAKE 1 CAPSULE (20 MG TOTAL) BY MOUTH DAILY. 90 capsule 1  . polyethylene glycol powder (GLYCOLAX/MIRALAX) powder Take 17 g by mouth once. Dissolve 1 capful of power into any liquid and drink once daily (Patient taking differently: Take 17 g by mouth 2 (two) times daily. Dissolve 1 capful of power into any liquid  and drink once daily) 500 g 0  . simvastatin (ZOCOR) 20 MG tablet TAKE 1 TABLET BY MOUTH AT  BEDTIME 90 tablet 1  . tamsulosin (FLOMAX) 0.4 MG CAPS capsule TAKE 1 CAPSULE BY MOUTH DAILY AFTER BREAKFAST. 90 capsule 2  . tolterodine (DETROL LA) 4 MG 24 hr capsule Take 4 mg by mouth daily.     No current facility-administered medications on file prior to visit.     Social History   Tobacco Use  . Smoking status: Former Smoker    Types: Cigarettes    Last attempt to quit: 07/10/1963    Years since quitting: 54.2  . Smokeless tobacco: Never Used  Substance Use Topics  . Alcohol use: No  . Drug use: No    Review of Systems  Constitutional: Negative for chills and fever.  Eyes: Negative for visual disturbance.  Respiratory: Negative for cough.   Cardiovascular: Positive for leg swelling. Negative for chest pain and palpitations.  Gastrointestinal: Negative for nausea and vomiting.  Neurological: Positive for dizziness. Negative for syncope, weakness and headaches.      Objective:    BP (!) 152/66   Pulse 71   Temp (!) 97.4 F (36.3 C) (Oral)   Ht 5\' 10"  (1.778 m)   Wt 205 lb (93 kg)   SpO2 97%   BMI 29.41 kg/m  BP Readings from Last 3 Encounters:  10/11/17 (!) 152/66  10/01/17 (!) 150/75  08/31/17 (!) 144/85   Wt Readings from Last 3 Encounters:  10/11/17 205 lb (93 kg)  10/01/17 204 lb (92.5 kg)  08/31/17 206 lb (93.4 kg)    Physical Exam  Constitutional: He appears well-developed and well-nourished.  HENT:  Right Ear: Hearing normal.  Left Ear: Hearing normal.  Mouth/Throat: Uvula is midline, oropharynx is clear and moist and mucous membranes are normal. No posterior oropharyngeal edema or posterior oropharyngeal erythema.  Eyes: Conjunctivae, EOM and lids are normal. Pupils are equal, round, and reactive to light. Lids are everted and swept, no foreign bodies found.  Normal fundus bilaterally.  Cardiovascular: Regular rhythm and normal heart sounds.  Wearing  compression stockings. No LE edema, palpable cords or masses. No erythema or increased warmth. No asymmetry in calf size when compared bilaterally LE hair growth symmetric and present. No discoloration of varicosities noted. LE warm and palpable pedal pulses.   Pulmonary/Chest: Effort normal and breath sounds normal. No respiratory distress. He has no wheezes. He has no rhonchi. He has no rales.  Musculoskeletal:       Right ankle: He exhibits normal range of motion, no swelling, no ecchymosis, no deformity, no laceration and normal pulse. Tenderness. Lateral malleolus tenderness found.  No pain with Squeeze test at mid calf.  Able to plantar and dorsi flex without pain.  Sensation intact equally bilateral lower  extremities. Palpable pedal pulses.   Lymphadenopathy:       Head (right side): No submental, no submandibular, no tonsillar, no preauricular, no posterior auricular and no occipital adenopathy present.       Head (left side): No submental, no submandibular, no tonsillar, no preauricular, no posterior auricular and no occipital adenopathy present.    He has no cervical adenopathy.  Neurological: He is alert. He has normal strength. No cranial nerve deficit or sensory deficit. He displays a negative Romberg sign.  Reflex Scores:      Bicep reflexes are 2+ on the right side and 2+ on the left side.      Patellar reflexes are 2+ on the right side and 2+ on the left side. Grip equal and strong bilateral upper extremities. Gait strong and steady. Able to perform rapid alternating movement without difficulty.  Skin: Skin is warm and dry.  Psychiatric: He has a normal mood and affect. His speech is normal and behavior is normal.  Vitals reviewed.      Assessment & Plan:   Problem List Items Addressed This Visit      Other   Right foot pain - Primary    Unchanged. Working diagnosis of osteoarthritis. Pleased to see improvement with better shoe support. We'll trial Mobic. Patient will  let us know if not better.      Relevant Medications   meloxicam (MOBIC) 7.5 MG tablet   Dizziness    Reassured by normal neurologic exam. Patient states dizziness feels similar to episodes in past. Declines MRI Brain. Not particularly orthostatic. Refilled meclizine as requested .Return precautions given.      Relevant Medications   meclizine (ANTIVERT) 12.5 MG tablet       I am having David Atkins start on meloxicam. I am also having him maintain his tolterodine, acetaminophen, Calcium Carbonate-Vitamin D (CALCIUM + D PO), Blood Pressure Monitor Automat, Vitamin D-3, polyethylene glycol powder, tamsulosin, aspirin, metoprolol tartrate, finasteride, omeprazole, simvastatin, fluocinonide cream, gabapentin, lisinopril, diclofenac sodium, gabapentin, and meclizine.   Meds ordered this encounter  Medications  . meloxicam (MOBIC) 7.5 MG tablet    Sig: Take 1 tablet (7.5 mg total) daily by mouth.    Dispense:  30 tablet    Refill:  1    Order Specific Question:   Supervising Provider    Answer:   Deborra Medina L [[id number]]  . meclizine (ANTIVERT) 12.5 MG tablet    Sig: TAKE 1 TABLET (12.5 MG TOTAL) BY MOUTH 3 (THREE) TIMES DAILY AS NEEDED.    Dispense:  30 tablet    Refill:  1    Order Specific Question:   Supervising Provider    Answer:   [name] [[id number]]    Return precautions given.   Risks, benefits, and alternatives of the medications and treatment plan prescribed today were discussed, and patient expressed understanding.   Education regarding symptom management and diagnosis given to patient on AVS.  Continue to follow with [name], MD for routine health maintenance.   David Atkins and I agreed with plan.   Mable Paris, FNP

## 2017-10-12 ENCOUNTER — Ambulatory Visit: Payer: Medicare Other | Admitting: Urology

## 2017-10-22 ENCOUNTER — Encounter: Payer: Self-pay | Admitting: Urology

## 2017-10-22 ENCOUNTER — Ambulatory Visit: Payer: Medicare Other | Admitting: Urology

## 2017-10-22 VITALS — BP 173/83 | HR 56 | Ht 70.0 in | Wt 206.4 lb

## 2017-10-22 DIAGNOSIS — R350 Frequency of micturition: Secondary | ICD-10-CM

## 2017-10-22 DIAGNOSIS — N138 Other obstructive and reflux uropathy: Secondary | ICD-10-CM

## 2017-10-22 DIAGNOSIS — N401 Enlarged prostate with lower urinary tract symptoms: Secondary | ICD-10-CM | POA: Diagnosis not present

## 2017-10-22 LAB — BLADDER SCAN AMB NON-IMAGING

## 2017-10-22 NOTE — Progress Notes (Signed)
10/22/2017 1:22 PM   David Atkins 12/25/1928 [id number]  Referring provider: [name], MD [place], [place] [address]  Chief Complaint  Patient presents with  . Urinary Frequency    HPI:   H/o BPH with LUTS for many years on tamsulosin and tolterodine combo. PVR was 89 in 2017. PVR was 155 mL Feb 2018. DRE was normal. Good flow, Noc x 2-3 towards the AM. No frequency or urgency. He underwent CT Sep 2017 for flank pain which was normal. Prostate measured ~ 60 grams. Finasteride was added.  Today, pt is seen for the above. He continues tamsulosin, tolterodine and finasteride. PVR 38 ml. He feels like his LUTS have improved. Dietary changes improve his LUTS (avoid spicy or tomato).    PMH: Past Medical History:  Diagnosis Date  . 3-vessel coronary artery disease 2006   [place]  . basal cell   . BPH (benign prostatic hyperplasia)   . GERD (gastroesophageal reflux disease)   . Hyperlipidemia   . Hypertension   . Lumbar stenosis 12/03/13   MRI - lumbar pain with radiation down to LE  . Myocardial infarction ([place]) 2006  . OAB (overactive bladder)   . Osteoporosis 2010   by DEXA at [facility]  . Sleep apnea   . Spondylolisthesis of lumbar region 11/23/13   MRI Lumbar spine    Surgical History: Past Surgical History:  Procedure Laterality Date  . APPENDECTOMY  1951  . COLONOSCOPY    . COLONOSCOPY WITH PROPOFOL N/A 11/15/2015   Performed by Hulen Luster, MD at [place]  . CORONARY ARTERY BYPASS GRAFT  2006   3 vessel, s/p AMI  . NASAL SEPTOPLASTY W/ TURBINOPLASTY  1981  . TONSILLECTOMY      Home Medications:  Allergies as of 10/22/2017      Reactions   Sulfacetamide Sodium    Sulfa Antibiotics Rash      Medication List        Accurate as of 10/22/17  1:22 PM. Always use your most recent med list.          acetaminophen 325 MG tablet Commonly known as:  TYLENOL Take 650 mg by mouth 2 (two) times daily as needed.   aspirin  81 MG chewable tablet Chew 81 mg by mouth daily.   Blood Pressure Monitor Automat Devi Use daily to check blood pressure   CALCIUM + D PO Take 1 tablet by mouth daily.   diclofenac sodium 1 % Gel Commonly known as:  VOLTAREN Apply 4 g topically 4 (four) times daily.   finasteride 5 MG tablet Commonly known as:  PROSCAR Take 1 tablet (5 mg total) by mouth daily.   fluocinonide cream 0.05 % Commonly known as:  LIDEX Apply 1 application topically 2 (two) times daily.   gabapentin 300 MG capsule Commonly known as:  NEURONTIN TAKE 1 CAPSULE BY MOUTH AT  BEDTIME   gabapentin 300 MG capsule Commonly known as:  NEURONTIN TAKE 1 CAPSULE (300 MG TOTAL) BY MOUTH AT BEDTIME.   lisinopril 20 MG tablet Commonly known as:  PRINIVIL,ZESTRIL TAKE 1 TABLET (20 MG TOTAL) BY MOUTH DAILY.   meclizine 12.5 MG tablet Commonly known as:  ANTIVERT TAKE 1 TABLET (12.5 MG TOTAL) BY MOUTH 3 (THREE) TIMES DAILY AS NEEDED.   meloxicam 7.5 MG tablet Commonly known as:  MOBIC Take 1 tablet (7.5 mg total) daily by mouth.   metoprolol tartrate 25 MG tablet Commonly known as:  LOPRESSOR TAKE  1/2 TABLET (25 MG TOTAL) BY MOUTH 2 (TWO) TIMES DAILY.   omeprazole 20 MG capsule Commonly known as:  PRILOSEC TAKE 1 CAPSULE (20 MG TOTAL) BY MOUTH DAILY.   polyethylene glycol powder powder Commonly known as:  GLYCOLAX/MIRALAX Take 17 g by mouth once. Dissolve 1 capful of power into any liquid and drink once daily   simvastatin 20 MG tablet Commonly known as:  ZOCOR TAKE 1 TABLET BY MOUTH AT  BEDTIME   tamsulosin 0.4 MG Caps capsule Commonly known as:  FLOMAX TAKE 1 CAPSULE BY MOUTH DAILY AFTER BREAKFAST.   tolterodine 4 MG 24 hr capsule Commonly known as:  DETROL LA Take 4 mg by mouth daily.   Vitamin D-3 1000 units Caps Take 1 capsule by mouth daily.       Allergies:  Allergies  Allergen Reactions  . Sulfacetamide Sodium   . Sulfa Antibiotics Rash    Family History: Family History    Problem Relation Age of Onset  . Hyperlipidemia Mother   . Hypertension Mother   . Hypertension Father     Social History:  reports that he quit smoking about 54 years ago. His smoking use included cigarettes. he has never used smokeless tobacco. He reports that he does not drink alcohol or use drugs.  ROS: UROLOGY Frequent Urination?: Yes Hard to postpone urination?: Yes Burning/pain with urination?: No Get up at night to urinate?: Yes Leakage of urine?: No Urine stream starts and stops?: Yes Trouble starting stream?: Yes Do you have to strain to urinate?: Yes Blood in urine?: No Urinary tract infection?: No Sexually transmitted disease?: No Injury to kidneys or bladder?: No Painful intercourse?: No Weak stream?: No Erection problems?: No Penile pain?: No  Gastrointestinal Nausea?: No Vomiting?: No Indigestion/heartburn?: No Diarrhea?: No Constipation?: No  Constitutional Fever: No Night sweats?: No Weight loss?: No Fatigue?: No  Skin Skin rash/lesions?: No Itching?: No  Eyes Blurred vision?: No Double vision?: No  Ears/Nose/Throat Sore throat?: No Sinus problems?: No  Hematologic/Lymphatic Swollen glands?: No Easy bruising?: No  Cardiovascular Leg swelling?: No Chest pain?: No  Respiratory Cough?: No Shortness of breath?: No  Endocrine Excessive thirst?: No  Musculoskeletal Back pain?: No Joint pain?: No  Neurological Headaches?: No Dizziness?: No  Psychologic Depression?: No Anxiety?: No  Physical Exam: There were no vitals taken for this visit.  Constitutional:  Alert and oriented, No acute distress. HEENT: [place] AT, moist mucus membranes.  Trachea midline, no masses. Cardiovascular: No clubbing, cyanosis, or edema. Respiratory: Normal respiratory effort, no increased work of breathing. GI: Abdomen is soft, nontender, nondistended, no abdominal masses GU: No CVA tenderness. DRE: 30 g, smooth, no hard area or nodule  Skin: No  rashes, bruises or suspicious lesions. Lymph: No inguinal adenopathy. Neurologic: Grossly intact, no focal deficits, moving all 4 extremities. Psychiatric: Normal mood and affect.  Laboratory Data: Lab Results  Component Value Date   WBC 7.8 10/17/2016   HGB 15.5 10/17/2016   HCT 46.3 10/17/2016   MCV 95.7 10/17/2016   PLT 208.0 10/17/2016    Lab Results  Component Value Date   CREATININE 0.98 10/17/2016    No results found for: PSA1  No results found for: TESTOSTERONE  Lab Results  Component Value Date   HGBA1C 5.8 10/17/2016    Urinalysis Lab Results  Component Value Date   SPECGRAV 1.020 03/30/2017   PHUR 5.5 03/30/2017   COLORU Yellow 03/30/2017   APPEARANCEUR Clear 03/30/2017   LEUKOCYTESUR Negative 03/30/2017   PROTEINUR Negative  03/30/2017   GLUCOSEU Negative 03/30/2017   KETONESU Negative 03/30/2017   RBCU Negative 03/30/2017   BILIRUBINUR Negative 03/30/2017   UUROB 0.2 03/30/2017   NITRITE Negative 03/30/2017    Lab Results  Component Value Date   LABMICR See below: 01/09/2017   WBCUA 0-5 01/09/2017   RBCUA 0-2 01/09/2017   LABEPIT None seen 01/09/2017   MUCUS Present (A) 01/09/2017   BACTERIA None seen 01/09/2017    No results found for this or any previous visit. No results found for this or any previous visit. No results found for this or any previous visit. No results found for this or any previous visit. No results found for this or any previous visit. No results found for this or any previous visit. No results found for this or any previous visit. Results for orders placed in visit on 09/01/16  CT RENAL STONE STUDY   Narrative CLINICAL DATA:  Right flank pain for 3 days.  EXAM: CT ABDOMEN AND PELVIS WITHOUT CONTRAST  TECHNIQUE: Multidetector CT imaging of the abdomen and pelvis was performed following the standard protocol without IV contrast.  COMPARISON:  CT scan of October 31, 2015.  FINDINGS: Lower chest: Visualized  lung bases are unremarkable.  Hepatobiliary: Several small gallstones are noted in neck of gallbladder. Stable hepatic cysts are noted.  Pancreas: Normal.  Spleen: Normal.  Adrenals/Urinary Tract: Adrenal glands are unremarkable. No hydronephrosis or renal obstruction is noted. Bilateral renal cortical scarring is noted. No renal or ureteral calculi are noted. Urinary bladder appears normal.  Stomach/Bowel: There is no evidence of bowel obstruction.  Vascular/Lymphatic: Atherosclerosis of abdominal aorta is noted without aneurysm formation. No significant adenopathy is noted.  Reproductive: Mild prostatic enlargement is noted and stable.  Other: No abnormal fluid collection is noted.  Musculoskeletal: Old T12 compression fracture is noted. Degenerative changes seen involving the posterior facet joints of L4-5 and L5-S1.  IMPRESSION: Mild cholelithiasis.  Aortic atherosclerosis.  Stable mild prostatic enlargement.  No hydronephrosis or renal obstruction is noted. No renal or ureteral calculi is noted.   Electronically Signed   By: Marijo Conception, M.D.   On: 09/01/2016 16:04     Assessment & Plan:    1. Urinary frequency, BPH - doing well on triple therapy - tamsulosin, finasteride and tolterodine. Normal DRE. See in 1 year or sooner if issues.   - Bladder Scan (Post Void Residual) in office   No Follow-up on file.  Festus Aloe, MD  [facility] [address], [place], [place] [address] [phone]

## 2017-11-02 ENCOUNTER — Ambulatory Visit: Payer: Medicare Other | Admitting: Internal Medicine

## 2017-11-02 ENCOUNTER — Encounter: Payer: Self-pay | Admitting: Internal Medicine

## 2017-11-02 VITALS — BP 150/80 | HR 96 | Temp 97.8°F | Resp 16 | Ht 70.0 in | Wt 207.6 lb

## 2017-11-02 DIAGNOSIS — G2581 Restless legs syndrome: Secondary | ICD-10-CM

## 2017-11-02 DIAGNOSIS — R7302 Impaired glucose tolerance (oral): Secondary | ICD-10-CM

## 2017-11-02 DIAGNOSIS — M25471 Effusion, right ankle: Secondary | ICD-10-CM | POA: Diagnosis not present

## 2017-11-02 DIAGNOSIS — E785 Hyperlipidemia, unspecified: Secondary | ICD-10-CM | POA: Diagnosis not present

## 2017-11-02 DIAGNOSIS — M79671 Pain in right foot: Secondary | ICD-10-CM | POA: Diagnosis not present

## 2017-11-02 DIAGNOSIS — I10 Essential (primary) hypertension: Secondary | ICD-10-CM

## 2017-11-02 DIAGNOSIS — I6523 Occlusion and stenosis of bilateral carotid arteries: Secondary | ICD-10-CM

## 2017-11-02 DIAGNOSIS — R42 Dizziness and giddiness: Secondary | ICD-10-CM | POA: Diagnosis not present

## 2017-11-02 MED ORDER — PREDNISONE 10 MG PO TABS: ORAL_TABLET | ORAL | 0 refills | Status: DC

## 2017-11-02 MED ORDER — AMLODIPINE BESYLATE 2.5 MG PO TABS: 2.5000 mg | ORAL_TABLET | Freq: Every day | ORAL | 3 refills | Status: DC

## 2017-11-02 NOTE — Progress Notes (Signed)
Subjective:  Patient ID: David Atkins, male    DOB: 10-25-1929  Age: 83 y.o. MRN: [id number]  CC: The primary encounter diagnosis was Hyperlipidemia LDL goal <70. Diagnoses of Essential hypertension, Impaired glucose tolerance, Right ankle swelling, Restless leg, Bilateral carotid artery stenosis, Vertigo, Right foot pain, and RLS (restless legs syndrome) were also pertinent to this visit.  HPI Luc Shammas presents for multiple issues.  Last seen by me over one year ago.   1) elevated blood pressure readings for the past 2 weeks. :  Last night 189/109 after taking meclizine several hours earlier for treatment of recurrent vertigo.  ,  This morning's readings are still elevated at 172/104  2) vertigo;  Occurring daily for the past 2-3 months.  Not accompanied by  headaches.  Occurs with position change and  with turning head.  Usually occurs in the morning and is Gone by noon , sometimes without use of of meclizine   3) right ankle pain :  Occurred with no history of trauma or gout. Previous evaluation by margaret with x ray with and [place] to rule out DVT. Then sent to [facility] given the presence of  Vascular disease noted on plain films last month .  ABIS noted non compressible arteries likely due to medial calcification, but waveforms were normal and symptoms of claudication were not present.   Has venous insufficiency managed with Grade I stockings when standing at his workbench for 5-6 hours per day    4) restless legs feeling at nigh, but often involves the entire left side of body at times.   Lab Results  Component Value Date   FERRITIN 90 11/02/2017   . Lab Results  Component Value Date   HGBA1C 5.6 11/02/2017    Outpatient Medications Prior to Visit  Medication Sig Dispense Refill  . acetaminophen (TYLENOL) 325 MG tablet Take 650 mg by mouth 2 (two) times daily as needed.    Marland Kitchen aspirin 81 MG chewable tablet Chew 81 mg by mouth daily.    . Blood Pressure Monitoring (BLOOD PRESSURE  MONITOR AUTOMAT) DEVI Use daily to check blood pressure 1 Device 0  . Calcium Carbonate-Vitamin D (CALCIUM + D PO) Take 1 tablet by mouth daily.     . Cholecalciferol (VITAMIN D-3) 1000 UNITS CAPS Take 1 capsule by mouth daily.    . diclofenac sodium (VOLTAREN) 1 % GEL Apply 4 g topically 4 (four) times daily. 1 Tube 3  . finasteride (PROSCAR) 5 MG tablet Take 1 tablet (5 mg total) by mouth daily. 90 tablet 3  . fluocinonide cream (LIDEX) 9.62 % Apply 1 application topically 2 (two) times daily.    Marland Kitchen gabapentin (NEURONTIN) 300 MG capsule TAKE 1 CAPSULE BY MOUTH AT  BEDTIME 30 capsule 1  . gabapentin (NEURONTIN) 300 MG capsule TAKE 1 CAPSULE (300 MG TOTAL) BY MOUTH AT BEDTIME. 30 capsule 0  . lisinopril (PRINIVIL,ZESTRIL) 20 MG tablet TAKE 1 TABLET (20 MG TOTAL) BY MOUTH DAILY. 90 tablet 1  . meclizine (ANTIVERT) 12.5 MG tablet TAKE 1 TABLET (12.5 MG TOTAL) BY MOUTH 3 (THREE) TIMES DAILY AS NEEDED. 30 tablet 1  . meloxicam (MOBIC) 7.5 MG tablet Take 1 tablet (7.5 mg total) daily by mouth. 30 tablet 1  . metoprolol tartrate (LOPRESSOR) 25 MG tablet TAKE 1/2 TABLET (25 MG TOTAL) BY MOUTH 2 (TWO) TIMES DAILY. 180 tablet 1  . omeprazole (PRILOSEC) 20 MG capsule TAKE 1 CAPSULE (20 MG TOTAL) BY MOUTH DAILY. 90 capsule 1  .  polyethylene glycol powder (GLYCOLAX/MIRALAX) powder Take 17 g by mouth once. Dissolve 1 capful of power into any liquid and drink once daily (Patient taking differently: Take 17 g by mouth 2 (two) times daily. Dissolve 1 capful of power into any liquid and drink once daily) 500 g 0  . simvastatin (ZOCOR) 20 MG tablet TAKE 1 TABLET BY MOUTH AT  BEDTIME 90 tablet 1  . tamsulosin (FLOMAX) 0.4 MG CAPS capsule TAKE 1 CAPSULE BY MOUTH DAILY AFTER BREAKFAST. 90 capsule 2  . tolterodine (DETROL LA) 4 MG 24 hr capsule Take 4 mg by mouth daily.     No facility-administered medications prior to visit.     Review of Systems;  Patient denies headache, fevers, malaise, unintentional weight  loss, skin rash, eye pain, sinus congestion and sinus pain, sore throat, dysphagia,  hemoptysis , cough, dyspnea, wheezing, chest pain, palpitations, orthopnea, edema, abdominal pain, nausea, melena, diarrhea, constipation, flank pain, dysuria, hematuria, urinary  Frequency, nocturia, numbness, tingling, seizures,  Focal weakness, Loss of consciousness,  Tremor, insomnia, depression, anxiety, and suicidal ideation.      Objective:  BP (!) 150/80 (BP Location: Left Arm, Patient Position: Sitting, Cuff Size: Normal)   Pulse 96   Temp 97.8 F (36.6 C) (Oral)   Resp 16   Ht 5\' 10"  (1.778 m)   Wt 207 lb 9.6 oz (94.2 kg)   SpO2 96%   BMI 29.79 kg/m   BP Readings from Last 3 Encounters:  11/02/17 (!) 150/80  10/22/17 (!) 173/83  10/11/17 (!) 152/66    Wt Readings from Last 3 Encounters:  11/02/17 207 lb 9.6 oz (94.2 kg)  10/22/17 206 lb 6.4 oz (93.6 kg)  10/11/17 205 lb (93 kg)    General appearance: alert, cooperative and appears stated age Ears: normal TM's and external ear canals both ears TNeck: no adenopathy, no carotid bruit, supple, symmetrical, trachea midline and thyroid not enlarged, symmetric, no tenderness/mass/nodules Back: symmetric, no curvature. ROM normal. No CVA tenderness. Lungs: clear to auscultation bilaterally Heart: regular rate and rhythm, S1, S2 normal, no murmur, click, rub or gallop Abdomen: soft, non-tender; bowel sounds normal; no masses,  no organomegaly Pulses: 2+ and symmetric Skin: Skin color, texture, turgor normal. No rashes or lesions Lymph nodes: Cervical, supraclavicular, and axillary nodes normal. Ext: right ankle with full ROM,  No redness or swelling  Neuro:  awake and interactive with normal mood and affect. Higher cortical functions are normal. Speech is clear without word-finding difficulty or dysarthria. Extraocular movements are intact. Visual fields of both eyes are grossly intact. Sensation to light touch is grossly intact bilaterally of  upper and lower extremities. Motor examination shows 4+/5 symmetric hand grip and upper extremity and 5/5 lower extremity strength. There is no pronation or drift. Gait is non-ataxic    Lab Results  Component Value Date   HGBA1C 5.6 11/02/2017   HGBA1C 5.8 10/17/2016   HGBA1C 5.8 04/17/2013    Lab Results  Component Value Date   CREATININE 0.98 10/17/2016   CREATININE 1.01 10/31/2015   CREATININE 1.0 11/25/2014    Lab Results  Component Value Date   WBC 7.8 10/17/2016   HGB 15.5 10/17/2016   HCT 46.3 10/17/2016   PLT 208.0 10/17/2016   GLUCOSE 101 (H) 10/17/2016   CHOL 124 12/10/2015   TRIG 75.0 12/10/2015   HDL 43.60 12/10/2015   LDLDIRECT 67.0 10/17/2016   LDLCALC 65 12/10/2015   ALT 29 10/17/2016   AST 27 10/17/2016   NA  142 10/17/2016   K 4.3 10/17/2016   CL 105 10/17/2016   CREATININE 0.98 10/17/2016   BUN 16 10/17/2016   CO2 30 10/17/2016   TSH 2.00 11/02/2017   HGBA1C 5.6 11/02/2017    US Venous Img Lower Unilateral Right  Result Date: 07/18/2017 CLINICAL DATA:  Right lower extremity swelling. EXAM: RIGHT LOWER EXTREMITY VENOUS DOPPLER ULTRASOUND TECHNIQUE: Gray-scale sonography with graded compression, as well as color Doppler and duplex ultrasound were performed to evaluate the lower extremity deep venous systems from the level of the common femoral vein and including the common femoral, femoral, profunda femoral, popliteal and calf veins including the posterior tibial, peroneal and gastrocnemius veins when visible. The superficial great saphenous vein was also interrogated. Spectral Doppler was utilized to evaluate flow at rest and with distal augmentation maneuvers in the common femoral, femoral and popliteal veins. COMPARISON:  No recent prior. FINDINGS: Contralateral Common Femoral Vein: Respiratory phasicity is normal and symmetric with the symptomatic side. No evidence of thrombus. Normal compressibility. Common Femoral Vein: No evidence of thrombus. Normal  compressibility, respiratory phasicity and response to augmentation. Saphenofemoral Junction: No evidence of thrombus. Normal compressibility and flow on color Doppler imaging. Profunda Femoral Vein: No evidence of thrombus. Normal compressibility and flow on color Doppler imaging. Femoral Vein: No evidence of thrombus. Normal compressibility, respiratory phasicity and response to augmentation. Popliteal Vein: No evidence of thrombus. Normal compressibility, respiratory phasicity and response to augmentation. Calf Veins: No evidence of thrombus. Normal compressibility and flow on color Doppler imaging. Limited visualization of the peroneal veins . Superficial Great Saphenous Vein: No evidence of thrombus. Normal compressibility and flow on color Doppler imaging. Venous Reflux:  None. Other Findings:  None. IMPRESSION: No evidence of DVT within the right lower extremity. Electronically Signed   By: Marcello Moores  Register   On: 07/18/2017 11:24    Assessment & Plan:   Problem List Items Addressed This Visit    Bilateral carotid artery stenosis    < 39% stenosis by July 2017 U/s. Continue asa, simvastatin and prn surveillance. LDL and CMET drawn today as he has not had liver function tests in over a year.    Lab Results  Component Value Date   ALT 29 10/17/2016   AST 27 10/17/2016   ALKPHOS 67 10/17/2016   BILITOT 0.5 10/17/2016     Lab Results  Component Value Date   CHOL 124 12/10/2015   HDL 43.60 12/10/2015   LDLCALC 65 12/10/2015   LDLDIRECT 67.0 10/17/2016   TRIG 75.0 12/10/2015   CHOLHDL 3 12/10/2015         Relevant Medications   amLODipine (NORVASC) 2.5 MG tablet   Hyperlipidemia LDL goal <70 - Primary   Relevant Medications   amLODipine (NORVASC) 2.5 MG tablet   Other Relevant Orders   TSH (Completed)   LDL cholesterol, direct   Hypertension    Advised to continue 12.5 mg metoprolol bid ,  20 mg lisinopril daily and add 2.5 mg amlodipine prn SBP > 150       Relevant  Medications   amLODipine (NORVASC) 2.5 MG tablet   Impaired glucose tolerance   Relevant Orders   Comprehensive metabolic panel   Hemoglobin A1c (Completed)   Right foot pain    Lateral ankle. Vascular evaluation non revealing,  No fractures by plain films  Likely a ligament strain given soft tissue swelling noted on plain x rays .  Needs to avoid NSAIDS.  Prednisone taper and tylenol  RLS (restless legs syndrome)    He was prescribed gabapentin by Dr Lacinda Axon.  Iron studies have not been done and were ordered today. Given his histor of Venous insufficiency,  Will avoid  gabapentin and treat with Requip if iron deficiency is ruled out.       Vertigo    Reassured by normal neurologic exam. Patient states dizziness feels similar to episodes in past. Declines MRI Brain. Not particularly orthostatic. Refilled meclizine as requested and added prednisone taper .Return precautions given.       Other Visit Diagnoses    Right ankle swelling       Relevant Orders   Sedimentation rate (Completed)   Restless leg       Relevant Orders   Iron, TIBC and Ferritin Panel      A total of 40 minutes was spent with patient more than half of which was spent in counseling patient on the above mentioned issues , reviewing and explaining recent labs and imaging studies done, and coordination of care.   I am having [name] start on predniSONE and amLODipine. I am also having him maintain his tolterodine, acetaminophen, Calcium Carbonate-Vitamin D (CALCIUM + D PO), Blood Pressure Monitor Automat, Vitamin D-3, polyethylene glycol powder, tamsulosin, aspirin, metoprolol tartrate, finasteride, omeprazole, simvastatin, fluocinonide cream, gabapentin, lisinopril, diclofenac sodium, gabapentin, meloxicam, and meclizine.  Meds ordered this encounter  Medications  . predniSONE (DELTASONE) 10 MG tablet    Sig: 6 tablets on Day 1 , then reduce by 1 tablet daily until gone    Dispense:  21 tablet    Refill:   0  . amLODipine (NORVASC) 2.5 MG tablet    Sig: Take 1 tablet (2.5 mg total) by mouth daily.    Dispense:  90 tablet    Refill:  3    There are no discontinued medications.  Follow-up: Return in about 3 months (around 01/31/2018).   [name], MD

## 2017-11-02 NOTE — Patient Instructions (Addendum)
For your ankle pain :  You should avoid ibuprofen and meloxicam.   But you can use 2000 mg of tylenol  every day (500 mg every 6 hours ) safely with NO DAMAGE TO   Your blood pressure or your kidneys    For your vertigo:  I have prescribed Prednisone taper:  Take 6 tablets all at once on Day 1,  Then taper by 1 tablet daily until gone (50 mg on Day 2,  40 mg on Day 3,  Etc.) .  Saunders Glance can continue to use the meclizine as well.  If no improvement,  Call Dr Pryor Ochoa for treatment on "the table"    For your blood pressure:     Continue 1/2 tablet twice daily of the metoprolol Continue  Full tablet of lisinopril once daily  Add 2.5mg  amlodipine once daily if BP is > 150 at lunch time   Your restless legs might be due to iron deficiency,  We are checking today.  If not,  I will prescribe Requip or Mirapex (not gabapentin )

## 2017-11-04 ENCOUNTER — Encounter: Payer: Self-pay | Admitting: Internal Medicine

## 2017-11-04 DIAGNOSIS — I2510 Atherosclerotic heart disease of native coronary artery without angina pectoris: Secondary | ICD-10-CM | POA: Insufficient documentation

## 2017-11-04 NOTE — Assessment & Plan Note (Addendum)
Advised to continue 12.5 mg metoprolol bid ,  20 mg lisinopril daily and add 2.5 mg amlodipine prn SBP > 150

## 2017-11-04 NOTE — Assessment & Plan Note (Signed)
Reassured by normal neurologic exam. Patient states dizziness feels similar to episodes in past. Declines MRI Brain. Not particularly orthostatic. Refilled meclizine as requested and added prednisone taper .Return precautions given.

## 2017-11-04 NOTE — Assessment & Plan Note (Addendum)
<   39% stenosis by July 2017 U/s. Continue asa, simvastatin and prn surveillance. LDL and CMET drawn today as he has not had liver function tests in over a year.    Lab Results  Component Value Date   ALT 29 10/17/2016   AST 27 10/17/2016   ALKPHOS 67 10/17/2016   BILITOT 0.5 10/17/2016     Lab Results  Component Value Date   CHOL 124 12/10/2015   HDL 43.60 12/10/2015   LDLCALC 65 12/10/2015   LDLDIRECT 67.0 10/17/2016   TRIG 75.0 12/10/2015   CHOLHDL 3 12/10/2015

## 2017-11-04 NOTE — Assessment & Plan Note (Addendum)
He was prescribed gabapentin by Dr Lacinda Axon.  Iron studies have not been done and were ordered today. Given his histor of Venous insufficiency,  Will avoid  gabapentin and treat with Requip if iron deficiency is ruled out.

## 2017-11-04 NOTE — Progress Notes (Signed)
Home Cardiology Office Note  Date:  11/05/2017   ID:  David Atkins, DOB August 01, 1929, MRN [id number]  PCP:  [name], MD   Chief Complaint  Patient presents with  . other    6 month follow up. Meds reviewed by the pt. verbally. "doing well." Dr. Derrel Nip has been treating him for dizziness.    HPI:  David Atkins is a very pleasant 83 year old gentleman with a history of  coronary artery disease, bypass surgery in 2006 at [facility] in [place],  mild carotid arterial disease by report,  hyperlipidemia,  small smoking history in the remote past,  aortic athero on CT 08/2018 who presents for routine followup of his coronary artery disease .   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 He went to the emergency room for further evaluation. Workup there was negative including normal basic metabolic panel, negative cardiac enzymes. CT scan showed chronic microvascular changes in the deep white matter, and he was discharged home after medical management of his blood pressure. On a previous visit, his Son was concerned about various arguments he may be having with other family members at home and the stress this may be causing him.    PMH:   has a past medical history of 3-vessel coronary artery disease (2006),  basal cell, BPH (benign prostatic hyperplasia), GERD (gastroesophageal reflux disease), Hyperlipidemia, Hypertension, Lumbar stenosis (12/03/13), Myocardial infarction ([place]) (2006), OAB (overactive bladder), Osteoporosis (2010), Sleep apnea, and Spondylolisthesis of lumbar region (11/23/13).  PSH:    Past Surgical History:  Procedure Laterality Date  . APPENDECTOMY  1951  . COLONOSCOPY    . COLONOSCOPY WITH PROPOFOL N/A 11/15/2015   Procedure: COLONOSCOPY WITH PROPOFOL;  Surgeon: Hulen Luster, MD;  Location: [facility] ENDOSCOPY;  Service: Gastroenterology;  Laterality: N/A;  . CORONARY ARTERY BYPASS GRAFT  2006   3 vessel, s/p AMI  . NASAL SEPTOPLASTY W/ TURBINOPLASTY  1981  . TONSILLECTOMY      Current Outpatient Medications  Medication Sig Dispense Refill  . acetaminophen (TYLENOL) 325 MG tablet Take 650 mg by mouth 2 (two) times daily as needed.    Marland Kitchen amLODipine (NORVASC) 2.5 MG tablet Take 1 tablet (2.5 mg total) by mouth daily. 90 tablet 3  . aspirin 81 MG chewable tablet Chew 81 mg by mouth daily.    . Blood Pressure Monitoring (BLOOD PRESSURE MONITOR AUTOMAT) DEVI Use daily to check blood pressure 1 Device 0  . Calcium Carbonate-Vitamin D (CALCIUM + D PO) Take 1 tablet by mouth daily.     . Cholecalciferol (VITAMIN D-3) 1000 UNITS CAPS Take 1 capsule by mouth daily.    . diclofenac sodium (VOLTAREN) 1 % GEL Apply 4 g topically 4 (four) times daily. 1 Tube 3  . finasteride (PROSCAR) 5 MG tablet Take 1 tablet (5 mg total) by mouth daily. 90 tablet  3  . fluocinonide cream (LIDEX) 3.53 % Apply 1 application topically 2 (two) times daily.    Marland Kitchen lisinopril (PRINIVIL,ZESTRIL) 20 MG tablet TAKE 1 TABLET (20 MG TOTAL) BY MOUTH DAILY. 90 tablet 1  . meclizine (ANTIVERT) 12.5 MG tablet TAKE 1 TABLET (12.5 MG TOTAL) BY MOUTH 3 (THREE) TIMES DAILY AS NEEDED. 30 tablet 1  . metoprolol tartrate (LOPRESSOR) 25 MG tablet TAKE 1/2 TABLET (25 MG TOTAL) BY MOUTH 2 (TWO) TIMES DAILY. 180 tablet 1  . omeprazole  (PRILOSEC) 20 MG capsule TAKE 1 CAPSULE (20 MG TOTAL) BY MOUTH DAILY. 90 capsule 1  . polyethylene glycol powder (GLYCOLAX/MIRALAX) powder Take 17 g by mouth once. Dissolve 1 capful of power into any liquid and drink once daily (Patient taking differently: Take 17 g by mouth 2 (two) times daily. Dissolve 1 capful of power into any liquid and drink once daily) 500 g 0  . predniSONE (DELTASONE) 10 MG tablet 6 tablets on Day 1 , then reduce by 1 tablet daily until gone 21 tablet 0  . simvastatin (ZOCOR) 20 MG tablet TAKE 1 TABLET BY MOUTH AT  BEDTIME 90 tablet 1  . tamsulosin (FLOMAX) 0.4 MG CAPS capsule TAKE 1 CAPSULE BY MOUTH DAILY AFTER BREAKFAST. 90 capsule 2  . tolterodine (DETROL LA) 4 MG 24 hr capsule Take 4 mg by mouth daily.     No current facility-administered medications for this visit.      Allergies:   Sulfacetamide sodium and Sulfa antibiotics   Social History:  The patient  reports that he quit smoking about 54 years ago. His smoking use included cigarettes. he has never used smokeless tobacco. He reports that he does not drink alcohol or use drugs.   Family History:   family history includes Hyperlipidemia in his mother; Hypertension in his father and mother.    Review of Systems: Review of Systems  Constitutional: Negative.   Respiratory: Negative.   Cardiovascular: Negative.   Gastrointestinal: Negative.   Musculoskeletal: Positive for back pain.  Neurological: Negative.   Psychiatric/Behavioral: Negative.   All other systems reviewed and are negative.    PHYSICAL EXAM: VS:  BP 140/70 (BP Location: Left Arm, Patient Position: Sitting, Cuff Size: Normal)   Pulse 70   Ht 5\' 10"  (1.778 m)   Wt 207 lb 4 oz (94 kg)   BMI 29.74 kg/m   , BMI Body mass index is 29.74 kg/m. GEN: Well nourished, well developed, in no acute distress  HEENT: normal  Neck: no JVD, carotid bruits, or masses Cardiac: RRR; no murmurs, rubs, or gallops,no edema  Respiratory:  clear to  auscultation bilaterally, normal work of breathing GI: soft, nontender, nondistended, + BS MS: no deformity or atrophy  Skin: warm and dry, no rash Neuro:  Strength and sensation are intact Psych: euthymic mood, full affect    Recent Labs: 11/02/2017: TSH 2.00    Lipid Panel Lab Results  Component Value Date   CHOL 124 12/10/2015   HDL 43.60 12/10/2015   LDLCALC 65 12/10/2015   TRIG 75.0 12/10/2015      Wt Readings from Last 3 Encounters:  11/05/17 207 lb 4 oz (94 kg)  11/02/17 207 lb 9.6 oz (94.2 kg)  10/22/17 206 lb 6.4 oz (93.6 kg)       ASSESSMENT AND PLAN:  3-vessel coronary artery disease - Plan: EKG 12-Lead No changes to his medication regiment, currently asymptomatic No further testing at this time .stable.   Essential hypertension - Plan: EKG  12-Lead Blood pressure is well controlled on today's visit. No changes made to the medications. Long discussion concerning the amlodipine.  He does have chronic lower extremity swelling He is taking amlodipine as needed for systolic pressure greater than 150 If swelling gets worse recommended he call our office Will try to avoid diuretics as he has bladder control issues, spasm  Hyperlipidemia LDL goal <70 No recent lipid panel available, previously at goal  Bilateral carotid artery stenosis History of mild bilateral carotid disease Cholesterol at goal stable.   Hx of CABG Stable,  no new testing   Total encounter time more than 25 minutes  Greater than 50% was spent in counseling and coordination of care with the patient   Disposition:   F/U  12 months   Orders Placed This Encounter  Procedures  . EKG 12-Lead     Signed, Esmond Plants, M.D., Ph.D. 11/05/2017  [place], [place]

## 2017-11-04 NOTE — Assessment & Plan Note (Addendum)
Lateral ankle. Vascular evaluation non revealing,  No fractures by plain films  Likely a ligament strain given soft tissue swelling noted on plain x rays .  Needs to avoid NSAIDS.  Prednisone taper and tylenol

## 2017-11-05 ENCOUNTER — Encounter: Payer: Self-pay | Admitting: Cardiovascular Disease

## 2017-11-05 ENCOUNTER — Ambulatory Visit: Payer: Medicare Other | Admitting: Cardiovascular Disease

## 2017-11-05 VITALS — BP 140/70 | HR 70 | Ht 70.0 in | Wt 207.2 lb

## 2017-11-05 DIAGNOSIS — I25118 Atherosclerotic heart disease of native coronary artery with other forms of angina pectoris: Secondary | ICD-10-CM

## 2017-11-05 DIAGNOSIS — I6523 Occlusion and stenosis of bilateral carotid arteries: Secondary | ICD-10-CM | POA: Diagnosis not present

## 2017-11-05 DIAGNOSIS — G4733 Obstructive sleep apnea (adult) (pediatric): Secondary | ICD-10-CM

## 2017-11-05 DIAGNOSIS — I10 Essential (primary) hypertension: Secondary | ICD-10-CM

## 2017-11-05 DIAGNOSIS — Z9989 Dependence on other enabling machines and devices: Secondary | ICD-10-CM | POA: Diagnosis not present

## 2017-11-05 DIAGNOSIS — Z951 Presence of aortocoronary bypass graft: Secondary | ICD-10-CM

## 2017-11-05 DIAGNOSIS — E785 Hyperlipidemia, unspecified: Secondary | ICD-10-CM

## 2017-11-05 NOTE — Patient Instructions (Signed)

## 2017-11-06 ENCOUNTER — Encounter: Payer: Self-pay | Admitting: Internal Medicine

## 2017-11-06 ENCOUNTER — Ambulatory Visit (INDEPENDENT_AMBULATORY_CARE_PROVIDER_SITE_OTHER): Payer: Medicare Other

## 2017-11-06 ENCOUNTER — Other Ambulatory Visit: Payer: Self-pay | Admitting: Internal Medicine

## 2017-11-06 VITALS — BP 140/70 | HR 58 | Temp 98.0°F | Resp 15 | Ht 69.5 in | Wt 207.8 lb

## 2017-11-06 DIAGNOSIS — G2581 Restless legs syndrome: Secondary | ICD-10-CM

## 2017-11-06 DIAGNOSIS — Z1331 Encounter for screening for depression: Secondary | ICD-10-CM | POA: Diagnosis not present

## 2017-11-06 DIAGNOSIS — Z0000 Encounter for general adult medical examination without abnormal findings: Secondary | ICD-10-CM

## 2017-11-06 LAB — IRON,TIBC AND FERRITIN PANEL
%SAT: 33 % (calc) (ref 15–60)
Ferritin: 90 ng/mL (ref 20–380)
Iron: 100 ug/dL (ref 50–180)
TIBC: 307 mcg/dL (calc) (ref 250–425)

## 2017-11-06 MED ORDER — ROPINIROLE HCL 0.25 MG PO TABS: 0.2500 mg | ORAL_TABLET | Freq: Every day | ORAL | 0 refills | Status: DC

## 2017-11-06 NOTE — Assessment & Plan Note (Signed)
Trial of requip.

## 2017-11-06 NOTE — Progress Notes (Signed)
Subjective:   David Atkins is a 83 y.o. male who presents for Medicare Annual/Subsequent preventive examination.  Review of Systems:  No ROS.  Medicare Wellness Visit. Additional risk factors are reflected in the social history.  Cardiac Risk Factors include: advanced age (>72men, >59 women);male gender;hypertension     Objective:    Vitals: BP 140/70 (BP Location: Left Arm, Patient Position: Sitting, Cuff Size: Normal)   Pulse (!) 58   Temp 98 F (36.7 C)   Resp 15   Ht 5' 9.5" (1.765 m)   Wt 207 lb 12.8 oz (94.3 kg)   SpO2 96%   BMI 30.25 kg/m   Body mass index is 30.25 kg/m.  Advanced Directives 11/06/2017 08/31/2017 11/06/2016 12/16/2015 10/31/2015  Does Patient Have a Medical Advance Directive? Yes Yes Yes Yes Yes  Type of Advance Directive Living will;Healthcare Power of [place];Living will [place];Living will Living will [place];Living will  Does patient want to make changes to medical advance directive? No - Patient declined - - No - Patient declined No - Patient declined  Copy of [place] in Chart? No - copy requested - No - copy requested No - copy requested No - copy requested    Tobacco Social History   Tobacco Use  Smoking Status Former Smoker  . Types: Cigarettes  . Last attempt to quit: 07/10/1963  . Years since quitting: 54.3  Smokeless Tobacco Never Used     Counseling given: Not Answered   Clinical Intake:  Pre-visit preparation completed: Yes  Pain : No/denies pain     Nutritional Status: BMI > 30  Obese Diabetes: No  Activities of Daily Living: Independent Ambulation: Independent with device- listed below Home Assistive Devices/Equipment: Cane Medication Administration: Independent Home Management: Independent     Do you feel unsafe in your current relationship?: No Do you feel physically threatened by others?: No Anyone hurting you at home,  work, or school?: No Unable to ask?: No Information provided on Community resources: No  How often do you need to have someone help you when you read instructions, pamphlets, or other written materials from your doctor or pharmacy?: 1 - Never  Interpreter Needed?: No     Past Medical History:  Diagnosis Date  . 3-vessel coronary artery disease 2006   [place]  . basal cell   . BPH (benign prostatic hyperplasia)   . GERD (gastroesophageal reflux disease)   . Hyperlipidemia   . Hypertension   . Lumbar stenosis 12/03/13   MRI - lumbar pain with radiation down to LE  . Myocardial infarction ([place]) 2006  . OAB (overactive bladder)   . Osteoporosis 2010   by [facility]  . Sleep apnea   . Spondylolisthesis of lumbar region 11/23/13   MRI Lumbar spine   Past Surgical History:  Procedure Laterality Date  . APPENDECTOMY  1951  . COLONOSCOPY    . COLONOSCOPY WITH PROPOFOL N/A 11/15/2015   Procedure: COLONOSCOPY WITH PROPOFOL;  Surgeon: Hulen Luster, MD;  Location: [facility] ENDOSCOPY;  Service: Gastroenterology;  Laterality: N/A;  . CORONARY ARTERY BYPASS GRAFT  2006   3 vessel, s/p AMI  . NASAL SEPTOPLASTY W/ TURBINOPLASTY  1981  . TONSILLECTOMY     Family History  Problem Relation Age of Onset  . Hyperlipidemia Mother   . Hypertension Mother   . Hypertension Father    Social History   Socioeconomic History  . Marital status: Married  Spouse name: None  . Number of children: None  . Years of education: None  . Highest education level: None  Social Needs  . Financial resource strain: Not hard at all  . Food insecurity - worry: Never true  . Food insecurity - inability: Never true  . Transportation needs - medical: No  . Transportation needs - non-medical: No  Occupational History  . None  Tobacco Use  . Smoking status: Former Smoker    Types: Cigarettes    Last attempt to quit: 07/10/1963    Years since quitting: 54.3  . Smokeless tobacco: Never Used  Substance and  Sexual Activity  . Alcohol use: No  . Drug use: No  . Sexual activity: Not Currently  Other Topics Concern  . None  Social History Narrative  . None    Outpatient Encounter Medications as of 11/06/2017  Medication Sig  . acetaminophen (TYLENOL) 325 MG tablet Take 650 mg by mouth 2 (two) times daily as needed.  Marland Kitchen amLODipine (NORVASC) 2.5 MG tablet Take 1 tablet (2.5 mg total) by mouth daily.  Marland Kitchen aspirin 81 MG chewable tablet Chew 81 mg by mouth daily.  . Blood Pressure Monitoring (BLOOD PRESSURE MONITOR AUTOMAT) DEVI Use daily to check blood pressure  . Calcium Carbonate-Vitamin D (CALCIUM + D PO) Take 1 tablet by mouth daily.   . Cholecalciferol (VITAMIN D-3) 1000 UNITS CAPS Take 1 capsule by mouth daily.  . diclofenac sodium (VOLTAREN) 1 % GEL Apply 4 g topically 4 (four) times daily.  . finasteride (PROSCAR) 5 MG tablet Take 1 tablet (5 mg total) by mouth daily.  . fluocinonide cream (LIDEX) 0.07 % Apply 1 application topically 2 (two) times daily.  Marland Kitchen lisinopril (PRINIVIL,ZESTRIL) 20 MG tablet TAKE 1 TABLET (20 MG TOTAL) BY MOUTH DAILY.  . meclizine (ANTIVERT) 12.5 MG tablet TAKE 1 TABLET (12.5 MG TOTAL) BY MOUTH 3 (THREE) TIMES DAILY AS NEEDED.  . metoprolol tartrate (LOPRESSOR) 25 MG tablet TAKE 1/2 TABLET (25 MG TOTAL) BY MOUTH 2 (TWO) TIMES DAILY.  Marland Kitchen omeprazole (PRILOSEC) 20 MG capsule TAKE 1 CAPSULE (20 MG TOTAL) BY MOUTH DAILY.  Marland Kitchen polyethylene glycol powder (GLYCOLAX/MIRALAX) powder Take 17 g by mouth once. Dissolve 1 capful of power into any liquid and drink once daily (Patient taking differently: Take 17 g by mouth 2 (two) times daily. Dissolve 1 capful of power into any liquid and drink once daily)  . predniSONE (DELTASONE) 10 MG tablet 6 tablets on Day 1 , then reduce by 1 tablet daily until gone  . simvastatin (ZOCOR) 20 MG tablet TAKE 1 TABLET BY MOUTH AT  BEDTIME  . tamsulosin (FLOMAX) 0.4 MG CAPS capsule TAKE 1 CAPSULE BY MOUTH DAILY AFTER BREAKFAST.  Marland Kitchen tolterodine (DETROL  LA) 4 MG 24 hr capsule Take 4 mg by mouth daily.   No facility-administered encounter medications on file as of 11/06/2017.     Activities of Daily Living In your present state of health, do you have any difficulty performing the following activities: 11/06/2017 11/06/2016  Hearing? Tempie Donning  Comment HOH.  Hearing aid, bilateral Bilateral hearing aids  Vision? N N  Difficulty concentrating or making decisions? N N  Walking or climbing stairs? Y N  Comment Unsteady gait -  Dressing or bathing? N N  Doing errands, shopping? N N  Preparing Food and eating ? N N  Comment - Wife does the cooking  Using the Toilet? N N  In the past six months, have you accidently leaked  urine? N N  Comment Followed by Urologist and PCP -  Do you have problems with loss of bowel control? N N  Managing your Medications? N N  Managing your Finances? N N  Housekeeping or managing your Housekeeping? Y N  Comment Cleaning service assists x4 weeks Wife manages laundry  Some recent data might be hidden   Patient Care Team: [name], MD as PCP - General (Internal Medicine)   Assessment:    This is a routine wellness examination for [place]. The goal of the wellness visit is to assist the patient how to close the gaps in care and create a preventative care plan for the patient.   The roster of all physicians providing medical care to patient is listed in the Snapshot section of the chart.  Taking calcium VIT D as appropriate/Osteoporosis reviewed.    Safety issues reviewed; Smoke and carbon monoxide detectors in the home. No firearms in the home.  Wears seatbelt when driving or riding with others. Patient does wear sunscreen or protective clothing when in direct sunlight. No violence in the home.  Depression- PHQ 2 &9 complete.  No signs/symptoms or verbal communication regarding little pleasure in doing things, feeling down, depressed or hopeless. No changes in sleeping, energy, eating, concentrating.  No  thoughts of self harm or harm towards others.  Time spent on this topic is 10 minutes.   Patient is alert, normal appearance, oriented to person/place/and time. Correctly identified the president of the [place], recall of 1/3 words, and performing simple calculations. Displays appropriate judgement and can read correct time from watch face.   No new identified risk were noted.  No failures at ADL's or IADL's.  Ambulates with cane.  BMI- discussed the importance of a healthy diet, water intake and the benefits of aerobic exercise. Educational material provided.   24 hour diet recall: Low carb foods  Daily fluid intake: 0 cups of caffeine, 5 cups of water  Dental- dentures.  Eye- Visual acuity not assessed per patient preference since they have regular follow up with the ophthalmologist.  Wears corrective lenses.  Sleep patterns- Sleeps 8 hours at night.  Nocturia x2.  Wakes feeling rested. CPAP in use.  Health maintenance gaps- closed.  Patient Concerns: None at this time. Follow up with PCP as needed.  Exercise Activities and Dietary recommendations Current Exercise Habits: Home exercise routine, Type of exercise: walking, Intensity: Mild  Goals    . Healthy Lifestyle     Walk for exercise Stay hydrated Low carb foods/Lean proteins      Fall Risk Fall Risk  11/06/2017 10/11/2017 08/23/2017 11/06/2016 09/01/2016  Falls in the past year? No No No Yes No  Number falls in past yr: - - - 1 -  Injury with Fall? - - - No -  Risk Factor Category  - - - - -  Comment - - - - -  Risk for fall due to : - - - - -  Follow up - - - Education provided;Falls prevention discussed -   Depression Screen PHQ 2/9 Scores 11/06/2017 10/11/2017 08/23/2017 11/06/2016  PHQ - 2 Score 0 0 0 0  PHQ- 9 Score 0 - - -    Cognitive Function     6CIT Screen 11/06/2017 11/06/2016  What Year? 0 points 0 points  What month? 0 points 0 points  What time? 0 points 0 points  Count back from 20 0 points 0 points    Months in reverse 0  points 0 points  Repeat phrase 0 points 0 points  Total Score 0 0    Immunization History  Administered Date(s) Administered  . Influenza Split 11/11/2012, 09/25/2014  . Influenza, High Dose Seasonal PF 10/15/2013, 10/01/2015  . Influenza-Unspecified 10/01/2016, 08/31/2017  . Pneumococcal Conjugate-13 11/25/2014, 04/03/2015  . Pneumococcal Polysaccharide-23 11/25/2012  . Tdap 08/15/2017  . Zoster 04/10/2011   Screening Tests Health Maintenance  Topic Date Due  . TETANUS/TDAP  08/16/2027  . INFLUENZA VACCINE  Completed  . PNA vac Low Risk Adult  Completed      Plan:    End of life planning; Advance aging; Advanced directives discussed. Copy of current HCPOA/Living Will requested.    I have personally reviewed and noted the following in the patient's chart:   . Medical and social history . Use of alcohol, tobacco or illicit drugs  . Current medications and supplements . Functional ability and status . Nutritional status . Physical activity . Advanced directives . List of other physicians . Hospitalizations, surgeries, and ER visits in previous 12 months . Vitals . Screenings to include cognitive, depression, and falls . Referrals and appointments  In addition, I have reviewed and discussed with patient certain preventive protocols, quality metrics, and best practice recommendations. A written personalized care plan for preventive services as well as general preventive health recommendations were provided to patient.     Varney Biles, LPN  36/05/2946   Reviewed above information.  Agree with assessment and plan.   Dr Nicki Reaper

## 2017-11-06 NOTE — Patient Instructions (Addendum)
  David Atkins , Thank you for taking time to come for your Medicare Wellness Visit. I appreciate your ongoing commitment to your health goals. Please review the following plan we discussed and let me know if I can assist you in the future.   Follow up with Dr. Derrel Nip as needed.    Bring a copy of your [place] and/or Living Will to be scanned into chart.  Have a great day! These are the goals we discussed: Goals    . Healthy Lifestyle     Walk for exercise Stay hydrated Low carb foods/Lean proteins       This is a list of the screening recommended for you and due dates:  Health Maintenance  Topic Date Due  . Tetanus Vaccine  08/16/2027  . Flu Shot  Completed  . Pneumonia vaccines  Completed

## 2017-11-08 LAB — COMPREHENSIVE METABOLIC PANEL

## 2017-11-08 LAB — HEMOGLOBIN A1C
Hgb A1c MFr Bld: 5.6 % of total Hgb (ref <5.7)
Mean Plasma Glucose: 114 (calc)
eAG (mmol/L): 6.3 (calc)

## 2017-11-08 LAB — LDL CHOLESTEROL, DIRECT

## 2017-11-08 LAB — SEDIMENTATION RATE: Sed Rate: 6 mm/h (ref 0–20)

## 2017-11-08 LAB — TSH: TSH: 2 mIU/L (ref 0.40–4.50)

## 2017-11-14 ENCOUNTER — Other Ambulatory Visit: Payer: Self-pay | Admitting: Internal Medicine

## 2017-11-29 DIAGNOSIS — R42 Dizziness and giddiness: Secondary | ICD-10-CM | POA: Diagnosis not present

## 2017-12-11 DIAGNOSIS — H903 Sensorineural hearing loss, bilateral: Secondary | ICD-10-CM | POA: Diagnosis not present

## 2017-12-11 DIAGNOSIS — R42 Dizziness and giddiness: Secondary | ICD-10-CM | POA: Diagnosis not present

## 2017-12-24 ENCOUNTER — Other Ambulatory Visit: Payer: Self-pay | Admitting: Otolaryngology

## 2017-12-24 DIAGNOSIS — H903 Sensorineural hearing loss, bilateral: Secondary | ICD-10-CM | POA: Diagnosis not present

## 2017-12-24 DIAGNOSIS — R42 Dizziness and giddiness: Secondary | ICD-10-CM | POA: Diagnosis not present

## 2017-12-27 DIAGNOSIS — H8112 Benign paroxysmal vertigo, left ear: Secondary | ICD-10-CM | POA: Diagnosis not present

## 2017-12-27 DIAGNOSIS — R42 Dizziness and giddiness: Secondary | ICD-10-CM | POA: Diagnosis not present

## 2017-12-28 ENCOUNTER — Other Ambulatory Visit: Payer: Self-pay | Admitting: Internal Medicine

## 2018-01-02 ENCOUNTER — Ambulatory Visit
Admission: RE | Admit: 2018-01-02 | Discharge: 2018-01-02 | Disposition: A | Discharge status: Home / Self Care | Payer: Medicare Other | Source: Ambulatory Visit | Attending: Otolaryngology | Admitting: Otolaryngology

## 2018-01-02 DIAGNOSIS — I771 Stricture of artery: Secondary | ICD-10-CM | POA: Insufficient documentation

## 2018-01-02 DIAGNOSIS — H903 Sensorineural hearing loss, bilateral: Secondary | ICD-10-CM | POA: Diagnosis not present

## 2018-01-02 DIAGNOSIS — R42 Dizziness and giddiness: Secondary | ICD-10-CM

## 2018-01-02 DIAGNOSIS — I679 Cerebrovascular disease, unspecified: Secondary | ICD-10-CM

## 2018-01-02 DIAGNOSIS — G939 Disorder of brain, unspecified: Secondary | ICD-10-CM | POA: Diagnosis not present

## 2018-01-02 HISTORY — DX: Cerebrovascular disease, unspecified: I67.9

## 2018-01-02 LAB — POCT I-STAT CREATININE: Creatinine, Ser: 0.9 mg/dL (ref 0.61–1.24)

## 2018-01-02 MED ORDER — GADOBENATE DIMEGLUMINE 529 MG/ML IV SOLN
19.0000 mL | Freq: Once | INTRAVENOUS | Status: AC | PRN
  Administered 2018-01-02: 19 mL via INTRAVENOUS

## 2018-01-03 DIAGNOSIS — H8112 Benign paroxysmal vertigo, left ear: Secondary | ICD-10-CM | POA: Diagnosis not present

## 2018-01-03 DIAGNOSIS — R42 Dizziness and giddiness: Secondary | ICD-10-CM | POA: Diagnosis not present

## 2018-01-07 ENCOUNTER — Other Ambulatory Visit: Payer: Self-pay | Admitting: Family

## 2018-01-07 DIAGNOSIS — M79671 Pain in right foot: Secondary | ICD-10-CM

## 2018-01-10 DIAGNOSIS — H8112 Benign paroxysmal vertigo, left ear: Secondary | ICD-10-CM | POA: Diagnosis not present

## 2018-01-10 DIAGNOSIS — R42 Dizziness and giddiness: Secondary | ICD-10-CM | POA: Diagnosis not present

## 2018-01-15 DIAGNOSIS — Z85828 Personal history of other malignant neoplasm of skin: Secondary | ICD-10-CM | POA: Diagnosis not present

## 2018-01-15 DIAGNOSIS — X32XXXA Exposure to sunlight, initial encounter: Secondary | ICD-10-CM | POA: Diagnosis not present

## 2018-01-15 DIAGNOSIS — L570 Actinic keratosis: Secondary | ICD-10-CM | POA: Diagnosis not present

## 2018-01-18 DIAGNOSIS — H8112 Benign paroxysmal vertigo, left ear: Secondary | ICD-10-CM | POA: Diagnosis not present

## 2018-01-18 DIAGNOSIS — R42 Dizziness and giddiness: Secondary | ICD-10-CM | POA: Diagnosis not present

## 2018-01-25 DIAGNOSIS — R42 Dizziness and giddiness: Secondary | ICD-10-CM | POA: Diagnosis not present

## 2018-01-25 DIAGNOSIS — H8112 Benign paroxysmal vertigo, left ear: Secondary | ICD-10-CM | POA: Diagnosis not present

## 2018-01-30 DIAGNOSIS — H8113 Benign paroxysmal vertigo, bilateral: Secondary | ICD-10-CM | POA: Diagnosis not present

## 2018-01-30 DIAGNOSIS — R9082 White matter disease, unspecified: Secondary | ICD-10-CM | POA: Diagnosis not present

## 2018-02-05 ENCOUNTER — Encounter: Payer: Self-pay | Admitting: Internal Medicine

## 2018-02-05 ENCOUNTER — Ambulatory Visit: Payer: Medicare Other | Admitting: Internal Medicine

## 2018-02-05 VITALS — BP 154/80 | HR 64 | Temp 97.4°F | Resp 17 | Ht 69.5 in | Wt 206.6 lb

## 2018-02-05 DIAGNOSIS — E785 Hyperlipidemia, unspecified: Secondary | ICD-10-CM | POA: Diagnosis not present

## 2018-02-05 DIAGNOSIS — I6523 Occlusion and stenosis of bilateral carotid arteries: Secondary | ICD-10-CM | POA: Diagnosis not present

## 2018-02-05 DIAGNOSIS — I679 Cerebrovascular disease, unspecified: Secondary | ICD-10-CM

## 2018-02-05 DIAGNOSIS — H8113 Benign paroxysmal vertigo, bilateral: Secondary | ICD-10-CM | POA: Diagnosis not present

## 2018-02-05 DIAGNOSIS — I10 Essential (primary) hypertension: Secondary | ICD-10-CM

## 2018-02-05 DIAGNOSIS — R7302 Impaired glucose tolerance (oral): Secondary | ICD-10-CM

## 2018-02-05 NOTE — Patient Instructions (Addendum)
Increase  your lisinopril to 40 mg daily .  You can divide the dose if necessary  into 2 doses if needed    Continue metoprolol twice daily  Continue amlodipine as needed for BP> 150    Return for fasting labs in about one week

## 2018-02-05 NOTE — Progress Notes (Signed)
Subjective:  Patient ID: David Atkins, male    DOB: 1929-09-30  Age: 84 y.o. MRN: [id number]  CC: The primary encounter diagnosis was Bilateral carotid artery stenosis. Diagnoses of Hyperlipidemia LDL goal <70, Impaired glucose tolerance, Essential hypertension, Benign paroxysmal positional vertigo due to bilateral vestibular disorder, and Cerebrovascular disease were also pertinent to this visit.  HPI David Atkins presents for follow up on white coat hypertension with diagnosis of hypertension,  hyperlipidemia etc  Hypertension: patient checks blood pressure thrice weekly at home.  Readings have been for the most part > 130/80 at rest and rarely over 150/90 . Patient is following a reduced salt diet most days and is taking medications as prescribed  Saw ENT for persistent vertigo.  Underwent  MRI which showed chronic small vessel changes and saw neurology as well as in house  PT (was seen 4 times)  for balance training . Saw a PT named  "rose"  Who cured his vertigo with a simple head  turn,  However he still has balance issues and is continuining to see her .   No recent  falls  ,  Doing the exercises at home.   Outpatient Medications Prior to Visit  Medication Sig Dispense Refill  . acetaminophen (TYLENOL) 325 MG tablet Take 650 mg by mouth 2 (two) times daily as needed.    Marland Kitchen amLODipine (NORVASC) 2.5 MG tablet Take 1 tablet (2.5 mg total) by mouth daily. 90 tablet 3  . aspirin 81 MG chewable tablet Chew 81 mg by mouth daily.    . Blood Pressure Monitoring (BLOOD PRESSURE MONITOR AUTOMAT) DEVI Use daily to check blood pressure 1 Device 0  . Calcium Carbonate-Vitamin D (CALCIUM + D PO) Take 1 tablet by mouth daily.     . Cholecalciferol (VITAMIN D-3) 1000 UNITS CAPS Take 1 capsule by mouth daily.    . diclofenac sodium (VOLTAREN) 1 % GEL Apply 4 g topically 4 (four) times daily. 1 Tube 3  . finasteride (PROSCAR) 5 MG tablet Take 1 tablet (5 mg total) by mouth daily. 90 tablet 3  .  fluocinonide cream (LIDEX) 0.24 % Apply 1 application topically 2 (two) times daily.    Marland Kitchen lisinopril (PRINIVIL,ZESTRIL) 20 MG tablet TAKE 1 TABLET BY MOUTH  DAILY 90 tablet 1  . meclizine (ANTIVERT) 12.5 MG tablet TAKE 1 TABLET (12.5 MG TOTAL) BY MOUTH 3 (THREE) TIMES DAILY AS NEEDED. 30 tablet 1  . metoprolol tartrate (LOPRESSOR) 25 MG tablet TAKE 1/2 TABLET (25 MG TOTAL) BY MOUTH 2 (TWO) TIMES DAILY. 180 tablet 1  . omeprazole (PRILOSEC) 20 MG capsule TAKE 1 CAPSULE (20 MG TOTAL) BY MOUTH DAILY. 90 capsule 1  . omeprazole (PRILOSEC) 20 MG capsule TAKE 1 CAPSULE BY MOUTH  DAILY 90 capsule 1  . polyethylene glycol powder (GLYCOLAX/MIRALAX) powder Take 17 g by mouth once. Dissolve 1 capful of power into any liquid and drink once daily (Patient taking differently: Take 17 g by mouth 2 (two) times daily. Dissolve 1 capful of power into any liquid and drink once daily) 500 g 0  . rOPINIRole (REQUIP) 0.25 MG tablet Take 1 tablet (0.25 mg total) by mouth at bedtime. Increase weekly if needed 90 tablet 0  . simvastatin (ZOCOR) 20 MG tablet TAKE 1 TABLET BY MOUTH AT  BEDTIME 90 tablet 1  . tamsulosin (FLOMAX) 0.4 MG CAPS capsule TAKE ONE CAPSULE BY MOUTH EVERY DAY AFTER BREAKFAST 90 capsule 2  . tolterodine (DETROL LA) 4 MG 24 hr capsule Take  4 mg by mouth daily.    . predniSONE (DELTASONE) 10 MG tablet 6 tablets on Day 1 , then reduce by 1 tablet daily until gone (Patient not taking: Reported on 02/05/2018) 21 tablet 0   No facility-administered medications prior to visit.     Review of Systems;  Patient denies headache, fevers, malaise, unintentional weight loss, skin rash, eye pain, sinus congestion and sinus pain, sore throat, dysphagia,  hemoptysis , cough, dyspnea, wheezing, chest pain, palpitations, orthopnea, edema, abdominal pain, nausea, melena, diarrhea, constipation, flank pain, dysuria, hematuria, urinary  Frequency, nocturia, numbness, tingling, seizures,  Focal weakness, Loss of consciousness,   Tremor, insomnia, depression, anxiety, and suicidal ideation.      Objective:  BP (!) 154/80 (BP Location: Left Arm, Patient Position: Sitting, Cuff Size: Normal)   Pulse 64   Temp (!) 97.4 F (36.3 C) (Oral)   Resp 17   Ht 5' 9.5" (1.765 m)   Wt 206 lb 9.6 oz (93.7 kg)   SpO2 97%   BMI 30.07 kg/m   BP Readings from Last 3 Encounters:  02/05/18 (!) 154/80  11/06/17 140/70  11/05/17 140/70    Wt Readings from Last 3 Encounters:  02/05/18 206 lb 9.6 oz (93.7 kg)  11/06/17 207 lb 12.8 oz (94.3 kg)  11/05/17 207 lb 4 oz (94 kg)    General appearance: alert, cooperative and appears stated age Ears: normal TM's and external ear canals both ears Throat: lips, mucosa, and tongue normal; teeth and gums normal Neck: no adenopathy, no carotid bruit, supple, symmetrical, trachea midline and thyroid not enlarged, symmetric, no tenderness/mass/nodules Back: symmetric, no curvature. ROM normal. No CVA tenderness. Lungs: clear to auscultation bilaterally Heart: regular rate and rhythm, S1, S2 normal, no murmur, click, rub or gallop Abdomen: soft, non-tender; bowel sounds normal; no masses,  no organomegaly Pulses: 2+ and symmetric Skin: Skin color, texture, turgor normal. No rashes or lesions Lymph nodes: Cervical, supraclavicular, and axillary nodes normal.  Lab Results  Component Value Date   HGBA1C 5.6 11/02/2017   HGBA1C 5.8 10/17/2016   HGBA1C 5.8 04/17/2013    Lab Results  Component Value Date   CREATININE 0.90 01/02/2018   CREATININE 0.98 10/17/2016   CREATININE 1.01 10/31/2015    Lab Results  Component Value Date   WBC 7.8 10/17/2016   HGB 15.5 10/17/2016   HCT 46.3 10/17/2016   PLT 208.0 10/17/2016   GLUCOSE CANCELED 11/02/2017   CHOL 124 12/10/2015   TRIG 75.0 12/10/2015   HDL 43.60 12/10/2015   LDLDIRECT CANCELED 11/02/2017   LDLCALC 65 12/10/2015   ALT 29 10/17/2016   AST 27 10/17/2016   NA 142 10/17/2016   K 4.3 10/17/2016   CL 105 10/17/2016    CREATININE 0.90 01/02/2018   BUN 16 10/17/2016   CO2 30 10/17/2016   TSH 2.00 11/02/2017   HGBA1C 5.6 11/02/2017    Mr Brain/iac W Wo Contrast  Result Date: 01/02/2018 CLINICAL DATA:  84 year old male with bilateral hearing loss. Dizziness. Balance issues for several months. Denies headache. EXAM: MRI HEAD WITHOUT AND WITH CONTRAST TECHNIQUE: Multiplanar, multiecho pulse sequences of the brain and surrounding structures were obtained without and with intravenous contrast. CONTRAST:  63mL MULTIHANCE GADOBENATE DIMEGLUMINE 529 MG/ML IV SOLN COMPARISON:  Head CT without contrast 01/06/2014. FINDINGS: Brain: Cerebral volume is within normal limits for age. No restricted diffusion to suggest acute infarction. No midline shift, mass effect, evidence of mass lesion, ventriculomegaly, extra-axial collection or acute intracranial hemorrhage. Cervicomedullary junction and  pituitary are within normal limits. There are several chronic micro hemorrhages in the left frontal lobe and left parietal lobe. Patchy bilateral cerebral white matter T2 and FLAIR hyperintensity which in some places most resembles chronic white matter lacunar infarcts (series 7, image 44). Mild to moderate T2 heterogeneity in the bilateral deep gray matter nuclei which appears mostly related to increased perivascular spaces. Negative cerebellum. No abnormal enhancement identified. No dural thickening. Vascular: Major intracranial vascular flow voids are preserved. There is generalized intracranial artery tortuosity, and the distal left vertebral artery is dominant with tortuosity. Skull and upper cervical spine: Negative visible cervical spine. Normal bone marrow signal. Sinuses/Orbits: Postoperative changes to both globes. Otherwise negative orbits soft tissues. Trace paranasal sinus mucosal thickening. Negative scalp soft tissues. Other: Dedicated internal auditory canal imaging. Normal cerebellopontine angles. Normal bilateral cisternal and  intracanalicular 7th and 8th cranial nerve segments. Symmetric T2 signal in the bilateral cochlea and vestibular structures. No abnormal enhancement identified. Mastoid air cells are clear. Normal stylomastoid foramina. No skull base abnormality identified. Both parotid glands appear normal. IMPRESSION: 1. Negative IAC imaging. 2. Several chronic micro hemorrhages in the left hemisphere with bilateral cerebral white matter and deep gray matter signal changes most compatible with chronic small vessel disease. 3. Generalized intracranial artery tortuosity, including the dominant distal left vertebral artery. Electronically Signed   By: Genevie Ann M.D.   On: 01/02/2018 14:52    Assessment & Plan:   Problem List Items Addressed This Visit    Bilateral carotid artery stenosis - Primary    < 39% stenosis by July 2017 U/s. Continue asa, simvastatin and prn surveillance.       BPPV (benign paroxysmal positional vertigo)    Recent episode resulted in CT head ,  Neurology and ENT evaluations and PT.  Now resolved.       Cerebrovascular disease    Noted on MRI of brain done during episode of vertigo.  Discussed with patient.  BP control and continued use of statins advised       Hyperlipidemia LDL goal <70    With CAD and PAD.  Taking a statin.   Labs ordered Dec 2018 were not done and are needed ASAP.    Lab Results  Component Value Date   CHOL 124 12/10/2015   HDL 43.60 12/10/2015   LDLCALC 65 12/10/2015   LDLDIRECT CANCELED 11/02/2017   TRIG 75.0 12/10/2015   CHOLHDL 3 12/10/2015   Lab Results  Component Value Date   ALT 29 10/17/2016   AST 27 10/17/2016   ALKPHOS 67 10/17/2016   BILITOT 0.5 10/17/2016         Relevant Orders   Lipid panel   Hypertension    Not at goal.  Increasing lisinopril to 40 mg daily.  Return one week for fasting labs  Lab Results  Component Value Date   CREATININE 0.90 01/02/2018   No results found for: MICROALBUR, [id number]       Relevant Orders    Comprehensive metabolic panel   Microalbumin / creatinine urine ratio   Impaired glucose tolerance    Last a1c was normal range. BMI, Low Gi diet reviewed with patient and need for regular exercise.        Relevant Orders   Hemoglobin A1c     A total of 25 minutes of face to face time was spent with patient more than half of which was spent in counselling about the above mentioned conditions  and coordination of care  I have discontinued Kayce Mccowan's predniSONE. I am also having him maintain his tolterodine, acetaminophen, Calcium Carbonate-Vitamin D (CALCIUM + D PO), Blood Pressure Monitor Automat, Vitamin D-3, polyethylene glycol powder, aspirin, metoprolol tartrate, finasteride, fluocinonide cream, diclofenac sodium, meclizine, amLODipine, rOPINIRole, omeprazole, tamsulosin, lisinopril, simvastatin, and omeprazole.  No orders of the defined types were placed in this encounter.   Medications Discontinued During This Encounter  Medication Reason  . predniSONE (DELTASONE) 10 MG tablet Completed Course    Follow-up: Return in about 6 months (around 08/08/2018) for CPE/hypertension.   [name], MD

## 2018-02-07 DIAGNOSIS — I679 Cerebrovascular disease, unspecified: Secondary | ICD-10-CM | POA: Insufficient documentation

## 2018-02-07 NOTE — Assessment & Plan Note (Signed)
Last a1c was normal range. BMI, Low Gi diet reviewed with patient and need for regular exercise.

## 2018-02-07 NOTE — Assessment & Plan Note (Signed)
With CAD and PAD.  Taking a statin.   Labs ordered Dec 2018 were not done and are needed ASAP.    Lab Results  Component Value Date   CHOL 124 12/10/2015   HDL 43.60 12/10/2015   LDLCALC 65 12/10/2015   LDLDIRECT CANCELED 11/02/2017   TRIG 75.0 12/10/2015   CHOLHDL 3 12/10/2015   Lab Results  Component Value Date   ALT 29 10/17/2016   AST 27 10/17/2016   ALKPHOS 67 10/17/2016   BILITOT 0.5 10/17/2016

## 2018-02-07 NOTE — Assessment & Plan Note (Signed)
Not at goal.  Increasing lisinopril to 40 mg daily.  Return one week for fasting labs  Lab Results  Component Value Date   CREATININE 0.90 01/02/2018   No results found for: Derl Barrow

## 2018-02-07 NOTE — Assessment & Plan Note (Signed)
Noted on MRI of brain done during episode of vertigo.  Discussed with patient.  BP control and continued use of statins advised

## 2018-02-07 NOTE — Assessment & Plan Note (Signed)
Recent episode resulted in CT head ,  Neurology and ENT evaluations and PT.  Now resolved.

## 2018-02-07 NOTE — Assessment & Plan Note (Signed)
<   39% stenosis by July 2017 U/s. Continue asa, simvastatin and prn surveillance.  

## 2018-02-08 ENCOUNTER — Other Ambulatory Visit (INDEPENDENT_AMBULATORY_CARE_PROVIDER_SITE_OTHER): Payer: Medicare Other

## 2018-02-08 DIAGNOSIS — E785 Hyperlipidemia, unspecified: Secondary | ICD-10-CM | POA: Diagnosis not present

## 2018-02-08 DIAGNOSIS — R7302 Impaired glucose tolerance (oral): Secondary | ICD-10-CM

## 2018-02-08 DIAGNOSIS — I10 Essential (primary) hypertension: Secondary | ICD-10-CM | POA: Diagnosis not present

## 2018-02-08 LAB — COMPREHENSIVE METABOLIC PANEL
ALT: 20 U/L (ref 0–53)
AST: 22 U/L (ref 0–37)
Albumin: 3.9 g/dL (ref 3.5–5.2)
Alkaline Phosphatase: 63 U/L (ref 39–117)
BUN: 14 mg/dL (ref 6–23)
CO2: 29 mEq/L (ref 19–32)
Calcium: 9.3 mg/dL (ref 8.4–10.5)
Chloride: 106 mEq/L (ref 96–112)
Creatinine, Ser: 0.88 mg/dL (ref 0.40–1.50)
GFR: 87.12 mL/min (ref >60.00)
Glucose, Bld: 92 mg/dL (ref 70–99)
Potassium: 4 mEq/L (ref 3.5–5.1)
Sodium: 142 mEq/L (ref 135–145)
Total Bilirubin: 0.7 mg/dL (ref 0.2–1.2)
Total Protein: 6.8 g/dL (ref 6.0–8.3)

## 2018-02-08 LAB — MICROALBUMIN / CREATININE URINE RATIO
Creatinine,U: 57.6 mg/dL
Microalb Creat Ratio: 1.2 mg/g (ref 0.0–30.0)
Microalb, Ur: <0.7 mg/dL (ref 0.0–1.9)

## 2018-02-08 LAB — LIPID PANEL
Cholesterol: 117 mg/dL (ref 0–200)
HDL: 39.1 mg/dL (ref >39.00)
LDL Cholesterol: 59 mg/dL (ref 0–99)
NonHDL: 77.7
Total CHOL/HDL Ratio: 3
Triglycerides: 93 mg/dL (ref 0.0–149.0)
VLDL: 18.6 mg/dL (ref 0.0–40.0)

## 2018-02-08 LAB — HEMOGLOBIN A1C: Hgb A1c MFr Bld: 5.7 % (ref 4.6–6.5)

## 2018-02-12 ENCOUNTER — Other Ambulatory Visit: Payer: Medicare Other

## 2018-04-10 ENCOUNTER — Telehealth: Payer: Self-pay | Admitting: Family Medicine

## 2018-04-10 MED ORDER — OXYBUTYNIN CHLORIDE 5 MG PO TABS: 5.0000 mg | ORAL_TABLET | Freq: Two times a day (BID) | ORAL | 11 refills | Status: DC

## 2018-04-10 NOTE — Telephone Encounter (Signed)
-----   Message from Festus Aloe, MD sent at 04/09/2018  4:44 PM EDT ----- Patient called the [facility] office, but he transferred care to [facility].  He would like to try oxybutynin.  Please send in oxybutynin 5 mg, one by mouth twice a day, #60, 11 refills.  Please remind the patient to stop Detrol when he start oxybutynin (don't take both together).   Thanks.

## 2018-05-06 DIAGNOSIS — R9082 White matter disease, unspecified: Secondary | ICD-10-CM | POA: Diagnosis not present

## 2018-05-06 DIAGNOSIS — H8113 Benign paroxysmal vertigo, bilateral: Secondary | ICD-10-CM | POA: Diagnosis not present

## 2018-05-06 DIAGNOSIS — R2689 Other abnormalities of gait and mobility: Secondary | ICD-10-CM | POA: Diagnosis not present

## 2018-05-20 DIAGNOSIS — G4733 Obstructive sleep apnea (adult) (pediatric): Secondary | ICD-10-CM | POA: Diagnosis not present

## 2018-05-31 ENCOUNTER — Other Ambulatory Visit: Payer: Self-pay | Admitting: Internal Medicine

## 2018-07-29 ENCOUNTER — Other Ambulatory Visit: Payer: Self-pay

## 2018-07-29 NOTE — Patient Outreach (Signed)
[facility]) Care Management  07/29/2018  Chucky Homes 1929-08-09 [id number]   Medication Adherence call to Mr. Ayaan Shutes patient received a deliver from [facility] on 05/31/18 for a 90 days supply on Simvastatin 20 mg and Lisinopril 20 mg. Mr. Nicodemus is showing past due under [place].  [facility] Management Direct Dial [phone]  Fax [phone] Peyson [email or web address]

## 2018-08-02 DIAGNOSIS — G4733 Obstructive sleep apnea (adult) (pediatric): Secondary | ICD-10-CM | POA: Diagnosis not present

## 2018-08-07 ENCOUNTER — Other Ambulatory Visit: Payer: Self-pay | Admitting: Internal Medicine

## 2018-08-09 ENCOUNTER — Other Ambulatory Visit: Payer: Self-pay | Admitting: Internal Medicine

## 2018-08-09 ENCOUNTER — Encounter: Payer: Medicare Other | Admitting: Internal Medicine

## 2018-08-13 ENCOUNTER — Encounter: Payer: Self-pay | Admitting: Internal Medicine

## 2018-08-13 ENCOUNTER — Ambulatory Visit (INDEPENDENT_AMBULATORY_CARE_PROVIDER_SITE_OTHER): Payer: Medicare Other | Admitting: Internal Medicine

## 2018-08-13 ENCOUNTER — Encounter: Payer: Medicare Other | Admitting: Internal Medicine

## 2018-08-13 VITALS — BP 130/68 | HR 60 | Temp 98.3°F | Resp 15 | Ht 69.5 in | Wt 202.0 lb

## 2018-08-13 DIAGNOSIS — N401 Enlarged prostate with lower urinary tract symptoms: Secondary | ICD-10-CM

## 2018-08-13 DIAGNOSIS — I6523 Occlusion and stenosis of bilateral carotid arteries: Secondary | ICD-10-CM

## 2018-08-13 DIAGNOSIS — Z0000 Encounter for general adult medical examination without abnormal findings: Secondary | ICD-10-CM

## 2018-08-13 DIAGNOSIS — Z9989 Dependence on other enabling machines and devices: Secondary | ICD-10-CM

## 2018-08-13 DIAGNOSIS — R351 Nocturia: Secondary | ICD-10-CM | POA: Diagnosis not present

## 2018-08-13 DIAGNOSIS — I10 Essential (primary) hypertension: Secondary | ICD-10-CM | POA: Diagnosis not present

## 2018-08-13 DIAGNOSIS — E785 Hyperlipidemia, unspecified: Secondary | ICD-10-CM | POA: Diagnosis not present

## 2018-08-13 DIAGNOSIS — G4733 Obstructive sleep apnea (adult) (pediatric): Secondary | ICD-10-CM

## 2018-08-13 DIAGNOSIS — Z23 Encounter for immunization: Secondary | ICD-10-CM | POA: Diagnosis not present

## 2018-08-13 DIAGNOSIS — R350 Frequency of micturition: Secondary | ICD-10-CM

## 2018-08-13 LAB — COMPREHENSIVE METABOLIC PANEL
ALT: 23 U/L (ref 0–53)
AST: 23 U/L (ref 0–37)
Albumin: 3.9 g/dL (ref 3.5–5.2)
Alkaline Phosphatase: 57 U/L (ref 39–117)
BUN: 16 mg/dL (ref 6–23)
CO2: 29 mEq/L (ref 19–32)
Calcium: 9 mg/dL (ref 8.4–10.5)
Chloride: 107 mEq/L (ref 96–112)
Creatinine, Ser: 1.02 mg/dL (ref 0.40–1.50)
GFR: 73.38 mL/min (ref >60.00)
Glucose, Bld: 121 mg/dL — ABNORMAL HIGH (ref 70–99)
Potassium: 4 mEq/L (ref 3.5–5.1)
Sodium: 141 mEq/L (ref 135–145)
Total Bilirubin: 0.5 mg/dL (ref 0.2–1.2)
Total Protein: 6.7 g/dL (ref 6.0–8.3)

## 2018-08-13 MED ORDER — ZOSTER VAC RECOMB ADJUVANTED 50 MCG/0.5ML IM SUSR: 0.5000 mL | Freq: Once | INTRAMUSCULAR | 1 refills | Status: AC

## 2018-08-13 NOTE — Progress Notes (Signed)
Patient ID: David Atkins, male    DOB: 22-May-1929  Age: 84 y.o. MRN: [id number]  The patient is here for annual preventive examination and management of other chronic and acute problems.   The risk factors are reflected in the social history.  The roster of all physicians providing medical care to patient - is listed in the Snapshot section of the chart.  Activities of daily living:  The patient is 100% independent in all ADLs: dressing, toileting, feeding as well as independent mobility  Home safety : The patient has smoke detectors in the home. They wear seatbelts.  There are no firearms at home. There is no violence in the home.   There is no risks for hepatitis, STDs or HIV. There is no   history of blood transfusion. They have no travel history to infectious disease endemic areas of the world.  The patient has seen their dentist in the last six month. They have seen their eye doctor in the last year. They admit to slight hearing difficulty with regard to whispered voices and some television programs.  They have deferred audiologic testing in the last year.  They do not  have excessive sun exposure. Discussed the need for sun protection: hats, long sleeves and use of sunscreen if there is significant sun exposure.   Diet: the importance of a healthy diet is discussed. They do have a healthy diet.  The benefits of regular aerobic exercise were discussed. He does not exercise regularly or walk  regularly    Depression screen: there are no signs or vegative symptoms of depression- irritability, change in appetite, anhedonia, sadness/tearfullness.  Cognitive assessment: the patient manages all their financial and personal affairs and is actively engaged. They could relate day,date,year and events; recalled 2/3 objects at 3 minutes; performed clock-face test normally.  The following portions of the patient's history were reviewed and updated as appropriate: allergies, current medications, past  family history, past medical history,  past surgical history, past social history  and problem list.  Visual acuity was not assessed per patient preference since she has regular follow up with her ophthalmologist. Hearing and body mass index were assessed and reviewed.   During the course of the visit the patient was educated and counseled about appropriate screening and preventive services including : fall prevention , diabetes screening, nutrition counseling, colorectal cancer screening, and recommended immunizations.    CC: The primary encounter diagnosis was Essential hypertension. Diagnoses of Nocturia, Need for influenza vaccination, Bilateral carotid artery stenosis, Encounter for preventive health examination, Hyperlipidemia LDL goal <70, OSA on CPAP, and Benign prostatic hyperplasia with urinary frequency were also pertinent to this visit.  Balance and walking is getting worse,  improved with prior PT sessions  that he had with a therapist named  Rose, but he does not recall where he had the PT    OSA :  tolerating CPAP  Daily   moving bowels daily   Sees Dr Evorn Gong regularly ; using aquaphor nightly for itching due to psoriasis (?) itching and redness   Resolved   [facility] .  Still night driving without difficulty,  no accidents     No recent falls,  But not walking daily for exercise,  Feels his balance is getting poor .    Bathroom has fully equipped with handicapped features  One story house,  No steps.   Patient has a living will and advance directives   Fluid retention  Rare.  Nocturia  every few weeks , has a night  Voiding  5-6 times  For one night.  No fluid pills.  Using diclofenac gel prn  For arthritis pain "Miracle gel"   History Escher has a past medical history of 3-vessel coronary artery disease (2006), basal cell, BPH (benign prostatic hyperplasia), GERD (gastroesophageal reflux disease), Hyperlipidemia, Hypertension, Lumbar stenosis  (12/03/13), Myocardial infarction ([place]) (2006), OAB (overactive bladder), Osteoporosis (2010), Sleep apnea, and Spondylolisthesis of lumbar region (11/23/13).   He has a past surgical history that includes Appendectomy (1951); Nasal septoplasty w/ turbinoplasty (1981); Coronary artery bypass graft (2006); Colonoscopy; Tonsillectomy; and Colonoscopy with propofol (N/A, 11/15/2015).   His family history includes Hyperlipidemia in his mother; Hypertension in his father and mother.He reports that he quit smoking about 55 years ago. His smoking use included cigarettes. He has never used smokeless tobacco. He reports that he does not drink alcohol or use drugs.  Outpatient Medications Prior to Visit  Medication Sig Dispense Refill  . acetaminophen (TYLENOL) 325 MG tablet Take 650 mg by mouth 2 (two) times daily as needed.    Marland Kitchen amLODipine (NORVASC) 2.5 MG tablet Take 1 tablet (2.5 mg total) by mouth daily. 90 tablet 3  . aspirin 81 MG chewable tablet Chew 81 mg by mouth daily.    . Blood Pressure Monitoring (BLOOD PRESSURE MONITOR AUTOMAT) DEVI Use daily to check blood pressure 1 Device 0  . Calcium Carbonate-Vitamin D (CALCIUM + D PO) Take 1 tablet by mouth daily.     . Cholecalciferol (VITAMIN D-3) 1000 UNITS CAPS Take 1 capsule by mouth daily.    . diclofenac sodium (VOLTAREN) 1 % GEL Apply 4 g topically 4 (four) times daily. 1 Tube 3  . finasteride (PROSCAR) 5 MG tablet TAKE 1 TABLET BY MOUTH  DAILY 90 tablet 1  . fluocinonide cream (LIDEX) 2.97 % Apply 1 application topically 2 (two) times daily.    Marland Kitchen lisinopril (PRINIVIL,ZESTRIL) 20 MG tablet TAKE 1 TABLET BY MOUTH  DAILY 90 tablet 1  . meclizine (ANTIVERT) 12.5 MG tablet TAKE 1 TABLET (12.5 MG TOTAL) BY MOUTH 3 (THREE) TIMES DAILY AS NEEDED. 30 tablet 1  . metoprolol tartrate (LOPRESSOR) 25 MG tablet TAKE ONE-HALF TABLET BY  MOUTH TWO TIMES DAILY 90 tablet 1  . omeprazole (PRILOSEC) 20 MG capsule TAKE 1 CAPSULE (20 MG TOTAL) BY MOUTH DAILY. 90  capsule 1  . oxybutynin (DITROPAN) 5 MG tablet Take 1 tablet (5 mg total) by mouth 2 (two) times daily. 60 tablet 11  . polyethylene glycol powder (GLYCOLAX/MIRALAX) powder Take 17 g by mouth once. Dissolve 1 capful of power into any liquid and drink once daily (Patient taking differently: Take 17 g by mouth 2 (two) times daily. Dissolve 1 capful of power into any liquid and drink once daily) 500 g 0  . rOPINIRole (REQUIP) 0.25 MG tablet TAKE 1 TABLET (0.25 MG TOTAL) BY MOUTH AT BEDTIME. INCREASE WEEKLY IF NEEDED 90 tablet 1  . simvastatin (ZOCOR) 20 MG tablet TAKE 1 TABLET BY MOUTH AT  BEDTIME 90 tablet 1  . tamsulosin (FLOMAX) 0.4 MG CAPS capsule TAKE ONE CAPSULE BY MOUTH EVERY DAY AFTER BREAKFAST 90 capsule 2  . omeprazole (PRILOSEC) 20 MG capsule TAKE 1 CAPSULE BY MOUTH  DAILY (Patient not taking: Reported on 08/13/2018) 90 capsule 1  . omeprazole (PRILOSEC) 20 MG capsule TAKE 1 CAPSULE BY MOUTH  DAILY (Patient not taking: Reported on 08/13/2018) 90 capsule 1   No facility-administered medications prior to visit.  Review of Systems   Patient denies headache, fevers, malaise, unintentional weight loss, skin rash, eye pain, sinus congestion and sinus pain, sore throat, dysphagia,  hemoptysis , cough, dyspnea, wheezing, chest pain, palpitations, orthopnea, edema, abdominal pain, nausea, melena, diarrhea, constipation, flank pain, dysuria, hematuria, urinary  Frequency, nocturia, numbness, tingling, seizures,  Focal weakness, Loss of consciousness,  Tremor, insomnia, depression, anxiety, and suicidal ideation.      Objective:  BP 130/68 (BP Location: Left Arm, Patient Position: Sitting, Cuff Size: Normal)   Pulse 60   Temp 98.3 F (36.8 C) (Oral)   Resp 15   Ht 5' 9.5" (1.765 m)   Wt 202 lb (91.6 kg)   SpO2 95%   BMI 29.40 kg/m   Physical Exam   General appearance: alert, cooperative and appears stated age Ears: normal TM's and external ear canals both ears Throat: lips, mucosa,  and tongue normal; teeth and gums normal Neck: no adenopathy, no carotid bruit, supple, symmetrical, trachea midline and thyroid not enlarged, symmetric, no tenderness/mass/nodules Back: symmetric, no curvature. ROM normal. No CVA tenderness. Lungs: clear to auscultation bilaterally Heart: regular rate and rhythm, S1, S2 normal, no murmur, click, rub or gallop Abdomen: soft, non-tender; bowel sounds normal; no masses,  no organomegaly Pulses: 2+ and symmetric Skin: Skin color, texture, turgor normal. No rashes or lesions Lymph nodes: Cervical, supraclavicular, and axillary nodes normal.    Assessment & Plan:   Problem List Items Addressed This Visit    Bilateral carotid artery stenosis    < 39% stenosis by July 2017 U/s. Continue asa, simvastatin and prn surveillance.       BPH (benign prostatic hyperplasia)    Managed with finasteride and tamsulosin.  He has been having periodic  intermittent episodes of nocturia 3-4 times per night.  patient given sterile container to collect his urine during next episode       Encounter for preventive health examination    age appropriate education and counseling updated, referrals for preventative services and immunizations addressed, dietary and smoking counseling addressed, most recent labs reviewed.  I have personally reviewed and have noted:  1) the patient's medical and social history 2) The pt's use of alcohol, tobacco, and illicit drugs 3) The patient's current medications and supplements 4) Functional ability including ADL's, fall risk, home safety risk, hearing and visual impairment 5) Diet and physical activities 6) Evidence for depression or mood disorder 7) The patient's height, weight, and BMI have been recorded in the chart  I have made referrals, and provided counseling and education based on review of the above        Hyperlipidemia LDL goal <70    With CAD, CVD  and PAD.  LDL is at goal on statin  And LFTS are normal.    Lab Results  Component Value Date   CHOL 117 02/08/2018   HDL 39.10 02/08/2018   LDLCALC 59 02/08/2018   LDLDIRECT CANCELED 11/02/2017   TRIG 93.0 02/08/2018   CHOLHDL 3 02/08/2018   Lab Results  Component Value Date   ALT 23 08/13/2018   AST 23 08/13/2018   ALKPHOS 57 08/13/2018   BILITOT 0.5 08/13/2018         Hypertension - Primary    Well controlled on current regimen. Renal function stable, no changes today.  Lab Results  Component Value Date   CREATININE 1.02 08/13/2018   Lab Results  Component Value Date   NA 141 08/13/2018   K 4.0 08/13/2018   CL  107 08/13/2018   CO2 29 08/13/2018         Relevant Orders   Comprehensive metabolic panel (Completed)   OSA on CPAP    Diagnosed by sleep study. he is wearing his CPAP every night a minimum of 6 hours per night and notes improved daytime wakefulness and decreased fatigue        Other Visit Diagnoses    Nocturia       Relevant Orders   Urinalysis, Routine w reflex microscopic   Urine Culture   Need for influenza vaccination       Relevant Orders   Flu vaccine HIGH DOSE PF (Fluzone High dose) (Completed)      I am having [name] start on Zoster Vaccine Adjuvanted. I am also having him maintain his acetaminophen, Calcium Carbonate-Vitamin D (CALCIUM + D PO), Blood Pressure Monitor Automat, Vitamin D-3, polyethylene glycol powder, aspirin, fluocinonide cream, diclofenac sodium, meclizine, amLODipine, omeprazole, oxybutynin, metoprolol tartrate, lisinopril, simvastatin, finasteride, tamsulosin, and rOPINIRole.  Meds ordered this encounter  Medications  . Zoster Vaccine Adjuvanted [facility]) injection    Sig: Inject 0.5 mLs into the muscle once for 1 dose.    Dispense:  1 each    Refill:  1    Medications Discontinued During This Encounter  Medication Reason  . omeprazole (PRILOSEC) 20 MG capsule Duplicate  . omeprazole (PRILOSEC) 20 MG capsule Duplicate    Follow-up: Return in about 6  months (around 02/11/2019).   [name], MD

## 2018-08-13 NOTE — Patient Instructions (Signed)
You are doing well!    The next time you have a night of frequent urination,  Bring me a sample of your urine in the sterile container I have you today   Talk to Earlie Server about the "DNR" order AND we can schedule an office visit to discuss  You need to walk daily for prevent your muscles from getting weaker   Bring me a copy of Your Living will  You received the flu vaccine today    Health Maintenance, Male A healthy lifestyle and preventive care is important for your health and wellness. Ask your health care provider about what schedule of regular examinations is right for you. What should I know about weight and diet? Eat a Healthy Diet  Eat plenty of vegetables, fruits, whole grains, low-fat dairy products, and lean protein.  Do not eat a lot of foods high in solid fats, added sugars, or salt.  Maintain a Healthy Weight Regular exercise can help you achieve or maintain a healthy weight. You should:  Do at least 150 minutes of exercise each week. The exercise should increase your heart rate and make you sweat (moderate-intensity exercise).  Do strength-training exercises at least twice a week.  Watch Your Levels of Cholesterol and Blood Lipids  Have your blood tested for lipids and cholesterol every 5 years starting at 82 years of age. If you are at high risk for heart disease, you should start having your blood tested when you are 82 years old. You may need to have your cholesterol levels checked more often if: ? Your lipid or cholesterol levels are high. ? You are older than 82 years of age. ? You are at high risk for heart disease.  What should I know about cancer screening? Many types of cancers can be detected early and may often be prevented. Lung Cancer  You should be screened every year for lung cancer if: ? You are a current smoker who has smoked for at least 30 years. ? You are a former smoker who has quit within the past 15 years.  Talk to your health care  provider about your screening options, when you should start screening, and how often you should be screened.  Colorectal Cancer  Routine colorectal cancer screening usually begins at 82 years of age and should be repeated every 5-10 years until you are 82 years old. You may need to be screened more often if early forms of precancerous polyps or small growths are found. Your health care provider may recommend screening at an earlier age if you have risk factors for colon cancer.  Your health care provider may recommend using home test kits to check for hidden blood in the stool.  A small camera at the end of a tube can be used to examine your colon (sigmoidoscopy or colonoscopy). This checks for the earliest forms of colorectal cancer.  Prostate and Testicular Cancer  Depending on your age and overall health, your health care provider may do certain tests to screen for prostate and testicular cancer.  Talk to your health care provider about any symptoms or concerns you have about testicular or prostate cancer.  Skin Cancer  Check your skin from head to toe regularly.  Tell your health care provider about any new moles or changes in moles, especially if: ? There is a change in a mole's size, shape, or color. ? You have a mole that is larger than a pencil eraser.  Always use sunscreen. Apply sunscreen liberally  and repeat throughout the day.  Protect yourself by wearing long sleeves, pants, a wide-brimmed hat, and sunglasses when outside.  What should I know about heart disease, diabetes, and high blood pressure?  If you are 25-42 years of age, have your blood pressure checked every 3-5 years. If you are 29 years of age or older, have your blood pressure checked every year. You should have your blood pressure measured twice-once when you are at a hospital or clinic, and once when you are not at a hospital or clinic. Record the average of the two measurements. To check your blood pressure  when you are not at a hospital or clinic, you can use: ? An automated blood pressure machine at a pharmacy. ? A home blood pressure monitor.  Talk to your health care provider about your target blood pressure.  If you are between 53-62 years old, ask your health care provider if you should take aspirin to prevent heart disease.  Have regular diabetes screenings by checking your fasting blood sugar level. ? If you are at a normal weight and have a low risk for diabetes, have this test once every three years after the age of 58. ? If you are overweight and have a high risk for diabetes, consider being tested at a younger age or more often.  A one-time screening for abdominal aortic aneurysm (AAA) by ultrasound is recommended for men aged 66-75 years who are current or former smokers. What should I know about preventing infection? Hepatitis B If you have a higher risk for hepatitis B, you should be screened for this virus. Talk with your health care provider to find out if you are at risk for hepatitis B infection. Hepatitis C Blood testing is recommended for:  Everyone born from [birth year] through 1963.  Anyone with known risk factors for hepatitis C.  Sexually Transmitted Diseases (STDs)  You should be screened each year for STDs including gonorrhea and chlamydia if: ? You are sexually active and are younger than 82 years of age. ? You are older than 82 years of age and your health care provider tells you that you are at risk for this type of infection. ? Your sexual activity has changed since you were last screened and you are at an increased risk for chlamydia or gonorrhea. Ask your health care provider if you are at risk.  Talk with your health care provider about whether you are at high risk of being infected with HIV. Your health care provider may recommend a prescription medicine to help prevent HIV infection.  What else can I do?  Schedule regular health, dental, and eye  exams.  Stay current with your vaccines (immunizations).  Do not use any tobacco products, such as cigarettes, chewing tobacco, and e-cigarettes. If you need help quitting, ask your health care provider.  Limit alcohol intake to no more than 2 drinks per day. One drink equals 12 ounces of beer, 5 ounces of wine, or 1 ounces of hard liquor.  Do not use street drugs.  Do not share needles.  Ask your health care provider for help if you need support or information about quitting drugs.  Tell your health care provider if you often feel depressed.  Tell your health care provider if you have ever been abused or do not feel safe at home. This information is not intended to replace advice given to you by your health care provider. Make sure you discuss any questions you have with your health  care provider. Document Released: 05/18/2008 Document Revised: 07/19/2016 Document Reviewed: 08/24/2015 Elsevier Interactive Patient Education  2018 [employer]vier Inc.  

## 2018-08-14 NOTE — Assessment & Plan Note (Addendum)

## 2018-08-14 NOTE — Assessment & Plan Note (Signed)
Well controlled on current regimen. Renal function stable, no changes today.  Lab Results  Component Value Date   CREATININE 1.02 08/13/2018   Lab Results  Component Value Date   NA 141 08/13/2018   K 4.0 08/13/2018   CL 107 08/13/2018   CO2 29 08/13/2018

## 2018-08-14 NOTE — Assessment & Plan Note (Addendum)
Managed with finasteride and tamsulosin.  He has been having periodic  intermittent episodes of nocturia 3-4 times per night.  patient given sterile container to collect his urine during next episode

## 2018-08-14 NOTE — Assessment & Plan Note (Addendum)
<   39% stenosis by July 2017 U/s. Continue asa, simvastatin and prn surveillance.

## 2018-08-14 NOTE — Assessment & Plan Note (Signed)
Diagnosed by sleep study. he is wearing his CPAP every night a minimum of 6 hours per night and notes improved daytime wakefulness and decreased fatigue  

## 2018-08-14 NOTE — Assessment & Plan Note (Signed)
With CAD, CVD  and PAD.  LDL is at goal on statin  And LFTS are normal.   Lab Results  Component Value Date   CHOL 117 02/08/2018   HDL 39.10 02/08/2018   LDLCALC 59 02/08/2018   LDLDIRECT CANCELED 11/02/2017   TRIG 93.0 02/08/2018   CHOLHDL 3 02/08/2018   Lab Results  Component Value Date   ALT 23 08/13/2018   AST 23 08/13/2018   ALKPHOS 57 08/13/2018   BILITOT 0.5 08/13/2018    

## 2018-08-20 ENCOUNTER — Telehealth: Payer: Self-pay

## 2018-08-20 NOTE — Telephone Encounter (Signed)
Copied from [place] [id number]. Topic: General - Other >> Aug 20, 2018  1:39 PM Carolyn Stare wrote:  Pt said Dr Derrel Nip gave him the name of her dentist and he forgot and would to have that dentist name again

## 2018-08-20 NOTE — Telephone Encounter (Signed)
mychart message sent

## 2018-08-22 ENCOUNTER — Other Ambulatory Visit (INDEPENDENT_AMBULATORY_CARE_PROVIDER_SITE_OTHER): Payer: Medicare Other

## 2018-08-22 DIAGNOSIS — R351 Nocturia: Secondary | ICD-10-CM | POA: Diagnosis not present

## 2018-08-22 LAB — URINALYSIS, ROUTINE W REFLEX MICROSCOPIC
Bilirubin Urine: NEGATIVE
Hgb urine dipstick: NEGATIVE
Ketones, ur: NEGATIVE
Leukocytes, UA: NEGATIVE
Nitrite: POSITIVE — AB
RBC / HPF: NONE SEEN (ref 0–?)
Specific Gravity, Urine: 1.01 (ref 1.000–1.030)
Total Protein, Urine: NEGATIVE
Urine Glucose: NEGATIVE
Urobilinogen, UA: 0.2 (ref 0.0–1.0)
pH: 7.5 (ref 5.0–8.0)

## 2018-08-23 LAB — URINE CULTURE
MICRO NUMBER:: 91126407
SPECIMEN QUALITY:: ADEQUATE

## 2018-08-26 ENCOUNTER — Other Ambulatory Visit: Payer: Self-pay

## 2018-08-26 NOTE — Patient Outreach (Signed)
[facility]) Care Management  08/26/2018  David Atkins Jul 23, 1929 [id number]   Medication Adherence call to David Atkins spoke with patient he ask if we can order Simvastatin 20 mg thru [facility] , patient will received it with on 10 days from mail order. David Atkins is showing past due under [place].   [place] Management Direct Dial [phone]  Fax [phone] [email or web address]

## 2018-09-06 DIAGNOSIS — G4733 Obstructive sleep apnea (adult) (pediatric): Secondary | ICD-10-CM | POA: Diagnosis not present

## 2018-09-06 DIAGNOSIS — H6122 Impacted cerumen, left ear: Secondary | ICD-10-CM | POA: Diagnosis not present

## 2018-09-06 DIAGNOSIS — H903 Sensorineural hearing loss, bilateral: Secondary | ICD-10-CM | POA: Diagnosis not present

## 2018-10-01 ENCOUNTER — Encounter (INDEPENDENT_AMBULATORY_CARE_PROVIDER_SITE_OTHER): Payer: Self-pay | Admitting: Vascular Surgery

## 2018-10-01 ENCOUNTER — Ambulatory Visit (INDEPENDENT_AMBULATORY_CARE_PROVIDER_SITE_OTHER): Payer: Medicare Other

## 2018-10-01 ENCOUNTER — Ambulatory Visit (INDEPENDENT_AMBULATORY_CARE_PROVIDER_SITE_OTHER): Payer: Medicare Other | Admitting: Vascular Surgery

## 2018-10-01 ENCOUNTER — Other Ambulatory Visit: Payer: Self-pay

## 2018-10-01 VITALS — BP 145/82 | HR 61 | Resp 16 | Ht 70.0 in | Wt 200.0 lb

## 2018-10-01 DIAGNOSIS — I10 Essential (primary) hypertension: Secondary | ICD-10-CM | POA: Diagnosis not present

## 2018-10-01 DIAGNOSIS — R600 Localized edema: Secondary | ICD-10-CM | POA: Diagnosis not present

## 2018-10-01 DIAGNOSIS — G8929 Other chronic pain: Secondary | ICD-10-CM

## 2018-10-01 DIAGNOSIS — M79604 Pain in right leg: Secondary | ICD-10-CM

## 2018-10-01 DIAGNOSIS — E785 Hyperlipidemia, unspecified: Secondary | ICD-10-CM | POA: Diagnosis not present

## 2018-10-01 DIAGNOSIS — R42 Dizziness and giddiness: Secondary | ICD-10-CM | POA: Insufficient documentation

## 2018-10-01 DIAGNOSIS — I739 Peripheral vascular disease, unspecified: Secondary | ICD-10-CM | POA: Insufficient documentation

## 2018-10-01 DIAGNOSIS — Z87891 Personal history of nicotine dependence: Secondary | ICD-10-CM

## 2018-10-01 NOTE — Progress Notes (Signed)
MRN : [id number]  David Atkins is a 84 y.o. (1929-10-17) male who presents with chief complaint of  Chief Complaint  Patient presents with  . Follow-up    ultrasound  .  History of Present Illness: Patient returns today in follow up of PAD.  He is still relatively active and his legs bother him some with activity but not a severe amount.  His ABIs today as well as his digits are noncompressible but he has good waveforms which are triphasic down to the feet bilaterally with good digital waveforms.  No significant arterial insufficiency although PAD is present. His biggest complaint recently was of a jump in his blood pressure with some dizziness and headache.  His blood pressure went to about 671 systolic Friday night.  With extra blood pressure medicines this came down.  The dizziness has also gotten better.  No clear arm or leg weakness or numbness.  No speech or swallowing difficulty.  Current Outpatient Medications  Medication Sig Dispense Refill  . acetaminophen (TYLENOL) 325 MG tablet Take 650 mg by mouth 2 (two) times daily as needed.    Marland Kitchen amLODipine (NORVASC) 2.5 MG tablet Take 1 tablet (2.5 mg total) by mouth daily. 90 tablet 3  . aspirin 81 MG chewable tablet Chew 81 mg by mouth daily.    . Blood Pressure Monitoring (BLOOD PRESSURE MONITOR AUTOMAT) DEVI Use daily to check blood pressure 1 Device 0  . Calcium Carbonate-Vitamin D (CALCIUM + D PO) Take 1 tablet by mouth daily.     . Cholecalciferol (VITAMIN D-3) 1000 UNITS CAPS Take 1 capsule by mouth daily.    . diclofenac sodium (VOLTAREN) 1 % GEL Apply 4 g topically 4 (four) times daily. 1 Tube 3  . finasteride (PROSCAR) 5 MG tablet TAKE 1 TABLET BY MOUTH  DAILY 90 tablet 1  . fluocinonide cream (LIDEX) 2.45 % Apply 1 application topically 2 (two) times daily.    Marland Kitchen lisinopril (PRINIVIL,ZESTRIL) 20 MG tablet TAKE 1 TABLET BY MOUTH  DAILY 90 tablet 1  . meclizine (ANTIVERT) 12.5 MG tablet TAKE 1 TABLET (12.5 MG TOTAL) BY MOUTH 3  (THREE) TIMES DAILY AS NEEDED. 30 tablet 1  . metoprolol tartrate (LOPRESSOR) 25 MG tablet TAKE ONE-HALF TABLET BY  MOUTH TWO TIMES DAILY 90 tablet 1  . omeprazole (PRILOSEC) 20 MG capsule TAKE 1 CAPSULE (20 MG TOTAL) BY MOUTH DAILY. 90 capsule 1  . oxybutynin (DITROPAN) 5 MG tablet Take 1 tablet (5 mg total) by mouth 2 (two) times daily. 60 tablet 11  . polyethylene glycol powder (GLYCOLAX/MIRALAX) powder Take 17 g by mouth once. Dissolve 1 capful of power into any liquid and drink once daily (Patient taking differently: Take 17 g by mouth 2 (two) times daily. Dissolve 1 capful of power into any liquid and drink once daily) 500 g 0  . rOPINIRole (REQUIP) 0.25 MG tablet TAKE 1 TABLET (0.25 MG TOTAL) BY MOUTH AT BEDTIME. INCREASE WEEKLY IF NEEDED 90 tablet 1  . simvastatin (ZOCOR) 20 MG tablet TAKE 1 TABLET BY MOUTH AT  BEDTIME 90 tablet 1  . tamsulosin (FLOMAX) 0.4 MG CAPS capsule TAKE ONE CAPSULE BY MOUTH EVERY DAY AFTER BREAKFAST 90 capsule 2   No current facility-administered medications for this visit.     Past Medical History:  Diagnosis Date  . 3-vessel coronary artery disease 2006   [place]  . basal cell   . BPH (benign prostatic hyperplasia)   . GERD (gastroesophageal reflux disease)   .  Hyperlipidemia   . Hypertension   . Lumbar stenosis 12/03/13   MRI - lumbar pain with radiation down to LE  . Myocardial infarction ([place]) 2006  . OAB (overactive bladder)   . Osteoporosis 2010   [facility] at [facility]  . Sleep apnea   . Spondylolisthesis of lumbar region 11/23/13   MRI Lumbar spine    Past Surgical History:  Procedure Laterality Date  . APPENDECTOMY  1951  . COLONOSCOPY    . COLONOSCOPY WITH PROPOFOL N/A 11/15/2015   Procedure: COLONOSCOPY WITH PROPOFOL;  Surgeon: Hulen Luster, MD;  Location: [facility] ENDOSCOPY;  Service: Gastroenterology;  Laterality: N/A;  . CORONARY ARTERY BYPASS GRAFT  2006   3 vessel, s/p AMI  . NASAL SEPTOPLASTY W/ TURBINOPLASTY  1981  . TONSILLECTOMY       Social History Social History   Tobacco Use  . Smoking status: Former Smoker    Types: Cigarettes    Last attempt to quit: 07/10/1963    Years since quitting: 55.2  . Smokeless tobacco: Never Used  Substance Use Topics  . Alcohol use: No  . Drug use: No    Family History Family History  Problem Relation Age of Onset  . Hyperlipidemia Mother   . Hypertension Mother   . Hypertension Father    No bleeding or clotting disorders  Allergies  Allergen Reactions  . Sulfacetamide Sodium   . Sulfa Antibiotics Rash     REVIEW OF SYSTEMS (Negative unless checked)  Constitutional: [] Weight loss  [] Fever  [] Chills Cardiac: [] Chest pain   [] Chest pressure   [] Palpitations   [] Shortness of breath when laying flat   [] Shortness of breath at rest   [] Shortness of breath with exertion. Vascular:  [x] Pain in legs with walking   [] Pain in legs at rest   [] Pain in legs when laying flat   [] Claudication   [] Pain in feet when walking  [] Pain in feet at rest  [] Pain in feet when laying flat   [] History of DVT   [] Phlebitis   [] Swelling in legs   [] Varicose veins   [] Non-healing ulcers Pulmonary:   [] Uses home oxygen   [] Productive cough   [] Hemoptysis   [] Wheeze  [] COPD   [] Asthma Neurologic:  [x] Dizziness  [] Blackouts   [] Seizures   [] History of stroke   [] History of TIA  [] Aphasia   [] Temporary blindness   [] Dysphagia   [] Weakness or numbness in arms   [] Weakness or numbness in legs Musculoskeletal:  [x] Arthritis   [] Joint swelling   [] Joint pain   [] Low back pain Hematologic:  [] Easy bruising  [] Easy bleeding   [] Hypercoagulable state   [] Anemic   Gastrointestinal:  [] Blood in stool   [] Vomiting blood  [] Gastroesophageal reflux/heartburn   [] Abdominal pain Genitourinary:  [] Chronic kidney disease   [] Difficult urination  [] Frequent urination  [] Burning with urination   [] Hematuria Skin:  [] Rashes   [] Ulcers   [] Wounds Psychological:  [] History of anxiety   []  History of major  depression.  Physical Examination  BP (!) 145/82   Pulse 61   Resp 16   Ht 5\' 10"  (1.778 m)   Wt 200 lb (90.7 kg)   BMI 28.70 kg/m  Gen:  WD/WN, NAD.  Appears younger than stated age Head: [place]/AT, No temporalis wasting. Ear/Nose/Throat: Hearing grossly intact, nares w/o erythema or drainage Eyes: Conjunctiva clear. Sclera non-icteric Neck: Supple.  Trachea midline.  No carotid bruits heard. Pulmonary:  Good air movement, no use of accessory muscles.  Cardiac: RRR, no  JVD Vascular:  Vessel Right Left  Radial Palpable Palpable                          PT Palpable Palpable  DP Palpable Palpable   Gastrointestinal: soft, non-tender/non-distended.  Musculoskeletal: M/S 5/5 throughout.  No deformity or atrophy.  Trace lower extremity edema. Neurologic: Sensation grossly intact in extremities.  Symmetrical.  Speech is fluent.  Psychiatric: Judgment intact, Mood & affect appropriate for pt's clinical situation. Dermatologic: No rashes or ulcers noted.  No cellulitis or open wounds.       Labs Recent Results (from the past 2160 hour(s))  Comprehensive metabolic panel     Status: Abnormal   Collection Time: 08/13/18  2:23 PM  Result Value Ref Range   Sodium 141 135 - 145 mEq/L   Potassium 4.0 3.5 - 5.1 mEq/L   Chloride 107 96 - 112 mEq/L   CO2 29 19 - 32 mEq/L   Glucose, Bld 121 (H) 70 - 99 mg/dL   BUN 16 6 - 23 mg/dL   Creatinine, Ser 1.02 0.40 - 1.50 mg/dL   Total Bilirubin 0.5 0.2 - 1.2 mg/dL   Alkaline Phosphatase 57 39 - 117 U/L   AST 23 0 - 37 U/L   ALT 23 0 - 53 U/L   Total Protein 6.7 6.0 - 8.3 g/dL   Albumin 3.9 3.5 - 5.2 g/dL   Calcium 9.0 8.4 - 10.5 mg/dL   GFR 73.38 >60.00 mL/min  Urine Culture     Status: None   Collection Time: 08/22/18  2:50 PM  Result Value Ref Range   MICRO NUMBER: [id number]    SPECIMEN QUALITY: ADEQUATE    Sample Source URINE    STATUS: FINAL    ISOLATE 1:      Three or more organisms present, each greater than 10,000 cu/mL.  May represent normal flora contamination from external genitalia. No further testing is required.  Urinalysis, Routine w reflex microscopic     Status: Abnormal   Collection Time: 08/22/18  2:50 PM  Result Value Ref Range   Color, Urine YELLOW Yellow;Lt. Yellow   APPearance CLEAR Clear   Specific Gravity, Urine 1.010 1.000 - 1.030   pH 7.5 5.0 - 8.0   Total Protein, Urine NEGATIVE Negative   Urine Glucose NEGATIVE Negative   Ketones, ur NEGATIVE Negative   Bilirubin Urine NEGATIVE Negative   Hgb urine dipstick NEGATIVE Negative   Urobilinogen, UA 0.2 0.0 - 1.0   Leukocytes, UA NEGATIVE Negative   Nitrite POSITIVE (A) Negative   WBC, UA 0-2/hpf 0-2/hpf   RBC / HPF none seen 0-2/hpf   Bacteria, UA Many(>50/hpf) (A) None    Radiology No results found.  Assessment/Plan  Hyperlipidemia LDL goal <70 lipid control important in reducing the progression of atherosclerotic disease. Continue statin therapy   Edema of right lower extremity Well controlled with stockings  Hypertension blood pressure control important in reducing the progression of atherosclerotic disease. On appropriate oral medications.   Dizziness and giddiness The patient is having some dizziness and markedly elevated blood pressure.  It has been sometime since he has had his carotid arteries checked, I think that would be a good idea to check that in the near future.  The pathophysiology and natural history of carotid disease were discussed with the patient.  He is agreeable with our plan of care.  PAD (peripheral artery disease) (HCC) His ABIs today as well as his digits are  noncompressible but he has good waveforms which are triphasic down to the feet bilaterally with good digital waveforms.  No significant arterial insufficiency although PAD is present. No intervention required at this point.  Continue current medical regimen including aspirin and a statin agent.  Recheck in 1 year    Leotis Pain,  MD  10/01/2018 2:43 PM    This note was created with Dragon medical transcription system.  Any errors from dictation are purely unintentional

## 2018-10-01 NOTE — Assessment & Plan Note (Signed)
lipid control important in reducing the progression of atherosclerotic disease. Continue statin therapy  

## 2018-10-01 NOTE — Assessment & Plan Note (Signed)
The patient is having some dizziness and markedly elevated blood pressure.  It has been sometime since he has had his carotid arteries checked, I think that would be a good idea to check that in the near future.  The pathophysiology and natural history of carotid disease were discussed with the patient.  He is agreeable with our plan of care.

## 2018-10-01 NOTE — Assessment & Plan Note (Signed)
blood pressure control important in reducing the progression of atherosclerotic disease. On appropriate oral medications.  

## 2018-10-01 NOTE — Patient Instructions (Signed)

## 2018-10-01 NOTE — Assessment & Plan Note (Signed)
His ABIs today as well as his digits are noncompressible but he has good waveforms which are triphasic down to the feet bilaterally with good digital waveforms.  No significant arterial insufficiency although PAD is present. No intervention required at this point.  Continue current medical regimen including aspirin and a statin agent.  Recheck in 1 year

## 2018-10-01 NOTE — Assessment & Plan Note (Signed)
Well controlled with stockings

## 2018-10-24 ENCOUNTER — Ambulatory Visit: Payer: Medicare Other

## 2018-11-05 ENCOUNTER — Ambulatory Visit (INDEPENDENT_AMBULATORY_CARE_PROVIDER_SITE_OTHER): Payer: Medicare Other | Admitting: Vascular Surgery

## 2018-11-05 ENCOUNTER — Encounter (INDEPENDENT_AMBULATORY_CARE_PROVIDER_SITE_OTHER): Payer: Self-pay | Admitting: Vascular Surgery

## 2018-11-05 ENCOUNTER — Ambulatory Visit (INDEPENDENT_AMBULATORY_CARE_PROVIDER_SITE_OTHER): Payer: Medicare Other

## 2018-11-05 VITALS — BP 147/82 | HR 67 | Resp 18 | Ht 69.5 in | Wt 206.0 lb

## 2018-11-05 DIAGNOSIS — I739 Peripheral vascular disease, unspecified: Secondary | ICD-10-CM | POA: Diagnosis not present

## 2018-11-05 DIAGNOSIS — I6523 Occlusion and stenosis of bilateral carotid arteries: Secondary | ICD-10-CM

## 2018-11-05 DIAGNOSIS — R600 Localized edema: Secondary | ICD-10-CM | POA: Diagnosis not present

## 2018-11-05 DIAGNOSIS — E785 Hyperlipidemia, unspecified: Secondary | ICD-10-CM

## 2018-11-05 DIAGNOSIS — R42 Dizziness and giddiness: Secondary | ICD-10-CM | POA: Diagnosis not present

## 2018-11-05 DIAGNOSIS — I779 Disorder of arteries and arterioles, unspecified: Secondary | ICD-10-CM | POA: Diagnosis not present

## 2018-11-05 DIAGNOSIS — I10 Essential (primary) hypertension: Secondary | ICD-10-CM | POA: Diagnosis not present

## 2018-11-05 NOTE — Patient Instructions (Signed)
Carotid Artery Disease The carotid arteries are arteries on both sides of the neck. They carry blood to the brain. Carotid artery disease is when the arteries get smaller (narrow) or get blocked. If these arteries get smaller or get blocked, you are more likely to have a stroke or warning stroke (transient ischemic attack). Follow these instructions at home:  Take medicines as told by your doctor. Make sure you understand all your medicine instructions. Do not stop your medicines without talking to your doctor first.  Follow your doctor's diet instructions. It is important to eat a healthy diet that includes plenty of: ? Fresh fruits. ? Vegetables. ? Lean meats.  Avoid: ? High-fat foods. ? High-sodium foods. ? Foods that are fried, overly processed, or have poor nutritional value.  Stay a healthy weight.  Stay active. Get at least 30 minutes of activity every day.  Do not smoke.  Limit alcohol use to: ? No more than 2 drinks a day for men. ? No more than 1 drink a day for women who are not pregnant.  Do not use illegal drugs.  Keep all doctor visits as told. Get help right away if:  You have sudden weakness or loss of feeling (numbness) on one side of the body, such as the face, arm, or leg.  You have sudden confusion.  You have trouble speaking (aphasia) or understanding.  You have sudden trouble seeing out of one or both eyes.  You have sudden trouble walking.  You have dizziness or feel like you might pass out (faint).  You have a loss of balance or your movements are not steady (uncoordinated).  You have a sudden, severe headache with no known cause.  You have trouble swallowing (dysphagia). Call your local emergency services (911 in [place] Do notdrive yourself to the clinic or hospital. This information is not intended to replace advice given to you by your health care provider. Make sure you discuss any questions you have with your health care  provider. Document Released: 11/06/2012 Document Revised: 04/27/2016 Document Reviewed: 05/21/2013 Elsevier Interactive Patient Education  2018 Elsevier Inc.  

## 2018-11-05 NOTE — Progress Notes (Signed)
MRN : [id number]  David Atkins is a 84 y.o. (1929/09/11) male who presents with chief complaint of  Chief Complaint  Patient presents with  . Follow-up    Carotid u/s f/u  .  History of Present Illness: Patient returns today in follow up of his carotid disease.  He is doing well without specific complaints.  He denies any focal neurologic symptoms. Specifically, the patient denies amaurosis fugax, speech or swallowing difficulties, or arm or leg weakness or numbness.  His carotid duplex today shows significant progression from his study in 2017 now with velocities in the 40 to 59% range on the right and in the 60 to 79% range on the left.  The study in 2017 was read out as mild carotid artery stenosis bilaterally.  Current Outpatient Medications  Medication Sig Dispense Refill  . acetaminophen (TYLENOL) 325 MG tablet Take 650 mg by mouth 2 (two) times daily as needed.    Marland Kitchen amLODipine (NORVASC) 2.5 MG tablet Take 1 tablet (2.5 mg total) by mouth daily. 90 tablet 3  . aspirin 81 MG chewable tablet Chew 81 mg by mouth daily.    . Blood Pressure Monitoring (BLOOD PRESSURE MONITOR AUTOMAT) DEVI Use daily to check blood pressure 1 Device 0  . Calcium Carbonate-Vitamin D (CALCIUM + D PO) Take 1 tablet by mouth daily.     . Cholecalciferol (VITAMIN D-3) 1000 UNITS CAPS Take 1 capsule by mouth daily.    . diclofenac sodium (VOLTAREN) 1 % GEL Apply 4 g topically 4 (four) times daily. 1 Tube 3  . finasteride (PROSCAR) 5 MG tablet TAKE 1 TABLET BY MOUTH  DAILY 90 tablet 1  . fluocinonide cream (LIDEX) 2.35 % Apply 1 application topically 2 (two) times daily.    Marland Kitchen lisinopril (PRINIVIL,ZESTRIL) 20 MG tablet TAKE 1 TABLET BY MOUTH  DAILY 90 tablet 1  . meclizine (ANTIVERT) 12.5 MG tablet TAKE 1 TABLET (12.5 MG TOTAL) BY MOUTH 3 (THREE) TIMES DAILY AS NEEDED. 30 tablet 1  . metoprolol tartrate (LOPRESSOR) 25 MG tablet TAKE ONE-HALF TABLET BY  MOUTH TWO TIMES DAILY 90 tablet 1  . omeprazole (PRILOSEC)  20 MG capsule TAKE 1 CAPSULE (20 MG TOTAL) BY MOUTH DAILY. 90 capsule 1  . oxybutynin (DITROPAN) 5 MG tablet Take 1 tablet (5 mg total) by mouth 2 (two) times daily. 60 tablet 11  . polyethylene glycol powder (GLYCOLAX/MIRALAX) powder Take 17 g by mouth once. Dissolve 1 capful of power into any liquid and drink once daily (Patient taking differently: Take 17 g by mouth 2 (two) times daily. Dissolve 1 capful of power into any liquid and drink once daily) 500 g 0  . rOPINIRole (REQUIP) 0.25 MG tablet TAKE 1 TABLET (0.25 MG TOTAL) BY MOUTH AT BEDTIME. INCREASE WEEKLY IF NEEDED 90 tablet 1  . simvastatin (ZOCOR) 20 MG tablet TAKE 1 TABLET BY MOUTH AT  BEDTIME 90 tablet 1  . tamsulosin (FLOMAX) 0.4 MG CAPS capsule TAKE ONE CAPSULE BY MOUTH EVERY DAY AFTER BREAKFAST 90 capsule 2   No current facility-administered medications for this visit.     Past Medical History:  Diagnosis Date  . 3-vessel coronary artery disease 2006   [place]  . basal cell   . BPH (benign prostatic hyperplasia)   . GERD (gastroesophageal reflux disease)   . Hyperlipidemia   . Hypertension   . Lumbar stenosis 12/03/13   MRI - lumbar pain with radiation down to LE  . Myocardial infarction ([place]) 2006  .  OAB (overactive bladder)   . Osteoporosis 2010   by DEXA at [facility]  . Sleep apnea   . Spondylolisthesis of lumbar region 11/23/13   MRI Lumbar spine    Past Surgical History:  Procedure Laterality Date  . APPENDECTOMY  1951  . COLONOSCOPY    . COLONOSCOPY WITH PROPOFOL N/A 11/15/2015   Procedure: COLONOSCOPY WITH PROPOFOL;  Surgeon: Hulen Luster, MD;  Location: [facility] ENDOSCOPY;  Service: Gastroenterology;  Laterality: N/A;  . CORONARY ARTERY BYPASS GRAFT  2006   3 vessel, s/p AMI  . NASAL SEPTOPLASTY W/ TURBINOPLASTY  1981  . TONSILLECTOMY     Social History        Tobacco Use  . Smoking status: Former Smoker    Types: Cigarettes    Last attempt to quit: 07/10/1963    Years since quitting: 55.2  . Smokeless  tobacco: Never Used  Substance Use Topics  . Alcohol use: No  . Drug use: No    Family History      Family History  Problem Relation Age of Onset  . Hyperlipidemia Mother   . Hypertension Mother   . Hypertension Father    No bleeding or clotting disorders      Allergies  Allergen Reactions  . Sulfacetamide Sodium   . Sulfa Antibiotics Rash     REVIEW OF SYSTEMS (Negative unless checked)  Constitutional: [] Weight loss  [] Fever  [] Chills Cardiac: [] Chest pain   [] Chest pressure   [] Palpitations   [] Shortness of breath when laying flat   [] Shortness of breath at rest   [] Shortness of breath with exertion. Vascular:  [x] Pain in legs with walking   [] Pain in legs at rest   [] Pain in legs when laying flat   [] Claudication   [] Pain in feet when walking  [] Pain in feet at rest  [] Pain in feet when laying flat   [] History of DVT   [] Phlebitis   [] Swelling in legs   [] Varicose veins   [] Non-healing ulcers Pulmonary:   [] Uses home oxygen   [] Productive cough   [] Hemoptysis   [] Wheeze  [] COPD   [] Asthma Neurologic:  [x] Dizziness  [] Blackouts   [] Seizures   [] History of stroke   [] History of TIA  [] Aphasia   [] Temporary blindness   [] Dysphagia   [] Weakness or numbness in arms   [] Weakness or numbness in legs Musculoskeletal:  [x] Arthritis   [] Joint swelling   [] Joint pain   [] Low back pain Hematologic:  [] Easy bruising  [] Easy bleeding   [] Hypercoagulable state   [] Anemic   Gastrointestinal:  [] Blood in stool   [] Vomiting blood  [] Gastroesophageal reflux/heartburn   [] Abdominal pain Genitourinary:  [] Chronic kidney disease   [] Difficult urination  [] Frequent urination  [] Burning with urination   [] Hematuria Skin:  [] Rashes   [] Ulcers   [] Wounds Psychological:  [] History of anxiety   []  History of major depression.    Physical Examination  BP (!) 147/82 (BP Location: Right Arm, Patient Position: Sitting)   Pulse 67   Resp 18   Ht 5' 9.5" (1.765 m)   Wt 206 lb (93.4 kg)    BMI 29.98 kg/m  Gen:  WD/WN, NAD Head: [place]/AT, No temporalis wasting. Ear/Nose/Throat: Hearing grossly intact, nares w/o erythema or drainage Eyes: Conjunctiva clear. Sclera non-icteric Neck: Supple.  Trachea midline Pulmonary:  Good air movement, no use of accessory muscles.  Cardiac: RRR, no JVD Vascular:  Vessel Right Left  Radial Palpable Palpable  Gastrointestinal: soft, non-tender/non-distended. No guarding/reflex.  Musculoskeletal: M/S 5/5 throughout.  No deformity or atrophy. No edema. Neurologic: Sensation grossly intact in extremities.  Symmetrical.  Speech is fluent.  Psychiatric: Judgment intact, Mood & affect appropriate for pt's clinical situation. Dermatologic: No rashes or ulcers noted.  No cellulitis or open wounds.       Labs Recent Results (from the past 2160 hour(s))  Comprehensive metabolic panel     Status: Abnormal   Collection Time: 08/13/18  2:23 PM  Result Value Ref Range   Sodium 141 135 - 145 mEq/L   Potassium 4.0 3.5 - 5.1 mEq/L   Chloride 107 96 - 112 mEq/L   CO2 29 19 - 32 mEq/L   Glucose, Bld 121 (H) 70 - 99 mg/dL   BUN 16 6 - 23 mg/dL   Creatinine, Ser 1.02 0.40 - 1.50 mg/dL   Total Bilirubin 0.5 0.2 - 1.2 mg/dL   Alkaline Phosphatase 57 39 - 117 U/L   AST 23 0 - 37 U/L   ALT 23 0 - 53 U/L   Total Protein 6.7 6.0 - 8.3 g/dL   Albumin 3.9 3.5 - 5.2 g/dL   Calcium 9.0 8.4 - 10.5 mg/dL   GFR 73.38 >60.00 mL/min  Urine Culture     Status: None   Collection Time: 08/22/18  2:50 PM  Result Value Ref Range   MICRO NUMBER: [id number]    SPECIMEN QUALITY: ADEQUATE    Sample Source URINE    STATUS: FINAL    ISOLATE 1:      Three or more organisms present, each greater than 10,000 cu/mL. May represent normal flora contamination from external genitalia. No further testing is required.  Urinalysis, Routine w reflex microscopic     Status: Abnormal   Collection Time: 08/22/18  2:50 PM  Result Value Ref  Range   Color, Urine YELLOW Yellow;Lt. Yellow   APPearance CLEAR Clear   Specific Gravity, Urine 1.010 1.000 - 1.030   pH 7.5 5.0 - 8.0   Total Protein, Urine NEGATIVE Negative   Urine Glucose NEGATIVE Negative   Ketones, ur NEGATIVE Negative   Bilirubin Urine NEGATIVE Negative   Hgb urine dipstick NEGATIVE Negative   Urobilinogen, UA 0.2 0.0 - 1.0   Leukocytes, UA NEGATIVE Negative   Nitrite POSITIVE (A) Negative   WBC, UA 0-2/hpf 0-2/hpf   RBC / HPF none seen 0-2/hpf   Bacteria, UA Many(>50/hpf) (A) None    Radiology No results found.  Assessment/Plan Hyperlipidemia LDL goal <70 lipid control important in reducing the progression of atherosclerotic disease. Continue statin therapy   Edema of right lower extremity Well controlled with stockings  Hypertension blood pressure control important in reducing the progression of atherosclerotic disease. On appropriate oral medications.  PAD (peripheral artery disease) ([place]) His ABIs recently as well as his digits are noncompressible but he has good waveforms which are triphasic down to the feet bilaterally with good digital waveforms.  No significant arterial insufficiency although PAD is present. No intervention required at this point.  Continue current medical regimen including aspirin and a statin agent.  Recheck in 1 year  Bilateral carotid artery stenosis His carotid duplex today shows significant progression from his study in 2017 now with velocities in the 40 to 59% range on the right and in the 60 to 79% range on the left.  The study in 2017 was read out as mild carotid artery stenosis bilaterally.  He had a reported CT scan for several years before  that showing at least moderate stenosis on the left so I suspect his ultrasound in 2017 was erroneous.  In any event, his disease appears to still be in a range that would be best treated with medical therapy.  He is on appropriate therapy with aspirin and a statin agent.  We can  follow this up in 6 to 12 months with an ultrasound and he is scheduled to come back sometime next year to have his PAD rechecked and we can do a carotid ultrasound at that time.    Leotis Pain, MD  11/05/2018 4:41 PM    This note was created with Dragon medical transcription system.  Any errors from dictation are purely unintentional

## 2018-11-05 NOTE — Assessment & Plan Note (Signed)
His carotid duplex today shows significant progression from his study in 2017 now with velocities in the 40 to 59% range on the right and in the 60 to 79% range on the left.  The study in 2017 was read out as mild carotid artery stenosis bilaterally.  He had a reported CT scan for several years before that showing at least moderate stenosis on the left so I suspect his ultrasound in 2017 was erroneous.  In any event, his disease appears to still be in a range that would be best treated with medical therapy.  He is on appropriate therapy with aspirin and a statin agent.  We can follow this up in 6 to 12 months with an ultrasound and he is scheduled to come back sometime next year to have his PAD rechecked and we can do a carotid ultrasound at that time.

## 2018-11-07 ENCOUNTER — Ambulatory Visit: Payer: Medicare Other

## 2018-11-08 ENCOUNTER — Other Ambulatory Visit: Payer: Self-pay

## 2018-11-08 MED ORDER — AMLODIPINE BESYLATE 2.5 MG PO TABS: 2.5000 mg | ORAL_TABLET | Freq: Every day | ORAL | 1 refills | Status: DC

## 2018-11-11 ENCOUNTER — Ambulatory Visit: Payer: Medicare Other

## 2018-11-11 VITALS — BP 138/74 | HR 60 | Temp 97.7°F | Resp 15 | Ht 69.5 in | Wt 205.0 lb

## 2018-11-11 DIAGNOSIS — Z0000 Encounter for general adult medical examination without abnormal findings: Secondary | ICD-10-CM

## 2018-11-11 NOTE — Progress Notes (Addendum)
Subjective:   David Atkins is a 84 y.o. male who presents for Medicare Annual/Subsequent preventive examination.  Review of Systems:  No ROS.  Medicare Wellness Visit. Additional risk factors are reflected in the social history. Cardiac Risk Factors include: advanced age (>34men, >45 women);male gender;hypertension     Objective:    Vitals: BP 138/74 (BP Location: Right Arm, Patient Position: Sitting, Cuff Size: Normal)   Pulse 60   Temp 97.7 F (36.5 C) (David)   Resp 15   Ht 5' 9.5" (1.765 m)   Wt 205 lb (93 kg)   SpO2 96%   BMI 29.84 kg/m   Body mass index is 29.84 kg/m.  Advanced Directives 11/11/2018 11/06/2017 08/31/2017 11/06/2016 12/16/2015 10/31/2015  Does Patient Have a Medical Advance Directive? Yes Yes Yes Yes Yes Yes  Type of Paramedic of [place];Living will Living will;Healthcare Power of [place];Living will [place];Living will Living will [place];Living will  Does patient want to make changes to medical advance directive? No - Patient declined No - Patient declined - - No - Patient declined No - Patient declined  Copy of [place] in Chart? Yes - validated most recent copy scanned in chart (See row information) No - copy requested - No - copy requested No - copy requested No - copy requested    Tobacco Social History   Tobacco Use  Smoking Status Former Smoker  . Types: Cigarettes  . Last attempt to quit: 07/10/1963  . Years since quitting: 55.3  Smokeless Tobacco Never Used     Counseling given: Not Answered   Clinical Intake:  Pre-visit preparation completed: Yes  Pain : No/denies pain     Nutritional Status: BMI 25 -29 Overweight Diabetes: No  How often do you need to have someone help you when you read instructions, pamphlets, or other written materials from your doctor or pharmacy?: 1 - Never        Past Medical History:    Diagnosis Date  . 3-vessel coronary artery disease 2006   [place]  . basal cell   . BPH (benign prostatic hyperplasia)   . GERD (gastroesophageal reflux disease)   . Hyperlipidemia   . Hypertension   . Lumbar stenosis 12/03/13   MRI - lumbar pain with radiation down to LE  . Myocardial infarction ([place]) 2006  . OAB (overactive bladder)   . Osteoporosis 2010   by DEXA at [facility] [place]  . Sleep apnea   . Spondylolisthesis of lumbar region 11/23/13   MRI Lumbar spine   Past Surgical History:  Procedure Laterality Date  . APPENDECTOMY  1951  . COLONOSCOPY    . COLONOSCOPY WITH PROPOFOL N/A 11/15/2015   Procedure: COLONOSCOPY WITH PROPOFOL;  Surgeon: Hulen Luster, MD;  Location[facility] END[facility]COPY;  Service: Gastroenterology;  Laterality: N/A;  . CORONARY ARTERY BYPASS GRAFT  2006   3 vessel, s/p AMI  . NASAL SEPTOPLASTY W/ TURBINOPLASTY  1981  . TONSILLECTOMY     Family History  Problem Relation Age of Onset  . Hyperlipidemia Mother   . Hypertension Mother   . Hypertension Father    Social History   Socioeconomic History  . Marital status: Married    Spouse name: Not on file  . Number of children: Not on file  . Years of education: Not on file  . Highest education level: Not on file  Occupational History  . Not on file  Social Needs  .  Financial resource strain: Not hard at all  . Food insecurity:    Worry: Never true    Inability: Never true  . Transportation needs:    Medical: No    Non-medical: No  Tobacco Use  . Smoking status: Former Smoker    Types: Cigarettes    Last attempt to quit: 07/10/1963    Years since quitting: 55.3  . Smokeless tobacco: Never Used  Substance and Sexual Activity  . Alcohol use: No  . Drug use: No  . Sexual activity: Not Currently  Lifestyle  . Physical activity:    Days per week: Not on file    Minutes per session: Not on file  . Stress: Not on file  Relationships  . Social connections:    Talks on phone: Not on file    Gets together:  Not on file    Attends religious service: Not on file    Active member of club or organization: Not on file    Attends meetings of clubs or organizations: Not on file    Relationship status: Married  Other Topics Concern  . Not on file  Social History Narrative  . Not on file    Outpatient Encounter Medications as of 11/11/2018  Medication Sig  . acetaminophen (TYLENOL) 325 MG tablet Take 650 mg by mouth 2 (two) times daily as needed.  Marland Kitchen amLODipine (NORVASC) 2.5 MG tablet Take 1 tablet (2.5 mg total) by mouth daily.  Marland Kitchen aspirin 81 MG chewable tablet Chew 81 mg by mouth daily.  . Blood Pressure Monitoring (BLOOD PRESSURE MONITOR AUTOMAT) DEVI Use daily to check blood pressure  . Calcium Carbonate-Vitamin D (CALCIUM + D PO) Take 1 tablet by mouth daily.   . Cholecalciferol (VITAMIN D-3) 1000 UNITS CAPS Take 1 capsule by mouth daily.  . diclofenac sodium (VOLTAREN) 1 % GEL Apply 4 g topically 4 (four) times daily.  . finasteride (PROSCAR) 5 MG tablet TAKE 1 TABLET BY MOUTH  DAILY  . fluocinonide cream (LIDEX) 4.97 % Apply 1 application topically 2 (two) times daily.  Marland Kitchen lisinopril (PRINIVIL,ZESTRIL) 20 MG tablet TAKE 1 TABLET BY MOUTH  DAILY  . meclizine (ANTIVERT) 12.5 MG tablet TAKE 1 TABLET (12.5 MG TOTAL) BY MOUTH 3 (THREE) TIMES DAILY AS NEEDED.  . metoprolol tartrate (LOPRESSOR) 25 MG tablet TAKE ONE-HALF TABLET BY  MOUTH TWO TIMES DAILY  . omeprazole (PRILOSEC) 20 MG capsule TAKE 1 CAPSULE (20 MG TOTAL) BY MOUTH DAILY.  Marland Kitchen oxybutynin (DITROPAN) 5 MG tablet Take 1 tablet (5 mg total) by mouth 2 (two) times daily.  . polyethylene glycol powder (GLYCOLAX/MIRALAX) powder Take 17 g by mouth once. Dissolve 1 capful of power into any liquid and drink once daily (Patient taking differently: Take 17 g by mouth 2 (two) times daily. Dissolve 1 capful of power into any liquid and drink once daily)  . rOPINIRole (REQUIP) 0.25 MG tablet TAKE 1 TABLET (0.25 MG TOTAL) BY MOUTH AT BEDTIME. INCREASE WEEKLY  IF NEEDED  . simvastatin (ZOCOR) 20 MG tablet TAKE 1 TABLET BY MOUTH AT  BEDTIME  . tamsulosin (FLOMAX) 0.4 MG CAPS capsule TAKE ONE CAPSULE BY MOUTH EVERY DAY AFTER BREAKFAST   No facility-administered encounter medications on file as of 11/11/2018.     Activities of Daily Living In your present state of health, do you have any difficulty performing the following activities: 11/11/2018  Hearing? Y  Comment Hearing aid, bilateral  Vision? N  Difficulty concentrating or making decisions? N  Walking or  climbing stairs? Y  Comment Cane in use when ambulating.  Unsteady gait.   Dressing or bathing? N  Doing errands, shopping? N  Preparing Food and eating ? N  Using the Toilet? N  In the past six months, have you accidently leaked urine? Y  Comment Followed by Urologist and pcp  Do you have problems with loss of bowel control? N  Managing your Medications? N  Managing your Finances? Y  Comment Daughter assists as needed  Housekeeping or managing your Housekeeping? Y  Comment House keeping assists  Some recent data might be hidden    Patient Care Team: David Mc, MD as PCP - General (Internal Medicine)   Assessment:   This is a routine wellness examination for David Atkins. The goal of the wellness visit is to assist the patient how to close the gaps in care and create a preventative care plan for the patient.   The roster of all physicians providing medical care to patient is listed in the Snapshot section of the chart.  Taking calcium VIT D as appropriate/Osteoporosis  reviewed.    Safety issues reviewed; Smoke and carbon monoxide detectors in the home. No firearms in the home. Wears seatbelts when driving or riding with others. No violence in the home.  They do not have excessive sun exposure.  Discussed the need for sun protection: hats, long sleeves and the use of sunscreen if there is significant sun exposure.  Patient is alert, normal appearance, oriented to  person/place/and time.  Correctly identified the president of the [place] and recalls of 3/3 words. Performs simple calculations and can read correct time from watch face.  Displays appropriate judgement.  No new identified risk were noted.  No failures at ADL's or IADL's.  Ambulates with cane.   BMI- discussed the importance of a healthy diet, water intake and the benefits of aerobic exercise. Educational material provided.   24 hour diet recall: Low sodium diet  Hypertension- Monitors at home and reports daily average 130/75. Last 3: 147/82, 145/82, 130/68  Dental- dentures.   Sleep patterns- Sleeps well at night.   Health maintenance gaps- closed.  Exercise Activities and Dietary recommendations Current Exercise Habits: Home exercise routine, Time (Minutes): 10, Frequency (Times/Week): 7, Weekly Exercise (Minutes/Week): 70, Intensity: Mild  Goals      Patient Stated   . Increase physical activity (pt-stated)     Walk for exercise for 15 minutes daily       Fall Risk Fall Risk  11/11/2018 08/13/2018 11/06/2017 10/11/2017 08/23/2017  Falls in the past year? 0 No No No No  Number falls in past yr: - - - - -  Injury with Fall? - - - - -  Risk Factor Category  - - - - -  [place] for fall due to : - - - - -  Follow up - - - - -   Depression Screen PHQ 2/9 Scores 11/11/2018 11/06/2017 10/11/2017 08/23/2017  PHQ - 2 Score 0 0 0 0  PHQ- 9 Score - 0 - -    Cognitive Function     6CIT Screen 11/11/2018 11/06/2017 11/06/2016  What Year? 0 points 0 points 0 points  What month? 0 points 0 points 0 points  What time? 0 points 0 points 0 points  Count back from 20 0 points 0 points 0 points  Months in reverse 0 points 0 points 0 points  Repeat phrase 0 points 0 points 0  points  Total Score 0 0 0    Immunization History  Administered Date(s) Administered  . Influenza Split 11/11/2012, 09/25/2014  . Influenza, High Dose Seasonal PF 10/15/2013, 10/01/2015, 08/13/2018   . Influenza-Unspecified 10/01/2016, 08/31/2017  . Pneumococcal Conjugate-13 11/25/2014, 04/03/2015  . Pneumococcal Polysaccharide-23 11/25/2012  . Tdap 08/15/2017  . Zoster 04/10/2011   Screening Tests Health Maintenance  Topic Date Due  . TETANUS/TDAP  08/16/2027  . INFLUENZA VACCINE  Completed  . PNA vac Low Risk Adult  Completed      Plan:    End of life planning; Advance aging; Advanced directives discussed. Copy of current HCPOA/Living Will on file.    I have personally reviewed and noted the following in the patient's chart:   . Medical and social history . Use of alcohol, tobacco or illicit drugs  . Current medications and supplements . Functional ability and status . Nutritional status . Physical activity . Advanced directives . List of other physicians . Hospitalizations, surgeries, and ER visits in previous 12 months . Vitals . Screenings to include cognitive, depression, and falls . Referrals and appointments  In addition, I have reviewed and discussed with patient certain preventive protocols, quality metrics, and best practice recommendations. A written personalized care plan for preventive services as well as general preventive health recommendations were provided to patient.     David Atkins, David Capell Atkins, David Atkins  08/06/8181   I have reviewed the above information and agree with above.   Deborra Medina, MD

## 2018-11-11 NOTE — Patient Instructions (Addendum)
  [name] , Thank you for taking time to come for your Medicare Wellness Visit. I appreciate your ongoing commitment to your health goals. Please review the following plan we discussed and let me know if I can assist you in the future.   These are the goals we discussed: Goals      Patient Stated   . Increase physical activity (pt-stated)     Walk for exercise for 15 minutes daily       This is a list of the screening recommended for you and due dates:  Health Maintenance  Topic Date Due  . Tetanus Vaccine  08/16/2027  . Flu Shot  Completed  . Pneumonia vaccines  Completed

## 2018-12-15 ENCOUNTER — Other Ambulatory Visit: Payer: Self-pay

## 2018-12-15 ENCOUNTER — Observation Stay
Admission: EM | Admit: 2018-12-15 | Discharge: 2018-12-17 | Disposition: A | Discharge status: Home / Self Care | Payer: Medicare Other | Attending: Internal Medicine | Admitting: Internal Medicine

## 2018-12-15 ENCOUNTER — Emergency Department: Payer: Medicare Other

## 2018-12-15 DIAGNOSIS — I252 Old myocardial infarction: Secondary | ICD-10-CM | POA: Insufficient documentation

## 2018-12-15 DIAGNOSIS — Z79899 Other long term (current) drug therapy: Secondary | ICD-10-CM | POA: Insufficient documentation

## 2018-12-15 DIAGNOSIS — Z87891 Personal history of nicotine dependence: Secondary | ICD-10-CM | POA: Insufficient documentation

## 2018-12-15 DIAGNOSIS — E860 Dehydration: Secondary | ICD-10-CM | POA: Diagnosis not present

## 2018-12-15 DIAGNOSIS — Z951 Presence of aortocoronary bypass graft: Secondary | ICD-10-CM | POA: Insufficient documentation

## 2018-12-15 DIAGNOSIS — I2510 Atherosclerotic heart disease of native coronary artery without angina pectoris: Secondary | ICD-10-CM | POA: Diagnosis not present

## 2018-12-15 DIAGNOSIS — R569 Unspecified convulsions: Secondary | ICD-10-CM | POA: Diagnosis present

## 2018-12-15 DIAGNOSIS — M48061 Spinal stenosis, lumbar region without neurogenic claudication: Secondary | ICD-10-CM | POA: Diagnosis not present

## 2018-12-15 DIAGNOSIS — Z7982 Long term (current) use of aspirin: Secondary | ICD-10-CM | POA: Insufficient documentation

## 2018-12-15 DIAGNOSIS — E785 Hyperlipidemia, unspecified: Secondary | ICD-10-CM | POA: Insufficient documentation

## 2018-12-15 DIAGNOSIS — R55 Syncope and collapse: Secondary | ICD-10-CM | POA: Diagnosis not present

## 2018-12-15 DIAGNOSIS — I10 Essential (primary) hypertension: Secondary | ICD-10-CM | POA: Insufficient documentation

## 2018-12-15 DIAGNOSIS — N400 Enlarged prostate without lower urinary tract symptoms: Secondary | ICD-10-CM | POA: Insufficient documentation

## 2018-12-15 DIAGNOSIS — K8020 Calculus of gallbladder without cholecystitis without obstruction: Secondary | ICD-10-CM | POA: Diagnosis not present

## 2018-12-15 DIAGNOSIS — K5900 Constipation, unspecified: Secondary | ICD-10-CM | POA: Diagnosis present

## 2018-12-15 DIAGNOSIS — S0990XA Unspecified injury of head, initial encounter: Secondary | ICD-10-CM | POA: Diagnosis not present

## 2018-12-15 DIAGNOSIS — K219 Gastro-esophageal reflux disease without esophagitis: Secondary | ICD-10-CM | POA: Diagnosis not present

## 2018-12-15 LAB — CBC
HCT: 45.4 % (ref 39.0–52.0)
Hemoglobin: 15.2 g/dL (ref 13.0–17.0)
MCH: 32.5 pg (ref 26.0–34.0)
MCHC: 33.5 g/dL (ref 30.0–36.0)
MCV: 97.2 fL (ref 80.0–100.0)
Platelets: 207 10*3/uL (ref 150–400)
RBC: 4.67 MIL/uL (ref 4.22–5.81)
RDW: 12.7 % (ref 11.5–15.5)
WBC: 8.9 10*3/uL (ref 4.0–10.5)
nRBC: 0 % (ref 0.0–0.2)

## 2018-12-15 LAB — BASIC METABOLIC PANEL
Anion gap: 7 (ref 5–15)
BUN: 18 mg/dL (ref 8–23)
CO2: 26 mmol/L (ref 22–32)
Calcium: 8.9 mg/dL (ref 8.9–10.3)
Chloride: 106 mmol/L (ref 98–111)
Creatinine, Ser: 0.86 mg/dL (ref 0.61–1.24)
GFR calc Af Amer: >60 mL/min (ref >60)
GFR calc non Af Amer: >60 mL/min (ref >60)
Glucose, Bld: 166 mg/dL — ABNORMAL HIGH (ref 70–99)
Potassium: 3.6 mmol/L (ref 3.5–5.1)
Sodium: 139 mmol/L (ref 135–145)

## 2018-12-15 LAB — TROPONIN I: Troponin I: <0.03 ng/mL (ref <0.03)

## 2018-12-15 MED ORDER — IOHEXOL 300 MG/ML  SOLN
100.0000 mL | Freq: Once | INTRAMUSCULAR | Status: AC | PRN
  Administered 2018-12-15: 100 mL via INTRAVENOUS

## 2018-12-15 NOTE — ED Triage Notes (Signed)
Pt reports that he "passed out" when walking back into his house - pt reports hitting head and recall of event has gotten worse since the event - pt also hit left shoulder on a counter - denies headache - reports lightheadedness  - pt pale in color - pt has significant cardiac history

## 2018-12-15 NOTE — ED Provider Notes (Signed)
[facility] Emergency Department Provider Note  ____________________________________________  Time seen: Approximately 10:32 PM  I have reviewed the triage vital signs and the nursing notes.   HISTORY  Chief Complaint Loss of Consciousness    HPI David Atkins is a 85 y.o. male with a history of GERD, hypertension, vertigo who reports that he was walking into his house today when he got dizzy and passed out.  He did fall and hit his head and is having some memory difficulty related to the events of this morning afterward.  He otherwise reports that he feels fine, but in the waiting room here in the ED, he nearly passed out again and appeared very pale.  He denies any chest pain shortness of breath palpitations diaphoresis vomiting diarrhea or constipation.  No black or bloody stool.  Reports that he has been eating and drinking normally.  Family report that this morning he did not have much of an appetite which is very unusual for him.      Past Medical History:  Diagnosis Date  . 3-vessel coronary artery disease 2006   [place]  . basal cell   . BPH (benign prostatic hyperplasia)   . GERD (gastroesophageal reflux disease)   . Hyperlipidemia   . Hypertension   . Lumbar stenosis 12/03/13   MRI - lumbar pain with radiation down to LE  . Myocardial infarction ([place]) 2006  . OAB (overactive bladder)   . Osteoporosis 2010   by DEXA at [facility]  . Sleep apnea   . Spondylolisthesis of lumbar region 11/23/13   MRI Lumbar spine     Patient Active Problem List   Diagnosis Date Noted  . Dizziness and giddiness 10/01/2018  . PAD (peripheral artery disease) ([place]) 10/01/2018  . Cerebrovascular disease 02/07/2018  . CAD (coronary artery disease), native coronary artery 11/04/2017  . Vertigo 10/11/2017  . Right foot pain 08/31/2017  . Edema of right lower extremity 07/18/2017  . RLS (restless legs syndrome) 07/18/2017  . Hx of CABG 09/21/2016  . IBS (irritable  bowel syndrome) 11/03/2015  . Generalized anxiety disorder 01/20/2015  . Overweight 05/24/2014  . Unspecified vitamin D deficiency 05/22/2014  . OSA on CPAP 01/19/2014  . Lumbar pain with radiation down left leg 12/01/2013  . Contact eczema 10/15/2013  . Encounter for preventive health examination 01/13/2013  . GERD (gastroesophageal reflux disease) 01/06/2013  . Bilateral carotid artery stenosis 12/07/2012  . Hyperlipidemia LDL goal <70 07/09/2012  . Impaired glucose tolerance 07/09/2012  . Basal cell carcinoma 07/09/2012  . BPPV (benign paroxysmal positional vertigo) 07/09/2012  . Osteoporosis   . BPH (benign prostatic hyperplasia)   . Hypertension   . Myocardial infarction [facility])      Past Surgical History:  Procedure Laterality Date  . APPENDECTOMY  1951  . COLONOSCOPY    . COLONOSCOPY WITH PROPOFOL N/A 11/15/2015   Procedure: COLONOSCOPY WITH PROPOFOL;  Surgeon: Hulen Luster, MD;  Location: [facility] ENDOSCOPY;  Service: Gastroenterology;  Laterality: N/A;  . CORONARY ARTERY BYPASS GRAFT  2006   3 vessel, s/p AMI  . NASAL SEPTOPLASTY W/ TURBINOPLASTY  1981  . TONSILLECTOMY       Prior to Admission medications   Medication Sig Start Date End Date Taking? Authorizing Provider  acetaminophen (TYLENOL) 325 MG tablet Take 650 mg by mouth 2 (two) times daily as needed.    [provider]  amLODipine (NORVASC) 2.5 MG tablet Take 1 tablet (2.5 mg total) by mouth daily. 11/08/18  [name], MD  aspirin 81 MG chewable tablet Chew 81 mg by mouth daily.    [provider]  Blood Pressure Monitoring (BLOOD PRESSURE MONITOR AUTOMAT) DEVI Use daily to check blood pressure 11/25/14   [name], MD  Calcium Carbonate-Vitamin D (CALCIUM + D PO) Take 1 tablet by mouth daily.     [provider]  Cholecalciferol (VITAMIN D-3) 1000 UNITS CAPS Take 1 capsule by mouth daily.    [provider]  diclofenac sodium (VOLTAREN) 1 % GEL Apply 4 g topically 4  (four) times daily. 08/24/17   [name], MD  finasteride (PROSCAR) 5 MG tablet TAKE 1 TABLET BY MOUTH  DAILY 05/31/18   [name], MD  fluocinonide cream (LIDEX) 7.34 % Apply 1 application topically 2 (two) times daily.    [provider]  lisinopril (PRINIVIL,ZESTRIL) 20 MG tablet TAKE 1 TABLET BY MOUTH  DAILY 05/31/18   [name], MD  meclizine (ANTIVERT) 12.5 MG tablet TAKE 1 TABLET (12.5 MG TOTAL) BY MOUTH 3 (THREE) TIMES DAILY AS NEEDED. 10/11/17   Burnard Hawthorne, FNP  metoprolol tartrate (LOPRESSOR) 25 MG tablet TAKE ONE-HALF TABLET BY  MOUTH TWO TIMES DAILY 05/31/18   [name], MD  omeprazole (PRILOSEC) 20 MG capsule TAKE 1 CAPSULE (20 MG TOTAL) BY MOUTH DAILY. 11/14/17   [name], MD  oxybutynin (DITROPAN) 5 MG tablet Take 1 tablet (5 mg total) by mouth 2 (two) times daily. 04/10/18   Festus Aloe, MD  polyethylene glycol powder (GLYCOLAX/MIRALAX) powder Take 17 g by mouth once. Dissolve 1 capful of power into any liquid and drink once daily Patient taking differently: Take 17 g by mouth 2 (two) times daily. Dissolve 1 capful of power into any liquid and drink once daily 11/01/15   Julianne Rice, MD  rOPINIRole (REQUIP) 0.25 MG tablet TAKE 1 TABLET (0.25 MG TOTAL) BY MOUTH AT BEDTIME. INCREASE WEEKLY IF NEEDED 08/09/18   [name], MD  simvastatin (ZOCOR) 20 MG tablet TAKE 1 TABLET BY MOUTH AT  BEDTIME 05/31/18   [name], MD  tamsulosin (FLOMAX) 0.4 MG CAPS capsule TAKE ONE CAPSULE BY MOUTH EVERY DAY AFTER BREAKFAST 08/09/18   [name], MD     Allergies Sulfacetamide sodium and Sulfa antibiotics   Family History  Problem Relation Age of Onset  . Hyperlipidemia Mother   . Hypertension Mother   . Hypertension Father     Social History Social History   Tobacco Use  . Smoking status: Former Smoker    Types: Cigarettes    Last attempt to quit: 07/10/1963    Years since quitting: 55.4  . Smokeless tobacco: Never Used   Substance Use Topics  . Alcohol use: No  . Drug use: No    Review of Systems  Constitutional:   No fever or chills.  ENT:   No sore throat. No rhinorrhea. Cardiovascular:   No chest pain positive syncope. Respiratory:   No dyspnea or cough. Gastrointestinal:   Negative for abdominal pain, vomiting and diarrhea.  Musculoskeletal:   Negative for focal pain or swelling All other systems reviewed and are negative except as documented above in ROS and HPI.  ____________________________________________   PHYSICAL EXAM:  VITAL SIGNS: ED Triage Vitals  Enc Vitals Group     BP 12/15/18 1812 (!) 142/75     Pulse Rate 12/15/18 1812 85     Resp 12/15/18 1812 16  Temp 12/15/18 1812 98.1 F (36.7 C)     Temp Source 12/15/18 1812 Oral     SpO2 12/15/18 1812 96 %     Weight 12/15/18 1810 204 lb (92.5 kg)     Height 12/15/18 1810 5\' 10"  (1.778 m)     Head Circumference --      Peak Flow --      Pain Score 12/15/18 1808 3     Pain Loc --      Pain Edu? --      Excl. in [place]? --     Vital signs reviewed, nursing assessments reviewed.   Constitutional:   Alert and oriented. Non-toxic appearance. Eyes:   Conjunctivae are normal. EOMI. PERRL. ENT      Head:   Normocephalic and atraumatic.      Nose:   No congestion/rhinnorhea.       Mouth/Throat:   Dry mucous membranes, no pharyngeal erythema. No peritonsillar mass.       Neck:   No meningismus. Full ROM. Hematological/Lymphatic/Immunilogical:   No cervical lymphadenopathy. Cardiovascular:   RRR. Symmetric bilateral radial and DP pulses.  No murmurs. Cap refill less than 2 seconds. Respiratory:   Normal respiratory effort without tachypnea/retractions.  Diminished breath sounds at right base.  No crackles or wheezing. Gastrointestinal:   Abdomen is impressively distended and tympanitic.  Nontender.  No rigidity guarding or rebound..  There is no CVA tenderness.   Musculoskeletal:   Normal range of motion in all extremities. No  joint effusions.  No lower extremity tenderness.  No edema. Neurologic:   Normal speech and language.  Motor grossly intact. No acute focal neurologic deficits are appreciated.  Skin:    Skin is warm, dry and intact. No rash noted.  No petechiae, purpura, or bullae.  ____________________________________________    LABS (pertinent positives/negatives) (all labs ordered are listed, but only abnormal results are displayed) Labs Reviewed  BASIC METABOLIC PANEL - Abnormal; Notable for the following components:      Result Value   Glucose, Bld 166 (*)    All other components within normal limits  CBC  TROPONIN I  URINALYSIS, COMPLETE (UACMP) WITH MICROSCOPIC   ____________________________________________   EKG  Interpreted by me Normal sinus rhythm rate of 86, normal intervals and axis.  Normal QRS ST segments and T waves.  ____________________________________________    RADIOLOGY  Dg Chest 2 View  Result Date: 12/15/2018 CLINICAL DATA:  Syncope EXAM: CHEST - 2 VIEW COMPARISON:  None. FINDINGS: Mild bibasilar scarring/atelectasis. Lungs are otherwise clear. No pleural effusion or pneumothorax. The heart is normal in size. Lucency beneath the right hemidiaphragm corresponds to bowel when correlating with concurrent CT. Median sternotomy. Degenerative changes of the visualized thoracolumbar spine. Moderate wedging at T12, chronic. IMPRESSION: No active cardiopulmonary disease. Electronically Signed   By: Julian Hy M.D.   On: 12/15/2018 22:41   Ct Head Wo Contrast  Result Date: 12/15/2018 CLINICAL DATA:  Syncope with head injury EXAM: CT HEAD WITHOUT CONTRAST TECHNIQUE: Contiguous axial images were obtained from the base of the skull through the vertex without intravenous contrast. COMPARISON:  MRI 01/02/2018. CT scan 01/06/2014 FINDINGS: Brain: There is no evidence for acute hemorrhage, hydrocephalus, mass lesion, or abnormal extra-axial fluid collection. No definite CT  evidence for acute infarction. Patchy low attenuation in the deep hemispheric and periventricular white matter is nonspecific, but likely reflects chronic microvascular ischemic demyelination. Vascular: No hyperdense vessel or unexpected calcification. Skull: No evidence for fracture. No worrisome lytic or  sclerotic lesion. Sinuses/Orbits: The visualized paranasal sinuses and mastoid air cells are clear. Visualized portions of the globes and intraorbital fat are unremarkable. Other: None. IMPRESSION: 1. No acute intracranial abnormality. 2. Chronic small vessel white matter ischemic disease. Electronically Signed   By: Misty Stanley M.D.   On: 12/15/2018 19:44   Ct Abdomen Pelvis W Contrast  Result Date: 12/15/2018 CLINICAL DATA:  85 year old with a syncopal episode earlier this evening EXAM: CT ABDOMEN AND PELVIS WITH CONTRAST TECHNIQUE: Multidetector CT imaging of the abdomen and pelvis was performed using the standard protocol following bolus administration of intravenous contrast. CONTRAST:  153mL OMNIPAQUE IOHEXOL 300 MG/ML IV. COMPARISON:  09/01/2016, 10/31/2015 and earlier. FINDINGS: Lower chest: Atelectasis and/or scarring in the lower lobes, right middle lobe and lingula. Heart moderately enlarged with three-vessel native coronary atherosclerosis and prior CABG. Hepatobiliary: Benign cysts in the MEDIAL segment LEFT lobe of liver and RIGHT lobe near the dome, unchanged. No new or significant hepatic parenchymal masses. Gallbladder normal in appearance without calcified gallstones. Gallstones within the otherwise normal appearing gallbladder. No biliary ductal dilation. Pancreas: Moderate atrophy. No mass or peripancreatic inflammation. Spleen: Normal in size and appearance. Adrenals/Urinary Tract: Normal appearing adrenal glands. Diffuse cortical thinning involving both kidneys. Benign cysts arising from the UPPER pole of the RIGHT kidney. No significant focal parenchymal abnormality involving either  kidney no hydronephrosis. No urinary tract calculi. Normal appearing urinary bladder. Stomach/Bowel: Stomach normal in appearance for the degree of distention. Normal-appearing small bowel. Very large stool burden throughout the tortuous and elongated colon with the exception of the completely collapsed splenic flexure. No evidence of diverticulosis. Appendix surgically absent. Vascular/Lymphatic: Severe aorto-iliofemoral atherosclerosis without evidence of aneurysm. Normal-appearing portal venous and systemic venous systems. No pathologic lymphadenopathy. Reproductive: Normal sized prostate gland containing calcifications. Normal seminal vesicles. Other: None. Musculoskeletal: Remote T12 compression fracture, unchanged. Severe facet degenerative changes throughout the lumbar spine with slight grade 1 spondylolisthesis of L4 on L5, unchanged. DISH involving the LOWER thoracic spine. No acute findings. IMPRESSION: 1. No acute abnormalities involving the abdomen or pelvis. Very large colonic stool burden. 2. Cholelithiasis without evidence of acute cholecystitis. 3.  Aortic Atherosclerosis (ICD10-170.0) Electronically Signed   By: Evangeline Dakin M.D.   On: 12/15/2018 23:18    ____________________________________________   PROCEDURES Procedures  ____________________________________________  DIFFERENTIAL DIAGNOSIS   Bowel perforation, obstruction, pneumonia, pleural effusion, intracranial hemorrhage, dehydration, electrolyte disturbance, paroxysmal dysrhythmia.  Doubt stroke,, PE, dissection, AAA, mesenteric ischemia  CLINICAL IMPRESSION / ASSESSMENT AND PLAN / ED COURSE  Pertinent labs & imaging results that were available during my care of the patient were reviewed by me and considered in my medical decision making (see chart for details).    Patient presents with syncope and loss of appetite today, resulting in head injury.  CT of the head negative for intracranial hemorrhage.  Labs  unremarkable, hemoglobin stable.  Highest concern for abdominal pathology.  Will get a CT scan of the abdomen.  Also chest x-ray.  IV fluids for hydration.  No evidence of GI bleed.  At rest, vital signs unremarkable.  Counseled the patient that if imaging is unremarkable, with his recurrent syncope age and comorbidities, I would be worried about dysrhythmia or a silent cardiac event and would plan for overnight observation in the hospital for telemetry monitoring.  ----------------------------------------- 12:12 AM on 12/16/2018 -----------------------------------------  CT abdomen pelvis unremarkable.  Chest x-ray unremarkable.  Discussed with hospitalist.      ____________________________________________   FINAL CLINICAL IMPRESSION(S) / ED  DIAGNOSES    Final diagnoses:  Syncope, unspecified syncope type     ED Discharge Orders    None      Portions of this note were generated with dragon dictation software. Dictation errors may occur despite best attempts at proofreading.   Carrie Mew, MD 12/16/18 [phone]

## 2018-12-16 ENCOUNTER — Encounter: Payer: Self-pay | Admitting: Internal Medicine

## 2018-12-16 ENCOUNTER — Other Ambulatory Visit: Payer: Self-pay

## 2018-12-16 DIAGNOSIS — R569 Unspecified convulsions: Secondary | ICD-10-CM | POA: Diagnosis present

## 2018-12-16 DIAGNOSIS — I10 Essential (primary) hypertension: Secondary | ICD-10-CM | POA: Diagnosis not present

## 2018-12-16 DIAGNOSIS — R55 Syncope and collapse: Secondary | ICD-10-CM | POA: Diagnosis not present

## 2018-12-16 LAB — URINALYSIS, COMPLETE (UACMP) WITH MICROSCOPIC
Bacteria, UA: NONE SEEN
Bilirubin Urine: NEGATIVE
Glucose, UA: NEGATIVE mg/dL
Hgb urine dipstick: NEGATIVE
Ketones, ur: 5 mg/dL — AB
Leukocytes, UA: NEGATIVE
Nitrite: NEGATIVE
Protein, ur: NEGATIVE mg/dL
Specific Gravity, Urine: >1.046 — ABNORMAL HIGH (ref 1.005–1.030)
Squamous Epithelial / LPF: NONE SEEN (ref 0–5)
pH: 6 (ref 5.0–8.0)

## 2018-12-16 LAB — TSH: TSH: 2.667 u[IU]/mL (ref 0.350–4.500)

## 2018-12-16 MED ORDER — OXYCODONE-ACETAMINOPHEN 5-325 MG PO TABS
1.0000 | ORAL_TABLET | Freq: Four times a day (QID) | ORAL | Status: DC | PRN
  Administered 2018-12-16: 1 via ORAL
  Filled 2018-12-16: qty 1

## 2018-12-16 MED ORDER — METOPROLOL TARTRATE 25 MG PO TABS
12.5000 mg | ORAL_TABLET | Freq: Two times a day (BID) | ORAL | Status: DC
  Filled 2018-12-16: qty 1

## 2018-12-16 MED ORDER — ASPIRIN EC 81 MG PO TBEC
81.0000 mg | DELAYED_RELEASE_TABLET | Freq: Every day | ORAL | Status: DC
  Administered 2018-12-16 – 2018-12-17 (×2): 81 mg via ORAL
  Filled 2018-12-16 (×2): qty 1

## 2018-12-16 MED ORDER — SIMETHICONE 80 MG PO CHEW
120.0000 mg | CHEWABLE_TABLET | ORAL | Status: DC | PRN
  Filled 2018-12-16: qty 2

## 2018-12-16 MED ORDER — POLYETHYLENE GLYCOL 3350 17 GM/SCOOP PO POWD
17.0000 g | Freq: Every day | ORAL | Status: DC
  Filled 2018-12-16: qty 255

## 2018-12-16 MED ORDER — METOPROLOL TARTRATE 25 MG PO TABS: 12.5000 mg | ORAL_TABLET | Freq: Two times a day (BID) | ORAL | Status: DC

## 2018-12-16 MED ORDER — OXYBUTYNIN CHLORIDE 5 MG PO TABS
5.0000 mg | ORAL_TABLET | Freq: Two times a day (BID) | ORAL | Status: DC
  Administered 2018-12-16 – 2018-12-17 (×3): 5 mg via ORAL
  Filled 2018-12-16 (×6): qty 1

## 2018-12-16 MED ORDER — SIMVASTATIN 20 MG PO TABS
20.0000 mg | ORAL_TABLET | Freq: Every day | ORAL | Status: DC
  Administered 2018-12-16: 20 mg via ORAL
  Filled 2018-12-16 (×2): qty 1

## 2018-12-16 MED ORDER — TAMSULOSIN HCL 0.4 MG PO CAPS
0.4000 mg | ORAL_CAPSULE | Freq: Every day | ORAL | Status: DC
  Administered 2018-12-16: 0.4 mg via ORAL
  Filled 2018-12-16: qty 1

## 2018-12-16 MED ORDER — ONDANSETRON HCL 4 MG PO TABS: 4.0000 mg | ORAL_TABLET | Freq: Four times a day (QID) | ORAL | Status: DC | PRN

## 2018-12-16 MED ORDER — MECLIZINE HCL 12.5 MG PO TABS
12.5000 mg | ORAL_TABLET | Freq: Three times a day (TID) | ORAL | Status: DC | PRN
  Filled 2018-12-16: qty 1

## 2018-12-16 MED ORDER — VITAMIN D-3 25 MCG (1000 UT) PO CAPS: 1.0000 | ORAL_CAPSULE | Freq: Every day | ORAL | Status: DC

## 2018-12-16 MED ORDER — ENOXAPARIN SODIUM 40 MG/0.4ML ~~LOC~~ SOLN
40.0000 mg | SUBCUTANEOUS | Status: DC
  Administered 2018-12-16: 40 mg via SUBCUTANEOUS
  Filled 2018-12-16: qty 0.4

## 2018-12-16 MED ORDER — VITAMIN D 25 MCG (1000 UNIT) PO TABS
1000.0000 [IU] | ORAL_TABLET | Freq: Every day | ORAL | Status: DC
  Administered 2018-12-17: 1000 [IU] via ORAL
  Filled 2018-12-16: qty 1

## 2018-12-16 MED ORDER — DICLOFENAC SODIUM 1 % TD GEL
4.0000 g | Freq: Four times a day (QID) | TRANSDERMAL | Status: DC | PRN
  Filled 2018-12-16: qty 100

## 2018-12-16 MED ORDER — ACETAMINOPHEN 325 MG PO TABS
650.0000 mg | ORAL_TABLET | Freq: Four times a day (QID) | ORAL | Status: DC | PRN
  Administered 2018-12-16 – 2018-12-17 (×2): 650 mg via ORAL
  Filled 2018-12-16 (×3): qty 2

## 2018-12-16 MED ORDER — SIMETHICONE EXTRA STRENGTH 125 MG PO CAPS: 125.0000 mg | ORAL_CAPSULE | ORAL | Status: DC | PRN

## 2018-12-16 MED ORDER — ENOXAPARIN SODIUM 40 MG/0.4ML ~~LOC~~ SOLN
40.0000 mg | Freq: Every day | SUBCUTANEOUS | Status: DC
  Administered 2018-12-17: 40 mg via SUBCUTANEOUS
  Filled 2018-12-16: qty 0.4

## 2018-12-16 MED ORDER — ONDANSETRON HCL 4 MG/2ML IJ SOLN: 4.0000 mg | Freq: Four times a day (QID) | INTRAMUSCULAR | Status: DC | PRN

## 2018-12-16 MED ORDER — FINASTERIDE 5 MG PO TABS
5.0000 mg | ORAL_TABLET | Freq: Every day | ORAL | Status: DC
  Administered 2018-12-16 – 2018-12-17 (×2): 5 mg via ORAL
  Filled 2018-12-16 (×4): qty 1

## 2018-12-16 MED ORDER — ROPINIROLE HCL 0.25 MG PO TABS
0.2500 mg | ORAL_TABLET | Freq: Every day | ORAL | Status: DC
  Administered 2018-12-16: 0.25 mg via ORAL
  Filled 2018-12-16 (×2): qty 1

## 2018-12-16 MED ORDER — METOPROLOL TARTRATE 25 MG PO TABS
12.5000 mg | ORAL_TABLET | Freq: Two times a day (BID) | ORAL | Status: DC
  Administered 2018-12-16 – 2018-12-17 (×2): 12.5 mg via ORAL
  Filled 2018-12-16 (×3): qty 1

## 2018-12-16 MED ORDER — SODIUM CHLORIDE 0.9 % IV SOLN
INTRAVENOUS | Status: DC
  Administered 2018-12-16 – 2018-12-17 (×3): via INTRAVENOUS

## 2018-12-16 MED ORDER — ACETAMINOPHEN 650 MG RE SUPP: 650.0000 mg | Freq: Four times a day (QID) | RECTAL | Status: DC | PRN

## 2018-12-16 MED ORDER — FLUOCINONIDE 0.05 % EX CREA
1.0000 "application " | TOPICAL_CREAM | Freq: Two times a day (BID) | CUTANEOUS | Status: DC | PRN
  Filled 2018-12-16: qty 15

## 2018-12-16 MED ORDER — PANTOPRAZOLE SODIUM 40 MG PO TBEC
40.0000 mg | DELAYED_RELEASE_TABLET | Freq: Every day | ORAL | Status: DC
  Administered 2018-12-16 – 2018-12-17 (×2): 40 mg via ORAL
  Filled 2018-12-16 (×2): qty 1

## 2018-12-16 MED ORDER — IBUPROFEN 400 MG PO TABS: 400.0000 mg | ORAL_TABLET | Freq: Four times a day (QID) | ORAL | Status: DC | PRN

## 2018-12-16 MED ORDER — DOCUSATE SODIUM 100 MG PO CAPS
100.0000 mg | ORAL_CAPSULE | Freq: Two times a day (BID) | ORAL | Status: DC
  Administered 2018-12-16 – 2018-12-17 (×3): 100 mg via ORAL
  Filled 2018-12-16 (×3): qty 1

## 2018-12-16 NOTE — H&P (Signed)
David Atkins is an 85 y.o. male.   Chief Complaint: Loss of consciousness HPI: The patient with past medical history of CAD status post MI and CABG, hypertension and BPH presents to the emergency department following an episode of syncope.  The patient had eaten dinner and was returning home when he reached down to pick up some mail that was on the floor.  As he stood he seemed to fall backward unconscious and struck his head.  The patient soon regained consciousness.  There was no seizure activity.  However, later he had an episode of pallor and confusion followed by palpitations.  In the emergency department the patient was found to be normotensive without significant laboratory derangements except for concentrated urine.  He denies chest pain or shortness of breath, nausea, vomiting or diarrhea.  However, the patient does admit that he has some bloating and occasional constipation.  Past Medical History:  Diagnosis Date  . 3-vessel coronary artery disease 2006   [place]  . basal cell   . BPH (benign prostatic hyperplasia)   . GERD (gastroesophageal reflux disease)   . Hyperlipidemia   . Hypertension   . Lumbar stenosis 12/03/13   MRI - lumbar pain with radiation down to LE  . Myocardial infarction ([place]) 2006  . OAB (overactive bladder)   . Osteoporosis 2010   by DEXA at [facility]  . Sleep apnea   . Spondylolisthesis of lumbar region 11/23/13   MRI Lumbar spine    Past Surgical History:  Procedure Laterality Date  . APPENDECTOMY  1951  . COLONOSCOPY    . COLONOSCOPY WITH PROPOFOL N/A 11/15/2015   Procedure: COLONOSCOPY WITH PROPOFOL;  Surgeon: Hulen Luster, MD;  Location: [facility] ENDOSCOPY;  Service: Gastroenterology;  Laterality: N/A;  . CORONARY ARTERY BYPASS GRAFT  2006   3 vessel, s/p AMI  . NASAL SEPTOPLASTY W/ TURBINOPLASTY  1981  . TONSILLECTOMY      Family History  Problem Relation Age of Onset  . Hyperlipidemia Mother   . Hypertension Mother   . Hypertension Father    Social  History:  reports that he quit smoking about 55 years ago. His smoking use included cigarettes. He has never used smokeless tobacco. He reports that he does not drink alcohol or use drugs.  Allergies:  Allergies  Allergen Reactions  . Sulfa Antibiotics Rash    Prior to Admission medications   Medication Sig Start Date End Date Taking? Authorizing Provider  acetaminophen (TYLENOL) 500 MG tablet Take 500 mg by mouth at bedtime.    Yes [provider]  aspirin 81 MG EC tablet Take 81 mg by mouth daily.    Yes [provider]  Calcium Carbonate-Vitamin D (CALCIUM + D PO) Take 1 tablet by mouth daily.    Yes [provider]  Cholecalciferol (VITAMIN D-3) 1000 UNITS CAPS Take 1 capsule by mouth daily.   Yes [provider]  finasteride (PROSCAR) 5 MG tablet TAKE 1 TABLET BY MOUTH  DAILY Patient taking differently: Take 5 mg by mouth daily.  05/31/18  Yes [name], MD  metoprolol tartrate (LOPRESSOR) 25 MG tablet TAKE ONE-HALF TABLET BY  MOUTH TWO TIMES DAILY Patient taking differently: Take 12.5 mg by mouth 2 (two) times daily. TAKE ONE-HALF TABLET BY  MOUTH TWO TIMES DAILY 05/31/18  Yes [name], MD  omeprazole (PRILOSEC) 20 MG capsule TAKE 1 CAPSULE (20 MG TOTAL) BY MOUTH DAILY. Patient taking differently: Take 20 mg by mouth daily.  11/14/17  Yes [name], MD  oxybutynin (DITROPAN) 5 MG tablet Take 1 tablet (5 mg total) by mouth 2 (two) times daily. 04/10/18  Yes Festus Aloe, MD  polyethylene glycol powder (GLYCOLAX/MIRALAX) powder Take 17 g by mouth once. Dissolve 1 capful of power into any liquid and drink once daily Patient taking differently: Take 17 g by mouth at bedtime. Dissolve 1 capful of power into any liquid and drink once daily 11/01/15  Yes Julianne Rice, MD  rOPINIRole (REQUIP) 0.25 MG tablet TAKE 1 TABLET (0.25 MG TOTAL) BY MOUTH AT BEDTIME. INCREASE WEEKLY IF NEEDED 08/09/18  Yes [name], MD  Simethicone Extra  Strength 125 MG CAPS Take 125 mg by mouth as needed (gas).   Yes [provider]  simvastatin (ZOCOR) 20 MG tablet TAKE 1 TABLET BY MOUTH AT  BEDTIME Patient taking differently: Take 20 mg by mouth at bedtime.  05/31/18  Yes [name], MD  tamsulosin (FLOMAX) 0.4 MG CAPS capsule TAKE ONE CAPSULE BY MOUTH EVERY DAY AFTER BREAKFAST Patient taking differently: Take 0.4 mg by mouth daily.  08/09/18  Yes [name], MD  amLODipine (NORVASC) 2.5 MG tablet Take 1 tablet (2.5 mg total) by mouth daily. Patient taking differently: Take 2.5 mg by mouth daily as needed (elevated bp).  11/08/18   [name], MD  Blood Pressure Monitoring (BLOOD PRESSURE MONITOR AUTOMAT) DEVI Use daily to check blood pressure 11/25/14   [name], MD  diclofenac sodium (VOLTAREN) 1 % GEL Apply 4 g topically 4 (four) times daily. Patient taking differently: Apply 4 g topically 4 (four) times daily as needed (pain).  08/24/17   [name], MD  fluocinonide cream (LIDEX) 3.76 % Apply 1 application topically 2 (two) times daily as needed (rash).     [provider]  lisinopril (PRINIVIL,ZESTRIL) 20 MG tablet TAKE 1 TABLET BY MOUTH  DAILY Patient taking differently: Take 20 mg by mouth daily as needed (elevated bp).  05/31/18   [name], MD  meclizine (ANTIVERT) 12.5 MG tablet TAKE 1 TABLET (12.5 MG TOTAL) BY MOUTH 3 (THREE) TIMES DAILY AS NEEDED. Patient taking differently: Take 12.5 mg by mouth 3 (three) times daily as needed for dizziness.  10/11/17   Burnard Hawthorne, FNP     Results for orders placed or performed during the hospital encounter of 12/15/18 (from the past 48 hour(s))  Basic metabolic panel     Status: Abnormal   Collection Time: 12/15/18  6:13 PM  Result Value Ref Range   Sodium 139 135 - 145 mmol/L   Potassium 3.6 3.5 - 5.1 mmol/L   Chloride 106 98 - 111 mmol/L   CO2 26 22 - 32 mmol/L   Glucose, Bld 166 (H) 70 - 99 mg/dL   BUN 18 8 - 23 mg/dL   Creatinine,  Ser 0.86 0.61 - 1.24 mg/dL   Calcium 8.9 8.9 - 10.3 mg/dL   GFR calc non Af Amer >60 >60 mL/min   GFR calc Af Amer >60 >60 mL/min   Anion gap 7 5 - 15    Comment: Performed at [facility], [address] [place], [place] [address]  CBC     Status: None   Collection Time: 12/15/18  6:13 PM  Result Value Ref Range   WBC 8.9 4.0 - 10.5 K/uL   RBC 4.67 4.22 - 5.81 MIL/uL   Hemoglobin 15.2 13.0 - 17.0 g/dL   HCT 45.4 39.0 - 52.0 %  MCV 97.2 80.0 - 100.0 fL   MCH 32.5 26.0 - 34.0 pg   MCHC 33.5 30.0 - 36.0 g/dL   RDW 12.7 11.5 - 15.5 %   Platelets 207 150 - 400 K/uL   nRBC 0.0 0.0 - 0.2 %    Comment: Performed at [facility], [address] [place], [place] [address]  Troponin I - ONCE - STAT     Status: None   Collection Time: 12/15/18  6:13 PM  Result Value Ref Range   Troponin I <0.03 <0.03 ng/mL    Comment: Performed at [facility], [address] [place], [place] [address]  Urinalysis, Complete w Microscopic     Status: Abnormal   Collection Time: 12/16/18 12:50 AM  Result Value Ref Range   Color, Urine YELLOW (A) YELLOW   APPearance CLEAR (A) CLEAR   Specific Gravity, Urine >1.046 (H) 1.005 - 1.030   pH 6.0 5.0 - 8.0   Glucose, UA NEGATIVE NEGATIVE mg/dL   Hgb urine dipstick NEGATIVE NEGATIVE   Bilirubin Urine NEGATIVE NEGATIVE   Ketones, ur 5 (A) NEGATIVE mg/dL   Protein, ur NEGATIVE NEGATIVE mg/dL   Nitrite NEGATIVE NEGATIVE   Leukocytes, UA NEGATIVE NEGATIVE   RBC / HPF 0-5 0 - 5 RBC/hpf   WBC, UA 0-5 0 - 5 WBC/hpf   Bacteria, UA NONE SEEN NONE SEEN   Squamous Epithelial / LPF NONE SEEN 0 - 5   Mucus PRESENT     Comment: Performed at [facility], [address], [place], [place] [address]   Dg Chest 2 View  Result Date: 12/15/2018 CLINICAL DATA:  Syncope EXAM: CHEST - 2 VIEW COMPARISON:  None. FINDINGS: Mild bibasilar scarring/atelectasis. Lungs are otherwise clear. No pleural effusion or pneumothorax. The heart is  normal in size. Lucency beneath the right hemidiaphragm corresponds to bowel when correlating with concurrent CT. Median sternotomy. Degenerative changes of the visualized thoracolumbar spine. Moderate wedging at T12, chronic. IMPRESSION: No active cardiopulmonary disease. Electronically Signed   By: Julian Hy M.D.   On: 12/15/2018 22:41   Ct Head Wo Contrast  Result Date: 12/15/2018 CLINICAL DATA:  Syncope with head injury EXAM: CT HEAD WITHOUT CONTRAST TECHNIQUE: Contiguous axial images were obtained from the base of the skull through the vertex without intravenous contrast. COMPARISON:  MRI 01/02/2018. CT scan 01/06/2014 FINDINGS: Brain: There is no evidence for acute hemorrhage, hydrocephalus, mass lesion, or abnormal extra-axial fluid collection. No definite CT evidence for acute infarction. Patchy low attenuation in the deep hemispheric and periventricular white matter is nonspecific, but likely reflects chronic microvascular ischemic demyelination. Vascular: No hyperdense vessel or unexpected calcification. Skull: No evidence for fracture. No worrisome lytic or sclerotic lesion. Sinuses/Orbits: The visualized paranasal sinuses and mastoid air cells are clear. Visualized portions of the globes and intraorbital fat are unremarkable. Other: None. IMPRESSION: 1. No acute intracranial abnormality. 2. Chronic small vessel white matter ischemic disease. Electronically Signed   By: Misty Stanley M.D.   On: 12/15/2018 19:44   Ct Abdomen Pelvis W Contrast  Result Date: 12/15/2018 CLINICAL DATA:  85 year old with a syncopal episode earlier this evening EXAM: CT ABDOMEN AND PELVIS WITH CONTRAST TECHNIQUE: Multidetector CT imaging of the abdomen and pelvis was performed using the standard protocol following bolus administration of intravenous contrast. CONTRAST:  158mL OMNIPAQUE IOHEXOL 300 MG/ML IV. COMPARISON:  09/01/2016, 10/31/2015 and earlier. FINDINGS: Lower chest: Atelectasis and/or scarring in the  lower lobes, right middle lobe and lingula. Heart moderately enlarged  with three-vessel native coronary atherosclerosis and prior CABG. Hepatobiliary: Benign cysts in the MEDIAL segment LEFT lobe of liver and RIGHT lobe near the dome, unchanged. No new or significant hepatic parenchymal masses. Gallbladder normal in appearance without calcified gallstones. Gallstones within the otherwise normal appearing gallbladder. No biliary ductal dilation. Pancreas: Moderate atrophy. No mass or peripancreatic inflammation. Spleen: Normal in size and appearance. Adrenals/Urinary Tract: Normal appearing adrenal glands. Diffuse cortical thinning involving both kidneys. Benign cysts arising from the UPPER pole of the RIGHT kidney. No significant focal parenchymal abnormality involving either kidney no hydronephrosis. No urinary tract calculi. Normal appearing urinary bladder. Stomach/Bowel: Stomach normal in appearance for the degree of distention. Normal-appearing small bowel. Very large stool burden throughout the tortuous and elongated colon with the exception of the completely collapsed splenic flexure. No evidence of diverticulosis. Appendix surgically absent. Vascular/Lymphatic: Severe aorto-iliofemoral atherosclerosis without evidence of aneurysm. Normal-appearing portal venous and systemic venous systems. No pathologic lymphadenopathy. Reproductive: Normal sized prostate gland containing calcifications. Normal seminal vesicles. Other: None. Musculoskeletal: Remote T12 compression fracture, unchanged. Severe facet degenerative changes throughout the lumbar spine with slight grade 1 spondylolisthesis of L4 on L5, unchanged. DISH involving the LOWER thoracic spine. No acute findings. IMPRESSION: 1. No acute abnormalities involving the abdomen or pelvis. Very large colonic stool burden. 2. Cholelithiasis without evidence of acute cholecystitis. 3.  Aortic Atherosclerosis (ICD10-170.0) Electronically Signed   By: Evangeline Dakin M.D.   On: 12/15/2018 23:18    Review of Systems  Constitutional: Negative for chills and fever.  HENT: Negative for sore throat and tinnitus.   Eyes: Negative for blurred vision and redness.  Respiratory: Negative for cough and shortness of breath.   Cardiovascular: Negative for chest pain, palpitations, orthopnea and PND.  Gastrointestinal: Negative for abdominal pain, diarrhea, nausea and vomiting.  Genitourinary: Negative for dysuria, frequency and urgency.  Musculoskeletal: Negative for joint pain and myalgias.  Skin: Negative for rash.       No lesions  Neurological: Positive for loss of consciousness. Negative for speech change, focal weakness and weakness.  Endo/Heme/Allergies: Does not bruise/bleed easily.       No temperature intolerance  Psychiatric/Behavioral: Negative for depression and suicidal ideas.    Blood pressure (!) 141/77, pulse 97, temperature 98.1 F (36.7 C), temperature source Oral, resp. rate 19, height 5\' 10"  (1.778 m), weight 92.5 kg, SpO2 95 %. Physical Exam  Vitals reviewed. Constitutional: He is oriented to person, place, and time. He appears well-developed and well-nourished. No distress.  HENT:  Head: Normocephalic and atraumatic.  Mouth/Throat: Oropharynx is clear and moist.  Eyes: Pupils are equal, round, and reactive to light. Conjunctivae and EOM are normal. No scleral icterus.  Neck: Normal range of motion. Neck supple. No JVD present. No tracheal deviation present. No thyromegaly present.  Cardiovascular: Normal rate, regular rhythm and normal heart sounds. Exam reveals no gallop and no friction rub.  No murmur heard. Respiratory: Effort normal and breath sounds normal. No respiratory distress.  GI: Soft. Bowel sounds are normal. He exhibits no distension. There is no abdominal tenderness.  Genitourinary:    Genitourinary Comments: Deferred   Musculoskeletal: Normal range of motion.        General: No edema.  Lymphadenopathy:     He has no cervical adenopathy.  Neurological: He is alert and oriented to person, place, and time. No cranial nerve deficit.  Skin: Skin is warm and dry. No rash noted. No erythema.  Psychiatric: He has a normal mood and affect.  His behavior is normal. Judgment and thought content normal.     Assessment/Plan This is an 85 year old male admitted for syncope. 1.  Syncope: Orthostatic and vasovagal components.  Hydrate with intravenous fluid.  Obtain echocardiogram to rule out valvular dysfunction.  EKG shows no arrhythmia.  Continue to monitor. 2.  Hypertension: Controlled; the patient takes metoprolol daily which seems to regulate his blood pressure and heart rate.  However he also takes lisinopril and amlodipine as needed.  He did not take either of those medications today thus it is unlikely that contributed to syncope.  However, if the patient is prone to orthostatic hypotension perhaps he should only use one or the other as needed for SBP greater than 150 3.  BPH: Continue Proscar and tamsulosin.  The patient takes one of his medications in the morning and 1 in afternoon, respectively.  Unlikely to have contributed to orthostatic hypotension in this case. 4.  DVT prophylaxis: Lovenox 5.  GI prophylaxis: None The patient is a full code.  Time spent on admission orders and patient care approximately 45 minutes   Harrie Foreman, MD 12/16/2018, 6:36 AM

## 2018-12-16 NOTE — Care Management Obs Status (Signed)
[place] NOTIFICATION   Patient Details  Name: David Atkins MRN: [id number] Date of Birth: Jun 07, 1929   Medicare Observation Status Notification Given:  Yes    Shelbie Hutching, RN 12/16/2018, 3:35 PM

## 2018-12-16 NOTE — Progress Notes (Signed)
Order received for PT (per family request) and home cpap use

## 2018-12-16 NOTE — ED Notes (Signed)
Patients daughter would like patient to possibly have Diclofenac instead of percocet for his pain due to percocet making him groggy

## 2018-12-16 NOTE — Progress Notes (Signed)
Patient has home CPAP in room but declined wanting to use it.

## 2018-12-16 NOTE — Care Management Note (Signed)
Case Management Note  Patient Details  Name: David Atkins MRN: [id number] Date of Birth: 1928-12-23  Subjective/Objective:  Patient is being placed in observation for loss of consciousness.  Patient reports that he was picking something up off the floor and passed out.  Patient is from home where he lives with his wife.  He reports he is very independent.  Patient drives.  He has 4 canes at home that he uses for ambulation.  PCP verified as Dr. Deborra Medina, pharmacy is [facility] [address]  No discharge needs identified at this time.  Doran Clay RN BSN [phone]                   Action/Plan:   Expected Discharge Date:                  Expected Discharge Plan:  Home/Self Care  In-House Referral:     Discharge planning Services  CM Consult  Post Acute Care Choice:    Choice offered to:     DME Arranged:    DME Agency:     HH Arranged:    HH Agency:     Status of Service:  In process, will continue to follow  If discussed at Long Length of Stay Meetings, dates discussed:    Additional Comments:  Shelbie Hutching, RN 12/16/2018, 3:36 PM

## 2018-12-17 ENCOUNTER — Observation Stay (HOSPITAL_BASED_OUTPATIENT_CLINIC_OR_DEPARTMENT_OTHER)
Admit: 2018-12-17 | Discharge: 2018-12-17 | Disposition: A | Discharge status: Home / Self Care | Payer: Medicare Other | Attending: Internal Medicine | Admitting: Internal Medicine

## 2018-12-17 DIAGNOSIS — R55 Syncope and collapse: Secondary | ICD-10-CM | POA: Diagnosis not present

## 2018-12-17 DIAGNOSIS — I10 Essential (primary) hypertension: Secondary | ICD-10-CM | POA: Diagnosis not present

## 2018-12-17 LAB — ECHOCARDIOGRAM COMPLETE
Height: 70 in
Weight: 3264 oz

## 2018-12-17 NOTE — Progress Notes (Signed)
Advance care planning  Purpose of Encounter Syncope, weakness  Parties in Attendance Patient, daughter Rip Harbour at bedside  Patients Decisional capacity Patient is alert and oriented.  Able to make medical decisions.  Regarding patient's syncope, investigations, treatment and prognosis.  Discussed regarding CODE STATUS.  Patient has documented healthcare power of attorney and advanced directives in place.  He wishes to be a full code at this point with aggressive full scope of treatment.  Time spent - 17 minutes

## 2018-12-17 NOTE — Progress Notes (Signed)
David Atkins to be D/C'd home per MD order.  Discussed prescriptions and follow up appointments with the patient. Prescriptions given to patient, medication list explained in detail. Pt verbalized understanding.  Allergies as of 12/17/2018      Reactions   Sulfa Antibiotics Rash      Medication List    TAKE these medications   acetaminophen 500 MG tablet Commonly known as:  TYLENOL Take 500 mg by mouth at bedtime.   amLODipine 2.5 MG tablet Commonly known as:  NORVASC Take 1 tablet (2.5 mg total) by mouth daily. What changed:    when to take this  reasons to take this   aspirin 81 MG EC tablet Take 81 mg by mouth daily.   Blood Pressure Monitor Automat Devi Use daily to check blood pressure   CALCIUM + D PO Take 1 tablet by mouth daily.   diclofenac sodium 1 % Gel Commonly known as:  VOLTAREN Apply 4 g topically 4 (four) times daily. What changed:    when to take this  reasons to take this   finasteride 5 MG tablet Commonly known as:  PROSCAR TAKE 1 TABLET BY MOUTH  DAILY   fluocinonide cream 0.05 % Commonly known as:  LIDEX Apply 1 application topically 2 (two) times daily as needed (rash).   lisinopril 20 MG tablet Commonly known as:  PRINIVIL,ZESTRIL TAKE 1 TABLET BY MOUTH  DAILY What changed:    when to take this  reasons to take this   meclizine 12.5 MG tablet Commonly known as:  ANTIVERT TAKE 1 TABLET (12.5 MG TOTAL) BY MOUTH 3 (THREE) TIMES DAILY AS NEEDED. What changed:    how much to take  how to take this  when to take this  reasons to take this  additional instructions   metoprolol tartrate 25 MG tablet Commonly known as:  LOPRESSOR TAKE ONE-HALF TABLET BY  MOUTH TWO TIMES DAILY What changed:    how much to take  how to take this  when to take this   omeprazole 20 MG capsule Commonly known as:  PRILOSEC TAKE 1 CAPSULE (20 MG TOTAL) BY MOUTH DAILY. What changed:  See the new instructions.   oxybutynin 5 MG  tablet Commonly known as:  DITROPAN Take 1 tablet (5 mg total) by mouth 2 (two) times daily.   polyethylene glycol powder powder Commonly known as:  GLYCOLAX/MIRALAX Take 17 g by mouth once. Dissolve 1 capful of power into any liquid and drink once daily What changed:  when to take this   rOPINIRole 0.25 MG tablet Commonly known as:  REQUIP TAKE 1 TABLET (0.25 MG TOTAL) BY MOUTH AT BEDTIME. INCREASE WEEKLY IF NEEDED   Simethicone Extra Strength 125 MG Caps Take 125 mg by mouth as needed (gas).   simvastatin 20 MG tablet Commonly known as:  ZOCOR TAKE 1 TABLET BY MOUTH AT  BEDTIME   tamsulosin 0.4 MG Caps capsule Commonly known as:  FLOMAX TAKE ONE CAPSULE BY MOUTH EVERY DAY AFTER BREAKFAST What changed:  See the new instructions.   Vitamin D-3 25 MCG (1000 UT) Caps Take 1 capsule by mouth daily.       Vitals:   12/16/18 2000 12/17/18 0331  BP: 125/62 (!) 142/65  Pulse: 76 74  Resp: (!) 22 (!) 22  Temp: 98.2 F (36.8 C) 98.1 F (36.7 C)  SpO2: 96% 97%    Skin clean, dry and intact without evidence of skin break down, no evidence of skin tears noted.  IV catheter discontinued intact. Site without signs and symptoms of complications. Dressing and pressure applied. Pt denies pain at this time. No complaints noted.  An After Visit Summary was printed and given to the patient. Patient escorted via [place], and D/C home via private auto.  [name]

## 2018-12-17 NOTE — Progress Notes (Signed)
*  PRELIMINARY RESULTS* Echocardiogram 2D Echocardiogram has been performed.  David Atkins 12/17/2018, 10:48 AM

## 2018-12-17 NOTE — Evaluation (Signed)
Physical Therapy Evaluation Patient Details Name: David Atkins MRN: [id number] DOB: 05/04/1929 Today's Date: 12/17/2018   History of Present Illness  Patient is an 85 year old male admitted s/p syncope and collapse in his home.  PMH includes lumbar spondylolisthesis, osteoporosis, MI, Htn, HLD, GERD, BPH and basal cell carcinoma.  Clinical Impression  Pt is an 85 year old male who lives with his wife and is a Hydrographic surveyor with use of SPC. Pt able to perform bed mobility independently and stand from bedside with supervision.  He presented with good to fait strength of UE's and LE's and reported no N/T in LE's.  Pt reported no symptoms of hypotension throughout evaluation.  He was able to complete 200 ft of ambulation with SPC.  PT did note gait deviations which could be indicative of fall risk and discussed benefit of OP PT with pt. Pt stated that he has an HEP from previous OP treatments which he needs to start doing.  Pt will continue to benefit from skilled PT with focus on balance, gait and prevention of future falls.      Follow Up Recommendations Outpatient PT    Equipment Recommendations  None recommended by PT    Recommendations for Other Services       Precautions / Restrictions Precautions Precautions: Fall Restrictions Weight Bearing Restrictions: No      Mobility  Bed Mobility Overal bed mobility: Independent                Transfers Overall transfer level: Needs assistance Equipment used: Straight cane Transfers: Sit to/from Stand Sit to Stand: Supervision         General transfer comment: Supervision provided due to recent syncope and collapse; pt able to rise without assistance.  No symptoms of hypotension reported.  Ambulation/Gait Ambulation/Gait assistance: Min guard Gait Distance (Feet): 200 Feet Assistive device: Straight cane     Gait velocity interpretation: 1.31 - 2.62 ft/sec, indicative of limited community ambulator General Gait  Details: Good foot clearance and step length; no LOB's. PT did note crossover gait every 10-15 steps and narrow BOS.  Hip abductor strength not formally tested.  Stairs            Wheelchair Mobility    Modified Rankin (Stroke Patients Only)       Balance Overall balance assessment: Needs assistance Sitting-balance support: Feet supported Sitting balance-Leahy Scale: Good     Standing balance support: Single extremity supported Standing balance-Leahy Scale: Fair Standing balance comment: Quiet stance: 10 sec without SPC, ft together: 10 sec, EC: 10 sec increased a-p sway, tandem: L forward: 2 sec, R forward: 6 sec.  Pt able to simulate opening cabinet door with unilateral UE support.  Deferred bending to lift object which pt states he will use a reacher from now on to pick up objects dropped on floor.                             Pertinent Vitals/Pain Pain Assessment: No/denies pain    Home Living Family/patient expects to be discharged to:: Private residence Living Arrangements: Spouse/significant other Available Help at Discharge: Family;Available 24 hours/day Type of Home: House Home Access: Ramped entrance     Home Layout: One level Home Equipment: Walker - 2 wheels;Cane - single point      Prior Function Level of Independence: Independent with assistive device(s)         Comments: Pt drives and community ambulates  with SPC; works in his woodshop regularly.     Hand Dominance        Extremity/Trunk Assessment   Upper Extremity Assessment Upper Extremity Assessment: Overall WFL for tasks assessed    Lower Extremity Assessment Lower Extremity Assessment: Overall WFL for tasks assessed(Grossly 4/5 bilaterally.)    Cervical / Trunk Assessment Cervical / Trunk Assessment: Kyphotic(Mildly kyphotic.)  Communication   Communication: HOH  Cognition Arousal/Alertness: Awake/alert Behavior During Therapy: WFL for tasks  assessed/performed Overall Cognitive Status: Within Functional Limits for tasks assessed                                        General Comments      Exercises     Assessment/Plan    PT Assessment Patient needs continued PT services  PT Problem List Decreased strength;Decreased balance       PT Treatment Interventions Gait training;Therapeutic exercise;Therapeutic activities;Balance training;Patient/family education    PT Goals (Current goals can be found in the Care Plan section)  Acute Rehab PT Goals Patient Stated Goal: To return to generally daily activity including working in his wood shop. PT Goal Formulation: With patient Time For Goal Achievement: 12/31/18 Potential to Achieve Goals: Good    Frequency Min 2X/week   Barriers to discharge        Co-evaluation               AM-PAC PT "6 Clicks" Mobility  Outcome Measure Help needed turning from your back to your side while in a flat bed without using bedrails?: None Help needed moving from lying on your back to sitting on the side of a flat bed without using bedrails?: None Help needed moving to and from a bed to a chair (including a wheelchair)?: None Help needed standing up from a chair using your arms (e.g., wheelchair or bedside chair)?: None Help needed to walk in hospital room?: A Little Help needed climbing 3-5 steps with a railing? : A Little 6 Click Score: 22    End of Session Equipment Utilized During Treatment: Gait belt Activity Tolerance: Patient tolerated treatment well Patient left: in chair;with call bell/phone within reach;with chair alarm set Nurse Communication: Mobility status PT Visit Diagnosis: Unsteadiness on feet (R26.81);History of falling (Z91.81)    Time: 4801-6553 PT Time Calculation (min) (ACUTE ONLY): 17 min   Charges:   PT Evaluation $PT Eval Low Complexity: 1 Low          Roxanne Gates, PT, DPT   Roxanne Gates 12/17/2018, 10:03 AM

## 2018-12-17 NOTE — Discharge Instructions (Signed)
Resume diet and activity as before ° ° °

## 2018-12-18 ENCOUNTER — Other Ambulatory Visit: Payer: Self-pay | Admitting: Internal Medicine

## 2018-12-18 ENCOUNTER — Telehealth: Payer: Self-pay | Admitting: Internal Medicine

## 2018-12-18 MED ORDER — HYDROCODONE-ACETAMINOPHEN 10-325 MG PO TABS: 1.0000 | ORAL_TABLET | Freq: Four times a day (QID) | ORAL | 0 refills | Status: DC | PRN

## 2018-12-18 MED ORDER — DICLOFENAC SODIUM 75 MG PO TBEC: 75.0000 mg | DELAYED_RELEASE_TABLET | Freq: Two times a day (BID) | ORAL | 1 refills | Status: DC

## 2018-12-18 NOTE — Telephone Encounter (Signed)
Transition Care Management Follow-up Telephone Call  How have you been since you were released from the hospital? Patient says he is feeling ok, denies dizziness, patient says he is having pain from where he fell when fainted in his ribs, and it is hard for him to move and he is not sleeping well.   Do you understand why you were in the hospital? yes   Do you understand the discharge instrcutions? yes  Items Reviewed:  Medications reviewed: yes  Allergies reviewed: yes  Dietary changes reviewed: yes  Referrals reviewed: yes   Functional Questionnaire:   Activities of Daily Living (ADLs):   He states they are independent in the following: ambulation, bathing and hygiene, feeding, continence, grooming, toileting and dressing States they require assistance with the following: continence and say he just takes longer just weak and sore.   Any transportation issues/concerns?: no   Any patient concerns? yes   Confirmed importance and date/time of follow-up visits scheduled: yes   Confirmed with patient if condition begins to worsen call PCP or go to the ER.  Patient was given the Call-a-Nurse line [phone]: yes

## 2018-12-18 NOTE — Progress Notes (Unsigned)
en

## 2018-12-18 NOTE — Telephone Encounter (Signed)
Called and notified patient as directed by PCP patient voiced understanding that he is on stool softener for hospital.

## 2018-12-18 NOTE — Telephone Encounter (Signed)
Yes I will send generic Vicodin to his pharmacy .  Warn him about preventing  constipation with Dulcolax,  Ex Lax  or Sennakot

## 2018-12-18 NOTE — Telephone Encounter (Signed)
Patient says he is having pain from where he fell when he  fainted in his ribs, and it is hard for him to move and he is not sleeping well. X-ray taken during admission do not report a fracture, patient says he has had rib fracture before and this feels the same. He is asking can something be given for pain, patient has tried tylenol 1000 mg TID.

## 2018-12-27 ENCOUNTER — Ambulatory Visit (INDEPENDENT_AMBULATORY_CARE_PROVIDER_SITE_OTHER): Payer: Medicare Other | Admitting: Internal Medicine

## 2018-12-27 ENCOUNTER — Encounter: Payer: Self-pay | Admitting: Internal Medicine

## 2018-12-27 VITALS — BP 132/58 | HR 61 | Temp 97.6°F | Resp 16 | Ht 70.0 in | Wt 208.0 lb

## 2018-12-27 DIAGNOSIS — I10 Essential (primary) hypertension: Secondary | ICD-10-CM | POA: Diagnosis not present

## 2018-12-27 DIAGNOSIS — S060X9D Concussion with loss of consciousness of unspecified duration, subsequent encounter: Secondary | ICD-10-CM

## 2018-12-27 DIAGNOSIS — Z09 Encounter for follow-up examination after completed treatment for conditions other than malignant neoplasm: Secondary | ICD-10-CM

## 2018-12-27 NOTE — Progress Notes (Signed)
Subjective:  Patient ID: David Atkins, male    DOB: 01-09-1929  Age: 85 y.o. MRN: [id number]  CC: Diagnoses of Concussion w loss of consciousness of unsp duration, subs, Hospital discharge follow-up, and Essential hypertension were pertinent to this visit.  HPI David Atkins presents for  hospital follow up.  Patient was admitted on Sunday Jan 12  To  [facility]  .  admitting diagnosis was syncope secondary to dehydration. The event  occurred while bending over to pick up his  newspaper.  He lost balance,  fell backward and struck his head on cement.  The fall was witnessed by wife,  No seizure acitivity,  Was out for a few seconds.  woke up not confused. Marland Kitchen  He was discharged  Home in improved condition on   Tuesday Jan 14   .  No DC summary or progress notes available, other than discussion of  CODE STATUS .   Marland KitchenRecords of hospitalization reviewed and summarized below: Head CT no acute changes Chest x ray   t12 wedge compression,  Chronic  CT abd pelvis,  Severe constipation .  No GB disease,  appx absent  Atherosclerosis of aortoilofemoral arteries.  Sees [facility] regularly for PAD ECHO  EF 65 to 70%   Today he reports rib pain on both sides since the fall. The pain is diffuse and mild.  Using  voltaren every other day.  Rarely uses vicodin (seldom), and he has been having  has a recurrent slight headache on the left temple area, whichis aggravated by reading and working in his shop . Discussed diagnosis of concussion   Taking miralax at night and metamucil in the morning for chronic constipation   Home bps have been  Up to 140/80 or less higher at night .  Takes metoprolol and lisinopril in pm and amlodipine/metoprolol  in the morning      Outpatient Medications Prior to Visit  Medication Sig Dispense Refill  . acetaminophen (TYLENOL) 500 MG tablet Take 500 mg by mouth at bedtime.     Marland Kitchen amLODipine (NORVASC) 2.5 MG tablet Take 1 tablet (2.5 mg total) by mouth daily. (Patient taking  differently: Take 2.5 mg by mouth daily as needed (elevated bp). ) 90 tablet 1  . aspirin 81 MG EC tablet Take 81 mg by mouth daily.     . Blood Pressure Monitoring (BLOOD PRESSURE MONITOR AUTOMAT) DEVI Use daily to check blood pressure 1 Device 0  . Calcium Carbonate-Vitamin D (CALCIUM + D PO) Take 1 tablet by mouth daily.     . Cholecalciferol (VITAMIN D-3) 1000 UNITS CAPS Take 1 capsule by mouth daily.    . diclofenac (VOLTAREN) 75 MG EC tablet Take 1 tablet (75 mg total) by mouth 2 (two) times daily. (Patient taking differently: Take 75 mg by mouth 2 (two) times daily. ) 60 tablet 1  . diclofenac sodium (VOLTAREN) 1 % GEL Apply 4 g topically 4 (four) times daily. (Patient taking differently: Apply 4 g topically 4 (four) times daily as needed (pain). ) 1 Tube 3  . finasteride (PROSCAR) 5 MG tablet TAKE 1 TABLET BY MOUTH  DAILY (Patient taking differently: Take 5 mg by mouth daily. ) 90 tablet 1  . fluocinonide cream (LIDEX) 4.09 % Apply 1 application topically 2 (two) times daily as needed (rash).     Marland Kitchen lisinopril (PRINIVIL,ZESTRIL) 20 MG tablet TAKE 1 TABLET BY MOUTH  DAILY (Patient taking differently: Take 20 mg by mouth daily as needed (elevated  bp). ) 90 tablet 1  . meclizine (ANTIVERT) 12.5 MG tablet TAKE 1 TABLET (12.5 MG TOTAL) BY MOUTH 3 (THREE) TIMES DAILY AS NEEDED. (Patient taking differently: Take 12.5 mg by mouth 3 (three) times daily as needed for dizziness. ) 30 tablet 1  . metoprolol tartrate (LOPRESSOR) 25 MG tablet TAKE ONE-HALF TABLET BY  MOUTH TWO TIMES DAILY (Patient taking differently: Take 12.5 mg by mouth 2 (two) times daily. TAKE ONE-HALF TABLET BY  MOUTH TWO TIMES DAILY) 90 tablet 1  . omeprazole (PRILOSEC) 20 MG capsule TAKE 1 CAPSULE (20 MG TOTAL) BY MOUTH DAILY. (Patient taking differently: Take 20 mg by mouth daily. ) 90 capsule 1  . oxybutynin (DITROPAN) 5 MG tablet Take 1 tablet (5 mg total) by mouth 2 (two) times daily. 60 tablet 11  . polyethylene glycol powder  (GLYCOLAX/MIRALAX) powder Take 17 g by mouth once. Dissolve 1 capful of power into any liquid and drink once daily (Patient taking differently: Take 17 g by mouth at bedtime. Dissolve 1 capful of power into any liquid and drink once daily) 500 g 0  . rOPINIRole (REQUIP) 0.25 MG tablet TAKE 1 TABLET (0.25 MG TOTAL) BY MOUTH AT BEDTIME. INCREASE WEEKLY IF NEEDED 90 tablet 1  . Simethicone Extra Strength 125 MG CAPS Take 125 mg by mouth as needed (gas).    . simvastatin (ZOCOR) 20 MG tablet TAKE 1 TABLET BY MOUTH AT  BEDTIME (Patient taking differently: Take 20 mg by mouth at bedtime. ) 90 tablet 1  . tamsulosin (FLOMAX) 0.4 MG CAPS capsule TAKE ONE CAPSULE BY MOUTH EVERY DAY AFTER BREAKFAST (Patient taking differently: Take 0.4 mg by mouth daily. ) 90 capsule 2  . HYDROcodone-acetaminophen (NORCO) 10-325 MG tablet Take 1 tablet by mouth every 6 (six) hours as needed. (Patient not taking: Reported on 12/27/2018) 30 tablet 0   No facility-administered medications prior to visit.     Review of Systems;  Patient denies, fevers, malaise, unintentional weight loss, skin rash, eye pain, vision changes, sinus congestion and sinus pain, sore throat, dysphagia,  hemoptysis , cough, dyspnea, wheezing, chest pain, palpitations, orthopnea, edema, abdominal pain, nausea, melena, diarrhea, constipation, flank pain, dysuria, hematuria, urinary  Frequency, nocturia, numbness, tingling, seizures,  Focal weakness,   Tremor, insomnia, depression, anxiety, and suicidal ideation.      Objective:  BP (!) 132/58 (BP Location: Left Arm, Patient Position: Sitting, Cuff Size: Normal)   Pulse 61   Temp 97.6 F (36.4 C) (Oral)   Resp 16   Ht 5\' 10"  (1.778 m)   Wt 208 lb (94.3 kg)   SpO2 98%   BMI 29.84 kg/m   BP Readings from Last 3 Encounters:  12/27/18 (!) 132/58  12/17/18 (!) 142/65  11/11/18 138/74    Wt Readings from Last 3 Encounters:  12/27/18 208 lb (94.3 kg)  12/15/18 204 lb (92.5 kg)  11/11/18 205 lb  (93 kg)    General appearance: alert, cooperative and appears stated age Ears: normal TM's and external ear canals both ears Throat: lips, mucosa, and tongue normal; teeth and gums normal Neck: no adenopathy, no carotid bruit, supple, symmetrical, trachea midline and thyroid not enlarged, symmetric, no tenderness/mass/nodules Back: symmetric, no curvature. ROM normal. No CVA tenderness. Lungs: clear to auscultation bilaterally Heart: regular rate and rhythm, S1, S2 normal, no murmur, click, rub or gallop Abdomen: soft, non-tender; bowel sounds normal; no masses,  no organomegaly Pulses: 2+ and symmetric Skin: Skin color, texture, turgor normal. No rashes or lesions  Lymph nodes: Cervical, supraclavicular, and axillary nodes normal. Neuro: CNs 2-12 intact. DTRs 2+/4 in biceps, brachioradialis, patellars and achilles. Muscle strength 5/5 in upper and lower exremities. Fine resting tremor bilaterally both hands cerebellar function normal. Romberg negative.  No pronator drift.   Gait normal.   Lab Results  Component Value Date   HGBA1C 5.7 02/08/2018   HGBA1C 5.6 11/02/2017   HGBA1C 5.8 10/17/2016    Lab Results  Component Value Date   CREATININE 0.86 12/15/2018   CREATININE 1.02 08/13/2018   CREATININE 0.88 02/08/2018    Lab Results  Component Value Date   WBC 8.9 12/15/2018   HGB 15.2 12/15/2018   HCT 45.4 12/15/2018   PLT 207 12/15/2018   GLUCOSE 166 (H) 12/15/2018   CHOL 117 02/08/2018   TRIG 93.0 02/08/2018   HDL 39.10 02/08/2018   LDLDIRECT CANCELED 11/02/2017   LDLCALC 59 02/08/2018   ALT 23 08/13/2018   AST 23 08/13/2018   NA 139 12/15/2018   K 3.6 12/15/2018   CL 106 12/15/2018   CREATININE 0.86 12/15/2018   BUN 18 12/15/2018   CO2 26 12/15/2018   TSH 2.667 12/16/2018   HGBA1C 5.7 02/08/2018   MICROALBUR <0.7 02/08/2018    No results found.  Assessment & Plan:   Problem List Items Addressed This Visit    Concussion w loss of consciousness of unsp  duration, subs    Secondary to fall with blunt head trauma and LOC. Hospitalization records reviewed.  Diagnosis of concussion reviewed with patient, and brain rest advised for two weeks        Hospital discharge follow-up    Patient is stable post discharge and has had all  Issues  and questions about discharge plans addressed at the visit today for hospital follow up. All labs , imaging studies and progress notes from admission were reviewed with patient today        Hypertension    He has been checking his bp several times daily and adjusting his medications daily .  Advised to check bp once daily at the most,  To  take metoprolol and lisinopril and amlodipine on a daily  Basis and suspend  amlodipine only if systolic is < 834         I am having David Atkins maintain his acetaminophen, Calcium Carbonate-Vitamin D (CALCIUM + D PO), Blood Pressure Monitor Automat, Vitamin D-3, polyethylene glycol powder, aspirin, fluocinonide cream, diclofenac sodium, meclizine, omeprazole, oxybutynin, metoprolol tartrate, lisinopril, simvastatin, finasteride, tamsulosin, rOPINIRole, amLODipine, Simethicone Extra Strength, HYDROcodone-acetaminophen, and diclofenac.  No orders of the defined types were placed in this encounter.   There are no discontinued medications.  Follow-up: No follow-ups on file.   [name], MD

## 2018-12-27 NOTE — Patient Instructions (Addendum)
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  I recommend using the amlodipine daily as well and only holding it if the BP is < 100/50  .   Concussion, Adult  A concussion is a brain injury from a direct hit (blow) to the head or body. This blow causes the brain to shake quickly back and forth inside the skull. This can damage brain cells and cause chemical changes in the brain. A concussion may also be known as a mild traumatic brain injury (TBI). Concussions are usually not life-threatening, but the effects of a concussion can be serious. If you have a concussion, you should be very careful to avoid having a second concussion. What are the causes? This condition is caused by:  A direct blow to your head, such as from running into another player during a game, being hit in a fight, or hitting your head on a hard surface.  Sudden movement of your body that causes your brain to move back and forth inside the skull, such as in a car crash. What are the signs or symptoms? The signs of a concussion can be hard to notice. Early on, they may be missed by you, family members, and health care providers. You may look fine on the outside, but may act or feel differently. Symptoms are usually temporary, and for most adults, the symptoms improve in 7-10 days. Some symptoms may appear right away, but other symptoms may not show up for hours or days. If your symptoms last longer than normal, you may have post-concussion syndrome. Every head injury is different. Physical symptoms  Headaches. This can include a feeling of pressure in the head or migraine-like symptoms.  Tiredness (fatigue).  Dizziness.  Problems with coordination or balance.  Vision or hearing problems.  Sensitivity to  light or noise.  Nausea or vomiting.  Changes in eating or sleeping patterns.  Numbness or tingling. Mental and emotional symptoms  Memory problems.  Trouble concentrating, organizing, or making decisions.  Slowness in thinking, acting or reacting, speaking, or reading.  Irritability or mood changes.  Anxiety or depression. How is this diagnosed? This condition is diagnosed based on:  Your symptoms.  A description of your injury. You may also have tests, including:  Imaging tests, such as a CT scan or MRI. These are done to look for signs of brain injury.  Neuropsychological tests. These measure your thinking, understanding, learning, and remembering abilities. How is this treated? Treatment for this condition includes:  Stopping sports or activity if you are injured. Anyone who hits their head or shows signs of a concussion should not return to sports or activities the same day, and they should not return until they are checked by a health care provider.  Physical and mental rest and careful observation, usually at home. Gradually return to your normal activities.  Medicine. You may be prescribed medicines to help with symptoms such as headaches, nausea, or difficulty sleeping. ? Avoid taking opioid pain medicine while recovering from a concussion.  Avoiding alcohol and drugs. Alcohol and certain drugs may slow your recovery and can put you at risk of further injury.  Referral to a concussion clinic or rehabilitation center. How fast you will recover from a concussion depends on many  factors. Recovery can take time. It is important to wait to return to activity until a health care provider says that it is safe and your symptoms are completely gone. Follow these instructions at home: Activity  Limit activities that require a lot of thought or concentration, such as: ? Doing homework or job-related work. ? Watching TV. ? Working on the computer. ? Playing memory games  and puzzles.  Rest. Rest helps your brain heal. Make sure you: ? Get plenty of sleep. Most adults should get 7-9 hours of sleep each night. ? Rest during the day. Take naps or rest breaks when you feel tired.  Do not do high-risk activities that could cause a second concussion, such as riding a bike or playing sports. Having another concussion before the first one has healed can be dangerous.  Ask your health care provider when you can return to your normal activities, such as school, work, athletics, and driving. Your ability to react may be slower after a brain injury. Never do these activities if you are dizzy. Your health care provider will likely give you a plan for gradually returning to activities. General instructions  Take over-the-counter and prescription medicines only as told by your health care provider. Some medicines, such as blood thinners (anticoagulants) and aspirin, may increase the chance of complications, such as bleeding.  Do not drink alcohol until your health care provider says you can.  Watch your symptoms and tell others to do the same. Complications sometimes occur after a concussion. Older adults with a brain injury may have a higher risk of serious complications.  Tell your work Freight forwarder, teachers, Government social research officer, school counselor, coach, or Product/process development scientist about your injury, symptoms, and restrictions. Tell them about what you can or cannot do. They should watch you for worsening symptoms.  Keep all follow-up visits as told by your health care provider. This is important. How is this prevented? Avoiding another brain injury is very important, especially as you recover. In rare cases, another injury can lead to permanent brain damage, brain swelling, or death. The risk of this is greatest during the first 7-10 days after a head injury. Avoid injuries by:  Avoiding activities that could lead to a second concussion, such as contact or recreational sports, until your  health care provider says it is okay.  Taking these actions once you have returned to sports or activities: ? Avoiding plays or moves that can cause you to crash into another person. This is how most concussions occur. ? Following the rules and being respectful of other players. Do not engage in violent or illegal plays.  Getting regular exercise that includes strength and balance training. Wear a properly fitting helmet during sports, biking, or other activities. Helmets can help protect you from serious skull and brain injuries, but they do not protect you from a concussion. Even when wearing a helmet, you should avoid being hit in the head. Contact a health care provider if:  Your symptoms get worse or they do not improve.  You have new symptoms.  You have another injury. Get help right away if:  You have severe or worsening headaches.  You have weakness or numbness in any part of your body.  You are confused.  Your coordination gets worse.  You vomit repeatedly.  You are sleepier than normal.  You have convulsions.  Your speech is slurred.  You cannot recognize people or places.  You have a seizure.  It is difficult to wake you  up.  You have unusual behavior changes.  You have changes in your vision.  You lose consciousness. Summary  A concussion is a brain injury from a direct hit (blow) to your head or body.  You may have imaging tests and neuropsychological tests to diagnose a concussion.  Treatment for this condition includes physical and mental rest and careful observation.  Ask your health care provider when you can return to your normal activities, such as school, work, athletics, and driving. Follow safety instructions as told by your health care provider. This information is not intended to replace advice given to you by your health care provider. Make sure you discuss any questions you have with your health care provider. Document Released:  02/10/2004 Document Revised: 12/27/2017 Document Reviewed: 12/27/2017 Elsevier Interactive Patient Education  2019 [name].

## 2018-12-29 DIAGNOSIS — Z09 Encounter for follow-up examination after completed treatment for conditions other than malignant neoplasm: Secondary | ICD-10-CM | POA: Insufficient documentation

## 2018-12-29 DIAGNOSIS — S060X9D Concussion with loss of consciousness of unspecified duration, subsequent encounter: Secondary | ICD-10-CM

## 2018-12-29 HISTORY — DX: Concussion with loss of consciousness of unspecified duration, subsequent encounter: S06.0X9D

## 2018-12-29 NOTE — Assessment & Plan Note (Signed)
Secondary to fall with blunt head trauma and LOC. Hospitalization records reviewed.  Diagnosis of concussion reviewed with patient, and brain rest advised for two weeks

## 2018-12-29 NOTE — Assessment & Plan Note (Signed)
Patient is stable post discharge and has had all  Issues  and questions about discharge plans addressed at the visit today for hospital follow up. All labs , imaging studies and progress notes from admission were reviewed with patient today

## 2018-12-29 NOTE — Assessment & Plan Note (Addendum)
He has been checking his bp several times daily and adjusting his medications daily .  Advised to check bp once daily at the most,  To  take metoprolol and lisinopril and amlodipine on a daily  Basis and suspend  amlodipine only if systolic is < 938

## 2019-01-01 NOTE — Discharge Summary (Signed)
[facility] NAME: David Atkins    MR#:  [id number]  DATE OF BIRTH:  04/02/1929  DATE OF ADMISSION:  12/15/2018 ADMITTING PHYSICIAN: Harrie Foreman, MD  DATE OF DISCHARGE: 12/17/2018  1:24 PM  PRIMARY CARE PHYSICIAN: [name], MD   ADMISSION DIAGNOSIS:  Syncope, unspecified syncope type [R55]  DISCHARGE DIAGNOSIS:  Active Problems:   Syncope   SECONDARY DIAGNOSIS:   Past Medical History:  Diagnosis Date  . 3-vessel coronary artery disease 2006   [place]  . basal cell   . BPH (benign prostatic hyperplasia)   . GERD (gastroesophageal reflux disease)   . Hyperlipidemia   . Hypertension   . Lumbar stenosis 12/03/13   MRI - lumbar pain with radiation down to LE  . Myocardial infarction ([place]) 2006  . OAB (overactive bladder)   . Osteoporosis 2010   by DEXA at [facility]  . Sleep apnea   . Spondylolisthesis of lumbar region 11/23/13   MRI Lumbar spine     ADMITTING HISTORY  Chief Complaint: Loss of consciousness HPI: The patient with past medical history of CAD status post MI and CABG, hypertension and BPH presents to the emergency department following an episode of syncope.  The patient had eaten dinner and was returning home when he reached down to pick up some mail that was on the floor.  As he stood he seemed to fall backward unconscious and struck his head.  The patient soon regained consciousness.  There was no seizure activity.  However, later he had an episode of pallor and confusion followed by palpitations.  In the emergency department the patient was found to be normotensive without significant laboratory derangements except for concentrated urine.  He denies chest pain or shortness of breath, nausea, vomiting or diarrhea.  However, the patient does admit that he has some bloating and occasional constipation.  HOSPITAL COURSE:   *Syncope Likely vasovagal with quick change in position.  Also contributed by  dehydration.  Admitted to telemetry floor.  Started on IV fluids.  Troponins and EKG showed nothing acute.  No apneas on telemetry.  Echocardiogram with no significant abnormalities found.  Patient did not have any further episodes in the hospital.  Worked with physical therapy.  Outpatient PT with  Other comorbidities remained stable  Discharged home in stable condition to follow-up with primary care physician in a week.  CONSULTS OBTAINED:    DRUG ALLERGIES:   Allergies  Allergen Reactions  . Sulfa Antibiotics Rash    DISCHARGE MEDICATIONS:   Allergies as of 12/17/2018      Reactions   Sulfa Antibiotics Rash      Medication List    TAKE these medications   acetaminophen 500 MG tablet Commonly known as:  TYLENOL Take 500 mg by mouth at bedtime.   amLODipine 2.5 MG tablet Commonly known as:  NORVASC Take 1 tablet (2.5 mg total) by mouth daily. What changed:   when to take this reasons to take this   aspirin 81 MG EC tablet Take 81 mg by mouth daily.   Blood Pressure Monitor Automat Devi Use daily to check blood pressure   CALCIUM + D PO Take 1 tablet by mouth daily.   diclofenac sodium 1 % Gel Commonly known as:  VOLTAREN Apply 4 g topically 4 (four) times daily. What changed:   when to take this reasons to take this   finasteride 5 MG tablet Commonly known as:  PROSCAR  TAKE 1 TABLET BY MOUTH  DAILY   fluocinonide cream 0.05 % Commonly known as:  LIDEX Apply 1 application topically 2 (two) times daily as needed (rash).   lisinopril 20 MG tablet Commonly known as:  PRINIVIL,ZESTRIL TAKE 1 TABLET BY MOUTH  DAILY What changed:   when to take this reasons to take this   meclizine 12.5 MG tablet Commonly known as:  ANTIVERT TAKE 1 TABLET (12.5 MG TOTAL) BY MOUTH 3 (THREE) TIMES DAILY AS NEEDED. What changed:   how much to take how to take this when to take this reasons to take this additional instructions   metoprolol tartrate 25 MG  tablet Commonly known as:  LOPRESSOR TAKE ONE-HALF TABLET BY  MOUTH TWO TIMES DAILY What changed:   how much to take how to take this when to take this   omeprazole 20 MG capsule Commonly known as:  PRILOSEC TAKE 1 CAPSULE (20 MG TOTAL) BY MOUTH DAILY. What changed:  See the new instructions.   oxybutynin 5 MG tablet Commonly known as:  DITROPAN Take 1 tablet (5 mg total) by mouth 2 (two) times daily.   polyethylene glycol powder powder Commonly known as:  GLYCOLAX/MIRALAX Take 17 g by mouth once. Dissolve 1 capful of power into any liquid and drink once daily What changed:  when to take this   rOPINIRole 0.25 MG tablet Commonly known as:  REQUIP TAKE 1 TABLET (0.25 MG TOTAL) BY MOUTH AT BEDTIME. INCREASE WEEKLY IF NEEDED   Simethicone Extra Strength 125 MG Caps Take 125 mg by mouth as needed (gas).   simvastatin 20 MG tablet Commonly known as:  ZOCOR TAKE 1 TABLET BY MOUTH AT  BEDTIME   tamsulosin 0.4 MG Caps capsule Commonly known as:  FLOMAX TAKE ONE CAPSULE BY MOUTH EVERY DAY AFTER BREAKFAST What changed:  See the new instructions.   Vitamin D-3 25 MCG (1000 UT) Caps Take 1 capsule by mouth daily.       Today   VITAL SIGNS:  Blood pressure (!) 142/65, pulse 74, temperature 98.1 F (36.7 C), temperature source Oral, resp. rate (!) 22, height 5\' 10"  (1.778 m), weight 92.5 kg, SpO2 97 %.  I/O:  No intake or output data in the 24 hours ending 01/01/19 1352  PHYSICAL EXAMINATION:  Physical Exam  GENERAL:  85 y.o.-year-old patient lying in the bed with no acute distress.  LUNGS: Normal breath sounds bilaterally, no wheezing, rales,rhonchi or crepitation. No use of accessory muscles of respiration.  CARDIOVASCULAR: S1, S2 normal. No murmurs, rubs, or gallops.  ABDOMEN: Soft, non-tender, non-distended. Bowel sounds present. No organomegaly or mass.  NEUROLOGIC: Moves all 4 extremities. PSYCHIATRIC: The patient is alert and oriented x 3.  SKIN: No obvious  rash, lesion, or ulcer.   DATA REVIEW:   CBC No results for input(s): WBC, HGB, HCT, PLT in the last 168 hours.  Chemistries  No results for input(s): NA, K, CL, CO2, GLUCOSE, BUN, CREATININE, CALCIUM, MG, AST, ALT, ALKPHOS, BILITOT in the last 168 hours.  Invalid input(s): GFRCGP  Cardiac Enzymes No results for input(s): TROPONINI in the last 168 hours.  Microbiology Results  Results for orders placed or performed in visit on 08/22/18  Urine Culture     Status: None   Collection Time: 08/22/18  2:50 PM  Result Value Ref Range Status   MICRO NUMBER: [id number]  Final   SPECIMEN QUALITY: ADEQUATE  Final   Sample Source URINE  Final   STATUS: FINAL  Final  ISOLATE 1:   Final    Three or more organisms present, each greater than 10,000 cu/mL. May represent normal flora contamination from external genitalia. No further testing is required.    RADIOLOGY:  No results found.  Follow up with PCP in 1 week.  Management plans discussed with the patient, family and they are in agreement.  CODE STATUS:  Code Status History    Date Active Date Inactive Code Status Order ID Comments User Context   12/16/2018 1153 12/17/2018 1632 Full Code [id number]  Harrie Foreman, MD ED    Advance Directive Documentation     Most Recent Value  Type of Advance Directive  Living will  Pre-existing out of facility DNR order (yellow form or pink MOST form)  -  "MOST" Form in Place?  -      TOTAL TIME TAKING CARE OF THIS PATIENT ON DAY OF DISCHARGE: more than 30 minutes.   Leia Alf Florencio Hollibaugh M.D on 01/01/2019 at 1:52 PM  Between 7am to 6pm - Pager - [phone]  After 6pm go to [email or web address] - password [email or web address] [facility]  Office  [phone]  CC: Primary care physician; [name], MD  Note: This dictation was prepared with Dragon dictation along with smaller phrase technology. Any transcriptional errors that result from this process are unintentional.

## 2019-01-02 ENCOUNTER — Other Ambulatory Visit: Payer: Self-pay | Admitting: Internal Medicine

## 2019-01-02 ENCOUNTER — Other Ambulatory Visit: Payer: Self-pay | Admitting: Family

## 2019-01-02 DIAGNOSIS — M25571 Pain in right ankle and joints of right foot: Principal | ICD-10-CM

## 2019-01-02 DIAGNOSIS — G8929 Other chronic pain: Secondary | ICD-10-CM

## 2019-01-02 DIAGNOSIS — M25572 Pain in left ankle and joints of left foot: Principal | ICD-10-CM

## 2019-01-14 ENCOUNTER — Other Ambulatory Visit: Payer: Self-pay | Admitting: Internal Medicine

## 2019-01-14 DIAGNOSIS — D0462 Carcinoma in situ of skin of left upper limb, including shoulder: Secondary | ICD-10-CM | POA: Diagnosis not present

## 2019-01-14 DIAGNOSIS — X32XXXA Exposure to sunlight, initial encounter: Secondary | ICD-10-CM | POA: Diagnosis not present

## 2019-01-14 DIAGNOSIS — Z08 Encounter for follow-up examination after completed treatment for malignant neoplasm: Secondary | ICD-10-CM | POA: Diagnosis not present

## 2019-01-14 DIAGNOSIS — L570 Actinic keratosis: Secondary | ICD-10-CM | POA: Diagnosis not present

## 2019-01-14 DIAGNOSIS — Z85828 Personal history of other malignant neoplasm of skin: Secondary | ICD-10-CM | POA: Diagnosis not present

## 2019-01-14 DIAGNOSIS — D485 Neoplasm of uncertain behavior of skin: Secondary | ICD-10-CM | POA: Diagnosis not present

## 2019-02-05 ENCOUNTER — Other Ambulatory Visit: Payer: Self-pay | Admitting: Internal Medicine

## 2019-02-17 ENCOUNTER — Other Ambulatory Visit: Payer: Self-pay | Admitting: Internal Medicine

## 2019-02-17 NOTE — Telephone Encounter (Signed)
Copied from [place] [id number]. Topic: Quick Communication - Rx Refill/Question >> Feb 17, 2019  4:21 PM Alanda Slim E wrote: Medication: amLODipine (NORVASC) 2.5 MG tablet  oxybutynin (DITROPAN) 5 MG tablet rOPINIRole (REQUIP) 0.25 MG tablet  tamsulosin (FLOMAX) 0.4 MG CAPS capsule - needs sooner than the others   Has the patient contacted their pharmacy? Yes - needed these to go [facility] for now   Preferred Pharmacy (with phone number or street name): [facility], [place] [phone] (Phone) [phone] (Fax)    Agent: Please be advised that RX refills may take up to 3 business days. We ask that you follow-up with your pharmacy.

## 2019-02-18 NOTE — Telephone Encounter (Signed)
Clarification needed on the sig for Norvasc as patient has reported to pharmacy taking it differently. / Ditropan requested but previous prescribed by another provider.

## 2019-02-24 MED ORDER — ROPINIROLE HCL 0.25 MG PO TABS: 0.2500 mg | ORAL_TABLET | Freq: Every day | ORAL | 1 refills | Status: DC

## 2019-02-24 MED ORDER — OXYBUTYNIN CHLORIDE 5 MG PO TABS: 5.0000 mg | ORAL_TABLET | Freq: Two times a day (BID) | ORAL | 1 refills | Status: DC

## 2019-02-24 MED ORDER — TAMSULOSIN HCL 0.4 MG PO CAPS: ORAL_CAPSULE | ORAL | 1 refills | Status: DC

## 2019-02-24 MED ORDER — AMLODIPINE BESYLATE 2.5 MG PO TABS: 2.5000 mg | ORAL_TABLET | Freq: Every day | ORAL | 1 refills | Status: DC

## 2019-02-24 NOTE — Telephone Encounter (Signed)
Looks like the oxybutin was last refilled by a doctor at [place].

## 2019-03-05 ENCOUNTER — Other Ambulatory Visit: Payer: Self-pay | Admitting: Internal Medicine

## 2019-04-04 ENCOUNTER — Other Ambulatory Visit: Payer: Self-pay | Admitting: Internal Medicine

## 2019-04-04 DIAGNOSIS — M25571 Pain in right ankle and joints of right foot: Principal | ICD-10-CM

## 2019-04-04 DIAGNOSIS — M25572 Pain in left ankle and joints of left foot: Principal | ICD-10-CM

## 2019-04-04 DIAGNOSIS — G8929 Other chronic pain: Secondary | ICD-10-CM

## 2019-05-02 DIAGNOSIS — R350 Frequency of micturition: Secondary | ICD-10-CM | POA: Diagnosis not present

## 2019-05-17 ENCOUNTER — Other Ambulatory Visit: Payer: Self-pay | Admitting: Internal Medicine

## 2019-06-05 ENCOUNTER — Other Ambulatory Visit: Payer: Self-pay | Admitting: Internal Medicine

## 2019-06-24 ENCOUNTER — Other Ambulatory Visit: Payer: Self-pay

## 2019-06-24 NOTE — Patient Outreach (Signed)
[facility]) Care Management  06/24/2019  David Atkins 1929-08-16 [id number]   Medication Adherence call to David Atkins HIPPA Compliant Voice message left with a call back number. David Atkins is showing past due on Lisinopril 20 mg under [place].   [place] Management Direct Dial [phone]  Fax [phone] [name]

## 2019-06-27 ENCOUNTER — Ambulatory Visit: Payer: Medicare Other | Admitting: Internal Medicine

## 2019-06-30 ENCOUNTER — Ambulatory Visit: Payer: Medicare Other | Admitting: Internal Medicine

## 2019-07-02 DIAGNOSIS — R1314 Dysphagia, pharyngoesophageal phase: Secondary | ICD-10-CM | POA: Diagnosis not present

## 2019-07-02 DIAGNOSIS — H6123 Impacted cerumen, bilateral: Secondary | ICD-10-CM | POA: Diagnosis not present

## 2019-07-02 DIAGNOSIS — J300 Vasomotor rhinitis: Secondary | ICD-10-CM | POA: Diagnosis not present

## 2019-07-07 ENCOUNTER — Other Ambulatory Visit: Payer: Self-pay

## 2019-07-07 ENCOUNTER — Encounter: Payer: Self-pay | Admitting: Internal Medicine

## 2019-07-07 ENCOUNTER — Ambulatory Visit (INDEPENDENT_AMBULATORY_CARE_PROVIDER_SITE_OTHER): Payer: Medicare Other | Admitting: Internal Medicine

## 2019-07-07 DIAGNOSIS — R350 Frequency of micturition: Secondary | ICD-10-CM

## 2019-07-07 DIAGNOSIS — E785 Hyperlipidemia, unspecified: Secondary | ICD-10-CM | POA: Diagnosis not present

## 2019-07-07 DIAGNOSIS — R1310 Dysphagia, unspecified: Secondary | ICD-10-CM | POA: Insufficient documentation

## 2019-07-07 DIAGNOSIS — N401 Enlarged prostate with lower urinary tract symptoms: Secondary | ICD-10-CM | POA: Diagnosis not present

## 2019-07-07 DIAGNOSIS — R1314 Dysphagia, pharyngoesophageal phase: Secondary | ICD-10-CM | POA: Diagnosis not present

## 2019-07-07 NOTE — Progress Notes (Signed)
Telephone Note   This visit type was conducted due to national recommendations for restrictions regarding the COVID-19 pandemic (e.g. social distancing).  This format is felt to be most appropriate for this patient at this time.  All issues noted in this document were discussed and addressed.  No physical exam was performed (except for noted visual exam findings with Video Visits).   I connected with@ on 07/07/19 at  4:00 PM EDT by  telephone and verified that I am speaking with the correct person using two identifiers.  Location patient: home Location provider: work or home office Persons participating in the virtual visit: patient, provider  I discussed the limitations, risks, security and privacy concerns of performing an evaluation and management service by telephone and the availability of in person appointments. I also discussed with the patient that there may be a patient responsible charge related to this service. The patient expressed understanding and agreed to proceed.  Reason for visit: follow up  HPI:  Hypertension: patient checks blood pressure twice weekly at home.  Readings have been for the most part < 140/80 at rest . Patient is following a reduce salt diet most days and is taking medications as prescribed  He is sleeping well,  appetite is good and is tolerating his medications.   Has been having dysphagia for solids and liquids,  ENT has ordered  barium swallow evaluation   Walking daily for about 20 minutes. No recent falls.  Denies pain . Bowels moving regularly  ROS: See pertinent positives and negatives per HPI.  Past Medical History:  Diagnosis Date  . 3-vessel coronary artery disease 2006   [place]  . basal cell   . BPH (benign prostatic hyperplasia)   . GERD (gastroesophageal reflux disease)   . Hyperlipidemia   . Hypertension   . Lumbar stenosis 12/03/13   MRI - lumbar pain with radiation down to LE  . Myocardial infarction ([place]) 2006  . OAB  (overactive bladder)   . Osteoporosis 2010   by DEXA at [facility]  . Sleep apnea   . Spondylolisthesis of lumbar region 11/23/13   MRI Lumbar spine    Past Surgical History:  Procedure Laterality Date  . APPENDECTOMY  1951  . COLONOSCOPY    . COLONOSCOPY WITH PROPOFOL N/A 11/15/2015   Procedure: COLONOSCOPY WITH PROPOFOL;  Surgeon: Hulen Luster, MD;  Location: [facility] ENDOSCOPY;  Service: Gastroenterology;  Laterality: N/A;  . CORONARY ARTERY BYPASS GRAFT  2006   3 vessel, s/p AMI  . NASAL SEPTOPLASTY W/ TURBINOPLASTY  1981  . TONSILLECTOMY      Family History  Problem Relation Age of Onset  . Hyperlipidemia Mother   . Hypertension Mother   . Hypertension Father      SOCIAL HX:  reports that he quit smoking about 56 years ago. His smoking use included cigarettes. He has never used smokeless tobacco. He reports that he does not drink alcohol or use drugs.  Current Outpatient Medications:  .  acetaminophen (TYLENOL) 500 MG tablet, Take 500 mg by mouth at bedtime. , Disp: , Rfl:  .  amLODipine (NORVASC) 2.5 MG tablet, Take 1 tablet (2.5 mg total) by mouth daily., Disp: 90 tablet, Rfl: 1 .  aspirin 81 MG EC tablet, Take 81 mg by mouth daily. , Disp: , Rfl:  .  Blood Pressure Monitoring (BLOOD PRESSURE MONITOR AUTOMAT) DEVI, Use daily to check blood pressure, Disp: 1 Device, Rfl: 0 .  Calcium Carbonate-Vitamin D (CALCIUM + D PO), Take  1 tablet by mouth daily. , Disp: , Rfl:  .  Cholecalciferol (VITAMIN D-3) 1000 UNITS CAPS, Take 1 capsule by mouth daily., Disp: , Rfl:  .  finasteride (PROSCAR) 5 MG tablet, Take 1 tablet (5 mg total) by mouth daily., Disp: 90 tablet, Rfl: 1 .  ipratropium (ATROVENT) 0.03 % nasal spray, , Disp: , Rfl:  .  lisinopril (PRINIVIL,ZESTRIL) 20 MG tablet, TAKE 1 TABLET BY MOUTH  DAILY, Disp: 90 tablet, Rfl: 1 .  meclizine (ANTIVERT) 12.5 MG tablet, TAKE 1 TABLET (12.5 MG TOTAL) BY MOUTH 3 (THREE) TIMES DAILY AS NEEDED. (Patient taking differently: Take 12.5 mg by mouth  3 (three) times daily as needed for dizziness. ), Disp: 30 tablet, Rfl: 1 .  metoprolol tartrate (LOPRESSOR) 25 MG tablet, TAKE ONE-HALF TABLET BY  MOUTH TWO TIMES DAILY, Disp: 90 tablet, Rfl: 1 .  omeprazole (PRILOSEC) 20 MG capsule, TAKE 1 CAPSULE BY MOUTH  DAILY, Disp: 90 capsule, Rfl: 1 .  oxybutynin (DITROPAN) 5 MG tablet, Take 1 tablet (5 mg total) by mouth 2 (two) times daily., Disp: 180 tablet, Rfl: 1 .  polyethylene glycol powder (GLYCOLAX/MIRALAX) powder, Take 17 g by mouth once. Dissolve 1 capful of power into any liquid and drink once daily (Patient taking differently: Take 17 g by mouth at bedtime. Dissolve 1 capful of power into any liquid and drink once daily), Disp: 500 g, Rfl: 0 .  rOPINIRole (REQUIP) 0.25 MG tablet, Take 1 tablet (0.25 mg total) by mouth at bedtime. Increase weekly if needed, Disp: 90 tablet, Rfl: 1 .  Simethicone Extra Strength 125 MG CAPS, Take 125 mg by mouth as needed (gas)., Disp: , Rfl:  .  simvastatin (ZOCOR) 20 MG tablet, TAKE 1 TABLET BY MOUTH AT  BEDTIME, Disp: 90 tablet, Rfl: 1 .  tamsulosin (FLOMAX) 0.4 MG CAPS capsule, TAKE ONE CAPSULE BY MOUTH EVERY DAY AFTER BREAKFAST, Disp: 90 capsule, Rfl: 2 .  diclofenac (VOLTAREN) 75 MG EC tablet, Take 1 tablet (75 mg total) by mouth 2 (two) times daily. (Patient not taking: Reported on 07/07/2019), Disp: 60 tablet, Rfl: 1 .  diclofenac sodium (VOLTAREN) 1 % GEL, APPLY 4 GRAMS TOPICALLY 4  (FOUR) TIMES DAILY. (Patient not taking: Reported on 07/07/2019), Disp: 400 g, Rfl: 0  EXAM:  VITALS per patient if applicable:  GENERAL: alert, oriented, appears well and in no acute distress  HEENT: atraumatic, conjunttiva clear, no obvious abnormalities on inspection of external nose and ears  NECK: normal movements of the head and neck  LUNGS: on inspection no signs of respiratory distress, breathing rate appears normal, no obvious gross SOB, gasping or wheezing  CV: no obvious cyanosis  MS: moves all visible  extremities without noticeable abnormality  PSYCH/NEURO: pleasant and cooperative, no obvious depression or anxiety, speech and thought processing grossly intact  ASSESSMENT AND PLAN:  Dysphagia For solids and liquids  Has seen ENT.  Vaught,  Swallow eval ordered   BPH (benign prostatic hyperplasia) Managed with finasteride and tamsulosin.  He has been having periodic  intermittent episodes of nocturia.   No changes today    Hyperlipidemia LDL goal <70 With CAD, CVD  and PAD.  LDL is at goal on statin  And LFTS are normal.   Lab Results  Component Value Date   CHOL 117 02/08/2018   HDL 39.10 02/08/2018   LDLCALC 59 02/08/2018   LDLDIRECT CANCELED 11/02/2017   TRIG 93.0 02/08/2018   CHOLHDL 3 02/08/2018   Lab Results  Component Value  Date   ALT 23 08/13/2018   AST 23 08/13/2018   ALKPHOS 57 08/13/2018   BILITOT 0.5 08/13/2018       I discussed the assessment and treatment plan with the patient. The patient was provided an opportunity to ask questions and all were answered. The patient agreed with the plan and demonstrated an understanding of the instructions.   The patient was advised to call back or seek an in-person evaluation if the symptoms worsen or if the condition fails to improve as anticipated.  I provided 25 minutes of non-face-to-face time during this encounter.   [name], MD

## 2019-07-07 NOTE — Assessment & Plan Note (Signed)
For solids and liquids  Has seen ENT.  Vaught,  Swallow eval ordered

## 2019-07-08 ENCOUNTER — Other Ambulatory Visit: Payer: Self-pay | Admitting: Otolaryngology

## 2019-07-08 DIAGNOSIS — R1310 Dysphagia, unspecified: Secondary | ICD-10-CM

## 2019-07-08 NOTE — Assessment & Plan Note (Addendum)
Managed with finasteride and tamsulosin.  He has been having periodic  intermittent episodes of nocturia.   No changes today

## 2019-07-08 NOTE — Assessment & Plan Note (Signed)
With CAD, CVD  and PAD.  LDL is at goal on statin  And LFTS are normal.   Lab Results  Component Value Date   CHOL 117 02/08/2018   HDL 39.10 02/08/2018   LDLCALC 59 02/08/2018   LDLDIRECT CANCELED 11/02/2017   TRIG 93.0 02/08/2018   CHOLHDL 3 02/08/2018   Lab Results  Component Value Date   ALT 23 08/13/2018   AST 23 08/13/2018   ALKPHOS 57 08/13/2018   BILITOT 0.5 08/13/2018

## 2019-08-04 ENCOUNTER — Ambulatory Visit (INDEPENDENT_AMBULATORY_CARE_PROVIDER_SITE_OTHER): Payer: Medicare Other | Admitting: Family Medicine

## 2019-08-04 ENCOUNTER — Other Ambulatory Visit: Payer: Self-pay

## 2019-08-04 ENCOUNTER — Ambulatory Visit (INDEPENDENT_AMBULATORY_CARE_PROVIDER_SITE_OTHER): Payer: Medicare Other

## 2019-08-04 VITALS — BP 136/72 | HR 63 | Temp 96.4°F | Resp 18 | Ht 70.0 in | Wt 201.2 lb

## 2019-08-04 DIAGNOSIS — Z23 Encounter for immunization: Secondary | ICD-10-CM | POA: Diagnosis not present

## 2019-08-04 DIAGNOSIS — S5002XA Contusion of left elbow, initial encounter: Secondary | ICD-10-CM

## 2019-08-04 DIAGNOSIS — L089 Local infection of the skin and subcutaneous tissue, unspecified: Secondary | ICD-10-CM | POA: Diagnosis not present

## 2019-08-04 DIAGNOSIS — S59902A Unspecified injury of left elbow, initial encounter: Secondary | ICD-10-CM | POA: Diagnosis not present

## 2019-08-04 DIAGNOSIS — M7989 Other specified soft tissue disorders: Secondary | ICD-10-CM | POA: Diagnosis not present

## 2019-08-04 DIAGNOSIS — M25522 Pain in left elbow: Secondary | ICD-10-CM | POA: Diagnosis not present

## 2019-08-04 DIAGNOSIS — T148XXA Other injury of unspecified body region, initial encounter: Secondary | ICD-10-CM

## 2019-08-04 MED ORDER — CEPHALEXIN 500 MG PO CAPS: 500.0000 mg | ORAL_CAPSULE | Freq: Four times a day (QID) | ORAL | 0 refills | Status: DC

## 2019-08-04 NOTE — Progress Notes (Signed)
Subjective:    Patient ID: David Atkins, male    DOB: 10-Mar-1929, 85 y.o.   MRN: [id number]  HPI   Patient presents to clinic due to swollen left elbow after hitting elbow on a wall about 10 days ago.  States he fell to the side scraping left elbow on a wall, caused skin abrasion.  He and his daughter have done a good job of keeping skin clean and putting ointment and Band-Aids over the 2 abrasions.  Daughter noticed his elbow with a little swollen and some of the skin on upper left forearm also looks swollen and a little pink in color.  She became concerned.  No fever or chills.  Open areas of skin are not draining.  Patient Active Problem List   Diagnosis Date Noted  . Dysphagia 07/07/2019  . Concussion w loss of consciousness of unsp duration, subs 12/29/2018  . Hospital discharge follow-up 12/29/2018  . Syncope 12/16/2018  . Dizziness and giddiness 10/01/2018  . PAD (peripheral artery disease) ([place]) 10/01/2018  . Cerebrovascular disease 02/07/2018  . CAD (coronary artery disease), native coronary artery 11/04/2017  . Vertigo 10/11/2017  . Right foot pain 08/31/2017  . Edema of right lower extremity 07/18/2017  . RLS (restless legs syndrome) 07/18/2017  . Hx of CABG 09/21/2016  . IBS (irritable bowel syndrome) 11/03/2015  . Generalized anxiety disorder 01/20/2015  . Overweight 05/24/2014  . Unspecified vitamin D deficiency 05/22/2014  . OSA on CPAP 01/19/2014  . Lumbar pain with radiation down left leg 12/01/2013  . Contact eczema 10/15/2013  . Encounter for preventive health examination 01/13/2013  . GERD (gastroesophageal reflux disease) 01/06/2013  . Bilateral carotid artery stenosis 12/07/2012  . Hyperlipidemia LDL goal <70 07/09/2012  . Impaired glucose tolerance 07/09/2012  . Basal cell carcinoma 07/09/2012  . BPPV (benign paroxysmal positional vertigo) 07/09/2012  . Osteoporosis   . BPH (benign prostatic hyperplasia)   . Hypertension    Social History   Tobacco  Use  . Smoking status: Former Smoker    Types: Cigarettes    Quit date: 07/10/1963    Years since quitting: 56.1  . Smokeless tobacco: Never Used  Substance Use Topics  . Alcohol use: No   Review of Systems  Constitutional: Negative for chills, fatigue and fever.  HENT: Negative for congestion, ear pain, sinus pain and sore throat.   Eyes: Negative.   Respiratory: Negative for cough, shortness of breath and wheezing.   Cardiovascular: Negative for chest pain, palpitations and leg swelling.  Gastrointestinal: Negative for abdominal pain, diarrhea, nausea and vomiting.  Genitourinary: Negative for dysuria, frequency and urgency.  Musculoskeletal: +swollen elbow, left Skin: +skin abrasion left elbow Neurological: Negative for syncope, light-headedness and headaches.  Psychiatric/Behavioral: The patient is not nervous/anxious.       Objective:   Physical Exam Vitals signs and nursing note reviewed.  Constitutional:      General: He is not in acute distress.    Appearance: He is not ill-appearing, toxic-appearing or diaphoretic.  HENT:     Head: Normocephalic and atraumatic.  Cardiovascular:     Rate and Rhythm: Normal rate.  Pulmonary:     Effort: Pulmonary effort is normal. No respiratory distress.  Musculoskeletal:       Arms:     Comments: Olecranon bursa of left elbow is a little puffy does not feel hot to the touch however surrounding skin is pink.  2 small abrasions represented by blue mark on diagram, one is on the other  is on the left forearm.  Open areas cleansed with alcohol swabs, bacitracin ointment and Band-Aids applied. Skin left forearm is also pink and does appear a little swollen.  Able to straighten and bend left arm at elbow, able to move left wrist, able to make a tight fist and flex at all fingers without problems.  Skin:    General: Skin is warm and dry.     Coloration: Skin is not jaundiced or pale.  Neurological:     Mental Status: He is alert.      Comments: Walks with cane  Psychiatric:        Mood and Affect: Mood normal.        Behavior: Behavior normal.       Today's Vitals   08/04/19 1422  BP: 136/72  Pulse: 63  Resp: 18  Temp: (!) 96.4 F (35.8 C)  TempSrc: Temporal  SpO2: 94%  Weight: 201 lb 3.2 oz (91.3 kg)  Height: 5\' 10"  (1.778 m)   Body mass index is 28.87 kg/m.     Assessment & Plan:   Contusion of left elbow/skin abrasion with infection - suspect patient caused an olecranon bursitis due to hitting her left elbow on the wall.  We will get x-ray to rule out fracture.  Also I believe the skin abrasions caused an opening in the skin leading to a cellulitis infection, we will treat with Keflex 4 times daily for 10 days and patient and daughter advised to cleanse skin with soap and water daily, pat dry, apply thin layer of ointment and cover with Band-Aid.  Also advised that he can wrap the elbow and forearm with an Ace bandage to help provide some compression to the area to reduce swelling. Also advised can use tylenol PRN for pain and do gentle ROM with elbow, wrist, fingers.   Patient and daughter aware that we will call them with results of x-ray.  Also advised to monitor for any worsening redness, swelling, development of drainage from open areas and to call office right away if the skin it does not heal as expected.  Daughter and patient verbalized understanding.  He will otherwise keep all regular follow-ups with PCP as planned and return to clinic sooner if any issues arise.  Flu vaccine given in clinic today

## 2019-08-05 ENCOUNTER — Encounter: Payer: Self-pay | Admitting: Family Medicine

## 2019-08-21 ENCOUNTER — Other Ambulatory Visit: Payer: Self-pay

## 2019-08-21 ENCOUNTER — Ambulatory Visit
Admission: RE | Admit: 2019-08-21 | Discharge: 2019-08-21 | Disposition: A | Discharge status: Home / Self Care | Payer: Medicare Other | Source: Ambulatory Visit | Attending: Otolaryngology | Admitting: Otolaryngology

## 2019-08-21 DIAGNOSIS — R1310 Dysphagia, unspecified: Secondary | ICD-10-CM | POA: Diagnosis not present

## 2019-08-21 DIAGNOSIS — R05 Cough: Secondary | ICD-10-CM | POA: Diagnosis not present

## 2019-08-21 DIAGNOSIS — R1314 Dysphagia, pharyngoesophageal phase: Secondary | ICD-10-CM

## 2019-08-21 NOTE — Therapy (Addendum)
[address] [place], [place], [address] Phone: [phone]   Fax:     Modified Barium Swallow  Patient Details  Name: David Atkins MRN: [id number] Date of Birth: 1929/05/17 No data recorded  Encounter Date: 08/21/2019  End of Session - 08/21/19 1504    Visit Number  1    Number of Visits  1    Date for SLP Re-Evaluation  08/21/19    SLP Start Time  19    SLP Stop Time   1400    SLP Time Calculation (min)  60 min    Activity Tolerance  Patient tolerated treatment well       Past Medical History:  Diagnosis Date  . 3-vessel coronary artery disease 2006   [place]  . basal cell   . BPH (benign prostatic hyperplasia)   . GERD (gastroesophageal reflux disease)   . Hyperlipidemia   . Hypertension   . Lumbar stenosis 12/03/13   MRI - lumbar pain with radiation down to LE  . Myocardial infarction ([place]) 2006  . OAB (overactive bladder)   . Osteoporosis 2010   by DEXA at [facility]  . Sleep apnea   . Spondylolisthesis of lumbar region 11/23/13   MRI Lumbar spine    Past Surgical History:  Procedure Laterality Date  . APPENDECTOMY  1951  . COLONOSCOPY    . COLONOSCOPY WITH PROPOFOL N/A 11/15/2015   Procedure: COLONOSCOPY WITH PROPOFOL;  Surgeon: Hulen Luster, MD;  Location: [facility] ENDOSCOPY;  Service: Gastroenterology;  Laterality: N/A;  . CORONARY ARTERY BYPASS GRAFT  2006   3 vessel, s/p AMI  . NASAL SEPTOPLASTY W/ TURBINOPLASTY  1981  . TONSILLECTOMY      There were no vitals filed for this visit.       Subjective: Patient behavior: (alertness, ability to follow instructions, etc.): pt alert, pleasant. He verbally engaged w/ SLP revealing s/s of his dysphagia. Pt followed instructions appropriately. He denied any Neurological history; he denied any recent dx of Pneumonia in past 1 yr. Of note, pt's vocal quality was gravely and wet at baseline secondary to native saliva/mucous --- frequent throat  clearing noted during discussion. (Noted suspected cause later on during the Study - prominent CP muscle) Chief complaint: dysphagia. Pt revealed baseline h/o GERD (on PPI 1x daily but unsure of dosage). He stated the GERD and his difficulty swallowing "has gone on for years now". He endorsed min more difficulty w/ solids vs liquids; sometimes w/ Pills recently. He denied having any Esophageal dilation procedures in past. OM exam WFL; Dentures.    Objective:  Radiological Procedure: A videoflouroscopic evaluation of oral-preparatory, reflex initiation, and pharyngeal phases of the swallow was performed; as well as a screening of the upper esophageal phase.  I. POSTURE: upright II. VIEW: lateral III. COMPENSATORY STRATEGIES: f/u, DRY swallows to aid clearing pharyngeal-esophageal residue IV. BOLUSES ADMINISTERED:  Thin Liquid: 6 trials  Nectar-thick Liquid: 2 trials  Honey-thick Liquid: NT  Puree: 3 trials  Mechanical Soft: 2 trials V. RESULTS OF EVALUATION: A. ORAL PREPARATORY PHASE: (The lips, tongue, and velum are observed for strength and coordination)       **Overall Severity Rating: WFL. Pt exhibited timely bolus management/mastication, A-P transfer of trials, and fully oral clearing b/t trials.  B. SWALLOW INITIATION/REFLEX: (The reflex is normal if "triggered" by the time the bolus reached the base of the tongue)  **Overall Severity Rating: grossly WFL. Fairly timely pharyngeal swallow initiation noted w/ all  trials consistencies at the level of Valleculae. Adequate airway closure (during the swallow) w/ no aspiration or penetration noted during the swallow.   C. PHARYNGEAL PHASE: (Pharyngeal function is normal if the bolus shows rapid, smooth, and continuous transit through the pharynx and there is no pharyngeal residue after the swallow)  **Overall Severity Rating: MILD-MODERATE. Moderate pharyngeal residue remained throughout the pharynx post initial swallow. Noted trace laryngeal  Penetration of thin liquids After the swallow which appeared d/t the increased pharyngeal residue penetrating into the laryngeal vestibule. This was not consistent; the penetrated material did NOT increase amount. Pt exhibited a throat clearing during this episode; as well as often during the study --- suspect d/ the increased pharyngeal residue. (Pt stated this throat clearing behavior was "normal" for him during and after meals.) Pt utilized the strategy of f/u, DRY swallows to reduce/clear the pharyngeal residue --- this repeated swallowing appeared to clear the trace penetration of liquid material. Strongly suspect the pharyngeal residue is largely attributed to the Moderate+ degree of Esophageal dysmotility and the Retrograde bolus activity occurring during ALL trial consistencies given/swallowed --- consistent occurrence of bolus material moving from the Cervical Esophagus superiorly back into the Pyriform Sinuses secondary to the Prominent cricopharyngeus muscle, as noted per Radiologist also.  D. LARYNGEAL PENETRATION: (Material entering into the laryngeal inlet/vestibule but not aspirated): trace, inconsistent - appeared secondary to pharyngeal residue  E. ASPIRATION: NONE F. ESOPHAGEAL PHASE: (Screening of the upper esophagus): apparent Prominent cricopharyngeus muscle/bulging of tissue along posterior Cervical Esophageal wall narrowing the path of the Esophagus and restricting bolus flow/motility. Noted consistent Retrograde activity of bolus material from the Esophagus to the Pyriform Sinuses w/ each trial consistency given.    ASSESSMENT: Pt appears to present w/ an overall functional oropharyngeal swallow w/ consistencies assessed at this evaluation. 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 Pt was able to  utilize a DRY swallow strategy after each po trial to reduce this pharyngo-esophageal residue. With the degree of impact from the Prominent Cricopharyngeus Muscle(as noted per Radiologist also), the Esophageal phase dysmotility and Retrograde activity of food/liquid material back into the pharynx can increase risk for aspiration of the Backflow, food/liquid material, which can in turn impact Pulmonary status, and oral intake.  During the oral phase, pt exhibited timely bolus management/mastication, A-P transfer of trials, and full oral clearing b/t trials. During the pharyngeal phase, fairly timely pharyngeal swallow initiation noted w/ all trials consistencies at the level of Valleculae. Adequate airway closure (During the swallow) w/ No aspiration or penetration noted During the swallow. Adequate laryngeal excursion and BOT contact/pharyngeal pressure noted during the initial swallows. However, MOST noted was the Moderate pharyngeal residue remaining throughout the pharynx post initial swallows of trials. Noted trace laryngeal Penetration of thin liquids After the swallow, and during f/u, Dry swallows, which appeared d/t the Moderate amount of pharyngeal residue penetrating into the laryngeal vestibule. This penetrating behavior was NOT consistent; the penetrated material did NOT increase amount. Pt exhibited a throat clearing during the episode; as well as often during the study --- suspect d/ the increased stimulation/presence of the pharyngeal residue. (Pt stated this throat clearing behavior was "normal" for him during and after meals.) Pt utilized the strategy of f/u, DRY swallows to reduce/clear the pharyngeal residue --- this repeated swallowing appeared to clear the trace penetration of liquid and/or bolus material  as well. Strongly suspect/feel the pharyngeal residue is largely attributed to the Moderate+ degree of Esophageal dysmotility and the fairly frequent RETROGRADE bolus activity occurring during  ALL trial consistencies given/swallowed --- consistent occurrence of bolus material moving from the Cervical Esophagus superiorly back into the Pyriform Sinuses secondary to the Prominent Cricopharyngeus Muscle, as noted per Radiologist also. Pt fed self independently.   PLAN/RECOMMENDATIONS:  A. Diet: Mech Soft diet(even consider minced meats, gravy added) w/ thin liquids; Pills in Puree - broken down; small. Consider crushing and/or liquid forms of medications if necessary for easier swallowing  B. Swallowing Precautions: aspiration precautions w/ swallow strategy of f/u, DRY swallows between [facility] bite and sip  C. Recommended consultation to: ENT, GI for further assessment and management options of the Prominent Cricopharyngeus Muscle; and pt's REFLUX to include discussion of PPI.   D. Therapy recommendations: None at this time  E. Results and recommendations were discussed w/ pt, video viewed and questions answered. Education on aspiration precautions discussed and swallow strategies practiced during exam.            Dysphagia, pharyngoesophageal phase  Dysphagia, unspecified type - Plan: DG SWALLOW FUNC OP MEDICARE SPEECH PATH, DG SWALLOW FUNC OP MEDICARE SPEECH PATH        Problem List Patient Active Problem List   Diagnosis Date Noted  . Dysphagia 07/07/2019  . Concussion w loss of consciousness of unsp duration, subs 12/29/2018  . Hospital discharge follow-up 12/29/2018  . Syncope 12/16/2018  . Dizziness and giddiness 10/01/2018  . PAD (peripheral artery disease) ([place]) 10/01/2018  . Cerebrovascular disease 02/07/2018  . CAD (coronary artery disease), native coronary artery 11/04/2017  . Vertigo 10/11/2017  . Right foot pain 08/31/2017  . Edema of right lower extremity 07/18/2017  . RLS (restless legs syndrome) 07/18/2017  . Hx of CABG 09/21/2016  . IBS (irritable bowel syndrome) 11/03/2015  . Generalized anxiety disorder 01/20/2015  . Overweight 05/24/2014  .  Unspecified vitamin D deficiency 05/22/2014  . OSA on CPAP 01/19/2014  . Lumbar pain with radiation down left leg 12/01/2013  . Contact eczema 10/15/2013  . Encounter for preventive health examination 01/13/2013  . GERD (gastroesophageal reflux disease) 01/06/2013  . Bilateral carotid artery stenosis 12/07/2012  . Hyperlipidemia LDL goal <70 07/09/2012  . Impaired glucose tolerance 07/09/2012  . Basal cell carcinoma 07/09/2012  . BPPV (benign paroxysmal positional vertigo) 07/09/2012  . Osteoporosis   . BPH (benign prostatic hyperplasia)   . Hypertension          Orinda Kenner, [place], CCC-SLP Daphne Karrer 08/21/2019, 3:05 PM  [facility] [place], [place], [address] Phone: [phone]   Fax:     Name: David Atkins MRN: [id number] Date of Birth: [birth year]

## 2019-08-22 ENCOUNTER — Other Ambulatory Visit: Payer: Self-pay | Admitting: Internal Medicine

## 2019-08-31 ENCOUNTER — Other Ambulatory Visit: Payer: Self-pay | Admitting: Internal Medicine

## 2019-09-08 DIAGNOSIS — R1310 Dysphagia, unspecified: Secondary | ICD-10-CM | POA: Diagnosis not present

## 2019-09-08 DIAGNOSIS — R1314 Dysphagia, pharyngoesophageal phase: Secondary | ICD-10-CM | POA: Diagnosis not present

## 2019-09-17 IMAGING — CT CT HEAD W/O CM
3 series · 15 of 47 positions shown, 18 images · non-contrast
Comparison: MRI 01/02/2018. CT scan 01/06/2014

CLINICAL DATA: Syncope with head injury

EXAM:
CT HEAD WITHOUT CONTRAST
TECHNIQUE: Contiguous axial images were obtained from the base of the skull
through the vertex without intravenous contrast.

[Series 3: head wo · axial · 0.41mm/px · z∈[-119,+11]mm · 9 of 32 slices shown, 12 images]
[im 3/32  brain]
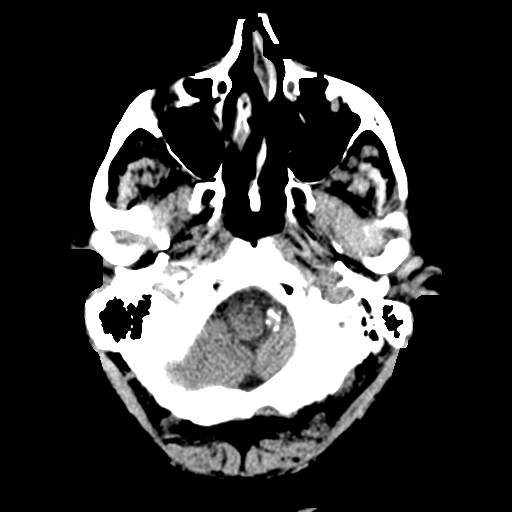
[im 3/32  bone]
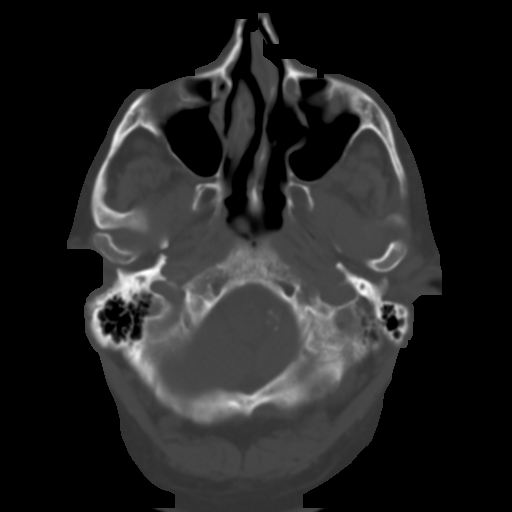
[im 6/32  brain]
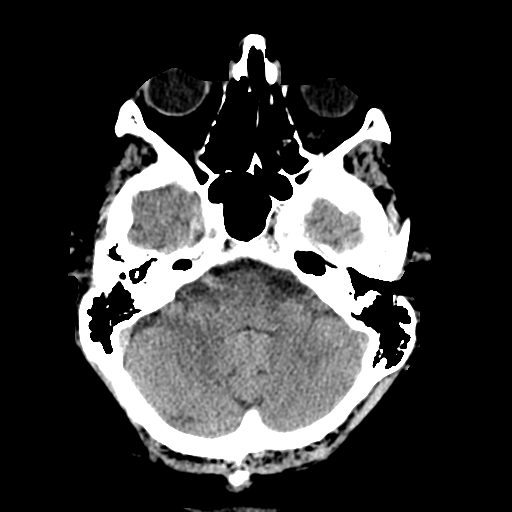
[im 9/32  brain]
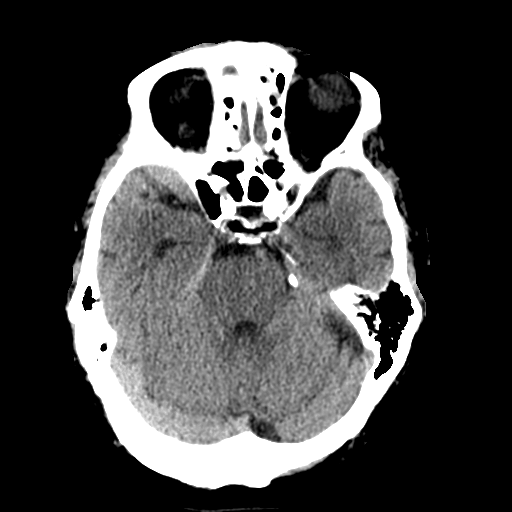
[im 12/32  brain]
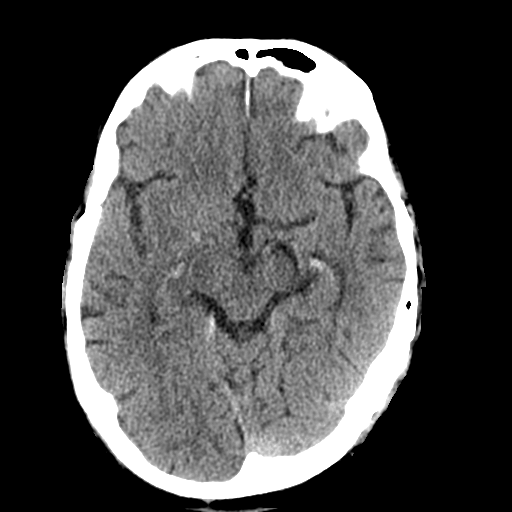
[im 17/32  brain]
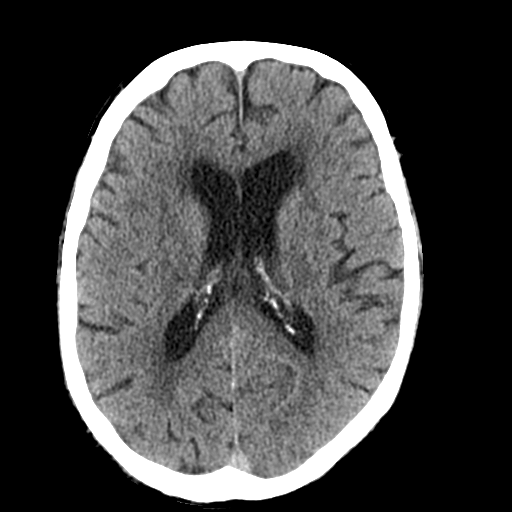
[im 17/32  bone]
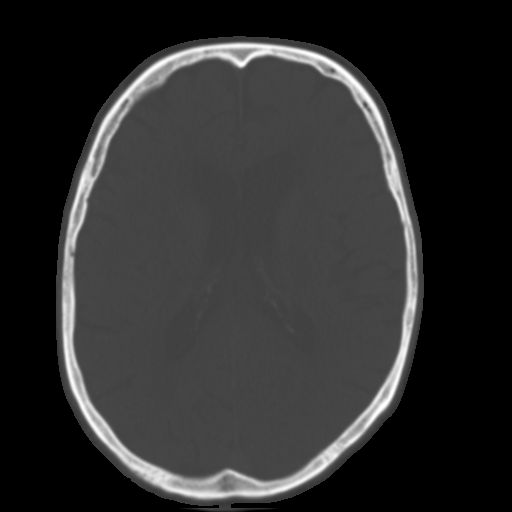
[im 20/32  brain]
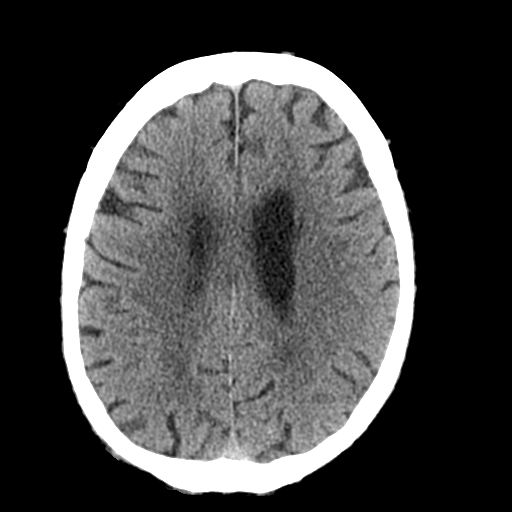
[im 23/32  brain]
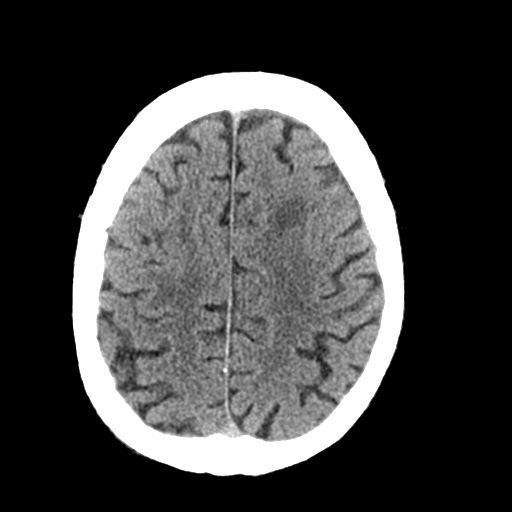
[im 26/32  brain]
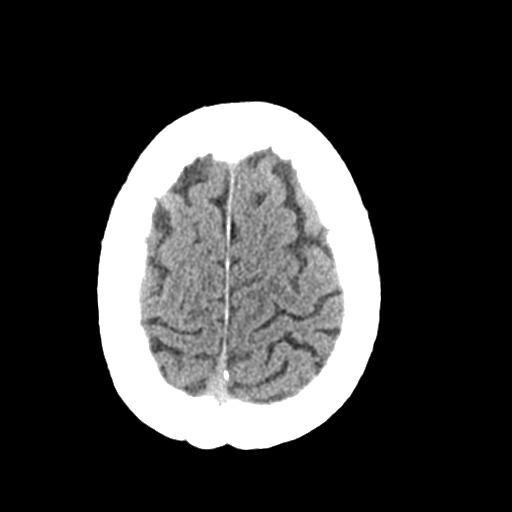
[im 29/32  brain]
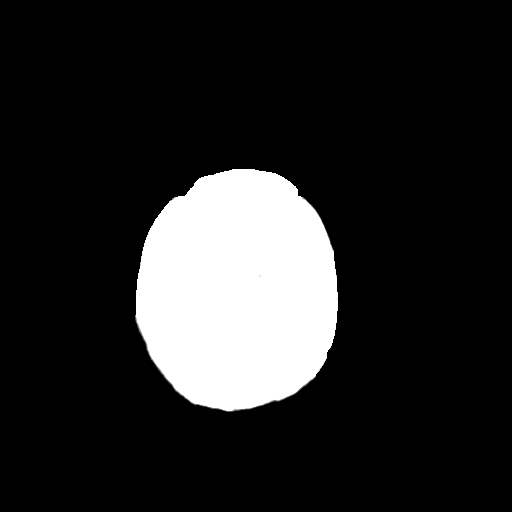
[im 29/32  bone]
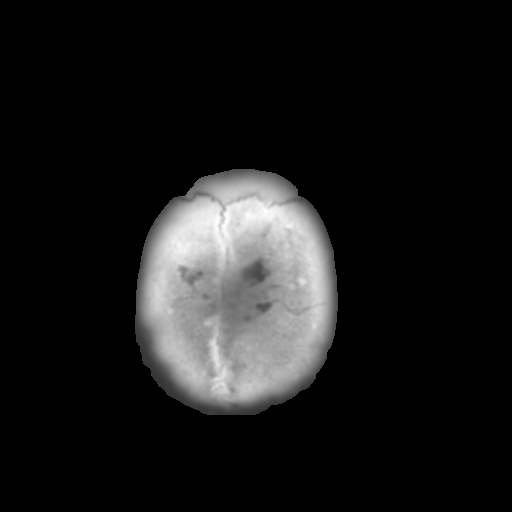

[Series 4: coronal soft tissue · coronal · 0.30mm/px · 3 of 67 slices shown]
[im 23/67  brain]
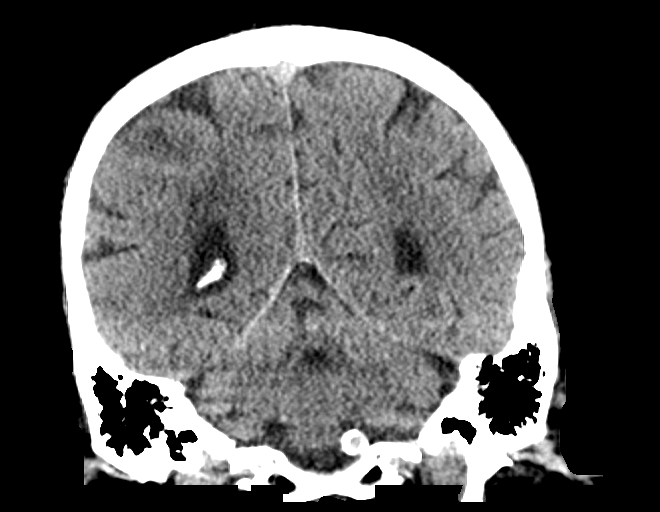
[im 30/67  brain]
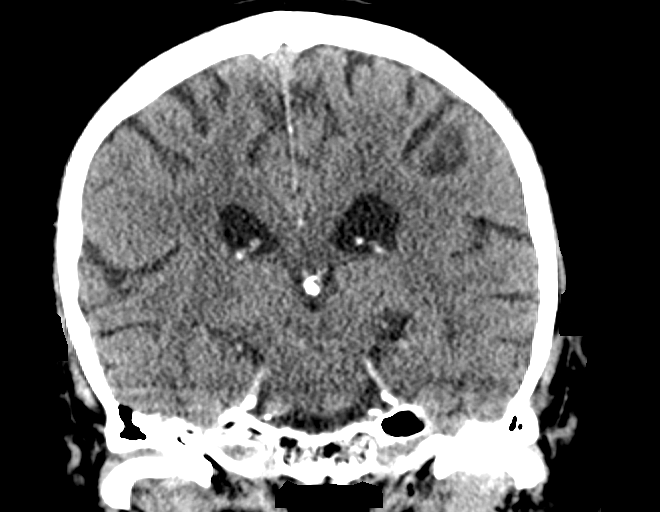
[im 37/67  brain]
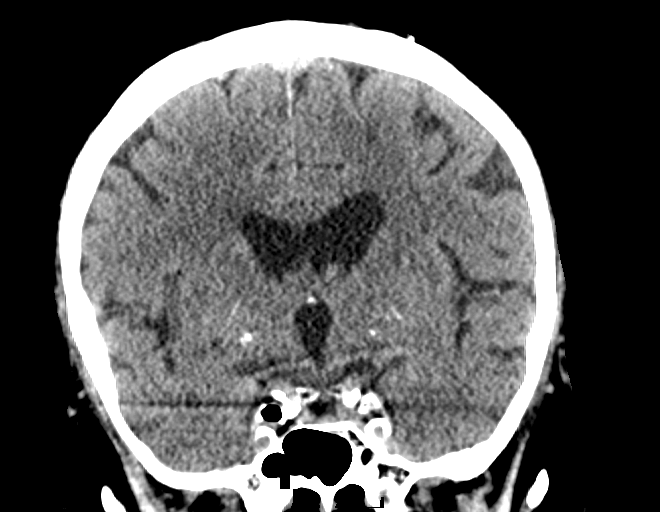

[Series 5: sagittal soft tissue · sagittal · 0.30mm/px · 3 of 55 slices shown]
[im 19/55  brain]
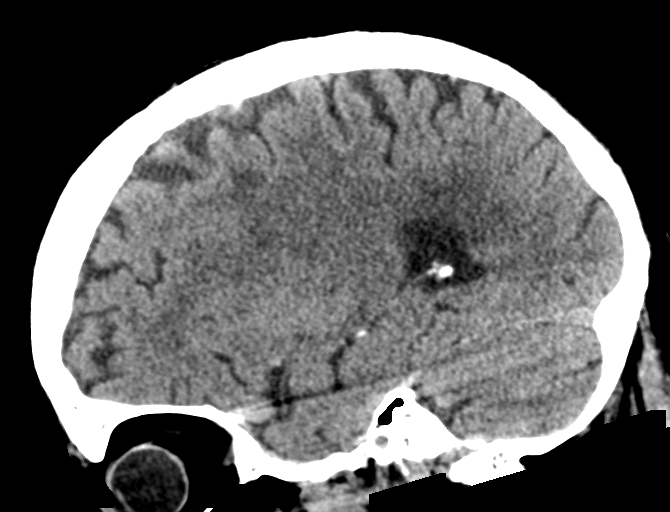
[im 28/55  brain]
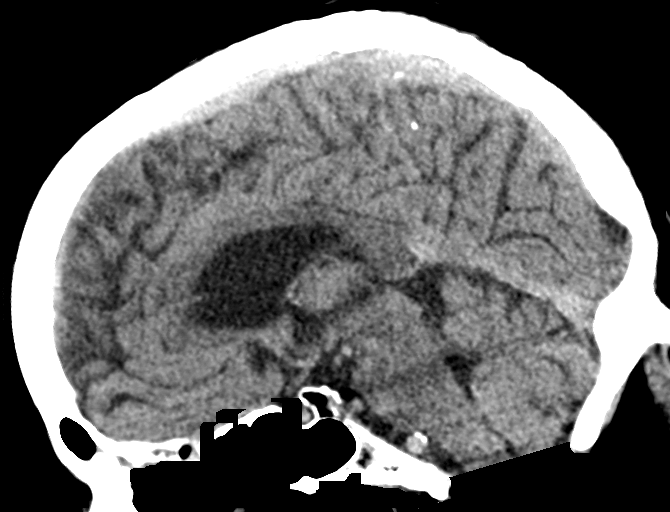
[im 37/55  brain]
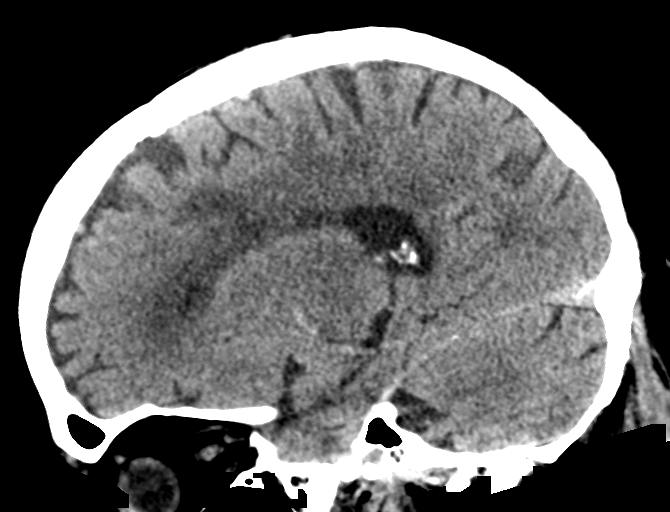

[15 of 47 positions shown; findings below may reference images not displayed]

FINDINGS: Brain: There is no evidence for acute hemorrhage, hydrocephalus,
mass lesion, or abnormal extra-axial fluid collection. No definite
CT evidence for acute infarction. Patchy low attenuation in the deep
hemispheric and periventricular white matter is nonspecific, but
likely reflects chronic microvascular ischemic demyelination.

Vascular: No hyperdense vessel or unexpected calcification.

Skull: No evidence for fracture. No worrisome lytic or sclerotic
lesion.

Sinuses/Orbits: The visualized paranasal sinuses and mastoid air
cells are clear. Visualized portions of the globes and intraorbital
fat are unremarkable.

Other: None.
IMPRESSION: 1. No acute intracranial abnormality.
2. Chronic small vessel white matter ischemic disease.

## 2019-09-17 IMAGING — CT CT ABD-PELV W/ CM
2 of 5 series · 16 of 46 positions shown, 18 images · IV contrast (APPLIED)
Comparison: 09/01/2016, 10/31/2015 and earlier.

CLINICAL DATA: 89-year-old with a syncopal episode earlier this
evening

EXAM:
CT ABDOMEN AND PELVIS WITH CONTRAST
TECHNIQUE: Multidetector CT imaging of the abdomen and pelvis was performed
using the standard protocol following bolus administration of
intravenous contrast.
CONTRAST:  100mL OMNIPAQUE IOHEXOL 300 MG/ML IV.

[Series 3: routine abd/pel with · axial · 0.84mm/px · z∈[-595,-160]mm · 13 of 99 slices shown, 15 images]
[im 6/99  soft-tissue]
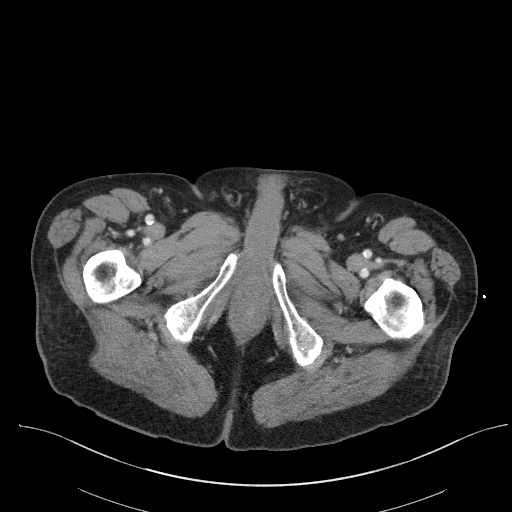
[im 6/99  bone]
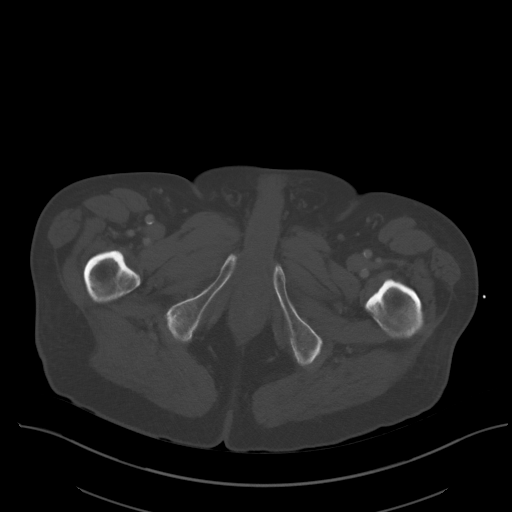
[im 16/99  soft-tissue]
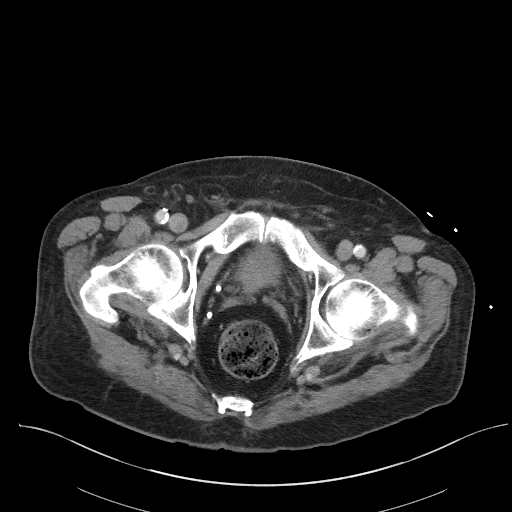
[im 21/99  soft-tissue]
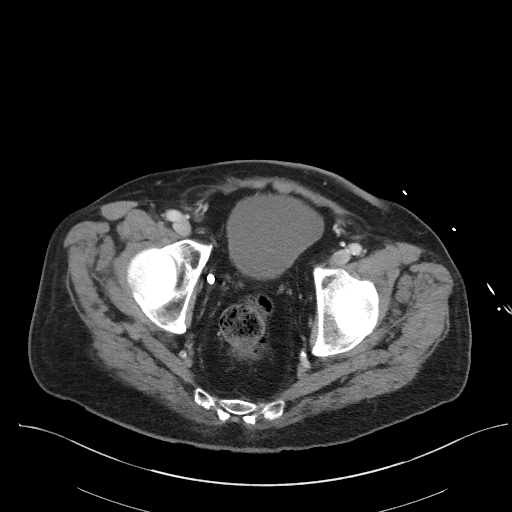
[im 26/99  soft-tissue]
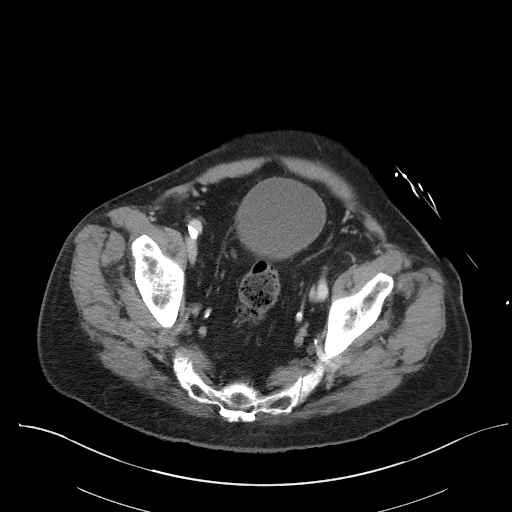
[im 37/99  soft-tissue]
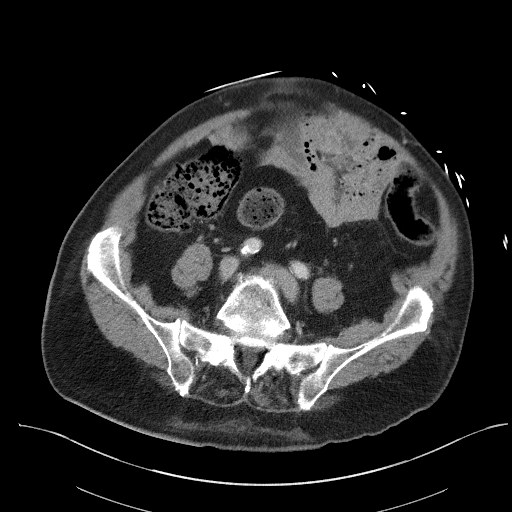
[im 42/99  soft-tissue]
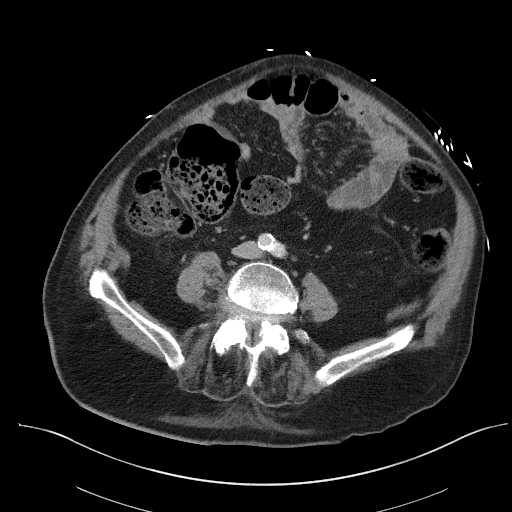
[im 52/99  soft-tissue]
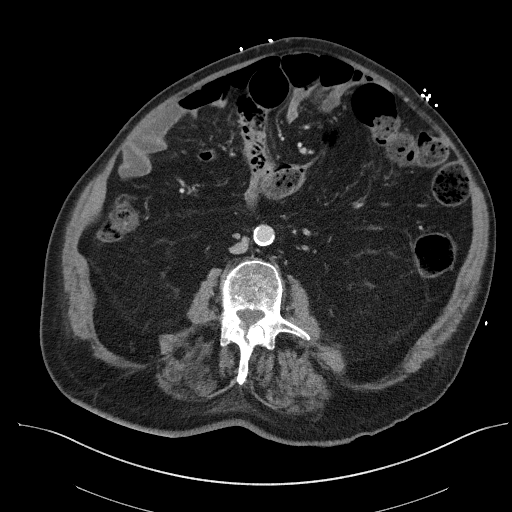
[im 57/99  soft-tissue]
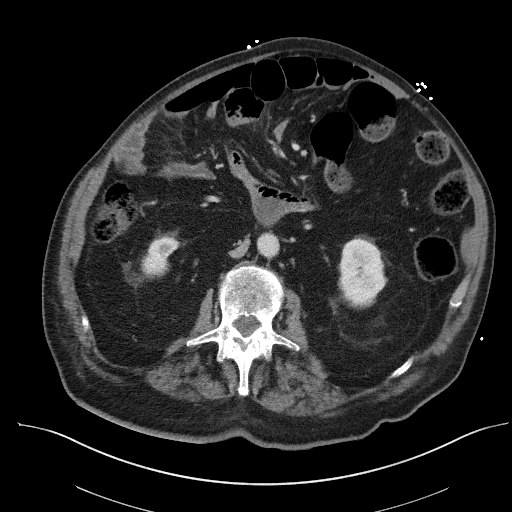
[im 62/99  soft-tissue]
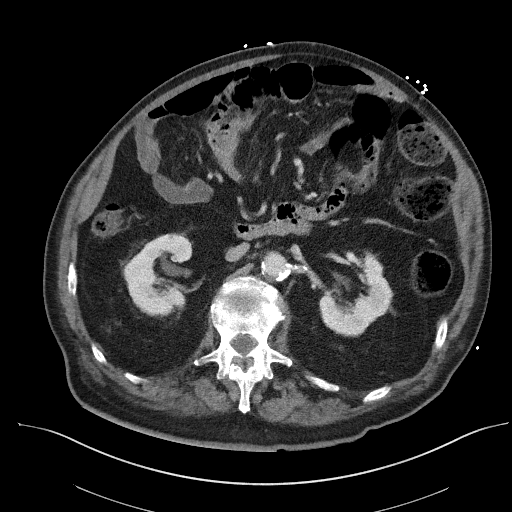
[im 62/99  bone]
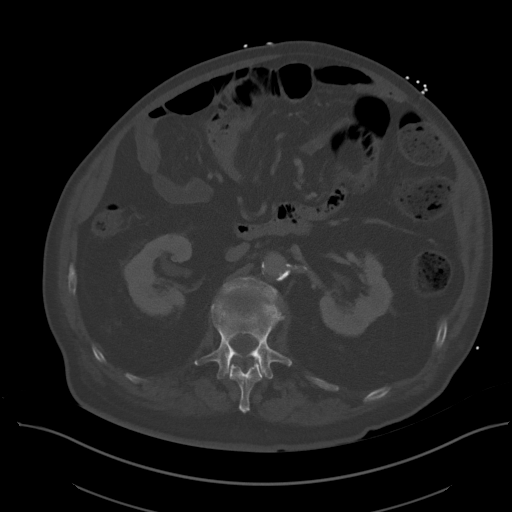
[im 73/99  soft-tissue]
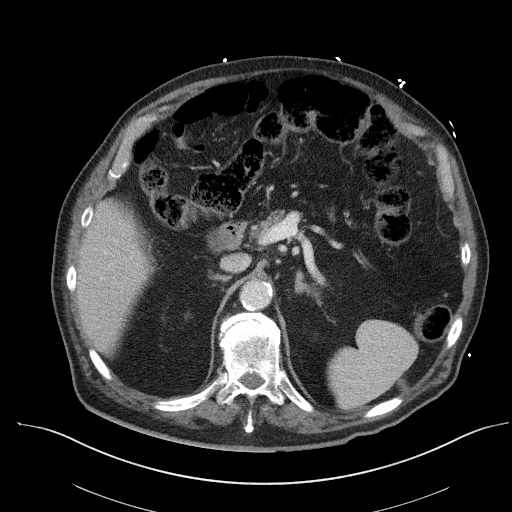
[im 78/99  soft-tissue]
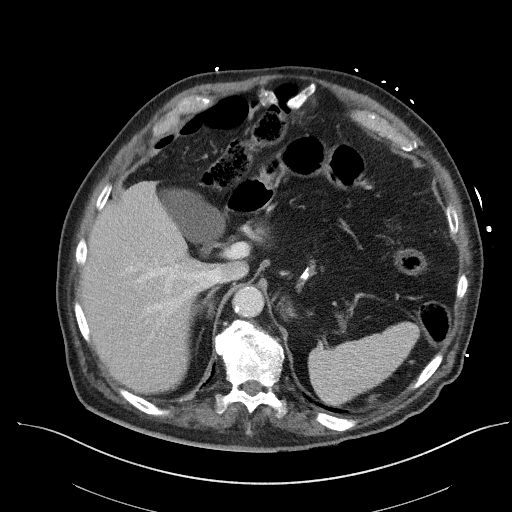
[im 83/99  soft-tissue]
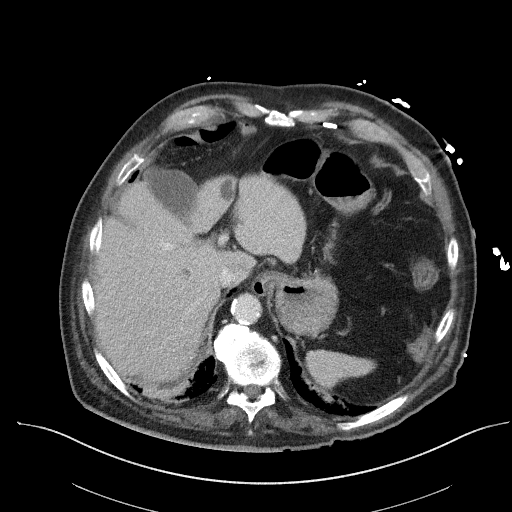
[im 93/99  soft-tissue]
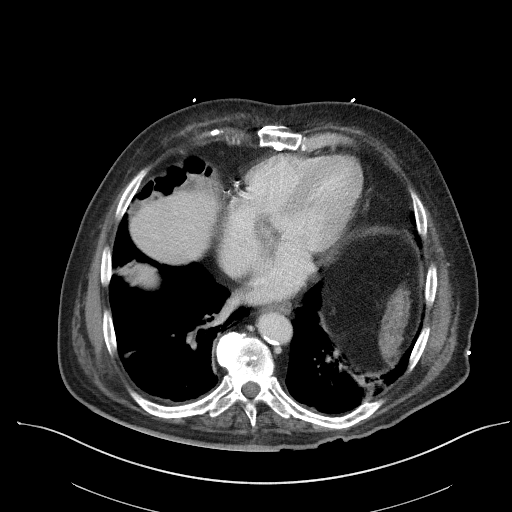

[Series 6: coronal st · coronal · 0.80mm/px · 3 of 114 slices shown]
[im 38/114  soft-tissue]
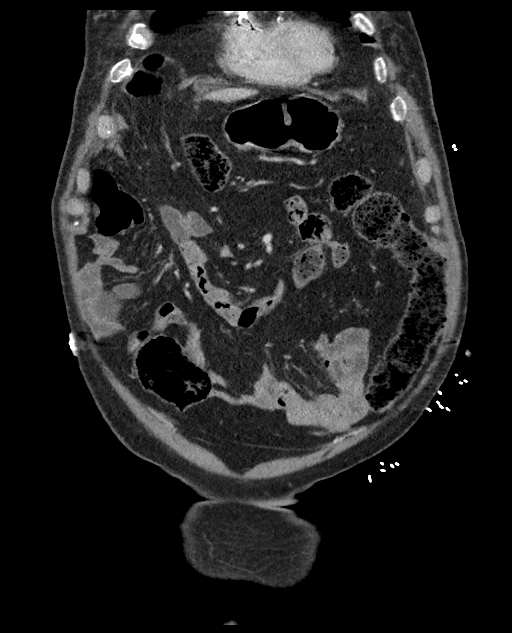
[im 51/114  soft-tissue]
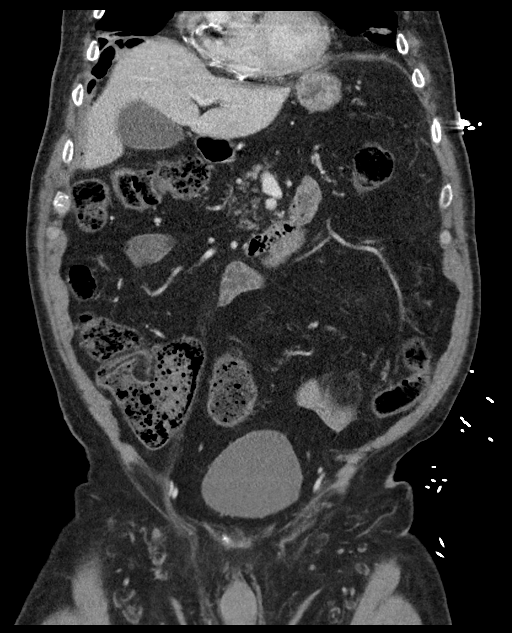
[im 63/114  soft-tissue]
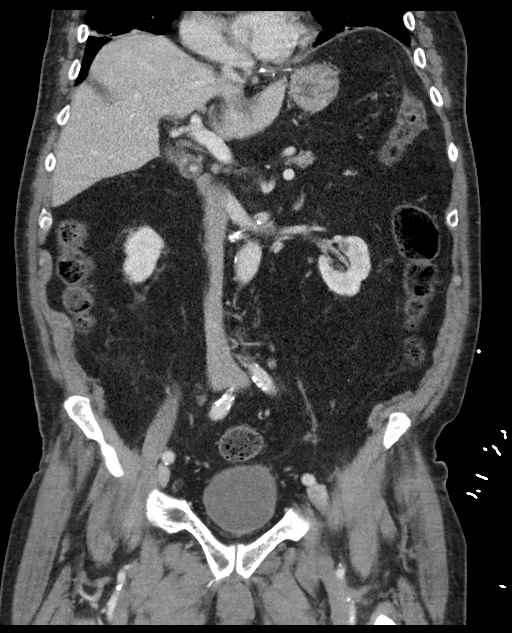

[16 of 46 positions shown; findings below may reference images not displayed]

FINDINGS: Lower chest: Atelectasis and/or scarring in the lower lobes, right
middle lobe and lingula. Heart moderately enlarged with three-vessel
native coronary atherosclerosis and prior CABG.

Hepatobiliary: Benign cysts in the MEDIAL segment LEFT lobe of liver
and RIGHT lobe near the dome, unchanged. No new or significant
hepatic parenchymal masses. Gallbladder normal in appearance without
calcified gallstones. Gallstones within the otherwise normal
appearing gallbladder. No biliary ductal dilation.

Pancreas: Moderate atrophy. No mass or peripancreatic inflammation.

Spleen: Normal in size and appearance.

Adrenals/Urinary Tract: Normal appearing adrenal glands. Diffuse
cortical thinning involving both kidneys. Benign cysts arising from
the UPPER pole of the RIGHT kidney. No significant focal parenchymal
abnormality involving either kidney no hydronephrosis. No urinary
tract calculi. Normal appearing urinary bladder.

Stomach/Bowel: Stomach normal in appearance for the degree of
distention. Normal-appearing small bowel. Very large stool burden
throughout the tortuous and elongated colon with the exception of
the completely collapsed splenic flexure. No evidence of
diverticulosis. Appendix surgically absent.

Vascular/Lymphatic: Severe aorto-iliofemoral atherosclerosis without
evidence of aneurysm. Normal-appearing portal venous and systemic
venous systems.

No pathologic lymphadenopathy.

Reproductive: Normal sized prostate gland containing calcifications.
Normal seminal vesicles.

Other: None.

Musculoskeletal: Remote T12 compression fracture, unchanged. Severe
facet degenerative changes throughout the lumbar spine with slight
grade 1 spondylolisthesis of L4 on L5, unchanged. DISH involving the
LOWER thoracic spine. No acute findings.
IMPRESSION: 1. No acute abnormalities involving the abdomen or pelvis. Very
large colonic stool burden.
2. Cholelithiasis without evidence of acute cholecystitis.
3.  Aortic Atherosclerosis (P2T2R-170.0)

## 2019-10-01 ENCOUNTER — Other Ambulatory Visit: Payer: Self-pay | Admitting: Internal Medicine

## 2019-10-03 ENCOUNTER — Ambulatory Visit (INDEPENDENT_AMBULATORY_CARE_PROVIDER_SITE_OTHER): Payer: Medicare Other | Admitting: Vascular Surgery

## 2019-10-03 ENCOUNTER — Encounter (INDEPENDENT_AMBULATORY_CARE_PROVIDER_SITE_OTHER): Payer: Medicare Other

## 2019-10-28 ENCOUNTER — Encounter (INDEPENDENT_AMBULATORY_CARE_PROVIDER_SITE_OTHER): Payer: Medicare Other

## 2019-10-28 ENCOUNTER — Ambulatory Visit (INDEPENDENT_AMBULATORY_CARE_PROVIDER_SITE_OTHER): Payer: Medicare Other | Admitting: Vascular Surgery

## 2019-10-28 DIAGNOSIS — R1314 Dysphagia, pharyngoesophageal phase: Secondary | ICD-10-CM | POA: Diagnosis not present

## 2019-10-28 DIAGNOSIS — Z01812 Encounter for preprocedural laboratory examination: Secondary | ICD-10-CM | POA: Diagnosis not present

## 2019-11-04 DIAGNOSIS — Z951 Presence of aortocoronary bypass graft: Secondary | ICD-10-CM | POA: Diagnosis not present

## 2019-11-04 DIAGNOSIS — I493 Ventricular premature depolarization: Secondary | ICD-10-CM | POA: Diagnosis not present

## 2019-11-04 DIAGNOSIS — R1314 Dysphagia, pharyngoesophageal phase: Secondary | ICD-10-CM | POA: Diagnosis not present

## 2019-11-04 DIAGNOSIS — I6529 Occlusion and stenosis of unspecified carotid artery: Secondary | ICD-10-CM | POA: Diagnosis not present

## 2019-11-11 ENCOUNTER — Encounter (INDEPENDENT_AMBULATORY_CARE_PROVIDER_SITE_OTHER): Payer: Self-pay | Admitting: Vascular Surgery

## 2019-11-11 ENCOUNTER — Ambulatory Visit (INDEPENDENT_AMBULATORY_CARE_PROVIDER_SITE_OTHER): Payer: Medicare Other

## 2019-11-11 ENCOUNTER — Ambulatory Visit (INDEPENDENT_AMBULATORY_CARE_PROVIDER_SITE_OTHER): Payer: Medicare Other | Admitting: Vascular Surgery

## 2019-11-11 ENCOUNTER — Other Ambulatory Visit: Payer: Self-pay

## 2019-11-11 VITALS — BP 164/79 | HR 74 | Resp 19 | Ht 70.0 in | Wt 200.0 lb

## 2019-11-11 DIAGNOSIS — E785 Hyperlipidemia, unspecified: Secondary | ICD-10-CM | POA: Diagnosis not present

## 2019-11-11 DIAGNOSIS — I6523 Occlusion and stenosis of bilateral carotid arteries: Secondary | ICD-10-CM

## 2019-11-11 DIAGNOSIS — I739 Peripheral vascular disease, unspecified: Secondary | ICD-10-CM | POA: Diagnosis not present

## 2019-11-11 DIAGNOSIS — I10 Essential (primary) hypertension: Secondary | ICD-10-CM | POA: Diagnosis not present

## 2019-11-11 NOTE — Assessment & Plan Note (Signed)
lipid control important in reducing the progression of atherosclerotic disease. Continue statin therapy  

## 2019-11-11 NOTE — Assessment & Plan Note (Signed)
blood pressure control important in reducing the progression of atherosclerotic disease. On appropriate oral medications.  

## 2019-11-11 NOTE — Progress Notes (Signed)
MRN : [id number]  David Atkins is a 85 y.o. ([birth year]) male who presents with chief complaint of  Chief Complaint  Patient presents with  . Follow-up  .  History of Present Illness: Patient returns today in follow up of multiple vascular issues.  He is doing well today and does not have specific complaints.  His ABIs today were 1.23 on the right and 1.24 on the left in the anterior tibial artery with the posterior tibial artery being likely falsely elevated from medial calcification.  His digit pressures and waveforms were good bilaterally. We also studied his carotids today.  No focal neurologic symptoms. Specifically, the patient denies amaurosis fugax, speech or swallowing difficulties, or arm or leg weakness or numbness. Duplex today shows somewhat lower velocities than on his previous study.  The right carotid artery velocities would be in the 1 to 39% range.  The left carotid artery velocities would fall in the 40 to 59% range.  Current Outpatient Medications  Medication Sig Dispense Refill  . acetaminophen (TYLENOL) 500 MG tablet Take 500 mg by mouth at bedtime.     Marland Kitchen amLODipine (NORVASC) 2.5 MG tablet TAKE 1 TABLET BY MOUTH  DAILY 90 tablet 3  . aspirin 81 MG EC tablet Take 81 mg by mouth daily.     . Blood Pressure Monitoring (BLOOD PRESSURE MONITOR AUTOMAT) DEVI Use daily to check blood pressure 1 Device 0  . Calcium Carbonate-Vitamin D (CALCIUM + D PO) Take 1 tablet by mouth daily.     . Cholecalciferol (VITAMIN D-3) 1000 UNITS CAPS Take 1 capsule by mouth daily.    . diclofenac sodium (VOLTAREN) 1 % GEL APPLY 4 GRAMS TOPICALLY 4  (FOUR) TIMES DAILY. 400 g 0  . finasteride (PROSCAR) 5 MG tablet TAKE 1 TABLET BY MOUTH  DAILY 90 tablet 3  . fluocinonide ointment (LIDEX) 0.05 % APPLY TO AFFECTED AREA TWICE DAILY UNTIL CLEAR, THEN AS NEEDED.    Marland Kitchen lisinopril (ZESTRIL) 20 MG tablet TAKE 1 TABLET BY MOUTH  DAILY 90 tablet 3  . meclizine (ANTIVERT) 12.5 MG tablet TAKE 1 TABLET (12.5  MG TOTAL) BY MOUTH 3 (THREE) TIMES DAILY AS NEEDED. (Patient taking differently: Take 12.5 mg by mouth 3 (three) times daily as needed for dizziness. ) 30 tablet 1  . metoprolol succinate (TOPROL-XL) 25 MG 24 hr tablet Take by mouth.    . metoprolol tartrate (LOPRESSOR) 25 MG tablet TAKE ONE-HALF TABLET BY  MOUTH TWO TIMES DAILY 90 tablet 1  . omeprazole (PRILOSEC) 20 MG capsule TAKE 1 CAPSULE BY MOUTH  DAILY 90 capsule 3  . oxybutynin (DITROPAN) 5 MG tablet TAKE 1 TABLET BY MOUTH  TWICE DAILY 180 tablet 3  . polyethylene glycol powder (GLYCOLAX/MIRALAX) powder Take 17 g by mouth once. Dissolve 1 capful of power into any liquid and drink once daily (Patient taking differently: Take 17 g by mouth at bedtime. Dissolve 1 capful of power into any liquid and drink once daily) 500 g 0  . rOPINIRole (REQUIP) 0.25 MG tablet TAKE 1 TABLET BY MOUTH AT  BEDTIME. 90 tablet 3  . Simethicone Extra Strength 125 MG CAPS Take 125 mg by mouth as needed (gas).    . simvastatin (ZOCOR) 20 MG tablet TAKE 1 TABLET BY MOUTH AT  BEDTIME 90 tablet 1  . tamsulosin (FLOMAX) 0.4 MG CAPS capsule TAKE ONE CAPSULE BY MOUTH  EVERY DAY AFTER BREAKFAST 90 capsule 3  . cephALEXin (KEFLEX) 500 MG capsule Take 1 capsule (500  mg total) by mouth 4 (four) times daily. (Patient not taking: Reported on 11/11/2019) 40 capsule 0  . diclofenac (VOLTAREN) 75 MG EC tablet Take 1 tablet (75 mg total) by mouth 2 (two) times daily. (Patient not taking: Reported on 11/11/2019) 60 tablet 1  . ipratropium (ATROVENT) 0.03 % nasal spray      No current facility-administered medications for this visit.     Past Medical History:  Diagnosis Date  . 3-vessel coronary artery disease 2006   [place]  . basal cell   . BPH (benign prostatic hyperplasia)   . GERD (gastroesophageal reflux disease)   . Hyperlipidemia   . Hypertension   . Lumbar stenosis 12/03/13   MRI - lumbar pain with radiation down to LE  . Myocardial infarction ([place]) 2006  . OAB  (overactive bladder)   . Osteoporosis 2010   [facility]  . Sleep apnea   . Spondylolisthesis of lumbar region 11/23/13   MRI Lumbar spine    Past Surgical History:  Procedure Laterality Date  . APPENDECTOMY  1951  . COLONOSCOPY    . COLONOSCOPY WITH PROPOFOL N/A 11/15/2015   Procedure: COLONOSCOPY WITH PROPOFOL;  Surgeon: Hulen Luster, MD;  Location: [facility] ENDOSCOPY;  Service: Gastroenterology;  Laterality: N/A;  . CORONARY ARTERY BYPASS GRAFT  2006   3 vessel, s/p AMI  . NASAL SEPTOPLASTY W/ TURBINOPLASTY  1981  . TONSILLECTOMY       Social History   Tobacco Use  . Smoking status: Former Smoker    Types: Cigarettes    Quit date: 07/10/1963    Years since quitting: 56.3  . Smokeless tobacco: Never Used  Substance Use Topics  . Alcohol use: No  . Drug use: No      Family History  Problem Relation Age of Onset  . Hyperlipidemia Mother   . Hypertension Mother   . Hypertension Father   No bleeding or clotting disorders  Allergies  Allergen Reactions  . Sulfa Antibiotics Rash    REVIEW OF SYSTEMS(Negative unless checked)  Constitutional: [] ?Weight loss [] ?Fever [] ?Chills Cardiac: [] ?Chest pain [] ?Chest pressure [] ?Palpitations [] ?Shortness of breath when laying flat [] ?Shortness of breath at rest [] ?Shortness of breath with exertion. Vascular: [x] ?Pain in legs with walking [] ?Pain in legs at rest [] ?Pain in legs when laying flat [] ?Claudication [] ?Pain in feet when walking [] ?Pain in feet at rest [] ?Pain in feet when laying flat [] ?History of DVT [] ?Phlebitis [] ?Swelling in legs [] ?Varicose veins [] ?Non-healing ulcers Pulmonary: [] ?Uses home oxygen [] ?Productive cough [] ?Hemoptysis [] ?Wheeze [] ?COPD [] ?Asthma Neurologic: [x] ?Dizziness [] ?Blackouts [] ?Seizures [] ?History of stroke [] ?History of TIA [] ?Aphasia [] ?Temporary blindness [] ?Dysphagia [] ?Weakness or numbness in arms [] ?Weakness or numbness in legs  Musculoskeletal: [x] ?Arthritis [] ?Joint swelling [] ?Joint pain [] ?Low back pain Hematologic: [] ?Easy bruising [] ?Easy bleeding [] ?Hypercoagulable state [] ?Anemic  Gastrointestinal: [] ?Blood in stool [] ?Vomiting blood [] ?Gastroesophageal reflux/heartburn [] ?Abdominal pain Genitourinary: [] ?Chronic kidney disease [] ?Difficult urination [] ?Frequent urination [] ?Burning with urination [] ?Hematuria Skin: [] ?Rashes [] ?Ulcers [] ?Wounds Psychological: [] ?History of anxiety [] ?History of major depression.  Physical Examination  BP (!) 164/79 (BP Location: Right Arm)   Pulse 74   Resp 19   Ht 5\' 10"  (1.778 m)   Wt 200 lb (90.7 kg)   BMI 28.70 kg/m  Gen:  WD/WN, NAD.  Appears younger than stated age Head: [place]/AT, No temporalis wasting. Ear/Nose/Throat: Hearing grossly intact, nares w/o erythema or drainage Eyes: Conjunctiva clear. Sclera non-icteric Neck: Supple.  Trachea midline Pulmonary:  Good air movement, no use of accessory muscles.  Cardiac: RRR, no JVD Vascular:  Vessel Right Left  Radial Palpable Palpable                          PT Palpable Palpable  DP Palpable Palpable    Musculoskeletal: M/S 5/5 throughout.  No deformity or atrophy. No LE edema. Neurologic: Sensation grossly intact in extremities.  Symmetrical.  Speech is fluent.  Psychiatric: Judgment intact, Mood & affect appropriate for pt's clinical situation. Dermatologic: No rashes or ulcers noted.  No cellulitis or open wounds.       Labs No results found for this or any previous visit (from the past 2160 hour(s)).  Radiology Vas [place] Abi With/wo Tbi  Result Date: 11/11/2019 LOWER EXTREMITY DOPPLER STUDY Indications: Peripheral artery disease. High Risk Factors: Hypertension, hyperlipidemia.  Comparison Study: 11/05/2018 Performing Technologist: Almira Coaster RVS  Examination Guidelines: A complete evaluation includes at minimum, Doppler waveform signals and systolic  blood pressure reading at the level of bilateral brachial, anterior tibial, and posterior tibial arteries, when vessel segments are accessible. Bilateral testing is considered an integral part of a complete examination. Photoelectric Plethysmograph (PPG) waveforms and toe systolic pressure readings are included as required and additional duplex testing as needed. Limited examinations for reoccurring indications may be performed as noted.  ABI Findings: +---------+------------------+-----+---------+--------+ Right    Rt Pressure (mmHg)IndexWaveform Comment  +---------+------------------+-----+---------+--------+ Brachial 142                                      +---------+------------------+-----+---------+--------+ ATA      174               1.23 triphasic         +---------+------------------+-----+---------+--------+ PTA      248               1.75 triphasic         +---------+------------------+-----+---------+--------+ Great Toe132               0.93 Normal            +---------+------------------+-----+---------+--------+ +---------+------------------+-----+---------+-------+ Left     Lt Pressure (mmHg)IndexWaveform Comment +---------+------------------+-----+---------+-------+ Brachial 135                                     +---------+------------------+-----+---------+-------+ ATA      176               1.24 triphasic        +---------+------------------+-----+---------+-------+ PTA      229               1.61 triphasic        +---------+------------------+-----+---------+-------+ Great Toe122               0.86 Normal           +---------+------------------+-----+---------+-------+ +-------+-----------+-----------+------------+------------+ ABI/TBIToday's ABIToday's TBIPrevious ABIPrevious TBI +-------+-----------+-----------+------------+------------+ Right  1.75       .93        1.38        [place]            +-------+-----------+-----------+------------+------------+ Left   1.61       .86        1.48        [place]           +-------+-----------+-----------+------------+------------+ Bilateral ABIs appear essentially unchanged compared to prior study  on 11/05/2018.  Summary: Right: Resting right ankle-brachial index is within normal range. No evidence of significant right lower extremity arterial disease. The right toe-brachial index is normal. Left: Resting left ankle-brachial index is within normal range. No evidence of significant left lower extremity arterial disease. The left toe-brachial index is normal.  *See table(s) above for measurements and observations.  Electronically signed by Leotis Pain MD on 11/11/2019 at 3:03:47 PM.    Final     Assessment/Plan  Hypertension blood pressure control important in reducing the progression of atherosclerotic disease. On appropriate oral medications.   Hyperlipidemia LDL goal <70 lipid control important in reducing the progression of atherosclerotic disease. Continue statin therapy   PAD (peripheral artery disease) (HCC) His ABIs today were 1.23 on the right and 1.24 on the left in the anterior tibial artery with the posterior tibial artery being likely falsely elevated from medial calcification.  His digit pressures and waveforms were good bilaterally.  Although significant calcifications present, overall blood flow appears to be well preserved.  No role for intervention.  Recheck in 1 year  Bilateral carotid artery stenosis Duplex today shows somewhat lower velocities than on his previous study.  The right carotid artery velocities would be in the 1 to 39% range.  The left carotid artery velocities would fall in the 40 to 59% range.  No role for interventions at these levels.  Continue medical regimen and recheck in 1 year.    Leotis Pain, MD  11/11/2019 4:11 PM    This note was created with Dragon medical transcription system.  Any errors from  dictation are purely unintentional

## 2019-11-11 NOTE — Assessment & Plan Note (Signed)
Duplex today shows somewhat lower velocities than on his previous study.  The right carotid artery velocities would be in the 1 to 39% range.  The left carotid artery velocities would fall in the 40 to 59% range.  No role for interventions at these levels.  Continue medical regimen and recheck in 1 year.

## 2019-11-11 NOTE — Assessment & Plan Note (Signed)
His ABIs today were 1.23 on the right and 1.24 on the left in the anterior tibial artery with the posterior tibial artery being likely falsely elevated from medial calcification.  His digit pressures and waveforms were good bilaterally.  Although significant calcifications present, overall blood flow appears to be well preserved.  No role for intervention.  Recheck in 1 year

## 2019-11-11 NOTE — Patient Instructions (Signed)
Peripheral Vascular Disease  Peripheral vascular disease (PVD) is a disease of the blood vessels that are not part of your heart and brain. A simple term for PVD is poor circulation. In most cases, PVD narrows the blood vessels that carry blood from your heart to the rest of your body. This can reduce the supply of blood to your arms, legs, and internal organs, like your stomach or kidneys. However, PVD most often affects a person's lower legs and feet. Without treatment, PVD tends to get worse. PVD can also lead to acute ischemic limb. This is when an arm or leg suddenly cannot get enough blood. This is a medical emergency. Follow these instructions at home: Lifestyle  Do not use any products that contain nicotine or tobacco, such as cigarettes and e-cigarettes. If you need help quitting, ask your doctor.  Lose weight if you are overweight. Or, stay at a healthy weight as told by your doctor.  Eat a diet that is low in fat and cholesterol. If you need help, ask your doctor.  Exercise regularly. Ask your doctor for activities that are right for you. General instructions  Take over-the-counter and prescription medicines only as told by your doctor.  Take good care of your feet: ? Wear comfortable shoes that fit well. ? Check your feet often for any cuts or sores.  Keep all follow-up visits as told by your doctor This is important. Contact a doctor if:  You have cramps in your legs when you walk.  You have leg pain when you are at rest.  You have coldness in a leg or foot.  Your skin changes.  You are unable to get or have an erection (erectile dysfunction).  You have cuts or sores on your feet that do not heal. Get help right away if:  Your arm or leg turns cold, numb, and blue.  Your arms or legs become red, warm, swollen, painful, or numb.  You have chest pain.  You have trouble breathing.  You suddenly have weakness in your face, arm, or leg.  You become very  confused or you cannot speak.  You suddenly have a very bad headache.  You suddenly cannot see. Summary  Peripheral vascular disease (PVD) is a disease of the blood vessels.  A simple term for PVD is poor circulation. Without treatment, PVD tends to get worse.  Treatment may include exercise, low fat and low cholesterol diet, and quitting smoking. This information is not intended to replace advice given to you by your health care provider. Make sure you discuss any questions you have with your health care provider. Document Released: 02/14/2010 Document Revised: 11/02/2017 Document Reviewed: 12/28/2016 Elsevier Patient Education  2020 [employer]  

## 2019-11-13 ENCOUNTER — Ambulatory Visit: Payer: Medicare Other

## 2019-11-14 ENCOUNTER — Ambulatory Visit (INDEPENDENT_AMBULATORY_CARE_PROVIDER_SITE_OTHER): Payer: Medicare Other

## 2019-11-14 ENCOUNTER — Ambulatory Visit (INDEPENDENT_AMBULATORY_CARE_PROVIDER_SITE_OTHER): Payer: Medicare Other | Admitting: Internal Medicine

## 2019-11-14 ENCOUNTER — Encounter: Payer: Self-pay | Admitting: Internal Medicine

## 2019-11-14 ENCOUNTER — Other Ambulatory Visit: Payer: Self-pay

## 2019-11-14 VITALS — BP 137/72 | Temp 94.7°F

## 2019-11-14 VITALS — BP 132/71 | Ht 70.0 in | Wt 200.0 lb

## 2019-11-14 DIAGNOSIS — R7302 Impaired glucose tolerance (oral): Secondary | ICD-10-CM | POA: Diagnosis not present

## 2019-11-14 DIAGNOSIS — I10 Essential (primary) hypertension: Secondary | ICD-10-CM | POA: Diagnosis not present

## 2019-11-14 DIAGNOSIS — K2100 Gastro-esophageal reflux disease with esophagitis, without bleeding: Secondary | ICD-10-CM | POA: Diagnosis not present

## 2019-11-14 DIAGNOSIS — E785 Hyperlipidemia, unspecified: Secondary | ICD-10-CM | POA: Diagnosis not present

## 2019-11-14 DIAGNOSIS — Z0000 Encounter for general adult medical examination without abnormal findings: Secondary | ICD-10-CM | POA: Diagnosis not present

## 2019-11-14 DIAGNOSIS — R1314 Dysphagia, pharyngoesophageal phase: Secondary | ICD-10-CM

## 2019-11-14 DIAGNOSIS — I6523 Occlusion and stenosis of bilateral carotid arteries: Secondary | ICD-10-CM

## 2019-11-14 MED ORDER — OMEPRAZOLE 20 MG PO CPDR: 40.0000 mg | DELAYED_RELEASE_CAPSULE | Freq: Every day | ORAL | 2 refills | Status: DC

## 2019-11-14 NOTE — Assessment & Plan Note (Signed)
Improved by repeat dopplers  Done Nov 11 2019.  Continue statin  And ASA . Goal LDL 70 or less

## 2019-11-14 NOTE — Progress Notes (Signed)
Telephone Note  This visit type was conducted due to national recommendations for restrictions regarding the COVID-19 pandemic (e.g. social distancing).  This format is felt to be most appropriate for this patient at this time.  All issues noted in this document were discussed and addressed.  No physical exam was performed (except for noted visual exam findings with Video Visits).   I connected with@ on 11/14/19 at  1:30 PM EST by telephone and verified that I am speaking with the correct person using two identifiers. Location patient: home Location provider: work or home office Persons participating in the virtual visit: patient, provider  I discussed the limitations, risks, security and privacy concerns of performing an evaluation and management service by telephone and the availability of in person appointments. I also discussed with the patient that there may be a patient responsible charge related to this service. The patient expressed understanding and agreed to proceed.  Reason for visit: follow up   HPI:  85 yr old male with hypertension , CAD,  PAD (with CVD) bilateral CAD improving compared to prior .   The patient has no signs or symptoms of COVID 19 infection (fever, cough, sore throat  or shortness of breath beyond what is typical for patient).  Patient denies contact with other persons with the above mentioned symptoms or with anyone confirmed to have COVID 19 . He and family have been sheltering at home.  Using [employer] delivery   Hypertension: patient checks blood pressure twice weekly at home.  Readings have been for the most part < 140/80 at rest . Patient is following a reduce salt diet most days and is taking medications as prescribed. Lisinopril and metop in am   And amlodipine  And metoprolol at night   Diagnosed by sleep study. She is wearing her CPAP every night a minimum of 6 hours per night and notes improved daytime wakefulness and decreased fatigue   Had a swallow  study at [facility] 10 days ago rec'd by ENT . HAD A STRICTURE o EGD AT [facility].   Had esophageal dilation , swallowing better but still has occasional cough .  REPEAT TESTING PLANNED.  INCREASE OMEPRAZOLE TO 40  MG .Marland Kitchen  FREQUENCY URINARY 2 TIMES PER NIGHT.     ROS: See pertinent positives and negatives per HPI.  Past Medical History:  Diagnosis Date  . 3-vessel coronary artery disease 2006   [place]  . basal cell   . BPH (benign prostatic hyperplasia)   . GERD (gastroesophageal reflux disease)   . Hyperlipidemia   . Hypertension   . Lumbar stenosis 12/03/13   MRI - lumbar pain with radiation down to LE  . Myocardial infarction ([place]) 2006  . OAB (overactive bladder)   . Osteoporosis 2010   by DEXA at [facility]  . Sleep apnea   . Spondylolisthesis of lumbar region 11/23/13   MRI Lumbar spine    Past Surgical History:  Procedure Laterality Date  . APPENDECTOMY  1951  . COLONOSCOPY    . COLONOSCOPY WITH PROPOFOL N/A 11/15/2015   Procedure: COLONOSCOPY WITH PROPOFOL;  Surgeon: Hulen Luster, MD;  Location: [facility] ENDOSCOPY;  Service: Gastroenterology;  Laterality: N/A;  . CORONARY ARTERY BYPASS GRAFT  2006   3 vessel, s/p AMI  . NASAL SEPTOPLASTY W/ TURBINOPLASTY  1981  . TONSILLECTOMY      Family History  Problem Relation Age of Onset  . Hyperlipidemia Mother   . Hypertension Mother   . Hypertension Father  SOCIAL HX:  reports that he quit smoking about 56 years ago. His smoking use included cigarettes. He has never used smokeless tobacco. He reports that he does not drink alcohol or use drugs.  Current Outpatient Medications:  .  acetaminophen (TYLENOL) 500 MG tablet, Take 500 mg by mouth at bedtime. , Disp: , Rfl:  .  amLODipine (NORVASC) 2.5 MG tablet, TAKE 1 TABLET BY MOUTH  DAILY, Disp: 90 tablet, Rfl: 3 .  aspirin 81 MG EC tablet, Take 81 mg by mouth daily. , Disp: , Rfl:  .  Blood Pressure Monitoring (BLOOD PRESSURE MONITOR AUTOMAT) DEVI, Use daily to check blood pressure,  Disp: 1 Device, Rfl: 0 .  Calcium Carbonate-Vitamin D (CALCIUM + D PO), Take 1 tablet by mouth daily. , Disp: , Rfl:  .  Cholecalciferol (VITAMIN D-3) 1000 UNITS CAPS, Take 1 capsule by mouth daily., Disp: , Rfl:  .  diclofenac (VOLTAREN) 75 MG EC tablet, Take 1 tablet (75 mg total) by mouth 2 (two) times daily., Disp: 60 tablet, Rfl: 1 .  diclofenac sodium (VOLTAREN) 1 % GEL, APPLY 4 GRAMS TOPICALLY 4  (FOUR) TIMES DAILY., Disp: 400 g, Rfl: 0 .  finasteride (PROSCAR) 5 MG tablet, TAKE 1 TABLET BY MOUTH  DAILY, Disp: 90 tablet, Rfl: 3 .  fluocinonide ointment (LIDEX) 0.05 %, APPLY TO AFFECTED AREA TWICE DAILY UNTIL CLEAR, THEN AS NEEDED., Disp: , Rfl:  .  ipratropium (ATROVENT) 0.03 % nasal spray, , Disp: , Rfl:  .  lisinopril (ZESTRIL) 20 MG tablet, TAKE 1 TABLET BY MOUTH  DAILY, Disp: 90 tablet, Rfl: 3 .  meclizine (ANTIVERT) 12.5 MG tablet, TAKE 1 TABLET (12.5 MG TOTAL) BY MOUTH 3 (THREE) TIMES DAILY AS NEEDED. (Patient taking differently: Take 12.5 mg by mouth 3 (three) times daily as needed for dizziness. ), Disp: 30 tablet, Rfl: 1 .  metoprolol tartrate (LOPRESSOR) 25 MG tablet, TAKE ONE-HALF TABLET BY  MOUTH TWO TIMES DAILY, Disp: 90 tablet, Rfl: 1 .  omeprazole (PRILOSEC) 20 MG capsule, Take 2 capsules (40 mg total) by mouth daily., Disp: 180 capsule, Rfl: 2 .  oxybutynin (DITROPAN) 5 MG tablet, TAKE 1 TABLET BY MOUTH  TWICE DAILY, Disp: 180 tablet, Rfl: 3 .  polyethylene glycol powder (GLYCOLAX/MIRALAX) powder, Take 17 g by mouth once. Dissolve 1 capful of power into any liquid and drink once daily (Patient taking differently: Take 17 g by mouth at bedtime. Dissolve 1 capful of power into any liquid and drink once daily), Disp: 500 g, Rfl: 0 .  rOPINIRole (REQUIP) 0.25 MG tablet, TAKE 1 TABLET BY MOUTH AT  BEDTIME., Disp: 90 tablet, Rfl: 3 .  Simethicone Extra Strength 125 MG CAPS, Take 125 mg by mouth as needed (gas)., Disp: , Rfl:  .  simvastatin (ZOCOR) 20 MG tablet, TAKE 1 TABLET BY  MOUTH AT  BEDTIME, Disp: 90 tablet, Rfl: 1 .  tamsulosin (FLOMAX) 0.4 MG CAPS capsule, TAKE ONE CAPSULE BY MOUTH  EVERY DAY AFTER BREAKFAST, Disp: 90 capsule, Rfl: 3  EXAM:   General impression: alert, cooperative and articulate.  No signs of being in distress  Lungs: speech is fluent sentence length suggests that patient is not short of breath and not punctuated by cough, sneezing or sniffing. Marland Kitchen   Psych: affect normal.  speech is articulate and non pressured .  Denies suicidal thoughts   ASSESSMENT AND PLAN:  Discussed the following assessment and plan:  Hyperlipidemia LDL goal <70 - Plan: Lipid panel  Hypertension, unspecified type -  Plan: Comprehensive metabolic panel  Impaired glucose tolerance - Plan: Hemoglobin A1c  Bilateral carotid artery stenosis  Gastroesophageal reflux disease with esophagitis without hemorrhage  Pharyngoesophageal dysphagia  Bilateral carotid artery stenosis Improved by repeat dopplers  Done Nov 11 2019.  Continue statin  And ASA . Goal LDL 70 or less   GERD (gastroesophageal reflux disease) With recent stricture dilated at [facility],  Increase omeprazole to 40 mg daily   Impaired glucose tolerance Last a1c was normal range. BMI, Low Gi diet reviewed with patient and need for regular exercise.  Repeat A1c due   Pharyngoesophageal dysphagia Secondary to esophogeal stricture with recent dilation resolving symptoms,  increase omeprazole to 40 mg daily   Hypertension Well controlled on current regimen. Renal function is due , no changes today.    I discussed the assessment and treatment plan with the patient. The patient was provided an opportunity to ask questions and all were answered. The patient agreed with the plan and demonstrated an understanding of the instructions.   The patient was advised to call back or seek an in-person evaluation if the symptoms worsen or if the condition fails to improve as anticipated.  I provided  25 minutes of  non-face-to-face time during this encounter reviewing patient's current problems and past procedures/imaging studies, providing counseling on the above mentioned problems , and coordination  of care . [name], MD

## 2019-11-14 NOTE — Patient Instructions (Addendum)
  David Atkins , Thank you for taking time to come for your Medicare Wellness Visit. I appreciate your ongoing commitment to your health goals. Please review the following plan we discussed and let me know if I can assist you in the future.   These are the goals we discussed: Goals    . Follow up with Primary Care Provider     As needed       This is a list of the screening recommended for you and due dates:  Health Maintenance  Topic Date Due  . Tetanus Vaccine  08/16/2027  . Flu Shot  Completed  . Pneumonia vaccines  Completed

## 2019-11-14 NOTE — Assessment & Plan Note (Signed)
Well controlled on current regimen. Renal function is due, no changes today. °

## 2019-11-14 NOTE — Assessment & Plan Note (Signed)
Secondary to esophogeal stricture with recent dilation resolving symptoms,  increase omeprazole to 40 mg daily

## 2019-11-14 NOTE — Assessment & Plan Note (Signed)
Last a1c was normal range. BMI, Low Gi diet reviewed with patient and need for regular exercise.  Repeat A1c due

## 2019-11-14 NOTE — Assessment & Plan Note (Addendum)
With recent stricture dilated at [facility],  Increase omeprazole to 40 mg daily

## 2019-11-14 NOTE — Progress Notes (Addendum)
Subjective:   David Atkins is a 85 y.o. male who presents for Medicare Annual/Subsequent preventive examination.  Review of Systems:  No ROS.  Medicare Wellness Virtual Visit.  Visual/audio telehealth visit, vital signs provided by patient.  See social history for additional risk factors.   Cardiac Risk Factors include: advanced age (>31men, >16 women);hypertension;male gender     Objective:    Vitals: BP 137/72 (BP Location: Left Arm, Patient Position: Sitting, Cuff Size: Normal)   Temp (!) 94.7 F (34.8 C) (Oral)   There is no height or weight on file to calculate BMI.  Advanced Directives 11/14/2019 12/16/2018 12/15/2018 11/11/2018 11/06/2017 08/31/2017 11/06/2016  Does Patient Have a Medical Advance Directive? Yes Yes Yes Yes Yes Yes Yes  Type of Paramedic of [place];Living will Living will Living will [place];Living will Living will;Healthcare Power of [place];Living will [place];Living will  Does patient want to make changes to medical advance directive? No - Patient declined No - Patient declined - No - Patient declined No - Patient declined - -  Copy of [place] in Chart? Yes - validated most recent copy scanned in chart (See row information) - - Yes - validated most recent copy scanned in chart (See row information) No - copy requested - No - copy requested    Tobacco Social History   Tobacco Use  Smoking Status Former Smoker  . Types: Cigarettes  . Quit date: 07/10/1963  . Years since quitting: 56.3  Smokeless Tobacco Never Used     Counseling given: Not Answered   Clinical Intake:  Pre-visit preparation completed: Yes        Diabetes: No  How often do you need to have someone help you when you read instructions, pamphlets, or other written materials from your doctor or pharmacy?: 1 - Never  Interpreter Needed?: No     Past Medical  History:  Diagnosis Date  . 3-vessel coronary artery disease 2006   [place]  . basal cell   . BPH (benign prostatic hyperplasia)   . GERD (gastroesophageal reflux disease)   . Hyperlipidemia   . Hypertension   . Lumbar stenosis 12/03/13   MRI - lumbar pain with radiation down to LE  . Myocardial infarction ([place]) 2006  . OAB (overactive bladder)   . Osteoporosis 2010   by DEXA at [facility]  . Sleep apnea   . Spondylolisthesis of lumbar region 11/23/13   MRI Lumbar spine   Past Surgical History:  Procedure Laterality Date  . APPENDECTOMY  1951  . COLONOSCOPY    . COLONOSCOPY WITH PROPOFOL N/A 11/15/2015   Procedure: COLONOSCOPY WITH PROPOFOL;  Surgeon: Hulen Luster, MD;  Location: [facility] ENDOSCOPY;  Service: Gastroenterology;  Laterality: N/A;  . CORONARY ARTERY BYPASS GRAFT  2006   3 vessel, s/p AMI  . NASAL SEPTOPLASTY W/ TURBINOPLASTY  1981  . TONSILLECTOMY     Family History  Problem Relation Age of Onset  . Hyperlipidemia Mother   . Hypertension Mother   . Hypertension Father    Social History   Socioeconomic History  . Marital status: Married    Spouse name: Not on file  . Number of children: Not on file  . Years of education: Not on file  . Highest education level: Not on file  Occupational History  . Not on file  Tobacco Use  . Smoking status: Former Smoker    Types: Cigarettes  Quit date: 07/10/1963    Years since quitting: 56.3  . Smokeless tobacco: Never Used  Substance and Sexual Activity  . Alcohol use: No  . Drug use: No  . Sexual activity: Not Currently  Other Topics Concern  . Not on file  Social History Narrative  . Not on file   Social Determinants of Health   Financial Resource Strain:   . Difficulty of Paying Living Expenses: Not on file  Food Insecurity:   . Worried About Charity fundraiser in the Last Year: Not on file  . Ran Out of Food in the Last Year: Not on file  Transportation Needs:   . Lack of Transportation (Medical): Not on file   . Lack of Transportation (Non-Medical): Not on file  Physical Activity:   . Days of Exercise per Week: Not on file  . Minutes of Exercise per Session: Not on file  Stress:   . Feeling of Stress : Not on file  Social Connections:   . Frequency of Communication with Friends and Family: Not on file  . Frequency of Social Gatherings with Friends and Family: Not on file  . Attends Religious Services: Not on file  . Active Member of Clubs or Organizations: Not on file  . Attends Archivist Meetings: Not on file  . Marital Status: Not on file    Outpatient Encounter Medications as of 11/14/2019  Medication Sig  . acetaminophen (TYLENOL) 500 MG tablet Take 500 mg by mouth at bedtime.   Marland Kitchen amLODipine (NORVASC) 2.5 MG tablet TAKE 1 TABLET BY MOUTH  DAILY  . aspirin 81 MG EC tablet Take 81 mg by mouth daily.   . Blood Pressure Monitoring (BLOOD PRESSURE MONITOR AUTOMAT) DEVI Use daily to check blood pressure  . Calcium Carbonate-Vitamin D (CALCIUM + D PO) Take 1 tablet by mouth daily.   . Cholecalciferol (VITAMIN D-3) 1000 UNITS CAPS Take 1 capsule by mouth daily.  . diclofenac (VOLTAREN) 75 MG EC tablet Take 1 tablet (75 mg total) by mouth 2 (two) times daily.  . diclofenac sodium (VOLTAREN) 1 % GEL APPLY 4 GRAMS TOPICALLY 4  (FOUR) TIMES DAILY.  . finasteride (PROSCAR) 5 MG tablet TAKE 1 TABLET BY MOUTH  DAILY  . fluocinonide ointment (LIDEX) 0.05 % APPLY TO AFFECTED AREA TWICE DAILY UNTIL CLEAR, THEN AS NEEDED.  Marland Kitchen ipratropium (ATROVENT) 0.03 % nasal spray   . lisinopril (ZESTRIL) 20 MG tablet TAKE 1 TABLET BY MOUTH  DAILY  . meclizine (ANTIVERT) 12.5 MG tablet TAKE 1 TABLET (12.5 MG TOTAL) BY MOUTH 3 (THREE) TIMES DAILY AS NEEDED. (Patient taking differently: Take 12.5 mg by mouth 3 (three) times daily as needed for dizziness. )  . metoprolol tartrate (LOPRESSOR) 25 MG tablet TAKE ONE-HALF TABLET BY  MOUTH TWO TIMES DAILY  . omeprazole (PRILOSEC) 20 MG capsule TAKE 1 CAPSULE BY MOUTH   DAILY  . oxybutynin (DITROPAN) 5 MG tablet TAKE 1 TABLET BY MOUTH  TWICE DAILY  . polyethylene glycol powder (GLYCOLAX/MIRALAX) powder Take 17 g by mouth once. Dissolve 1 capful of power into any liquid and drink once daily (Patient taking differently: Take 17 g by mouth at bedtime. Dissolve 1 capful of power into any liquid and drink once daily)  . rOPINIRole (REQUIP) 0.25 MG tablet TAKE 1 TABLET BY MOUTH AT  BEDTIME.  . Simethicone Extra Strength 125 MG CAPS Take 125 mg by mouth as needed (gas).  . simvastatin (ZOCOR) 20 MG tablet TAKE 1 TABLET BY  MOUTH AT  BEDTIME  . tamsulosin (FLOMAX) 0.4 MG CAPS capsule TAKE ONE CAPSULE BY MOUTH  EVERY DAY AFTER BREAKFAST  . [DISCONTINUED] cephALEXin (KEFLEX) 500 MG capsule Take 1 capsule (500 mg total) by mouth 4 (four) times daily. (Patient not taking: Reported on 11/11/2019)  . [DISCONTINUED] metoprolol succinate (TOPROL-XL) 25 MG 24 hr tablet Take by mouth.   No facility-administered encounter medications on file as of 11/14/2019.    Activities of Daily Living In your present state of health, do you have any difficulty performing the following activities: 11/14/2019 12/16/2018  Hearing? Y N  Vision? N N  Difficulty concentrating or making decisions? N N  Walking or climbing stairs? Y Y  Comment Unsteady gait, cane in use -  Dressing or bathing? N N  Doing errands, shopping? N N  Preparing Food and eating ? N -  Using the Toilet? N -  In the past six months, have you accidently leaked urine? N -  Comment Followed by Urology -  Do you have problems with loss of bowel control? N -  Managing your Medications? N -  Managing your Finances? N -  Housekeeping or managing your Housekeeping? Y -  Comment Maid assist -  Some recent data might be hidden    Patient Care Team: [name], MD as PCP - General (Internal Medicine)   Assessment:   This is a routine wellness examination for [place].  Nurse connected with patient 11/14/19 at 12:00 PM  EST by a telephone enabled telemedicine application and verified that I am speaking with the correct person using two identifiers. Patient stated full name and DOB. Patient gave permission to continue with virtual visit. Patient's location was at home and Nurse's location was at [place] office.   Health Maintenance Due: See completed HM at the end of note.   Eye: Visual acuity not assessed. Virtual visit. Wears corrective lenses. Followed by their ophthalmologist.  Dental: Dentures- yes  Hearing: Hearing aids- yes  Safety:  Patient feels safe at home- yes Patient does have smoke detectors at home- yes Patient does wear sunscreen or protective clothing when in direct sunlight - yes Patient does wear seat belt when in a moving vehicle - yes Patient drives- yes Adequate lighting in walkways free from debris- yes Grab bars and handrails used as appropriate- yes Ambulates with a cane as an assistive device Cell phone on person when ambulating outside of the home- yes  Social: Alcohol intake - no    Smoking history-  former   Smokers in home? none Illicit drug use? none  Depression: PHQ 2 &9 complete. See screening below. Denies irritability, anhedonia, sadness/tearfullness.    Falls: See screening below.    Medication: Taking as directed and without issues.   Covid-19: Precautions and sickness symptoms discussed. Wears mask, social distancing, hand hygiene as appropriate.   Activities of Daily Living Patient denies needing assistance with: household chores, feeding themselves, getting from bed to chair, getting to the toilet, bathing/showering, dressing, managing money, or preparing meals.   Memory: Patient is alert. Patient denies difficulty focusing or concentrating. Patient likes to work in his wood shed which assists with brain stimulation.   Discussed the importance of a healthy diet, water intake and the benefits of aerobic exercise.   Physical activity- walking  around the home, no routine.   Diet:  Regular Water: fair intake  Other Providers Patient Care Team: [name], MD as PCP - General (Internal Medicine)  Exercise Activities  and Dietary recommendations Current Exercise Habits: Home exercise routine, Type of exercise: walking, Intensity: Mild  Goals    . Follow up with Primary Care Provider     As needed       Fall Risk Fall Risk  11/14/2019 08/04/2019 12/27/2018 11/11/2018 08/13/2018  Falls in the past year? 0 1 1 0 No  Number falls in past yr: - 0 0 - -  Injury with Fall? - 1 1 - -  Risk Factor Category  - - - - -  Comment - - - - -  Risk for fall due to : - - - - -  Follow up Falls prevention discussed Falls evaluation completed - - -   Timed Get Up and Go Performed: no, virtual visit  Depression Screen PHQ 2/9 Scores 11/14/2019 08/04/2019 11/11/2018 11/06/2017  PHQ - 2 Score 0 0 0 0  PHQ- 9 Score - 0 - 0    Cognitive Function     6CIT Screen 11/14/2019 11/11/2018 11/06/2017 11/06/2016  What Year? 0 points 0 points 0 points 0 points  What month? 0 points 0 points 0 points 0 points  What time? 0 points 0 points 0 points 0 points  Count back from 20 0 points 0 points 0 points 0 points  Months in reverse 0 points 0 points 0 points 0 points  Repeat phrase 2 points 0 points 0 points 0 points  Total Score 2 0 0 0    Immunization History  Administered Date(s) Administered  . Fluad Quad(high Dose 65+) 08/04/2019  . Influenza Split 11/11/2012, 09/25/2014  . Influenza, High Dose Seasonal PF 10/15/2013, 10/01/2015, 08/13/2018  . Influenza-Unspecified 10/01/2016, 08/31/2017  . Pneumococcal Conjugate-13 11/25/2014, 04/03/2015  . Pneumococcal Polysaccharide-23 11/25/2012  . Tdap 08/15/2017  . Zoster 04/10/2011   Screening Tests Health Maintenance  Topic Date Due  . TETANUS/TDAP  08/16/2027  . INFLUENZA VACCINE  Completed  . PNA vac Low Risk Adult  Completed      Plan:   Keep all routine maintenance appointments.    Follow up with your doctor today @ 130  Medicare Attestation I have personally reviewed: The patient's medical and social history Their use of alcohol, tobacco or illicit drugs Their current medications and supplements The patient's functional ability including ADLs,fall risks, home safety risks, cognitive, and hearing and visual impairment Diet and physical activities Evidence for depression   I have reviewed and discussed with patient certain preventive protocols, quality metrics, and best practice recommendations.     OBrien-Blaney, Maigan Bittinger L, LPN  [id number]   I have reviewed the above information and agree with above.   Deborra Medina, MD

## 2019-11-17 ENCOUNTER — Other Ambulatory Visit: Payer: Self-pay

## 2019-11-17 ENCOUNTER — Other Ambulatory Visit (INDEPENDENT_AMBULATORY_CARE_PROVIDER_SITE_OTHER): Payer: Medicare Other

## 2019-11-17 DIAGNOSIS — E785 Hyperlipidemia, unspecified: Secondary | ICD-10-CM

## 2019-11-17 DIAGNOSIS — I10 Essential (primary) hypertension: Secondary | ICD-10-CM | POA: Diagnosis not present

## 2019-11-17 DIAGNOSIS — R7302 Impaired glucose tolerance (oral): Secondary | ICD-10-CM

## 2019-11-17 LAB — LIPID PANEL
Cholesterol: 125 mg/dL (ref 0–200)
HDL: 42 mg/dL (ref >39.00)
LDL Cholesterol: 62 mg/dL (ref 0–99)
NonHDL: 83.4
Total CHOL/HDL Ratio: 3
Triglycerides: 107 mg/dL (ref 0.0–149.0)
VLDL: 21.4 mg/dL (ref 0.0–40.0)

## 2019-11-17 LAB — COMPREHENSIVE METABOLIC PANEL
ALT: 23 U/L (ref 0–53)
AST: 23 U/L (ref 0–37)
Albumin: 4.1 g/dL (ref 3.5–5.2)
Alkaline Phosphatase: 67 U/L (ref 39–117)
BUN: 13 mg/dL (ref 6–23)
CO2: 28 mEq/L (ref 19–32)
Calcium: 9.2 mg/dL (ref 8.4–10.5)
Chloride: 107 mEq/L (ref 96–112)
Creatinine, Ser: 0.92 mg/dL (ref 0.40–1.50)
GFR: 77.55 mL/min (ref >60.00)
Glucose, Bld: 67 mg/dL — ABNORMAL LOW (ref 70–99)
Potassium: 3.9 mEq/L (ref 3.5–5.1)
Sodium: 142 mEq/L (ref 135–145)
Total Bilirubin: 0.5 mg/dL (ref 0.2–1.2)
Total Protein: 6.5 g/dL (ref 6.0–8.3)

## 2019-11-17 LAB — HEMOGLOBIN A1C: Hgb A1c MFr Bld: 5.7 % (ref 4.6–6.5)

## 2019-11-24 ENCOUNTER — Other Ambulatory Visit: Payer: Self-pay | Admitting: Internal Medicine

## 2019-12-08 DIAGNOSIS — R948 Abnormal results of function studies of other organs and systems: Secondary | ICD-10-CM | POA: Diagnosis not present

## 2019-12-08 DIAGNOSIS — R633 Feeding difficulties: Secondary | ICD-10-CM | POA: Diagnosis not present

## 2019-12-08 DIAGNOSIS — K224 Dyskinesia of esophagus: Secondary | ICD-10-CM | POA: Diagnosis not present

## 2019-12-08 DIAGNOSIS — R1314 Dysphagia, pharyngoesophageal phase: Secondary | ICD-10-CM | POA: Diagnosis not present

## 2019-12-08 DIAGNOSIS — R1312 Dysphagia, oropharyngeal phase: Secondary | ICD-10-CM | POA: Diagnosis not present

## 2020-01-13 DIAGNOSIS — D2261 Melanocytic nevi of right upper limb, including shoulder: Secondary | ICD-10-CM | POA: Diagnosis not present

## 2020-01-13 DIAGNOSIS — D2262 Melanocytic nevi of left upper limb, including shoulder: Secondary | ICD-10-CM | POA: Diagnosis not present

## 2020-01-13 DIAGNOSIS — D225 Melanocytic nevi of trunk: Secondary | ICD-10-CM | POA: Diagnosis not present

## 2020-01-13 DIAGNOSIS — Z85828 Personal history of other malignant neoplasm of skin: Secondary | ICD-10-CM | POA: Diagnosis not present

## 2020-01-13 DIAGNOSIS — B353 Tinea pedis: Secondary | ICD-10-CM | POA: Diagnosis not present

## 2020-01-13 DIAGNOSIS — L570 Actinic keratosis: Secondary | ICD-10-CM | POA: Diagnosis not present

## 2020-01-27 ENCOUNTER — Telehealth: Payer: Self-pay | Admitting: Internal Medicine

## 2020-01-27 NOTE — Telephone Encounter (Signed)
Pt's daughter Rip Harbour states that pt got the shingles vaccine on 01/26/20 and has felt sick since then. Pt started out being nauseated and generally not feeling well. Pt's daughter found him collapsed by the bed and pt urinated on himself. Pt could not get up. Spoke with Fransisco Beau and he advised to tell them to go to ED. Pt's daughter agreed that that would be best.

## 2020-03-29 ENCOUNTER — Ambulatory Visit: Payer: Medicare Other | Admitting: Allergy and Immunology

## 2020-03-29 ENCOUNTER — Encounter: Payer: Self-pay | Admitting: Allergy and Immunology

## 2020-03-29 ENCOUNTER — Other Ambulatory Visit: Payer: Self-pay

## 2020-03-29 VITALS — BP 124/82 | HR 62 | Temp 98.2°F | Resp 16 | Ht 70.0 in | Wt 200.8 lb

## 2020-03-29 DIAGNOSIS — H1013 Acute atopic conjunctivitis, bilateral: Secondary | ICD-10-CM

## 2020-03-29 DIAGNOSIS — J3089 Other allergic rhinitis: Secondary | ICD-10-CM | POA: Diagnosis not present

## 2020-03-29 DIAGNOSIS — R05 Cough: Secondary | ICD-10-CM

## 2020-03-29 DIAGNOSIS — R059 Cough, unspecified: Secondary | ICD-10-CM

## 2020-03-29 DIAGNOSIS — H1010 Acute atopic conjunctivitis, unspecified eye: Secondary | ICD-10-CM | POA: Insufficient documentation

## 2020-03-29 MED ORDER — AZELASTINE-FLUTICASONE 137-50 MCG/ACT NA SUSP: 1.0000 | Freq: Two times a day (BID) | NASAL | 5 refills | Status: DC | PRN

## 2020-03-29 MED ORDER — OLOPATADINE HCL 0.2 % OP SOLN: 1.0000 [drp] | Freq: Every day | OPHTHALMIC | 5 refills | Status: DC | PRN

## 2020-03-29 NOTE — Assessment & Plan Note (Signed)
   Treatment plan as outlined above for allergic rhinitis.  A prescription has been provided for generic Pataday, one drop per eye daily as needed.  I have also recommended eye lubricant drops (i.e., Natural Tears) as needed.

## 2020-03-29 NOTE — Assessment & Plan Note (Signed)
The patient's history and physical examination suggest upper airway cough syndrome with possible contribution from laryngopharyngeal reflux.   Treatment plan as outlined above.  If the coughing persists or progresses despite this plan, we will evaluate further.

## 2020-03-29 NOTE — Progress Notes (Signed)
New Patient Note  RE: David Atkins MRN: [id number] DOB: [birth year] Date of Office Visit: 03/29/2020  Referring provider: [name], MD Primary care provider: [name], MD  Chief Complaint: Allergic Rhinitis  and Conjunctivitis   History of present illness: David Atkins is a 86 y.o. male seen today in consultation requested by Deborra Medina, MD.  He complains of nasal congestion, rhinorrhea, sneezing, nasal pruritus, postnasal drainage, and ocular pruritus.  He reports that he moved to [place] from [place] 11 years ago and since that time has experienced these symptoms every spring.  He attempts to control the symptoms with loratadine daily and Zaditor eyedrops as needed.  He admits that he has been taking loratadine on a daily basis year-round.  He reports that other antihistamines have caused problems with his prostate.  He also reports that he experiences an occasional "coughing spasm."  He denies chest tightness, dyspnea, and/or wheezing.  He reports that the cough seems to originate from a tickle/irritation in the base of his throat.  He had been taking omeprazole daily for many years and approximately 3 or 4 months ago was increased to omeprazole 20 mg twice daily.  He reports that he recently had esophageal dilation with benefit.  Assessment and plan: Allergic rhinitis  Aeroallergen avoidance measures have been discussed and provided in written form.  A prescription has been provided for azelastine/fluticasone nasal spray, 1 spray per nostril twice daily as needed. Proper nasal spray technique has been discussed and demonstrated.  Nasal saline spray (i.e., Simply Saline) or nasal saline lavage (i.e., NeilMed) is recommended as needed and prior to medicated nasal sprays.  To avoid diminishing benefit with daily use (tachyphylaxis) of second generation antihistamine, consider using loratadine (Claritin) as needed rather than on a daily basis.  For thick post nasal  drainage, add guaifenesin 600 mg (Mucinex)  twice daily as needed with adequate hydration as discussed.  Given the difficulty in swallowing large pills/tablets, consider taking the liquid form of this medication.  Allergic conjunctivitis  Treatment plan as outlined above for allergic rhinitis.  A prescription has been provided for generic Pataday, one drop per eye daily as needed.  I have also recommended eye lubricant drops (i.e., Natural Tears) as needed.  Coughing The patient's history and physical examination suggest upper airway cough syndrome with possible contribution from laryngopharyngeal reflux.   Treatment plan as outlined above.  If the coughing persists or progresses despite this plan, we will evaluate further.   Meds ordered this encounter  Medications  . Azelastine-Fluticasone 137-50 MCG/ACT SUSP    Sig: Place 1 spray into both nostrils 2 (two) times daily as needed.    Dispense:  23 g    Refill:  5  . Olopatadine HCl (PATADAY) 0.2 % SOLN    Sig: Place 1 drop into both eyes daily as needed.    Dispense:  2.5 mL    Refill:  5    Diagnostics: Epicutaneous testing: Positive to molds. Intradermal testing: Positive to grass pollen, weed pollen, and borderline positive to dog epithelia.  Physical examination: Blood pressure 124/82, pulse 62, temperature 98.2 F (36.8 C), temperature source Temporal, resp. rate 16, height 5\' 10"  (1.778 m), weight 200 lb 12.8 oz (91.1 kg), SpO2 98 %.  General: Alert, interactive, in no acute distress. HEENT: TMs pearly gray, turbinates moderately edematous without discharge, post-pharynx erythematous. Neck: Supple without lymphadenopathy. Lungs: Clear to auscultation without wheezing, rhonchi or rales. CV: Normal S1, S2 without murmurs. Abdomen:  Nondistended, nontender. Skin: Warm and dry, without lesions or rashes. Extremities:  No clubbing, cyanosis or edema. Neuro:   Grossly intact.  Review of systems:  Review of systems  negative except as noted in HPI / PMHx or noted below: Review of Systems  Constitutional: Negative.   HENT: Negative.   Eyes: Negative.   Respiratory: Negative.   Cardiovascular: Negative.   Gastrointestinal: Negative.   Genitourinary: Negative.   Musculoskeletal: Negative.   Skin: Negative.   Neurological: Negative.   Endo/Heme/Allergies: Negative.   Psychiatric/Behavioral: Negative.     Past medical history:  Past Medical History:  Diagnosis Date  . 3-vessel coronary artery disease 2006   [place]  . basal cell   . BPH (benign prostatic hyperplasia)   . GERD (gastroesophageal reflux disease)   . Hyperlipidemia   . Hypertension   . Lumbar stenosis 12/03/13   MRI - lumbar pain with radiation down to LE  . Myocardial infarction ([place]) 2006  . OAB (overactive bladder)   . Osteoporosis 2010   by [facility] at [facility]  . Sleep apnea   . Spondylolisthesis of lumbar region 11/23/13   MRI Lumbar spine    Past surgical history:  Past Surgical History:  Procedure Laterality Date  . APPENDECTOMY  1951  . COLONOSCOPY    . COLONOSCOPY WITH PROPOFOL N/A 11/15/2015   Procedure: COLONOSCOPY WITH PROPOFOL;  Surgeon: Hulen Luster, MD;  Location: [facility];  Service: Gastroenterology;  Laterality: N/A;  . CORONARY ARTERY BYPASS GRAFT  2006   3 vessel, s/p AMI  . NASAL SEPTOPLASTY W/ TURBINOPLASTY  1981  . TONSILLECTOMY      Family history: Family History  Problem Relation Age of Onset  . Hyperlipidemia Mother   . Hypertension Mother   . Hypertension Father     Social history: Social History   Socioeconomic History  . Marital status: Married    Spouse name: Not on file  . Number of children: Not on file  . Years of education: Not on file  . Highest education level: Not on file  Occupational History  . Not on file  Tobacco Use  . Smoking status: Former Smoker    Types: Cigarettes    Quit date: 07/10/1963    Years since quitting: 56.7  . Smokeless tobacco: Never Used   Substance and Sexual Activity  . Alcohol use: No  . Drug use: No  . Sexual activity: Not Currently  Other Topics Concern  . Not on file  Social History Narrative  . Not on file   Social Determinants of Health   Financial Resource Strain:   . Difficulty of Paying Living Expenses:   Food Insecurity:   . Worried About Charity fundraiser in the Last Year:   . Arboriculturist in the Last Year:   Transportation Needs:   . Film/video editor (Medical):   Marland Kitchen Lack of Transportation (Non-Medical):   Physical Activity:   . Days of Exercise per Week:   . Minutes of Exercise per Session:   Stress:   . Feeling of Stress :   Social Connections:   . Frequency of Communication with Friends and Family:   . Frequency of Social Gatherings with Friends and Family:   . Attends Religious Services:   . Active Member of Clubs or Organizations:   . Attends Archivist Meetings:   Marland Kitchen Marital Status:   Intimate Partner Violence:   . Fear of Current or Ex-Partner:   . Emotionally  Abused:   Marland Kitchen Physically Abused:   . Sexually Abused:     Environmental History: The patient lives in 84 year old house with carpeting in the bedroom, gassy, and central air.  There is no known mold/water damage in the home.  There is a dog in the home which has access to his bedroom.  He is a former cigarette smoker having started 43 and quit in 1962.  Current Outpatient Medications  Medication Sig Dispense Refill  . acetaminophen (TYLENOL) 500 MG tablet Take 500 mg by mouth at bedtime.     Marland Kitchen amLODipine (NORVASC) 2.5 MG tablet TAKE 1 TABLET BY MOUTH  DAILY 90 tablet 3  . aspirin 81 MG EC tablet Take 81 mg by mouth daily.     . Blood Pressure Monitoring (BLOOD PRESSURE MONITOR AUTOMAT) DEVI Use daily to check blood pressure 1 Device 0  . cholecalciferol (VITAMIN D) 25 MCG (1000 UNIT) tablet     . finasteride (PROSCAR) 5 MG tablet TAKE 1 TABLET BY MOUTH  DAILY 90 tablet 3  . lisinopril (ZESTRIL) 20 MG tablet  TAKE 1 TABLET BY MOUTH  DAILY 90 tablet 3  . metoprolol tartrate (LOPRESSOR) 25 MG tablet TAKE ONE-HALF TABLET BY  MOUTH TWICE DAILY 90 tablet 3  . omeprazole (PRILOSEC) 20 MG capsule Take 2 capsules (40 mg total) by mouth daily. 180 capsule 2  . oxybutynin (DITROPAN) 5 MG tablet TAKE 1 TABLET BY MOUTH  TWICE DAILY 180 tablet 3  . polyethylene glycol powder (GLYCOLAX/MIRALAX) powder Take 17 g by mouth once. Dissolve 1 capful of power into any liquid and drink once daily (Patient taking differently: Take 17 g by mouth at bedtime. Dissolve 1 capful of power into any liquid and drink once daily) 500 g 0  . rOPINIRole (REQUIP) 0.25 MG tablet TAKE 1 TABLET BY MOUTH AT  BEDTIME. 90 tablet 3  . Simethicone Extra Strength 125 MG CAPS Take 125 mg by mouth as needed (gas).    . simvastatin (ZOCOR) 20 MG tablet TAKE 1 TABLET BY MOUTH AT  BEDTIME 90 tablet 3  . tamsulosin (FLOMAX) 0.4 MG CAPS capsule TAKE ONE CAPSULE BY MOUTH  EVERY DAY AFTER BREAKFAST 90 capsule 3  . Azelastine-Fluticasone 137-50 MCG/ACT SUSP Place 1 spray into both nostrils 2 (two) times daily as needed. 23 g 5  . Olopatadine HCl (PATADAY) 0.2 % SOLN Place 1 drop into both eyes daily as needed. 2.5 mL 5   No current facility-administered medications for this visit.    Known medication allergies: Allergies  Allergen Reactions  . Sulfa Antibiotics Rash    I appreciate the opportunity to take part in Adarryl's care. Please do not hesitate to contact me with questions.  Sincerely,   R. Edgar Frisk, MD

## 2020-03-29 NOTE — Patient Instructions (Addendum)
Allergic rhinitis  Aeroallergen avoidance measures have been discussed and provided in written form.  A prescription has been provided for azelastine/fluticasone nasal spray, 1 spray per nostril twice daily as needed. Proper nasal spray technique has been discussed and demonstrated.  Nasal saline spray (i.e., Simply Saline) or nasal saline lavage (i.e., NeilMed) is recommended as needed and prior to medicated nasal sprays.  To avoid diminishing benefit with daily use (tachyphylaxis) of second generation antihistamine, consider using loratadine (Claritin) as needed rather than on a daily basis.  For thick post nasal drainage, add guaifenesin 600 mg (Mucinex)  twice daily as needed with adequate hydration as discussed.  Given the difficulty in swallowing large pills/tablets, consider taking the liquid form of this medication.  Allergic conjunctivitis  Treatment plan as outlined above for allergic rhinitis.  A prescription has been provided for generic Pataday, one drop per eye daily as needed.  I have also recommended eye lubricant drops (i.e., Natural Tears) as needed.  Coughing The patient's history and physical examination suggest upper airway cough syndrome with possible contribution from laryngopharyngeal reflux.   Treatment plan as outlined above.  If the coughing persists or progresses despite this plan, we will evaluate further.   If needed, for follow-up return in approximately 4 months.  Control of Dust Mite Allergen  House dust mites play a major role in allergic asthma and rhinitis.  They occur in environments with high humidity wherever human skin, the food for dust mites is found. High levels have been detected in dust obtained from mattresses, pillows, carpets, upholstered furniture, bed covers, clothes and soft toys.  The principal allergen of the house dust mite is found in its feces.  A gram of dust may contain 1,000 mites and 250,000 fecal particles.  Mite antigen is  easily measured in the air during house cleaning activities.    1. Encase mattresses, including the box spring, and pillow, in an air tight cover.  Seal the zipper end of the encased mattresses with wide adhesive tape. 2. Wash the bedding in water of 130 degrees Farenheit weekly.  Avoid cotton comforters/quilts and flannel bedding: the most ideal bed covering is the dacron comforter. 3. Remove all upholstered furniture from the bedroom. 4. Remove carpets, carpet padding, rugs, and non-washable window drapes from the bedroom.  Wash drapes weekly or use plastic window coverings. 5. Remove all non-washable stuffed toys from the bedroom.  Wash stuffed toys weekly. 6. Have the room cleaned frequently with a vacuum cleaner and a damp dust-mop.  The patient should not be in a room which is being cleaned and should wait 1 hour after cleaning before going into the room. 7. Close and seal all heating outlets in the bedroom.  Otherwise, the room will become filled with dust-laden air.  An electric heater can be used to heat the room. Reduce indoor humidity to less than 50%.  Do not use a humidifier.   Reducing Pollen Exposure  The American Academy of Allergy, Asthma and Immunology suggests the following steps to reduce your exposure to pollen during allergy seasons.    1. Do not hang sheets or clothing out to dry; pollen may collect on these items. 2. Do not mow lawns or spend time around freshly cut grass; mowing stirs up pollen. 3. Keep windows closed at night.  Keep car windows closed while driving. 4. Minimize morning activities outdoors, a time when pollen counts are usually at their highest. 5. Stay indoors as much as possible when pollen counts  or humidity is high and on windy days when pollen tends to remain in the air longer. 6. Use air conditioning when possible.  Many air conditioners have filters that trap the pollen spores. 7. Use a HEPA room air filter to remove pollen form the indoor air  you breathe.   Control of Dog or Cat Allergen  Avoidance is the best way to manage a dog or cat allergy. If you have a dog or cat and are allergic to dog or cats, consider removing the dog or cat from the home. If you have a dog or cat but don't want to find it a new home, or if your family wants a pet even though someone in the household is allergic, here are some strategies that may help keep symptoms at bay:  1. Keep the pet out of your bedroom and restrict it to only a few rooms. Be advised that keeping the dog or cat in only one room will not limit the allergens to that room. 2. Don't pet, hug or kiss the dog or cat; if you do, wash your hands with soap and water. 3. High-efficiency particulate air (HEPA) cleaners run continuously in a bedroom or living room can reduce allergen levels over time. 4. Place electrostatic material sheet in the air inlet vent in the bedroom. 5. Regular use of a high-efficiency vacuum cleaner or a central vacuum can reduce allergen levels. 6. Giving your dog or cat a bath at least once a week can reduce airborne allergen.   Control of Mold Allergen  Mold and fungi can grow on a variety of surfaces provided certain temperature and moisture conditions exist.  Outdoor molds grow on plants, decaying vegetation and soil.  The major outdoor mold, Alternaria and Cladosporium, are found in very high numbers during hot and dry conditions.  Generally, a late Summer - Fall peak is seen for common outdoor fungal spores.  Rain will temporarily lower outdoor mold spore count, but counts rise rapidly when the rainy period ends.  The most important indoor molds are Aspergillus and Penicillium.  Dark, humid and poorly ventilated basements are ideal sites for mold growth.  The next most common sites of mold growth are the bathroom and the kitchen.  Outdoor [employer] 1. Use air conditioning and keep windows closed 2. Avoid exposure to decaying vegetation. 3. Avoid leaf  raking. 4. Avoid grain handling. 5. Consider wearing a face mask if working in moldy areas.  Indoor Mold Control 1. Maintain humidity below 50%. 2. Clean washable surfaces with 5% bleach solution. 3. Remove sources e.g. Contaminated carpets.

## 2020-03-29 NOTE — Assessment & Plan Note (Addendum)
   Aeroallergen avoidance measures have been discussed and provided in written form.  A prescription has been provided for azelastine/fluticasone nasal spray, 1 spray per nostril twice daily as needed. Proper nasal spray technique has been discussed and demonstrated.  Nasal saline spray (i.e., Simply Saline) or nasal saline lavage (i.e., NeilMed) is recommended as needed and prior to medicated nasal sprays.  To avoid diminishing benefit with daily use (tachyphylaxis) of second generation antihistamine, consider using loratadine (Claritin) as needed rather than on a daily basis.  For thick post nasal drainage, add guaifenesin 600 mg (Mucinex)  twice daily as needed with adequate hydration as discussed.  Given the difficulty in swallowing large pills/tablets, consider taking the liquid form of this medication.

## 2020-04-06 ENCOUNTER — Telehealth: Payer: Self-pay | Admitting: *Deleted

## 2020-04-06 ENCOUNTER — Telehealth: Payer: Self-pay | Admitting: Internal Medicine

## 2020-04-06 NOTE — Telephone Encounter (Signed)
They should not raise his BP,  They are antihistamines

## 2020-04-06 NOTE — Telephone Encounter (Signed)
Spoke with pt and informed him that Dr. Derrel Nip did agree that those medications should not elevate his bp because they are antihistamines. Pt stated that he would try the medications again and let us know if they elevate his blood pressure again.

## 2020-04-06 NOTE — Telephone Encounter (Signed)
Called and advised to the patient. Patient verbalized understanding and will follow up with his PCP.

## 2020-04-06 NOTE — Telephone Encounter (Signed)
Pt called and said that he saw Dr.Carter on 03/29/20 and was started on two new medications and he has been having high BP readings since starting the medication. He said he has gotten it under control now but is taking more BP medication  Patient wanted to know what he needed to do

## 2020-04-06 NOTE — Telephone Encounter (Signed)
Spoke with pt and he stated that he saw Dr. Starling Manns, Allergist, on Monday April 26th. He was prescribed olopatadine eye drops and astelin nasal spray. Pt stated that he got around to starting those medications this past Saturday and and by Sunday night his blood pressure was elevated at 200/99. Pt stated that his normal blood pressure runs around 135/80 so he took an extra 1/2 tablet of metoprolol equaling 3 1/2 tablets for Sunday and he did the same thing again Monday morning. Sunday was the last day he used both the eye drops and the nasal spray because he wasn't sure if that was what elevated his blood pressure. Today pt stated that his blood pressure is back down to 138/83 taking his metoprolol normal which I 1/2 tablet twice daily. Pt stated that he also reached out to Dr. Starling Manns and he told pt that he didn't think that those medications would elevate is blood pressure and that the pt needed to reach out to his PCP. Dr. Starling Manns also wanted the pt to give the medication a try again and see if it elevates the bp again but pt wants your opinion.

## 2020-04-06 NOTE — Telephone Encounter (Signed)
Elevated blood pressure is not a known side effect of any of these medications.  The timing is most likely coincidental.  However, if the patient is concerned about he may hold these medications for several days and restart to see if there is a correlation.  However, the most important issue is the management of his antihypertensive medication. Thanks.

## 2020-04-06 NOTE — Telephone Encounter (Signed)
Patient called stating that he started using generic Dymista and generic Pataday last week as prescribed from Thursday through Sunday. He states that he noticed Sunday that his blood pressure was elevated to 200/100 and he was concerned that maybe these new medication could have cause the increase in his blood pressure. He states that he added on more blood pressure medication and got his pressure to come down. He states that he did not call his PCP about this who manages his BP. Advised to please call his PCP and inform them of his elevation in BP to see if they need to change his BP medication. Patient verbalized understanding and will call his PCP to follow up. Please advise if these medications may have caused an increase in blood pressure. Thank You.

## 2020-04-16 ENCOUNTER — Ambulatory Visit: Payer: Medicare Other | Admitting: Urology

## 2020-04-16 ENCOUNTER — Encounter: Payer: Self-pay | Admitting: Urology

## 2020-04-16 ENCOUNTER — Other Ambulatory Visit: Payer: Self-pay

## 2020-04-16 VITALS — BP 166/78 | HR 57 | Ht 70.0 in | Wt 200.0 lb

## 2020-04-16 DIAGNOSIS — N138 Other obstructive and reflux uropathy: Secondary | ICD-10-CM

## 2020-04-16 DIAGNOSIS — N401 Enlarged prostate with lower urinary tract symptoms: Secondary | ICD-10-CM | POA: Diagnosis not present

## 2020-04-16 DIAGNOSIS — R3915 Urgency of urination: Secondary | ICD-10-CM | POA: Diagnosis not present

## 2020-04-16 NOTE — Patient Instructions (Signed)

## 2020-04-16 NOTE — Progress Notes (Signed)
04/16/2020 1:10 PM   David Atkins 08-Feb-1929 [id number]  Referring provider: [name], MD [place],  [place] [address]  Chief Complaint  Patient presents with  . Benign Prostatic Hypertrophy    HPI:  David Atkins is a 86 year-old male patient who was referred by Dr. Deborra Medina, MD who is here for symptoms of enlarged prostate.  He first noticed the symptoms approximately 04/03/2008. His symptoms have not gotten worse over the last year. He has been treated with Flomax and Proscar. The patient has never had a surgical procedure for bladder outlet obstruction to his prostate.   H/o BPH with LUTS for many years on tamsulosin and tolterodine combo. PVR was 89 in 2017. PVR was 155 mL Feb 2018 and 173 ml.  DRE was normal. Good flow, Noc x 2-3 towards the AM. No frequency or urgency. He underwent CT Sep 2017 for flank pain which was normal. Prostate measured ~ 60 grams. Finasteride was added. PVR 38 ml. Avoided spicy foods to help frequency.   He is maintained on tamsulosin, oxybutynin and finasteride. His medicines are working pretty well. He sleeps soundly for 9 hours a night. He might leak a little urine if he goes that long. No dysuria, gross hematuria or UTI.   PMH: Past Medical History:  Diagnosis Date  . 3-vessel coronary artery disease 2006   [place]  . basal cell   . BPH (benign prostatic hyperplasia)   . GERD (gastroesophageal reflux disease)   . Hyperlipidemia   . Hypertension   . Lumbar stenosis 12/03/13   MRI - lumbar pain with radiation down to LE  . Myocardial infarction ([place]) 2006  . OAB (overactive bladder)   . Osteoporosis 2010   by DEXA at [facility]  . Sleep apnea   . Spondylolisthesis of lumbar region 11/23/13   MRI Lumbar spine    Surgical History: Past Surgical History:  Procedure Laterality Date  . APPENDECTOMY  1951  . COLONOSCOPY    . COLONOSCOPY WITH PROPOFOL N/A 11/15/2015   Procedure: COLONOSCOPY WITH PROPOFOL;   Surgeon: Hulen Luster, MD;  Location: [facility] ENDOSCOPY;  Service: Gastroenterology;  Laterality: N/A;  . CORONARY ARTERY BYPASS GRAFT  2006   3 vessel, s/p AMI  . NASAL SEPTOPLASTY W/ TURBINOPLASTY  1981  . TONSILLECTOMY      Home Medications:  Allergies as of 04/16/2020      Reactions   Sulfa Antibiotics Rash      Medication List       Accurate as of Apr 16, 2020  1:10 PM. If you have any questions, ask your nurse or doctor.        acetaminophen 500 MG tablet Commonly known as: TYLENOL Take 500 mg by mouth at bedtime.   amLODipine 2.5 MG tablet Commonly known as: NORVASC TAKE 1 TABLET BY MOUTH  DAILY   aspirin 81 MG EC tablet Take 81 mg by mouth daily.   Azelastine-Fluticasone 137-50 MCG/ACT Susp Place 1 spray into both nostrils 2 (two) times daily as needed.   Blood Pressure Monitor Automat Devi Use daily to check blood pressure   cholecalciferol 25 MCG (1000 UNIT) tablet Commonly known as: VITAMIN D   finasteride 5 MG tablet Commonly known as: PROSCAR TAKE 1 TABLET BY MOUTH  DAILY   lisinopril 20 MG tablet Commonly known as: ZESTRIL TAKE 1 TABLET BY MOUTH  DAILY   metoprolol tartrate 25 MG tablet Commonly known as: LOPRESSOR TAKE ONE-HALF TABLET BY  MOUTH TWICE DAILY   Olopatadine HCl 0.2 % Soln Commonly known as: Pataday Place 1 drop into both eyes daily as needed.   omeprazole 20 MG capsule Commonly known as: PRILOSEC Take 2 capsules (40 mg total) by mouth daily.   oxybutynin 5 MG tablet Commonly known as: DITROPAN TAKE 1 TABLET BY MOUTH  TWICE DAILY   polyethylene glycol powder 17 GM/SCOOP powder Commonly known as: GLYCOLAX/MIRALAX Take 17 g by mouth once. Dissolve 1 capful of power into any liquid and drink once daily What changed: when to take this   rOPINIRole 0.25 MG tablet Commonly known as: REQUIP TAKE 1 TABLET BY MOUTH AT  BEDTIME.   Simethicone Extra Strength 125 MG Caps Take 125 mg by mouth as needed (gas).   simvastatin 20 MG  tablet Commonly known as: ZOCOR TAKE 1 TABLET BY MOUTH AT  BEDTIME   tamsulosin 0.4 MG Caps capsule Commonly known as: FLOMAX TAKE ONE CAPSULE BY MOUTH  EVERY DAY AFTER BREAKFAST       Allergies:  Allergies  Allergen Reactions  . Sulfa Antibiotics Rash    Family History: Family History  Problem Relation Age of Onset  . Hyperlipidemia Mother   . Hypertension Mother   . Hypertension Father     Social History:  reports that he quit smoking about 56 years ago. His smoking use included cigarettes. He has never used smokeless tobacco. He reports that he does not drink alcohol or use drugs.   Physical Exam: BP (!) 166/78   Pulse (!) 57   Ht 5\' 10"  (1.778 m)   Wt 200 lb (90.7 kg)   BMI 28.70 kg/m   Constitutional:  Alert and oriented, No acute distress. HEENT: [place] AT, moist mucus membranes.  Trachea midline, no masses. Cardiovascular: No clubbing, cyanosis, or edema. Respiratory: Normal respiratory effort, no increased work of breathing. GI: Abdomen is soft, nontender, nondistended, no abdominal masses GU: No CVA tenderness Skin: No rashes, bruises or suspicious lesions. Neurologic: Grossly intact, no focal deficits, moving all 4 extremities. Psychiatric: Normal mood and affect.  Laboratory Data: Lab Results  Component Value Date   WBC 8.9 12/15/2018   HGB 15.2 12/15/2018   HCT 45.4 12/15/2018   MCV 97.2 12/15/2018   PLT 207 12/15/2018    Lab Results  Component Value Date   CREATININE 0.92 11/17/2019    No results found for: PSA  No results found for: TESTOSTERONE  Lab Results  Component Value Date   HGBA1C 5.7 11/17/2019    Urinalysis    Component Value Date/Time   COLORURINE YELLOW (A) 12/16/2018 0050   APPEARANCEUR CLEAR (A) 12/16/2018 0050   APPEARANCEUR Clear 03/30/2017 1457   LABSPEC >1.046 (H) 12/16/2018 0050   PHURINE 6.0 12/16/2018 0050   GLUCOSEU NEGATIVE 12/16/2018 0050   GLUCOSEU NEGATIVE 08/22/2018 1450   HGBUR NEGATIVE 12/16/2018  0050   BILIRUBINUR NEGATIVE 12/16/2018 0050   BILIRUBINUR Negative 03/30/2017 1457   KETONESUR 5 (A) 12/16/2018 0050   PROTEINUR NEGATIVE 12/16/2018 0050   UROBILINOGEN 0.2 08/22/2018 1450   NITRITE NEGATIVE 12/16/2018 0050   LEUKOCYTESUR NEGATIVE 12/16/2018 0050   LEUKOCYTESUR Negative 03/30/2017 1457    Lab Results  Component Value Date   LABMICR See below: 01/09/2017   WBCUA 0-5 01/09/2017   RBCUA 0-2 01/09/2017   LABEPIT None seen 01/09/2017   MUCUS Present (A) 01/09/2017   BACTERIA NONE SEEN 12/16/2018    Pertinent Imaging: n/a No results found for this or any previous visit. No results found  for this or any previous visit. No results found for this or any previous visit. No results found for this or any previous visit. No results found for this or any previous visit. No results found for this or any previous visit. No results found for this or any previous visit. Results for orders placed in visit on 09/01/16  CT RENAL STONE STUDY   Narrative CLINICAL DATA:  Right flank pain for 3 days.  EXAM: CT ABDOMEN AND PELVIS WITHOUT CONTRAST  TECHNIQUE: Multidetector CT imaging of the abdomen and pelvis was performed following the standard protocol without IV contrast.  COMPARISON:  CT scan of October 31, 2015.  FINDINGS: Lower chest: Visualized lung bases are unremarkable.  Hepatobiliary: Several small gallstones are noted in neck of gallbladder. Stable hepatic cysts are noted.  Pancreas: Normal.  Spleen: Normal.  Adrenals/Urinary Tract: Adrenal glands are unremarkable. No hydronephrosis or renal obstruction is noted. Bilateral renal cortical scarring is noted. No renal or ureteral calculi are noted. Urinary bladder appears normal.  Stomach/Bowel: There is no evidence of bowel obstruction.  Vascular/Lymphatic: Atherosclerosis of abdominal aorta is noted without aneurysm formation. No significant adenopathy is noted.  Reproductive: Mild prostatic  enlargement is noted and stable.  Other: No abnormal fluid collection is noted.  Musculoskeletal: Old T12 compression fracture is noted. Degenerative changes seen involving the posterior facet joints of L4-5 and L5-S1.  IMPRESSION: Mild cholelithiasis.  Aortic atherosclerosis.  Stable mild prostatic enlargement.  No hydronephrosis or renal obstruction is noted. No renal or ureteral calculi is noted.   Electronically Signed   By: Marijo Conception, M.D.   On: 09/01/2016 16:04     Assessment & Plan:    BPH with LUTS - stable on oxybutynin, tamsulosin and finasteride. Continue.   No follow-ups on file.  Festus Aloe, MD  [facility] [address] [place], [place] [address] [phone]

## 2020-05-10 NOTE — Progress Notes (Signed)
Home Cardiology Office Note  Date:  05/11/2020   ID:  David Atkins, DOB May 23, 1929, MRN [id number]  PCP:  David Mc, MD   Chief Complaint  Patient presents with  . office visit    Pt is concerned w/ elevated BP, has been having an headache, swelling in legs. Meds verbally reviewed w/ pt.    HPI:  David Atkins is a very pleasant 86 year old gentleman with a history of  coronary artery disease, bypass surgery in 2006 at [facility] in [place],  mild carotid arterial disease by report,  hyperlipidemia,  small smoking history in the remote past,  aortic athero on CT 08/2018 who presents for routine followup of his coronary artery disease .   Last seen in clinic December 2018  He reports that he has been doing relatively well but does acknowledge having 2 episodes of recent BP spiking, 200/100 Reports he had a recent appointment with allergy MD, Does claritin, typically for his allergies He was given a new nasal spray to try and eyedrops  Reports that shortly after trying these medications he had a spike in his systolic pressure as detailed above He has had mild residual h/a, took tylenol  Typically blood pressure well controlled Review of the medication indicates he was given as well as Azelastine-fluticasone Review of this medication on the web with him today shows that 1 possible side effect can be hypertension likely from vasoconstriction activity  Active at baseline, no exercise program but 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  He did fall and hit his head , memory difficulty related to the events  in the waiting room  in the ED, he nearly passed out again and appeared very pale.  Normal labs It does not appear that any medication changes were made at discharge Etiology unclear, possibly vasovagal   01/06/2014, he reported that he had general malaise, blood pressure checked showed systolic pressure greater than 200. He went to the emergency room for further evaluation. Workup there was negative including normal basic metabolic panel, negative cardiac enzymes. CT scan showed chronic microvascular changes in the deep white matter, and he was discharged home after medical management of his blood pressure. On a previous visit, his Son was concerned about various arguments he may be having with other family members at home and the stress this may be causing him.    PMH:   has a past medical history of 3-vessel coronary artery disease (2006), basal cell, BPH (benign prostatic hyperplasia), GERD (gastroesophageal reflux disease), Hyperlipidemia, Hypertension, Lumbar stenosis (12/03/13), Myocardial infarction ([place]) (2006), OAB (overactive bladder), Osteoporosis (2010), Sleep apnea, and Spondylolisthesis of lumbar region (11/23/13).  PSH:    Past Surgical History:  Procedure Laterality Date  . APPENDECTOMY  1951  . COLONOSCOPY    . COLONOSCOPY WITH PROPOFOL N/A 11/15/2015   Procedure: COLONOSCOPY WITH PROPOFOL;  Surgeon: Hulen Luster, MD;  Location: [facility] ENDOSCOPY;  Service: Gastroenterology;  Laterality: N/A;  . CORONARY ARTERY BYPASS  GRAFT  2006   3 vessel, s/p AMI  . NASAL SEPTOPLASTY W/ TURBINOPLASTY  1981  . TONSILLECTOMY      Current Outpatient Medications  Medication Sig Dispense Refill  . acetaminophen (TYLENOL) 500 MG tablet Take 500 mg by mouth at bedtime.     Marland Kitchen amLODipine (NORVASC) 2.5 MG tablet TAKE 1 TABLET BY  MOUTH  DAILY (Patient taking differently: Taking at bedtime) 90 tablet 3  . aspirin 81 MG EC tablet Take 81 mg by mouth daily.     . Blood Pressure Monitoring (BLOOD PRESSURE MONITOR AUTOMAT) DEVI Use daily to check blood pressure 1 Device 0  . cholecalciferol (VITAMIN D) 25 MCG (1000 UNIT) tablet     . DICLOFENAC PO Take by mouth. As needed for pain    . finasteride (PROSCAR) 5 MG tablet TAKE 1 TABLET BY MOUTH  DAILY 90 tablet 3  . lisinopril (ZESTRIL) 20 MG tablet TAKE 1 TABLET BY MOUTH  DAILY 90 tablet 3  . metoprolol tartrate (LOPRESSOR) 25 MG tablet TAKE ONE-HALF TABLET BY  MOUTH TWICE DAILY 90 tablet 3  . omeprazole (PRILOSEC) 20 MG capsule Take 2 capsules (40 mg total) by mouth daily. 180 capsule 2  . oxybutynin (DITROPAN) 5 MG tablet TAKE 1 TABLET BY MOUTH  TWICE DAILY 180 tablet 3  . polyethylene glycol powder (GLYCOLAX/MIRALAX) powder Take 17 g by mouth once. Dissolve 1 capful of power into any liquid and drink once daily (Patient taking differently: Take 17 g by mouth at bedtime. Dissolve 1 capful of power into any liquid and drink once daily) 500 g 0  . rOPINIRole (REQUIP) 0.25 MG tablet TAKE 1 TABLET BY MOUTH AT  BEDTIME. 90 tablet 3  . Simethicone Extra Strength 125 MG CAPS Take 125 mg by mouth as needed (gas).    . simvastatin (ZOCOR) 20 MG tablet TAKE 1 TABLET BY MOUTH AT  BEDTIME 90 tablet 3  . tamsulosin (FLOMAX) 0.4 MG CAPS capsule TAKE ONE CAPSULE BY MOUTH  EVERY DAY AFTER BREAKFAST 90 capsule 3  . Azelastine-Fluticasone 137-50 MCG/ACT SUSP Place 1 spray into both nostrils 2 (two) times daily as needed. (Patient not taking: Reported on 05/11/2020) 23 g 5  . Olopatadine HCl (PATADAY) 0.2 % SOLN Place 1 drop into both eyes daily as needed. (Patient not taking: Reported on 05/11/2020) 2.5 mL 5   No current facility-administered medications for this visit.     Allergies:   Sulfa antibiotics   Social History:  The patient  reports that he quit smoking about 56 years ago. His  smoking use included cigarettes. He has never used smokeless tobacco. He reports that he does not drink alcohol or use drugs.   Family History:   family history includes Hyperlipidemia in his mother; Hypertension in his father and mother.    Review of Systems: Review of Systems  Constitutional: Negative.   Respiratory: Negative.   Cardiovascular: Negative.   Gastrointestinal: Negative.   Musculoskeletal: Positive for back pain.  Neurological: Negative.   Psychiatric/Behavioral: Negative.   All other systems reviewed and are negative.    PHYSICAL EXAM: VS:  BP 126/68 (BP Location: Left Arm, Patient Position: Sitting, Cuff Size: Normal)   Pulse 65   Ht 5\' 10"  (1.778 m)   Wt 198 lb 8 oz (90 kg)   SpO2 95%   BMI 28.48 kg/m  , BMI Body mass index is 28.48 kg/m. GEN: Well nourished, well developed, in no acute distress  HEENT: normal  Neck: no JVD,  carotid bruits, or masses Cardiac: RRR; no murmurs, rubs, or gallops,no edema  Respiratory:  clear to auscultation bilaterally, normal work of breathing GI: soft, nontender, nondistended, + BS MS: no deformity or atrophy  Skin: warm and dry, no rash Neuro:  Strength and sensation are intact Psych: euthymic mood, full affect   Recent Labs: 11/17/2019: ALT 23; BUN 13; Creatinine, Ser 0.92; Potassium 3.9; Sodium 142    Lipid Panel Lab Results  Component Value Date   CHOL 125 11/17/2019   HDL 42.00 11/17/2019   LDLCALC 62 11/17/2019   TRIG 107.0 11/17/2019      Wt Readings from Last 3 Encounters:  05/11/20 198 lb 8 oz (90 kg)  04/16/20 200 lb (90.7 kg)  03/29/20 200 lb 12.8 oz (91.1 kg)       ASSESSMENT AND PLAN:  3-vessel coronary artery disease - Plan: EKG 12-Lead Currently with no symptoms of angina. No further workup at this time. Continue current medication regimen.  Essential hypertension - Plan: EKG 12-Lead In general blood pressure well controlled He does report spikes in pressure after using his nasal  spray Recommend he avoid the nasal spray in the future  Hyperlipidemia LDL goal <70 No changes made, numbers at goal  Bilateral carotid artery stenosis History of mild bilateral carotid disease Cholesterol numbers at goal .  Hx of CABG Stable,  no new testing   Total encounter time more than 25 minutes  Greater than 50% was spent in counseling and coordination of care with the patient   Disposition:   F/U  12 months   Orders Placed This Encounter  Procedures  . EKG 12-Lead     Signed, Esmond Plants, M.D., Ph.D. 05/11/2020  [place], [place]

## 2020-05-11 ENCOUNTER — Other Ambulatory Visit: Payer: Self-pay

## 2020-05-11 ENCOUNTER — Encounter: Payer: Self-pay | Admitting: Cardiovascular Disease

## 2020-05-11 ENCOUNTER — Ambulatory Visit: Payer: Medicare Other | Admitting: Cardiovascular Disease

## 2020-05-11 VITALS — BP 126/68 | HR 65 | Ht 70.0 in | Wt 198.5 lb

## 2020-05-11 DIAGNOSIS — I25118 Atherosclerotic heart disease of native coronary artery with other forms of angina pectoris: Secondary | ICD-10-CM | POA: Diagnosis not present

## 2020-05-11 DIAGNOSIS — R55 Syncope and collapse: Secondary | ICD-10-CM

## 2020-05-11 DIAGNOSIS — I739 Peripheral vascular disease, unspecified: Secondary | ICD-10-CM | POA: Diagnosis not present

## 2020-05-11 DIAGNOSIS — I6523 Occlusion and stenosis of bilateral carotid arteries: Secondary | ICD-10-CM

## 2020-05-11 DIAGNOSIS — E785 Hyperlipidemia, unspecified: Secondary | ICD-10-CM

## 2020-05-11 NOTE — Patient Instructions (Addendum)
Medication Instructions:  No changes  For allergy next fall, Talk with Dondra Prader to take OTC flonase, With Claritin Even add Singulair   Take NTG sl 0.4 MG AS NEEDED FOR HYPERTENSIVE URGENCY   If you need a refill on your cardiac medications before your next appointment, please call your pharmacy.    Lab work: No new labs needed   If you have labs (blood work) drawn today and your tests are completely normal, you will receive your results only by: Marland Kitchen MyChart Message (if you have MyChart) OR . A paper copy in the mail If you have any lab test that is abnormal or we need to change your treatment, we will call you to review the results.   Testing/Procedures: No new testing needed   Follow-Up: At [facility], you and your health needs are our priority.  As part of our continuing mission to provide you with exceptional heart care, we have created designated Provider Care Teams.  These Care Teams include your primary Cardiologist (physician) and Advanced Practice Providers (APPs -  Physician Assistants and Nurse Practitioners) who all work together to provide you with the care you need, when you need it.  . You will need a follow up appointment in 12 months .  Marland Kitchen Providers on your designated Care Team:   . Murray Hodgkins, NP . Christell Faith, PA-C . Marrianne Mood, PA-C  Any Other Special Instructions Will Be Listed Below (If Applicable).  For educational health videos Log in to : [email or web address] Or : [email or web address], password : [email or web address]

## 2020-05-21 ENCOUNTER — Other Ambulatory Visit: Payer: Self-pay | Admitting: Cardiovascular Disease

## 2020-05-21 NOTE — Telephone Encounter (Signed)
*  STAT* If patient is at the pharmacy, call can be transferred to refill team.   1. Which medications need to be refilled? (please list name of each medication and dose if known) nitroglycerin   2. Which pharmacy/location (including street and city if local pharmacy) is medication to be sent to? [facility] [address] (not in [facility])  3. Do they need a 30 day or 90 day supply? 30 day

## 2020-05-21 NOTE — Telephone Encounter (Signed)
Pt requesting Nitro to be sent in pt hasn't had it filled since 2015.  Pt mentioned that he needs a new Rx and that Dr.Gollan mentioned he would send it in after his visit but never sent it to his pharmacy. Pt would like a refill please advise medication not on pt's med list.

## 2020-05-23 NOTE — Telephone Encounter (Signed)
Okay to refill nitro

## 2020-05-24 MED ORDER — NITROGLYCERIN 0.4 MG SL SUBL: 0.4000 mg | SUBLINGUAL_TABLET | SUBLINGUAL | 3 refills | Status: DC | PRN

## 2020-05-24 NOTE — Telephone Encounter (Signed)
Nitro Rx sent to pharmacy.

## 2020-05-24 NOTE — Addendum Note (Signed)
Addended by: Annia Belt on: 05/24/2020 01:32 PM   Modules accepted: Orders

## 2020-06-16 ENCOUNTER — Other Ambulatory Visit: Payer: Self-pay | Admitting: Internal Medicine

## 2020-07-02 ENCOUNTER — Other Ambulatory Visit: Payer: Self-pay | Admitting: Internal Medicine

## 2020-07-09 DIAGNOSIS — M4316 Spondylolisthesis, lumbar region: Secondary | ICD-10-CM | POA: Diagnosis not present

## 2020-07-09 DIAGNOSIS — M79605 Pain in left leg: Secondary | ICD-10-CM | POA: Diagnosis not present

## 2020-07-23 ENCOUNTER — Encounter: Payer: Self-pay | Admitting: Internal Medicine

## 2020-07-23 ENCOUNTER — Telehealth (INDEPENDENT_AMBULATORY_CARE_PROVIDER_SITE_OTHER): Payer: Medicare Other | Admitting: Internal Medicine

## 2020-07-23 VITALS — BP 127/66 | HR 61 | Ht 70.0 in | Wt 198.0 lb

## 2020-07-23 DIAGNOSIS — K581 Irritable bowel syndrome with constipation: Secondary | ICD-10-CM | POA: Diagnosis not present

## 2020-07-23 DIAGNOSIS — R531 Weakness: Secondary | ICD-10-CM | POA: Diagnosis not present

## 2020-07-23 DIAGNOSIS — R7302 Impaired glucose tolerance (oral): Secondary | ICD-10-CM

## 2020-07-23 DIAGNOSIS — I10 Essential (primary) hypertension: Secondary | ICD-10-CM

## 2020-07-23 DIAGNOSIS — I25118 Atherosclerotic heart disease of native coronary artery with other forms of angina pectoris: Secondary | ICD-10-CM | POA: Diagnosis not present

## 2020-07-23 MED ORDER — GABAPENTIN 100 MG PO CAPS: 300.0000 mg | ORAL_CAPSULE | Freq: Every day | ORAL | 3 refills | Status: DC

## 2020-07-23 NOTE — Progress Notes (Signed)
Telephone  Note  This visit type as conducted due to national recommendations for restrictions regarding the COVID-19 pandemic (e.g. social distancing).  This format is felt to be most appropriate for this patient at this time.  All issues noted in this document were discussed and addressed.  No physical exam was performed (except for noted visual exam findings with Video Visits).   I connected with@ on 07/23/20 at  2:30 PM EDT by  telephone and verified that I am speaking with the correct person using two identifiers. Location patient: home Location provider: work or home office Persons participating in the virtual visit: patient, provider  I discussed the limitations, risks, security and privacy concerns of performing an evaluation and management service by telephone and the availability of in person appointments. I also discussed with the patient that there may be a patient responsible charge related to this service. The patient expressed understanding and agreed to proceed.   Reason for visit: elevated blood pressure   HPI:  86 yr old male with history of lumbar back pain  Recently evaluated by Dr Arnoldo Morale and treated with prednisne and gabapentin  Symptoms improved but not resolved. Limited to ankle and feet   HTN;  Home readings 127/66 on current regimen. Wakes up about once every 2 weeks with BP 170/90 , fingers and toes tingling + takes an extra half tablet of metoprolol ROS: See pertinent positives and negatives per HPI.  Past Medical History:  Diagnosis Date  . 3-vessel coronary artery disease 2006   [place]  . basal cell   . Basal cell carcinoma 07/09/2012  . BPH (benign prostatic hyperplasia)   . GERD (gastroesophageal reflux disease)   . Hyperlipidemia   . Hypertension   . Lumbar stenosis 12/03/13   MRI - lumbar pain with radiation down to LE  . Myocardial infarction ([place]) 2006  . OAB (overactive bladder)   . Osteoporosis 2010   by DEXA at [facility]  . Sleep apnea   .  Spondylolisthesis of lumbar region 11/23/13   MRI Lumbar spine    Past Surgical History:  Procedure Laterality Date  . APPENDECTOMY  1951  . COLONOSCOPY    . COLONOSCOPY WITH PROPOFOL N/A 11/15/2015   Procedure: COLONOSCOPY WITH PROPOFOL;  Surgeon: Hulen Luster, MD;  Location: [facility] ENDOSCOPY;  Service: Gastroenterology;  Laterality: N/A;  . CORONARY ARTERY BYPASS GRAFT  2006   3 vessel, s/p AMI  . NASAL SEPTOPLASTY W/ TURBINOPLASTY  1981  . TONSILLECTOMY      Family History  Problem Relation Age of Onset  . Hyperlipidemia Mother   . Hypertension Mother   . Hypertension Father     SOCIAL HX:  reports that he quit smoking about 57 years ago. His smoking use included cigarettes. He has never used smokeless tobacco. He reports that he does not drink alcohol and does not use drugs.   Current Outpatient Medications:  .  acetaminophen (TYLENOL) 500 MG tablet, Take 500 mg by mouth at bedtime. , Disp: , Rfl:  .  amLODipine (NORVASC) 2.5 MG tablet, TAKE 1 TABLET BY MOUTH  DAILY (Patient taking differently: Taking at bedtime), Disp: 90 tablet, Rfl: 3 .  aspirin 81 MG EC tablet, Take 81 mg by mouth daily. , Disp: , Rfl:  .  Blood Pressure Monitoring (BLOOD PRESSURE MONITOR AUTOMAT) DEVI, Use daily to check blood pressure, Disp: 1 Device, Rfl: 0 .  cholecalciferol (VITAMIN D) 25 MCG (1000 UNIT) tablet, , Disp: , Rfl:  .  diclofenac  Sodium (VOLTAREN) 1 % GEL, APPLY 4 GRAMS TOPICALLY 4  (FOUR) TIMES DAILY., Disp: 400 g, Rfl: 0 .  finasteride (PROSCAR) 5 MG tablet, TAKE 1 TABLET BY MOUTH  DAILY, Disp: 90 tablet, Rfl: 3 .  lisinopril (ZESTRIL) 20 MG tablet, TAKE 1 TABLET BY MOUTH  DAILY, Disp: 90 tablet, Rfl: 3 .  metoprolol tartrate (LOPRESSOR) 25 MG tablet, TAKE ONE-HALF TABLET BY  MOUTH TWICE DAILY, Disp: 90 tablet, Rfl: 3 .  nitroGLYCERIN (NITROSTAT) 0.4 MG SL tablet, Place 1 tablet (0.4 mg total) under the tongue every 5 (five) minutes as needed for chest pain. Maximum of 3 doses., Disp: 25  tablet, Rfl: 3 .  omeprazole (PRILOSEC) 20 MG capsule, TAKE 2 CAPSULES BY MOUTH  DAILY, Disp: 180 capsule, Rfl: 3 .  oxybutynin (DITROPAN) 5 MG tablet, TAKE 1 TABLET BY MOUTH  TWICE DAILY, Disp: 180 tablet, Rfl: 3 .  polyethylene glycol powder (GLYCOLAX/MIRALAX) powder, Take 17 g by mouth once. Dissolve 1 capful of power into any liquid and drink once daily (Patient taking differently: Take 17 g by mouth at bedtime. Dissolve 1 capful of power into any liquid and drink once daily), Disp: 500 g, Rfl: 0 .  rOPINIRole (REQUIP) 0.25 MG tablet, TAKE 1 TABLET BY MOUTH AT  BEDTIME., Disp: 90 tablet, Rfl: 3 .  Simethicone Extra Strength 125 MG CAPS, Take 125 mg by mouth as needed (gas)., Disp: , Rfl:  .  simvastatin (ZOCOR) 20 MG tablet, TAKE 1 TABLET BY MOUTH AT  BEDTIME, Disp: 90 tablet, Rfl: 3 .  tamsulosin (FLOMAX) 0.4 MG CAPS capsule, TAKE ONE CAPSULE BY MOUTH  EVERY DAY AFTER BREAKFAST, Disp: 90 capsule, Rfl: 3 .  Azelastine-Fluticasone 137-50 MCG/ACT SUSP, Place 1 spray into both nostrils 2 (two) times daily as needed. (Patient not taking: Reported on 05/11/2020), Disp: 23 g, Rfl: 5 .  gabapentin (NEURONTIN) 100 MG capsule, Take 3 capsules (300 mg total) by mouth daily. In divided doses, Disp: 90 capsule, Rfl: 3  EXAM:  General impression: alert, cooperative and articulate.  No signs of being in distress  Lungs: speech is fluent sentence length suggests that patient is not short of breath and not punctuated by cough, sneezing or sniffing. Marland Kitchen   Psych: affect normal.  speech is articulate and non pressured .  Denies suicidal thoughts   ASSESSMENT AND PLAN:  Discussed the following assessment and plan:  Weakness generalized - Plan: Comprehensive metabolic panel, TSH, CBC with Differential/Platelet  Irritable bowel syndrome with constipation  Impaired glucose tolerance  Essential hypertension  Coronary artery disease of native artery of native heart with stable angina pectoris (HCC)  IBS  (irritable bowel syndrome) Symptoms managed with miralax   Impaired glucose tolerance Last a1c was normal range. BMI, Low Gi diet reviewed with patient and need for regular exercise.  Repeat A1c due   Lab Results  Component Value Date   HGBA1C 5.7 11/17/2019     Hypertension Well controlled on current regimen.  He has occasional nocturnal elevations  Managed with additional half tablet of metoprolol  Renal function due, no changes today.  CAD (coronary artery disease), native coronary artery Medical management with statin,  Beta blocker,  Asa and lisinopril    I discussed the assessment and treatment plan with the patient. The patient was provided an opportunity to ask questions and all were answered. The patient agreed with the plan and demonstrated an understanding of the instructions.   The patient was advised to call back or seek an  in-person evaluation if the symptoms worsen or if the condition fails to improve as anticipated.  I provided 22 minutes of non-face-to-face time during this encounter.   [name], MD

## 2020-07-25 ENCOUNTER — Encounter: Payer: Self-pay | Admitting: Internal Medicine

## 2020-07-25 NOTE — Assessment & Plan Note (Signed)
Well controlled on current regimen.  He has occasional nocturnal elevations  Managed with additional half tablet of metoprolol  Renal function due, no changes today.

## 2020-07-25 NOTE — Assessment & Plan Note (Signed)
Medical management with statin,  Beta blocker,  Asa and lisinopril

## 2020-07-25 NOTE — Assessment & Plan Note (Signed)
Symptoms managed with miralax

## 2020-07-25 NOTE — Assessment & Plan Note (Signed)
Last a1c was normal range. BMI, Low Gi diet reviewed with patient and need for regular exercise.  Repeat A1c due   Lab Results  Component Value Date   HGBA1C 5.7 11/17/2019

## 2020-07-27 ENCOUNTER — Other Ambulatory Visit: Payer: Self-pay | Admitting: Student

## 2020-07-27 DIAGNOSIS — M4316 Spondylolisthesis, lumbar region: Secondary | ICD-10-CM

## 2020-07-29 ENCOUNTER — Ambulatory Visit: Payer: Medicare Other | Admitting: Allergy and Immunology

## 2020-08-14 ENCOUNTER — Ambulatory Visit
Admission: RE | Admit: 2020-08-14 | Discharge: 2020-08-14 | Disposition: A | Discharge status: Home / Self Care | Payer: Medicare Other | Source: Ambulatory Visit | Attending: Student | Admitting: Student

## 2020-08-14 ENCOUNTER — Other Ambulatory Visit: Payer: Self-pay

## 2020-08-14 DIAGNOSIS — M4316 Spondylolisthesis, lumbar region: Secondary | ICD-10-CM | POA: Insufficient documentation

## 2020-08-14 DIAGNOSIS — M545 Low back pain: Secondary | ICD-10-CM | POA: Diagnosis not present

## 2020-08-17 ENCOUNTER — Other Ambulatory Visit: Payer: Self-pay | Admitting: Internal Medicine

## 2020-08-23 ENCOUNTER — Telehealth: Payer: Self-pay | Admitting: Internal Medicine

## 2020-08-23 DIAGNOSIS — E538 Deficiency of other specified B group vitamins: Secondary | ICD-10-CM

## 2020-08-23 NOTE — Telephone Encounter (Signed)
Pt would like to know if Dr. Derrel Nip can prescribe him B12 like she does his wife? He would like a call back.

## 2020-08-24 NOTE — Telephone Encounter (Signed)
Yes we can, but insurance will not pay unless for it unless he is B12 deficient, so we should check his level before he gets his first injection.  Labs ordered

## 2020-08-24 NOTE — Telephone Encounter (Signed)
Called and spoke to [place]. He states that he watches his wife receive the shots and was told by her that It helps with her energy. He would like to receive the shot to help with his energy levels. He states that his daughter gives his wife the shot and that she could give him the injection as well.

## 2020-08-24 NOTE — Telephone Encounter (Signed)
See unrouted reply. Needs lab appt

## 2020-08-25 NOTE — Telephone Encounter (Signed)
Left message to call back  

## 2020-08-26 NOTE — Telephone Encounter (Signed)
Called the patient and scheduled for labs on 08/27/20 at 2:45 pm.

## 2020-08-27 ENCOUNTER — Other Ambulatory Visit: Payer: Self-pay

## 2020-08-27 ENCOUNTER — Other Ambulatory Visit (INDEPENDENT_AMBULATORY_CARE_PROVIDER_SITE_OTHER): Payer: Medicare Other

## 2020-08-27 DIAGNOSIS — R531 Weakness: Secondary | ICD-10-CM

## 2020-08-27 DIAGNOSIS — E538 Deficiency of other specified B group vitamins: Secondary | ICD-10-CM | POA: Diagnosis not present

## 2020-08-30 LAB — CBC WITH DIFFERENTIAL/PLATELET
Absolute Monocytes: 662 cells/uL (ref 200–950)
Basophils Absolute: 29 cells/uL (ref 0–200)
Basophils Relative: 0.4 %
Eosinophils Absolute: 158 cells/uL (ref 15–500)
Eosinophils Relative: 2.2 %
HCT: 41.8 % (ref 38.5–50.0)
Hemoglobin: 14.1 g/dL (ref 13.2–17.1)
Lymphs Abs: 1958 cells/uL (ref 850–3900)
MCH: 32.9 pg (ref 27.0–33.0)
MCHC: 33.7 g/dL (ref 32.0–36.0)
MCV: 97.7 fL (ref 80.0–100.0)
MPV: 10.3 fL (ref 7.5–12.5)
Monocytes Relative: 9.2 %
Neutro Abs: 4392 cells/uL (ref 1500–7800)
Neutrophils Relative %: 61 %
Platelets: 208 10*3/uL (ref 140–400)
RBC: 4.28 10*6/uL (ref 4.20–5.80)
RDW: 12.4 % (ref 11.0–15.0)
Total Lymphocyte: 27.2 %
WBC: 7.2 10*3/uL (ref 3.8–10.8)

## 2020-08-30 LAB — COMPREHENSIVE METABOLIC PANEL
AG Ratio: 1.7 (calc) (ref 1.0–2.5)
ALT: 15 U/L (ref 9–46)
AST: 18 U/L (ref 10–35)
Albumin: 3.8 g/dL (ref 3.6–5.1)
Alkaline phosphatase (APISO): 52 U/L (ref 35–144)
BUN: 16 mg/dL (ref 7–25)
CO2: 28 mmol/L (ref 20–32)
Calcium: 9 mg/dL (ref 8.6–10.3)
Chloride: 106 mmol/L (ref 98–110)
Creat: 0.93 mg/dL (ref 0.70–1.11)
Globulin: 2.2 g/dL (calc) (ref 1.9–3.7)
Glucose, Bld: 83 mg/dL (ref 65–99)
Potassium: 4.2 mmol/L (ref 3.5–5.3)
Sodium: 141 mmol/L (ref 135–146)
Total Bilirubin: 0.5 mg/dL (ref 0.2–1.2)
Total Protein: 6 g/dL — ABNORMAL LOW (ref 6.1–8.1)

## 2020-08-30 LAB — TSH: TSH: 1.45 mIU/L (ref 0.40–4.50)

## 2020-08-30 LAB — B12 AND FOLATE PANEL
Folate: 8.6 ng/mL
Vitamin B-12: 411 pg/mL (ref 200–1100)

## 2020-08-30 LAB — METHYLMALONIC ACID, SERUM: Methylmalonic Acid, Quant: 450 nmol/L — ABNORMAL HIGH (ref 87–318)

## 2020-08-30 NOTE — Progress Notes (Signed)
Your b12 level is normal.  So if you would like to obtain b12 injections,  I will order them,  but  insurance will not pay for them  The rest of your labs are normal as well, too.

## 2020-08-31 NOTE — Progress Notes (Signed)
Good news! I ran an additional test that just resulted that does suggest you have an inherent B12 deficiency, so I can code your B12 injections as necessary and get insurance to pay for them.  Let me know where you would like ot have your injections done: either here in the office or at your pharmacy,

## 2020-09-02 ENCOUNTER — Other Ambulatory Visit: Payer: Self-pay | Admitting: Internal Medicine

## 2020-09-02 MED ORDER — CYANOCOBALAMIN 1000 MCG/ML IJ SOLN: 1000.0000 ug | INTRAMUSCULAR | 3 refills | Status: DC

## 2020-09-02 MED ORDER — SYRINGE 25G X 1" 3 ML MISC: 0 refills | Status: DC

## 2020-09-02 NOTE — Progress Notes (Signed)
B12 liquid and syringes sent to pharmacy .  He should give himself a Weekly injection x 4 weeks,  then monthly thereafter

## 2020-09-07 DIAGNOSIS — M4316 Spondylolisthesis, lumbar region: Secondary | ICD-10-CM | POA: Diagnosis not present

## 2020-09-07 DIAGNOSIS — M48062 Spinal stenosis, lumbar region with neurogenic claudication: Secondary | ICD-10-CM | POA: Diagnosis not present

## 2020-10-19 DIAGNOSIS — G894 Chronic pain syndrome: Secondary | ICD-10-CM | POA: Diagnosis not present

## 2020-10-19 DIAGNOSIS — M47816 Spondylosis without myelopathy or radiculopathy, lumbar region: Secondary | ICD-10-CM | POA: Diagnosis not present

## 2020-10-19 DIAGNOSIS — M48061 Spinal stenosis, lumbar region without neurogenic claudication: Secondary | ICD-10-CM | POA: Diagnosis not present

## 2020-10-19 DIAGNOSIS — Z79891 Long term (current) use of opiate analgesic: Secondary | ICD-10-CM | POA: Diagnosis not present

## 2020-10-19 DIAGNOSIS — M4316 Spondylolisthesis, lumbar region: Secondary | ICD-10-CM | POA: Diagnosis not present

## 2020-11-12 ENCOUNTER — Other Ambulatory Visit: Payer: Self-pay | Admitting: Internal Medicine

## 2020-11-15 DIAGNOSIS — M48061 Spinal stenosis, lumbar region without neurogenic claudication: Secondary | ICD-10-CM | POA: Diagnosis not present

## 2020-11-15 DIAGNOSIS — G894 Chronic pain syndrome: Secondary | ICD-10-CM | POA: Diagnosis not present

## 2020-11-15 DIAGNOSIS — M4316 Spondylolisthesis, lumbar region: Secondary | ICD-10-CM | POA: Diagnosis not present

## 2020-11-15 DIAGNOSIS — M47816 Spondylosis without myelopathy or radiculopathy, lumbar region: Secondary | ICD-10-CM | POA: Diagnosis not present

## 2020-11-16 ENCOUNTER — Ambulatory Visit (INDEPENDENT_AMBULATORY_CARE_PROVIDER_SITE_OTHER): Payer: Medicare Other | Admitting: Vascular Surgery

## 2020-11-16 ENCOUNTER — Ambulatory Visit (INDEPENDENT_AMBULATORY_CARE_PROVIDER_SITE_OTHER): Payer: Medicare Other

## 2020-11-16 ENCOUNTER — Other Ambulatory Visit: Payer: Self-pay

## 2020-11-16 ENCOUNTER — Encounter (INDEPENDENT_AMBULATORY_CARE_PROVIDER_SITE_OTHER): Payer: Self-pay | Admitting: Vascular Surgery

## 2020-11-16 VITALS — BP 164/77 | HR 64 | Resp 16 | Wt 200.0 lb

## 2020-11-16 DIAGNOSIS — I739 Peripheral vascular disease, unspecified: Secondary | ICD-10-CM

## 2020-11-16 DIAGNOSIS — E785 Hyperlipidemia, unspecified: Secondary | ICD-10-CM | POA: Diagnosis not present

## 2020-11-16 DIAGNOSIS — I10 Essential (primary) hypertension: Secondary | ICD-10-CM

## 2020-11-16 DIAGNOSIS — I6523 Occlusion and stenosis of bilateral carotid arteries: Secondary | ICD-10-CM

## 2020-11-16 NOTE — Assessment & Plan Note (Signed)
ABIs are noncompressible today from medial calcification, but the waveforms and digital pressures remain good.  No role for intervention with preserved perfusion with his PAD at this time.  Recheck in 1 year.

## 2020-11-16 NOTE — Assessment & Plan Note (Signed)
Duplex today shows stable 1 to 39% right ICA stenosis and stable 40 to 59% left ICA stenosis.  No role for intervention in an asymptomatic 86 year old at these levels.  Continue to monitor on an annual basis.  No change in medical regimen.

## 2020-11-16 NOTE — Progress Notes (Signed)
MRN : [id number]  David Atkins is a 86 y.o. (01/13/1929) male who presents with chief complaint of  Chief Complaint  Patient presents with  . Follow-up    Ultrasound follow up  .  History of Present Illness: Patient returns in follow-up of multiple vascular issues.  He is doing well today.  He does not have specific complaints.  He denies any new ulceration, infection, or rest pain of the lower extremities.  No disabling claudication symptoms. ABIs are noncompressible today from medial calcification, but the waveforms and digital pressures remain good. He is also study today for carotid disease.  No focal neurologic symptoms. Specifically, the patient denies amaurosis fugax, speech or swallowing difficulties, or arm or leg weakness or numbness.  Duplex today shows stable 1 to 39% right ICA stenosis and stable 40 to 59% left ICA stenosis.  Current Outpatient Medications  Medication Sig Dispense Refill  . acetaminophen (TYLENOL) 500 MG tablet Take 500 mg by mouth at bedtime.     Marland Kitchen amLODipine (NORVASC) 2.5 MG tablet TAKE 1 TABLET BY MOUTH  DAILY 90 tablet 3  . aspirin 81 MG EC tablet Take 81 mg by mouth daily.     . Blood Pressure Monitoring (BLOOD PRESSURE MONITOR AUTOMAT) DEVI Use daily to check blood pressure 1 Device 0  . cholecalciferol (VITAMIN D) 25 MCG (1000 UNIT) tablet     . cyanocobalamin (,VITAMIN B-12,) 1000 MCG/ML injection Inject 1 mL (1,000 mcg total) into the muscle once a week. For 4 weeks,  Then monthly thereafter 4 mL 3  . finasteride (PROSCAR) 5 MG tablet TAKE 1 TABLET BY MOUTH  DAILY 90 tablet 3  . gabapentin (NEURONTIN) 100 MG capsule Take 3 capsules (300 mg total) by mouth daily. In divided doses 90 capsule 3  . lisinopril (ZESTRIL) 20 MG tablet TAKE 1 TABLET BY MOUTH  DAILY 90 tablet 3  . metoprolol tartrate (LOPRESSOR) 25 MG tablet TAKE ONE-HALF TABLET BY  MOUTH TWICE DAILY 90 tablet 3  . nitroGLYCERIN (NITROSTAT) 0.4 MG SL tablet Place 1 tablet (0.4 mg total)  under the tongue every 5 (five) minutes as needed for chest pain. Maximum of 3 doses. 25 tablet 3  . omeprazole (PRILOSEC) 20 MG capsule TAKE 2 CAPSULES BY MOUTH  DAILY 180 capsule 3  . oxybutynin (DITROPAN) 5 MG tablet TAKE 1 TABLET BY MOUTH  TWICE DAILY 180 tablet 3  . polyethylene glycol powder (GLYCOLAX/MIRALAX) powder Take 17 g by mouth once. Dissolve 1 capful of power into any liquid and drink once daily (Patient taking differently: Take 17 g by mouth at bedtime. Dissolve 1 capful of power into any liquid and drink once daily) 500 g 0  . rOPINIRole (REQUIP) 0.25 MG tablet TAKE 1 TABLET BY MOUTH AT  BEDTIME. 90 tablet 3  . Simethicone Extra Strength 125 MG CAPS Take 125 mg by mouth as needed (gas).    . simvastatin (ZOCOR) 20 MG tablet TAKE 1 TABLET BY MOUTH AT  BEDTIME 90 tablet 3  . Syringe/Needle, Disp, (SYRINGE 3CC/25GX1") 25G X 1" 3 ML MISC Use for b12 injections 50 each 0  . tamsulosin (FLOMAX) 0.4 MG CAPS capsule TAKE ONE CAPSULE BY MOUTH  EVERY DAY AFTER BREAKFAST 90 capsule 3  . Azelastine-Fluticasone 137-50 MCG/ACT SUSP Place 1 spray into both nostrils 2 (two) times daily as needed. (Patient not taking: No sig reported) 23 g 5  . diclofenac Sodium (VOLTAREN) 1 % GEL APPLY 4 GRAMS TOPICALLY 4   TIMES DAILY. (  Patient not taking: Reported on 11/16/2020) 400 g 0   No current facility-administered medications for this visit.    Past Medical History:  Diagnosis Date  . 3-vessel coronary artery disease 2006   [place]  . basal cell   . Basal cell carcinoma 07/09/2012  . BPH (benign prostatic hyperplasia)   . GERD (gastroesophageal reflux disease)   . Hyperlipidemia   . Hypertension   . Lumbar stenosis 12/03/13   MRI - lumbar pain with radiation down to LE  . Myocardial infarction ([place]) 2006  . OAB (overactive bladder)   . Osteoporosis 2010   by [facility]  . Sleep apnea   . Spondylolisthesis of lumbar region 11/23/13   MRI Lumbar spine    Past Surgical History:  Procedure  Laterality Date  . APPENDECTOMY  1951  . COLONOSCOPY    . COLONOSCOPY WITH PROPOFOL N/A 11/15/2015   Procedure: COLONOSCOPY WITH PROPOFOL;  Surgeon: Hulen Luster, MD;  Location: [facility] ENDOSCOPY;  Service: Gastroenterology;  Laterality: N/A;  . CORONARY ARTERY BYPASS GRAFT  2006   3 vessel, s/p AMI  . NASAL SEPTOPLASTY W/ TURBINOPLASTY  1981  . TONSILLECTOMY       Social History   Tobacco Use  . Smoking status: Former Smoker    Types: Cigarettes    Quit date: 07/10/1963    Years since quitting: 57.3  . Smokeless tobacco: Never Used  Substance Use Topics  . Alcohol use: No  . Drug use: No      Family History  Problem Relation Age of Onset  . Hyperlipidemia Mother   . Hypertension Mother   . Hypertension Father   no clotting or bleeding  Allergies  Allergen Reactions  . Sulfa Antibiotics Rash     REVIEW OF SYSTEMS(Negative unless checked)  Constitutional: [] ??Weight loss [] ??Fever [] ??Chills Cardiac: [] ??Chest pain [] ??Chest pressure [] ??Palpitations [] ??Shortness of breath when laying flat [] ??Shortness of breath at rest [] ??Shortness of breath with exertion. Vascular: [x] ??Pain in legs with walking [] ??Pain in legs at rest [] ??Pain in legs when laying flat [] ??Claudication [] ??Pain in feet when walking [] ??Pain in feet at rest [] ??Pain in feet when laying flat [] ??History of DVT [] ??Phlebitis [] ??Swelling in legs [] ??Varicose veins [] ??Non-healing ulcers Pulmonary: [] ??Uses home oxygen [] ??Productive cough [] ??Hemoptysis [] ??Wheeze [] ??COPD [] ??Asthma Neurologic: [x] ??Dizziness [] ??Blackouts [] ??Seizures [] ??History of stroke [] ??History of TIA [] ??Aphasia [] ??Temporary blindness [] ??Dysphagia [] ??Weakness or numbness in arms [] ??Weakness or numbness in legs Musculoskeletal: [x] ??Arthritis [] ??Joint swelling [] ??Joint pain [] ??Low back pain Hematologic: [] ??Easy bruising [] ??Easy bleeding  [] ??Hypercoagulable state [] ??Anemic  Gastrointestinal: [] ??Blood in stool [] ??Vomiting blood [] ??Gastroesophageal reflux/heartburn [] ??Abdominal pain Genitourinary: [] ??Chronic kidney disease [] ??Difficult urination [] ??Frequent urination [] ??Burning with urination [] ??Hematuria Skin: [] ??Rashes [] ??Ulcers [] ??Wounds Psychological: [] ??History of anxiety [] ??History of major depression.   Physical Examination  Vitals:   11/16/20 1515  BP: (!) 164/77  Pulse: 64  Resp: 16  Weight: 200 lb (90.7 kg)   Body mass index is 28.7 kg/m. Gen:  WD/WN, NAD. Appears younger than stated age. Head: [place]/AT, No temporalis wasting. Ear/Nose/Throat: Hearing grossly intact, nares w/o erythema or drainage, trachea midline Eyes: Conjunctiva clear. Sclera non-icteric Neck: Supple.  Soft left carotid bruit  Pulmonary:  Good air movement, equal and clear to auscultation bilaterally.  Cardiac: RRR, No JVD Vascular:  Vessel Right Left  DP Palpable Palpable  PT Palpable Palpable   Musculoskeletal: M/S 5/5 throughout.  No deformity or atrophy. Mild LE edema. Neurologic: CN 2-12 intact. Sensation grossly intact in extremities.  Symmetrical.  Speech is fluent. Motor exam  as listed above. Psychiatric: Judgment intact, Mood & affect appropriate for pt's clinical situation. Dermatologic: No rashes or ulcers noted.  No cellulitis or open wounds.      CBC Lab Results  Component Value Date   WBC 7.2 08/27/2020   HGB 14.1 08/27/2020   HCT 41.8 08/27/2020   MCV 97.7 08/27/2020   PLT 208 08/27/2020    BMET    Component Value Date/Time   NA 141 08/27/2020 1457   NA 143 01/06/2014 1655   K 4.2 08/27/2020 1457   K 3.6 01/06/2014 1655   CL 106 08/27/2020 1457   CL 109 (H) 01/06/2014 1655   CO2 28 08/27/2020 1457   CO2 27 01/06/2014 1655   GLUCOSE 83 08/27/2020 1457   GLUCOSE 88 01/06/2014 1655   BUN 16 08/27/2020 1457   BUN 16 01/06/2014 1655   CREATININE 0.93  08/27/2020 1457   CALCIUM 9.0 08/27/2020 1457   CALCIUM 9.0 01/06/2014 1655   GFRNONAA >60 12/15/2018 1813   GFRNONAA >60 01/06/2014 1655   GFRAA >60 12/15/2018 1813   GFRAA >60 01/06/2014 1655   CrCl cannot be calculated (Patient's most recent lab result is older than the maximum 21 days allowed.).  COAG No results found for: INR, PROTIME  Radiology No results found.   Assessment/Plan Hypertension blood pressure control important in reducing the progression of atherosclerotic disease. On appropriate oral medications.   Hyperlipidemia LDL goal <70 lipid control important in reducing the progression of atherosclerotic disease. Continue statin therapy  PAD (peripheral artery disease) (HCC) ABIs are noncompressible today from medial calcification, but the waveforms and digital pressures remain good.  No role for intervention with preserved perfusion with his PAD at this time.  Recheck in 1 year.  Bilateral carotid artery stenosis Duplex today shows stable 1 to 39% right ICA stenosis and stable 40 to 59% left ICA stenosis.  No role for intervention in an asymptomatic 86 year old at these levels.  Continue to monitor on an annual basis.  No change in medical regimen.    Leotis Pain, MD  11/16/2020 3:58 PM    This note was created with Dragon medical transcription system.  Any errors from dictation are purely unintentional

## 2020-11-17 ENCOUNTER — Ambulatory Visit (INDEPENDENT_AMBULATORY_CARE_PROVIDER_SITE_OTHER): Payer: Medicare Other

## 2020-11-17 VITALS — Ht 70.0 in | Wt 200.0 lb

## 2020-11-17 DIAGNOSIS — Z0000 Encounter for general adult medical examination without abnormal findings: Secondary | ICD-10-CM | POA: Diagnosis not present

## 2020-11-17 NOTE — Patient Instructions (Addendum)
[name] , Thank you for taking time to come for your Medicare Wellness Visit. I appreciate your ongoing commitment to your health goals. Please review the following plan we discussed and let me know if I can assist you in the future.   These are the goals we discussed: Goals    . Follow up with Primary Care Provider     As needed Increase physical activity: chair exercises, walking       This is a list of the screening recommended for you and due dates:  Health Maintenance  Topic Date Due  . Flu Shot  03/03/2021*  . Tetanus Vaccine  08/16/2027  . COVID-19 Vaccine  Completed  . Pneumonia vaccines  Completed  *Topic was postponed. The date shown is not the original due date.     Immunizations Immunization History  Administered Date(s) Administered  . Fluad Quad(high Dose 65+) 08/04/2019  . Influenza Split 11/11/2012, 09/25/2014  . Influenza, High Dose Seasonal PF 10/15/2013, 10/01/2015, 08/13/2018  . Influenza-Unspecified 10/01/2016, 08/31/2017  . PFIZER SARS-COV-2 Vaccination 12/15/2019, 01/05/2020  . Pneumococcal Conjugate-13 11/25/2014, 04/03/2015  . Pneumococcal Polysaccharide-23 11/25/2012  . Tdap 08/15/2017  . Zoster 04/10/2011  . Zoster Recombinat (Shingrix) 09/11/2019    Keep all routine maintenance appointments.   Advanced directives: Completed, on file.   Conditions/risks identified: None new.   Follow up in one year for your annual wellness visit.   Preventive Care 14 Years and Older, Male Preventive care refers to lifestyle choices and visits with your health care provider that can promote health and wellness. What does preventive care include?  A yearly physical exam. This is also called an annual well check.  Dental exams once or twice a year.  Routine eye exams. Ask your health care provider how often you should have your eyes checked.  Personal lifestyle choices, including:  Daily care of your teeth and gums.  Regular physical  activity.  Eating a healthy diet.  Avoiding tobacco and drug use.  Limiting alcohol use.  Practicing safe sex.  Taking low doses of aspirin every day.  Taking vitamin and mineral supplements as recommended by your health care provider. What happens during an annual well check? The services and screenings done by your health care provider during your annual well check will depend on your age, overall health, lifestyle risk factors, and family history of disease. Counseling  Your health care provider may ask you questions about your:  Alcohol use.  Tobacco use.  Drug use.  Emotional well-being.  Home and relationship well-being.  Sexual activity.  Eating habits.  History of falls.  Memory and ability to understand (cognition).  Work and work Statistician. Screening  You may have the following tests or measurements:  Height, weight, and BMI.  Blood pressure.  Lipid and cholesterol levels. These may be checked every 5 years, or more frequently if you are over 26 years old.  Skin check.  Lung cancer screening. You may have this screening every year starting at age 8 if you have a 30-pack-year history of smoking and currently smoke or have quit within the past 15 years.  Fecal occult blood test (FOBT) of the stool. You may have this test every year starting at age 51.  Flexible sigmoidoscopy or colonoscopy. You may have a sigmoidoscopy every 5 years or a colonoscopy every 10 years starting at age 17.  Prostate cancer screening. Recommendations will vary depending on your family history and other risks.  Hepatitis C blood test.  Hepatitis  B blood test.  Sexually transmitted disease (STD) testing.  Diabetes screening. This is done by checking your blood sugar (glucose) after you have not eaten for a while (fasting). You may have this done every 1-3 years.  Abdominal aortic aneurysm (AAA) screening. You may need this if you are a current or former  smoker.  Osteoporosis. You may be screened starting at age 5 if you are at high risk. Talk with your health care provider about your test results, treatment options, and if necessary, the need for more tests. Vaccines  Your health care provider may recommend certain vaccines, such as:  Influenza vaccine. This is recommended every year.  Tetanus, diphtheria, and acellular pertussis (Tdap, Td) vaccine. You may need a Td booster every 10 years.  Zoster vaccine. You may need this after age 47.  Pneumococcal 13-valent conjugate (PCV13) vaccine. One dose is recommended after age 57.  Pneumococcal polysaccharide (PPSV23) vaccine. One dose is recommended after age 50. Talk to your health care provider about which screenings and vaccines you need and how often you need them. This information is not intended to replace advice given to you by your health care provider. Make sure you discuss any questions you have with your health care provider. Document Released: 12/17/2015 Document Revised: 08/09/2016 Document Reviewed: 09/21/2015 Elsevier Interactive Patient Education  2017 [employer] Prevention in the Home Falls can cause injuries. They can happen to people of all ages. There are many things you can do to make your home safe and to help prevent falls. What can I do on the outside of my home?  Regularly fix the edges of walkways and driveways and fix any cracks.  Remove anything that might make you trip as you walk through a door, such as a raised step or threshold.  Trim any bushes or trees on the path to your home.  Use bright outdoor lighting.  Clear any walking paths of anything that might make someone trip, such as rocks or tools.  Regularly check to see if handrails are loose or broken. Make sure that both sides of any steps have handrails.  Any raised decks and porches should have guardrails on the edges.  Have any leaves, snow, or ice cleared regularly.  Use sand or  salt on walking paths during winter.  Clean up any spills in your garage right away. This includes oil or grease spills. What can I do in the bathroom?  Use night lights.  Install grab bars by the toilet and in the tub and shower. Do not use towel bars as grab bars.  Use non-skid mats or decals in the tub or shower.  If you need to sit down in the shower, use a plastic, non-slip stool.  Keep the floor dry. Clean up any water that spills on the floor as soon as it happens.  Remove soap buildup in the tub or shower regularly.  Attach bath mats securely with double-sided non-slip rug tape.  Do not have throw rugs and other things on the floor that can make you trip. What can I do in the bedroom?  Use night lights.  Make sure that you have a light by your bed that is easy to reach.  Do not use any sheets or blankets that are too big for your bed. They should not hang down onto the floor.  Have a firm chair that has side arms. You can use this for support while you get dressed.  Do not have  throw rugs and other things on the floor that can make you trip. What can I do in the kitchen?  Clean up any spills right away.  Avoid walking on wet floors.  Keep items that you use a lot in easy-to-reach places.  If you need to reach something above you, use a strong step stool that has a grab bar.  Keep electrical cords out of the way.  Do not use floor polish or wax that makes floors slippery. If you must use wax, use non-skid floor wax.  Do not have throw rugs and other things on the floor that can make you trip. What can I do with my stairs?  Do not leave any items on the stairs.  Make sure that there are handrails on both sides of the stairs and use them. Fix handrails that are broken or loose. Make sure that handrails are as long as the stairways.  Check any carpeting to make sure that it is firmly attached to the stairs. Fix any carpet that is loose or worn.  Avoid having  throw rugs at the top or bottom of the stairs. If you do have throw rugs, attach them to the floor with carpet tape.  Make sure that you have a light switch at the top of the stairs and the bottom of the stairs. If you do not have them, ask someone to add them for you. What else can I do to help prevent falls?  Wear shoes that:  Do not have high heels.  Have rubber bottoms.  Are comfortable and fit you well.  Are closed at the toe. Do not wear sandals.  If you use a stepladder:  Make sure that it is fully opened. Do not climb a closed stepladder.  Make sure that both sides of the stepladder are locked into place.  Ask someone to hold it for you, if possible.  Clearly mark and make sure that you can see:  Any grab bars or handrails.  First and last steps.  Where the edge of each step is.  Use tools that help you move around (mobility aids) if they are needed. These include:  Canes.  Walkers.  Scooters.  Crutches.  Turn on the lights when you go into a dark area. Replace any light bulbs as soon as they burn out.  Set up your furniture so you have a clear path. Avoid moving your furniture around.  If any of your floors are uneven, fix them.  If there are any pets around you, be aware of where they are.  Review your medicines with your doctor. Some medicines can make you feel dizzy. This can increase your chance of falling. Ask your doctor what other things that you can do to help prevent falls. This information is not intended to replace advice given to you by your health care provider. Make sure you discuss any questions you have with your health care provider. Document Released: 09/16/2009 Document Revised: 04/27/2016 Document Reviewed: 12/25/2014 Elsevier Interactive Patient Education  2017 [name].

## 2020-11-17 NOTE — Progress Notes (Addendum)
Subjective:   David Atkins is a 86 y.o. male who presents for Medicare Annual/Subsequent preventive examination.  Review of Systems    No ROS.  Medicare Wellness Virtual Visit.   Cardiac Risk Factors include: advanced age (>65men, >26 women);male gender     Objective:    Today's Vitals   11/17/20 1300  Weight: 200 lb (90.7 kg)  Height: 5\' 10"  (1.778 m)   Body mass index is 28.7 kg/m.  Advanced Directives 11/17/2020 11/14/2019 12/16/2018 12/15/2018 11/11/2018 11/06/2017 08/31/2017  Does Patient Have a Medical Advance Directive? Yes Yes Yes Yes Yes Yes Yes  Type of Paramedic of [place];Living will Living will Living will [place];Living will Living will;Healthcare Power of [place];Living will  Does patient want to make changes to medical advance directive? No - Patient declined No - Patient declined No - Patient declined - No - Patient declined No - Patient declined -  Copy of [place] in Chart? Yes - validated most recent copy scanned in chart (See row information) Yes - validated most recent copy scanned in chart (See row information) - - Yes - validated most recent copy scanned in chart (See row information) No - copy requested -    Current Medications (verified) Outpatient Encounter Medications as of 11/17/2020  Medication Sig   acetaminophen (TYLENOL) 500 MG tablet Take 500 mg by mouth at bedtime.    amLODipine (NORVASC) 2.5 MG tablet TAKE 1 TABLET BY MOUTH  DAILY   aspirin 81 MG EC tablet Take 81 mg by mouth daily.    Azelastine-Fluticasone 137-50 MCG/ACT SUSP Place 1 spray into both nostrils 2 (two) times daily as needed. (Patient not taking: No sig reported)   Blood Pressure Monitoring (BLOOD PRESSURE MONITOR AUTOMAT) DEVI Use daily to check blood pressure   cholecalciferol (VITAMIN D) 25 MCG (1000 UNIT) tablet    cyanocobalamin (,VITAMIN B-12,) 1000  MCG/ML injection Inject 1 mL (1,000 mcg total) into the muscle once a week. For 4 weeks,  Then monthly thereafter   diclofenac Sodium (VOLTAREN) 1 % GEL APPLY 4 GRAMS TOPICALLY 4   TIMES DAILY. (Patient not taking: Reported on 11/16/2020)   finasteride (PROSCAR) 5 MG tablet TAKE 1 TABLET BY MOUTH  DAILY   gabapentin (NEURONTIN) 100 MG capsule Take 3 capsules (300 mg total) by mouth daily. In divided doses (Patient not taking: Reported on 11/17/2020)   lisinopril (ZESTRIL) 20 MG tablet TAKE 1 TABLET BY MOUTH  DAILY   metoprolol tartrate (LOPRESSOR) 25 MG tablet TAKE ONE-HALF TABLET BY  MOUTH TWICE DAILY   nitroGLYCERIN (NITROSTAT) 0.4 MG SL tablet Place 1 tablet (0.4 mg total) under the tongue every 5 (five) minutes as needed for chest pain. Maximum of 3 doses.   omeprazole (PRILOSEC) 20 MG capsule TAKE 2 CAPSULES BY MOUTH  DAILY   oxybutynin (DITROPAN) 5 MG tablet TAKE 1 TABLET BY MOUTH  TWICE DAILY   polyethylene glycol powder (GLYCOLAX/MIRALAX) powder Take 17 g by mouth once. Dissolve 1 capful of power into any liquid and drink once daily (Patient taking differently: Take 17 g by mouth at bedtime. Dissolve 1 capful of power into any liquid and drink once daily)   rOPINIRole (REQUIP) 0.25 MG tablet TAKE 1 TABLET BY MOUTH AT  BEDTIME.   Simethicone Extra Strength 125 MG CAPS Take 125 mg by mouth as needed (gas).   simvastatin (ZOCOR) 20 MG tablet TAKE 1 TABLET BY MOUTH AT  BEDTIME   Syringe/Needle, Disp, (SYRINGE 3CC/25GX1") 25G X 1" 3 ML MISC Use for b12 injections   tamsulosin (FLOMAX) 0.4 MG CAPS capsule TAKE ONE CAPSULE BY MOUTH  EVERY DAY AFTER BREAKFAST   No facility-administered encounter medications on file as of 11/17/2020.    Allergies (verified) Sulfa antibiotics   History: Past Medical History:  Diagnosis Date   3-vessel coronary artery disease 2006   [place]   basal cell    Basal cell carcinoma 07/09/2012   BPH (benign prostatic hyperplasia)    GERD (gastroesophageal reflux  disease)    Hyperlipidemia    Hypertension    Lumbar stenosis 12/03/13   MRI - lumbar pain with radiation down to LE   Myocardial infarction [facility]) 2006   OAB (overactive bladder)    Osteoporosis 2010   by DEXA at [facility]   Sleep apnea    Spondylolisthesis of lumbar region 11/23/13   MRI Lumbar spine   Past Surgical History:  Procedure Laterality Date   APPENDECTOMY  1951   COLONOSCOPY     COLONOSCOPY WITH PROPOFOL N/A 11/15/2015   Procedure: COLONOSCOPY WITH PROPOFOL;  Surgeon: Hulen Luster, MD;  Location: [facility] ENDOSCOPY;  Service: Gastroenterology;  Laterality: N/A;   CORONARY ARTERY BYPASS GRAFT  2006   3 vessel, s/p AMI   NASAL SEPTOPLASTY W/ TURBINOPLASTY  1981   TONSILLECTOMY     Family History  Problem Relation Age of Onset   Hyperlipidemia Mother    Hypertension Mother    Hypertension Father    Social History   Socioeconomic History   Marital status: Married    Spouse name: Not on file   Number of children: Not on file   Years of education: Not on file   Highest education level: Not on file  Occupational History   Not on file  Tobacco Use   Smoking status: Former Smoker    Types: Cigarettes    Quit date: 07/10/1963    Years since quitting: 57.3   Smokeless tobacco: Never Used  Substance and Sexual Activity   Alcohol use: No   Drug use: No   Sexual activity: Not Currently  Other Topics Concern   Not on file  Social History Narrative   Not on file   Social Determinants of Health   Financial Resource Strain: Low Risk    Difficulty of Paying Living Expenses: Not hard at all  Food Insecurity: No Food Insecurity   Worried About Charity fundraiser in the Last Year: Never true   [place] in the Last Year: Never true  Transportation Needs: No Transportation Needs   Lack of Transportation (Medical): No   Lack of Transportation (Non-Medical): No  Physical Activity: Inactive   Days of Exercise per Week: 0 days   Minutes of Exercise per Session: 0 min   Stress: No Stress Concern Present   Feeling of Stress : Not at all  Social Connections: Unknown   Frequency of Communication with Friends and Family: Not on file   Frequency of Social Gatherings with Friends and Family: Not on file   Attends Religious Services: Not on file   Active Member of Clubs or Organizations: Not on file   Attends Archivist Meetings: Not on file   Marital Status: Married    Tobacco Counseling Counseling given: Not Answered   Clinical Intake:  Pre-visit preparation completed: Yes        Diabetes: No  How often do you need to have someone  help you when you read instructions, pamphlets, or other written materials from your doctor or pharmacy?: 1 - Never   Interpreter Needed?: No      Activities of Daily Living In your present state of health, do you have any difficulty performing the following activities: 11/17/2020  Hearing? Y  Comment Hearing aids  Vision? N  Difficulty concentrating or making decisions? N  Walking or climbing stairs? Y  Comment Unsteady gait. Cane in use when ambulating.  Dressing or bathing? N  Doing errands, shopping? N  Preparing Food and eating ? N  Using the Toilet? N  In the past six months, have you accidently leaked urine? N  Do you have problems with loss of bowel control? N  Managing your Medications? N  Managing your Finances? N  Housekeeping or managing your Housekeeping? N  Some recent data might be hidden    Patient Care Team: [name], MD as PCP - General (Internal Medicine)  Indicate any recent Medical Services you may have received from other than [facility] providers in the past year (date may be approximate).     Assessment:   This is a routine wellness examination for [place].  I connected with Lesly today by telephone and verified that I am speaking with the correct person using two identifiers. Location patient: home Location provider: work Persons participating in the virtual  visit: patient, Marine scientist.    I discussed the limitations, risks, security and privacy concerns of performing an evaluation and management service by telephone and the availability of in person appointments. The patient expressed understanding and verbally consented to this telephonic visit.    Interactive audio and video telecommunications were attempted between this provider and patient, however failed, due to patient having technical difficulties OR patient did not have access to video capability.  We continued and completed visit with audio only.  Some vital signs may be absent or patient reported.   Hearing/Vision screen  Hearing Screening   125Hz  250Hz  500Hz  1000Hz  2000Hz  3000Hz  4000Hz  6000Hz  8000Hz   Right ear:           Left ear:           Comments: Hearing aid, bilateral   Vision Screening Comments: Followed by [facility])  Wears corrective lenses  Cataract extraction, bilateral  Visual acuity not assessed per patient preference since they have regular follow up with the ophthalmologist  Dietary issues and exercise activities discussed: Current Exercise Habits: The patient does not participate in regular exercise at present  Healthy diet Good water intake  Goals      Follow up with Primary Care Provider     As needed Increase physical activity: chair exercises, walking       Depression Screen PHQ 2/9 Scores 11/17/2020 11/14/2019 08/04/2019 11/11/2018 11/06/2017 10/11/2017 08/23/2017  PHQ - 2 Score 0 0 0 0 0 0 0  PHQ- 9 Score - - 0 - 0 - -    Fall Risk Fall Risk  11/17/2020 07/23/2020 11/14/2019 11/14/2019 08/04/2019  Falls in the past year? 0 0 0 0 1  Number falls in past yr: 0 - - - 0  Injury with Fall? 0 - - - 1  Risk Factor Category  - - - - -  Comment - - - - -  Risk for fall due to : - - - - -  Follow up Falls evaluation completed Falls evaluation completed Falls evaluation completed Falls prevention discussed Falls evaluation completed    FALL  RISK  PREVENTION PERTAINING TO THE HOME: Handrails in use when climbing stairs? Yes Home free of loose throw rugs in walkways, pet beds, electrical cords, etc? Yes  Adequate lighting in your home to reduce risk of falls? Yes   ASSISTIVE DEVICES UTILIZED TO PREVENT FALLS: Life alert? No  Use of a cane, walker or w/c? Yes  Grab bars in the bathroom? Yes  Shower chair or bench in shower? Yes  Elevated toilet seat or a handicapped toilet? Yes   TIMED UP AND GO: Was the test performed? No . Virtual visit.   Cognitive Function:  Patient is alert and oriented x3.    6CIT Screen 11/17/2020 11/14/2019 11/11/2018 11/06/2017 11/06/2016  What Year? 0 points 0 points 0 points 0 points 0 points  What month? 0 points 0 points 0 points 0 points 0 points  What time? 0 points 0 points 0 points 0 points 0 points  Count back from 20 0 points 0 points 0 points 0 points 0 points  Months in reverse 0 points 0 points 0 points 0 points 0 points  Repeat phrase - 2 points 0 points 0 points 0 points  Total Score - 2 0 0 0     Immunizations Immunization History  Administered Date(s) Administered   Fluad Quad(high Dose 65+) 08/04/2019   Influenza Split 11/11/2012, 09/25/2014   Influenza, High Dose Seasonal PF 10/15/2013, 10/01/2015, 08/13/2018   Influenza-Unspecified 10/01/2016, 08/31/2017   PFIZER SARS-COV-2 Vaccination 12/15/2019, 01/05/2020, 07/18/2020   Pneumococcal Conjugate-13 11/25/2014, 04/03/2015   Pneumococcal Polysaccharide-23 11/25/2012   Tdap 08/15/2017   Zoster 04/10/2011   Zoster Recombinat (Shingrix) 09/11/2019   Health Maintenance Health Maintenance  Topic Date Due   INFLUENZA VACCINE  03/03/2021 (Originally 07/04/2020)   TETANUS/TDAP  08/16/2027   COVID-19 Vaccine  Completed   PNA vac Low Risk Adult  Completed   Colorectal cancer screening: No longer required.   Lung Cancer Screening: (Low Dose CT Chest recommended if Age 30-80 years, 30 pack-year currently smoking OR have quit w/in  15years.) does not qualify.   Hepatitis C Screening: does not qualify.  Vision Screening: Recommended annual ophthalmology exams for early detection of glaucoma and other disorders of the eye. Is the patient up to date with their annual eye exam?  Yes  Who is the provider or what is the name of the office in which the patient attends annual eye exams? [facility].  Dental Screening: Recommended annual dental exams for proper oral hygiene. Dentures.   Community Resource Referral / Chronic Care Management: CRR required this visit?  No   CCM required this visit?  No      Plan:   Keep all routine maintenance appointments.   I have personally reviewed and noted the following in the patient's chart:   Medical and social history Use of alcohol, tobacco or illicit drugs  Current medications and supplements Functional ability and status Nutritional status Physical activity Advanced directives List of other physicians Hospitalizations, surgeries, and ER visits in previous 12 months Vitals Screenings to include cognitive, depression, and falls Referrals and appointments  In addition, I have reviewed and discussed with patient certain preventive protocols, quality metrics, and best practice recommendations. A written personalized care plan for preventive services as well as general preventive health recommendations were provided to patient via mychart.     OBrien-Blaney, Ein Rijo L, LPN   [id number]     I have reviewed the above information and agree with above.   Deborra Medina, MD

## 2020-11-18 DIAGNOSIS — H6063 Unspecified chronic otitis externa, bilateral: Secondary | ICD-10-CM | POA: Diagnosis not present

## 2020-11-18 DIAGNOSIS — H6123 Impacted cerumen, bilateral: Secondary | ICD-10-CM | POA: Diagnosis not present

## 2020-11-18 DIAGNOSIS — H903 Sensorineural hearing loss, bilateral: Secondary | ICD-10-CM | POA: Diagnosis not present

## 2020-11-24 DIAGNOSIS — M48062 Spinal stenosis, lumbar region with neurogenic claudication: Secondary | ICD-10-CM | POA: Diagnosis not present

## 2020-12-03 ENCOUNTER — Other Ambulatory Visit: Payer: Self-pay | Admitting: Internal Medicine

## 2020-12-15 DIAGNOSIS — G894 Chronic pain syndrome: Secondary | ICD-10-CM | POA: Diagnosis not present

## 2020-12-15 DIAGNOSIS — M4316 Spondylolisthesis, lumbar region: Secondary | ICD-10-CM | POA: Diagnosis not present

## 2020-12-15 DIAGNOSIS — M48061 Spinal stenosis, lumbar region without neurogenic claudication: Secondary | ICD-10-CM | POA: Diagnosis not present

## 2020-12-15 DIAGNOSIS — M47816 Spondylosis without myelopathy or radiculopathy, lumbar region: Secondary | ICD-10-CM | POA: Diagnosis not present

## 2020-12-29 DIAGNOSIS — R350 Frequency of micturition: Secondary | ICD-10-CM | POA: Diagnosis not present

## 2021-01-11 DIAGNOSIS — D0422 Carcinoma in situ of skin of left ear and external auricular canal: Secondary | ICD-10-CM | POA: Diagnosis not present

## 2021-01-11 DIAGNOSIS — D2261 Melanocytic nevi of right upper limb, including shoulder: Secondary | ICD-10-CM | POA: Diagnosis not present

## 2021-01-11 DIAGNOSIS — D2262 Melanocytic nevi of left upper limb, including shoulder: Secondary | ICD-10-CM | POA: Diagnosis not present

## 2021-01-11 DIAGNOSIS — D2272 Melanocytic nevi of left lower limb, including hip: Secondary | ICD-10-CM | POA: Diagnosis not present

## 2021-01-11 DIAGNOSIS — D225 Melanocytic nevi of trunk: Secondary | ICD-10-CM | POA: Diagnosis not present

## 2021-01-11 DIAGNOSIS — R208 Other disturbances of skin sensation: Secondary | ICD-10-CM | POA: Diagnosis not present

## 2021-01-11 DIAGNOSIS — X32XXXA Exposure to sunlight, initial encounter: Secondary | ICD-10-CM | POA: Diagnosis not present

## 2021-01-11 DIAGNOSIS — D485 Neoplasm of uncertain behavior of skin: Secondary | ICD-10-CM | POA: Diagnosis not present

## 2021-01-11 DIAGNOSIS — Z85828 Personal history of other malignant neoplasm of skin: Secondary | ICD-10-CM | POA: Diagnosis not present

## 2021-01-11 DIAGNOSIS — L570 Actinic keratosis: Secondary | ICD-10-CM | POA: Diagnosis not present

## 2021-01-11 DIAGNOSIS — C44622 Squamous cell carcinoma of skin of right upper limb, including shoulder: Secondary | ICD-10-CM | POA: Diagnosis not present

## 2021-02-02 DIAGNOSIS — D2361 Other benign neoplasm of skin of right upper limb, including shoulder: Secondary | ICD-10-CM | POA: Diagnosis not present

## 2021-02-02 DIAGNOSIS — L0101 Non-bullous impetigo: Secondary | ICD-10-CM | POA: Diagnosis not present

## 2021-02-02 DIAGNOSIS — C44622 Squamous cell carcinoma of skin of right upper limb, including shoulder: Secondary | ICD-10-CM | POA: Diagnosis not present

## 2021-02-09 DIAGNOSIS — M47816 Spondylosis without myelopathy or radiculopathy, lumbar region: Secondary | ICD-10-CM | POA: Diagnosis not present

## 2021-02-09 DIAGNOSIS — M48061 Spinal stenosis, lumbar region without neurogenic claudication: Secondary | ICD-10-CM | POA: Diagnosis not present

## 2021-02-09 DIAGNOSIS — G894 Chronic pain syndrome: Secondary | ICD-10-CM | POA: Diagnosis not present

## 2021-02-09 DIAGNOSIS — M4316 Spondylolisthesis, lumbar region: Secondary | ICD-10-CM | POA: Diagnosis not present

## 2021-02-11 ENCOUNTER — Other Ambulatory Visit: Payer: Self-pay

## 2021-02-11 ENCOUNTER — Ambulatory Visit (INDEPENDENT_AMBULATORY_CARE_PROVIDER_SITE_OTHER): Payer: Medicare Other | Admitting: Internal Medicine

## 2021-02-11 ENCOUNTER — Encounter: Payer: Self-pay | Admitting: Internal Medicine

## 2021-02-11 VITALS — BP 130/70 | HR 67 | Temp 97.6°F | Ht 70.0 in | Wt 202.8 lb

## 2021-02-11 DIAGNOSIS — Z4802 Encounter for removal of sutures: Secondary | ICD-10-CM | POA: Diagnosis not present

## 2021-02-11 DIAGNOSIS — L03211 Cellulitis of face: Secondary | ICD-10-CM | POA: Diagnosis not present

## 2021-02-11 NOTE — Patient Instructions (Signed)
Dove antibacterial body wash  Or  Hibiclens  Or  Chlorhexadine   All are antibacterial soaps

## 2021-02-11 NOTE — Progress Notes (Addendum)
Chief Complaint  Patient presents with  . Skin Problem    Scabbed area on the left side of the face along the left ear    F/u  1. Left preauricular used 18fu and calciprotriene 0.005% cream had bx+SCC  w/in the last few weeks with Dr. Evorn Gong which bx site + cellulitis and was Rx doxycycline bid, and bactroban was not getting better . He did They did wound culture and was changed to cipro 250 mg bid and area is getting better but scabbing but redness in front of ear is getting better  2. Right forearm s/p excision of scc and wants sutures out today he is sch to get them out 02/16/21   Review of Systems  Respiratory: Negative for shortness of breath.   Cardiovascular: Negative for chest pain.  Skin: Positive for rash.   Past Medical History:  Diagnosis Date  . 3-vessel coronary artery disease 2006   [place]  . basal cell   . Basal cell carcinoma 07/09/2012  . BPH (benign prostatic hyperplasia)   . GERD (gastroesophageal reflux disease)   . Hyperlipidemia   . Hypertension   . Lumbar stenosis 12/03/13   MRI - lumbar pain with radiation down to LE  . Myocardial infarction ([place]) 2006  . OAB (overactive bladder)   . Osteoporosis 2010   by DEXA at [facility]  . Sleep apnea   . Spondylolisthesis of lumbar region 11/23/13   MRI Lumbar spine   Past Surgical History:  Procedure Laterality Date  . APPENDECTOMY  1951  . COLONOSCOPY    . COLONOSCOPY WITH PROPOFOL N/A 11/15/2015   Procedure: COLONOSCOPY WITH PROPOFOL;  Surgeon: Hulen Luster, MD;  Location: [facility] ENDOSCOPY;  Service: Gastroenterology;  Laterality: N/A;  . CORONARY ARTERY BYPASS GRAFT  2006   3 vessel, s/p AMI  . NASAL SEPTOPLASTY W/ TURBINOPLASTY  1981  . TONSILLECTOMY     Family History  Problem Relation Age of Onset  . Hyperlipidemia Mother   . Hypertension Mother   . Hypertension Father    Social History   Socioeconomic History  . Marital status: Married    Spouse name: Not on file  . Number of children: Not on file  .  Years of education: Not on file  . Highest education level: Not on file  Occupational History  . Not on file  Tobacco Use  . Smoking status: Former Smoker    Types: Cigarettes    Quit date: 07/10/1963    Years since quitting: 57.6  . Smokeless tobacco: Never Used  Substance and Sexual Activity  . Alcohol use: No  . Drug use: No  . Sexual activity: Not Currently  Other Topics Concern  . Not on file  Social History Narrative  . Not on file   Social Determinants of Health   Financial Resource Strain: Low Risk   . Difficulty of Paying Living Expenses: Not hard at all  Food Insecurity: No Food Insecurity  . Worried About Charity fundraiser in the Last Year: Never true  . Ran Out of Food in the Last Year: Never true  Transportation Needs: No Transportation Needs  . Lack of Transportation (Medical): No  . Lack of Transportation (Non-Medical): No  Physical Activity: Inactive  . Days of Exercise per Week: 0 days  . Minutes of Exercise per Session: 0 min  Stress: No Stress Concern Present  . Feeling of Stress : Not at all  Social Connections: Unknown  . Frequency of Communication with Friends  and Family: Not on file  . Frequency of Social Gatherings with Friends and Family: Not on file  . Attends Religious Services: Not on file  . Active Member of Clubs or Organizations: Not on file  . Attends Archivist Meetings: Not on file  . Marital Status: Married  Human resources officer Violence: Not At Risk  . Fear of Current or Ex-Partner: No  . Emotionally Abused: No  . Physically Abused: No  . Sexually Abused: No   Current Meds  Medication Sig  . acetaminophen (TYLENOL) 500 MG tablet Take 500 mg by mouth at bedtime.   Marland Kitchen amLODipine (NORVASC) 2.5 MG tablet TAKE 1 TABLET BY MOUTH  DAILY  . aspirin 81 MG EC tablet Take 81 mg by mouth daily.   . Azelastine-Fluticasone 137-50 MCG/ACT SUSP Place 1 spray into both nostrils 2 (two) times daily as needed.  . Blood Pressure Monitoring  (BLOOD PRESSURE MONITOR AUTOMAT) DEVI Use daily to check blood pressure  . cholecalciferol (VITAMIN D) 25 MCG (1000 UNIT) tablet   . ciprofloxacin (CIPRO) 250 MG tablet Take 250 mg by mouth 2 (two) times daily.  . cyanocobalamin (,VITAMIN B-12,) 1000 MCG/ML injection INJECT 1 ML (1,000 MCG TOTAL) INTO THE MUSCLE ONCE A WEEK. FOR 4 WEEKS, THEN MONTHLY THEREAFTER  . diclofenac Sodium (VOLTAREN) 1 % GEL APPLY 4 GRAMS TOPICALLY 4   TIMES DAILY.  . finasteride (PROSCAR) 5 MG tablet TAKE 1 TABLET BY MOUTH  DAILY  . gabapentin (NEURONTIN) 100 MG capsule Take 3 capsules (300 mg total) by mouth daily. In divided doses  . lisinopril (ZESTRIL) 20 MG tablet TAKE 1 TABLET BY MOUTH  DAILY  . metoprolol tartrate (LOPRESSOR) 25 MG tablet TAKE ONE-HALF TABLET BY  MOUTH TWICE DAILY  . nitroGLYCERIN (NITROSTAT) 0.4 MG SL tablet Place 1 tablet (0.4 mg total) under the tongue every 5 (five) minutes as needed for chest pain. Maximum of 3 doses.  Marland Kitchen omeprazole (PRILOSEC) 20 MG capsule TAKE 2 CAPSULES BY MOUTH  DAILY  . oxybutynin (DITROPAN) 5 MG tablet TAKE 1 TABLET BY MOUTH  TWICE DAILY  . polyethylene glycol powder (GLYCOLAX/MIRALAX) powder Take 17 g by mouth once. Dissolve 1 capful of power into any liquid and drink once daily (Patient taking differently: Take 17 g by mouth at bedtime. Dissolve 1 capful of power into any liquid and drink once daily)  . rOPINIRole (REQUIP) 0.25 MG tablet TAKE 1 TABLET BY MOUTH AT  BEDTIME.  . Simethicone Extra Strength 125 MG CAPS Take 125 mg by mouth as needed (gas).  . simvastatin (ZOCOR) 20 MG tablet TAKE 1 TABLET BY MOUTH AT  BEDTIME  . Syringe/Needle, Disp, (SYRINGE 3CC/25GX1") 25G X 1" 3 ML MISC Use for b12 injections  . tamsulosin (FLOMAX) 0.4 MG CAPS capsule TAKE ONE CAPSULE BY MOUTH  EVERY DAY AFTER BREAKFAST   Allergies  Allergen Reactions  . Sulfa Antibiotics Rash   No results found for this or any previous visit (from the past 2160 hour(s)). Objective  Body mass index  is 29.1 kg/m. Wt Readings from Last 3 Encounters:  02/11/21 202 lb 12.8 oz (92 kg)  11/17/20 200 lb (90.7 kg)  11/16/20 200 lb (90.7 kg)   Temp Readings from Last 3 Encounters:  02/11/21 97.6 F (36.4 C) (Oral)  03/29/20 98.2 F (36.8 C) (Temporal)  11/14/19 (!) 94.7 F (34.8 C) (Oral)   BP Readings from Last 3 Encounters:  02/11/21 130/70  11/16/20 (!) 164/77  07/23/20 127/66   Pulse Readings  from Last 3 Encounters:  02/11/21 67  11/16/20 64  07/23/20 61    Physical Exam Vitals and nursing note reviewed.  Constitutional:      Appearance: Normal appearance. He is well-developed and well-groomed.  HENT:     Head: Normocephalic and atraumatic.   Cardiovascular:     Rate and Rhythm: Normal rate and regular rhythm.     Heart sounds: Normal heart sounds. No murmur heard.   Pulmonary:     Effort: Pulmonary effort is normal.     Breath sounds: Normal breath sounds.  Skin:    General: Skin is warm and dry.       Neurological:     General: No focal deficit present.     Mental Status: He is alert and oriented to person, place, and time. Mental status is at baseline.     Gait: Gait normal.  Psychiatric:        Attention and Perception: Attention and perception normal.        Mood and Affect: Mood and affect normal.        Speech: Speech normal.        Behavior: Behavior normal. Behavior is cooperative.        Thought Content: Thought content normal.        Cognition and Memory: Cognition and memory normal.        Judgment: Judgment normal.     Assessment  Plan  Cellulitis of face left preauricular with scab at prior bx site SCC healing Cleaned with hydrogen peroxide and placed bactroban on the site Continue to use bactroban, antibacterial soap otc and continue cipro 250 mg bid since 02/07/21  Stopped doxycycline 100 mg bid Will f/u Dr. Evorn Gong in 4 months  Visit for suture removal  Running suture right forearm Cleaned with hydrogen peroxide bactroban with  sterile qtip Steri strips and coban  Sent ROI from Dr. Evorn Gong pathology and wound culture  Provider: Dr. Olivia Mackie McLean-Scocuzza-Internal Medicine

## 2021-03-08 DIAGNOSIS — M4316 Spondylolisthesis, lumbar region: Secondary | ICD-10-CM | POA: Diagnosis not present

## 2021-03-14 ENCOUNTER — Encounter: Payer: Self-pay | Admitting: Internal Medicine

## 2021-03-14 ENCOUNTER — Ambulatory Visit (INDEPENDENT_AMBULATORY_CARE_PROVIDER_SITE_OTHER): Payer: Medicare Other | Admitting: Internal Medicine

## 2021-03-14 ENCOUNTER — Other Ambulatory Visit: Payer: Self-pay

## 2021-03-14 VITALS — BP 142/66 | HR 70 | Temp 98.3°F | Resp 15 | Ht 70.0 in | Wt 199.6 lb

## 2021-03-14 DIAGNOSIS — E860 Dehydration: Secondary | ICD-10-CM | POA: Diagnosis not present

## 2021-03-14 DIAGNOSIS — N401 Enlarged prostate with lower urinary tract symptoms: Secondary | ICD-10-CM | POA: Diagnosis not present

## 2021-03-14 DIAGNOSIS — R55 Syncope and collapse: Secondary | ICD-10-CM | POA: Diagnosis not present

## 2021-03-14 DIAGNOSIS — R3915 Urgency of urination: Secondary | ICD-10-CM | POA: Diagnosis not present

## 2021-03-14 DIAGNOSIS — D510 Vitamin B12 deficiency anemia due to intrinsic factor deficiency: Secondary | ICD-10-CM | POA: Diagnosis not present

## 2021-03-14 NOTE — Progress Notes (Signed)
Subjective:  Patient ID: David Atkins, male    DOB: August 09, 1929  Age: 87 y.o. MRN: [id number]  CC: The primary encounter diagnosis was Pernicious anemia. Diagnoses of Dehydration after exertion, Benign prostatic hyperplasia (BPH) with urinary urgency, and Recurrent syncope were also pertinent to this visit.  HPI David Atkins presents for follow up on chronic issues including hypertension . CAD , BPH with LUTs, and recent syncopal episode. Last seen August 2021  This visit occurred during the SARS-CoV-2 public health emergency.  Safety protocols were in place, including screening questions prior to the visit, additional usage of staff PPE, and extensive cleaning of exam room while observing appropriate contact time as indicated for disinfecting solutions.   David Atkins is an 87 yr old male with a history of CAD, HTn and BPH who presents after his 4th syncopal episode in 6 years. Previous workups have attributed his syncope  to "Dehydration " because each episode occurs when he allows himself to get very tired.  He usually gets a warning feeling , which gives him time to sit down and prevent injury before he collapses. He states that he feels very weak afterward which has improved with hydration. However he reports today that each episode has resulted in loss of either bowel or bladder control.  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  The syncope was accompanied by loss of bowel control (mixed solid and liquid)  .  BP after episode was 112/66 .  Has NOT seen neurology  To rule out seizure  No episode have occurred while driving, does not wake up confused.    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  Standing most of the time.   which "preserves my sanity. "  Outpatient Medications Prior to Visit  Medication Sig Dispense Refill  .  acetaminophen (TYLENOL) 500 MG tablet Take 500 mg by mouth at bedtime.     Marland Kitchen amLODipine (NORVASC) 2.5 MG tablet TAKE 1 TABLET BY MOUTH  DAILY 90 tablet 3  . aspirin 81 MG EC tablet Take 81 mg by mouth daily.     . Azelastine-Fluticasone 137-50 MCG/ACT SUSP Place 1 spray into both nostrils 2 (two) times daily as needed. 23 g 5  . Blood Pressure Monitoring (BLOOD PRESSURE MONITOR AUTOMAT) DEVI Use daily to check blood pressure 1 Device 0  . cholecalciferol (VITAMIN D) 25 MCG (1000 UNIT) tablet     . cyanocobalamin (,VITAMIN B-12,) 1000 MCG/ML injection INJECT 1 ML (1,000 MCG TOTAL) INTO THE MUSCLE ONCE A WEEK. FOR 4 WEEKS, THEN MONTHLY THEREAFTER 12 mL 1  . diclofenac Sodium (VOLTAREN) 1 % GEL APPLY 4 GRAMS TOPICALLY 4   TIMES DAILY. 400 g 0  . finasteride (PROSCAR) 5 MG tablet TAKE 1 TABLET BY MOUTH  DAILY 90 tablet 3  . lisinopril (ZESTRIL) 20 MG tablet TAKE 1 TABLET BY MOUTH  DAILY 90 tablet 3  . metoprolol tartrate (LOPRESSOR) 25 MG tablet TAKE ONE-HALF TABLET BY  MOUTH TWICE DAILY 90 tablet 3  . nitroGLYCERIN (NITROSTAT) 0.4 MG SL tablet Place 1 tablet (0.4 mg total) under the tongue every 5 (five) minutes as needed for chest pain. Maximum of 3 doses. 25 tablet 3  . omeprazole (PRILOSEC) 20 MG capsule TAKE 2 CAPSULES BY MOUTH  DAILY 180 capsule 3  .  oxybutynin (DITROPAN) 5 MG tablet TAKE 1 TABLET BY MOUTH  TWICE DAILY 180 tablet 3  . polyethylene glycol powder (GLYCOLAX/MIRALAX) powder Take 17 g by mouth once. Dissolve 1 capful of power into any liquid and drink once daily (Patient taking differently: Take 17 g by mouth at bedtime. Dissolve 1 capful of power into any liquid and drink once daily) 500 g 0  . rOPINIRole (REQUIP) 0.25 MG tablet TAKE 1 TABLET BY MOUTH AT  BEDTIME. 90 tablet 3  . Simethicone Extra Strength 125 MG CAPS Take 125 mg by mouth as needed (gas).    . simvastatin (ZOCOR) 20 MG tablet TAKE 1 TABLET BY MOUTH AT  BEDTIME 90 tablet 3  . Syringe/Needle, Disp, (SYRINGE 3CC/25GX1")  25G X 1" 3 ML MISC Use for b12 injections 50 each 0  . tamsulosin (FLOMAX) 0.4 MG CAPS capsule TAKE ONE CAPSULE BY MOUTH  EVERY DAY AFTER BREAKFAST 90 capsule 3  . ciprofloxacin (CIPRO) 250 MG tablet Take 250 mg by mouth 2 (two) times daily. (Patient not taking: Reported on 03/14/2021)    . doxycycline (VIBRAMYCIN) 100 MG capsule Take 100 mg by mouth 2 (two) times daily. (Patient not taking: No sig reported)    . fluorouracil (EFUDEX) 5 % cream FLUOROURACIL 5% + CALCIPOTRIENE 0.005% (Patient not taking: No sig reported)    . gabapentin (NEURONTIN) 100 MG capsule Take 3 capsules (300 mg total) by mouth daily. In divided doses (Patient not taking: Reported on 03/14/2021) 90 capsule 3  . mupirocin ointment (BACTROBAN) 2 % Apply topically. (Patient not taking: No sig reported)     No facility-administered medications prior to visit.    Review of Systems;  Patient denies headache, fevers, malaise, unintentional weight loss, skin rash, eye pain, sinus congestion and sinus pain, sore throat, dysphagia,  hemoptysis , cough, dyspnea, wheezing, chest pain, palpitations, orthopnea, edema, abdominal pain, nausea, melena, diarrhea, constipation, flank pain, dysuria, hematuria, urinary  Frequency, nocturia, numbness, tingling, seizures,  Focal weakness,  Tremor, insomnia, depression, anxiety, and suicidal ideation.      Objective:  BP (!) 142/66 (BP Location: Left Arm, Patient Position: Sitting, Cuff Size: Normal)   Pulse 70   Temp 98.3 F (36.8 C) (Oral)   Resp 15   Ht 5\' 10"  (1.778 m)   Wt 199 lb 9.6 oz (90.5 kg)   SpO2 95%   BMI 28.64 kg/m   BP Readings from Last 3 Encounters:  03/14/21 (!) 142/66  02/11/21 130/70  11/16/20 (!) 164/77    Wt Readings from Last 3 Encounters:  03/14/21 199 lb 9.6 oz (90.5 kg)  02/11/21 202 lb 12.8 oz (92 kg)  11/17/20 200 lb (90.7 kg)    General appearance: alert, cooperative and appears stated age Ears: normal TM's and external ear canals both  ears Throat: lips, mucosa, and tongue normal; teeth and gums normal Neck: no adenopathy, no carotid bruit, supple, symmetrical, trachea midline and thyroid not enlarged, symmetric, no tenderness/mass/nodules Back: symmetric, no curvature. ROM normal. No CVA tenderness. Lungs: clear to auscultation bilaterally Heart: regular rate and rhythm, S1, S2 normal, no murmur, click, rub or gallop Abdomen: soft, non-tender; bowel sounds normal; no masses,  no organomegaly Pulses: 2+ and symmetric Skin: Skin color, texture, turgor normal. No rashes or lesions Lymph nodes: Cervical, supraclavicular, and axillary nodes normal. Neuro:  awake and interactive with normal mood and affect. Higher cortical functions are normal. Speech is clear without word-finding difficulty or dysarthria. Extraocular movements are intact. Visual fields of both eyes  are grossly intact. Sensation to light touch is grossly intact bilaterally of upper and lower extremities. Motor examination shows 4+/5 symmetric hand grip and upper extremity and 5/5 lower extremity strength. There is no pronation or drift. Gait is non-ataxic    Lab Results  Component Value Date   HGBA1C 5.7 11/17/2019   HGBA1C 5.7 02/08/2018   HGBA1C 5.6 11/02/2017    Lab Results  Component Value Date   CREATININE 0.93 08/27/2020   CREATININE 0.92 11/17/2019   CREATININE 0.86 12/15/2018    Lab Results  Component Value Date   WBC 7.2 08/27/2020   HGB 14.1 08/27/2020   HCT 41.8 08/27/2020   PLT 208 08/27/2020   GLUCOSE 83 08/27/2020   CHOL 125 11/17/2019   TRIG 107.0 11/17/2019   HDL 42.00 11/17/2019   LDLDIRECT CANCELED 11/02/2017   LDLCALC 62 11/17/2019   ALT 15 08/27/2020   AST 18 08/27/2020   NA 141 08/27/2020   K 4.2 08/27/2020   CL 106 08/27/2020   CREATININE 0.93 08/27/2020   BUN 16 08/27/2020   CO2 28 08/27/2020   TSH 1.45 08/27/2020   HGBA1C 5.7 11/17/2019   MICROALBUR <0.7 02/08/2018    MR LUMBAR SPINE WO CONTRAST  Result  Date: 08/15/2020 CLINICAL DATA:  Low back pain.  Bilateral leg pain. EXAM: MRI LUMBAR SPINE WITHOUT CONTRAST TECHNIQUE: Multiplanar, multisequence MR imaging of the lumbar spine was performed. No intravenous contrast was administered. COMPARISON:  MRI of the lumbar spine 12/03/2013 FINDINGS: Segmentation: 5 non rib-bearing lumbar type vertebral bodies are present. The lowest fully formed vertebral body is L5. Alignment: Slight retrolisthesis is again noted T12-L1 and L1-2. 6 mm grade 1 anterolisthesis at L4-5 has increased. Mild rightward curvature is centered at L2-3. Vertebrae: Scattered fatty infiltration of the marrow spaces has increased. A remote T12 fracture is present. Progressive Schmorl's nodes are present along the superior endplate of L1, at G9-9, L2-3, and L3-4. Conus medullaris and cauda equina: Conus extends to the T12-L1 level. Conus and cauda equina appear normal. Paraspinal and other soft tissues: Limited imaging the abdomen is unremarkable. There is no significant adenopathy. No solid organ lesions are present. Disc levels: T11-12: Negative. T12-L1: Retrolisthesis is noted. Central canal is patent. Mild foraminal narrowing is present bilaterally. L1-2: Mild facet hypertrophy is worse on the left. Uncovering of broad-based protrusion is noted. Mild subarticular narrowing is present bilaterally. Moderate foraminal narrowing is worse on the left, similar the prior study. L2-3: Broad-based disc protrusion is present. Mild facet hypertrophy is worse on the left. Central canal is patent. Mild foraminal narrowing is worse on the left. L3-4: A broad-based disc protrusion is present. Moderate facet hypertrophy is noted. Mild left subarticular narrowing has progressed. Moderate foraminal narrowing is present bilaterally, similar the prior exam. L4-5: Progressive anterolisthesis is present with uncovering of broad-based disc protrusion. Advanced facet hypertrophy is progressed as well. This results in  progressive moderate to severe central canal stenosis. Moderate foraminal narrowing has progressed bilaterally as well. L5-S1: Moderate right-sided facet hypertrophy has progressed. No significant disc protrusion or stenosis is present. IMPRESSION: 1. Progressive multilevel spondylosis of the lumbar spine as described. 2. Mild foraminal narrowing bilaterally at T12-L1. 3. Mild subarticular and moderate foraminal narrowing bilaterally at L1-2 is worse on the left, similar the prior study. 4. Mild foraminal narrowing bilaterally at L2-3 is worse on the left. 5. Mild left subarticular narrowing at L3-4 has progressed. Moderate foraminal narrowing bilaterally is similar the prior exam. 6. Progressive moderate to severe central  canal stenosis and moderate foraminal narrowing bilaterally at L4-5. 7. Moderate right-sided facet hypertrophy has progressed at L5-S1 without significant stenosis. Electronically Signed   By: San Morelle M.D.   On: 08/15/2020 08:26    Assessment & Plan:   Problem List Items Addressed This Visit      Unprioritized   Recurrent syncope    Most recently occurring 3 weeks ago,  Did not seek medical attention.  He recalls that this was his 4th event in 6 years  Previous one was over a year ago Jan 2020 and he was admitted overnight for cardiac workup which was negative per DC summary .  Last MRI brain was negative for masses and strokes in  2019,  But noted "Several chronic micro hemorrhages in the left hemisphere with bilateral cerebral white matter and deep gray matter signal changes most compatible with chronic small vessel disease."  (done to evaluate for causes of poor balance).  Jan 2020 CT head done during admission for syncope was normal except for chronic small vessel disease .  Because he is now reporting that each episode was accompaneid by low of bowel and/or bladder control,  Non epileptic seizure is a consideration.  Neurology referral advised and in process        Relevant Orders   Ambulatory referral to Neurology    Other Visit Diagnoses    Pernicious anemia    -  Primary   Relevant Orders   CBC with Differential/Platelet   Vitamin B12   Dehydration after exertion       Relevant Orders   Magnesium   Comprehensive metabolic panel   Benign prostatic hyperplasia (BPH) with urinary urgency       Relevant Orders   Urine Culture   Urinalysis, Routine w reflex microscopic (Completed)      I have discontinued David Atkins's gabapentin, fluorouracil, ciprofloxacin, mupirocin ointment, and doxycycline. I am also having him maintain his acetaminophen, Blood Pressure Monitor Automat, polyethylene glycol powder, aspirin, Simethicone Extra Strength, cholecalciferol, Azelastine-Fluticasone, nitroGLYCERIN, omeprazole, amLODipine, oxybutynin, rOPINIRole, lisinopril, finasteride, tamsulosin, SYRINGE 3CC/25GX1", diclofenac Sodium, metoprolol tartrate, simvastatin, and cyanocobalamin.  No orders of the defined types were placed in this encounter.   Medications Discontinued During This Encounter  Medication Reason  . doxycycline (VIBRAMYCIN) 100 MG capsule   . fluorouracil (EFUDEX) 5 % cream   . gabapentin (NEURONTIN) 100 MG capsule   . ciprofloxacin (CIPRO) 250 MG tablet   . mupirocin ointment (BACTROBAN) 2 %     Follow-up: No follow-ups on file.   [name], MD

## 2021-03-14 NOTE — Patient Instructions (Signed)
I am making a referral to Neurology to evaluate your fainting spells which may be due to non epileptic seizures.    Non-Epileptic Seizures, Adult A non-epileptic seizure is an event that can cause abnormal movements or a loss of consciousness. These events differ from epileptic seizures because they are not caused by abnormal electrical and chemical activity in the brain. There are two types of non-epileptic seizures:  Physiologic. This type results from an underlying problem with body function.  Psychogenic. This type results from an underlying mental health disorder. What are the causes? The cause of this condition depends on the kind of non-epileptic seizure that you have. Causes of physiologic non-epileptic seizures  Sudden drop in blood pressure or heart rhythm problems.  Low blood sugar (glucose) or low levels of salt (sodium) in your blood.  Migraine.  Sleep disorders or movement disorders.  Certain medicines.  Heavy use of drugs or alcohol.  Head injuries. Causes of psychogenic non-epileptic seizures  Stress.  Emotional trauma.  Sexual or physical abuse.  Big life events, such as divorce or death of a loved one.  Mental health disorders, including anxiety and depression. What are the signs or symptoms? Symptoms of a non-epileptic seizure can be similar to those of an epileptic seizure. They may include:  A change in attention or behavior.  A loss of consciousness or fainting.  Uncontrollable shaking (convulsions) with fast, jerking movements.  Drooling, tongue biting, or grunting.  Rapid eye movements.  Being unable to control when you urinate or have bowel movements. After a non-epileptic seizure, you may:  Have a headache or sore muscles.  Feel confused or sleepy. Non-epileptic seizures usually:  Do not cause physical injuries.  Start slowly.  Include crying or shrieking.  Last longer than 2 minutes.  Include pelvic thrusting. How is this  diagnosed? Non-epileptic seizures may be diagnosed by medical history, physical exam, and symptoms. Your health care provider may:  Talk with your friends or relatives who have seen you have a seizure.  Ask you to write down your seizure activity and the things that led up to the seizure, and ask you to share that information with him or her. You may also have tests to look for causes of physiologic non-epileptic seizures. Tests may include:  An electroencephalogram (EEG) to check the electrical activity in your brain.  Video EEG in the hospital. This takes 2-7 days.  Blood tests.  An electrocardiogram (ECG) to check for an abnormal heart rhythm.  A CT scan. If your health care provider thinks you have had a psychogenic non-epileptic seizure, you may need to be evaluated by a mental health specialist.   How is this treated? The treatment for your non-epileptic seizures will depend on their cause. When the underlying condition is treated, your non-epileptic seizures should stop. Medicines to treat seizures do not help with non-epileptic events. If your non-epileptic seizures are being caused by emotional trauma or stress, your health care provider may recommend that you see a mental health specialist. Treatment may include:  Relaxation therapy or cognitive behavioral therapy (CBT).  Medicines for depression or anxiety.  Individual or family counseling. Some people have both psychogenic non-epileptic seizures and epileptic seizures. If you have both of these types, you may be prescribed medicine to manage the epileptic seizures. Follow these instructions at home: Home care will depend on the type of non-epileptic seizures that you have. Follow this general guidance: Avoid alcohol and drug use Avoid using any substance that may prevent  your medicine from working properly. If you are prescribed medicine for seizures:  Do not use drugs.  Limit or avoid drinking alcohol. If you drink  alcohol: ? Limit how much you have to:  0-1 drink a day for women who are not pregnant.  0-2 drinks a day for men. ? Know how much alcohol is in your drink. In the [place] one drink equals one 12 oz bottle of beer (355 mL), one 5 oz glass of wine (148 mL), or one 1 oz glass of hard liquor (44 mL). Involve family, friends, and coworkers Make sure family members, friends, and coworkers are trained in how to help you if you have a seizure. If you have a seizure, they should:  Lay you on the ground to prevent a fall.  Place a pillow or piece of clothing under your head.  Loosen any clothing around your neck.  Turn you onto your side. If you vomit, this position will help keep your windpipe clear. General instructions  Follow all instructions from your health care provider. These may include ways to prevent seizures and what to do if you have a seizure.  Take over-the-counter and prescription medicines only as told by your health care provider.  Keep all follow-up visits. This is important. Contact a health care provider if:  Your non-epileptic seizures change or happen more often.  Your non-epileptic seizures do not stop after treatment. Get help right away if:  You injure yourself during a non-epileptic seizure.  You have one non-epileptic seizure after another.  You have trouble recovering from a non-epileptic seizure.  You have chest pain or trouble breathing.  You have a non-epileptic seizure that lasts longer than 5 minutes. These symptoms may represent a serious problem that is an emergency. Do not wait to see if the symptoms will go away. Get medical help right away. Call your local emergency services (911 in the [place] Do not drive yourself to the hospital. If you ever feel like you may hurt yourself or others, or have thoughts about taking your own life, get help right away. Go to your nearest emergency department or:  Call your local emergency services (911 in the  [place]  Call a suicide crisis helpline, such as the [place] at (207)018-4256. This is open 24 hours a day in the [place]  Text the Crisis Text Line at 636-647-1768 (in the [place].). Summary  Non-epileptic seizures are events that can cause abnormal movements or a loss of consciousness. These events are caused by an underlying problem with body function or a mental health disorder.  The treatment for your non-epileptic seizures will depend on their cause. When the underlying condition is treated, your non-epileptic seizures should stop. Medicines for seizures do not treat non-epileptic events.  People with non-epileptic seizures may also have epileptic seizures and may need medicines to treat seizures.  Make sure family members, friends, and coworkers are trained in how to help you if you have a non-epileptic seizure. This includes laying you on the ground to prevent a fall, protecting your head and neck, and turning you onto your side. This information is not intended to replace advice given to you by your health care provider. Make sure you discuss any questions you have with your health care provider. Document Revised: 05/07/2020 Document Reviewed: 05/07/2020 Elsevier Patient Education  [employer].

## 2021-03-15 LAB — CBC WITH DIFFERENTIAL/PLATELET
Basophils Absolute: 0.1 10*3/uL (ref 0.0–0.1)
Basophils Relative: 1 % (ref 0.0–3.0)
Eosinophils Absolute: 0.2 10*3/uL (ref 0.0–0.7)
Eosinophils Relative: 2 % (ref 0.0–5.0)
HCT: 39.8 % (ref 39.0–52.0)
Hemoglobin: 13.5 g/dL (ref 13.0–17.0)
Lymphocytes Relative: 22.1 % (ref 12.0–46.0)
Lymphs Abs: 1.9 10*3/uL (ref 0.7–4.0)
MCHC: 34 g/dL (ref 30.0–36.0)
MCV: 96.5 fl (ref 78.0–100.0)
Monocytes Absolute: 0.6 10*3/uL (ref 0.1–1.0)
Monocytes Relative: 7.3 % (ref 3.0–12.0)
Neutro Abs: 5.9 10*3/uL (ref 1.4–7.7)
Neutrophils Relative %: 67.6 % (ref 43.0–77.0)
Platelets: 243 10*3/uL (ref 150.0–400.0)
RBC: 4.13 Mil/uL — ABNORMAL LOW (ref 4.22–5.81)
RDW: 12.8 % (ref 11.5–15.5)
WBC: 8.7 10*3/uL (ref 4.0–10.5)

## 2021-03-15 LAB — URINE CULTURE
MICRO NUMBER:: 11754699
SPECIMEN QUALITY:: ADEQUATE

## 2021-03-15 LAB — MAGNESIUM: Magnesium: 1.9 mg/dL (ref 1.5–2.5)

## 2021-03-15 LAB — URINALYSIS, ROUTINE W REFLEX MICROSCOPIC
Bilirubin Urine: NEGATIVE
Glucose, UA: NEGATIVE
Hgb urine dipstick: NEGATIVE
Ketones, ur: NEGATIVE
Leukocytes,Ua: NEGATIVE
Nitrite: NEGATIVE
Protein, ur: NEGATIVE
Specific Gravity, Urine: 1.016 (ref 1.001–1.03)
pH: <=5 (ref 5.0–8.0)

## 2021-03-15 LAB — COMPREHENSIVE METABOLIC PANEL
ALT: 20 U/L (ref 0–53)
AST: 21 U/L (ref 0–37)
Albumin: 3.8 g/dL (ref 3.5–5.2)
Alkaline Phosphatase: 69 U/L (ref 39–117)
BUN: 15 mg/dL (ref 6–23)
CO2: 25 mEq/L (ref 19–32)
Calcium: 9 mg/dL (ref 8.4–10.5)
Chloride: 105 mEq/L (ref 96–112)
Creatinine, Ser: 0.88 mg/dL (ref 0.40–1.50)
GFR: 76.05 mL/min (ref >60.00)
Glucose, Bld: 90 mg/dL (ref 70–99)
Potassium: 4.4 mEq/L (ref 3.5–5.1)
Sodium: 140 mEq/L (ref 135–145)
Total Bilirubin: 0.4 mg/dL (ref 0.2–1.2)
Total Protein: 6.2 g/dL (ref 6.0–8.3)

## 2021-03-15 LAB — VITAMIN B12: Vitamin B-12: 333 pg/mL (ref 211–911)

## 2021-03-15 NOTE — Assessment & Plan Note (Addendum)
Most recently occurring 3 weeks ago,  Did not seek medical attention.  He recalls that this was his 4th event in 6 years  Previous one was over a year ago Jan 2020 and he was admitted overnight for cardiac workup which was negative per DC summary .  Last MRI brain was negative for masses and strokes in  2019,  But noted "Several chronic micro hemorrhages in the left hemisphere with bilateral cerebral white matter and deep gray matter signal changes most compatible with chronic small vessel disease."  (done to evaluate for causes of poor balance).  Jan 2020 CT head done during admission for syncope was normal except for chronic small vessel disease .  Because he is now reporting that each episode was accompaneid by low of bowel and/or bladder control,  Non epileptic seizure is a consideration.  Neurology referral advised and in process

## 2021-04-01 ENCOUNTER — Other Ambulatory Visit: Payer: Self-pay | Admitting: Internal Medicine

## 2021-04-12 DIAGNOSIS — G4733 Obstructive sleep apnea (adult) (pediatric): Secondary | ICD-10-CM | POA: Diagnosis not present

## 2021-04-12 DIAGNOSIS — H6123 Impacted cerumen, bilateral: Secondary | ICD-10-CM | POA: Diagnosis not present

## 2021-04-15 ENCOUNTER — Ambulatory Visit: Payer: Self-pay | Admitting: Urology

## 2021-05-06 ENCOUNTER — Other Ambulatory Visit: Payer: Self-pay

## 2021-06-04 ENCOUNTER — Other Ambulatory Visit: Payer: Self-pay | Admitting: Internal Medicine

## 2021-06-16 DIAGNOSIS — D2271 Melanocytic nevi of right lower limb, including hip: Secondary | ICD-10-CM | POA: Diagnosis not present

## 2021-06-16 DIAGNOSIS — L570 Actinic keratosis: Secondary | ICD-10-CM | POA: Diagnosis not present

## 2021-06-16 DIAGNOSIS — Z85828 Personal history of other malignant neoplasm of skin: Secondary | ICD-10-CM | POA: Diagnosis not present

## 2021-06-16 DIAGNOSIS — X32XXXA Exposure to sunlight, initial encounter: Secondary | ICD-10-CM | POA: Diagnosis not present

## 2021-06-16 DIAGNOSIS — C44311 Basal cell carcinoma of skin of nose: Secondary | ICD-10-CM | POA: Diagnosis not present

## 2021-06-16 DIAGNOSIS — L821 Other seborrheic keratosis: Secondary | ICD-10-CM | POA: Diagnosis not present

## 2021-06-16 DIAGNOSIS — D485 Neoplasm of uncertain behavior of skin: Secondary | ICD-10-CM | POA: Diagnosis not present

## 2021-06-16 DIAGNOSIS — D2262 Melanocytic nevi of left upper limb, including shoulder: Secondary | ICD-10-CM | POA: Diagnosis not present

## 2021-06-16 DIAGNOSIS — D2261 Melanocytic nevi of right upper limb, including shoulder: Secondary | ICD-10-CM | POA: Diagnosis not present

## 2021-07-06 ENCOUNTER — Other Ambulatory Visit: Payer: Self-pay | Admitting: Internal Medicine

## 2021-08-22 ENCOUNTER — Other Ambulatory Visit: Payer: Self-pay | Admitting: Internal Medicine

## 2021-08-31 ENCOUNTER — Other Ambulatory Visit: Payer: Self-pay

## 2021-08-31 ENCOUNTER — Encounter: Payer: Self-pay | Admitting: Internal Medicine

## 2021-08-31 ENCOUNTER — Ambulatory Visit (INDEPENDENT_AMBULATORY_CARE_PROVIDER_SITE_OTHER): Payer: Medicare Other | Admitting: Internal Medicine

## 2021-08-31 VITALS — BP 148/76 | HR 72 | Temp 96.1°F | Ht 70.0 in | Wt 198.1 lb

## 2021-08-31 DIAGNOSIS — R55 Syncope and collapse: Secondary | ICD-10-CM | POA: Diagnosis not present

## 2021-08-31 DIAGNOSIS — Z23 Encounter for immunization: Secondary | ICD-10-CM

## 2021-08-31 LAB — CBC WITH DIFFERENTIAL/PLATELET
Basophils Absolute: 0 10*3/uL (ref 0.0–0.1)
Basophils Relative: 0.5 % (ref 0.0–3.0)
Eosinophils Absolute: 0.2 10*3/uL (ref 0.0–0.7)
Eosinophils Relative: 2.1 % (ref 0.0–5.0)
HCT: 45 % (ref 39.0–52.0)
Hemoglobin: 14.7 g/dL (ref 13.0–17.0)
Lymphocytes Relative: 29.1 % (ref 12.0–46.0)
Lymphs Abs: 2.1 10*3/uL (ref 0.7–4.0)
MCHC: 32.8 g/dL (ref 30.0–36.0)
MCV: 97.2 fl (ref 78.0–100.0)
Monocytes Absolute: 0.6 10*3/uL (ref 0.1–1.0)
Monocytes Relative: 8.5 % (ref 3.0–12.0)
Neutro Abs: 4.4 10*3/uL (ref 1.4–7.7)
Neutrophils Relative %: 59.8 % (ref 43.0–77.0)
Platelets: 203 10*3/uL (ref 150.0–400.0)
RBC: 4.63 Mil/uL (ref 4.22–5.81)
RDW: 13.3 % (ref 11.5–15.5)
WBC: 7.3 10*3/uL (ref 4.0–10.5)

## 2021-08-31 LAB — COMPREHENSIVE METABOLIC PANEL
ALT: 17 U/L (ref 0–53)
AST: 18 U/L (ref 0–37)
Albumin: 4 g/dL (ref 3.5–5.2)
Alkaline Phosphatase: 64 U/L (ref 39–117)
BUN: 11 mg/dL (ref 6–23)
CO2: 28 mEq/L (ref 19–32)
Calcium: 9.1 mg/dL (ref 8.4–10.5)
Chloride: 105 mEq/L (ref 96–112)
Creatinine, Ser: 0.78 mg/dL (ref 0.40–1.50)
GFR: 78.62 mL/min (ref >60.00)
Glucose, Bld: 82 mg/dL (ref 70–99)
Potassium: 4.1 mEq/L (ref 3.5–5.1)
Sodium: 142 mEq/L (ref 135–145)
Total Bilirubin: 0.5 mg/dL (ref 0.2–1.2)
Total Protein: 6.4 g/dL (ref 6.0–8.3)

## 2021-08-31 LAB — MAGNESIUM: Magnesium: 1.8 mg/dL (ref 1.5–2.5)

## 2021-08-31 NOTE — Assessment & Plan Note (Signed)
Most recent episode was witnessed by daughter and there was no LOC,  Only presyncopal feelings of light headedness and fecal incontinence.  Suspect orthostatic hypotension resulting from dehydration from working in his wood shop.  Advised daughter on how to check his BP at home and if not orthostatic ,  Will refer to neurology (or earlier if patient desires it )  Checking CBC and lytes/cr
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
## 2021-10-21 ENCOUNTER — Encounter: Payer: Self-pay | Admitting: Internal Medicine

## 2021-10-23 MED ORDER — ESCITALOPRAM OXALATE 10 MG PO TABS: 10.0000 mg | ORAL_TABLET | Freq: Every day | ORAL | 2 refills | Status: DC

## 2021-11-03 DIAGNOSIS — H04123 Dry eye syndrome of bilateral lacrimal glands: Secondary | ICD-10-CM | POA: Diagnosis not present

## 2021-11-07 DIAGNOSIS — C44311 Basal cell carcinoma of skin of nose: Secondary | ICD-10-CM | POA: Diagnosis not present

## 2021-11-15 ENCOUNTER — Other Ambulatory Visit: Payer: Self-pay | Admitting: Internal Medicine

## 2021-11-15 ENCOUNTER — Ambulatory Visit (INDEPENDENT_AMBULATORY_CARE_PROVIDER_SITE_OTHER): Payer: Medicare Other

## 2021-11-15 ENCOUNTER — Ambulatory Visit (INDEPENDENT_AMBULATORY_CARE_PROVIDER_SITE_OTHER): Payer: Medicare Other | Admitting: Vascular Surgery

## 2021-11-15 ENCOUNTER — Other Ambulatory Visit: Payer: Self-pay

## 2021-11-15 ENCOUNTER — Encounter (INDEPENDENT_AMBULATORY_CARE_PROVIDER_SITE_OTHER): Payer: Self-pay | Admitting: Vascular Surgery

## 2021-11-15 VITALS — BP 191/92 | HR 74 | Resp 16 | Wt 187.0 lb

## 2021-11-15 DIAGNOSIS — I6523 Occlusion and stenosis of bilateral carotid arteries: Secondary | ICD-10-CM

## 2021-11-15 DIAGNOSIS — E785 Hyperlipidemia, unspecified: Secondary | ICD-10-CM | POA: Diagnosis not present

## 2021-11-15 DIAGNOSIS — I10 Essential (primary) hypertension: Secondary | ICD-10-CM | POA: Diagnosis not present

## 2021-11-15 DIAGNOSIS — I739 Peripheral vascular disease, unspecified: Secondary | ICD-10-CM

## 2021-11-15 NOTE — Patient Instructions (Signed)
Peripheral Vascular Disease ?Peripheral vascular disease (PVD) is a disease of the blood vessels that carry blood from the heart to the rest of the body. PVD is also called peripheral artery disease (PAD) or poor circulation. PVD affects most of the body. But it affects the legs and feet the most. ?PVD can lead to acute limb ischemia. This happens when there is a sudden stop of blood flow to an arm or leg. This is a medical emergency. ?What are the causes? ?The most common cause of PVD is a buildup of a fatty substance (plaque) inside your arteries. This decreases blood flow. Plaque can break off and block blood in a smaller artery. This can lead to acute limb ischemia. ?Other common causes of PVD include: ?Blood clots inside the blood vessels. ?Injuries to blood vessels. ?Irritation and swelling of blood vessels. ?Sudden tightening of the blood vessel (spasms). ?What increases the risk? ?A family history of PVD. ?Medical conditions, including: ?High cholesterol. ?Diabetes. ?High blood pressure. ?Heart disease. ?Past problems with blood clots. ?Past injury, such as burns or a broken bone. ?Other conditions, such as: ?Buerger's disease. This is caused by swollen or irritated blood vessels in your hands and feet. ?Arthritis. ?Birth defects that affect the arteries in your legs. ?Kidney disease. ?Using tobacco or nicotine products. ?Not getting enough exercise. ?Being very overweight (obese). ?Being 50 years old or older. ?What are the signs or symptoms? ?Cramps in your butt, legs, and feet. ?Pain and weakness in your legs when you are active that goes away when you rest. ?Leg pain when at rest. ?Leg numbness, tingling, or weakness. ?Coldness in a leg or foot, especially when compared with the other leg or foot. ?Skin or hair changes. These can include: ?Hair loss. ?Shiny skin. ?Pale or bluish skin. ?Thick toenails. ?Being unable to get or keep an erection. ?Tiredness (fatigue). ?Weak pulse or no pulse in the  feet. ?Wounds and sores on the toes, feet, or legs. These take longer to heal. ?How is this treated? ?Underlying causes are treated first. Other conditions, like diabetes, high cholesterol, and blood pressure, are also treated. Treatment may include: ?Lifestyle changes, such as: ?Quitting smoking. ?Getting regular exercise. ?Having a diet low in fat and cholesterol. ?Not drinking alcohol. ?Taking medicines, such as: ?Blood thinners. ?Medicines to improve blood flow. ?Medicines to improve your blood cholesterol. ?Procedures to: ?Open the arteries and restore blood flow. ?Insert a small mesh tube (stent) to keep a blocked vessel open. ?Create a new path for blood to flow to the body (peripheral bypass). ?Remove dead tissue from a wound. ?Remove an affected leg or arm. ?Follow these instructions at home: ?Medicines ?Take over-the-counter and prescription medicines only as told by your doctor. ?If you are taking blood thinners: ?Talk with your doctor before you take any medicines that have aspirin, or NSAIDs, such as ibuprofen. ?Take medicines exactly as told. Take them at the same time each day. ?Avoid doing things that could hurt or bruise you. Take action to prevent falls. ?Wear an alert bracelet or carry a card that shows you are taking blood thinners. ?Lifestyle ?  ?Get regular exercise. Ask your doctor about how to stay active. ?Talk with your doctor about keeping a healthy weight. If needed, ask about losing weight. ?Eat a diet that is low in fat and cholesterol. If you need help, talk with your doctor. ?Do not drink alcohol. ?Do not smoke or use any products that contain nicotine or tobacco. If you need help quitting, ask your   doctor. ?General instructions ?Take good care of your feet. To do this: ?Wear shoes that fit well and feel good. ?Check your feet often for any cuts or sores. ?Get a flu shot (influenza vaccine) each year. ?Keep all follow-up visits. ?Where to find more information ?Society for Vascular  Surgery: vascular.org ?American Heart Association: heart.org ?National Heart, Lung, and Blood Institute: nhlbi.nih.gov ?Contact a doctor if: ?You have cramps in your legs when you walk. ?You have leg pain when you rest. ?Your leg or foot feels cold. ?Your skin changes. ?You cannot get or keep an erection. ?You have cuts or sores on your legs or feet that do not heal. ?Get help right away if: ?You have sudden changes in the color and feeling of your arms or legs, such as: ?Your arm or leg turns cold, numb, and blue. ?Your arm or leg becomes red, warm, swollen, painful, or numb. ?You have any signs of a stroke. "BE FAST" is an easy way to remember the main warning signs: ?B - Balance. Dizziness, sudden trouble walking, or loss of balance. ?E - Eyes. Trouble seeing or a change in how you see. ?F - Face. Sudden weakness or loss of feeling of the face. The face or eyelid may droop on one side. ?A - Arms. Weakness or loss of feeling in an arm. This happens all of a sudden and most often on one side of the body. ?S - Speech. Sudden trouble speaking, slurred speech, or trouble understanding what people say. ?T - Time. Time to call emergency services. Write down what time symptoms started. ?You have other signs of a stroke, such as: ?A sudden, very bad headache with no known cause. ?Feeling like you may vomit (nausea). ?Vomiting. ?A seizure. ?You have chest pain or trouble breathing. ?These symptoms may be an emergency. Get help right away. Call your local emergency services (911 in the [place] ?Do not wait to see if the symptoms will go away. ?Do not drive yourself to the hospital. ?Summary ?Peripheral vascular disease (PVD) is a disease of the blood vessels. ?PVD affects the legs and feet the most. ?Symptoms may include leg pain or leg numbness, tingling, and weakness. ?Treatment may include lifestyle changes, medicines, and procedures. ?This information is not intended to replace advice given to you by your health care  provider. Make sure you discuss any questions you have with your health care provider. ?Document Revised: 05/24/2020 Document Reviewed: 05/24/2020 ?Elsevier Patient Education ? 2022 [employer] Inc. ? ?

## 2021-11-15 NOTE — Assessment & Plan Note (Signed)
His ABIs today remain noncompressible due to significant medial calcification but his waveforms are good and his digital pressures are normal so his flow is maintained.  No limb threatening symptoms.  Recheck in 1 year.  Continue current medical regimen

## 2021-11-15 NOTE — Assessment & Plan Note (Signed)
Right carotid stenosis in the 1 to 39% range and left carotid stenosis just into the 40 to 59% range.  No role for intervention at this level.  Continue current medical regimen.  Recheck in 1 year.

## 2021-11-15 NOTE — Progress Notes (Signed)
MRN : [id number]  David Atkins is a 87 y.o. (08/21/1929) male who presents with chief complaint of  Chief Complaint  Patient presents with   Follow-up    Ultrasound follow up  .  History of Present Illness: Patient returns in follow-up of multiple vascular issues.  He is doing reasonably well today.  He has had issues with blood pressure fluctuations and has had some sort of seizure issues since his last visit.  This is felt to be due to his blood pressure as well.  No focal neurologic symptoms. Specifically, the patient denies amaurosis fugax, speech or swallowing difficulties, or arm or leg weakness or numbness. Right carotid stenosis in the 1 to 39% range and left carotid stenosis just into the 40 to 59% range. He is also followed for peripheral arterial disease.  He walks reasonably well with a walker and has not had any ulceration, rest pain, or gangrenous changes to the lower extremities.  His ABIs today remain noncompressible due to significant medial calcification but his waveforms are good and his digital pressures are normal so his flow is maintained.  Current Outpatient Medications  Medication Sig Dispense Refill   acetaminophen (TYLENOL) 500 MG tablet Take 500 mg by mouth at bedtime.      amLODipine (NORVASC) 2.5 MG tablet TAKE 1 TABLET BY MOUTH  DAILY 90 tablet 3   aspirin 81 MG EC tablet Take 81 mg by mouth daily.      Azelastine-Fluticasone 137-50 MCG/ACT SUSP Place 1 spray into both nostrils 2 (two) times daily as needed. 23 g 5   Blood Pressure Monitoring (BLOOD PRESSURE MONITOR AUTOMAT) DEVI Use daily to check blood pressure 1 Device 0   cholecalciferol (VITAMIN D) 25 MCG (1000 UNIT) tablet      cyanocobalamin (,VITAMIN B-12,) 1000 MCG/ML injection INJECT 1 ML (1,000 MCG TOTAL) INTO THE MUSCLE ONCE A WEEK. FOR 4 WEEKS, THEN MONTHLY THEREAFTER 12 mL 1   diclofenac Sodium (VOLTAREN) 1 % GEL APPLY 4 GRAMS TOPICALLY 4  TIMES DAILY 400 g 0   escitalopram (LEXAPRO) 10 MG tablet  Take 1 tablet (10 mg total) by mouth daily. 30 tablet 2   finasteride (PROSCAR) 5 MG tablet TAKE 1 TABLET BY MOUTH  DAILY 90 tablet 3   lisinopril (ZESTRIL) 20 MG tablet TAKE 1 TABLET BY MOUTH  DAILY 90 tablet 3   metoprolol tartrate (LOPRESSOR) 25 MG tablet TAKE ONE-HALF TABLET BY  MOUTH TWICE DAILY 90 tablet 3   nitroGLYCERIN (NITROSTAT) 0.4 MG SL tablet Place 1 tablet (0.4 mg total) under the tongue every 5 (five) minutes as needed for chest pain. Maximum of 3 doses. 25 tablet 3   omeprazole (PRILOSEC) 20 MG capsule TAKE 2 CAPSULES BY MOUTH  DAILY 180 capsule 3   oxybutynin (DITROPAN) 5 MG tablet TAKE 1 TABLET BY MOUTH  TWICE DAILY 180 tablet 3   polyethylene glycol powder (GLYCOLAX/MIRALAX) powder Take 17 g by mouth once. Dissolve 1 capful of power into any liquid and drink once daily (Patient taking differently: Take 17 g by mouth at bedtime. Dissolve 1 capful of power into any liquid and drink once daily) 500 g 0   rOPINIRole (REQUIP) 0.25 MG tablet TAKE 1 TABLET BY MOUTH AT  BEDTIME. 90 tablet 3   Simethicone Extra Strength 125 MG CAPS Take 125 mg by mouth as needed (gas).     simvastatin (ZOCOR) 20 MG tablet TAKE 1 TABLET BY MOUTH AT  BEDTIME 90 tablet 3   Syringe/Needle,  Disp, (SYRINGE 3CC/25GX1") 25G X 1" 3 ML MISC Use for b12 injections 50 each 0   tamsulosin (FLOMAX) 0.4 MG CAPS capsule TAKE ONE CAPSULE BY MOUTH  EVERY DAY AFTER BREAKFAST 90 capsule 3   erythromycin ophthalmic ointment SMARTSIG:0.25 Sparingly In Eye(s) Every Night     No current facility-administered medications for this visit.    Past Medical History:  Diagnosis Date   3-vessel coronary artery disease 2006   [place]   basal cell    Basal cell carcinoma 07/09/2012   BPH (benign prostatic hyperplasia)    GERD (gastroesophageal reflux disease)    Hyperlipidemia    Hypertension    Lumbar stenosis 12/03/13   MRI - lumbar pain with radiation down to LE   Myocardial infarction [facility]) 2006   OAB (overactive bladder)     Osteoporosis 2010   by DEXA at [facility]   Sleep apnea    Spondylolisthesis of lumbar region 11/23/13   MRI Lumbar spine    Past Surgical History:  Procedure Laterality Date   APPENDECTOMY  1951   COLONOSCOPY     COLONOSCOPY WITH PROPOFOL N/A 11/15/2015   Procedure: COLONOSCOPY WITH PROPOFOL;  Surgeon: Hulen Luster, MD;  Location: [facility] ENDOSCOPY;  Service: Gastroenterology;  Laterality: N/A;   CORONARY ARTERY BYPASS GRAFT  2006   3 vessel, s/p AMI   NASAL SEPTOPLASTY W/ TURBINOPLASTY  1981   TONSILLECTOMY       Social History   Tobacco Use   Smoking status: Former    Types: Cigarettes    Quit date: 07/10/1963    Years since quitting: 58.3   Smokeless tobacco: Never  Substance Use Topics   Alcohol use: No   Drug use: No      Family History  Problem Relation Age of Onset   Hyperlipidemia Mother    Hypertension Mother    Hypertension Father      Allergies  Allergen Reactions   Sulfa Antibiotics Rash    REVIEW OF SYSTEMS (Negative unless checked)   Constitutional: [] Weight loss  [] Fever  [] Chills Cardiac: [] Chest pain   [] Chest pressure   [] Palpitations   [] Shortness of breath when laying flat   [] Shortness of breath at rest   [] Shortness of breath with exertion. Vascular:  [x] Pain in legs with walking   [] Pain in legs at rest   [] Pain in legs when laying flat   [] Claudication   [] Pain in feet when walking  [] Pain in feet at rest  [] Pain in feet when laying flat   [] History of DVT   [] Phlebitis   [] Swelling in legs   [] Varicose veins   [] Non-healing ulcers Pulmonary:   [] Uses home oxygen   [] Productive cough   [] Hemoptysis   [] Wheeze  [] COPD   [] Asthma Neurologic:  [x] Dizziness  [] Blackouts   [] Seizures   [] History of stroke   [] History of TIA  [] Aphasia   [] Temporary blindness   [] Dysphagia   [] Weakness or numbness in arms   [] Weakness or numbness in legs Musculoskeletal:  [x] Arthritis   [] Joint swelling   [] Joint pain   [] Low back pain Hematologic:  [] Easy bruising   [] Easy bleeding   [] Hypercoagulable state   [] Anemic   Gastrointestinal:  [] Blood in stool   [] Vomiting blood  [] Gastroesophageal reflux/heartburn   [] Abdominal pain Genitourinary:  [] Chronic kidney disease   [] Difficult urination  [] Frequent urination  [] Burning with urination   [] Hematuria Skin:  [] Rashes   [] Ulcers   [] Wounds Psychological:  [] History of anxiety   []  History  of major depression.  Physical Examination  Vitals:   11/15/21 1450 11/15/21 1451  BP: (!) 192/92 (!) 191/92  Pulse: 74   Resp: 16   Weight: 187 lb (84.8 kg)    Body mass index is 26.83 kg/m. Gen:  WD/WN, NAD Head: [place]/AT, No temporalis wasting. Ear/Nose/Throat: Hearing grossly intact, nares w/o erythema or drainage, trachea midline Eyes: Conjunctiva clear. Sclera non-icteric Neck: Supple.  No bruit  Pulmonary:  Good air movement, equal and clear to auscultation bilaterally.  Cardiac: RRR, No JVD Vascular:  Vessel Right Left  Radial Palpable Palpable                          PT Palpable Palpable  DP Palpable Palpable    Musculoskeletal: M/S 5/5 throughout.  No deformity or atrophy.  Walks with a walker.  Mild bilateral lower extremity edema. Neurologic: CN 2-12 intact. Sensation grossly intact in extremities.  Symmetrical.  Speech is fluent. Motor exam as listed above. Psychiatric: Judgment intact, Mood & affect appropriate for pt's clinical situation. Dermatologic: No rashes or ulcers noted.  No cellulitis or open wounds.     CBC Lab Results  Component Value Date   WBC 7.3 08/31/2021   HGB 14.7 08/31/2021   HCT 45.0 08/31/2021   MCV 97.2 08/31/2021   PLT 203.0 08/31/2021    BMET    Component Value Date/Time   NA 142 08/31/2021 1426   NA 143 01/06/2014 1655   K 4.1 08/31/2021 1426   K 3.6 01/06/2014 1655   CL 105 08/31/2021 1426   CL 109 (H) 01/06/2014 1655   CO2 28 08/31/2021 1426   CO2 27 01/06/2014 1655   GLUCOSE 82 08/31/2021 1426   GLUCOSE 88 01/06/2014 1655   BUN 11  08/31/2021 1426   BUN 16 01/06/2014 1655   CREATININE 0.78 08/31/2021 1426   CREATININE 0.93 08/27/2020 1457   CALCIUM 9.1 08/31/2021 1426   CALCIUM 9.0 01/06/2014 1655   GFRNONAA >60 12/15/2018 1813   GFRNONAA >60 01/06/2014 1655   GFRAA >60 12/15/2018 1813   GFRAA >60 01/06/2014 1655   CrCl cannot be calculated (Patient's most recent lab result is older than the maximum 21 days allowed.).  COAG No results found for: INR, PROTIME  Radiology No results found.   Assessment/Plan Hypertension blood pressure control important in reducing the progression of atherosclerotic disease. On appropriate oral medications.     Hyperlipidemia LDL goal <70 lipid control important in reducing the progression of atherosclerotic disease. Continue statin therapy  Bilateral carotid artery stenosis Right carotid stenosis in the 1 to 39% range and left carotid stenosis just into the 40 to 59% range.  No role for intervention at this level.  Continue current medical regimen.  Recheck in 1 year.  PAD (peripheral artery disease) (HCC) His ABIs today remain noncompressible due to significant medial calcification but his waveforms are good and his digital pressures are normal so his flow is maintained.  No limb threatening symptoms.  Recheck in 1 year.  Continue current medical regimen    Leotis Pain, MD  11/15/2021 3:09 PM    This note was created with Dragon medical transcription system.  Any errors from dictation are purely unintentional

## 2021-11-16 ENCOUNTER — Other Ambulatory Visit: Payer: Self-pay | Admitting: Internal Medicine

## 2021-11-18 ENCOUNTER — Ambulatory Visit: Payer: Medicare Other

## 2021-11-18 DIAGNOSIS — H6123 Impacted cerumen, bilateral: Secondary | ICD-10-CM | POA: Diagnosis not present

## 2021-11-18 DIAGNOSIS — G4733 Obstructive sleep apnea (adult) (pediatric): Secondary | ICD-10-CM | POA: Diagnosis not present

## 2021-11-18 DIAGNOSIS — H903 Sensorineural hearing loss, bilateral: Secondary | ICD-10-CM | POA: Diagnosis not present

## 2021-11-29 DIAGNOSIS — G4733 Obstructive sleep apnea (adult) (pediatric): Secondary | ICD-10-CM | POA: Diagnosis not present

## 2021-11-29 DIAGNOSIS — I10 Essential (primary) hypertension: Secondary | ICD-10-CM | POA: Diagnosis not present

## 2021-12-20 DIAGNOSIS — D225 Melanocytic nevi of trunk: Secondary | ICD-10-CM | POA: Diagnosis not present

## 2021-12-20 DIAGNOSIS — D2271 Melanocytic nevi of right lower limb, including hip: Secondary | ICD-10-CM | POA: Diagnosis not present

## 2021-12-20 DIAGNOSIS — X32XXXA Exposure to sunlight, initial encounter: Secondary | ICD-10-CM | POA: Diagnosis not present

## 2021-12-20 DIAGNOSIS — D2261 Melanocytic nevi of right upper limb, including shoulder: Secondary | ICD-10-CM | POA: Diagnosis not present

## 2021-12-20 DIAGNOSIS — D2272 Melanocytic nevi of left lower limb, including hip: Secondary | ICD-10-CM | POA: Diagnosis not present

## 2021-12-20 DIAGNOSIS — D2262 Melanocytic nevi of left upper limb, including shoulder: Secondary | ICD-10-CM | POA: Diagnosis not present

## 2021-12-20 DIAGNOSIS — L821 Other seborrheic keratosis: Secondary | ICD-10-CM | POA: Diagnosis not present

## 2021-12-20 DIAGNOSIS — L570 Actinic keratosis: Secondary | ICD-10-CM | POA: Diagnosis not present

## 2021-12-22 DIAGNOSIS — Z85828 Personal history of other malignant neoplasm of skin: Secondary | ICD-10-CM | POA: Diagnosis not present

## 2021-12-29 ENCOUNTER — Other Ambulatory Visit: Payer: Self-pay | Admitting: Internal Medicine

## 2021-12-30 DIAGNOSIS — I10 Essential (primary) hypertension: Secondary | ICD-10-CM | POA: Diagnosis not present

## 2021-12-30 DIAGNOSIS — G4733 Obstructive sleep apnea (adult) (pediatric): Secondary | ICD-10-CM | POA: Diagnosis not present

## 2021-12-30 NOTE — Telephone Encounter (Signed)
Last apt 08/31/21 Next apt 2/2 w/ health advisor Sent in refill.

## 2022-01-05 ENCOUNTER — Ambulatory Visit (INDEPENDENT_AMBULATORY_CARE_PROVIDER_SITE_OTHER): Payer: Medicare Other

## 2022-01-05 VITALS — BP 132/85 | Ht 70.0 in | Wt 187.0 lb

## 2022-01-05 DIAGNOSIS — Z0000 Encounter for general adult medical examination without abnormal findings: Secondary | ICD-10-CM

## 2022-01-05 NOTE — Patient Instructions (Addendum)
David Atkins , Thank you for taking time to come for your Medicare Wellness Visit. I appreciate your ongoing commitment to your health goals. Please review the following plan we discussed and let me know if I can assist you in the future.   These are the goals we discussed:  Goals      Follow up with Primary Care Provider     As needed Continue to increase physical activity: chair exercises, walking        This is a list of the screening recommended for you and due dates:  Health Maintenance  Topic Date Due   COVID-19 Vaccine (5 - Booster for Pfizer series) 01/21/2022*   Zoster (Shingles) Vaccine (2 of 2) 04/04/2022*   Tetanus Vaccine  08/16/2027   Pneumonia Vaccine  Completed   Flu Shot  Completed   HPV Vaccine  Aged Out  *Topic was postponed. The date shown is not the original due date.   Advanced directives: on file  Conditions/risks identified: none new  Follow up in one year for your annual wellness visit.   Preventive Care 47 Years and Older, Male Preventive care refers to lifestyle choices and visits with your health care provider that can promote health and wellness. What does preventive care include? A yearly physical exam. This is also called an annual well check. Dental exams once or twice a year. Routine eye exams. Ask your health care provider how often you should have your eyes checked. Personal lifestyle choices, including: Daily care of your teeth and gums. Regular physical activity. Eating a healthy diet. Avoiding tobacco and drug use. Limiting alcohol use. Practicing safe sex. Taking low doses of aspirin every day. Taking vitamin and mineral supplements as recommended by your health care provider. What happens during an annual well check? The services and screenings done by your health care provider during your annual well check will depend on your age, overall health, lifestyle risk factors, and family history of disease. Counseling  Your health  care provider may ask you questions about your: Alcohol use. Tobacco use. Drug use. Emotional well-being. Home and relationship well-being. Sexual activity. Eating habits. History of falls. Memory and ability to understand (cognition). Work and work Statistician. Screening  You may have the following tests or measurements: Height, weight, and BMI. Blood pressure. Lipid and cholesterol levels. These may be checked every 5 years, or more frequently if you are over 44 years old. Skin check. Lung cancer screening. You may have this screening every year starting at age 50 if you have a 30-pack-year history of smoking and currently smoke or have quit within the past 15 years. Fecal occult blood test (FOBT) of the stool. You may have this test every year starting at age 3. Flexible sigmoidoscopy or colonoscopy. You may have a sigmoidoscopy every 5 years or a colonoscopy every 10 years starting at age 5. Prostate cancer screening. Recommendations will vary depending on your family history and other risks. Hepatitis C blood test. Hepatitis B blood test. Sexually transmitted disease (STD) testing. Diabetes screening. This is done by checking your blood sugar (glucose) after you have not eaten for a while (fasting). You may have this done every 1-3 years. Abdominal aortic aneurysm (AAA) screening. You may need this if you are a current or former smoker. Osteoporosis. You may be screened starting at age 54 if you are at high risk. Talk with your health care provider about your test results, treatment options, and if necessary, the need for more tests.  Vaccines  Your health care provider may recommend certain vaccines, such as: Influenza vaccine. This is recommended every year. Tetanus, diphtheria, and acellular pertussis (Tdap, Td) vaccine. You may need a Td booster every 10 years. Zoster vaccine. You may need this after age 74. Pneumococcal 13-valent conjugate (PCV13) vaccine. One dose is  recommended after age 47. Pneumococcal polysaccharide (PPSV23) vaccine. One dose is recommended after age 61. Talk to your health care provider about which screenings and vaccines you need and how often you need them. This information is not intended to replace advice given to you by your health care provider. Make sure you discuss any questions you have with your health care provider. Document Released: 12/17/2015 Document Revised: 08/09/2016 Document Reviewed: 09/21/2015 Elsevier Interactive Patient Education  2017 [place] Prevention in the Home Falls can cause injuries. They can happen to people of all ages. There are many things you can do to make your home safe and to help prevent falls. What can I do on the outside of my home? Regularly fix the edges of walkways and driveways and fix any cracks. Remove anything that might make you trip as you walk through a door, such as a raised step or threshold. Trim any bushes or trees on the path to your home. Use bright outdoor lighting. Clear any walking paths of anything that might make someone trip, such as rocks or tools. Regularly check to see if handrails are loose or broken. Make sure that both sides of any steps have handrails. Any raised decks and porches should have guardrails on the edges. Have any leaves, snow, or ice cleared regularly. Use sand or salt on walking paths during winter. Clean up any spills in your garage right away. This includes oil or grease spills. What can I do in the bathroom? Use night lights. Install grab bars by the toilet and in the tub and shower. Do not use towel bars as grab bars. Use non-skid mats or decals in the tub or shower. If you need to sit down in the shower, use a plastic, non-slip stool. Keep the floor dry. Clean up any water that spills on the floor as soon as it happens. Remove soap buildup in the tub or shower regularly. Attach bath mats securely with double-sided non-slip rug  tape. Do not have throw rugs and other things on the floor that can make you trip. What can I do in the bedroom? Use night lights. Make sure that you have a light by your bed that is easy to reach. Do not use any sheets or blankets that are too big for your bed. They should not hang down onto the floor. Have a firm chair that has side arms. You can use this for support while you get dressed. Do not have throw rugs and other things on the floor that can make you trip. What can I do in the kitchen? Clean up any spills right away. Avoid walking on wet floors. Keep items that you use a lot in easy-to-reach places. If you need to reach something above you, use a strong step stool that has a grab bar. Keep electrical cords out of the way. Do not use floor polish or wax that makes floors slippery. If you must use wax, use non-skid floor wax. Do not have throw rugs and other things on the floor that can make you trip. What can I do with my stairs? Do not leave any items on the stairs. Make sure that  there are handrails on both sides of the stairs and use them. Fix handrails that are broken or loose. Make sure that handrails are as long as the stairways. Check any carpeting to make sure that it is firmly attached to the stairs. Fix any carpet that is loose or worn. Avoid having throw rugs at the top or bottom of the stairs. If you do have throw rugs, attach them to the floor with carpet tape. Make sure that you have a light switch at the top of the stairs and the bottom of the stairs. If you do not have them, ask someone to add them for you. What else can I do to help prevent falls? Wear shoes that: Do not have high heels. Have rubber bottoms. Are comfortable and fit you well. Are closed at the toe. Do not wear sandals. If you use a stepladder: Make sure that it is fully opened. Do not climb a closed stepladder. Make sure that both sides of the stepladder are locked into place. Ask someone to  hold it for you, if possible. Clearly mark and make sure that you can see: Any grab bars or handrails. First and last steps. Where the edge of each step is. Use tools that help you move around (mobility aids) if they are needed. These include: Canes. Walkers. Scooters. Crutches. Turn on the lights when you go into a dark area. Replace any light bulbs as soon as they burn out. Set up your furniture so you have a clear path. Avoid moving your furniture around. If any of your floors are uneven, fix them. If there are any pets around you, be aware of where they are. Review your medicines with your doctor. Some medicines can make you feel dizzy. This can increase your chance of falling. Ask your doctor what other things that you can do to help prevent falls. This information is not intended to replace advice given to you by your health care provider. Make sure you discuss any questions you have with your health care provider. Document Released: 09/16/2009 Document Revised: 04/27/2016 Document Reviewed: 12/25/2014 Elsevier Interactive Patient Education  2017 [name].

## 2022-01-05 NOTE — Progress Notes (Signed)
Subjective:   David Atkins is a 88 y.o. male who presents for Medicare Annual/Subsequent preventive examination.  Review of Systems    No ROS.  Medicare Wellness Virtual Visit.  Visual/audio telehealth visit, UTA vital signs.   See social history for additional risk factors.   Cardiac Risk Factors include: male gender;hypertension;advanced age (>35men, >85 women)     Objective:    Today's Vitals   01/05/22 1539  BP: 132/85  Weight: 187 lb (84.8 kg)  Height: 5\' 10"  (1.778 m)   Body mass index is 26.83 kg/m.  Advanced Directives 01/05/2022 11/17/2020 11/14/2019 12/16/2018 12/15/2018 11/11/2018 11/06/2017  Does Patient Have a Medical Advance Directive? Yes Yes Yes Yes Yes Yes Yes  Type of Paramedic of [place];Living will Healthcare Power of [place];Living will Living will Living will [place];Living will Living will;Healthcare Power of Attorney  Does patient want to make changes to medical advance directive? No - Patient declined No - Patient declined No - Patient declined No - Patient declined - No - Patient declined No - Patient declined  Copy of Icard in Chart? Yes - validated most recent copy scanned in chart (See row information) Yes - validated most recent copy scanned in chart (See row information) Yes - validated most recent copy scanned in chart (See row information) - - Yes - validated most recent copy scanned in chart (See row information) No - copy requested    Current Medications (verified) Outpatient Encounter Medications as of 01/05/2022  Medication Sig   acetaminophen (TYLENOL) 500 MG tablet Take 500 mg by mouth at bedtime.    amLODipine (NORVASC) 2.5 MG tablet TAKE 1 TABLET BY MOUTH  DAILY   aspirin 81 MG EC tablet Take 81 mg by mouth daily.    Azelastine-Fluticasone 137-50 MCG/ACT SUSP Place 1 spray into both nostrils 2 (two) times daily as needed.   Blood Pressure  Monitoring (BLOOD PRESSURE MONITOR AUTOMAT) DEVI Use daily to check blood pressure   cholecalciferol (VITAMIN D) 25 MCG (1000 UNIT) tablet    cyanocobalamin (,VITAMIN B-12,) 1000 MCG/ML injection INJECT 1 ML (1,000 MCG TOTAL) INTO THE MUSCLE ONCE A WEEK. FOR 4 WEEKS, THEN MONTHLY THEREAFTER   diclofenac Sodium (VOLTAREN) 1 % GEL APPLY 4 GRAMS TOPICALLY 4  TIMES DAILY   erythromycin ophthalmic ointment SMARTSIG:0.25 Sparingly In Eye(s) Every Night   finasteride (PROSCAR) 5 MG tablet TAKE 1 TABLET BY MOUTH  DAILY   lisinopril (ZESTRIL) 20 MG tablet TAKE 1 TABLET BY MOUTH  DAILY   metoprolol tartrate (LOPRESSOR) 25 MG tablet TAKE ONE-HALF TABLET BY  MOUTH TWICE DAILY   nitroGLYCERIN (NITROSTAT) 0.4 MG SL tablet Place 1 tablet (0.4 mg total) under the tongue every 5 (five) minutes as needed for chest pain. Maximum of 3 doses.   omeprazole (PRILOSEC) 20 MG capsule TAKE 2 CAPSULES BY MOUTH  DAILY   oxybutynin (DITROPAN) 5 MG tablet TAKE 1 TABLET BY MOUTH  TWICE DAILY   polyethylene glycol powder (GLYCOLAX/MIRALAX) powder Take 17 g by mouth once. Dissolve 1 capful of power into any liquid and drink once daily (Patient taking differently: Take 17 g by mouth at bedtime. Dissolve 1 capful of power into any liquid and drink once daily)   rOPINIRole (REQUIP) 0.25 MG tablet TAKE 1 TABLET BY MOUTH AT  BEDTIME.   Simethicone Extra Strength 125 MG CAPS Take 125 mg by mouth as needed (gas).   simvastatin (ZOCOR) 20 MG tablet TAKE  1 TABLET BY MOUTH AT  BEDTIME   Syringe/Needle, Disp, (SYRINGE 3CC/25GX1") 25G X 1" 3 ML MISC Use for b12 injections   tamsulosin (FLOMAX) 0.4 MG CAPS capsule TAKE ONE CAPSULE BY MOUTH  EVERY DAY AFTER BREAKFAST   escitalopram (LEXAPRO) 10 MG tablet TAKE 1 TABLET BY MOUTH EVERY DAY (Patient not taking: Reported on 01/05/2022)   No facility-administered encounter medications on file as of 01/05/2022.    Allergies (verified) Sulfa antibiotics   History: Past Medical History:  Diagnosis  Date   3-vessel coronary artery disease 2006   [place]   basal cell    Basal cell carcinoma 07/09/2012   BPH (benign prostatic hyperplasia)    GERD (gastroesophageal reflux disease)    Hyperlipidemia    Hypertension    Lumbar stenosis 12/03/13   MRI - lumbar pain with radiation down to LE   Myocardial infarction [facility]) 2006   OAB (overactive bladder)    Osteoporosis 2010   by DEXA at [facility]   Sleep apnea    Spondylolisthesis of lumbar region 11/23/13   MRI Lumbar spine   Past Surgical History:  Procedure Laterality Date   APPENDECTOMY  1951   COLONOSCOPY     COLONOSCOPY WITH PROPOFOL N/A 11/15/2015   Procedure: COLONOSCOPY WITH PROPOFOL;  Surgeon: Hulen Luster, MD;  Location: [facility] ENDOSCOPY;  Service: Gastroenterology;  Laterality: N/A;   CORONARY ARTERY BYPASS GRAFT  2006   3 vessel, s/p AMI   NASAL SEPTOPLASTY W/ TURBINOPLASTY  1981   TONSILLECTOMY     Family History  Problem Relation Age of Onset   Hyperlipidemia Mother    Hypertension Mother    Hypertension Father    Social History   Socioeconomic History   Marital status: Married    Spouse name: Not on file   Number of children: Not on file   Years of education: Not on file   Highest education level: Not on file  Occupational History   Not on file  Tobacco Use   Smoking status: Former    Types: Cigarettes    Quit date: 07/10/1963    Years since quitting: 58.5   Smokeless tobacco: Never  Substance and Sexual Activity   Alcohol use: No   Drug use: No   Sexual activity: Not Currently  Other Topics Concern   Not on file  Social History Narrative   Not on file   Social Determinants of Health   Financial Resource Strain: Low Risk    Difficulty of Paying Living Expenses: Not hard at all  Food Insecurity: No Food Insecurity   Worried About Charity fundraiser in the Last Year: Never true   [place] in the Last Year: Never true  Transportation Needs: No Transportation Needs   Lack of Transportation  (Medical): No   Lack of Transportation (Non-Medical): No  Physical Activity: Not on file  Stress: No Stress Concern Present   Feeling of Stress : Not at all  Social Connections: Unknown   Frequency of Communication with Friends and Family: Not on file   Frequency of Social Gatherings with Friends and Family: Not on file   Attends Religious Services: Not on file   Active Member of Clubs or Organizations: Not on file   Attends Archivist Meetings: Not on file   Marital Status: Married    Tobacco Counseling Counseling given: Not Answered   Clinical Intake:  Pre-visit preparation completed: Yes        Diabetes: No  How often do you need to have someone help you when you read instructions, pamphlets, or other written materials from your doctor or pharmacy?: 1 - Never    Interpreter Needed?: No      Activities of Daily Living In your present state of health, do you have any difficulty performing the following activities: 01/05/2022  Hearing? Y  Comment Hearing aids  Vision? N  Difficulty concentrating or making decisions? N  Walking or climbing stairs? Y  Comment Walker in use  Dressing or bathing? N  Doing errands, shopping? N  Comment Drives only short distances.  Preparing Food and eating ? N  Comment Meals on wheels assist. Self feeds.  Using the Toilet? N  In the past six months, have you accidently leaked urine? Y  Comment Plans to manage with pad at bedtime.  Do you have problems with loss of bowel control? N  Managing your Medications? N  Managing your Finances? N  Housekeeping or managing your Housekeeping? N  Comment Maid assist  Some recent data might be hidden    Patient Care Team: [name], MD as PCP - General (Internal Medicine)  Indicate any recent Medical Services you may have received from other than [facility] providers in the past year (date may be approximate).     Assessment:   This is a routine wellness examination for  Bry[place].  Virtual Visit via Telephone Note  I connected with  Ferry Matthis on 01/05/22 at  3:30 PM EST by telephone and verified that I am speaking with the correct person using two identifiers.  Persons participating in the virtual visit: patient/Nurse Health Advisor   I discussed the limitations, risks, security and privacy concerns of performing an evaluation and management service by telephone and the availability of in person appointments. The patient expressed understanding and agreed to proceed.  Interactive audio and video telecommunications were attempted between this nurse and patient, however failed, due to patient having technical difficulties OR patient did not have access to video capability.  We continued and completed visit with audio only.  Some vital signs may be absent or patient reported.   Hearing/Vision screen Hearing Screening - Comments:: Hearing aids Vision Screening - Comments:: Followed by [facility])  Wears corrective lenses Cataract extraction, bilateral They have regular follow up with the ophthalmologist  Dietary issues and exercise activities discussed: Current Exercise Habits: Home exercise routine, Type of exercise: walking, Intensity: Mild Healthy diet Good water intake   Goals Addressed             This Visit's Progress    Follow up with Primary Care Provider       As needed Continue to increase physical activity: chair exercises, walking       Depression Screen PHQ 2/9 Scores 01/05/2022 08/31/2021 03/14/2021 11/17/2020 11/14/2019 08/04/2019 11/11/2018  PHQ - 2 Score 0 0 0 0 0 0 0  PHQ- 9 Score - - 1 - - 0 -    Fall Risk Fall Risk  01/05/2022 08/31/2021 03/14/2021 02/11/2021 11/17/2020  Falls in the past year? 0 0 1 0 0  Number falls in past yr: 0 - 0 0 0  Injury with Fall? - - 0 0 0  Risk Factor Category  - - - - -  Comment - - - - -  Risk for fall due to : - - - Impaired balance/gait -  Follow up Falls evaluation completed  Falls evaluation completed Falls evaluation completed Falls evaluation completed  Falls evaluation completed    FALL RISK PREVENTION PERTAINING TO THE HOME:  Home free of loose throw rugs in walkways, pet beds, electrical cords, etc? Yes  Adequate lighting in your home to reduce risk of falls? Yes   ASSISTIVE DEVICES UTILIZED TO PREVENT FALLS: Use of a cane, walker or w/c? Yes  Grab bars in the bathroom? Yes   TIMED UP AND GO: Was the test performed? No .   Cognitive Function:     6CIT Screen 11/17/2020 11/14/2019 11/11/2018 11/06/2017 11/06/2016  What Year? 0 points 0 points 0 points 0 points 0 points  What month? 0 points 0 points 0 points 0 points 0 points  What time? 0 points 0 points 0 points 0 points 0 points  Count back from 20 0 points 0 points 0 points 0 points 0 points  Months in reverse 0 points 0 points 0 points 0 points 0 points  Repeat phrase - 2 points 0 points 0 points 0 points  Total Score - 2 0 0 0    Immunizations Immunization History  Administered Date(s) Administered   Fluad Quad(high Dose 65+) 08/04/2019, 08/31/2021   Influenza Split 11/11/2012, 09/25/2014   Influenza, High Dose Seasonal PF 10/15/2013, 10/01/2015, 08/30/2016, 08/13/2018   Influenza-Unspecified 09/03/2000, 10/04/2001, 10/04/2002, 12/07/2003, 10/04/2004, 09/03/2005, 09/03/2006, 09/04/2007, 08/04/2008, 11/03/2008, 11/09/2010, 12/05/2011, 11/18/2012, 10/04/2013, 11/03/2014, 10/01/2016, 08/04/2017, 08/31/2017, 10/04/2018, 10/04/2020   PFIZER Comirnaty(Gray Top)Covid-19 Tri-Sucrose Vaccine 04/14/2021   PFIZER(Purple Top)SARS-COV-2 Vaccination 12/15/2019, 01/05/2020, 07/18/2020   Pneumococcal Conjugate-13 11/25/2014, 04/03/2015   Pneumococcal Polysaccharide-23 11/04/1999, 12/02/1999, 08/04/2005, 11/25/2012   Td 12/02/1999   Tdap 05/23/2010, 08/15/2017   Zoster Recombinat (Shingrix) 09/11/2019   Zoster, Live 04/10/2011   Shingrix vaccine - one in series completed. Agrees to update immunization  record upon completion.   Screening Tests Health Maintenance  Topic Date Due   COVID-19 Vaccine (5 - Booster for Pfizer series) 01/21/2022 (Originally 06/09/2021)   Zoster Vaccines- Shingrix (2 of 2) 04/04/2022 (Originally 11/06/2019)   TETANUS/TDAP  08/16/2027   Pneumonia Vaccine 44+ Years old  Completed   INFLUENZA VACCINE  Completed   HPV VACCINES  Aged Out   Health Maintenance There are no preventive care reminders to display for this patient.  Lung Cancer Screening: (Low Dose CT Chest recommended if Age 71-80 years, 30 pack-year currently smoking OR have quit w/in 15years.) does not qualify.   Vision Screening: Recommended annual ophthalmology exams for early detection of glaucoma and other disorders of the eye.  Dental Screening: Recommended annual dental exams for proper oral hygiene  Community Resource Referral / Chronic Care Management: CRR required this visit?  No   CCM required this visit?  No      Plan:   Keep all routine maintenance appointments.   I have personally reviewed and noted the following in the patients chart:   Medical and social history Use of alcohol, tobacco or illicit drugs  Current medications and supplements including opioid prescriptions. Patient is not currently taking opioid prescriptions. Functional ability and status Nutritional status Physical activity Advanced directives List of other physicians Hospitalizations, surgeries, and ER visits in previous 12 months Vitals Screenings to include cognitive, depression, and falls Referrals and appointments  In addition, I have reviewed and discussed with patient certain preventive protocols, quality metrics, and best practice recommendations. A written personalized care plan for preventive services as well as general preventive health recommendations were provided to patient via mychart.     Varney Biles, LPN   07/04/4480

## 2022-01-19 ENCOUNTER — Other Ambulatory Visit: Payer: Self-pay

## 2022-01-19 ENCOUNTER — Ambulatory Visit: Payer: Medicare Other | Admitting: Podiatry

## 2022-01-19 DIAGNOSIS — M79674 Pain in right toe(s): Secondary | ICD-10-CM

## 2022-01-19 DIAGNOSIS — I739 Peripheral vascular disease, unspecified: Secondary | ICD-10-CM | POA: Diagnosis not present

## 2022-01-19 DIAGNOSIS — M79675 Pain in left toe(s): Secondary | ICD-10-CM

## 2022-01-19 DIAGNOSIS — B351 Tinea unguium: Secondary | ICD-10-CM | POA: Diagnosis not present

## 2022-01-24 NOTE — Progress Notes (Signed)
°  Subjective:  Patient ID: David Atkins, male    DOB: 1929-06-30,  MRN: [id number]  Chief Complaint  Patient presents with   Nail Problem    Nail trim    88 y.o. male returns for the above complaint.  Patient presents with thickened elongated dystrophic toenails x10.  Mild pain on palpation.  Patient would like to have them debrided down.  He is not able to do it himself.  He denies any other acute complaints.  Objective:  There were no vitals filed for this visit. Podiatric Exam: Vascular: dorsalis pedis and posterior tibial pulses are faintly sluggish palpable bilateral. Capillary return is immediate. Temperature gradient is WNL. Skin turgor WNL  Sensorium: Normal Semmes Weinstein monofilament test. Normal tactile sensation bilaterally. Nail Exam: Pt has thick disfigured discolored nails with subungual debris noted bilateral entire nail hallux through fifth toenails.  Pain on palpation to the nails. Ulcer Exam: There is no evidence of ulcer or pre-ulcerative changes or infection. Orthopedic Exam: Muscle tone and strength are WNL. No limitations in general ROM. No crepitus or effusions noted.  Skin: No Porokeratosis. No infection or ulcers    Assessment & Plan:   1. Pain due to onychomycosis of toenails of both feet   2. PAD (peripheral artery disease) ([place])     Patient was evaluated and treated and all questions answered.  Onychomycosis with pain  -Nails palliatively debrided as below. -Educated on self-care  Procedure: Nail Debridement Rationale: pain  Type of Debridement: manual, sharp debridement. Instrumentation: Nail nipper, rotary burr. Number of Nails: 10 pain onychomycosis feet  Procedures and Treatment: Consent by patient was obtained for treatment procedures. The patient understood the discussion of treatment and procedures well. All questions were answered thoroughly reviewed. Debridement of mycotic and hypertrophic toenails, 1 through 5 bilateral and clearing of  subungual debris. No ulceration, no infection noted.  Return Visit-Office Procedure: Patient instructed to return to the office for a follow up visit 3 months for continued evaluation and treatment.  Boneta Lucks, DPM    No follow-ups on file.

## 2022-01-30 DIAGNOSIS — I10 Essential (primary) hypertension: Secondary | ICD-10-CM | POA: Diagnosis not present

## 2022-01-30 DIAGNOSIS — G4733 Obstructive sleep apnea (adult) (pediatric): Secondary | ICD-10-CM | POA: Diagnosis not present

## 2022-01-30 DIAGNOSIS — R350 Frequency of micturition: Secondary | ICD-10-CM | POA: Diagnosis not present

## 2022-02-27 DIAGNOSIS — G4733 Obstructive sleep apnea (adult) (pediatric): Secondary | ICD-10-CM | POA: Diagnosis not present

## 2022-02-27 DIAGNOSIS — I10 Essential (primary) hypertension: Secondary | ICD-10-CM | POA: Diagnosis not present

## 2022-03-28 ENCOUNTER — Other Ambulatory Visit: Payer: Self-pay | Admitting: Internal Medicine

## 2022-03-30 DIAGNOSIS — I10 Essential (primary) hypertension: Secondary | ICD-10-CM | POA: Diagnosis not present

## 2022-03-30 DIAGNOSIS — G4733 Obstructive sleep apnea (adult) (pediatric): Secondary | ICD-10-CM | POA: Diagnosis not present

## 2022-04-14 DIAGNOSIS — G4733 Obstructive sleep apnea (adult) (pediatric): Secondary | ICD-10-CM | POA: Diagnosis not present

## 2022-04-14 DIAGNOSIS — I10 Essential (primary) hypertension: Secondary | ICD-10-CM | POA: Diagnosis not present

## 2022-04-24 ENCOUNTER — Ambulatory Visit: Payer: Medicare Other | Admitting: Podiatry

## 2022-04-24 ENCOUNTER — Encounter: Payer: Self-pay | Admitting: Podiatry

## 2022-04-24 DIAGNOSIS — M79675 Pain in left toe(s): Secondary | ICD-10-CM | POA: Diagnosis not present

## 2022-04-24 DIAGNOSIS — M79674 Pain in right toe(s): Secondary | ICD-10-CM

## 2022-04-24 DIAGNOSIS — B351 Tinea unguium: Secondary | ICD-10-CM

## 2022-04-24 DIAGNOSIS — I739 Peripheral vascular disease, unspecified: Secondary | ICD-10-CM

## 2022-04-24 NOTE — Progress Notes (Signed)
This patient returns to my office for at risk foot care.  This patient requires this care by a professional since this patient will be at risk due to having  PAD.  This patient is unable to cut nails himself since the patient cannot reach his nails.These nails are painful walking and wearing shoes.  This patient presents for at risk foot care today.  General Appearance  Alert, conversant and in no acute stress.  Vascular  Dorsalis pedis and posterior tibial  pulses are weakly  palpable  bilaterally.  Capillary return is within normal limits  bilaterally. Temperature is within normal limits  bilaterally.  Neurologic  Senn-Weinstein monofilament wire test within normal limits  bilaterally. Muscle power within normal limits bilaterally.  Nails Thick disfigured discolored nails with subungual debris  from hallux to fifth toes bilaterally. No evidence of bacterial infection or drainage bilaterally.  Orthopedic  No limitations of motion  feet .  No crepitus or effusions noted.  No bony pathology or digital deformities noted.  Skin  normotropic skin with no porokeratosis noted bilaterally.  No signs of infections or ulcers noted.     Onychomycosis  Pain in right toes  Pain in left toes  Consent was obtained for treatment procedures.   Mechanical debridement of nails 1-5  bilaterally performed with a nail nipper.  Filed with dremel without incident.    Return office visit   3 months                   Told patient to return for periodic foot care and evaluation due to potential at risk complications.   Opal Dinning DPM   

## 2022-04-26 DIAGNOSIS — G4733 Obstructive sleep apnea (adult) (pediatric): Secondary | ICD-10-CM | POA: Diagnosis not present

## 2022-04-26 DIAGNOSIS — I10 Essential (primary) hypertension: Secondary | ICD-10-CM | POA: Diagnosis not present

## 2022-04-29 DIAGNOSIS — I10 Essential (primary) hypertension: Secondary | ICD-10-CM | POA: Diagnosis not present

## 2022-04-29 DIAGNOSIS — G4733 Obstructive sleep apnea (adult) (pediatric): Secondary | ICD-10-CM | POA: Diagnosis not present

## 2022-05-02 ENCOUNTER — Other Ambulatory Visit: Payer: Self-pay | Admitting: Internal Medicine

## 2022-05-30 DIAGNOSIS — G4733 Obstructive sleep apnea (adult) (pediatric): Secondary | ICD-10-CM | POA: Diagnosis not present

## 2022-05-30 DIAGNOSIS — I10 Essential (primary) hypertension: Secondary | ICD-10-CM | POA: Diagnosis not present

## 2022-06-29 DIAGNOSIS — G4733 Obstructive sleep apnea (adult) (pediatric): Secondary | ICD-10-CM | POA: Diagnosis not present

## 2022-06-29 DIAGNOSIS — I10 Essential (primary) hypertension: Secondary | ICD-10-CM | POA: Diagnosis not present

## 2022-07-10 DIAGNOSIS — H6123 Impacted cerumen, bilateral: Secondary | ICD-10-CM | POA: Diagnosis not present

## 2022-07-10 DIAGNOSIS — H903 Sensorineural hearing loss, bilateral: Secondary | ICD-10-CM | POA: Diagnosis not present

## 2022-07-23 NOTE — Progress Notes (Unsigned)
Home Cardiology Office Note  Date:  07/24/2022   ID:  David Atkins, DOB Feb 02, 1929, MRN [id number]  PCP:  [name], MD   Chief Complaint  Patient presents with   12 month follow up     Patient c/o bialteral LE edema. Medications reviewed by the patient's daughter Rip Harbour).     HPI:  Mr. Teschner is a very pleasant 88 year old gentleman with a history of  coronary artery disease, bypass surgery in 2006 at [facility] in [place],  mild carotid arterial disease by report,  hyperlipidemia,  small smoking history in the remote past,   aortic athero on CT 08/2018 who presents for routine followup of his coronary artery disease .   Last seen in clinic June 2021 In follow-up today he presents with his daughter Reports that he feels well, has chronic lower extremity edema, wears TED hose Denies significant abdominal distention or shortness of breath To walk very far, uses a walker Daughter reports she is given up trying to get him to do regular exercise or walk much outside He prefers not to go walking Daughter reports that he sits most of the day, likely contributing to leg swelling  Blood pressure elevated in the office today, improved on recheck Home BP 140s at home on average  Denies any chest pain concerning for angina Helps to take care of his wife  No recent lab work available, last in September 2022  EKG personally reviewed by myself on todays visit shows normal sinus rhythm with rate 70 bpm, no significant ST or T-wave changes   Other past medical history Medical records reviewed, was seen in the emergency room admitted to the hospital with syncope January 2020 --walking into his house today when he got dizzy and passed out.  He did fall and hit his head , memory difficulty related to the events  in the waiting room  in the ED, he nearly passed out again and appeared very pale.  Normal labs It does not appear that any medication changes were  made at discharge Etiology unclear, possibly vasovagal   01/06/2014, he reported that he had general malaise, blood pressure checked showed systolic pressure greater than 200. He went to the emergency room for further evaluation. Workup there was negative including normal basic metabolic panel, negative cardiac enzymes. CT scan showed chronic microvascular changes in the deep white matter, and he was discharged home after medical management of his blood pressure. On a previous visit, his Son was concerned about various arguments he may be having with other family members at home and the stress this may be causing him.     PMH:   has a past medical history of 3-vessel coronary artery disease (2006), basal cell, Basal cell carcinoma (07/09/2012), BPH (benign prostatic hyperplasia), GERD (gastroesophageal reflux disease), Hyperlipidemia, Hypertension, Lumbar stenosis (12/03/13), Myocardial infarction ([place]) (2006), OAB (overactive bladder), Osteoporosis (2010), Sleep apnea, and Spondylolisthesis of lumbar region (11/23/13).  PSH:    Past Surgical History:  Procedure Laterality Date   APPENDECTOMY  1951   COLONOSCOPY     COLONOSCOPY WITH PROPOFOL N/A 11/15/2015   Procedure: COLONOSCOPY WITH PROPOFOL;  Surgeon: Hulen Luster, MD;  Location: [facility];  Service: Gastroenterology;  Laterality: N/A;   CORONARY ARTERY BYPASS GRAFT  2006   3 vessel, s/p AMI   NASAL SEPTOPLASTY W/ TURBINOPLASTY  1981   TONSILLECTOMY      Current Outpatient Medications  Medication Sig Dispense Refill   acetaminophen (TYLENOL) 500 MG tablet  Take 500 mg by mouth at bedtime.      amLODipine (NORVASC) 2.5 MG tablet TAKE 1 TABLET BY MOUTH  DAILY 100 tablet 2   aspirin 81 MG EC tablet Take 81 mg by mouth daily.      Azelastine-Fluticasone 137-50 MCG/ACT SUSP Place 1 spray into both nostrils 2 (two) times daily as needed. 23 g 5   Blood Pressure Monitoring (BLOOD PRESSURE MONITOR AUTOMAT) DEVI Use daily to check blood  pressure 1 Device 0   cholecalciferol (VITAMIN D) 25 MCG (1000 UNIT) tablet      cyanocobalamin (,VITAMIN B-12,) 1000 MCG/ML injection INJECT 1 ML (1,000 MCG TOTAL) INTO THE MUSCLE ONCE A WEEK. FOR 4 WEEKS, THEN MONTHLY THEREAFTER 12 mL 1   diclofenac Sodium (VOLTAREN) 1 % GEL APPLY 4 GRAMS TOPICALLY 4  TIMES DAILY 400 g 0   erythromycin ophthalmic ointment SMARTSIG:0.25 Sparingly In Eye(s) Every Night     finasteride (PROSCAR) 5 MG tablet TAKE 1 TABLET BY MOUTH  DAILY 90 tablet 3   lisinopril (ZESTRIL) 20 MG tablet TAKE 1 TABLET BY MOUTH  DAILY 100 tablet 2   metoprolol tartrate (LOPRESSOR) 25 MG tablet TAKE ONE-HALF TABLET BY  MOUTH TWICE DAILY 90 tablet 3   nitroGLYCERIN (NITROSTAT) 0.4 MG SL tablet Place 1 tablet (0.4 mg total) under the tongue every 5 (five) minutes as needed for chest pain. Maximum of 3 doses. 25 tablet 3   omeprazole (PRILOSEC) 20 MG capsule TAKE 2 CAPSULES BY MOUTH  DAILY 180 capsule 3   oxybutynin (DITROPAN) 5 MG tablet TAKE 1 TABLET BY MOUTH  TWICE DAILY 200 tablet 2   polyethylene glycol powder (GLYCOLAX/MIRALAX) powder Take 17 g by mouth once. Dissolve 1 capful of power into any liquid and drink once daily (Patient taking differently: Take 17 g by mouth at bedtime. Dissolve 1 capful of power into any liquid and drink once daily) 500 g 0   rOPINIRole (REQUIP) 0.25 MG tablet TAKE 1 TABLET BY MOUTH AT  BEDTIME 100 tablet 2   Simethicone Extra Strength 125 MG CAPS Take 125 mg by mouth as needed (gas).     simvastatin (ZOCOR) 20 MG tablet TAKE 1 TABLET BY MOUTH AT  BEDTIME 100 tablet 2   Syringe/Needle, Disp, (SYRINGE 3CC/25GX1") 25G X 1" 3 ML MISC Use for b12 injections 50 each 0   tamsulosin (FLOMAX) 0.4 MG CAPS capsule TAKE 1 CAPSULE BY MOUTH  DAILY AFTER BREAKFAST 100 capsule 2   escitalopram (LEXAPRO) 10 MG tablet TAKE 1 TABLET BY MOUTH EVERY DAY (Patient not taking: Reported on 07/24/2022) 90 tablet 1   No current facility-administered medications for this visit.      Allergies:   Sulfa antibiotics   Social History:  The patient  reports that he quit smoking about 59 years ago. His smoking use included cigarettes. He has never used smokeless tobacco. He reports that he does not drink alcohol and does not use drugs.   Family History:   family history includes Hyperlipidemia in his mother; Hypertension in his father and mother.    Review of Systems: Review of Systems  Constitutional: Negative.   Respiratory: Negative.    Cardiovascular: Negative.   Gastrointestinal: Negative.   Musculoskeletal:  Positive for back pain.  Neurological: Negative.   Psychiatric/Behavioral: Negative.    All other systems reviewed and are negative.   PHYSICAL EXAM: VS:  BP (!) 160/80 (BP Location: Left Arm, Patient Position: Sitting, Cuff Size: Normal)   Pulse 70   Ht  $'5\' 10"'A$  (1.778 m)   Wt 191 lb (86.6 kg)   SpO2 96%   BMI 27.41 kg/m  , BMI Body mass index is 27.41 kg/m. Constitutional:  oriented to person, place, and time. No distress.  HENT:  Head: Grossly normal Eyes:  no discharge. No scleral icterus.  Neck: No JVD, no carotid bruits  Cardiovascular: Regular rate and rhythm, no murmurs appreciated Pulmonary/Chest: Clear to auscultation bilaterally, no wheezes or rails Abdominal: Soft.  no distension.  no tenderness.  Musculoskeletal: Normal range of motion Neurological:  normal muscle tone. Coordination normal. No atrophy Skin: Skin warm and dry Psychiatric: normal affect, pleasant  Recent Labs: 08/31/2021: ALT 17; BUN 11; Creatinine, Ser 0.78; Hemoglobin 14.7; Magnesium 1.8; Platelets 203.0; Potassium 4.1; Sodium 142    Lipid Panel Lab Results  Component Value Date   CHOL 125 11/17/2019   HDL 42.00 11/17/2019   LDLCALC 62 11/17/2019   TRIG 107.0 11/17/2019      Wt Readings from Last 3 Encounters:  07/24/22 191 lb (86.6 kg)  01/05/22 187 lb (84.8 kg)  11/15/21 187 lb (84.8 kg)      ASSESSMENT AND PLAN:  3-vessel coronary artery  disease - Plan: EKG 12-Lead Currently with no symptoms of angina. No further workup at this time. Continue current medication regimen.  Essential hypertension  Pressure elevated on arrival, moderate improvement on recheck Recommend he continue to monitor blood pressure at home and call us if numbers run high If additional medication is needed, could consider HCTZ given leg swelling  Lower extremity swelling/edema Likely component of dependent edema, possibly exacerbated by amlodipine He prefers not to be on diuretics given polyuria, nocturia Recommend he call us if leg swelling gets worse especially if he has abdominal distention or shortness of breath or coughing consistent with CHF symptoms Daughter will help [place] monitor for symptoms  Hyperlipidemia LDL goal <70 Prior lab work, numbers at goal  Bilateral carotid artery stenosis History of mild bilateral carotid disease Cholesterol numbers at goal .  Hx of CABG Stable,  no new testing  OSA On CPAP   Total encounter time more than 30 minutes  Greater than 50% was spent in counseling and coordination of care with the patient   Orders Placed This Encounter  Procedures   EKG 12-Lead     Signed, Esmond Plants, M.D., Ph.D. 07/24/2022  [place], [place] [phone]

## 2022-07-24 ENCOUNTER — Encounter: Payer: Self-pay | Admitting: Cardiovascular Disease

## 2022-07-24 ENCOUNTER — Ambulatory Visit: Payer: Medicare Other | Admitting: Cardiovascular Disease

## 2022-07-24 VITALS — BP 130/60 | HR 70 | Ht 70.0 in | Wt 191.0 lb

## 2022-07-24 DIAGNOSIS — I6523 Occlusion and stenosis of bilateral carotid arteries: Secondary | ICD-10-CM

## 2022-07-24 DIAGNOSIS — E785 Hyperlipidemia, unspecified: Secondary | ICD-10-CM

## 2022-07-24 DIAGNOSIS — I739 Peripheral vascular disease, unspecified: Secondary | ICD-10-CM

## 2022-07-24 DIAGNOSIS — I25118 Atherosclerotic heart disease of native coronary artery with other forms of angina pectoris: Secondary | ICD-10-CM | POA: Diagnosis not present

## 2022-07-24 DIAGNOSIS — I10 Essential (primary) hypertension: Secondary | ICD-10-CM

## 2022-07-24 NOTE — Patient Instructions (Addendum)
CAll if leg swelling gets worse, We would start a water pill (lasix/furosemide) as needed 1-2 a week   Medication Instructions:  No changes  If you need a refill on your cardiac medications before your next appointment, please call your pharmacy.   Lab work: No new labs needed  Testing/Procedures: No new testing needed  Follow-Up: At [facility], you and your health needs are our priority.  As part of our continuing mission to provide you with exceptional heart care, we have created designated Provider Care Teams.  These Care Teams include your primary Cardiologist (physician) and Advanced Practice Providers (APPs -  Physician Assistants and Nurse Practitioners) who all work together to provide you with the care you need, when you need it.  You will need a follow up appointment in 6 months, APP ok  Providers on your designated Care Team:   Murray Hodgkins, NP Christell Faith, PA-C Cadence Kathlen Mody, [place]  COVID-19 Vaccine Information can be found at: [email or web address] For questions related to vaccine distribution or appointments, please email [email or web address] or call [phone].

## 2022-07-30 DIAGNOSIS — I10 Essential (primary) hypertension: Secondary | ICD-10-CM | POA: Diagnosis not present

## 2022-07-30 DIAGNOSIS — G4733 Obstructive sleep apnea (adult) (pediatric): Secondary | ICD-10-CM | POA: Diagnosis not present

## 2022-08-02 ENCOUNTER — Other Ambulatory Visit: Payer: Self-pay | Admitting: Internal Medicine

## 2022-08-17 DIAGNOSIS — R609 Edema, unspecified: Secondary | ICD-10-CM | POA: Diagnosis not present

## 2022-08-17 DIAGNOSIS — I10 Essential (primary) hypertension: Secondary | ICD-10-CM | POA: Diagnosis not present

## 2022-08-17 DIAGNOSIS — W19XXXA Unspecified fall, initial encounter: Secondary | ICD-10-CM | POA: Diagnosis not present

## 2022-08-17 DIAGNOSIS — R42 Dizziness and giddiness: Secondary | ICD-10-CM | POA: Diagnosis not present

## 2022-08-18 ENCOUNTER — Telehealth: Payer: Self-pay | Admitting: Cardiovascular Disease

## 2022-08-18 NOTE — Telephone Encounter (Signed)
Pt c/o Syncope: STAT if syncope occurred within 30 minutes and pt complains of lightheadedness High Priority if episode of passing out, completely, today or in last 24 hours   Did you pass out today?  He passed out last night  When is the last time you passed out? Last night   Has this occurred multiple times? yes   Did you have any symptoms prior to passing out? Woozy and weak -I made patient an appointment for Tuesday with Cadence His Daughter is in [place] and will be home on Monday night- Please call to evaluate

## 2022-08-18 NOTE — Telephone Encounter (Signed)
Patient's daughter Rip Harbour is returning call. Please advise.

## 2022-08-18 NOTE — Telephone Encounter (Signed)
Spoke with the patient's daughter who states that she is currently out of the country but she was informed that her father passed out last night. She states that he was feeling woozy and tired and slowly fell to the ground as he was walking out to the garage. She states that he did not hit his head. She states that this has happened several times in the past. EMS came out and evaluated him. They said that there was an issue with his EKG but she was not sure what it was. She states that he felt better shortly after the episode and was not taken to the hospital. She states that he was not having any stroke-like symptoms. The patient's wife and neighbor are watching him closely and aware of when to call 911 again. Patient is scheduled to be seen on Tuesday.

## 2022-08-18 NOTE — Telephone Encounter (Signed)
Left message for patient to call back  

## 2022-08-19 ENCOUNTER — Other Ambulatory Visit: Payer: Self-pay | Admitting: Internal Medicine

## 2022-08-22 ENCOUNTER — Other Ambulatory Visit
Admission: RE | Admit: 2022-08-22 | Discharge: 2022-08-22 | Disposition: A | Discharge status: Home / Self Care | Payer: Medicare Other | Source: Ambulatory Visit | Attending: Medical | Admitting: Medical

## 2022-08-22 ENCOUNTER — Ambulatory Visit (INDEPENDENT_AMBULATORY_CARE_PROVIDER_SITE_OTHER): Payer: Medicare Other

## 2022-08-22 ENCOUNTER — Encounter: Payer: Self-pay | Admitting: Medical

## 2022-08-22 ENCOUNTER — Ambulatory Visit: Payer: Medicare Other | Attending: Medical | Admitting: Medical

## 2022-08-22 VITALS — BP 160/84 | HR 79 | Ht 70.0 in | Wt 196.2 lb

## 2022-08-22 DIAGNOSIS — R600 Localized edema: Secondary | ICD-10-CM | POA: Diagnosis not present

## 2022-08-22 DIAGNOSIS — I700 Atherosclerosis of aorta: Secondary | ICD-10-CM | POA: Insufficient documentation

## 2022-08-22 DIAGNOSIS — I10 Essential (primary) hypertension: Secondary | ICD-10-CM

## 2022-08-22 DIAGNOSIS — R55 Syncope and collapse: Secondary | ICD-10-CM | POA: Diagnosis not present

## 2022-08-22 DIAGNOSIS — Z8679 Personal history of other diseases of the circulatory system: Secondary | ICD-10-CM

## 2022-08-22 DIAGNOSIS — R5383 Other fatigue: Secondary | ICD-10-CM | POA: Diagnosis not present

## 2022-08-22 DIAGNOSIS — I6523 Occlusion and stenosis of bilateral carotid arteries: Secondary | ICD-10-CM | POA: Diagnosis not present

## 2022-08-22 DIAGNOSIS — G4733 Obstructive sleep apnea (adult) (pediatric): Secondary | ICD-10-CM | POA: Diagnosis not present

## 2022-08-22 DIAGNOSIS — I2510 Atherosclerotic heart disease of native coronary artery without angina pectoris: Secondary | ICD-10-CM

## 2022-08-22 LAB — COMPREHENSIVE METABOLIC PANEL
ALT: 21 U/L (ref 0–44)
AST: 25 U/L (ref 15–41)
Albumin: 3.7 g/dL (ref 3.5–5.0)
Alkaline Phosphatase: 57 U/L (ref 38–126)
Anion gap: 7 (ref 5–15)
BUN: 14 mg/dL (ref 8–23)
CO2: 28 mmol/L (ref 22–32)
Calcium: 8.7 mg/dL — ABNORMAL LOW (ref 8.9–10.3)
Chloride: 108 mmol/L (ref 98–111)
Creatinine, Ser: 0.81 mg/dL (ref 0.61–1.24)
GFR, Estimated: >60 mL/min (ref >60)
Glucose, Bld: 78 mg/dL (ref 70–99)
Potassium: 4.1 mmol/L (ref 3.5–5.1)
Sodium: 143 mmol/L (ref 135–145)
Total Bilirubin: 0.6 mg/dL (ref 0.3–1.2)
Total Protein: 6.5 g/dL (ref 6.5–8.1)

## 2022-08-22 LAB — CBC
HCT: 42.1 % (ref 39.0–52.0)
Hemoglobin: 13.7 g/dL (ref 13.0–17.0)
MCH: 32.2 pg (ref 26.0–34.0)
MCHC: 32.5 g/dL (ref 30.0–36.0)
MCV: 99.1 fL (ref 80.0–100.0)
Platelets: 203 10*3/uL (ref 150–400)
RBC: 4.25 MIL/uL (ref 4.22–5.81)
RDW: 12.9 % (ref 11.5–15.5)
WBC: 7.7 10*3/uL (ref 4.0–10.5)
nRBC: 0 % (ref 0.0–0.2)

## 2022-08-22 LAB — TSH: TSH: 2.205 u[IU]/mL (ref 0.350–4.500)

## 2022-08-22 LAB — MAGNESIUM: Magnesium: 1.8 mg/dL (ref 1.7–2.4)

## 2022-08-22 NOTE — Progress Notes (Signed)
Cardiology Office Note:    Date:  08/22/2022   ID:  David Atkins, DOB Apr 20, 1929, MRN [id number]  PCP:  [name], MD  [facility] Cardiologist:  None  [facility] Electrophysiologist:  None   Referring MD: [name], MD   Chief Complaint: Syncope  History of Present Illness:    David Atkins is a 88 y.o. male with a hx of CAD s/p CABG in 2006 in [place], mild carotid disease by report, HLD, OSA on CPAP, remote smoking history, aortic atherosclerosis on CT 2019 who presents for syncope.   He was admitted in 2020 for syncope. Echo showed LVEF 65-70%, mild LVH. Work-up was unremarkable, suspected vasovagal.   Last seen 07/26/22 and had LLE not on diuretics due to polyuria, nocturia with BPH. BP was mildly elevated. No changes were made  Today, the patient report a syncopal episode last Thursday night (5-6 days ag). He was trying to close the garage door, and while he was walking he had a brief fainting spell. He was leaning against the truck when it happened and he slowly fell down tot he floor. He did not hit his head or sustain any significant injuries. He went down to the floor and alarm button would not work and so he laid there and couldn't get up on his own. He was found by his wife and 911 was called. EMS came and EKG shows NSR with rare PACs. Patient was overall feeling better by this point. They offered to take him in to the ER, but patient declined. Before he went into the garage, he felt woozy and fatigued. He reported eating and drinking normally. May not drink enough given BPH.  He reported h/o syncope possibly from orthostatic hypotension int he past. Also, reported a history of epileptic seizures, but do not think this was what happened. Said he may have syncope episode once a year.  Past Medical History:  Diagnosis Date   3-vessel coronary artery disease 2006   [place]   basal cell    Basal cell carcinoma 07/09/2012   BPH (benign prostatic hyperplasia)     GERD (gastroesophageal reflux disease)    Hyperlipidemia    Hypertension    Lumbar stenosis 12/03/13   MRI - lumbar pain with radiation down to LE   Myocardial infarction [facility]) 2006   OAB (overactive bladder)    Osteoporosis 2010   by DEXA at [facility]   Sleep apnea    Spondylolisthesis of lumbar region 11/23/13   MRI Lumbar spine    Past Surgical History:  Procedure Laterality Date   APPENDECTOMY  1951   COLONOSCOPY     COLONOSCOPY WITH PROPOFOL N/A 11/15/2015   Procedure: COLONOSCOPY WITH PROPOFOL;  Surgeon: Hulen Luster, MD;  Location: [facility] ENDOSCOPY;  Service: Gastroenterology;  Laterality: N/A;   CORONARY ARTERY BYPASS GRAFT  2006   3 vessel, s/p AMI   NASAL SEPTOPLASTY W/ TURBINOPLASTY  1981   TONSILLECTOMY      Current Medications: Current Meds  Medication Sig   acetaminophen (TYLENOL) 500 MG tablet Take 500 mg by mouth at bedtime.    amLODipine (NORVASC) 2.5 MG tablet TAKE 1 TABLET BY MOUTH  DAILY   aspirin 81 MG EC tablet Take 81 mg by mouth daily.    Azelastine-Fluticasone 137-50 MCG/ACT SUSP Place 1 spray into both nostrils 2 (two) times daily as needed.   Blood Pressure Monitoring (BLOOD PRESSURE MONITOR AUTOMAT) DEVI Use daily to check blood pressure   cholecalciferol (VITAMIN D)  25 MCG (1000 UNIT) tablet    cyanocobalamin (,VITAMIN B-12,) 1000 MCG/ML injection INJECT 1 ML (1,000 MCG TOTAL) INTO THE MUSCLE ONCE A WEEK. FOR 4 WEEKS, THEN MONTHLY THEREAFTER   diclofenac Sodium (VOLTAREN) 1 % GEL APPLY 4 GRAMS TOPICALLY 4  TIMES DAILY   erythromycin ophthalmic ointment SMARTSIG:0.25 Sparingly In Eye(s) Every Night   escitalopram (LEXAPRO) 10 MG tablet TAKE 1 TABLET BY MOUTH EVERY DAY   finasteride (PROSCAR) 5 MG tablet TAKE 1 TABLET BY MOUTH  DAILY   lisinopril (ZESTRIL) 20 MG tablet TAKE 1 TABLET BY MOUTH  DAILY   metoprolol tartrate (LOPRESSOR) 25 MG tablet TAKE ONE-HALF TABLET BY MOUTH  TWICE DAILY   nitroGLYCERIN (NITROSTAT) 0.4 MG SL tablet Place 1 tablet (0.4 mg total)  under the tongue every 5 (five) minutes as needed for chest pain. Maximum of 3 doses.   omeprazole (PRILOSEC) 20 MG capsule TAKE 2 CAPSULES BY MOUTH  DAILY   oxybutynin (DITROPAN) 5 MG tablet TAKE 1 TABLET BY MOUTH  TWICE DAILY   polyethylene glycol powder (GLYCOLAX/MIRALAX) powder Take 17 g by mouth once. Dissolve 1 capful of power into any liquid and drink once daily (Patient taking differently: Take 17 g by mouth at bedtime. Dissolve 1 capful of power into any liquid and drink once daily)   rOPINIRole (REQUIP) 0.25 MG tablet TAKE 1 TABLET BY MOUTH AT  BEDTIME   Simethicone Extra Strength 125 MG CAPS Take 125 mg by mouth as needed (gas).   simvastatin (ZOCOR) 20 MG tablet TAKE 1 TABLET BY MOUTH AT  BEDTIME   Syringe/Needle, Disp, (SYRINGE 3CC/25GX1") 25G X 1" 3 ML MISC Use for b12 injections   tamsulosin (FLOMAX) 0.4 MG CAPS capsule TAKE 1 CAPSULE BY MOUTH  DAILY AFTER BREAKFAST     Allergies:   Sulfa antibiotics   Social History   Socioeconomic History   Marital status: Married    Spouse name: Not on file   Number of children: Not on file   Years of education: Not on file   Highest education level: Not on file  Occupational History   Not on file  Tobacco Use   Smoking status: Former    Types: Cigarettes    Quit date: 07/10/1963    Years since quitting: 59.1   Smokeless tobacco: Never  Vaping Use   Vaping Use: Never used  Substance and Sexual Activity   Alcohol use: No   Drug use: No   Sexual activity: Not Currently  Other Topics Concern   Not on file  Social History Narrative   Not on file   Social Determinants of Health   Financial Resource Strain: Low Risk  (01/05/2022)   Overall Financial Resource Strain (CARDIA)    Difficulty of Paying Living Expenses: Not hard at all  Food Insecurity: No Food Insecurity (01/05/2022)   Hunger Vital Sign    Worried About Running Out of Food in the Last Year: Never true    [place] in the Last Year: Never true  Transportation  Needs: No Transportation Needs (01/05/2022)   PRAPARE - Hydrologist (Medical): No    Lack of Transportation (Non-Medical): No  Physical Activity: Inactive (11/17/2020)   Exercise Vital Sign    Days of Exercise per Week: 0 days    Minutes of Exercise per Session: 0 min  Stress: No Stress Concern Present (01/05/2022)   [place]    Feeling of  Stress : Not at all  Social Connections: Unknown (01/05/2022)   Social Connection and Isolation Panel [NHANES]    Frequency of Communication with Friends and Family: Not on file    Frequency of Social Gatherings with Friends and Family: Not on file    Attends Religious Services: Not on file    Active Member of Clubs or Organizations: Not on file    Attends Archivist Meetings: Not on file    Marital Status: Married     Family History: The patient's family history includes Hyperlipidemia in his mother; Hypertension in his father and mother.  ROS:   Please see the history of present illness.     All other systems reviewed and are negative.  EKGs/Labs/Other Studies Reviewed:    The following studies were reviewed today:  Echo 12/2018 Study Conclusions   - Procedure narrative: Transthoracic echocardiography for left    ventricular function evaluation, for right ventricular function    evaluation, and for assessment of valvular function. Image    quality was poor. The study was technically difficult, as a    result of restricted patient mobility.  - Left ventricle: The cavity size was normal. Wall thickness was    increased in a pattern of mild LVH. Systolic function was    vigorous. The estimated ejection fraction was in the range of 65%    to 70%. The study is not technically sufficient to allow    evaluation of LV diastolic function.  EKG:  EKG is ordered today.  The ekg ordered today demonstrates NSR 79bpm, PVC, nonspecific T wave  changes, overall stable  Recent Labs: 08/31/2021: ALT 17; BUN 11; Creatinine, Ser 0.78; Hemoglobin 14.7; Magnesium 1.8; Platelets 203.0; Potassium 4.1; Sodium 142  Recent Lipid Panel    Component Value Date/Time   CHOL 125 11/17/2019 1017   TRIG 107.0 11/17/2019 1017   HDL 42.00 11/17/2019 1017   CHOLHDL 3 11/17/2019 1017   VLDL 21.4 11/17/2019 1017   LDLCALC 62 11/17/2019 1017   LDLDIRECT CANCELED 11/02/2017 1632    Physical Exam:    VS:  BP (!) 160/84 (BP Location: Left Arm, Patient Position: Sitting, Cuff Size: Normal)   Pulse 79   Ht '5\' 10"'$  (1.778 m)   Wt 196 lb 3.2 oz (89 kg)   SpO2 96%   BMI 28.15 kg/m     Wt Readings from Last 3 Encounters:  08/22/22 196 lb 3.2 oz (89 kg)  07/24/22 191 lb (86.6 kg)  01/05/22 187 lb (84.8 kg)     GEN:  Well nourished, well developed in no acute distress HEENT: Normal NECK: No JVD; No carotid bruits LYMPHATICS: No lymphadenopathy CARDIAC: RRR, no murmurs, rubs, gallops RESPIRATORY:  Clear to auscultation without rales, wheezing or rhonchi  ABDOMEN: Soft, non-tender, non-distended MUSCULOSKELETAL:  mild lower leg edema; No deformity  SKIN: Warm and dry NEUROLOGIC:  Alert and oriented x 3 PSYCHIATRIC:  Normal affect   ASSESSMENT:    1. Syncope and collapse   2. History of orthostatic hypotension   3. Essential hypertension    PLAN:    In order of problems listed above:  Syncope H/o orthostatic hypotension and seizures Patient reports h/o fainting spells that occur maybe once a year. Also with history of orthostatic symptoms and epileptic seizures with bowel incontinence. Unclear etiology of most recent syncopal episode, orthostatic vs vasovagal possible exacerbated by dehydration. No Bowel incontinence reported. No anginal symptoms reported. BP today is high since he did not have medications  today. Orthostatics vitals are negative today. I will check a CMET, TSH, Mag, CBC. I will order a 2 week heart monitor and an  echocardiogram. We will see him back after this.  CAD s/p remote CABG No anginal symptoms reported. Plan as above. Continue ASA, statin and BB therapy.   HTN BP high, but did not have his medications yet today. Orthostatics negative today. Continue Lisinopril Amlodipine, and Lopressor.   LLE Stable, normally wears compression hoes. He prefers to not use diuretic given polyuria, nocturia, BPH.   Bilateral carotid disease This is followed by Dr. Lucky Cowboy. Most recent studies 11/2021 did not show significant disease.    Disposition: Follow up in 6-8 week(s) with MD/APP    Signed, Levora Werden Ninfa Meeker, PA-C  08/22/2022 9:37 AM    [facility]

## 2022-08-22 NOTE — Patient Instructions (Signed)
Medication Instructions:  Your physician recommends that you continue on your current medications as directed. Please refer to the Current Medication list given to you today.  *If you need a refill on your cardiac medications before your next appointment, please call your pharmacy*  Lab Work: CMP, CBC, TSH, and Magnesium level  - Please go to the [facility]. You will check in at the front desk to the right as you walk into the atrium. Valet Parking is offered if needed. - No appointment needed. You may go any day between 7 am and 6 pm.   If you have labs (blood work) drawn today and your tests are completely normal, you will receive your results only by: [place] (if you have MyChart) OR A paper copy in the mail If you have any lab test that is abnormal or we need to change your treatment, we will call you to review the results.   Testing/Procedures: Your physician has requested that you have an echocardiogram. Echocardiography is a painless test that uses sound waves to create images of your heart. It provides your doctor with information about the size and shape of your heart and how well your heart's chambers and valves are working. This procedure takes approximately one hour. There are no restrictions for this procedure.  Your physician has recommended that you wear a Zio monitor.   This monitor is a medical device that records the heart's electrical activity. Doctors most often use these monitors to diagnose arrhythmias. Arrhythmias are problems with the speed or rhythm of the heartbeat. The monitor is a small device applied to your chest. You can wear one while you do your normal daily activities. While wearing this monitor if you have any symptoms to push the button and record what you felt. Once you have worn this monitor for the period of time provider prescribed (Usually 14 days), you will return the monitor device in the postage paid box. Once it is returned they will  download the data collected and provide [place] with a report which the provider will then review and we will call you with those results. Important tips:  Avoid showering during the first 24 hours of wearing the monitor. Avoid excessive sweating to help maximize wear time. Do not submerge the device, no hot tubs, and no swimming pools. Keep any lotions or oils away from the patch. After 24 hours you may shower with the patch on. Take brief showers with your back facing the shower head.  Do not remove patch once it has been placed because that will interrupt data and decrease adhesive wear time. Push the button when you have any symptoms and write down what you were feeling. Once you have completed wearing your monitor, remove and place into box which has postage paid and place in your outgoing mailbox.  If for some reason you have misplaced your box then call our office and we can provide another box and/or mail it off for you.  Follow-Up: At [facility], you and your health needs are our priority.  As part of our continuing mission to provide you with exceptional heart care, we have created designated Provider Care Teams.  These Care Teams include your primary Cardiologist (physician) and Advanced Practice Providers (APPs -  Physician Assistants and Nurse Practitioners) who all work together to provide you with the care you need, when you need it.  We recommend signing up for the patient portal called "MyChart".  Sign up information is  provided on this After Visit Summary.  MyChart is used to connect with patients for Virtual Visits (Telemedicine).  Patients are able to view lab/test results, encounter notes, upcoming appointments, etc.  Non-urgent messages can be sent to your provider as well.   To learn more about what you can do with MyChart, go to [email or web address].    Your next appointment:   6-8 week(s)  The format for your next appointment:   In Person  Provider:   You  may see Dr. Ida Rogue or one of the following Advanced Practice Providers on your designated Care Team:   Murray Hodgkins, NP Christell Faith, PA-C Cadence Kathlen Mody, PA-C Gerrie Nordmann, NP   Important Information About [place]

## 2022-08-24 ENCOUNTER — Ambulatory Visit: Payer: Medicare Other | Admitting: Podiatry

## 2022-08-24 ENCOUNTER — Encounter: Payer: Self-pay | Admitting: Podiatry

## 2022-08-24 DIAGNOSIS — I739 Peripheral vascular disease, unspecified: Secondary | ICD-10-CM | POA: Diagnosis not present

## 2022-08-24 DIAGNOSIS — B351 Tinea unguium: Secondary | ICD-10-CM

## 2022-08-24 DIAGNOSIS — M79674 Pain in right toe(s): Secondary | ICD-10-CM | POA: Diagnosis not present

## 2022-08-24 DIAGNOSIS — M79675 Pain in left toe(s): Secondary | ICD-10-CM | POA: Diagnosis not present

## 2022-08-24 NOTE — Progress Notes (Signed)
This patient returns to my office for at risk foot care.  This patient requires this care by a professional since this patient will be at risk due to having  PAD.  This patient is unable to cut nails himself since the patient cannot reach his nails.These nails are painful walking and wearing shoes.  This patient presents for at risk foot care today.  General Appearance  Alert, conversant and in no acute stress.  Vascular  Dorsalis pedis and posterior tibial  pulses are weakly  palpable  bilaterally.  Capillary return is within normal limits  bilaterally. Temperature is within normal limits  bilaterally.  Neurologic  Senn-Weinstein monofilament wire test within normal limits  bilaterally. Muscle power within normal limits bilaterally.  Nails Thick disfigured discolored nails with subungual debris  from hallux to fifth toes bilaterally. No evidence of bacterial infection or drainage bilaterally.  Orthopedic  No limitations of motion  feet .  No crepitus or effusions noted.  No bony pathology or digital deformities noted.  Skin  normotropic skin with no porokeratosis noted bilaterally.  No signs of infections or ulcers noted.     Onychomycosis  Pain in right toes  Pain in left toes  Consent was obtained for treatment procedures.   Mechanical debridement of nails 1-5  bilaterally performed with a nail nipper.  Filed with dremel without incident.    Return office visit   3 months                   Told patient to return for periodic foot care and evaluation due to potential at risk complications.   Monterio Bob DPM   

## 2022-08-30 DIAGNOSIS — G4733 Obstructive sleep apnea (adult) (pediatric): Secondary | ICD-10-CM | POA: Diagnosis not present

## 2022-08-30 DIAGNOSIS — I10 Essential (primary) hypertension: Secondary | ICD-10-CM | POA: Diagnosis not present

## 2022-09-15 ENCOUNTER — Telehealth: Payer: Self-pay | Admitting: Internal Medicine

## 2022-09-15 NOTE — Telephone Encounter (Signed)
oxybutynin (DITROPAN) 5 MG tablet The doctor his sister goes to stated that he should not be taking oxybutynin (DITROPAN) 5 MG tablet. He informed her that it would cause these symptoms. Mental status issues ,bowel issues ,falls and constipation. 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 The patient would like a call back.

## 2022-09-18 ENCOUNTER — Encounter: Payer: Self-pay | Admitting: Internal Medicine

## 2022-09-18 DIAGNOSIS — N3281 Overactive bladder: Secondary | ICD-10-CM | POA: Insufficient documentation

## 2022-09-18 NOTE — Telephone Encounter (Signed)
See telephone encounter. Pt and pt's daughter are aware of the message you sent in the telephone encounter.

## 2022-09-18 NOTE — Telephone Encounter (Signed)
Pt and pt's daughter is aware. They both stated that pt is not having any side effects from mediation and will continue to take it.

## 2022-09-27 ENCOUNTER — Ambulatory Visit (INDEPENDENT_AMBULATORY_CARE_PROVIDER_SITE_OTHER): Payer: Medicare Other | Admitting: Family Medicine

## 2022-09-27 ENCOUNTER — Encounter: Payer: Self-pay | Admitting: Family Medicine

## 2022-09-27 ENCOUNTER — Ambulatory Visit: Payer: Medicare Other | Attending: Medical

## 2022-09-27 VITALS — BP 122/60 | HR 70 | Temp 97.5°F | Wt 195.0 lb

## 2022-09-27 DIAGNOSIS — R55 Syncope and collapse: Secondary | ICD-10-CM | POA: Diagnosis not present

## 2022-09-27 DIAGNOSIS — S61412A Laceration without foreign body of left hand, initial encounter: Secondary | ICD-10-CM | POA: Diagnosis not present

## 2022-09-27 LAB — ECHOCARDIOGRAM COMPLETE
AR max vel: 2.18 cm2
AV Area VTI: 2.21 cm2
AV Area mean vel: 2.19 cm2
AV Mean grad: 3 mmHg
AV Peak grad: 6.3 mmHg
Ao pk vel: 1.25 m/s
Area-P 1/2: 2.11 cm2
Weight: 3120 oz

## 2022-09-27 MED ORDER — PERFLUTREN LIPID MICROSPHERE
1.0000 mL | INTRAVENOUS | Status: AC | PRN
  Administered 2022-09-27: 2 mL via INTRAVENOUS

## 2022-09-27 NOTE — Progress Notes (Signed)
Patient ID: David Atkins, male    DOB: 1929-01-04, 88 y.o.   MRN: [id number]  This visit was conducted in person.  BP 122/60   Pulse 70   Temp (!) 97.5 F (36.4 C) (Temporal)   Wt 195 lb (88.5 kg)   SpO2 97%   BMI 27.98 kg/m    CC:  Chief Complaint  Patient presents with   Wound Check    L hand x 28 days. Denies fever    Subjective:   HPI: David Atkins is a 88 y.o. male patient of Dr. Lupita Dawn with history of hypertension, coronary artery disease, generalized anxiety disorder, impaired glucose tolerance and peripheral artery disease presenting on 09/27/2022 for Wound Check (L hand x 28 days. Denies fever)  He presents with family members today. He reports an injury to his left hand approximately 1 month ago... dog jumped up and tore skin off of hand and puncture. Flap of skin.   Family started wound care with cleaning, Vaseline and bandage. Left flap of skin in place.   Seems to be healing but very slowly.  Minimal painful. small amount of discharge clear bloody. Has some strong odor in last week.  No fever.   Wilburn Mylar has felt more irritated feeling  Daughter Vaughan Basta noted after she  had been gone 5 days.Marland Kitchen dead sloughing skin and odor... improved some with  cleaning last night.  Has been trying to elevated.  Minimal redness and heat.       Relevant past medical, surgical, family and social history reviewed and updated as indicated. Interim medical history since our last visit reviewed. Allergies and medications reviewed and updated. Outpatient Medications Prior to Visit  Medication Sig Dispense Refill   acetaminophen (TYLENOL) 500 MG tablet Take 500 mg by mouth at bedtime.      amLODipine (NORVASC) 2.5 MG tablet TAKE 1 TABLET BY MOUTH  DAILY 100 tablet 2   aspirin 81 MG EC tablet Take 81 mg by mouth daily.      Azelastine-Fluticasone 137-50 MCG/ACT SUSP Place 1 spray into both nostrils 2 (two) times daily as needed. 23 g 5   Blood Pressure Monitoring (BLOOD  PRESSURE MONITOR AUTOMAT) DEVI Use daily to check blood pressure 1 Device 0   cholecalciferol (VITAMIN D) 25 MCG (1000 UNIT) tablet      cyanocobalamin (,VITAMIN B-12,) 1000 MCG/ML injection INJECT 1 ML (1,000 MCG TOTAL) INTO THE MUSCLE ONCE A WEEK. FOR 4 WEEKS, THEN MONTHLY THEREAFTER 12 mL 1   diclofenac Sodium (VOLTAREN) 1 % GEL APPLY 4 GRAMS TOPICALLY 4  TIMES DAILY 400 g 0   erythromycin ophthalmic ointment SMARTSIG:0.25 Sparingly In Eye(s) Every Night     escitalopram (LEXAPRO) 10 MG tablet TAKE 1 TABLET BY MOUTH EVERY DAY 90 tablet 1   finasteride (PROSCAR) 5 MG tablet TAKE 1 TABLET BY MOUTH  DAILY 100 tablet 2   lisinopril (ZESTRIL) 20 MG tablet TAKE 1 TABLET BY MOUTH  DAILY 100 tablet 2   metoprolol tartrate (LOPRESSOR) 25 MG tablet TAKE ONE-HALF TABLET BY MOUTH  TWICE DAILY 100 tablet 2   nitroGLYCERIN (NITROSTAT) 0.4 MG SL tablet Place 1 tablet (0.4 mg total) under the tongue every 5 (five) minutes as needed for chest pain. Maximum of 3 doses. 25 tablet 3   omeprazole (PRILOSEC) 20 MG capsule TAKE 2 CAPSULES BY MOUTH  DAILY 180 capsule 3   oxybutynin (DITROPAN) 5 MG tablet TAKE 1 TABLET BY MOUTH  TWICE DAILY 200 tablet 2   polyethylene  glycol powder (GLYCOLAX/MIRALAX) powder Take 17 g by mouth once. Dissolve 1 capful of power into any liquid and drink once daily (Patient taking differently: Take 17 g by mouth at bedtime. Dissolve 1 capful of power into any liquid and drink once daily) 500 g 0   rOPINIRole (REQUIP) 0.25 MG tablet TAKE 1 TABLET BY MOUTH AT  BEDTIME 100 tablet 2   Simethicone Extra Strength 125 MG CAPS Take 125 mg by mouth as needed (gas).     simvastatin (ZOCOR) 20 MG tablet TAKE 1 TABLET BY MOUTH AT  BEDTIME 100 tablet 2   Syringe/Needle, Disp, (SYRINGE 3CC/25GX1") 25G X 1" 3 ML MISC Use for b12 injections 50 each 0   tamsulosin (FLOMAX) 0.4 MG CAPS capsule TAKE 1 CAPSULE BY MOUTH  DAILY AFTER BREAKFAST 100 capsule 2   No facility-administered medications prior to visit.      Per HPI unless specifically indicated in ROS section below Review of Systems  Constitutional:  Negative for fatigue and fever.  HENT:  Negative for ear pain.   Eyes:  Negative for pain.  Respiratory:  Negative for cough and shortness of breath.   Cardiovascular:  Negative for chest pain, palpitations and leg swelling.  Gastrointestinal:  Negative for abdominal pain.  Genitourinary:  Negative for dysuria.  Musculoskeletal:  Negative for arthralgias.  Neurological:  Negative for syncope, light-headedness and headaches.  Psychiatric/Behavioral:  Negative for dysphoric mood.    Objective:  BP 122/60   Pulse 70   Temp (!) 97.5 F (36.4 C) (Temporal)   Wt 195 lb (88.5 kg)   SpO2 97%   BMI 27.98 kg/m   Wt Readings from Last 3 Encounters:  09/27/22 195 lb (88.5 kg)  08/22/22 196 lb 3.2 oz (89 kg)  07/24/22 191 lb (86.6 kg)      Physical Exam Constitutional:      Appearance: He is well-developed.  HENT:     Head: Normocephalic.     Right Ear: Hearing normal.     Left Ear: Hearing normal.     Nose: Nose normal.  Neck:     Thyroid: No thyroid mass or thyromegaly.     Vascular: No carotid bruit.     Trachea: Trachea normal.  Cardiovascular:     Rate and Rhythm: Normal rate and regular rhythm.     Pulses: Normal pulses.     Heart sounds: Heart sounds not distant. No murmur heard.    No friction rub. No gallop.     Comments: No peripheral edema Pulmonary:     Effort: Pulmonary effort is normal. No respiratory distress.     Breath sounds: Normal breath sounds.  Skin:    General: Skin is warm and dry.     Findings: No rash.  Psychiatric:        Speech: Speech normal.        Behavior: Behavior normal.        Thought Content: Thought content normal.          Results for orders placed or performed during the hospital encounter of 08/22/22  Magnesium  Result Value Ref Range   Magnesium 1.8 1.7 - 2.4 mg/dL  TSH  Result Value Ref Range   TSH 2.205 0.350 - 4.500  uIU/mL  Comprehensive metabolic panel  Result Value Ref Range   Sodium 143 135 - 145 mmol/L   Potassium 4.1 3.5 - 5.1 mmol/L   Chloride 108 98 - 111 mmol/L   CO2 28 22 - 32 mmol/L  Glucose, Bld 78 70 - 99 mg/dL   BUN 14 8 - 23 mg/dL   Creatinine, Ser 0.81 0.61 - 1.24 mg/dL   Calcium 8.7 (L) 8.9 - 10.3 mg/dL   Total Protein 6.5 6.5 - 8.1 g/dL   Albumin 3.7 3.5 - 5.0 g/dL   AST 25 15 - 41 U/L   ALT 21 0 - 44 U/L   Alkaline Phosphatase 57 38 - 126 U/L   Total Bilirubin 0.6 0.3 - 1.2 mg/dL   GFR, Estimated >60 >60 mL/min   Anion gap 7 5 - 15  CBC  Result Value Ref Range   WBC 7.7 4.0 - 10.5 K/uL   RBC 4.25 4.22 - 5.81 MIL/uL   Hemoglobin 13.7 13.0 - 17.0 g/dL   HCT 42.1 39.0 - 52.0 %   MCV 99.1 80.0 - 100.0 fL   MCH 32.2 26.0 - 34.0 pg   MCHC 32.5 30.0 - 36.0 g/dL   RDW 12.9 11.5 - 15.5 %   Platelets 203 150 - 400 K/uL   nRBC 0.0 0.0 - 0.2 %     COVID 19 screen:  No recent travel or known exposure to COVID19 The patient denies respiratory symptoms of COVID 19 at this time. The importance of social distancing was discussed today.   Assessment and Plan Problem List Items Addressed This Visit     Laceration of left hand without foreign body - Primary    Acute, no current sign of bacterial superinfection.  Wound appears to be well-healing with granulation tissue. Wash wound daily with warm soapy water continue to debride any dead tissue.  Do not use peroxide. Apply topical antibacterial ointment plus/minus Vaseline.  Keep covered with bandage to avoid hitting and to keep antibacterial and place. Call if noting increasing redness, warmth, increasing pain or new thick yellow discharge.          Eliezer Lofts, MD

## 2022-09-27 NOTE — Assessment & Plan Note (Signed)
Acute, no current sign of bacterial superinfection.  Wound appears to be well-healing with granulation tissue. Wash wound daily with warm soapy water continue to debride any dead tissue.  Do not use peroxide. Apply topical antibacterial ointment plus/minus Vaseline.  Keep covered with bandage to avoid hitting and to keep antibacterial and place. Call if noting increasing redness, warmth, increasing pain or new thick yellow discharge.

## 2022-09-27 NOTE — Patient Instructions (Signed)
Wash wound daily with warm soapy water continue to debride any dead tissue.  Do not use peroxide. Apply topical antibacterial ointment plus/minus Vaseline.  Keep covered with bandage to avoid hitting and to keep antibacterial and place. Call if noting increasing redness, warmth, increasing pain or new thick yellow discharge.

## 2022-10-02 ENCOUNTER — Encounter (INDEPENDENT_AMBULATORY_CARE_PROVIDER_SITE_OTHER): Payer: Self-pay

## 2022-10-13 ENCOUNTER — Telehealth: Payer: Self-pay

## 2022-10-13 ENCOUNTER — Ambulatory Visit
Admission: EM | Admit: 2022-10-13 | Discharge: 2022-10-13 | Disposition: A | Discharge status: Home / Self Care | Payer: Medicare Other | Attending: Internal Medicine | Admitting: Internal Medicine

## 2022-10-13 DIAGNOSIS — R109 Unspecified abdominal pain: Secondary | ICD-10-CM | POA: Diagnosis not present

## 2022-10-13 LAB — POCT URINALYSIS DIP (MANUAL ENTRY)
Bilirubin, UA: NEGATIVE
Blood, UA: NEGATIVE
Glucose, UA: NEGATIVE mg/dL
Ketones, POC UA: NEGATIVE mg/dL
Leukocytes, UA: NEGATIVE
Nitrite, UA: NEGATIVE
Spec Grav, UA: >=1.03 — AB (ref 1.010–1.025)
Urobilinogen, UA: 1 E.U./dL
pH, UA: 5.5 (ref 5.0–8.0)

## 2022-10-13 NOTE — Telephone Encounter (Signed)
Access Nurse called back to state patient needs to be seen in the next 24 hours.  I let her know that we do not have available appointments today, nor does our [facility] location.  Access Nurse states she will have patient go to Urgent Care.

## 2022-10-13 NOTE — ED Triage Notes (Signed)
Pt. Presents to UC w/ c/o RLQ pain for the past 5 days.

## 2022-10-13 NOTE — Telephone Encounter (Signed)
Spoke with pt's wife and she stated that her husband is going to UC as soon as there friend gets there to pick him up.

## 2022-10-13 NOTE — Discharge Instructions (Addendum)
Your urine shows no signs of infection, but is very concentrated which means you are not hydrating enough We are unable to do abdominal xray since we dont have a table to lay you down, I am sorry for that.  Try 2 doses of Miralax tonight and see if it helps him empty out, if not do 3 doses tomorrow, If the pain does not resolve, then go to ER.

## 2022-10-13 NOTE — Telephone Encounter (Signed)
Patient states early last Sunday (09/07/2022), non-epilepsy seizures.  Patient states since that time, he has been having a small ache/pain on right side, but not severe. Patient states it may be easing up today. Patient states it seems like some of his organs aren't working properly.  Patient states he has not seen a provider.  I transferred call to Access Nurse.

## 2022-10-13 NOTE — ED Provider Notes (Signed)
David Atkins    CSN: [id number] Arrival date & time: 10/13/22  1514      History   Chief Complaint Chief Complaint  Patient presents with   Abdominal Pain    HPI David Atkins is a 88 y.o. male who presents with his care giver due to having R mid abdominal pain x 5 days. The pain is not getting any worse. He denies change in his appetite, fever, chills , N/V/D. Has hx of chronic constipation and used to take Miralax qd, but stopped it last week due to having diarrhea. Denies hx of renal stones. His last colonoscopy was 2016 which was normal, and I read the report of this myself.  States his last BM was today, but only passes small pellets. Has had an appendectomy.    Past Medical History:  Diagnosis Date   3-vessel coronary artery disease 2006   [place]   basal cell    Basal cell carcinoma 07/09/2012   BPH (benign prostatic hyperplasia)    GERD (gastroesophageal reflux disease)    Hyperlipidemia    Hypertension    Lumbar stenosis 12/03/13   MRI - lumbar pain with radiation down to LE   Myocardial infarction [facility]) 2006   OAB (overactive bladder)    Osteoporosis 2010   by DEXA at [place] apnea    Spondylolisthesis of lumbar region 11/23/13   MRI Lumbar spine    Patient Active Problem List   Diagnosis Date Noted   Laceration of left hand without foreign body 09/27/2022   OAB (overactive bladder)    Allergic rhinitis 03/29/2020   Allergic conjunctivitis 03/29/2020   Coughing 03/29/2020   Pharyngoesophageal dysphagia 07/07/2019   Concussion w loss of consciousness of unsp duration, subs 12/29/2018   Hospital discharge follow-up 12/29/2018   Recurrent syncope 12/16/2018   Dizziness and giddiness 10/01/2018   PAD (peripheral artery disease) ([place]) 10/01/2018   Cerebrovascular disease 02/07/2018   CAD (coronary artery disease), native coronary artery 11/04/2017   Vertigo 10/11/2017   Right foot pain 08/31/2017   Edema of right lower extremity 07/18/2017    RLS (restless legs syndrome) 07/18/2017   Hx of CABG 09/21/2016   IBS (irritable bowel syndrome) 11/03/2015   Generalized anxiety disorder 01/20/2015   Overweight 05/24/2014   Unspecified vitamin D deficiency 05/22/2014   OSA on CPAP 01/19/2014   Lumbar pain with radiation down left leg 12/01/2013   Chronic prostatitis 06/05/2013   Encounter for preventive health examination 01/13/2013   GERD (gastroesophageal reflux disease) 01/06/2013   Bilateral carotid artery stenosis 12/07/2012   Hyperlipidemia LDL goal <70 07/09/2012   Impaired glucose tolerance 07/09/2012   BPPV (benign paroxysmal positional vertigo) 07/09/2012   Osteoporosis    BPH (benign prostatic hyperplasia)    Hypertension     Past Surgical History:  Procedure Laterality Date   APPENDECTOMY  1951   COLONOSCOPY     COLONOSCOPY WITH PROPOFOL N/A 11/15/2015   Procedure: COLONOSCOPY WITH PROPOFOL;  Surgeon: Hulen Luster, MD;  Location: [facility] ENDOSCOPY;  Service: Gastroenterology;  Laterality: N/A;   CORONARY ARTERY BYPASS GRAFT  2006   3 vessel, s/p AMI   NASAL SEPTOPLASTY W/ TURBINOPLASTY  1981   TONSILLECTOMY         Home Medications    Prior to Admission medications   Medication Sig Start Date End Date Taking? Authorizing Provider  acetaminophen (TYLENOL) 500 MG tablet Take 500 mg by mouth at bedtime.     [provider]  amLODipine (NORVASC) 2.5 MG tablet TAKE 1 TABLET BY MOUTH  DAILY 03/29/22   [name], MD  aspirin 81 MG EC tablet Take 81 mg by mouth daily.     [provider]  Azelastine-Fluticasone 137-50 MCG/ACT SUSP Place 1 spray into both nostrils 2 (two) times daily as needed. 03/29/20   Bobbitt, [place] Muta, MD  Blood Pressure Monitoring (BLOOD PRESSURE MONITOR AUTOMAT) DEVI Use daily to check blood pressure 11/25/14   [name], MD  cholecalciferol (VITAMIN D) 25 MCG (1000 UNIT) tablet  02/24/20   [provider]  cyanocobalamin (,VITAMIN B-12,) 1000 MCG/ML  injection INJECT 1 ML (1,000 MCG TOTAL) INTO THE MUSCLE ONCE A WEEK. FOR 4 WEEKS, THEN MONTHLY THEREAFTER 12/06/20   [name], MD  diclofenac Sodium (VOLTAREN) 1 % GEL APPLY 4 GRAMS TOPICALLY 4  TIMES DAILY 04/01/21   [name], MD  erythromycin ophthalmic ointment SMARTSIG:0.25 Sparingly In Eye(s) Every Night 11/03/21   [provider]  escitalopram (LEXAPRO) 10 MG tablet TAKE 1 TABLET BY MOUTH EVERY DAY 11/16/21   [name], MD  finasteride (PROSCAR) 5 MG tablet TAKE 1 TABLET BY MOUTH  DAILY 08/02/22   [name], MD  lisinopril (ZESTRIL) 20 MG tablet TAKE 1 TABLET BY MOUTH  DAILY 03/29/22   [name], MD  metoprolol tartrate (LOPRESSOR) 25 MG tablet TAKE ONE-HALF TABLET BY MOUTH  TWICE DAILY 08/21/22   [name], MD  nitroGLYCERIN (NITROSTAT) 0.4 MG SL tablet Place 1 tablet (0.4 mg total) under the tongue every 5 (five) minutes as needed for chest pain. Maximum of 3 doses. 05/24/20   Minna Merritts, MD  omeprazole (PRILOSEC) 20 MG capsule TAKE 2 CAPSULES BY MOUTH  DAILY 05/03/22   [name], MD  oxybutynin (DITROPAN) 5 MG tablet TAKE 1 TABLET BY MOUTH  TWICE DAILY 03/29/22   [name], MD  polyethylene glycol powder (GLYCOLAX/MIRALAX) powder Take 17 g by mouth once. Dissolve 1 capful of power into any liquid and drink once daily Patient taking differently: Take 17 g by mouth at bedtime. Dissolve 1 capful of power into any liquid and drink once daily 11/01/15   Julianne Rice, MD  rOPINIRole (REQUIP) 0.25 MG tablet TAKE 1 TABLET BY MOUTH AT  BEDTIME 03/29/22   [name], MD  Simethicone Extra Strength 125 MG CAPS Take 125 mg by mouth as needed (gas).    [provider]  simvastatin (ZOCOR) 20 MG tablet TAKE 1 TABLET BY MOUTH AT  BEDTIME 03/29/22   [name], MD  Syringe/Needle, Disp, (SYRINGE 3CC/25GX1") 25G X 1" 3 ML MISC Use for b12 injections 09/02/20   [name], MD  tamsulosin (FLOMAX) 0.4 MG CAPS capsule TAKE 1  CAPSULE BY MOUTH  DAILY AFTER BREAKFAST 03/29/22   [name], MD    Family History Family History  Problem Relation Age of Onset   Hyperlipidemia Mother    Hypertension Mother    Hypertension Father     Social History Social History   Tobacco Use   Smoking status: Former    Types: Cigarettes    Quit date: 07/10/1963    Years since quitting: 59.3   Smokeless tobacco: Never  Vaping Use   Vaping Use: Never used  Substance Use Topics   Alcohol use: No   Drug use: No     Allergies   Sulfa antibiotics   Review of Systems Review of Systems  Constitutional:  Negative for appetite change, chills and fever.  Gastrointestinal:  Positive for abdominal pain. Negative for blood in stool, constipation, nausea and vomiting.  Genitourinary:  Negative for difficulty urinating, dysuria, flank pain and frequency.  Musculoskeletal:  Positive for gait problem. Negative for back pain.       Uses a walker to ambulate     Physical Exam Triage Vital Signs ED Triage Vitals  Enc Vitals Group     BP      Pulse      Resp      Temp      Temp src      SpO2      Weight      Height      Head Circumference      Peak Flow      Pain Score      Pain Loc      Pain Edu?      Excl. in [place]?    No data found.  Updated Vital Signs BP (!) 166/72 Comment: Provider aware and notified  Pulse 83   Temp 97.7 F (36.5 C)   Resp 15   SpO2 93%   Visual Acuity Right Eye Distance:   Left Eye Distance:   Bilateral Distance:    Right Eye Near:   Left Eye Near:    Bilateral Near:     Physical Exam Vitals and nursing note reviewed.  Constitutional:      General: He is not in acute distress.    Appearance: He is obese. He is not ill-appearing, toxic-appearing or diaphoretic.  Eyes:     Extraocular Movements: Extraocular movements intact.  Pulmonary:     Effort: Pulmonary effort is normal.  Abdominal:     General: Abdomen is protuberant. Bowel sounds are normal.     Palpations:  Abdomen is soft. There is no hepatomegaly or splenomegaly.     Tenderness: There is no right CVA tenderness, left CVA tenderness, guarding or rebound.     Comments: Has tenderness to the R of his umbilicus  Skin:    General: Skin is warm and dry.  Neurological:     Mental Status: He is alert and oriented to person, place, and time.  Psychiatric:        Mood and Affect: Mood normal.        Behavior: Behavior normal.      UC Treatments / Results  Labs (all labs ordered are listed, but only abnormal results are displayed) Labs Reviewed  POCT URINALYSIS DIP (MANUAL ENTRY) - Abnormal; Notable for the following components:      Result Value   Spec Grav, UA >=1.030 (*)    Protein Ur, POC trace (*)    All other components within normal limits    EKG   Radiology No results found.  Procedures Procedures (including critical care time)  Medications Ordered in UC Medications - No data to display  Initial Impression / Assessment and Plan / UC Course  I have reviewed the triage vital signs and the nursing notes.  Pertinent labs  results that were available during my care of the patient were reviewed by me and considered in my medical decision making (see chart for details).  Clinical Course as of 10/13/22 1740  Fri Oct 13, 2022  1655 POCT urinalysis dipstick(!) [SR]    Clinical Course User Index [SR] [place], Sunday Spill[place], [place]    Due to his chronic constipation, I suspect his discomfort may be due this, so I will  have try 2 doses of Miralax tonight and see if it helps him empty out, if not to do 3 doses tomorrow, If after emptying the pain is not better, then told to go to ER.  Final Clinical Impressions(s) / UC Diagnoses   Final diagnoses:  Abdominal pain, unspecified abdominal location     Discharge Instructions      Your urine shows no signs of infection, but is very concentrated which means you are not hydrating enough We are unable to do abdominal xray  since we dont have a table to lay you down, I am sorry for that.  Try 2 doses of Miralax tonight and see if it helps him empty out, if not do 3 doses tomorrow, If the pain does not resolve, then go to ER.      ED Prescriptions   None    PDMP not reviewed this encounter.   Shelby Mattocks, PA-C 10/13/22 1740

## 2022-10-21 NOTE — Progress Notes (Unsigned)
Home Cardiology Office Note  Date:  10/23/2022   ID:  David Atkins, DOB Aug 13, 1929, MRN [id number]  PCP:  David Mc, MD   Chief Complaint  Patient presents with   6-8 week follow up     Zio monitor was mailed back to the company 8-9 days ago, he is here to discuss the results, had fallen several times while he wore the monitor. Medications reviewed by the patient verbally.     HPI:  David Atkins is a very pleasant 88 year old gentleman with a history of  coronary artery disease, bypass surgery in 2006 at [facility] in [place],  mild carotid arterial disease by report,  hyperlipidemia,  small smoking history in the remote past,   aortic athero on CT 08/2018 who presents for routine followup of his coronary artery disease , syncope  Last seen in clinic August 2023  In follow-up today he presents with his daughter Long hx of "fainting", "darrhea comes with it unannounced"  Started 3-5 years ago Use to be 6 months apart Now more frequent Having GI accidents, lots of cleaning up of his diarrhea  Echo: normal EF, reviewed with him  Zio completed, had two episodes of diarrhea, First episode had Gi accident Second episode has a  spell in bed, diarrhea, may have had near syncope Like a faucet of stool, per family  Seen in urgent care September 12, 2022 for pain RLQ, comes and goes Pain for 2-3 weeks Taking miralex daily now, this seems to have changed his bowel movements  Bp elevated today, concerned about his abdominal pain Reports at home systolic pressure typically 073 systolic   chronic lower extremity edema, wears TED hose No regular walking or exercise program Significant gait instability, leg weakness Difficulty transferring from chair to table today  EKG personally reviewed by myself on todays visit shows normal sinus rhythm with rate 63 bpm, no significant ST or T-wave changes   Other past medical history Medical records reviewed, was  seen in the emergency room admitted to the hospital with syncope January 2020 --walking into his house today when he got dizzy and passed out.  He did fall and hit his head , memory difficulty related to the events  in the waiting room  in the ED, he nearly passed out again and appeared very pale.  Normal labs It does not appear that any medication changes were made at discharge Etiology unclear, possibly vasovagal   01/06/2014, he reported that he had general malaise, blood pressure checked showed systolic pressure greater than 200. He went to the emergency room for further evaluation. Workup there was negative including normal basic metabolic panel, negative cardiac enzymes. CT scan showed chronic microvascular changes in the deep white matter, and he was discharged home after medical management of his blood pressure. On a previous visit, his Son was concerned about various arguments he may be having with other family members at home and the stress this may be causing him.     PMH:   has a past medical history of 3-vessel coronary artery disease (2006), basal cell, Basal cell carcinoma (07/09/2012), BPH (benign prostatic hyperplasia), GERD (gastroesophageal reflux disease), Hyperlipidemia, Hypertension, Lumbar stenosis (12/03/13), Myocardial infarction (David Atkins) (2006), OAB (overactive bladder), Osteoporosis (2010), Sleep apnea, and Spondylolisthesis of lumbar region (11/23/13).  PSH:    Past Surgical History:  Procedure Laterality Date   APPENDECTOMY  1951   COLONOSCOPY     COLONOSCOPY WITH PROPOFOL N/A 11/15/2015   Procedure: COLONOSCOPY WITH PROPOFOL;  Surgeon: David Luster, MD;  Location: [facility] ENDOSCOPY;  Service: Gastroenterology;  Laterality: N/A;   CORONARY ARTERY BYPASS GRAFT  2006   3 vessel, s/p AMI   NASAL SEPTOPLASTY W/ TURBINOPLASTY  1981   TONSILLECTOMY      Current Outpatient Medications  Medication Sig Dispense Refill   acetaminophen (TYLENOL) 500 MG tablet Take 500 mg by mouth  at bedtime.      amLODipine (NORVASC) 2.5 MG tablet TAKE 1 TABLET BY MOUTH  DAILY 100 tablet 2   aspirin 81 MG EC tablet Take 81 mg by mouth daily.      Azelastine-Fluticasone 137-50 MCG/ACT SUSP Place 1 spray into both nostrils 2 (two) times daily as needed. 23 g 5   Blood Pressure Monitoring (BLOOD PRESSURE MONITOR AUTOMAT) DEVI Use daily to check blood pressure 1 Device 0   cholecalciferol (VITAMIN D) 25 MCG (1000 UNIT) tablet      cyanocobalamin (,VITAMIN B-12,) 1000 MCG/ML injection INJECT 1 ML (1,000 MCG TOTAL) INTO THE MUSCLE ONCE A WEEK. FOR 4 WEEKS, THEN MONTHLY THEREAFTER 12 mL 1   diclofenac Sodium (VOLTAREN) 1 % GEL APPLY 4 GRAMS TOPICALLY 4  TIMES DAILY 400 g 0   erythromycin ophthalmic ointment SMARTSIG:0.25 Sparingly In Eye(s) Every Night     escitalopram (LEXAPRO) 10 MG tablet TAKE 1 TABLET BY MOUTH EVERY DAY 90 tablet 1   finasteride (PROSCAR) 5 MG tablet TAKE 1 TABLET BY MOUTH  DAILY 100 tablet 2   lisinopril (ZESTRIL) 20 MG tablet TAKE 1 TABLET BY MOUTH  DAILY 100 tablet 2   metoprolol tartrate (LOPRESSOR) 25 MG tablet TAKE ONE-HALF TABLET BY MOUTH  TWICE DAILY 100 tablet 2   nitroGLYCERIN (NITROSTAT) 0.4 MG SL tablet Place 1 tablet (0.4 mg total) under the tongue every 5 (five) minutes as needed for chest pain. Maximum of 3 doses. 25 tablet 3   omeprazole (PRILOSEC) 20 MG capsule TAKE 2 CAPSULES BY MOUTH  DAILY 180 capsule 3   oxybutynin (DITROPAN) 5 MG tablet TAKE 1 TABLET BY MOUTH  TWICE DAILY 200 tablet 2   polyethylene glycol powder (GLYCOLAX/MIRALAX) powder Take 17 g by mouth once. Dissolve 1 capful of power into any liquid and drink once daily (Patient taking differently: Take 17 g by mouth at bedtime. Dissolve 1 capful of power into any liquid and drink once daily) 500 g 0   rOPINIRole (REQUIP) 0.25 MG tablet TAKE 1 TABLET BY MOUTH AT  BEDTIME 100 tablet 2   Simethicone Extra Strength 125 MG CAPS Take 125 mg by mouth as needed (gas).     simvastatin (ZOCOR) 20 MG tablet  TAKE 1 TABLET BY MOUTH AT  BEDTIME 100 tablet 2   Syringe/Needle, Disp, (SYRINGE 3CC/25GX1") 25G X 1" 3 ML MISC Use for b12 injections 50 each 0   tamsulosin (FLOMAX) 0.4 MG CAPS capsule TAKE 1 CAPSULE BY MOUTH  DAILY AFTER BREAKFAST 100 capsule 2   No current facility-administered medications for this visit.     Allergies:   Sulfa antibiotics   Social History:  The patient  reports that he quit smoking about 59 years ago. His smoking use included cigarettes. He has never used smokeless tobacco. He reports that he does not drink alcohol and does not use drugs.   Family History:   family history includes Hyperlipidemia in his mother; Hypertension in his father and mother.    Review of Systems: Review of Systems  Constitutional: Negative.   Respiratory: Negative.    Cardiovascular: Negative.  Gastrointestinal:  Positive for diarrhea.  Musculoskeletal:  Positive for back pain.  Neurological:  Positive for loss of consciousness.  Psychiatric/Behavioral: Negative.    All other systems reviewed and are negative.   PHYSICAL EXAM: VS:  BP (!) 180/76 (BP Location: Left Arm, Patient Position: Sitting, Cuff Size: Normal)   Pulse 63   Ht '5\' 10"'$  (1.778 m)   Wt 197 lb (89.4 kg)   SpO2 97%   BMI 28.27 kg/m  , BMI Body mass index is 28.27 kg/m. Constitutional:  oriented to person, place, and time. No distress.  HENT:  Head: Grossly normal Eyes:  no discharge. No scleral icterus.  Neck: No JVD, no carotid bruits  Cardiovascular: Regular rate and rhythm, no murmurs appreciated Pulmonary/Chest: Clear to auscultation bilaterally, no wheezes or rails Abdominal: Soft.  no distension.  no tenderness.  Musculoskeletal: Normal range of motion Neurological:  normal muscle tone. Coordination normal. No atrophy Skin: Skin warm and dry Psychiatric: normal affect, pleasant  Recent Labs: 08/22/2022: ALT 21; BUN 14; Creatinine, Ser 0.81; Hemoglobin 13.7; Magnesium 1.8; Platelets 203; Potassium 4.1;  Sodium 143; TSH 2.205    Lipid Panel Lab Results  Component Value Date   CHOL 125 11/17/2019   HDL 42.00 11/17/2019   LDLCALC 62 11/17/2019   TRIG 107.0 11/17/2019      Wt Readings from Last 3 Encounters:  10/23/22 197 lb (89.4 kg)  09/27/22 195 lb (88.5 kg)  08/22/22 196 lb 3.2 oz (89 kg)      ASSESSMENT AND PLAN:  3-vessel coronary artery disease - Plan: EKG 12-Lead Currently with no symptoms of angina. No further workup at this time. Continue current medication regimen.  Essential hypertension  Pressure elevated on arrival,  No medication changes made, he reports systolic pressure typically 140 on home check Given frequent episodes of near syncope, syncope with GI diarrhea spells, will be liberal with his blood pressure  Syncope/near syncope Likely vasovagal etiology associated with diarrhea spells Zio monitor reviewed, no significant arrhythmia noted apart from short runs narrow complex tachycardia, likely unrelated Recommend he stay hydrated, He will talk to Dr. Derrel Nip concerning his diarrhea spells  Lower extremity swelling/edema Likely component of dependent edema,  Continue compression hose Will avoid aggressive diuresis given his spells as above  Diarrhea Etiology unclear, comes on " unannounced", often having messes, associated near-syncope and syncope Discussed precautions to take Given right lower quadrant discomfort, may need imaging studies such as abdominal CT scan  Hyperlipidemia LDL goal <70 Prior lab work, numbers at goal  Bilateral carotid artery stenosis History of mild bilateral carotid disease Cholesterol numbers at goal .  Hx of CABG Stable,  no new testing  OSA On CPAP   Total encounter time more than 40 minutes  Greater than 50% was spent in counseling and coordination of care with the patient   Orders Placed This Encounter  Procedures   EKG 12-Lead     Signed, Esmond Plants, M.D., Ph.D. 10/23/2022  [place], [place]

## 2022-10-23 ENCOUNTER — Ambulatory Visit: Payer: Medicare Other | Attending: Cardiovascular Disease | Admitting: Cardiovascular Disease

## 2022-10-23 ENCOUNTER — Encounter: Payer: Self-pay | Admitting: Cardiovascular Disease

## 2022-10-23 VITALS — BP 170/80 | HR 63 | Ht 70.0 in | Wt 197.0 lb

## 2022-10-23 DIAGNOSIS — E785 Hyperlipidemia, unspecified: Secondary | ICD-10-CM

## 2022-10-23 DIAGNOSIS — I6523 Occlusion and stenosis of bilateral carotid arteries: Secondary | ICD-10-CM | POA: Diagnosis not present

## 2022-10-23 DIAGNOSIS — R600 Localized edema: Secondary | ICD-10-CM

## 2022-10-23 DIAGNOSIS — I2510 Atherosclerotic heart disease of native coronary artery without angina pectoris: Secondary | ICD-10-CM | POA: Diagnosis not present

## 2022-10-23 DIAGNOSIS — I10 Essential (primary) hypertension: Secondary | ICD-10-CM

## 2022-10-23 DIAGNOSIS — I739 Peripheral vascular disease, unspecified: Secondary | ICD-10-CM

## 2022-10-23 DIAGNOSIS — I25118 Atherosclerotic heart disease of native coronary artery with other forms of angina pectoris: Secondary | ICD-10-CM | POA: Diagnosis not present

## 2022-10-23 DIAGNOSIS — I679 Cerebrovascular disease, unspecified: Secondary | ICD-10-CM

## 2022-10-23 DIAGNOSIS — R55 Syncope and collapse: Secondary | ICD-10-CM | POA: Diagnosis not present

## 2022-10-23 NOTE — Patient Instructions (Addendum)
Medication Instructions:  No changes  If you need a refill on your cardiac medications before your next appointment, please call your pharmacy.    Lab work: No new labs needed   Testing/Procedures: No new testing needed   Follow-Up: At [facility], you and your health needs are our priority.  As part of our continuing mission to provide you with exceptional heart care, we have created designated Provider Care Teams.  These Care Teams include your primary Cardiologist (physician) and Advanced Practice Providers (APPs -  Physician Assistants and Nurse Practitioners) who all work together to provide you with the care you need, when you need it.  You will need a follow up appointment in 3 months  Providers on your designated Care Team:   Murray Hodgkins, NP Christell Faith, PA-C Cadence Kathlen Mody, [place]  COVID-19 Vaccine Information can be found at: [email or web address] For questions related to vaccine distribution or appointments, please email [email or web address] or call [phone].

## 2022-10-24 ENCOUNTER — Encounter: Payer: Self-pay | Admitting: Internal Medicine

## 2022-10-24 ENCOUNTER — Telehealth: Payer: Self-pay | Admitting: Internal Medicine

## 2022-10-24 ENCOUNTER — Ambulatory Visit (INDEPENDENT_AMBULATORY_CARE_PROVIDER_SITE_OTHER): Payer: Medicare Other | Admitting: Internal Medicine

## 2022-10-24 ENCOUNTER — Ambulatory Visit (INDEPENDENT_AMBULATORY_CARE_PROVIDER_SITE_OTHER): Payer: Medicare Other

## 2022-10-24 VITALS — BP 140/76 | HR 68 | Temp 97.5°F | Ht 70.0 in | Wt 196.4 lb

## 2022-10-24 DIAGNOSIS — R1031 Right lower quadrant pain: Secondary | ICD-10-CM | POA: Diagnosis not present

## 2022-10-24 DIAGNOSIS — R159 Full incontinence of feces: Secondary | ICD-10-CM | POA: Insufficient documentation

## 2022-10-24 DIAGNOSIS — K5909 Other constipation: Secondary | ICD-10-CM

## 2022-10-24 DIAGNOSIS — I679 Cerebrovascular disease, unspecified: Secondary | ICD-10-CM | POA: Diagnosis not present

## 2022-10-24 DIAGNOSIS — K5900 Constipation, unspecified: Secondary | ICD-10-CM | POA: Diagnosis not present

## 2022-10-24 DIAGNOSIS — R55 Syncope and collapse: Secondary | ICD-10-CM | POA: Diagnosis not present

## 2022-10-24 DIAGNOSIS — R569 Unspecified convulsions: Secondary | ICD-10-CM

## 2022-10-24 LAB — COMPREHENSIVE METABOLIC PANEL
ALT: 21 U/L (ref 0–53)
AST: 21 U/L (ref 0–37)
Albumin: 3.9 g/dL (ref 3.5–5.2)
Alkaline Phosphatase: 63 U/L (ref 39–117)
BUN: 14 mg/dL (ref 6–23)
CO2: 30 mEq/L (ref 19–32)
Calcium: 9 mg/dL (ref 8.4–10.5)
Chloride: 106 mEq/L (ref 96–112)
Creatinine, Ser: 0.81 mg/dL (ref 0.40–1.50)
GFR: 77.1 mL/min (ref >60.00)
Glucose, Bld: 82 mg/dL (ref 70–99)
Potassium: 4.1 mEq/L (ref 3.5–5.1)
Sodium: 142 mEq/L (ref 135–145)
Total Bilirubin: 0.6 mg/dL (ref 0.2–1.2)
Total Protein: 6.1 g/dL (ref 6.0–8.3)

## 2022-10-24 LAB — MAGNESIUM: Magnesium: 1.8 mg/dL (ref 1.5–2.5)

## 2022-10-24 LAB — TSH: TSH: 1.74 u[IU]/mL (ref 0.35–5.50)

## 2022-10-24 NOTE — Assessment & Plan Note (Signed)
Cardiogenic causes ruled out as the last Episodes (2) in the last month occurred during wearing of the Zio monitor.  Both were preceded by a feeling of euphoria and relaxation and accompanied by fecal incontinence.  Neurologic source potentially:question if he is having seizures.   MRI brain ordered along with referral to dr Manuella Ghazi .  He has given up driving.

## 2022-10-24 NOTE — Telephone Encounter (Signed)
Lft msg with pt wife to call ofc to sch CT and MRI. thanks

## 2022-10-24 NOTE — Assessment & Plan Note (Signed)
Present for the last several months. Temporarily relieved with tylenol.  Given his recurrent diarrhea, will image intestines with CT abd and pelvis

## 2022-10-24 NOTE — Progress Notes (Signed)
Subjective:  Patient ID: David Atkins, male    DOB: August 08, 1929  Age: 88 y.o. MRN: [id number]  CC: The primary encounter diagnosis was Chronic constipation. Diagnoses of Right lower quadrant abdominal pain, New onset seizure ([place]), Recurrent syncope, and Full incontinence of feces were also pertinent to this visit.   HPI [name] presents for follow  up recurrent episodes of diarrhea accompanied by syncope.  Chief Complaint  Patient presents with   Follow-up    Follow up from cardiology    C  He has a history of Chronic constipation,  has  been taking miralax nightly for years. Per daughter he has been forgetting to take it which causes constipations.  Stools have been very soft . Last BM was this morning , small but per daughter he had a messy large one last night which was loose.   He has been somewhat scattered and distracted and convinced that his current health issues portend his death; he has been trying to get his affairs in order/ (per daugher)   2 syncopal events during the 2 week period he was wearing the zio monitor.  Cardiology evaluation with zio monitor for arrhythmias  was non diagnostic per [place].   Both episodes have been accompanied  fecal incontinence. Prodromal feeling of euphoria/relaxation , no cramping of abdomen .    he had a syncopal event 3 years ago with loss of bladder control.  Has been having a mild right sided abd pain for the last 2-3 weeks that has been intermittent  never stronger than 3/10.  Uses tylenol at night which temporarily relieves pain completely. . The pain is relieved  by supine and side position . It is aggravated by sitting on his couch and by standing .    Reviewed prior CT done in 2020:  Severe aorrto iliac atherosclerotic disease,  gallstones, nonobstructive ;   CKD with cortical thinning noted . S/p appy.  Outpatient Medications Prior to Visit  Medication Sig Dispense Refill   acetaminophen (TYLENOL) 500 MG tablet Take 500 mg  by mouth at bedtime.      amLODipine (NORVASC) 2.5 MG tablet TAKE 1 TABLET BY MOUTH  DAILY 100 tablet 2   aspirin 81 MG EC tablet Take 81 mg by mouth daily.      Azelastine-Fluticasone 137-50 MCG/ACT SUSP Place 1 spray into both nostrils 2 (two) times daily as needed. 23 g 5   Blood Pressure Monitoring (BLOOD PRESSURE MONITOR AUTOMAT) DEVI Use daily to check blood pressure 1 Device 0   cholecalciferol (VITAMIN D) 25 MCG (1000 UNIT) tablet      cyanocobalamin (,VITAMIN B-12,) 1000 MCG/ML injection INJECT 1 ML (1,000 MCG TOTAL) INTO THE MUSCLE ONCE A WEEK. FOR 4 WEEKS, THEN MONTHLY THEREAFTER 12 mL 1   diclofenac Sodium (VOLTAREN) 1 % GEL APPLY 4 GRAMS TOPICALLY 4  TIMES DAILY 400 g 0   erythromycin ophthalmic ointment SMARTSIG:0.25 Sparingly In Eye(s) Every Night     finasteride (PROSCAR) 5 MG tablet TAKE 1 TABLET BY MOUTH  DAILY 100 tablet 2   lisinopril (ZESTRIL) 20 MG tablet TAKE 1 TABLET BY MOUTH  DAILY 100 tablet 2   metoprolol tartrate (LOPRESSOR) 25 MG tablet TAKE ONE-HALF TABLET BY MOUTH  TWICE DAILY 100 tablet 2   nitroGLYCERIN (NITROSTAT) 0.4 MG SL tablet Place 1 tablet (0.4 mg total) under the tongue every 5 (five) minutes as needed for chest pain. Maximum of 3 doses. 25 tablet 3   omeprazole (PRILOSEC) 20 MG capsule  TAKE 2 CAPSULES BY MOUTH  DAILY 180 capsule 3   oxybutynin (DITROPAN) 5 MG tablet TAKE 1 TABLET BY MOUTH  TWICE DAILY 200 tablet 2   polyethylene glycol powder (GLYCOLAX/MIRALAX) powder Take 17 g by mouth once. Dissolve 1 capful of power into any liquid and drink once daily (Patient taking differently: Take 17 g by mouth at bedtime. Dissolve 1 capful of power into any liquid and drink once daily) 500 g 0   rOPINIRole (REQUIP) 0.25 MG tablet TAKE 1 TABLET BY MOUTH AT  BEDTIME 100 tablet 2   Simethicone Extra Strength 125 MG CAPS Take 125 mg by mouth as needed (gas).     simvastatin (ZOCOR) 20 MG tablet TAKE 1 TABLET BY MOUTH AT  BEDTIME 100 tablet 2   Syringe/Needle, Disp,  (SYRINGE 3CC/25GX1") 25G X 1" 3 ML MISC Use for b12 injections 50 each 0   tamsulosin (FLOMAX) 0.4 MG CAPS capsule TAKE 1 CAPSULE BY MOUTH  DAILY AFTER BREAKFAST 100 capsule 2   escitalopram (LEXAPRO) 10 MG tablet TAKE 1 TABLET BY MOUTH EVERY DAY (Patient not taking: Reported on 10/24/2022) 90 tablet 1   No facility-administered medications prior to visit.    Review of Systems;  Patient denies headache, fevers, malaise, unintentional weight loss, skin rash, eye pain, sinus congestion and sinus pain, sore throat, dysphagia,  hemoptysis , cough, dyspnea, wheezing, chest pain, palpitations, orthopnea, edema, abdominal pain, nausea, melena, diarrhea, constipation, flank pain, dysuria, hematuria, urinary  Frequency, nocturia, numbness, tingling, seizures,  Focal weakness, Loss of consciousness,  Tremor, insomnia, depression, anxiety, and suicidal ideation.      Objective:  BP (!) 140/76 (BP Location: Left Arm, Patient Position: Sitting, Cuff Size: Normal)   Pulse 68   Temp (!) 97.5 F (36.4 C) (Oral)   Ht '5\' 10"'$  (1.778 m)   Wt 196 lb 6.4 oz (89.1 kg)   SpO2 97%   BMI 28.18 kg/m   BP Readings from Last 3 Encounters:  10/24/22 (!) 140/76  10/23/22 (!) 170/80  10/13/22 (!) 166/72    Wt Readings from Last 3 Encounters:  10/24/22 196 lb 6.4 oz (89.1 kg)  10/23/22 197 lb (89.4 kg)  09/27/22 195 lb (88.5 kg)    General appearance: alert, cooperative and appears stated age Ears: normal TM's and external ear canals both ears Throat: lips, mucosa, and tongue normal; teeth and gums normal Neck: no adenopathy, no carotid bruit, supple, symmetrical, trachea midline and thyroid not enlarged, symmetric, no tenderness/mass/nodules Back: symmetric, no curvature. ROM normal. No CVA tenderness. Lungs: clear to auscultation bilaterally Heart: regular rate and rhythm, S1, S2 normal, no murmur, click, rub or gallop Abdomen: soft, non-tender; bowel sounds normal; no masses,  no organomegaly Pulses:  2+ and symmetric Skin: Skin color, texture, turgor normal. No rashes or lesions Lymph nodes: Cervical, supraclavicular, and axillary nodes normal. Neuro:  awake and interactive with normal mood and affect. Higher cortical functions are normal. Speech is clear without word-finding difficulty or dysarthria. Extraocular movements are intact. Visual fields of both eyes are grossly intact. Sensation to light touch is grossly intact bilaterally of upper and lower extremities. Motor examination shows 4+/5 symmetric hand grip and upper extremity and 5/5 lower extremity strength. There is no pronation or drift. Gait is non-ataxic   Lab Results  Component Value Date   HGBA1C 5.7 11/17/2019   HGBA1C 5.7 02/08/2018   HGBA1C 5.6 11/02/2017    Lab Results  Component Value Date   CREATININE 0.81 08/22/2022   CREATININE  0.78 08/31/2021   CREATININE 0.88 03/14/2021    Lab Results  Component Value Date   WBC 7.7 08/22/2022   HGB 13.7 08/22/2022   HCT 42.1 08/22/2022   PLT 203 08/22/2022   GLUCOSE 78 08/22/2022   CHOL 125 11/17/2019   TRIG 107.0 11/17/2019   HDL 42.00 11/17/2019   LDLDIRECT CANCELED 11/02/2017   LDLCALC 62 11/17/2019   ALT 21 08/22/2022   AST 25 08/22/2022   NA 143 08/22/2022   K 4.1 08/22/2022   CL 108 08/22/2022   CREATININE 0.81 08/22/2022   BUN 14 08/22/2022   CO2 28 08/22/2022   TSH 2.205 08/22/2022   HGBA1C 5.7 11/17/2019   MICROALBUR <0.7 02/08/2018    No results found.  Assessment & Plan:   Problem List Items Addressed This Visit     Full incontinence of feces    Occurring during syncopal events , but also having explosive BMs (per daugher) which may be due to use of miralax daily.  Advised to use magnesium and benefiber daily .plain abd film ordered to eval stool burdern      Relevant Orders   Ambulatory referral to Neurology   Recurrent syncope    Cardiogenic causes ruled out as the last Episodes (2) in the last month occurred during wearing of the Zio  monitor.  Both were preceded by a feeling of euphoria and relaxation and accompanied by fecal incontinence.  Neurologic source potentially:question if he is having seizures.   MRI brain ordered along with referral to dr Manuella Ghazi .  He has given up driving.      Relevant Orders   Ambulatory referral to Neurology   Right lower quadrant abdominal pain    Present for the last several months. Temporarily relieved with tylenol.  Given his recurrent diarrhea, will image intestines with CT abd and pelvis      Relevant Orders   CT Abdomen Pelvis W Contrast   Other Visit Diagnoses     Chronic constipation    -  Primary   Relevant Orders   DG Abd 1 View   Comprehensive metabolic panel   TSH   Magnesium   New onset seizure ([place])       Relevant Orders   MR Brain Wo Contrast   Ambulatory referral to Neurology       I spent a total of  37 minutes with this patient in a face to face visit on the date of this encounter reviewing the last office visit with me ,   most recent visit with cardiology ,    recent urgetnt care visit including labs and imaging studies ,   last CT in 2020, lats neurology visit in 2019, and post visit ordering of testing and therapeutics.    Follow-up: Return in about 4 weeks (around 11/21/2022).   [name], MD

## 2022-10-24 NOTE — Patient Instructions (Addendum)
The miralax may be too strong for you to use on a daily basis.  Try using Benefiber twice daily instead , , along with 400 mg magnesium once daily.  use miralax only if you have not moved bowels in 48 hours  CT abd and pelvis,  MRI Brain ,  nand eurology referrals are all in progress

## 2022-10-24 NOTE — Assessment & Plan Note (Signed)
Occurring during syncopal events , but also having explosive BMs (per daugher) which may be due to use of miralax daily.  Advised to use magnesium and benefiber daily .plain abd film ordered to eval stool burdern

## 2022-11-02 DIAGNOSIS — I471 Supraventricular tachycardia, unspecified: Secondary | ICD-10-CM

## 2022-11-02 HISTORY — DX: Supraventricular tachycardia, unspecified: I47.10

## 2022-11-08 ENCOUNTER — Ambulatory Visit
Admission: RE | Admit: 2022-11-08 | Discharge: 2022-11-08 | Disposition: A | Discharge status: Home / Self Care | Payer: Medicare Other | Source: Ambulatory Visit | Attending: Internal Medicine | Admitting: Internal Medicine

## 2022-11-08 DIAGNOSIS — R569 Unspecified convulsions: Secondary | ICD-10-CM

## 2022-11-08 DIAGNOSIS — R1031 Right lower quadrant pain: Secondary | ICD-10-CM | POA: Insufficient documentation

## 2022-11-08 DIAGNOSIS — R109 Unspecified abdominal pain: Secondary | ICD-10-CM | POA: Diagnosis not present

## 2022-11-08 DIAGNOSIS — I700 Atherosclerosis of aorta: Secondary | ICD-10-CM | POA: Diagnosis not present

## 2022-11-08 DIAGNOSIS — G319 Degenerative disease of nervous system, unspecified: Secondary | ICD-10-CM | POA: Diagnosis not present

## 2022-11-08 MED ORDER — IOHEXOL 300 MG/ML  SOLN
100.0000 mL | Freq: Once | INTRAMUSCULAR | Status: AC | PRN
  Administered 2022-11-08: 100 mL via INTRAVENOUS

## 2022-11-09 DIAGNOSIS — G4733 Obstructive sleep apnea (adult) (pediatric): Secondary | ICD-10-CM | POA: Diagnosis not present

## 2022-11-09 DIAGNOSIS — I10 Essential (primary) hypertension: Secondary | ICD-10-CM | POA: Diagnosis not present

## 2022-11-09 MED ORDER — ATORVASTATIN CALCIUM 20 MG PO TABS: 20.0000 mg | ORAL_TABLET | Freq: Every day | ORAL | 3 refills | Status: DC

## 2022-11-09 NOTE — Addendum Note (Signed)
Addended by: [name] on: 11/09/2022 04:35 PM   Modules accepted: Orders

## 2022-11-09 NOTE — Assessment & Plan Note (Signed)
Progression noted on MRI brain.  Recommending change from simvastatin to atorvastatin

## 2022-11-10 ENCOUNTER — Encounter: Payer: Self-pay | Admitting: Internal Medicine

## 2022-11-10 DIAGNOSIS — K8020 Calculus of gallbladder without cholecystitis without obstruction: Secondary | ICD-10-CM

## 2022-11-10 DIAGNOSIS — R1031 Right lower quadrant pain: Secondary | ICD-10-CM

## 2022-11-16 ENCOUNTER — Other Ambulatory Visit: Payer: Medicare Other

## 2022-11-17 ENCOUNTER — Ambulatory Visit
Admission: RE | Admit: 2022-11-17 | Discharge: 2022-11-17 | Disposition: A | Discharge status: Home / Self Care | Payer: Medicare Other | Source: Ambulatory Visit | Attending: Internal Medicine | Admitting: Internal Medicine

## 2022-11-17 DIAGNOSIS — K8020 Calculus of gallbladder without cholecystitis without obstruction: Secondary | ICD-10-CM

## 2022-11-17 DIAGNOSIS — R1031 Right lower quadrant pain: Secondary | ICD-10-CM | POA: Diagnosis not present

## 2022-11-17 DIAGNOSIS — K760 Fatty (change of) liver, not elsewhere classified: Secondary | ICD-10-CM | POA: Diagnosis not present

## 2022-11-17 DIAGNOSIS — K769 Liver disease, unspecified: Secondary | ICD-10-CM | POA: Diagnosis not present

## 2022-11-17 DIAGNOSIS — R109 Unspecified abdominal pain: Secondary | ICD-10-CM | POA: Diagnosis not present

## 2022-11-20 ENCOUNTER — Telehealth: Payer: Self-pay | Admitting: Internal Medicine

## 2022-11-20 NOTE — Telephone Encounter (Signed)
Pt would like to be called in regards to his medication.

## 2022-11-21 NOTE — Telephone Encounter (Signed)
Attempted to call no answer no voicemail.

## 2022-11-22 ENCOUNTER — Ambulatory Visit: Payer: Medicare Other | Admitting: Internal Medicine

## 2022-11-23 ENCOUNTER — Other Ambulatory Visit (INDEPENDENT_AMBULATORY_CARE_PROVIDER_SITE_OTHER): Payer: Self-pay | Admitting: Vascular Surgery

## 2022-11-23 DIAGNOSIS — I739 Peripheral vascular disease, unspecified: Secondary | ICD-10-CM

## 2022-11-23 DIAGNOSIS — I6523 Occlusion and stenosis of bilateral carotid arteries: Secondary | ICD-10-CM

## 2022-11-24 ENCOUNTER — Ambulatory Visit (INDEPENDENT_AMBULATORY_CARE_PROVIDER_SITE_OTHER): Payer: Medicare Other | Admitting: Nurse Practitioner

## 2022-11-24 ENCOUNTER — Ambulatory Visit (INDEPENDENT_AMBULATORY_CARE_PROVIDER_SITE_OTHER): Payer: Medicare Other

## 2022-11-24 ENCOUNTER — Encounter (INDEPENDENT_AMBULATORY_CARE_PROVIDER_SITE_OTHER): Payer: Self-pay | Admitting: Nurse Practitioner

## 2022-11-24 VITALS — BP 153/76 | HR 73 | Resp 17 | Ht 70.0 in | Wt 193.0 lb

## 2022-11-24 DIAGNOSIS — I739 Peripheral vascular disease, unspecified: Secondary | ICD-10-CM

## 2022-11-24 DIAGNOSIS — I6523 Occlusion and stenosis of bilateral carotid arteries: Secondary | ICD-10-CM | POA: Diagnosis not present

## 2022-11-24 DIAGNOSIS — I10 Essential (primary) hypertension: Secondary | ICD-10-CM

## 2022-11-24 DIAGNOSIS — L299 Pruritus, unspecified: Secondary | ICD-10-CM | POA: Diagnosis not present

## 2022-11-24 DIAGNOSIS — H6123 Impacted cerumen, bilateral: Secondary | ICD-10-CM | POA: Diagnosis not present

## 2022-11-30 ENCOUNTER — Ambulatory Visit: Payer: Medicare Other | Admitting: Podiatry

## 2022-11-30 ENCOUNTER — Encounter: Payer: Self-pay | Admitting: Podiatry

## 2022-11-30 DIAGNOSIS — M79674 Pain in right toe(s): Secondary | ICD-10-CM

## 2022-11-30 DIAGNOSIS — R7302 Impaired glucose tolerance (oral): Secondary | ICD-10-CM | POA: Diagnosis not present

## 2022-11-30 DIAGNOSIS — I739 Peripheral vascular disease, unspecified: Secondary | ICD-10-CM | POA: Diagnosis not present

## 2022-11-30 DIAGNOSIS — M79675 Pain in left toe(s): Secondary | ICD-10-CM

## 2022-11-30 DIAGNOSIS — B351 Tinea unguium: Secondary | ICD-10-CM | POA: Diagnosis not present

## 2022-11-30 NOTE — Progress Notes (Signed)
This patient returns to my office for at risk foot care.  This patient requires this care by a professional since this patient will be at risk due to having  PAD.  This patient is unable to cut nails himself since the patient cannot reach his nails.These nails are painful walking and wearing shoes.  This patient presents for at risk foot care today.  General Appearance  Alert, conversant and in no acute stress.  Vascular  Dorsalis pedis and posterior tibial  pulses are weakly  palpable  bilaterally.  Capillary return is within normal limits  bilaterally. Temperature is within normal limits  bilaterally.  Neurologic  Senn-Weinstein monofilament wire test within normal limits  bilaterally. Muscle power within normal limits bilaterally.  Nails Thick disfigured discolored nails with subungual debris  from hallux to fifth toes bilaterally. No evidence of bacterial infection or drainage bilaterally.  Orthopedic  No limitations of motion  feet .  No crepitus or effusions noted.  No bony pathology or digital deformities noted.  Skin  normotropic skin with no porokeratosis noted bilaterally.  No signs of infections or ulcers noted.     Onychomycosis  Pain in right toes  Pain in left toes  Consent was obtained for treatment procedures.   Mechanical debridement of nails 1-5  bilaterally performed with a nail nipper.  Filed with dremel without incident.    Return office visit   3 months                   Told patient to return for periodic foot care and evaluation due to potential at risk complications.   Gardiner Barefoot DPM

## 2022-12-07 ENCOUNTER — Ambulatory Visit (INDEPENDENT_AMBULATORY_CARE_PROVIDER_SITE_OTHER): Payer: Medicare Other | Admitting: Internal Medicine

## 2022-12-07 ENCOUNTER — Encounter: Payer: Self-pay | Admitting: Internal Medicine

## 2022-12-07 VITALS — BP 142/74 | HR 69 | Ht 70.0 in | Wt 192.6 lb

## 2022-12-07 DIAGNOSIS — I10 Essential (primary) hypertension: Secondary | ICD-10-CM | POA: Diagnosis not present

## 2022-12-07 DIAGNOSIS — I708 Atherosclerosis of other arteries: Secondary | ICD-10-CM

## 2022-12-07 DIAGNOSIS — I700 Atherosclerosis of aorta: Secondary | ICD-10-CM | POA: Diagnosis not present

## 2022-12-07 DIAGNOSIS — K8020 Calculus of gallbladder without cholecystitis without obstruction: Secondary | ICD-10-CM | POA: Insufficient documentation

## 2022-12-07 DIAGNOSIS — F411 Generalized anxiety disorder: Secondary | ICD-10-CM

## 2022-12-07 DIAGNOSIS — E785 Hyperlipidemia, unspecified: Secondary | ICD-10-CM | POA: Diagnosis not present

## 2022-12-07 DIAGNOSIS — R569 Unspecified convulsions: Secondary | ICD-10-CM

## 2022-12-07 DIAGNOSIS — Z9181 History of falling: Secondary | ICD-10-CM | POA: Diagnosis not present

## 2022-12-07 DIAGNOSIS — R42 Dizziness and giddiness: Secondary | ICD-10-CM

## 2022-12-07 DIAGNOSIS — K8070 Calculus of gallbladder and bile duct without cholecystitis without obstruction: Secondary | ICD-10-CM

## 2022-12-07 DIAGNOSIS — R159 Full incontinence of feces: Secondary | ICD-10-CM

## 2022-12-07 DIAGNOSIS — R55 Syncope and collapse: Secondary | ICD-10-CM

## 2022-12-07 DIAGNOSIS — I679 Cerebrovascular disease, unspecified: Secondary | ICD-10-CM | POA: Diagnosis not present

## 2022-12-07 DIAGNOSIS — I739 Peripheral vascular disease, unspecified: Secondary | ICD-10-CM

## 2022-12-07 DIAGNOSIS — R2689 Other abnormalities of gait and mobility: Secondary | ICD-10-CM

## 2022-12-07 LAB — COMPREHENSIVE METABOLIC PANEL
ALT: 20 U/L (ref 0–53)
AST: 24 U/L (ref 0–37)
Albumin: 3.9 g/dL (ref 3.5–5.2)
Alkaline Phosphatase: 58 U/L (ref 39–117)
BUN: 13 mg/dL (ref 6–23)
CO2: 31 mEq/L (ref 19–32)
Calcium: 9.1 mg/dL (ref 8.4–10.5)
Chloride: 105 mEq/L (ref 96–112)
Creatinine, Ser: 0.78 mg/dL (ref 0.40–1.50)
GFR: 77.92 mL/min (ref >60.00)
Glucose, Bld: 80 mg/dL (ref 70–99)
Potassium: 3.8 mEq/L (ref 3.5–5.1)
Sodium: 142 mEq/L (ref 135–145)
Total Bilirubin: 0.5 mg/dL (ref 0.2–1.2)
Total Protein: 6 g/dL (ref 6.0–8.3)

## 2022-12-07 LAB — LIPID PANEL
Cholesterol: 98 mg/dL (ref 0–200)
HDL: 39.5 mg/dL (ref >39.00)
LDL Cholesterol: 40 mg/dL (ref 0–99)
NonHDL: 58.37
Total CHOL/HDL Ratio: 2
Triglycerides: 92 mg/dL (ref 0.0–149.0)
VLDL: 18.4 mg/dL (ref 0.0–40.0)

## 2022-12-07 NOTE — Assessment & Plan Note (Signed)
With old lacunar infarcts noted on 2019 MRI brain. Unchanged on 2023 brain MRI>. Continue current regimen, avoiding overcorrection given episodes of syncope that may be vasovagally induced.

## 2022-12-07 NOTE — Progress Notes (Signed)
Subjective:  Patient ID: David Atkins, male    DOB: 18-Nov-1929  Age: 89 y.o. MRN: [id number]  CC: The primary encounter diagnosis was Poor balance. Diagnoses of Hyperlipidemia LDL goal <70, Dizziness and giddiness, History of fall, Cerebrovascular disease, Full incontinence of feces, Seizure ([place]), Calculus of gallbladder and bile duct without cholecystitis or obstruction, Generalized anxiety disorder, Primary hypertension, PAD (peripheral artery disease) ([place]), and Atherosclerosis of aortic bifurcation and common iliac arteries ([place]) were also pertinent to this visit.   HPI David Atkins presents for follow up on multiple chronic issues .  He is accompanied by his daughter Pearla Dubonnet Complaint  Patient presents with   Medical Management of Chronic Issues   1) right sided abdominal pain relieved with tylenol aggravated by prolonged sitting.  Workup with CT abd was done due to recurrent noncardiogenic syncope,  bowel incontinence and chronic constipation    At last visit he reported having a mild right sided abd pain for the last 2-3 weeks that had been intermittent, and   never stronger than 3/10.  Using tylenol at night which temporarily relieves pain completely. . The pain is relieved  by supine and side position . It is aggravated by sitting on his couch and by standing  I his workshop for prolonged periods .  CT was done and noted possible fatty liver,  gallstones in the GB neck without evidence of cholecystitis.  No bowel dilation or itis.  an U/S done:  no CBD ductal dilation , no GB wall thickening,  no Murphy's sign   noted,   LFTS normal.   No other  changes from prior CT done in 2020:  Severe aorto iliac atherosclerotic disease,  gallstones, nonobstructive ;   CKD with cortical thinning noted . S/p appy.  2) new onset seizure vs recurrent syncope.  Occurring in the setting of recurrent episodes of constipation  altnerating with "blow outs".  Per daughter , most  occurred at night  or early in the morning .  Have not occurred since last visit  .  Repeated MRI brain: chronic lacunar infarcts,  generalized atrophy and chronic small vessel ischemic changes, slightly progressed since 2019 MRI .  ZIO Monitor in November was worn for 14 days and was non diagnostic .   Awaiting evaluation by neurology (referral to [facility] done nov 21 . Has not been contacted  3) poor balance  . Last fall occurred in the fall.  Uses a rolling walker.  Fear of falling backward. .  Dissuaded against getting a scooter. Discussed and received ok to order Home PT for balance and stability ordered  3) hand rash;  newly discovered Allergy to tomato products.  Still having itching of the dorsal surface of  right hand and fingers.  Using topical HC cream.  Recalls remote history of fungal rash  of right hand that was "never resolved"     4) Anxiety:  daughter notes excessive worrying about  the declining health of relatives,  past "mistakes"  Outpatient Medications Prior to Visit  Medication Sig Dispense Refill   acetaminophen (TYLENOL) 500 MG tablet Take 500 mg by mouth at bedtime.      amLODipine (NORVASC) 2.5 MG tablet TAKE 1 TABLET BY MOUTH  DAILY 100 tablet 2   aspirin 81 MG EC tablet Take 81 mg by mouth daily.      atorvastatin (LIPITOR) 20 MG tablet Take 1 tablet (20 mg total) by mouth daily. 90 tablet 3   Azelastine-Fluticasone 137-50  MCG/ACT SUSP Place 1 spray into both nostrils 2 (two) times daily as needed. 23 g 5   Blood Pressure Monitoring (BLOOD PRESSURE MONITOR AUTOMAT) DEVI Use daily to check blood pressure 1 Device 0   cholecalciferol (VITAMIN D) 25 MCG (1000 UNIT) tablet      cyanocobalamin (,VITAMIN B-12,) 1000 MCG/ML injection INJECT 1 ML (1,000 MCG TOTAL) INTO THE MUSCLE ONCE A WEEK. FOR 4 WEEKS, THEN MONTHLY THEREAFTER 12 mL 1   diclofenac Sodium (VOLTAREN) 1 % GEL APPLY 4 GRAMS TOPICALLY 4  TIMES DAILY 400 g 0   erythromycin ophthalmic ointment SMARTSIG:0.25 Sparingly In Eye(s) Every  Night     finasteride (PROSCAR) 5 MG tablet TAKE 1 TABLET BY MOUTH  DAILY 100 tablet 2   lisinopril (ZESTRIL) 20 MG tablet TAKE 1 TABLET BY MOUTH  DAILY 100 tablet 2   metoprolol tartrate (LOPRESSOR) 25 MG tablet TAKE ONE-HALF TABLET BY MOUTH  TWICE DAILY 100 tablet 2   nitroGLYCERIN (NITROSTAT) 0.4 MG SL tablet Place 1 tablet (0.4 mg total) under the tongue every 5 (five) minutes as needed for chest pain. Maximum of 3 doses. 25 tablet 3   omeprazole (PRILOSEC) 20 MG capsule TAKE 2 CAPSULES BY MOUTH  DAILY 180 capsule 3   oxybutynin (DITROPAN) 5 MG tablet TAKE 1 TABLET BY MOUTH  TWICE DAILY 200 tablet 2   polyethylene glycol powder (GLYCOLAX/MIRALAX) powder Take 17 g by mouth once. Dissolve 1 capful of power into any liquid and drink once daily (Patient taking differently: Take 17 g by mouth at bedtime. Dissolve 1 capful of power into any liquid and drink once daily) 500 g 0   rOPINIRole (REQUIP) 0.25 MG tablet TAKE 1 TABLET BY MOUTH AT  BEDTIME 100 tablet 2   Simethicone Extra Strength 125 MG CAPS Take 125 mg by mouth as needed (gas).     Syringe/Needle, Disp, (SYRINGE 3CC/25GX1") 25G X 1" 3 ML MISC Use for b12 injections 50 each 0   tamsulosin (FLOMAX) 0.4 MG CAPS capsule TAKE 1 CAPSULE BY MOUTH  DAILY AFTER BREAKFAST 100 capsule 2   No facility-administered medications prior to visit.    Review of Systems;  Patient denies headache, fevers, malaise, unintentional weight loss, skin rash, eye pain, sinus congestion and sinus pain, sore throat, dysphagia,  hemoptysis , cough, dyspnea, wheezing, chest pain, palpitations, orthopnea, edema, abdominal pain, nausea, melena, diarrhea, constipation, flank pain, dysuria, hematuria, urinary  Frequency, nocturia, numbness, tingling, seizures,  Focal weakness, Loss of consciousness,  Tremor, insomnia, depression, anxiety, and suicidal ideation.      Objective:  BP (!) 142/74   Pulse 69   Ht '5\' 10"'$  (1.778 m)   Wt 192 lb 9.6 oz (87.4 kg)   SpO2 96%    BMI 27.64 kg/m   BP Readings from Last 3 Encounters:  12/07/22 (!) 142/74  11/24/22 (!) 153/76  10/24/22 (!) 140/76    Wt Readings from Last 3 Encounters:  12/07/22 192 lb 9.6 oz (87.4 kg)  11/24/22 193 lb (87.5 kg)  10/24/22 196 lb 6.4 oz (89.1 kg)    Physical Exam Vitals reviewed.  Constitutional:      General: He is not in acute distress.    Appearance: Normal appearance. He is normal weight. He is not ill-appearing, toxic-appearing or diaphoretic.  HENT:     Head: Normocephalic.  Eyes:     General: No scleral icterus.       Right eye: No discharge.        Left eye:  No discharge.     Conjunctiva/sclera: Conjunctivae normal.  Neck:     Comments: Kyphosis of cervical spine noted  Cardiovascular:     Rate and Rhythm: Normal rate and regular rhythm.     Heart sounds: Normal heart sounds.  Pulmonary:     Effort: Pulmonary effort is normal. No respiratory distress.     Breath sounds: Normal breath sounds.  Abdominal:     General: Bowel sounds are normal. There is distension.     Palpations: Abdomen is soft. There is no mass.     Tenderness: There is no abdominal tenderness. There is guarding. There is no right CVA tenderness, left CVA tenderness or rebound.  Musculoskeletal:        General: Normal range of motion.     Cervical back: Normal range of motion.  Skin:    General: Skin is warm and dry.     Findings: No rash.     Comments: Xerosis of skin involving hands Thickened, yellowed nails of right hand only   Neurological:     General: No focal deficit present.     Mental Status: He is alert and oriented to person, place, and time. Mental status is at baseline.  Psychiatric:        Behavior: Behavior normal.        Thought Content: Thought content normal.        Judgment: Judgment normal.     Comments: Mildly anxious     Lab Results  Component Value Date   HGBA1C 5.7 11/17/2019   HGBA1C 5.7 02/08/2018   HGBA1C 5.6 11/02/2017    Lab Results  Component  Value Date   CREATININE 0.78 12/07/2022   CREATININE 0.81 10/24/2022   CREATININE 0.81 08/22/2022    Lab Results  Component Value Date   WBC 7.7 08/22/2022   HGB 13.7 08/22/2022   HCT 42.1 08/22/2022   PLT 203 08/22/2022   GLUCOSE 80 12/07/2022   CHOL 98 12/07/2022   TRIG 92.0 12/07/2022   HDL 39.50 12/07/2022   LDLDIRECT CANCELED 11/02/2017   LDLCALC 40 12/07/2022   ALT 20 12/07/2022   AST 24 12/07/2022   NA 142 12/07/2022   K 3.8 12/07/2022   CL 105 12/07/2022   CREATININE 0.78 12/07/2022   BUN 13 12/07/2022   CO2 31 12/07/2022   TSH 1.74 10/24/2022   HGBA1C 5.7 11/17/2019   MICROALBUR <0.7 02/08/2018    US Abdomen Limited RUQ (LIVER/GB)  Result Date: 11/17/2022 CLINICAL DATA:  Recurrent right-sided abdomen pain. EXAM: ULTRASOUND ABDOMEN LIMITED RIGHT UPPER QUADRANT COMPARISON:  CT abdomen pelvis November 08, 2022 FINDINGS: Gallbladder: Gallstones are noted. No wall thickening visualized. No sonographic Murphy sign noted by sonographer. Common bile duct: Diameter: 3.9 mm Liver: No focal lesion identified. CT noted subcentimeter hypodense liver lesions not well visualized on ultrasound. Mild diffuse increased echotexture of the liver is noted. Portal vein is patent on color Doppler imaging with normal direction of blood flow towards the liver. Other: None. IMPRESSION: 1. Cholelithiasis without sonographic evidence of acute cholecystitis. 2. CT noted subcentimeter hypodense liver lesions not well visualized on ultrasound. Fatty infiltration of liver. Electronically Signed   By: Abelardo Diesel M.D.   On: 11/17/2022 15:48    Assessment & Plan:  .Poor balance Assessment & Plan: Likely due to multiple factors including cerebral atrophy, deconditioning and proximal muscle weakness.  Using a rolling walker.  Home PT referral made   Orders: -     Ambulatory referral to Home  Health  Hyperlipidemia LDL goal <70 Assessment & Plan: Repeat  lipids today given change to higher potency  statin  noted LDL well below 70.  No changes today   Lab Results  Component Value Date   CHOL 98 12/07/2022   HDL 39.50 12/07/2022   LDLCALC 40 12/07/2022   LDLDIRECT CANCELED 11/02/2017   TRIG 92.0 12/07/2022   CHOLHDL 2 12/07/2022     Orders: -     Lipid panel -     Comprehensive metabolic panel  Dizziness and giddiness  History of fall -     Ambulatory referral to Home Health  Cerebrovascular disease Assessment & Plan: Progression of small vessel ischemic changes noted on recent MRI brain.  Tolerating change from  simvastatin to atorvastatin . Old lacunar strokes again noted involving BG.    Full incontinence of feces Assessment & Plan: Episodic secondary to use of osmotic laxatives during periods of obstipation. Episodes have occurred during syncopal events ,as well , raising issue of seizure .  Neurology referral to Dr Manuella Ghazi placed Nov 21  Advised to use magnesium and benefiber daily    Seizure [facility]) Assessment & Plan: Vs syncope.  Non cardiogenic given no evidence of arrhythmia on Zio monitor during events.  Referring to neurology to rule out seizure given concurrent fecal incontinence during several episodes    Calculus of gallbladder and bile duct without cholecystitis or obstruction Assessment & Plan: Noted on CT/abd ultrasound. No evidence of cholecystitis or bile duct blockage,  lFTs normal and he is asymptomatic.  Reassurance provided.    Generalized anxiety disorder Assessment & Plan: Faith based counseling given today    Primary hypertension Assessment & Plan: With old lacunar infarcts noted on 2019 MRI brain. Unchanged on 2023 brain MRI>. Continue current regimen, avoiding overcorrection given episodes of syncope that may be vasovagally induced.    PAD (peripheral artery disease) ([place]) Assessment & Plan: Reviewed most recent dopplers.  He has carotid stenosis,  non critical, and non palpable pedal pulses due to medial calcification but his waveforms  are good and his digital pressures are normal so his flow is maintained.  He denies claudication and other  limb threatening symptoms.  He follows up with vascular surgery annually.    Continue current medical regimen   Atherosclerosis of aortic bifurcation and common iliac arteries [facility]) Assessment & Plan: Reviewed findings of prior CT scan today..  Patient is tolerating statin therapy   Lab Results  Component Value Date   CHOL 98 12/07/2022   HDL 39.50 12/07/2022   LDLCALC 40 12/07/2022   LDLDIRECT CANCELED 11/02/2017   TRIG 92.0 12/07/2022   CHOLHDL 2 12/07/2022         I provided 40 minutes of face-to-face time during this encounter reviewing patient's last visit with me, patient's  most recent visit with vascular surgery,   recent labs and imaging studies, counseling on currently addressed issues,  and post visit ordering to diagnostics and therapeutics .   Follow-up: Return in about 6 months (around 06/07/2023).   [name], MD

## 2022-12-07 NOTE — Assessment & Plan Note (Addendum)
Noted on CT/abd ultrasound. No evidence of cholecystitis or bile duct blockage,  lFTs normal and he is asymptomatic.  Reassurance provided.

## 2022-12-07 NOTE — Assessment & Plan Note (Signed)
Progression of small vessel ischemic changes noted on recent MRI brain.  Tolerating change from  simvastatin to atorvastatin . Old lacunar strokes again noted involving BG.

## 2022-12-07 NOTE — Assessment & Plan Note (Signed)
Faith based counseling given today

## 2022-12-07 NOTE — Assessment & Plan Note (Addendum)
Repeat  lipids today given change to higher potency statin  noted LDL well below 70.  No changes today   Lab Results  Component Value Date   CHOL 98 12/07/2022   HDL 39.50 12/07/2022   LDLCALC 40 12/07/2022   LDLDIRECT CANCELED 11/02/2017   TRIG 92.0 12/07/2022   CHOLHDL 2 12/07/2022

## 2022-12-07 NOTE — Assessment & Plan Note (Addendum)
Episodic secondary to use of osmotic laxatives during periods of obstipation. Episodes have occurred during syncopal events ,as well , raising issue of seizure .  Neurology referral to Dr Manuella Ghazi placed Nov 21  Advised to use magnesium and benefiber daily

## 2022-12-07 NOTE — Assessment & Plan Note (Signed)
Likely due to multiple factors including cerebral atrophy, deconditioning and proximal muscle weakness.  Using a rolling walker.  Home PT referral made

## 2022-12-07 NOTE — Assessment & Plan Note (Addendum)
Vs syncope.  Non cardiogenic given no evidence of arrhythmia on Zio monitor during events.  Referring to neurology to rule out seizure given concurrent fecal incontinence during several episodes

## 2022-12-09 DIAGNOSIS — I700 Atherosclerosis of aorta: Secondary | ICD-10-CM | POA: Insufficient documentation

## 2022-12-09 NOTE — Assessment & Plan Note (Signed)
Reviewed findings of prior CT scan today..  Patient is tolerating statin therapy   Lab Results  Component Value Date   CHOL 98 12/07/2022   HDL 39.50 12/07/2022   LDLCALC 40 12/07/2022   LDLDIRECT CANCELED 11/02/2017   TRIG 92.0 12/07/2022   CHOLHDL 2 12/07/2022

## 2022-12-09 NOTE — Assessment & Plan Note (Signed)
Reviewed most recent dopplers.  He has carotid stenosis,  non critical, and non palpable pedal pulses due to medial calcification but his waveforms are good and his digital pressures are normal so his flow is maintained.  He denies claudication and other  limb threatening symptoms.  He follows up with vascular surgery annually.    Continue current medical regimen

## 2022-12-11 ENCOUNTER — Encounter (INDEPENDENT_AMBULATORY_CARE_PROVIDER_SITE_OTHER): Payer: Self-pay | Admitting: Nurse Practitioner

## 2022-12-11 NOTE — Progress Notes (Signed)
Subjective:    Patient ID: Devonte Migues, male    DOB: 1929-02-27, 89 y.o.   MRN: [id number] Chief Complaint  Patient presents with   Follow-up    ultrasound    The patient returns to the office for followup and review of the noninvasive studies, of his carotids and peripheral arterial disease.  There have been no interval changes in lower extremity symptoms. No interval shortening of the patient's claudication distance or development of rest pain symptoms. No new ulcers or wounds have occurred since the last visit.  The patient is taking enteric-coated aspirin 81 mg daily.  There have been no significant changes to the patient's overall health care.  The patient denies amaurosis fugax or recent TIA symptoms. There are no documented recent neurological changes noted. There is no history of DVT, PE or superficial thrombophlebitis. The patient denies recent episodes of angina or shortness of breath.   The patient has noncompressible ABIs bilaterally.  This is consistent with previous waveforms.  He has normal TBI's bilaterally.  With strong triphasic tibial artery waveforms and good toe waveforms bilaterally.   Carotid Duplex done today shows 1 to 39% stenosis bilaterally.  No change compared to last study in 2022    Review of Systems  All other systems reviewed and are negative.      Objective:   Physical Exam Vitals reviewed.  HENT:     Head: Normocephalic.  Cardiovascular:     Rate and Rhythm: Normal rate.     Pulses:          Dorsalis pedis pulses are detected w/ Doppler on the right side and detected w/ Doppler on the left side.       Posterior tibial pulses are detected w/ Doppler on the right side and detected w/ Doppler on the left side.  Pulmonary:     Effort: Pulmonary effort is normal.  Skin:    General: Skin is warm and dry.  Neurological:     Mental Status: He is alert and oriented to person, place, and time.  Psychiatric:        Mood and Affect: Mood  normal.        Behavior: Behavior normal.        Thought Content: Thought content normal.        Judgment: Judgment normal.     BP (!) 153/76 (BP Location: Left Arm)   Pulse 73   Resp 17   Ht '5\' 10"'$  (1.778 m)   Wt 193 lb (87.5 kg)   BMI 27.69 kg/m   Past Medical History:  Diagnosis Date   3-vessel coronary artery disease 2006   [place]   basal cell    Basal cell carcinoma 07/09/2012   BPH (benign prostatic hyperplasia)    Concussion w loss of consciousness of unsp duration, subs 12/29/2018   GERD (gastroesophageal reflux disease)    Hyperlipidemia    Hypertension    Lumbar stenosis 12/03/2013   MRI - lumbar pain with radiation down to LE   Myocardial infarction [facility]) 2006   OAB (overactive bladder)    Osteoporosis 2010   by DEXA at [place] apnea    Spondylolisthesis of lumbar region 11/23/2013   MRI Lumbar spine    Social History   Socioeconomic History   Marital status: Married    Spouse name: Not on file   Number of children: Not on file   Years of education: Not on file   Highest education level: Not  on file  Occupational History   Not on file  Tobacco Use   Smoking status: Former    Types: Cigarettes    Quit date: 07/10/1963    Years since quitting: 59.4   Smokeless tobacco: Never  Vaping Use   Vaping Use: Never used  Substance and Sexual Activity   Alcohol use: No   Drug use: No   Sexual activity: Not Currently  Other Topics Concern   Not on file  Social History Narrative   Not on file   Social Determinants of Health   Financial Resource Strain: Low Risk  (01/05/2022)   Overall Financial Resource Strain (CARDIA)    Difficulty of Paying Living Expenses: Not hard at all  Food Insecurity: No Food Insecurity (01/05/2022)   Hunger Vital Sign    Worried About Running Out of Food in the Last Year: Never true    [place] in the Last Year: Never true  Transportation Needs: No Transportation Needs (01/05/2022)   PRAPARE - Civil engineer, contracting (Medical): No    Lack of Transportation (Non-Medical): No  Physical Activity: Inactive (11/17/2020)   Exercise Vital Sign    Days of Exercise per Week: 0 days    Minutes of Exercise per Session: 0 min  Stress: No Stress Concern Present (01/05/2022)   [place]    Feeling of Stress : Not at all  Social Connections: Unknown (01/05/2022)   Social Connection and Isolation Panel [NHANES]    Frequency of Communication with Friends and Family: Not on file    Frequency of Social Gatherings with Friends and Family: Not on file    Attends Religious Services: Not on file    Active Member of Clubs or Organizations: Not on file    Attends Archivist Meetings: Not on file    Marital Status: Married  Intimate Partner Violence: Not At Risk (01/05/2022)   Humiliation, Afraid, Rape, and Kick questionnaire    Fear of Current or Ex-Partner: No    Emotionally Abused: No    Physically Abused: No    Sexually Abused: No    Past Surgical History:  Procedure Laterality Date   APPENDECTOMY  1951   COLONOSCOPY     COLONOSCOPY WITH PROPOFOL N/A 11/15/2015   Procedure: COLONOSCOPY WITH PROPOFOL;  Surgeon: Hulen Luster, MD;  Location: [facility] ENDOSCOPY;  Service: Gastroenterology;  Laterality: N/A;   CORONARY ARTERY BYPASS GRAFT  2006   3 vessel, s/p AMI   NASAL SEPTOPLASTY W/ TURBINOPLASTY  1981   TONSILLECTOMY      Family History  Problem Relation Age of Onset   Hyperlipidemia Mother    Hypertension Mother    Hypertension Father     Allergies  Allergen Reactions   Sulfa Antibiotics Rash       Latest Ref Rng & Units 08/22/2022   10:09 AM 08/31/2021    2:26 PM 03/14/2021    3:56 PM  CBC  WBC 4.0 - 10.5 K/uL 7.7  7.3  8.7   Hemoglobin 13.0 - 17.0 g/dL 13.7  14.7  13.5   Hematocrit 39.0 - 52.0 % 42.1  45.0  39.8   Platelets 150 - 400 K/uL 203  203.0  243.0       CMP     Component Value Date/Time   NA 142  12/07/2022 1303   NA 143 01/06/2014 1655   K 3.8 12/07/2022 1303   K 3.6 01/06/2014  1655   CL 105 12/07/2022 1303   CL 109 (H) 01/06/2014 1655   CO2 31 12/07/2022 1303   CO2 27 01/06/2014 1655   GLUCOSE 80 12/07/2022 1303   GLUCOSE 88 01/06/2014 1655   BUN 13 12/07/2022 1303   BUN 16 01/06/2014 1655   CREATININE 0.78 12/07/2022 1303   CREATININE 0.93 08/27/2020 1457   CALCIUM 9.1 12/07/2022 1303   CALCIUM 9.0 01/06/2014 1655   PROT 6.0 12/07/2022 1303   PROT 7.1 01/06/2014 1655   ALBUMIN 3.9 12/07/2022 1303   ALBUMIN 3.8 01/06/2014 1655   AST 24 12/07/2022 1303   AST 26 01/06/2014 1655   ALT 20 12/07/2022 1303   ALT 33 01/06/2014 1655   ALKPHOS 58 12/07/2022 1303   ALKPHOS 62 01/06/2014 1655   BILITOT 0.5 12/07/2022 1303   BILITOT 0.4 01/06/2014 1655   GFRNONAA >60 08/22/2022 1009   GFRNONAA >60 01/06/2014 1655   GFRAA >60 12/15/2018 1813   GFRAA >60 01/06/2014 1655     VAS [place] ABI WITH/WO TBI  Result Date: 11/30/2022  LOWER EXTREMITY DOPPLER STUDY Patient Name:  Daveion Robar  Date of Exam:   11/24/2022 Medical Rec #: [id number]       Accession #:    [id number] Date of Birth: August 12, 1929        Patient Gender: M Patient Age:   48 years Exam Location:  [facility] Procedure:      VAS [place] ABI WITH/WO TBI Referring Phys: --------------------------------------------------------------------------------  Indications: Peripheral artery disease. High Risk Factors: Hypertension, hyperlipidemia.  Performing Technologist: Concha Norway RVT  Examination Guidelines: A complete evaluation includes at minimum, Doppler waveform signals and systolic blood pressure reading at the level of bilateral brachial, anterior tibial, and posterior tibial arteries, when vessel segments are accessible. Bilateral testing is considered an integral part of a complete examination. Photoelectric Plethysmograph (PPG) waveforms and toe systolic pressure readings are included as required and additional  duplex testing as needed. Limited examinations for reoccurring indications may be performed as noted.  ABI Findings: +---------+------------------+-----+---------+--------+ Right    Rt Pressure (mmHg)IndexWaveform Comment  +---------+------------------+-----+---------+--------+ Brachial 150                                      +---------+------------------+-----+---------+--------+ ATA                             triphasic         +---------+------------------+-----+---------+--------+ PTA                             triphasic         +---------+------------------+-----+---------+--------+ Great Toe147               0.95 Normal            +---------+------------------+-----+---------+--------+ +---------+------------------+-----+---------+-------+ Left     Lt Pressure (mmHg)IndexWaveform Comment +---------+------------------+-----+---------+-------+ Brachial 155                                     +---------+------------------+-----+---------+-------+ ATA                             triphasic        +---------+------------------+-----+---------+-------+ PTA  triphasic        +---------+------------------+-----+---------+-------+ Great Toe136               0.88 Normal           +---------+------------------+-----+---------+-------+ +-------+-----------+-----------+------------+------------+ ABI/TBIToday's ABIToday's TBIPrevious ABIPrevious TBI +-------+-----------+-----------+------------+------------+ Right  [place]         .95        [place]          1.05         +-------+-----------+-----------+------------+------------+ Left   [place]         .88                  1.09         +-------+-----------+-----------+------------+------------+ Compared to prior study on 11/2021.  Summary: Right: Resting right ankle-brachial index indicates noncompressible right lower extremity arteries. The right toe-brachial index is normal. Left:  Resting left ankle-brachial index indicates noncompressible left lower extremity arteries. The left toe-brachial index is normal. *See table(s) above for measurements and observations.  Electronically signed by Leotis Pain MD on 11/30/2022 at 9:56:15 AM.    Final        Assessment & Plan:   1. PAD (peripheral artery disease) (HCC) His ABIs today remain noncompressible due to significant medial calcification but his waveforms are good and his digital pressures are normal so his flow is maintained.  No limb threatening symptoms.  Recheck in 1 year.  Continue current medical regimen    2. Bilateral carotid artery stenosis Carotid stenosis noted on 1 to 39% bilaterally.  No role for intervention at this level. Continue current medical regimen. Recheck in 1 year.   3. Primary hypertension Continue antihypertensive medications as already ordered, these medications have been reviewed and there are no changes at this time.   Current Outpatient Medications on File Prior to Visit  Medication Sig Dispense Refill   acetaminophen (TYLENOL) 500 MG tablet Take 500 mg by mouth at bedtime.      amLODipine (NORVASC) 2.5 MG tablet TAKE 1 TABLET BY MOUTH  DAILY 100 tablet 2   aspirin 81 MG EC tablet Take 81 mg by mouth daily.      atorvastatin (LIPITOR) 20 MG tablet Take 1 tablet (20 mg total) by mouth daily. 90 tablet 3   Azelastine-Fluticasone 137-50 MCG/ACT SUSP Place 1 spray into both nostrils 2 (two) times daily as needed. 23 g 5   Blood Pressure Monitoring (BLOOD PRESSURE MONITOR AUTOMAT) DEVI Use daily to check blood pressure 1 Device 0   cholecalciferol (VITAMIN D) 25 MCG (1000 UNIT) tablet      cyanocobalamin (,VITAMIN B-12,) 1000 MCG/ML injection INJECT 1 ML (1,000 MCG TOTAL) INTO THE MUSCLE ONCE A WEEK. FOR 4 WEEKS, THEN MONTHLY THEREAFTER 12 mL 1   diclofenac Sodium (VOLTAREN) 1 % GEL APPLY 4 GRAMS TOPICALLY 4  TIMES DAILY 400 g 0   erythromycin ophthalmic ointment SMARTSIG:0.25 Sparingly In Eye(s)  Every Night     finasteride (PROSCAR) 5 MG tablet TAKE 1 TABLET BY MOUTH  DAILY 100 tablet 2   lisinopril (ZESTRIL) 20 MG tablet TAKE 1 TABLET BY MOUTH  DAILY 100 tablet 2   metoprolol tartrate (LOPRESSOR) 25 MG tablet TAKE ONE-HALF TABLET BY MOUTH  TWICE DAILY 100 tablet 2   nitroGLYCERIN (NITROSTAT) 0.4 MG SL tablet Place 1 tablet (0.4 mg total) under the tongue every 5 (five) minutes as needed for chest pain. Maximum of 3 doses. 25 tablet 3   omeprazole (PRILOSEC) 20 MG capsule TAKE 2 CAPSULES  BY MOUTH  DAILY 180 capsule 3   oxybutynin (DITROPAN) 5 MG tablet TAKE 1 TABLET BY MOUTH  TWICE DAILY 200 tablet 2   polyethylene glycol powder (GLYCOLAX/MIRALAX) powder Take 17 g by mouth once. Dissolve 1 capful of power into any liquid and drink once daily (Patient taking differently: Take 17 g by mouth at bedtime. Dissolve 1 capful of power into any liquid and drink once daily) 500 g 0   rOPINIRole (REQUIP) 0.25 MG tablet TAKE 1 TABLET BY MOUTH AT  BEDTIME 100 tablet 2   Simethicone Extra Strength 125 MG CAPS Take 125 mg by mouth as needed (gas).     Syringe/Needle, Disp, (SYRINGE 3CC/25GX1") 25G X 1" 3 ML MISC Use for b12 injections 50 each 0   tamsulosin (FLOMAX) 0.4 MG CAPS capsule TAKE 1 CAPSULE BY MOUTH  DAILY AFTER BREAKFAST 100 capsule 2   No current facility-administered medications on file prior to visit.    There are no Patient Instructions on file for this visit. No follow-ups on file.   Kris Hartmann, NP

## 2022-12-14 DIAGNOSIS — G8929 Other chronic pain: Secondary | ICD-10-CM | POA: Diagnosis not present

## 2022-12-14 DIAGNOSIS — K5900 Constipation, unspecified: Secondary | ICD-10-CM | POA: Diagnosis not present

## 2022-12-14 DIAGNOSIS — M40202 Unspecified kyphosis, cervical region: Secondary | ICD-10-CM | POA: Diagnosis not present

## 2022-12-14 DIAGNOSIS — N3281 Overactive bladder: Secondary | ICD-10-CM | POA: Diagnosis not present

## 2022-12-14 DIAGNOSIS — R2681 Unsteadiness on feet: Secondary | ICD-10-CM | POA: Diagnosis not present

## 2022-12-14 DIAGNOSIS — I10 Essential (primary) hypertension: Secondary | ICD-10-CM | POA: Diagnosis not present

## 2022-12-14 DIAGNOSIS — I708 Atherosclerosis of other arteries: Secondary | ICD-10-CM | POA: Diagnosis not present

## 2022-12-14 DIAGNOSIS — I252 Old myocardial infarction: Secondary | ICD-10-CM | POA: Diagnosis not present

## 2022-12-14 DIAGNOSIS — M4316 Spondylolisthesis, lumbar region: Secondary | ICD-10-CM | POA: Diagnosis not present

## 2022-12-14 DIAGNOSIS — I6789 Other cerebrovascular disease: Secondary | ICD-10-CM | POA: Diagnosis not present

## 2022-12-14 DIAGNOSIS — R42 Dizziness and giddiness: Secondary | ICD-10-CM | POA: Diagnosis not present

## 2022-12-14 DIAGNOSIS — I6523 Occlusion and stenosis of bilateral carotid arteries: Secondary | ICD-10-CM | POA: Diagnosis not present

## 2022-12-14 DIAGNOSIS — G4733 Obstructive sleep apnea (adult) (pediatric): Secondary | ICD-10-CM | POA: Diagnosis not present

## 2022-12-14 DIAGNOSIS — R609 Edema, unspecified: Secondary | ICD-10-CM | POA: Diagnosis not present

## 2022-12-14 DIAGNOSIS — R197 Diarrhea, unspecified: Secondary | ICD-10-CM | POA: Diagnosis not present

## 2022-12-14 DIAGNOSIS — K219 Gastro-esophageal reflux disease without esophagitis: Secondary | ICD-10-CM | POA: Diagnosis not present

## 2022-12-14 DIAGNOSIS — I739 Peripheral vascular disease, unspecified: Secondary | ICD-10-CM | POA: Diagnosis not present

## 2022-12-14 DIAGNOSIS — K8020 Calculus of gallbladder without cholecystitis without obstruction: Secondary | ICD-10-CM | POA: Diagnosis not present

## 2022-12-14 DIAGNOSIS — I2510 Atherosclerotic heart disease of native coronary artery without angina pectoris: Secondary | ICD-10-CM | POA: Diagnosis not present

## 2022-12-14 DIAGNOSIS — E785 Hyperlipidemia, unspecified: Secondary | ICD-10-CM | POA: Diagnosis not present

## 2022-12-14 DIAGNOSIS — R1031 Right lower quadrant pain: Secondary | ICD-10-CM | POA: Diagnosis not present

## 2022-12-18 DIAGNOSIS — I10 Essential (primary) hypertension: Secondary | ICD-10-CM | POA: Diagnosis not present

## 2022-12-18 DIAGNOSIS — G4733 Obstructive sleep apnea (adult) (pediatric): Secondary | ICD-10-CM | POA: Diagnosis not present

## 2022-12-22 DIAGNOSIS — I252 Old myocardial infarction: Secondary | ICD-10-CM | POA: Diagnosis not present

## 2022-12-22 DIAGNOSIS — I6523 Occlusion and stenosis of bilateral carotid arteries: Secondary | ICD-10-CM | POA: Diagnosis not present

## 2022-12-22 DIAGNOSIS — R2681 Unsteadiness on feet: Secondary | ICD-10-CM | POA: Diagnosis not present

## 2022-12-22 DIAGNOSIS — R1031 Right lower quadrant pain: Secondary | ICD-10-CM | POA: Diagnosis not present

## 2022-12-22 DIAGNOSIS — K219 Gastro-esophageal reflux disease without esophagitis: Secondary | ICD-10-CM | POA: Diagnosis not present

## 2022-12-22 DIAGNOSIS — I2510 Atherosclerotic heart disease of native coronary artery without angina pectoris: Secondary | ICD-10-CM | POA: Diagnosis not present

## 2022-12-22 DIAGNOSIS — I10 Essential (primary) hypertension: Secondary | ICD-10-CM | POA: Diagnosis not present

## 2022-12-22 DIAGNOSIS — K5900 Constipation, unspecified: Secondary | ICD-10-CM | POA: Diagnosis not present

## 2022-12-22 DIAGNOSIS — I708 Atherosclerosis of other arteries: Secondary | ICD-10-CM | POA: Diagnosis not present

## 2022-12-22 DIAGNOSIS — K8020 Calculus of gallbladder without cholecystitis without obstruction: Secondary | ICD-10-CM | POA: Diagnosis not present

## 2022-12-22 DIAGNOSIS — E785 Hyperlipidemia, unspecified: Secondary | ICD-10-CM | POA: Diagnosis not present

## 2022-12-22 DIAGNOSIS — M4316 Spondylolisthesis, lumbar region: Secondary | ICD-10-CM | POA: Diagnosis not present

## 2022-12-22 DIAGNOSIS — M40202 Unspecified kyphosis, cervical region: Secondary | ICD-10-CM | POA: Diagnosis not present

## 2022-12-22 DIAGNOSIS — G8929 Other chronic pain: Secondary | ICD-10-CM | POA: Diagnosis not present

## 2022-12-22 DIAGNOSIS — N3281 Overactive bladder: Secondary | ICD-10-CM | POA: Diagnosis not present

## 2022-12-22 DIAGNOSIS — G4733 Obstructive sleep apnea (adult) (pediatric): Secondary | ICD-10-CM | POA: Diagnosis not present

## 2022-12-22 DIAGNOSIS — I739 Peripheral vascular disease, unspecified: Secondary | ICD-10-CM | POA: Diagnosis not present

## 2022-12-22 DIAGNOSIS — R42 Dizziness and giddiness: Secondary | ICD-10-CM | POA: Diagnosis not present

## 2022-12-22 DIAGNOSIS — I6789 Other cerebrovascular disease: Secondary | ICD-10-CM | POA: Diagnosis not present

## 2022-12-22 DIAGNOSIS — R609 Edema, unspecified: Secondary | ICD-10-CM | POA: Diagnosis not present

## 2022-12-22 DIAGNOSIS — R197 Diarrhea, unspecified: Secondary | ICD-10-CM | POA: Diagnosis not present

## 2022-12-26 DIAGNOSIS — R2681 Unsteadiness on feet: Secondary | ICD-10-CM

## 2022-12-26 DIAGNOSIS — R42 Dizziness and giddiness: Secondary | ICD-10-CM

## 2022-12-26 DIAGNOSIS — Z9181 History of falling: Secondary | ICD-10-CM

## 2022-12-26 DIAGNOSIS — I708 Atherosclerosis of other arteries: Secondary | ICD-10-CM

## 2022-12-26 DIAGNOSIS — R1031 Right lower quadrant pain: Secondary | ICD-10-CM

## 2022-12-26 DIAGNOSIS — Z951 Presence of aortocoronary bypass graft: Secondary | ICD-10-CM

## 2022-12-26 DIAGNOSIS — M4316 Spondylolisthesis, lumbar region: Secondary | ICD-10-CM

## 2022-12-26 DIAGNOSIS — Z95828 Presence of other vascular implants and grafts: Secondary | ICD-10-CM

## 2022-12-26 DIAGNOSIS — G40909 Epilepsy, unspecified, not intractable, without status epilepticus: Secondary | ICD-10-CM

## 2022-12-26 DIAGNOSIS — R197 Diarrhea, unspecified: Secondary | ICD-10-CM

## 2022-12-26 DIAGNOSIS — R1314 Dysphagia, pharyngoesophageal phase: Secondary | ICD-10-CM

## 2022-12-26 DIAGNOSIS — M48061 Spinal stenosis, lumbar region without neurogenic claudication: Secondary | ICD-10-CM

## 2022-12-26 DIAGNOSIS — E663 Overweight: Secondary | ICD-10-CM

## 2022-12-26 DIAGNOSIS — R609 Edema, unspecified: Secondary | ICD-10-CM

## 2022-12-26 DIAGNOSIS — N401 Enlarged prostate with lower urinary tract symptoms: Secondary | ICD-10-CM

## 2022-12-26 DIAGNOSIS — Z7982 Long term (current) use of aspirin: Secondary | ICD-10-CM

## 2022-12-26 DIAGNOSIS — G4733 Obstructive sleep apnea (adult) (pediatric): Secondary | ICD-10-CM

## 2022-12-26 DIAGNOSIS — M40202 Unspecified kyphosis, cervical region: Secondary | ICD-10-CM

## 2022-12-26 DIAGNOSIS — Z8673 Personal history of transient ischemic attack (TIA), and cerebral infarction without residual deficits: Secondary | ICD-10-CM

## 2022-12-26 DIAGNOSIS — K5900 Constipation, unspecified: Secondary | ICD-10-CM

## 2022-12-26 DIAGNOSIS — I252 Old myocardial infarction: Secondary | ICD-10-CM

## 2022-12-26 DIAGNOSIS — K219 Gastro-esophageal reflux disease without esophagitis: Secondary | ICD-10-CM

## 2022-12-26 DIAGNOSIS — E785 Hyperlipidemia, unspecified: Secondary | ICD-10-CM

## 2022-12-26 DIAGNOSIS — H8110 Benign paroxysmal vertigo, unspecified ear: Secondary | ICD-10-CM

## 2022-12-26 DIAGNOSIS — I2510 Atherosclerotic heart disease of native coronary artery without angina pectoris: Secondary | ICD-10-CM

## 2022-12-26 DIAGNOSIS — R159 Full incontinence of feces: Secondary | ICD-10-CM

## 2022-12-26 DIAGNOSIS — I739 Peripheral vascular disease, unspecified: Secondary | ICD-10-CM

## 2022-12-26 DIAGNOSIS — K8020 Calculus of gallbladder without cholecystitis without obstruction: Secondary | ICD-10-CM

## 2022-12-26 DIAGNOSIS — I6789 Other cerebrovascular disease: Secondary | ICD-10-CM

## 2022-12-26 DIAGNOSIS — N3281 Overactive bladder: Secondary | ICD-10-CM

## 2022-12-26 DIAGNOSIS — I6523 Occlusion and stenosis of bilateral carotid arteries: Secondary | ICD-10-CM

## 2022-12-26 DIAGNOSIS — F411 Generalized anxiety disorder: Secondary | ICD-10-CM

## 2022-12-26 DIAGNOSIS — G8929 Other chronic pain: Secondary | ICD-10-CM

## 2022-12-26 DIAGNOSIS — I10 Essential (primary) hypertension: Secondary | ICD-10-CM

## 2022-12-27 ENCOUNTER — Inpatient Hospital Stay
Admission: EM | Admit: 2022-12-27 | Discharge: 2022-12-30 | DRG: 872 | Disposition: A | Discharge status: Home Health | Payer: Medicare Other | Attending: Internal Medicine | Admitting: Internal Medicine

## 2022-12-27 ENCOUNTER — Encounter: Payer: Self-pay | Admitting: Radiology

## 2022-12-27 ENCOUNTER — Other Ambulatory Visit: Payer: Self-pay

## 2022-12-27 ENCOUNTER — Emergency Department: Payer: Medicare Other

## 2022-12-27 ENCOUNTER — Inpatient Hospital Stay: Payer: Medicare Other | Admitting: Radiology

## 2022-12-27 ENCOUNTER — Inpatient Hospital Stay: Payer: Medicare Other

## 2022-12-27 DIAGNOSIS — A419 Sepsis, unspecified organism: Principal | ICD-10-CM | POA: Diagnosis present

## 2022-12-27 DIAGNOSIS — M6281 Muscle weakness (generalized): Secondary | ICD-10-CM | POA: Diagnosis not present

## 2022-12-27 DIAGNOSIS — Z7982 Long term (current) use of aspirin: Secondary | ICD-10-CM | POA: Diagnosis not present

## 2022-12-27 DIAGNOSIS — N3281 Overactive bladder: Secondary | ICD-10-CM | POA: Diagnosis not present

## 2022-12-27 DIAGNOSIS — I2510 Atherosclerotic heart disease of native coronary artery without angina pectoris: Secondary | ICD-10-CM | POA: Diagnosis not present

## 2022-12-27 DIAGNOSIS — Z79899 Other long term (current) drug therapy: Secondary | ICD-10-CM

## 2022-12-27 DIAGNOSIS — M810 Age-related osteoporosis without current pathological fracture: Secondary | ICD-10-CM | POA: Diagnosis present

## 2022-12-27 DIAGNOSIS — M48061 Spinal stenosis, lumbar region without neurogenic claudication: Secondary | ICD-10-CM | POA: Diagnosis present

## 2022-12-27 DIAGNOSIS — M4316 Spondylolisthesis, lumbar region: Secondary | ICD-10-CM | POA: Diagnosis present

## 2022-12-27 DIAGNOSIS — R6889 Other general symptoms and signs: Secondary | ICD-10-CM | POA: Diagnosis not present

## 2022-12-27 DIAGNOSIS — Z87891 Personal history of nicotine dependence: Secondary | ICD-10-CM | POA: Diagnosis not present

## 2022-12-27 DIAGNOSIS — R531 Weakness: Secondary | ICD-10-CM | POA: Diagnosis not present

## 2022-12-27 DIAGNOSIS — G4733 Obstructive sleep apnea (adult) (pediatric): Secondary | ICD-10-CM

## 2022-12-27 DIAGNOSIS — E876 Hypokalemia: Secondary | ICD-10-CM | POA: Diagnosis not present

## 2022-12-27 DIAGNOSIS — Z882 Allergy status to sulfonamides status: Secondary | ICD-10-CM

## 2022-12-27 DIAGNOSIS — E785 Hyperlipidemia, unspecified: Secondary | ICD-10-CM | POA: Diagnosis present

## 2022-12-27 DIAGNOSIS — N400 Enlarged prostate without lower urinary tract symptoms: Secondary | ICD-10-CM | POA: Diagnosis present

## 2022-12-27 DIAGNOSIS — K219 Gastro-esophageal reflux disease without esophagitis: Secondary | ICD-10-CM | POA: Diagnosis present

## 2022-12-27 DIAGNOSIS — J449 Chronic obstructive pulmonary disease, unspecified: Secondary | ICD-10-CM | POA: Diagnosis not present

## 2022-12-27 DIAGNOSIS — R079 Chest pain, unspecified: Secondary | ICD-10-CM | POA: Diagnosis not present

## 2022-12-27 DIAGNOSIS — Z66 Do not resuscitate: Secondary | ICD-10-CM | POA: Diagnosis not present

## 2022-12-27 DIAGNOSIS — J189 Pneumonia, unspecified organism: Secondary | ICD-10-CM | POA: Insufficient documentation

## 2022-12-27 DIAGNOSIS — I110 Hypertensive heart disease with heart failure: Secondary | ICD-10-CM | POA: Diagnosis present

## 2022-12-27 DIAGNOSIS — I10 Essential (primary) hypertension: Secondary | ICD-10-CM | POA: Diagnosis present

## 2022-12-27 DIAGNOSIS — Z85828 Personal history of other malignant neoplasm of skin: Secondary | ICD-10-CM

## 2022-12-27 DIAGNOSIS — Z743 Need for continuous supervision: Secondary | ICD-10-CM | POA: Diagnosis not present

## 2022-12-27 DIAGNOSIS — Z951 Presence of aortocoronary bypass graft: Secondary | ICD-10-CM

## 2022-12-27 DIAGNOSIS — K810 Acute cholecystitis: Principal | ICD-10-CM | POA: Diagnosis present

## 2022-12-27 DIAGNOSIS — Z8249 Family history of ischemic heart disease and other diseases of the circulatory system: Secondary | ICD-10-CM

## 2022-12-27 DIAGNOSIS — Z1152 Encounter for screening for COVID-19: Secondary | ICD-10-CM

## 2022-12-27 DIAGNOSIS — K8020 Calculus of gallbladder without cholecystitis without obstruction: Secondary | ICD-10-CM | POA: Diagnosis not present

## 2022-12-27 DIAGNOSIS — I5032 Chronic diastolic (congestive) heart failure: Secondary | ICD-10-CM | POA: Diagnosis present

## 2022-12-27 DIAGNOSIS — R5381 Other malaise: Secondary | ICD-10-CM | POA: Diagnosis present

## 2022-12-27 DIAGNOSIS — I700 Atherosclerosis of aorta: Secondary | ICD-10-CM | POA: Diagnosis present

## 2022-12-27 DIAGNOSIS — R509 Fever, unspecified: Secondary | ICD-10-CM | POA: Diagnosis not present

## 2022-12-27 DIAGNOSIS — I252 Old myocardial infarction: Secondary | ICD-10-CM

## 2022-12-27 DIAGNOSIS — R109 Unspecified abdominal pain: Secondary | ICD-10-CM | POA: Diagnosis not present

## 2022-12-27 DIAGNOSIS — Z83438 Family history of other disorder of lipoprotein metabolism and other lipidemia: Secondary | ICD-10-CM

## 2022-12-27 DIAGNOSIS — R0689 Other abnormalities of breathing: Secondary | ICD-10-CM | POA: Diagnosis not present

## 2022-12-27 HISTORY — PX: IR PERC CHOLECYSTOSTOMY: IMG2326

## 2022-12-27 HISTORY — DX: Sepsis, unspecified organism: A41.9

## 2022-12-27 LAB — URINALYSIS, ROUTINE W REFLEX MICROSCOPIC
Bilirubin Urine: NEGATIVE
Glucose, UA: NEGATIVE mg/dL
Hgb urine dipstick: NEGATIVE
Ketones, ur: NEGATIVE mg/dL
Leukocytes,Ua: NEGATIVE
Nitrite: NEGATIVE
Protein, ur: 30 mg/dL — AB
Specific Gravity, Urine: >1.046 — ABNORMAL HIGH (ref 1.005–1.030)
pH: 5 (ref 5.0–8.0)

## 2022-12-27 LAB — PHOSPHORUS: Phosphorus: 3.2 mg/dL (ref 2.5–4.6)

## 2022-12-27 LAB — COMPREHENSIVE METABOLIC PANEL
ALT: 20 U/L (ref 0–44)
AST: 26 U/L (ref 15–41)
Albumin: 3.3 g/dL — ABNORMAL LOW (ref 3.5–5.0)
Alkaline Phosphatase: 46 U/L (ref 38–126)
Anion gap: 8 (ref 5–15)
BUN: 16 mg/dL (ref 8–23)
CO2: 23 mmol/L (ref 22–32)
Calcium: 8 mg/dL — ABNORMAL LOW (ref 8.9–10.3)
Chloride: 105 mmol/L (ref 98–111)
Creatinine, Ser: 0.78 mg/dL (ref 0.61–1.24)
GFR, Estimated: >60 mL/min (ref >60)
Glucose, Bld: 124 mg/dL — ABNORMAL HIGH (ref 70–99)
Potassium: 2.9 mmol/L — ABNORMAL LOW (ref 3.5–5.1)
Sodium: 136 mmol/L (ref 135–145)
Total Bilirubin: 1.3 mg/dL — ABNORMAL HIGH (ref 0.3–1.2)
Total Protein: 5.9 g/dL — ABNORMAL LOW (ref 6.5–8.1)

## 2022-12-27 LAB — CBC WITH DIFFERENTIAL/PLATELET
Abs Immature Granulocytes: 0.5 10*3/uL — ABNORMAL HIGH (ref 0.00–0.07)
Basophils Absolute: 0.1 10*3/uL (ref 0.0–0.1)
Basophils Relative: 0 %
Eosinophils Absolute: 0 10*3/uL (ref 0.0–0.5)
Eosinophils Relative: 0 %
HCT: 41 % (ref 39.0–52.0)
Hemoglobin: 13.8 g/dL (ref 13.0–17.0)
Immature Granulocytes: 2 %
Lymphocytes Relative: 3 %
Lymphs Abs: 0.8 10*3/uL (ref 0.7–4.0)
MCH: 32.3 pg (ref 26.0–34.0)
MCHC: 33.7 g/dL (ref 30.0–36.0)
MCV: 96 fL (ref 80.0–100.0)
Monocytes Absolute: 1.3 10*3/uL — ABNORMAL HIGH (ref 0.1–1.0)
Monocytes Relative: 5 %
Neutro Abs: 22.3 10*3/uL — ABNORMAL HIGH (ref 1.7–7.7)
Neutrophils Relative %: 90 %
Platelets: 192 10*3/uL (ref 150–400)
RBC: 4.27 MIL/uL (ref 4.22–5.81)
RDW: 12.9 % (ref 11.5–15.5)
WBC: 25 10*3/uL — ABNORMAL HIGH (ref 4.0–10.5)
nRBC: 0 % (ref 0.0–0.2)

## 2022-12-27 LAB — PROTIME-INR
INR: 1.2 (ref 0.8–1.2)
Prothrombin Time: 15.2 seconds (ref 11.4–15.2)

## 2022-12-27 LAB — TYPE AND SCREEN
ABO/RH(D): O NEG
Antibody Screen: NEGATIVE

## 2022-12-27 LAB — BRAIN NATRIURETIC PEPTIDE: B Natriuretic Peptide: 723.5 pg/mL — ABNORMAL HIGH (ref 0.0–100.0)

## 2022-12-27 LAB — RESP PANEL BY RT-PCR (FLU A&B, COVID) ARPGX2
Influenza A by PCR: NEGATIVE
Influenza B by PCR: NEGATIVE
SARS Coronavirus 2 by RT PCR: NEGATIVE

## 2022-12-27 LAB — LACTIC ACID, PLASMA
Lactic Acid, Venous: 1.5 mmol/L (ref 0.5–1.9)
Lactic Acid, Venous: <0.3 mmol/L — ABNORMAL LOW (ref 0.5–1.9)

## 2022-12-27 LAB — PROCALCITONIN: Procalcitonin: 2.16 ng/mL

## 2022-12-27 LAB — APTT: aPTT: 34 seconds (ref 24–36)

## 2022-12-27 LAB — MAGNESIUM: Magnesium: 1.9 mg/dL (ref 1.7–2.4)

## 2022-12-27 MED ORDER — FENTANYL CITRATE (PF) 100 MCG/2ML IJ SOLN
INTRAMUSCULAR | Status: AC
  Filled 2022-12-27: qty 2

## 2022-12-27 MED ORDER — TAMSULOSIN HCL 0.4 MG PO CAPS
0.4000 mg | ORAL_CAPSULE | Freq: Every day | ORAL | Status: DC
  Administered 2022-12-28 – 2022-12-30 (×3): 0.4 mg via ORAL
  Filled 2022-12-27 (×3): qty 1

## 2022-12-27 MED ORDER — ROPINIROLE HCL 0.25 MG PO TABS
0.2500 mg | ORAL_TABLET | Freq: Every day | ORAL | Status: DC
  Administered 2022-12-27 – 2022-12-29 (×3): 0.25 mg via ORAL
  Filled 2022-12-27 (×3): qty 1

## 2022-12-27 MED ORDER — PIPERACILLIN-TAZOBACTAM 3.375 G IVPB
3.3750 g | Freq: Three times a day (TID) | INTRAVENOUS | Status: DC
  Administered 2022-12-27 – 2022-12-30 (×9): 3.375 g via INTRAVENOUS
  Filled 2022-12-27 (×9): qty 50

## 2022-12-27 MED ORDER — METOPROLOL TARTRATE 25 MG PO TABS
12.5000 mg | ORAL_TABLET | Freq: Two times a day (BID) | ORAL | Status: DC
  Administered 2022-12-27 – 2022-12-30 (×7): 12.5 mg via ORAL
  Filled 2022-12-27 (×7): qty 1

## 2022-12-27 MED ORDER — ASPIRIN 81 MG PO TBEC
81.0000 mg | DELAYED_RELEASE_TABLET | Freq: Every day | ORAL | Status: DC
  Administered 2022-12-27 – 2022-12-30 (×4): 81 mg via ORAL
  Filled 2022-12-27 (×4): qty 1

## 2022-12-27 MED ORDER — ACETAMINOPHEN 325 MG PO TABS
650.0000 mg | ORAL_TABLET | Freq: Four times a day (QID) | ORAL | Status: DC | PRN
  Administered 2022-12-28: 650 mg via ORAL
  Filled 2022-12-27: qty 2

## 2022-12-27 MED ORDER — FINASTERIDE 5 MG PO TABS
5.0000 mg | ORAL_TABLET | Freq: Every day | ORAL | Status: DC
  Administered 2022-12-27 – 2022-12-30 (×4): 5 mg via ORAL
  Filled 2022-12-27 (×4): qty 1

## 2022-12-27 MED ORDER — SIMETHICONE 80 MG PO CHEW: 80.0000 mg | CHEWABLE_TABLET | ORAL | Status: DC | PRN

## 2022-12-27 MED ORDER — POLYETHYLENE GLYCOL 3350 17 GM/SCOOP PO POWD
17.0000 g | Freq: Every day | ORAL | Status: DC
  Filled 2022-12-27: qty 255

## 2022-12-27 MED ORDER — POTASSIUM CHLORIDE CRYS ER 20 MEQ PO TBCR: 40.0000 meq | EXTENDED_RELEASE_TABLET | Freq: Once | ORAL | Status: DC

## 2022-12-27 MED ORDER — IOHEXOL 300 MG/ML  SOLN
10.0000 mL | Freq: Once | INTRAMUSCULAR | Status: AC | PRN
  Administered 2022-12-27: 10 mL

## 2022-12-27 MED ORDER — ACETAMINOPHEN 500 MG PO TABS
1000.0000 mg | ORAL_TABLET | ORAL | Status: AC
  Administered 2022-12-27: 1000 mg via ORAL
  Filled 2022-12-27: qty 2

## 2022-12-27 MED ORDER — LACTATED RINGERS IV SOLN: INTRAVENOUS | Status: DC

## 2022-12-27 MED ORDER — LIDOCAINE HCL 1 % IJ SOLN
INTRAMUSCULAR | Status: AC
  Administered 2022-12-27: 8 mL
  Filled 2022-12-27: qty 20

## 2022-12-27 MED ORDER — MIDAZOLAM HCL 2 MG/2ML IJ SOLN
INTRAMUSCULAR | Status: AC | PRN
  Administered 2022-12-27: 1 mg via INTRAVENOUS

## 2022-12-27 MED ORDER — SODIUM CHLORIDE 0.9 % IV SOLN
2.0000 g | Freq: Once | INTRAVENOUS | Status: AC
  Administered 2022-12-27: 2 g via INTRAVENOUS
  Filled 2022-12-27: qty 12.5

## 2022-12-27 MED ORDER — OXYCODONE-ACETAMINOPHEN 5-325 MG PO TABS: 1.0000 | ORAL_TABLET | ORAL | Status: DC | PRN

## 2022-12-27 MED ORDER — OXYBUTYNIN CHLORIDE 5 MG PO TABS
5.0000 mg | ORAL_TABLET | Freq: Two times a day (BID) | ORAL | Status: DC
  Administered 2022-12-27 – 2022-12-29 (×5): 5 mg via ORAL
  Filled 2022-12-27 (×7): qty 1

## 2022-12-27 MED ORDER — ATORVASTATIN CALCIUM 20 MG PO TABS
20.0000 mg | ORAL_TABLET | Freq: Every day | ORAL | Status: DC
  Administered 2022-12-27 – 2022-12-30 (×4): 20 mg via ORAL
  Filled 2022-12-27 (×4): qty 1

## 2022-12-27 MED ORDER — POTASSIUM CHLORIDE CRYS ER 20 MEQ PO TBCR
40.0000 meq | EXTENDED_RELEASE_TABLET | ORAL | Status: AC
  Administered 2022-12-27 (×2): 40 meq via ORAL
  Filled 2022-12-27 (×2): qty 2

## 2022-12-27 MED ORDER — MORPHINE SULFATE (PF) 2 MG/ML IV SOLN: 1.0000 mg | INTRAVENOUS | Status: DC | PRN

## 2022-12-27 MED ORDER — POTASSIUM CHLORIDE 10 MEQ/100ML IV SOLN
10.0000 meq | INTRAVENOUS | Status: AC
  Administered 2022-12-27 (×2): 10 meq via INTRAVENOUS
  Filled 2022-12-27: qty 100

## 2022-12-27 MED ORDER — FENTANYL CITRATE (PF) 100 MCG/2ML IJ SOLN
INTRAMUSCULAR | Status: AC | PRN
  Administered 2022-12-27: 50 ug via INTRAVENOUS

## 2022-12-27 MED ORDER — HYDRALAZINE HCL 20 MG/ML IJ SOLN
5.0000 mg | INTRAMUSCULAR | Status: DC | PRN
  Administered 2022-12-29 – 2022-12-30 (×2): 5 mg via INTRAVENOUS
  Filled 2022-12-27 (×2): qty 1

## 2022-12-27 MED ORDER — HEPARIN SODIUM (PORCINE) 5000 UNIT/ML IJ SOLN: 5000.0000 [IU] | Freq: Three times a day (TID) | INTRAMUSCULAR | Status: DC

## 2022-12-27 MED ORDER — ENOXAPARIN SODIUM 40 MG/0.4ML IJ SOSY
40.0000 mg | PREFILLED_SYRINGE | INTRAMUSCULAR | Status: DC
  Administered 2022-12-28 – 2022-12-30 (×3): 40 mg via SUBCUTANEOUS
  Filled 2022-12-27 (×3): qty 0.4

## 2022-12-27 MED ORDER — VITAMIN D 25 MCG (1000 UNIT) PO TABS
1000.0000 [IU] | ORAL_TABLET | Freq: Every day | ORAL | Status: DC
  Administered 2022-12-27 – 2022-12-30 (×4): 1000 [IU] via ORAL
  Filled 2022-12-27 (×4): qty 1

## 2022-12-27 MED ORDER — SODIUM CHLORIDE 0.9 % IV SOLN: INTRAVENOUS | Status: DC

## 2022-12-27 MED ORDER — IOHEXOL 350 MG/ML SOLN
75.0000 mL | Freq: Once | INTRAVENOUS | Status: AC | PRN
  Administered 2022-12-27: 75 mL via INTRAVENOUS

## 2022-12-27 MED ORDER — ONDANSETRON HCL 4 MG/2ML IJ SOLN: 4.0000 mg | Freq: Three times a day (TID) | INTRAMUSCULAR | Status: DC | PRN

## 2022-12-27 MED ORDER — VANCOMYCIN HCL IN DEXTROSE 1-5 GM/200ML-% IV SOLN
1000.0000 mg | Freq: Once | INTRAVENOUS | Status: DC
  Filled 2022-12-27: qty 200

## 2022-12-27 MED ORDER — ENOXAPARIN SODIUM 40 MG/0.4ML IJ SOSY: 40.0000 mg | PREFILLED_SYRINGE | INTRAMUSCULAR | Status: DC

## 2022-12-27 MED ORDER — MIDAZOLAM HCL 2 MG/2ML IJ SOLN
INTRAMUSCULAR | Status: AC
  Filled 2022-12-27: qty 2

## 2022-12-27 MED ORDER — PANTOPRAZOLE SODIUM 40 MG PO TBEC
40.0000 mg | DELAYED_RELEASE_TABLET | Freq: Every day | ORAL | Status: DC
  Administered 2022-12-27 – 2022-12-30 (×4): 40 mg via ORAL
  Filled 2022-12-27 (×4): qty 1

## 2022-12-27 MED ORDER — DM-GUAIFENESIN ER 30-600 MG PO TB12: 1.0000 | ORAL_TABLET | Freq: Two times a day (BID) | ORAL | Status: DC | PRN

## 2022-12-27 MED ORDER — ALBUTEROL SULFATE (2.5 MG/3ML) 0.083% IN NEBU: 2.5000 mg | INHALATION_SOLUTION | RESPIRATORY_TRACT | Status: DC | PRN

## 2022-12-27 MED ORDER — VANCOMYCIN HCL 1750 MG/350ML IV SOLN
1750.0000 mg | Freq: Once | INTRAVENOUS | Status: AC
  Administered 2022-12-27: 1750 mg via INTRAVENOUS
  Filled 2022-12-27: qty 350

## 2022-12-27 MED ORDER — NITROGLYCERIN 0.4 MG SL SUBL: 0.4000 mg | SUBLINGUAL_TABLET | SUBLINGUAL | Status: DC | PRN

## 2022-12-27 NOTE — ED Notes (Signed)
NP here from surgery, consent obtained after consultation.

## 2022-12-27 NOTE — ED Notes (Signed)
Assumed care from OR. Pt resting comfortably in bed at this time. Pt denies any current needs or questions. Call light with in reach.

## 2022-12-27 NOTE — Consult Note (Signed)
Chief Complaint: Patient was seen in consultation today for acute cholecystitis at the request of Ardell Isaacs, PA  Referring Physician(s): Ardell Isaacs, PA  Supervising Physician: Sandi Mariscal  Patient Status: [facility] - In-pt  History of Present Illness: David Atkins is a 89 y.o. male with PMH significant for HTN, CHF, COPD, GERD, MI, and sleep apnea presented to ED 12/27/22 c/o fever and right flank pain x 1 month. ED workup found pt to have leukocytosis, fever, K+ 2.9 and lactic 1.5. CTA revealed: 1. No evidence of pulmonary embolism. 2. Distended gallbladder with 2 radiopaque stones in the neck, wall thickening and pericholecystic fluid. Findings are concerning for acute cholecystitis. Consider right upper quadrant ultrasound for further evaluation. [place] abd demonstrated findings consistent with acute cholecystitis. Surgery consulted and deemed pt sub-optimal surgical candidate. Hew was referred to IR for percutaneous cholecystomy placement. Images were reviewed and approved by Dr. Pascal Lux, IR.   Pt denies CP, SOB, abd pain, N/V, loss of appetite or weakness.  He endorses flank pain and BLE edema.  Last PO intake was bites of applesauce with Tylenol around noon.    Past Medical History:  Diagnosis Date   3-vessel coronary artery disease 2006   [place]   basal cell    Basal cell carcinoma 07/09/2012   BPH (benign prostatic hyperplasia)    Concussion w loss of consciousness of unsp duration, subs 12/29/2018   GERD (gastroesophageal reflux disease)    Hyperlipidemia    Hypertension    Lumbar stenosis 12/03/2013   MRI - lumbar pain with radiation down to LE   Myocardial infarction [facility]) 2006   OAB (overactive bladder)    Osteoporosis 2010   by DEXA at [place] ap[facility]    Spondylolisthesis of lumbar region 11/23/2013   MRI Lumbar spine    Past Surgical History:  Procedure Laterality Date   APPENDECTOMY  1951   COLONOSCOPY     COLONOSCOPY WITH PROPOFOL N/A 11/15/2015    Procedure: COLONOSCOPY WITH PROPOFOL;  Surgeon: Hulen Luster, MD;  Location: [facility] ENDOSCOPY;  Service: Gastroenterology;  Laterality: N/A;   CORONARY ARTERY BYPASS GRAFT  2006   3 vessel, s/p AMI   NASAL SEPTOPLASTY W/ TURBINOPLASTY  1981   TONSILLECTOMY      Allergies: Sulfa antibiotics  Medications: Prior to Admission medications   Medication Sig Start Date End Date Taking? Authorizing Provider  acetaminophen (TYLENOL) 500 MG tablet Take 500 mg by mouth at bedtime.    Yes [provider]  amLODipine (NORVASC) 2.5 MG tablet TAKE 1 TABLET BY MOUTH  DAILY 03/29/22  Yes [name], MD  aspirin 81 MG EC tablet Take 81 mg by mouth daily.    Yes [provider]  atorvastatin (LIPITOR) 20 MG tablet Take 1 tablet (20 mg total) by mouth daily. 11/09/22  Yes [name], MD  cholecalciferol (VITAMIN D) 25 MCG (1000 UNIT) tablet Take 1,000 Units by mouth daily. 02/24/20  Yes [provider]  diclofenac Sodium (VOLTAREN) 1 % GEL APPLY 4 GRAMS TOPICALLY 4  TIMES DAILY 04/01/21  Yes [name], MD  finasteride (PROSCAR) 5 MG tablet TAKE 1 TABLET BY MOUTH  DAILY 08/02/22  Yes [name], MD  lisinopril (ZESTRIL) 20 MG tablet TAKE 1 TABLET BY MOUTH  DAILY 03/29/22  Yes [name], MD  metoprolol tartrate (LOPRESSOR) 25 MG tablet TAKE ONE-HALF TABLET BY MOUTH  TWICE DAILY 08/21/22  Yes [name], MD  nitroGLYCERIN (NITROSTAT) 0.4  MG SL tablet Place 1 tablet (0.4 mg total) under the tongue every 5 (five) minutes as needed for chest pain. Maximum of 3 doses. 05/24/20  Yes Minna Merritts, MD  omeprazole (PRILOSEC) 20 MG capsule TAKE 2 CAPSULES BY MOUTH  DAILY 05/03/22  Yes [name], MD  oxybutynin (DITROPAN) 5 MG tablet TAKE 1 TABLET BY MOUTH  TWICE DAILY 03/29/22  Yes [name], MD  polyethylene glycol powder (GLYCOLAX/MIRALAX) powder Take 17 g by mouth once. Dissolve 1 capful of power into any liquid and drink once daily Patient taking  differently: Take 17 g by mouth at bedtime. Dissolve 1 capful of power into any liquid and drink once daily 11/01/15  Yes Julianne Rice, MD  rOPINIRole (REQUIP) 0.25 MG tablet TAKE 1 TABLET BY MOUTH AT  BEDTIME 03/29/22  Yes [name], MD  Simethicone Extra Strength 125 MG CAPS Take 125 mg by mouth as needed (gas).   Yes [provider]  tamsulosin (FLOMAX) 0.4 MG CAPS capsule TAKE 1 CAPSULE BY MOUTH  DAILY AFTER BREAKFAST 03/29/22  Yes [name], MD  Azelastine-Fluticasone 137-50 MCG/ACT SUSP Place 1 spray into both nostrils 2 (two) times daily as needed. Patient not taking: Reported on 12/27/2022 03/29/20   Bobbitt, [place] Muta, MD  Blood Pressure Monitoring (BLOOD PRESSURE MONITOR AUTOMAT) DEVI Use daily to check blood pressure 11/25/14   [name], MD  cyanocobalamin (,VITAMIN B-12,) 1000 MCG/ML injection INJECT 1 ML (1,000 MCG TOTAL) INTO THE MUSCLE ONCE A WEEK. FOR 4 WEEKS, THEN MONTHLY THEREAFTER Patient not taking: Reported on 12/27/2022 12/06/20   [name], MD  erythromycin ophthalmic ointment SMARTSIG:0.25 Sparingly In Eye(s) Every Night Patient not taking: Reported on 12/27/2022 11/03/21   [provider]  Syringe/Needle, Disp, (SYRINGE 3CC/25GX1") 25G X 1" 3 ML MISC Use for b12 injections 09/02/20   [name], MD     Family History  Problem Relation Age of Onset   Hyperlipidemia Mother    Hypertension Mother    Hypertension Father     Social History   Socioeconomic History   Marital status: Married    Spouse name: Not on file   Number of children: Not on file   Years of education: Not on file   Highest education level: Not on file  Occupational History   Not on file  Tobacco Use   Smoking status: Former    Types: Cigarettes    Quit date: 07/10/1963    Years since quitting: 59.5   Smokeless tobacco: Never  Vaping Use   Vaping Use: Never used  Substance and Sexual Activity   Alcohol use: No   Drug use: No   Sexual activity:  Not Currently  Other Topics Concern   Not on file  Social History Narrative   Not on file   Social Determinants of Health   Financial Resource Strain: Low Risk  (01/05/2022)   Overall Financial Resource Strain (CARDIA)    Difficulty of Paying Living Expenses: Not hard at all  Food Insecurity: No Food Insecurity (01/05/2022)   Hunger Vital Sign    Worried About Running Out of Food in the Last Year: Never true    [place] in the Last Year: Never true  Transportation Needs: No Transportation Needs (01/05/2022)   PRAPARE - Hydrologist (Medical): No    Lack of Transportation (Non-Medical): No  Physical Activity: Inactive (11/17/2020)   Exercise Vital Sign  Days of Exercise per Week: 0 days    Minutes of Exercise per Session: 0 min  Stress: No Stress Concern Present (01/05/2022)   [place]    Feeling of Stress : Not at all  Social Connections: Unknown (01/05/2022)   Social Connection and Isolation Panel [NHANES]    Frequency of Communication with Friends and Family: Not on file    Frequency of Social Gatherings with Friends and Family: Not on file    Attends Religious Services: Not on file    Active Member of Clubs or Organizations: Not on file    Attends Archivist Meetings: Not on file    Marital Status: Married    Review of Systems: A 12 point ROS discussed and pertinent positives are indicated in the HPI above.  All other systems are negative.  Review of Systems  Constitutional:  Negative for appetite change, chills and fever.  Respiratory:  Negative for shortness of breath.   Cardiovascular:  Positive for leg swelling. Negative for chest pain.  Gastrointestinal:  Negative for abdominal pain, nausea and vomiting.  Genitourinary:  Positive for flank pain.  Neurological:  Negative for weakness and headaches.    Vital Signs: BP (!) 106/46   Pulse 71   Temp (!) 101 F  (38.3 C) (Rectal)   Resp 18   Ht '5\' 10"'$  (1.778 m)   Wt 192 lb 7.4 oz (87.3 kg)   SpO2 96%   BMI 27.62 kg/m     Physical Exam Vitals reviewed.  Constitutional:      General: He is not in acute distress.    Appearance: He is ill-appearing.  HENT:     Mouth/Throat:     Mouth: Mucous membranes are dry.     Pharynx: Oropharynx is clear.  Cardiovascular:     Rate and Rhythm: Normal rate and regular rhythm.     Pulses: Normal pulses.     Heart sounds: Normal heart sounds.  Pulmonary:     Effort: Pulmonary effort is normal. No respiratory distress.     Breath sounds: Normal breath sounds.  Abdominal:     General: Bowel sounds are normal. There is no distension.     Palpations: Abdomen is soft.     Tenderness: There is no abdominal tenderness. There is no guarding.  Musculoskeletal:     Right lower leg: No edema.     Left lower leg: No edema.     Comments: Compression stockings in place to BLE  Skin:    General: Skin is warm and dry.  Neurological:     Mental Status: He is alert and oriented to person, place, and time.  Psychiatric:        Mood and Affect: Mood normal.        Behavior: Behavior normal.        Thought Content: Thought content normal.        Judgment: Judgment normal.     Imaging: US ABDOMEN LIMITED RUQ (LIVER/GB)  Result Date: 12/27/2022 CLINICAL DATA:  Right-sided abdominal pain since yesterday EXAM: ULTRASOUND ABDOMEN LIMITED RIGHT UPPER QUADRANT COMPARISON:  Ultrasound 11/17/2022.  CT 11/08/2022 and older FINDINGS: Gallbladder: Gallbladder is dilated with significant wall thickening measuring up to 8 mm. There also appears to be some wall edema. Gallstones are seen. Common bile duct: Diameter: 5 mm Liver: No focal lesion identified. Within normal limits in parenchymal echogenicity. Portal vein is patent on color Doppler imaging with normal direction of  blood flow towards the liver. Other: Portions of the liver obscured by overlapping bowel gas and soft  tissue. IMPRESSION: Dilated gallbladder with wall thickening and edema. Gallstones. Findings are worrisome for developing acute cholecystitis. Please correlate with symptoms. If needed for confirmation a HIDA scan can be performed as clinically directed. No ductal dilatation. Electronically Signed   By: Jill Side M.D.   On: 12/27/2022 12:49   CT Angio Chest PE W and/or Wo Contrast  Result Date: 12/27/2022 CLINICAL DATA:  Pulmonary embolism suspected, high probability. Right-sided chest pain and elevated white blood cell count. EXAM: CT ANGIOGRAPHY CHEST WITH CONTRAST TECHNIQUE: Multidetector CT imaging of the chest was performed using the standard protocol during bolus administration of intravenous contrast. Multiplanar CT image reconstructions and MIPs were obtained to evaluate the vascular anatomy. RADIATION DOSE REDUCTION: This exam was performed according to the departmental dose-optimization program which includes automated exposure control, adjustment of the mA and/or kV according to patient size and/or use of iterative reconstruction technique. CONTRAST:  87m OMNIPAQUE IOHEXOL 350 MG/ML SOLN COMPARISON:  Chest radiograph 12/27/2022. FINDINGS: Cardiovascular: Satisfactory opacification of the pulmonary arteries to the segmental level. No evidence of pulmonary embolism. Postoperative changes of median sternotomy and CABG. Atherosclerotic calcifications of the thoracic aorta and arch vessel origins. Normal heart size. No pericardial effusion. Mediastinum/Nodes: No mediastinal lymphadenopathy. Trachea is patent. Patulous upper thoracic esophagus. Lungs/Pleura: Low lung volumes with bibasilar and dependent atelectasis. Trace right pleural effusion. Upper Abdomen: Distended gallbladder with 2 radiopaque stones in the neck, wall thickening and pericholecystic fluid. Right-greater-than-left perinephric stranding. Atherosclerotic calcifications of the abdominal aorta and its branches. Musculoskeletal: No acute  rib fracture. Unchanged moderate compression deformity of the T12 vertebral body. Diffusely demineralized appearance of the bones. Review of the MIP images confirms the above findings. IMPRESSION: 1. No evidence of pulmonary embolism. 2. Distended gallbladder with 2 radiopaque stones in the neck, wall thickening and pericholecystic fluid. Findings are concerning for acute cholecystitis. Consider right upper quadrant ultrasound for further evaluation. 3. Low lung volumes with bibasilar and dependent atelectasis. Trace right pleural effusion. 4. Aortic Atherosclerosis (ICD10-I70.0). Electronically Signed   By: WEmmit AlexandersM.D.   On: 12/27/2022 11:34   DG Chest Port 1 View  Result Date: 12/27/2022 CLINICAL DATA:  Sepsis, lethargy EXAM: PORTABLE CHEST 1 VIEW COMPARISON:  12/15/2018 FINDINGS: Cardiomegaly. Prior sternotomy. Aortic atherosclerosis. Very low lung volumes with bibasilar opacities. Possible trace right pleural effusion. No pneumothorax. IMPRESSION: Very low lung volumes with bibasilar opacities, atelectasis versus pneumonia. Possible trace right pleural effusion. Electronically Signed   By: NDavina PokeD.O.   On: 12/27/2022 09:25    Labs:  CBC: Recent Labs    08/22/22 1009 12/27/22 0900  WBC 7.7 25.0*  HGB 13.7 13.8  HCT 42.1 41.0  PLT 203 192    COAGS: Recent Labs    12/27/22 0900  INR 1.2  APTT 34    BMP: Recent Labs    08/22/22 1009 10/24/22 1115 12/07/22 1303 12/27/22 0900  NA 143 142 142 136  K 4.1 4.1 3.8 2.9*  CL 108 106 105 105  CO2 '28 30 31 23  '$ GLUCOSE 78 82 80 124*  BUN '14 14 13 16  '$ CALCIUM 8.7* 9.0 9.1 8.0*  CREATININE 0.81 0.81 0.78 0.78  GFRNONAA >60  --   --  >60    LIVER FUNCTION TESTS: Recent Labs    08/22/22 1009 10/24/22 1115 12/07/22 1303 12/27/22 0900  BILITOT 0.6 0.6 0.5 1.3*  AST 25 21  24 26  ALT '21 21 20 20  '$ ALKPHOS 57 63 58 46  PROT 6.5 6.1 6.0 5.9*  ALBUMIN 3.7 3.9 3.9 3.3*    TUMOR MARKERS: No results for  input(s): "AFPTM", "CEA", "CA199", "CHROMGRNA" in the last 8760 hours.  Assessment and Plan:  89 yo male presents to IR for cholecystotomy drain placement for acute cholecystitis.   Pt resting on stretcher. He is A&O, calm and pleasant.  He is in no distress.  Last ASA yesterday. No other AC/AP. Per pt request, pt son was called and advised that cholecystostomy drain will be placed today.   Risks and benefits discussed with the patient including, but not limited to bleeding, infection, gallbladder perforation, bile leak, sepsis or even death.  All of the patient's questions were answered, patient is agreeable to proceed. Consent signed and in chart.  Thank you for this interesting consult.  I greatly enjoyed meeting Jessy Cybulski and look forward to participating in their care.  A copy of this report was sent to the requesting provider on this date.  Electronically Signed: Tyson Alias, NP 12/27/2022, 12:53 PM   I spent a total of 20 minutes in face to face in clinical consultation, greater than 50% of which was counseling/coordinating care for acute cholecystitis.

## 2022-12-27 NOTE — ED Notes (Signed)
Pt given cup of water per request, tolerated well.

## 2022-12-27 NOTE — Consult Note (Signed)
PHARMACY -  BRIEF ANTIBIOTIC NOTE   Pharmacy has received consult(s) for vancomycin and cefepime dosing from an ED provider.  The patient's profile has been reviewed for ht/wt/allergies/indication/available labs.    One time order(s) placed for Cefepime 2 grams IV and vancomycin 1750 mg IV  Further antibiotics/pharmacy consults should be ordered by admitting physician if indicated.                       Thank you, Lorin Picket, PharmD 12/27/2022  11:04 AM

## 2022-12-27 NOTE — ED Triage Notes (Signed)
Pt to ED via EMS for AMS. Per EMS pt has been lethargic since yesterday afternoon complaining of Right side pain and family states bilateral leg swelling. On arrival EMS states O2 at 89% RA.  BP 110/72 CBG 133 HR 70 Capnography 25

## 2022-12-27 NOTE — Progress Notes (Signed)
Elink following sepsis bundle. °

## 2022-12-27 NOTE — Consult Note (Signed)
MEDICATION-RELATED CONSULT NOTE   IR Procedure Consult - Anticoagulant/Antiplatelet PTA/Inpatient Med List Review by Pharmacist    Procedure: chole tube placement    Completed: 12/27/22 @ 9381  Post-Procedural bleeding risk per IR MD assessment:  Standard Risk  Antithrombotic medications on inpatient or PTA profile prior to procedure:   Enoxaparin 40 mg [place] every 24 hours and Aspirin 81 mg po daily    Recommended restart time per IR Post-Procedure Guidelines:  Standard risk, recommend restart enoxaparin Day + 1 (next AM), aspirin 81 mg not stopped.   Plan:    Resume enoxaparin 40 mg SubQ every 24 hours on 1/25 in the AM.

## 2022-12-27 NOTE — H&P (Signed)
History and Physical    David Atkins PYK:[id number] DOB: 04-21-1929 DOA: 12/27/2022  Referring MD/NP/PA:   PCP: [name], MD   Patient coming from:  The patient is coming from home.    Chief Complaint: right flank pain and fever  HPI: David Atkins is a 89 y.o. male with medical history significant of dCHF, hypertension, hyperlipidemia, COPD, CABG, GERD, BPH, BPPV, nonepileptic seizure (not taking medications), who presents with fever and right flank pain.  Per his daughter at bedside, patient has right flank pain for about one month. Pt had negative outpatient workup including normal liver function and negative ultrasound (I could not find report of outpatient ultrasound).  Associated with nausea, no vomiting.  The right flank pain is aching, mild, nonradiating, associated with nausea, no vomiting.  Patient has loose stool bowel movement which is most likely due to laxative use.  No active diarrhea.  Patient did not have fever or chills at home, but was found to have fever of 101.0 in ED.  Patient has shallow breathing, no cough, shortness breath.  Denies symptoms of UTI.  Patient has generalized weakness.  Patient is lethargic, but is oriented x 3.  No fall or injury.  Patient had 1 episode of oxygen desaturation to 89% on room air, which is improved to 96% on room air currently.  No respiratory distress.   Data reviewed independently and ED Course: pt was found to have WBC 25.0, INR 1.2, PTT 34, normal liver function, negative PCR for COVID and flu, GFR> 60, potassium 2.9, lactic acid 1.5, BNP  723. Blood pressure 158/87, heart rate 89, RR 25, 18, oxygen saturation 96% on room air.  Chest x-ray showed low volume and bilateral basilar opacity.  CTA negative for PE, showed acute cholecystitis.  Patient is admitted to PCU as inpatient.  Dr. Dahlia Byes of surgery is consulted.  CTA showed: 1. No evidence of pulmonary embolism. 2. Distended gallbladder with 2 radiopaque stones in the neck,  wall thickening and pericholecystic fluid. Findings are concerning for acute cholecystitis. Consider right upper quadrant ultrasound for further evaluation. 3. Low lung volumes with bibasilar and dependent atelectasis. Trace right pleural effusion. 4. Aortic Atherosclerosis (ICD10-I70.0).   EKG: I have personally reviewed.  Sinus rhythm, QTc 414, early R wave progression, artificial effects   Review of Systems:   General: no fevers, chills, no body weight gain, has fatigue HEENT: no blurry vision, hearing changes or sore throat Respiratory: no dyspnea, coughing, wheezing CV: no chest pain, no palpitations GI: has nausea. Has loose stool bowel movement. GU: no dysuria, burning on urination, increased urinary frequency, hematuria  Ext: has trace leg edema Neuro: no unilateral weakness, numbness, or tingling, no vision change or hearing loss. Has lethargy Skin: no rash, no skin tear. MSK: No muscle spasm, no deformity, no limitation of range of movement in spin Heme: No easy bruising.  Travel history: No recent long distant travel.   Allergy:  Allergies  Allergen Reactions   Sulfa Antibiotics Rash    Past Medical History:  Diagnosis Date   3-vessel coronary artery disease 2006   Chical   basal cell    Basal cell carcinoma 07/09/2012   BPH (benign prostatic hyperplasia)    Concussion w loss of consciousness of unsp duration, subs 12/29/2018   GERD (gastroesophageal reflux disease)    Hyperlipidemia    Hypertension    Lumbar stenosis 12/03/2013   MRI - lumbar pain with radiation down to LE   Myocardial infarction [facility]) 2006  OAB (overactive bladder)    Osteoporosis 2010   by DEXA at [facility] apnea    Spondylolisthesis of lumbar region 11/23/2013   MRI Lumbar spine    Past Surgical History:  Procedure Laterality Date   APPENDECTOMY  1951   COLONOSCOPY     COLONOSCOPY WITH PROPOFOL N/A 11/15/2015   Procedure: COLONOSCOPY WITH PROPOFOL;  Surgeon: Hulen Luster,  MD;  Location: [facility] ENDOSCOPY;  Service: Gastroenterology;  Laterality: N/A;   CORONARY ARTERY BYPASS GRAFT  2006   3 vessel, s/p AMI   NASAL SEPTOPLASTY W/ TURBINOPLASTY  1981   TONSILLECTOMY      Social History:  reports that he quit smoking about 59 years ago. His smoking use included cigarettes. He has never used smokeless tobacco. He reports that he does not drink alcohol and does not use drugs.  Family History:  Family History  Problem Relation Age of Onset   Hyperlipidemia Mother    Hypertension Mother    Hypertension Father      Prior to Admission medications   Medication Sig Start Date End Date Taking? Authorizing Provider  acetaminophen (TYLENOL) 500 MG tablet Take 500 mg by mouth at bedtime.     [provider]  amLODipine (NORVASC) 2.5 MG tablet TAKE 1 TABLET BY MOUTH  DAILY 03/29/22   [name], MD  aspirin 81 MG EC tablet Take 81 mg by mouth daily.     [provider]  atorvastatin (LIPITOR) 20 MG tablet Take 1 tablet (20 mg total) by mouth daily. 11/09/22   [name], MD  Azelastine-Fluticasone 137-50 MCG/ACT SUSP Place 1 spray into both nostrils 2 (two) times daily as needed. 03/29/20   Bobbitt, [place] Muta, MD  Blood Pressure Monitoring (BLOOD PRESSURE MONITOR AUTOMAT) DEVI Use daily to check blood pressure 11/25/14   [name], MD  cholecalciferol (VITAMIN D) 25 MCG (1000 UNIT) tablet  02/24/20   [provider]  cyanocobalamin (,VITAMIN B-12,) 1000 MCG/ML injection INJECT 1 ML (1,000 MCG TOTAL) INTO THE MUSCLE ONCE A WEEK. FOR 4 WEEKS, THEN MONTHLY THEREAFTER 12/06/20   [name], MD  diclofenac Sodium (VOLTAREN) 1 % GEL APPLY 4 GRAMS TOPICALLY 4  TIMES DAILY 04/01/21   [name], MD  erythromycin ophthalmic ointment SMARTSIG:0.25 Sparingly In Eye(s) Every Night 11/03/21   [provider]  finasteride (PROSCAR) 5 MG tablet TAKE 1 TABLET BY MOUTH  DAILY 08/02/22   [name], MD  lisinopril (ZESTRIL) 20 MG  tablet TAKE 1 TABLET BY MOUTH  DAILY 03/29/22   [name], MD  metoprolol tartrate (LOPRESSOR) 25 MG tablet TAKE ONE-HALF TABLET BY MOUTH  TWICE DAILY 08/21/22   [name], MD  nitroGLYCERIN (NITROSTAT) 0.4 MG SL tablet Place 1 tablet (0.4 mg total) under the tongue every 5 (five) minutes as needed for chest pain. Maximum of 3 doses. 05/24/20   Minna Merritts, MD  omeprazole (PRILOSEC) 20 MG capsule TAKE 2 CAPSULES BY MOUTH  DAILY 05/03/22   [name], MD  oxybutynin (DITROPAN) 5 MG tablet TAKE 1 TABLET BY MOUTH  TWICE DAILY 03/29/22   [name], MD  polyethylene glycol powder (GLYCOLAX/MIRALAX) powder Take 17 g by mouth once. Dissolve 1 capful of power into any liquid and drink once daily Patient taking differently: Take 17 g by mouth at bedtime. Dissolve 1 capful of power into any liquid and drink once daily 11/01/15   Julianne Rice, MD  rOPINIRole (  REQUIP) 0.25 MG tablet TAKE 1 TABLET BY MOUTH AT  BEDTIME 03/29/22   [name], MD  Simethicone Extra Strength 125 MG CAPS Take 125 mg by mouth as needed (gas).    [provider]  Syringe/Needle, Disp, (SYRINGE 3CC/25GX1") 25G X 1" 3 ML MISC Use for b12 injections 09/02/20   [name], MD  tamsulosin (FLOMAX) 0.4 MG CAPS capsule TAKE 1 CAPSULE BY MOUTH  DAILY AFTER BREAKFAST 03/29/22   [name], MD    Physical Exam: Vitals:   12/27/22 0856 12/27/22 0915 12/27/22 1025 12/27/22 1230  BP:  (!) 118/53 (!) 158/87 (!) 106/46  Pulse:  73 89 71  Resp:  18 18   Temp:      TempSrc:      SpO2:  94% 96% 96%  Weight: 87.3 kg     Height: '5\' 10"'$  (1.778 m)      General: Not in acute distress HEENT:       Eyes: PERRL, EOMI, no scleral icterus.       ENT: No discharge from the ears and nose, no pharynx injection, no tonsillar enlargement.        Neck: No JVD, no bruit, no mass felt. Heme: No neck lymph node enlargement. Cardiac: S1/S2, RRR, No murmurs, No gallops or rubs. Respiratory: No rales, wheezing,  rhonchi or rubs. GI: Soft, nondistended, no rebound pain, no organomegaly, BS present. Has tenderness in right abdomen and flank area. GU: No hematuria Ext: has trace leg edema bilaterally. 1+DP/PT pulse bilaterally. Musculoskeletal: No joint deformities, No joint redness or warmth, no limitation of ROM in spin. Skin: No rashes.  Neuro: Alert, oriented X3, cranial nerves II-XII grossly intact, moves all extremities normally.  Psych: Patient is not psychotic, no suicidal or hemocidal ideation.  Labs on Admission: I have personally reviewed following labs and imaging studies  CBC: Recent Labs  Lab 12/27/22 0900  WBC 25.0*  NEUTROABS 22.3*  HGB 13.8  HCT 41.0  MCV 96.0  PLT 212   Basic Metabolic Panel: Recent Labs  Lab 12/27/22 0900  NA 136  K 2.9*  CL 105  CO2 23  GLUCOSE 124*  BUN 16  CREATININE 0.78  CALCIUM 8.0*  MG 1.9  PHOS 3.2   GFR: Estimated Creatinine Clearance: 62.1 mL/min (by C-G formula based on SCr of 0.78 mg/dL). Liver Function Tests: Recent Labs  Lab 12/27/22 0900  AST 26  ALT 20  ALKPHOS 46  BILITOT 1.3*  PROT 5.9*  ALBUMIN 3.3*   No results for input(s): "LIPASE", "AMYLASE" in the last 168 hours. No results for input(s): "AMMONIA" in the last 168 hours. Coagulation Profile: Recent Labs  Lab 12/27/22 0900  INR 1.2   Cardiac Enzymes: No results for input(s): "CKTOTAL", "CKMB", "CKMBINDEX", "TROPONINI" in the last 168 hours. BNP (last 3 results) No results for input(s): "PROBNP" in the last 8760 hours. HbA1C: No results for input(s): "HGBA1C" in the last 72 hours. CBG: No results for input(s): "GLUCAP" in the last 168 hours. Lipid Profile: No results for input(s): "CHOL", "HDL", "LDLCALC", "TRIG", "CHOLHDL", "LDLDIRECT" in the last 72 hours. Thyroid Function Tests: No results for input(s): "TSH", "T4TOTAL", "FREET4", "T3FREE", "THYROIDAB" in the last 72 hours. Anemia Panel: No results for input(s): "VITAMINB12", "FOLATE", "FERRITIN",  "TIBC", "IRON", "RETICCTPCT" in the last 72 hours. Urine analysis:    Component Value Date/Time   COLORURINE YELLOW 03/14/2021 1556   APPEARANCEUR CLEAR 03/14/2021 1556   APPEARANCEUR Clear 03/30/2017 1457   LABSPEC 1.016  03/14/2021 1556   PHURINE < OR = 5.0 03/14/2021 1556   GLUCOSEU NEGATIVE 03/14/2021 1556   GLUCOSEU NEGATIVE 08/22/2018 1450   HGBUR NEGATIVE 03/14/2021 1556   BILIRUBINUR negative 10/13/2022 1639   BILIRUBINUR Negative 03/30/2017 1457   KETONESUR negative 10/13/2022 [place] 03/14/2021 1556   PROTEINUR trace (A) 10/13/2022 1639   PROTEINUR NEGATIVE 03/14/2021 1556   UROBILINOGEN 1.0 10/13/2022 1639   UROBILINOGEN 0.2 08/22/2018 1450   NITRITE Negative 10/13/2022 1639   NITRITE NEGATIVE 03/14/2021 1556   LEUKOCYTESUR Negative 10/13/2022 1639   LEUKOCYTESUR NEGATIVE 03/14/2021 1556   Sepsis Labs: '@LABRCNTIP'$ (procalcitonin:4,lacticidven:4) ) Recent Results (from the past 240 hour(s))  Resp Panel by RT-PCR (Flu A&B, Covid) Anterior Nasal Swab     Status: None   Collection Time: 12/27/22  9:01 AM   Specimen: Anterior Nasal Swab  Result Value Ref Range Status   SARS Coronavirus 2 by RT PCR NEGATIVE NEGATIVE Final    Comment: (NOTE) SARS-CoV-2 target nucleic acids are NOT DETECTED.  The SARS-CoV-2 RNA is generally detectable in upper respiratory specimens during the acute phase of infection. The lowest concentration of SARS-CoV-2 viral copies this assay can detect is 138 copies/mL. A negative result does not preclude SARS-Cov-2 infection and should not be used as the sole basis for treatment or other patient management decisions. A negative result may occur with  improper specimen collection/handling, submission of specimen other than nasopharyngeal swab, presence of viral mutation(s) within the areas targeted by this assay, and inadequate number of viral copies(<138 copies/mL). A negative result must be combined with clinical observations,  patient history, and epidemiological information. The expected result is Negative.  Fact Sheet for Patients:  EntrepreneurPulse.com.au  Fact Sheet for Healthcare Providers:  IncredibleEmployment.be  This test is no t yet approved or cleared by the [place] FDA and  has been authorized for detection and/or diagnosis of SARS-CoV-2 by FDA under an Emergency Use Authorization (EUA). This EUA will remain  in effect (meaning this test can be used) for the duration of the COVID-19 declaration under Section 564(b)(1) of the Act, 21 U.S.C.section 360bbb-3(b)(1), unless the authorization is terminated  or revoked sooner.       Influenza A by PCR NEGATIVE NEGATIVE Final   Influenza B by PCR NEGATIVE NEGATIVE Final    Comment: (NOTE) The Xpert Xpress SARS-CoV-2/FLU/RSV plus assay is intended as an aid in the diagnosis of influenza from Nasopharyngeal swab specimens and should not be used as a sole basis for treatment. Nasal washings and aspirates are unacceptable for Xpert Xpress SARS-CoV-2/FLU/RSV testing.  Fact Sheet for Patients: EntrepreneurPulse.com.au  Fact Sheet for Healthcare Providers: IncredibleEmployment.be  This test is not yet approved or cleared by the [place] FDA and has been authorized for detection and/or diagnosis of SARS-CoV-2 by FDA under an Emergency Use Authorization (EUA). This EUA will remain in effect (meaning this test can be used) for the duration of the COVID-19 declaration under Section 564(b)(1) of the Act, 21 U.S.C. section 360bbb-3(b)(1), unless the authorization is terminated or revoked.  Performed at [facility], [address], [place], [place] [address]      Radiological Exams on Admission: CT Angio Chest PE W and/or Wo Contrast  Result Date: 12/27/2022 CLINICAL DATA:  Pulmonary embolism suspected, high probability. Right-sided chest pain and elevated  white blood cell count. EXAM: CT ANGIOGRAPHY CHEST WITH CONTRAST TECHNIQUE: Multidetector CT imaging of the chest was performed using the standard protocol during bolus administration of intravenous contrast. Multiplanar  CT image reconstructions and MIPs were obtained to evaluate the vascular anatomy. RADIATION DOSE REDUCTION: This exam was performed according to the departmental dose-optimization program which includes automated exposure control, adjustment of the mA and/or kV according to patient size and/or use of iterative reconstruction technique. CONTRAST:  9m OMNIPAQUE IOHEXOL 350 MG/ML SOLN COMPARISON:  Chest radiograph 12/27/2022. FINDINGS: Cardiovascular: Satisfactory opacification of the pulmonary arteries to the segmental level. No evidence of pulmonary embolism. Postoperative changes of median sternotomy and CABG. Atherosclerotic calcifications of the thoracic aorta and arch vessel origins. Normal heart size. No pericardial effusion. Mediastinum/Nodes: No mediastinal lymphadenopathy. Trachea is patent. Patulous upper thoracic esophagus. Lungs/Pleura: Low lung volumes with bibasilar and dependent atelectasis. Trace right pleural effusion. Upper Abdomen: Distended gallbladder with 2 radiopaque stones in the neck, wall thickening and pericholecystic fluid. Right-greater-than-left perinephric stranding. Atherosclerotic calcifications of the abdominal aorta and its branches. Musculoskeletal: No acute rib fracture. Unchanged moderate compression deformity of the T12 vertebral body. Diffusely demineralized appearance of the bones. Review of the MIP images confirms the above findings. IMPRESSION: 1. No evidence of pulmonary embolism. 2. Distended gallbladder with 2 radiopaque stones in the neck, wall thickening and pericholecystic fluid. Findings are concerning for acute cholecystitis. Consider right upper quadrant ultrasound for further evaluation. 3. Low lung volumes with bibasilar and dependent  atelectasis. Trace right pleural effusion. 4. Aortic Atherosclerosis (ICD10-I70.0). Electronically Signed   By: WEmmit AlexandersM.D.   On: 12/27/2022 11:34   DG Chest Port 1 View  Result Date: 12/27/2022 CLINICAL DATA:  Sepsis, lethargy EXAM: PORTABLE CHEST 1 VIEW COMPARISON:  12/15/2018 FINDINGS: Cardiomegaly. Prior sternotomy. Aortic atherosclerosis. Very low lung volumes with bibasilar opacities. Possible trace right pleural effusion. No pneumothorax. IMPRESSION: Very low lung volumes with bibasilar opacities, atelectasis versus pneumonia. Possible trace right pleural effusion. Electronically Signed   By: NDavina PokeD.O.   On: 12/27/2022 09:25      Assessment/Plan Active Problems:   Acute cholecystitis   Sepsis (HCC)   Chronic diastolic CHF (congestive heart failure) (HCC)   Hypertension   Hyperlipidemia LDL goal <70   CAD (coronary artery disease)   Hypokalemia   BPH (benign prostatic hyperplasia)   OSA on CPAP   Assessment and Plan:  Sepsis due to acute cholecystitis: Patient has sepsis with WBC 25.0, fever of 101.0, tachypnea with RR up to 25.  Lactic acid normal 1.5.   Initially suspected pneumonia in ED, was given vancomycin and cefepime. CTA is negative for PE, and showed acute cholecystitis.  Patient does not have cough or shortness of breath, clinically does not seem to have pneumonia. Consulted Dr. PDahlia Byesof surgery --> IR will do perc chole.  -will admit to PCU as inpt -switched Abx to Zosyn -f/u Bx -will get Procalcitonin -IVF: 75 cc/h of NS ((patient has dCHF and elevated BNP 723, limiting aggressive IV fluids treatment). -As needed morphine, Percocet, Tylenol for pain -prn zofran  Chronic diastolic CHF (congestive heart failure) ([place]: 2D echo on 09/27/2022 showed EF of 60 to 65%.  Patient has elevated BNP 723, but only has trace leg edema.  Had 1 episode of oxygen desaturation, currently oxygen saturation 96% on room air.  Does not seem to have obvious CHF  exacerbation.  Due to sepsis, will not start diuretics. -Watch volume status closely  Hypertension -IV hydralazine as needed -Hold amlodipine and lisinopril since patient is at high risk of developing hypotension due to sepsis -Continue home metoprolol  Hyperlipidemia LDL goal <70 -lipitor  CAD (coronary artery disease): -  ASA and lipitor  Hypokalemia: K 2.9 -Repleted potassium -Check magnesium level --> 1.9 -Check phosphorus level --> 3.2  BPH (benign prostatic hyperplasia): -Flomax, oxybutynin, Proscar  OSA - on CPAP        DVT ppx: SCD     Code Status: DNR (I discussed with patient in the presence of his daughter, and explained the meaning of CODE STATUS. Patient wants to be DNR)  Family Communication:   Yes, patient's daughter at bed side.     Disposition Plan:  Anticipate discharge back to previous environment  Consults called:  Dr. Dahlia Byes of surgery  Admission status and Level of care: Progressive:    as inpt       Dispo: The patient is from: Home              Anticipated d/c is to: Home              Anticipated d/c date is: 2 days              Patient currently is not medically stable to d/c.    Severity of Illness:  The appropriate patient status for this patient is INPATIENT. Inpatient status is judged to be reasonable and necessary in order to provide the required intensity of service to ensure the patient's safety. The patient's presenting symptoms, physical exam findings, and initial radiographic and laboratory data in the context of their chronic comorbidities is felt to place them at high risk for further clinical deterioration. Furthermore, it is not anticipated that the patient will be medically stable for discharge from the hospital within 2 midnights of admission.   * I certify that at the point of admission it is my clinical judgment that the patient will require inpatient hospital care spanning beyond 2 midnights from the point of admission due  to high intensity of service, high risk for further deterioration and high frequency of surveillance required.*       Date of Service 12/27/2022    Ivor Costa [facility]   If 7PM-7AM, please contact night-coverage [email or web address] 12/27/2022, 12:47 PM

## 2022-12-27 NOTE — ED Notes (Signed)
Pt states "can't take pills very well." Told pt we can crush pills and mix with applesauce.

## 2022-12-27 NOTE — Progress Notes (Signed)
CODE SEPSIS - PHARMACY COMMUNICATION  **Broad Spectrum Antibiotics should be administered within 1 hour of Sepsis diagnosis**  Time Code Sepsis Called/Page Received: 1051  Antibiotics Ordered: cefepime and vancomycin  Time of 1st antibiotic administration: 1141  Additional action taken by pharmacy: N/A   Lorin Picket ,PharmD Clinical Pharmacist  12/27/2022  11:06 AM

## 2022-12-27 NOTE — Consult Note (Signed)
[facility] SURGICAL CONSULTATION NOTE (initial) - cpt: 40981   HISTORY OF PRESENT ILLNESS (HPI):  89 y.o. male presented to [facility] ED today for evaluation of right rib pain. Looking back on chart review it seems patient may have been having RUQ vs right lower chest pain for some time now. Patient';s daughter reports this has been going on intermittently over the last month. It seems he has followed up with PCP for this and underwent CT on 11/08/2022 and [place] on 11/17/2022, which I have reviewed showing cholelithiasis but certainly no evidence of cholecystitis. He presents to the ED today for continued right sided chest pain as well as increasing fatigue and AMS. No reported fevers, SOB, emesis, or bowel changes. Previous abdominal surgeries positive for appendectomy. Work up in the ED revealed a leukocytosis to 25.0K and hypokalemia to 2.9 but labs were otherwise reassuring. He did have a CT Chest/Abdomen/Pelvis which ultimately showed a distended gallbladder, much worse compared to imagining December, and possible pericholecystic inflammation. Admitted to the medicine service   Surgery is consulted by emergency medicine physician Dr. Delman Kitten, MD in this context for evaluation and management of possible cholecystitis.   PAST MEDICAL HISTORY (PMH):  Past Medical History:  Diagnosis Date   3-vessel coronary artery disease 2006   [place]   basal cell    Basal cell carcinoma 07/09/2012   BPH (benign prostatic hyperplasia)    Concussion w loss of consciousness of unsp duration, subs 12/29/2018   GERD (gastroesophageal reflux disease)    Hyperlipidemia    Hypertension    Lumbar stenosis 12/03/2013   MRI - lumbar pain with radiation down to LE   Myocardial infarction [facility]) 2006   OAB (overactive bladder)    Osteoporosis 2010   by DEXA at [place] apnea    Spondylolisthesis of lumbar region 11/23/2013   MRI Lumbar spine     PAST SURGICAL HISTORY [facility]):  Past Surgical  History:  Procedure Laterality Date   APPENDECTOMY  1951   COLONOSCOPY     COLONOSCOPY WITH PROPOFOL N/A 11/15/2015   Procedure: COLONOSCOPY WITH PROPOFOL;  Surgeon: Hulen Luster, MD;  Location: [facility] ENDOSCOPY;  Service: Gastroenterology;  Laterality: N/A;   CORONARY ARTERY BYPASS GRAFT  2006   3 vessel, s/p AMI   NASAL SEPTOPLASTY W/ TURBINOPLASTY  1981   TONSILLECTOMY       MEDICATIONS:  Prior to Admission medications   Medication Sig Start Date End Date Taking? Authorizing Provider  acetaminophen (TYLENOL) 500 MG tablet Take 500 mg by mouth at bedtime.    Yes [provider]  amLODipine (NORVASC) 2.5 MG tablet TAKE 1 TABLET BY MOUTH  DAILY 03/29/22  Yes [name], MD  aspirin 81 MG EC tablet Take 81 mg by mouth daily.    Yes [provider]  atorvastatin (LIPITOR) 20 MG tablet Take 1 tablet (20 mg total) by mouth daily. 11/09/22  Yes [name], MD  cholecalciferol (VITAMIN D) 25 MCG (1000 UNIT) tablet Take 1,000 Units by mouth daily. 02/24/20  Yes [provider]  diclofenac Sodium (VOLTAREN) 1 % GEL APPLY 4 GRAMS TOPICALLY 4  TIMES DAILY 04/01/21  Yes [name], MD  finasteride (PROSCAR) 5 MG tablet TAKE 1 TABLET BY MOUTH  DAILY 08/02/22  Yes [name], MD  lisinopril (ZESTRIL) 20 MG tablet TAKE 1 TABLET BY MOUTH  DAILY 03/29/22  Yes [name], MD  metoprolol tartrate (LOPRESSOR) 25 MG tablet TAKE ONE-HALF  TABLET BY MOUTH  TWICE DAILY 08/21/22  Yes [name], MD  nitroGLYCERIN (NITROSTAT) 0.4 MG SL tablet Place 1 tablet (0.4 mg total) under the tongue every 5 (five) minutes as needed for chest pain. Maximum of 3 doses. 05/24/20  Yes Minna Merritts, MD  omeprazole (PRILOSEC) 20 MG capsule TAKE 2 CAPSULES BY MOUTH  DAILY 05/03/22  Yes [name], MD  oxybutynin (DITROPAN) 5 MG tablet TAKE 1 TABLET BY MOUTH  TWICE DAILY 03/29/22  Yes [name], MD  polyethylene glycol powder (GLYCOLAX/MIRALAX) powder Take 17 g by mouth once.  Dissolve 1 capful of power into any liquid and drink once daily Patient taking differently: Take 17 g by mouth at bedtime. Dissolve 1 capful of power into any liquid and drink once daily 11/01/15  Yes Julianne Rice, MD  rOPINIRole (REQUIP) 0.25 MG tablet TAKE 1 TABLET BY MOUTH AT  BEDTIME 03/29/22  Yes [name], MD  Simethicone Extra Strength 125 MG CAPS Take 125 mg by mouth as needed (gas).   Yes [provider]  tamsulosin (FLOMAX) 0.4 MG CAPS capsule TAKE 1 CAPSULE BY MOUTH  DAILY AFTER BREAKFAST 03/29/22  Yes [name], MD  Azelastine-Fluticasone 137-50 MCG/ACT SUSP Place 1 spray into both nostrils 2 (two) times daily as needed. Patient not taking: Reported on 12/27/2022 03/29/20   Bobbitt, [place] Muta, MD  Blood Pressure Monitoring (BLOOD PRESSURE MONITOR AUTOMAT) DEVI Use daily to check blood pressure 11/25/14   [name], MD  cyanocobalamin (,VITAMIN B-12,) 1000 MCG/ML injection INJECT 1 ML (1,000 MCG TOTAL) INTO THE MUSCLE ONCE A WEEK. FOR 4 WEEKS, THEN MONTHLY THEREAFTER Patient not taking: Reported on 12/27/2022 12/06/20   [name], MD  erythromycin ophthalmic ointment SMARTSIG:0.25 Sparingly In Eye(s) Every Night Patient not taking: Reported on 12/27/2022 11/03/21   [provider]  Syringe/Needle, Disp, (SYRINGE 3CC/25GX1") 25G X 1" 3 ML MISC Use for b12 injections 09/02/20   [name], MD     ALLERGIES:  Allergies  Allergen Reactions   Sulfa Antibiotics Rash     SOCIAL HISTORY:  Social History   Socioeconomic History   Marital status: Married    Spouse name: Not on file   Number of children: Not on file   Years of education: Not on file   Highest education level: Not on file  Occupational History   Not on file  Tobacco Use   Smoking status: Former    Types: Cigarettes    Quit date: 07/10/1963    Years since quitting: 59.5   Smokeless tobacco: Never  Vaping Use   Vaping Use: Never used  Substance and Sexual Activity    Alcohol use: No   Drug use: No   Sexual activity: Not Currently  Other Topics Concern   Not on file  Social History Narrative   Not on file   Social Determinants of Health   Financial Resource Strain: Low Risk  (01/05/2022)   Overall Financial Resource Strain (CARDIA)    Difficulty of Paying Living Expenses: Not hard at all  Food Insecurity: No Food Insecurity (01/05/2022)   Hunger Vital Sign    Worried About Running Out of Food in the Last Year: Never true    [place] in the Last Year: Never true  Transportation Needs: No Transportation Needs (01/05/2022)   PRAPARE - Hydrologist (Medical): No    Lack of Transportation (Non-Medical): No  Physical Activity:  Inactive (11/17/2020)   Exercise Vital Sign    Days of Exercise per Week: 0 days    Minutes of Exercise per Session: 0 min  Stress: No Stress Concern Present (01/05/2022)   [place]    Feeling of Stress : Not at all  Social Connections: Unknown (01/05/2022)   Social Connection and Isolation Panel [NHANES]    Frequency of Communication with Friends and Family: Not on file    Frequency of Social Gatherings with Friends and Family: Not on file    Attends Religious Services: Not on file    Active Member of Clubs or Organizations: Not on file    Attends Archivist Meetings: Not on file    Marital Status: Married  Intimate Partner Violence: Not At Risk (01/05/2022)   Humiliation, Afraid, Rape, and Kick questionnaire    Fear of Current or Ex-Partner: No    Emotionally Abused: No    Physically Abused: No    Sexually Abused: No     FAMILY HISTORY:  Family History  Problem Relation Age of Onset   Hyperlipidemia Mother    Hypertension Mother    Hypertension Father       REVIEW OF SYSTEMS:  Review of Systems  Constitutional:  Positive for malaise/fatigue. Negative for chills and fever.  Respiratory:  Negative for cough  and shortness of breath.   Cardiovascular:  Positive for chest pain. Negative for palpitations and orthopnea.  Gastrointestinal:  Positive for abdominal pain and nausea. Negative for constipation, diarrhea and vomiting.  Genitourinary:  Negative for dysuria and urgency.  All other systems reviewed and are negative.   VITAL SIGNS:  Temp:  [101 F (38.3 C)] 101 F (38.3 C) (01/24 0853) Pulse Rate:  [73-89] 89 (01/24 1025) Resp:  [18-25] 18 (01/24 1025) BP: (118-163)/(53-87) 158/87 (01/24 1025) SpO2:  [94 %-96 %] 96 % (01/24 1025) Weight:  [87.3 kg] 87.3 kg (01/24 0856)     Height: '5\' 10"'$  (177.8 cm) Weight: 87.3 kg BMI (Calculated): 27.62   INTAKE/OUTPUT:  No intake/output data recorded.  PHYSICAL EXAM:  Physical Exam Vitals and nursing note reviewed. Exam conducted with a chaperone present.  Constitutional:      General: He is not in acute distress.    Appearance: He is obese. He is not ill-appearing.     Comments: Patient is alert; NAD. Patient's daughter at bedside  HENT:     Head: Normocephalic and atraumatic.  Eyes:     General: No scleral icterus.    Conjunctiva/sclera: Conjunctivae normal.  Cardiovascular:     Rate and Rhythm: Normal rate and regular rhythm.     Pulses: Normal pulses.     Heart sounds: No murmur heard. Pulmonary:     Effort: Pulmonary effort is normal. No respiratory distress.  Abdominal:     General: Abdomen is protuberant. A surgical scar is present. There is no distension.     Palpations: Abdomen is soft.     Tenderness: There is abdominal tenderness in the right upper quadrant. There is no guarding or rebound. Negative signs include Murphy's sign.     Comments: Abdomen is soft, he denied any tenderness but winces with palpation in RUQ, negative Murphy's Sign, non-distended, no rebound/guarding.   Genitourinary:    Comments: Deferred Musculoskeletal:     Right lower leg: No edema.     Left lower leg: No edema.  Skin:    General: Skin is warm  and dry.  Coloration: Skin is not jaundiced or pale.  Neurological:     General: No focal deficit present.     Mental Status: He is alert and oriented to person, place, and time.  Psychiatric:        Mood and Affect: Mood normal.        Behavior: Behavior normal.      Labs:     Latest Ref Rng & Units 12/27/2022    9:00 AM 08/22/2022   10:09 AM 08/31/2021    2:26 PM  CBC  WBC 4.0 - 10.5 K/uL 25.0  7.7  7.3   Hemoglobin 13.0 - 17.0 g/dL 13.8  13.7  14.7   Hematocrit 39.0 - 52.0 % 41.0  42.1  45.0   Platelets 150 - 400 K/uL 192  203  203.0       Latest Ref Rng & Units 12/27/2022    9:00 AM 12/07/2022    1:03 PM 10/24/2022   11:15 AM  CMP  Glucose 70 - 99 mg/dL 124  80  82   BUN 8 - 23 mg/dL '16  13  14   '$ Creatinine 0.61 - 1.24 mg/dL 0.78  0.78  0.81   Sodium 135 - 145 mmol/L 136  142  142   Potassium 3.5 - 5.1 mmol/L 2.9  3.8  4.1   Chloride 98 - 111 mmol/L 105  105  106   CO2 22 - 32 mmol/L '23  31  30   '$ Calcium 8.9 - 10.3 mg/dL 8.0  9.1  9.0   Total Protein 6.5 - 8.1 g/dL 5.9  6.0  6.1   Total Bilirubin 0.3 - 1.2 mg/dL 1.3  0.5  0.6   Alkaline Phos 38 - 126 U/L 46  58  63   AST 15 - 41 U/L '26  24  21   '$ ALT 0 - 44 U/L '20  20  21      '$ Imaging studies:   CT Chest/Abdomen/Pelvis (12/27/2021) personally reviewed showing distended gallbladder, worse when compared to imaging in December, questionable surrounding inflammation, known stone, and radiologist report reviewed below:  IMPRESSION: 1. No evidence of pulmonary embolism. 2. Distended gallbladder with 2 radiopaque stones in the neck, wall thickening and pericholecystic fluid. Findings are concerning for acute cholecystitis. Consider right upper quadrant ultrasound for further evaluation. 3. Low lung volumes with bibasilar and dependent atelectasis. Trace right pleural effusion. 4. Aortic Atherosclerosis (ICD10-I70.0).   Assessment/Plan: (ICD-10's: K81.0) 89 y.o. male with RUQ vs right lower rib pain and  leukocytosis found to have liekly acute cholecystitis, complicated by pertinent comorbidities including advanced age and poor functional status.   - Given his advanced age and poor overall functional status, he is a sub-optimal surgical candidate. As such, I discussed with IR regarding placement of percutaneous cholecystostomy tube placement; they are in agreement. Appreciate their help.   - NPO + IVF Support - Replete K+; monitor - Continue IV Abx - Monitor abdominal examination; on-going bowel function    - Monitor leukocytosis - Pain control prn; antiemetics prn   - Further management per primary service; we will follow   All of the above findings and recommendations were discussed with the patient and his family (daughter at bedside), and all of their questions were answered to their expressed satisfaction.  Thank you for the opportunity to participate in this patient's care.   -- Edison Simon, PA-C [facility] 12/27/2022, 11:49 AM M-F: 7am - 4pm

## 2022-12-27 NOTE — ED Notes (Signed)
MD Blaine Hamper notified of pt little urinary output. Pt daughter states he has not urinated much but he is also not drinking fluids. OR stated he had output when in OR.

## 2022-12-27 NOTE — Consult Note (Signed)
Pharmacy Antibiotic Note  Kelvis Berger is a 89 y.o. male admitted on 12/27/2022 with  acute cholecystitis .  Pharmacy has been consulted for Zosyn dosing.  1/24 CT: "Distended gallbladder with 2 radiopaque stones in the neck, wall thickening and pericholecystic fluid. Findings are concerning for acute cholecystitis."  WBC = 25, Tmax = 101  Plan: Zosyn 3.375g IV q8h (4 hour infusion). Follow renal function for adjustments  Height: '5\' 10"'$  (177.8 cm) Weight: 87.3 kg (192 lb 7.4 oz) IBW/kg (Calculated) : 73  Temp (24hrs), Avg:101 F (38.3 C), Min:101 F (38.3 C), Max:101 F (38.3 C)  Recent Labs  Lab 12/27/22 0900  WBC 25.0*  CREATININE 0.78  LATICACIDVEN 1.5    Estimated Creatinine Clearance: 62.1 mL/min (by C-G formula based on SCr of 0.78 mg/dL).    Allergies  Allergen Reactions   Sulfa Antibiotics Rash    Antimicrobials this admission: Zosyn 1/24>> Cefepime 1/24 x 1 Vancomycin 1/24 x 1  Dose adjustments this admission: N/A  Microbiology results: 1/24 BCx: ordered 1/24 Sputum: ordered   Thank you for allowing pharmacy to be a part of this patient's care.  Lorin Picket, PharmD 12/27/2022 11:53 AM

## 2022-12-27 NOTE — ED Provider Notes (Signed)
[facility] Provider Note    Event Date/Time   First MD Initiated Contact with Patient 12/27/22 [phone]     (approximate)   History   Altered Mental Status   HPI  David Atkins is a 89 y.o. male   who reports he has not felt real well today.  He has been struggling with pain over his right chest now for months.  Not quite sure why.  Today he developed some feeling of shortness of breath and fatigue.  EMS reports patient had a mild oxygen requirement of 2 L.  Patient reports being no pain right now except if he takes a deep breath he will have pain around his right rib which has been present for a month or more      Physical Exam   Triage Vital Signs: ED Triage Vitals  Enc Vitals Group     BP 12/27/22 0853 (!) 163/71     Pulse --      Resp 12/27/22 0853 (!) 25     Temp 12/27/22 0853 (!) 101 F (38.3 C)     Temp Source 12/27/22 0853 Rectal     SpO2 12/27/22 0853 95 %     Weight 12/27/22 0856 192 lb 7.4 oz (87.3 kg)     Height 12/27/22 0856 '5\' 10"'$  (1.778 m)     Head Circumference --      Peak Flow --      Pain Score --      Pain Loc --      Pain Edu? --      Excl. in [place]? --     Most recent vital signs: Vitals:   12/27/22 1515 12/27/22 1522  BP: (!) 112/59 (!) 113/58  Pulse: 72 72  Resp: (!) 23 (!) 25  Temp:    SpO2: 100% 100%     General: Awake, no distress.  Feels warm to touch CV:  Good peripheral perfusion.  Normal tones and rateResp:  Normal effort.  However when asked to take a deep breath he splints reporting a pain in his right chest wall.  It has been apparently aggravating him for over a month.  Diminished lung sounds bilaterally.  No obvious wheezing or crackles.  Does not appear acutely dyspneic. Abd:  No distention.  Soft nontender nondistended Other:  Trace lower extremity edema of the feet bilaterally.  No venous cords or congestion   ED Results / Procedures / Treatments   Labs (all labs ordered are listed, but only  abnormal results are displayed) Labs Reviewed  LACTIC ACID, PLASMA - Abnormal; Notable for the following components:      Result Value   Lactic Acid, Venous <0.3 (*)    All other components within normal limits  COMPREHENSIVE METABOLIC PANEL - Abnormal; Notable for the following components:   Potassium 2.9 (*)    Glucose, Bld 124 (*)    Calcium 8.0 (*)    Total Protein 5.9 (*)    Albumin 3.3 (*)    Total Bilirubin 1.3 (*)    All other components within normal limits  CBC WITH DIFFERENTIAL/PLATELET - Abnormal; Notable for the following components:   WBC 25.0 (*)    Neutro Abs 22.3 (*)    Monocytes Absolute 1.3 (*)    Abs Immature Granulocytes 0.50 (*)    All other components within normal limits  BRAIN NATRIURETIC PEPTIDE - Abnormal; Notable for the following components:   B Natriuretic Peptide 723.5 (*)    All  other components within normal limits  RESP PANEL BY RT-PCR (FLU A&B, COVID) ARPGX2  CULTURE, BLOOD (ROUTINE X 2)  CULTURE, BLOOD (ROUTINE X 2)  LACTIC ACID, PLASMA  PROTIME-INR  APTT  MAGNESIUM  PHOSPHORUS  PROCALCITONIN  URINALYSIS, ROUTINE W REFLEX MICROSCOPIC  TYPE AND SCREEN   Labs notably remarkable for a white count of 25,000.  EKG  Interpreted by me at 855 heart rate 80 QRS 90 QTc 420 Normal sinus rhythm slight baseline artifact.  No evidence of frank ischemia or ectopy   RADIOLOGY Chest x-ray interpreted by me as low volume or inspiration, difficult to exclude disease in the base of the lungs bilaterally.  Of note after reviewing this imaging, I have ordered a CT of the chest to further delineate  CT Angio Chest PE W and/or Wo Contrast  Addendum Date: 12/27/2022   ADDENDUM REPORT: 12/27/2022 13:41 ADDENDUM: These results were called by telephone at the time of interpretation on 12/27/2022 at 11:37 a.m. to provider Keyarah Mcroy , who verbally acknowledged these results. Electronically Signed   By: Emmit Alexanders M.D.   On: 12/27/2022 13:41   Result Date:  12/27/2022 CLINICAL DATA:  Pulmonary embolism suspected, high probability. Right-sided chest pain and elevated white blood cell count. EXAM: CT ANGIOGRAPHY CHEST WITH CONTRAST TECHNIQUE: Multidetector CT imaging of the chest was performed using the standard protocol during bolus administration of intravenous contrast. Multiplanar CT image reconstructions and MIPs were obtained to evaluate the vascular anatomy. RADIATION DOSE REDUCTION: This exam was performed according to the departmental dose-optimization program which includes automated exposure control, adjustment of the mA and/or kV according to patient size and/or use of iterative reconstruction technique. CONTRAST:  12m OMNIPAQUE IOHEXOL 350 MG/ML SOLN COMPARISON:  Chest radiograph 12/27/2022. FINDINGS: Cardiovascular: Satisfactory opacification of the pulmonary arteries to the segmental level. No evidence of pulmonary embolism. Postoperative changes of median sternotomy and CABG. Atherosclerotic calcifications of the thoracic aorta and arch vessel origins. Normal heart size. No pericardial effusion. Mediastinum/Nodes: No mediastinal lymphadenopathy. Trachea is patent. Patulous upper thoracic esophagus. Lungs/Pleura: Low lung volumes with bibasilar and dependent atelectasis. Trace right pleural effusion. Upper Abdomen: Distended gallbladder with 2 radiopaque stones in the neck, wall thickening and pericholecystic fluid. Right-greater-than-left perinephric stranding. Atherosclerotic calcifications of the abdominal aorta and its branches. Musculoskeletal: No acute rib fracture. Unchanged moderate compression deformity of the T12 vertebral body. Diffusely demineralized appearance of the bones. Review of the MIP images confirms the above findings. IMPRESSION: 1. No evidence of pulmonary embolism. 2. Distended gallbladder with 2 radiopaque stones in the neck, wall thickening and pericholecystic fluid. Findings are concerning for acute cholecystitis. Consider right  upper quadrant ultrasound for further evaluation. 3. Low lung volumes with bibasilar and dependent atelectasis. Trace right pleural effusion. 4. Aortic Atherosclerosis (ICD10-I70.0). Electronically Signed: By: WEmmit AlexandersM.D. On: 12/27/2022 11:34   [place] LIMITED RUQ (LIVER/GB)  Result Date: 12/27/2022 CLINICAL DATA:  Right-sided abdominal pain since yesterday EXAM: ULTRASOUND ABDOMEN LIMITED RIGHT UPPER QUADRANT COMPARISON:  Ultrasound 11/17/2022.  CT 11/08/2022 and older FINDINGS: Gallbladder: Gallbladder is dilated with significant wall thickening measuring up to 8 mm. There also appears to be some wall edema. Gallstones are seen. Common bile duct: Diameter: 5 mm Liver: No focal lesion identified. Within normal limits in parenchymal echogenicity. Portal vein is patent on color Doppler imaging with normal direction of blood flow towards the liver. Other: Portions of the liver obscured by overlapping bowel gas and soft tissue. IMPRESSION: Dilated gallbladder with wall thickening and edema.  Gallstones. Findings are worrisome for developing acute cholecystitis. Please correlate with symptoms. If needed for confirmation a HIDA scan can be performed as clinically directed. No ductal dilatation. Electronically Signed   By: Jill Side M.D.   On: 12/27/2022 12:49   DG Chest Port 1 View  Result Date: 12/27/2022 CLINICAL DATA:  Sepsis, lethargy EXAM: PORTABLE CHEST 1 VIEW COMPARISON:  12/15/2018 FINDINGS: Cardiomegaly. Prior sternotomy. Aortic atherosclerosis. Very low lung volumes with bibasilar opacities. Possible trace right pleural effusion. No pneumothorax. IMPRESSION: Very low lung volumes with bibasilar opacities, atelectasis versus pneumonia. Possible trace right pleural effusion. Electronically Signed   By: Davina Poke D.O.   On: 12/27/2022 09:25     PROCEDURES:  Critical Care performed: No  Procedures   MEDICATIONS ORDERED IN ED: Medications  lactated ringers infusion (  Intravenous New Bag/Given 12/27/22 1142)  albuterol (PROVENTIL) (2.5 MG/3ML) 0.083% nebulizer solution 2.5 mg (has no administration in time range)  dextromethorphan-guaiFENesin (MUCINEX DM) 30-600 MG per 12 hr tablet 1 tablet (has no administration in time range)  potassium chloride SA (KLOR-CON M) CR tablet 40 mEq (40 mEq Oral Given 12/27/22 1212)  ondansetron (ZOFRAN) injection 4 mg (has no administration in time range)  hydrALAZINE (APRESOLINE) injection 5 mg (has no administration in time range)  acetaminophen (TYLENOL) tablet 650 mg (has no administration in time range)  aspirin EC tablet 81 mg (81 mg Oral Given 12/27/22 1207)  atorvastatin (LIPITOR) tablet 20 mg (20 mg Oral Given 12/27/22 1205)  metoprolol tartrate (LOPRESSOR) tablet 12.5 mg (12.5 mg Oral Given 12/27/22 1206)  nitroGLYCERIN (NITROSTAT) SL tablet 0.4 mg (has no administration in time range)  pantoprazole (PROTONIX) EC tablet 40 mg (40 mg Oral Given 12/27/22 1206)  simethicone (MYLICON) chewable tablet 80 mg (has no administration in time range)  finasteride (PROSCAR) tablet 5 mg (5 mg Oral Given 12/27/22 1317)  oxybutynin (DITROPAN) tablet 5 mg (5 mg Oral Given 12/27/22 1316)  tamsulosin (FLOMAX) capsule 0.4 mg (has no administration in time range)  cholecalciferol (VITAMIN D3) 25 MCG (1000 UNIT) tablet 1,000 Units (1,000 Units Oral Given 12/27/22 1207)  rOPINIRole (REQUIP) tablet 0.25 mg (has no administration in time range)  oxyCODONE-acetaminophen (PERCOCET/ROXICET) 5-325 MG per tablet 1 tablet (has no administration in time range)  morphine (PF) 2 MG/ML injection 1 mg (has no administration in time range)  piperacillin-tazobactam (ZOSYN) IVPB 3.375 g (has no administration in time range)  lidocaine (XYLOCAINE) 1 % (with pres) injection (has no administration in time range)  0.9 %  sodium chloride infusion ( Intravenous New Bag/Given 12/27/22 1449)  fentaNYL (SUBLIMAZE) 100 MCG/2ML injection (has no administration in time  range)  midazolam (VERSED) 2 MG/2ML injection (has no administration in time range)  midazolam (VERSED) injection (1 mg Intravenous Given 12/27/22 1519)  fentaNYL (SUBLIMAZE) injection (50 mcg Intravenous Given 12/27/22 1520)  acetaminophen (TYLENOL) tablet 1,000 mg (1,000 mg Oral Given 12/27/22 0936)  ceFEPIme (MAXIPIME) 2 g in sodium chloride 0.9 % 100 mL IVPB (0 g Intravenous Stopped 12/27/22 1213)  iohexol (OMNIPAQUE) 350 MG/ML injection 75 mL (75 mLs Intravenous Contrast Given 12/27/22 1101)  vancomycin (VANCOREADY) IVPB 1750 mg/350 mL (0 mg Intravenous Stopped 12/27/22 1311)  potassium chloride 10 mEq in 100 mL IVPB (10 mEq Intravenous New Bag/Given 12/27/22 1311)     IMPRESSION / MDM / ASSESSMENT AND PLAN / ED COURSE  I reviewed the triage vital signs and the nursing notes.  Differential diagnosis includes, but is not limited to, possible infection he is febrile here, pneumonia, exclude other causes of right-sided chest pain including empyema, PE etc.  No acute cardiac symptoms.  Denies chest pain except when he takes a deep breath over his right chest wall.  No associated abdominal pain or symptoms.  No rashes.  Examined skin and he does have mild edema in his feet bilaterally.  Denies any abdominal symptoms.  Mild oxygen requirement with EMS.  Symptoms seem most concerning for acute pulmonary infection.  After receiving viral studies back which are negative I am concerned the patient has potential pneumonia empyema or other acute chest process.  I have ordered CT to further evaluate, and broad-spectrum antibiotic coverage.  White count notably elevated concerning for sepsis.  Code sepsis initiated.  He does not meet requirements for large volume resuscitation at this time without severe lactic acidosis or hypotension  Patient's presentation is most consistent with acute complicated illness / injury requiring diagnostic workup.   The patient is on the cardiac  monitor to evaluate for evidence of arrhythmia and/or significant heart rate changes.  Consulted with hospitalist, Dr. Blaine Hamper will be admitting the patient.  In light of CT imaging concerning for cholecystitis also have pending consult which has been discussed with Dr. Dahlia Byes of general surgery.       FINAL CLINICAL IMPRESSION(S) / ED DIAGNOSES   Final diagnoses:  Acute cholecystitis  Sepsis, due to unspecified organism, unspecified whether acute organ dysfunction present [facility])     Rx / DC Orders   ED Discharge Orders     None        Note:  This document was prepared using Dragon voice recognition software and may include unintentional dictation errors.   Delman Kitten, MD 12/27/22 1525

## 2022-12-27 NOTE — ED Notes (Signed)
Patient denies pain and is resting comfortably.  

## 2022-12-27 NOTE — Procedures (Signed)
Pre procedural Dx: Acute cholecysitis Post procedural Dx: Same  Technically successful [place] and Fluoro guided placement of a 10 Fr drainage catheter placement into the gallbladder lumen. Chole tube connected to gravity bag.  EBL: Trace Complications: None immediate  Ronny Bacon, MD Pager #: [phone]

## 2022-12-28 ENCOUNTER — Encounter: Payer: Self-pay | Admitting: Internal Medicine

## 2022-12-28 DIAGNOSIS — K810 Acute cholecystitis: Secondary | ICD-10-CM

## 2022-12-28 LAB — BASIC METABOLIC PANEL
Anion gap: 7 (ref 5–15)
BUN: 19 mg/dL (ref 8–23)
CO2: 23 mmol/L (ref 22–32)
Calcium: 7.6 mg/dL — ABNORMAL LOW (ref 8.9–10.3)
Chloride: 107 mmol/L (ref 98–111)
Creatinine, Ser: 0.89 mg/dL (ref 0.61–1.24)
GFR, Estimated: >60 mL/min (ref >60)
Glucose, Bld: 107 mg/dL — ABNORMAL HIGH (ref 70–99)
Potassium: 3.6 mmol/L (ref 3.5–5.1)
Sodium: 137 mmol/L (ref 135–145)

## 2022-12-28 LAB — CBC
HCT: 38.2 % — ABNORMAL LOW (ref 39.0–52.0)
Hemoglobin: 12.8 g/dL — ABNORMAL LOW (ref 13.0–17.0)
MCH: 32.4 pg (ref 26.0–34.0)
MCHC: 33.5 g/dL (ref 30.0–36.0)
MCV: 96.7 fL (ref 80.0–100.0)
Platelets: 168 10*3/uL (ref 150–400)
RBC: 3.95 MIL/uL — ABNORMAL LOW (ref 4.22–5.81)
RDW: 13.1 % (ref 11.5–15.5)
WBC: 18.5 10*3/uL — ABNORMAL HIGH (ref 4.0–10.5)
nRBC: 0 % (ref 0.0–0.2)

## 2022-12-28 MED ORDER — SODIUM CHLORIDE 0.9% FLUSH: 5.0000 mL | Freq: Three times a day (TID) | INTRAVENOUS | Status: DC

## 2022-12-28 NOTE — Progress Notes (Signed)
Patient has home cpap unit at bedside. Home unit observed to be free of loose or frayed cords, and appears to be in proper working condition. 2L oxygen bled into home unit. Unit plugged into red outlet.

## 2022-12-28 NOTE — ED Notes (Signed)
Secretary notified for patient transport

## 2022-12-28 NOTE — Progress Notes (Signed)
PROGRESS NOTE    David Atkins  HYI:[id number] DOB: 23-May-1929 DOA: 12/27/2022 PCP: [name], MD    Brief Narrative:  This 89 years old male with PMH significant for dCHF, hypertension, hyperlipidemia, COPD, CABG, GERD, BPH, BPPV, Nonepileptic seizure (not taking medications), who presents with fever and right flank pain.  Daughter reports patient had right flank pain for about a month.  Patient had negative outpatient workup including normal LFTs and negative ultrasound.  Describes pain as aching, non radiating associated with nausea.  He was febrile in ED with a temp of 101 associated shallow breathing,  slightly confused.  CTA negative for PE but showed acute cholecystitis.  General surgery consulted recommended cholecystostomy tube.  IR consulted  and Patient underwent successful cholecystostomy tube.  Assessment & Plan:   Active Problems:   Acute cholecystitis   Sepsis (HCC)   Chronic diastolic CHF (congestive heart failure) (HCC)   Hypertension   Hyperlipidemia LDL goal <70   CAD (coronary artery disease)   Hypokalemia   BPH (benign prostatic hyperplasia)   OSA on CPAP  Sepsis secondary to acute cholecystitis: Patient presented with fever 101. 4,  tachycardia, tachypnea, leukocytosis, lactic acid 1.5, Initially suspected pneumonia in the ED and was given vancomycin and cefepime. CTA chest ruled out pulmonary embolism but showed acute cholecystitis. Patient does not have cough or shortness of breath clinically,  does not seem to be pneumonia.   General surgery was consulted recommended IR consult for possible percutaneous cholecystostomy tube. Continue IV antibiotics Zosyn. Follow-up cultures. Continue IV fluid resuscitation. Patient underwent successful percutaneous cholecystostomy tube,  tolerated well. Patient will have cholecystostomy tube for 4 to 6 weeks. Continue adequate pain control with morphine, Percocet as needed. IV Zofran as needed for nausea and  vomiting. Surgery signed off. At discharge can be discharged on Augmentin for 14 days ( total duration). He may never be an optimal candidate for surgery.  Continue cholecystostomy tube.  Chronic diastolic CHF: Does not appear in acute exacerbation.   Last echo showed LVEF 60 to 65%.   Patient has slightly elevated BNP 723 but no leg edema.   Currently remains 96% on room air.   Does not seem to be obvious CHF exacerbation.   Likely secondary to sepsis.  Will not restart diuretics. Monitor volume status closely.  Essential HTN: Continue IV hydralazine as needed.   Hold amlodipine and lisinopril since blood pressure is on the soft side. Hold metoprolol    Hyperlipidemia: Continue Lipitor 20 mg daily.  Coronary artery disease: Stable.  Continue aspirin and Lipitor  Hypokalemia; Replaced and resolved.  BPH: Denies any LUTS. Continue Flomax , oxybutynin and Proscar  OSA: Continue CPAP at night.   DVT prophylaxis: SCDs Code Status: DNR Family Communication: Daughter at bed side, Disposition Plan:    Status is: Inpatient Remains inpatient appropriate because: Admitted for acute cholecystitis underwent successful cholecystostomy tube by IR.   Consultants:  General surgery IR  Procedures: Percutaneous cholecystostomy tube. Antimicrobials:  Anti-infectives (From admission, onward)    Start     Dose/Rate Route Frequency Ordered Stop   12/27/22 1600  piperacillin-tazobactam (ZOSYN) IVPB 3.375 g        3.375 g 12.5 mL/hr over 240 Minutes Intravenous Every 8 hours 12/27/22 1150     12/27/22 1115  vancomycin (VANCOREADY) IVPB 1750 mg/350 mL        1,750 mg 175 mL/hr over 120 Minutes Intravenous  Once 12/27/22 1104 12/27/22 1311   12/27/22 1100  vancomycin (VANCOCIN)  IVPB 1000 mg/200 mL premix  Status:  Discontinued        1,000 mg 200 mL/hr over 60 Minutes Intravenous  Once 12/27/22 1051 12/27/22 1103   12/27/22 1100  ceFEPIme (MAXIPIME) 2 g in sodium chloride 0.9 % 100  mL IVPB        2 g 200 mL/hr over 30 Minutes Intravenous  Once 12/27/22 1051 12/27/22 1213      Subjective: Patient has been examined at bedside.  Overnight events noted.   Patient report doing much better. He still reports having some soreness in the right upper abdomen but denies any nausea or vomiting.  S/p percutaneous cholecystostomy tube.  Objective: Vitals:   12/28/22 0900 12/28/22 0930 12/28/22 1000 12/28/22 1030  BP: (!) 154/92 (!) 142/63 (!) 140/60 (!) 140/76  Pulse: 71 70 65 72  Resp: 20 (!) 24 19 (!) 23  Temp:      TempSrc:      SpO2: 93% 92% 93% 93%  Weight:      Height:        Intake/Output Summary (Last 24 hours) at 12/28/2022 1230 Last data filed at 12/28/2022 1100 Gross per 24 hour  Intake 96.5 ml  Output 1035 ml  Net -938.5 ml   Filed Weights   12/27/22 0856  Weight: 87.3 kg    Examination:  General exam: Appears comfortable, not in any acute distress.  Deconditioned. Respiratory system: CTA bilaterally, respiratory effort normal, RR 15 Cardiovascular system: S1 & S2 heard, regular rate and rhythm, no murmur. Gastrointestinal system: Abdomen is soft, mildly tender in the RUQ, [place] tube noted. Central nervous system: Alert and oriented x 2. No focal neurological deficits. Extremities: No edema, no cyanosis, no clubbing. Skin: No rashes, lesions or ulcers Psychiatry: Judgement and insight appear normal. Mood & affect appropriate.     Data Reviewed: I have personally reviewed following labs and imaging studies  CBC: Recent Labs  Lab 12/27/22 0900 12/28/22 0610  WBC 25.0* 18.5*  NEUTROABS 22.3*  --   HGB 13.8 12.8*  HCT 41.0 38.2*  MCV 96.0 96.7  PLT 192 505   Basic Metabolic Panel: Recent Labs  Lab 12/27/22 0900 12/28/22 0610  NA 136 137  K 2.9* 3.6  CL 105 107  CO2 23 23  GLUCOSE 124* 107*  BUN 16 19  CREATININE 0.78 0.89  CALCIUM 8.0* 7.6*  MG 1.9  --   PHOS 3.2  --    GFR: Estimated Creatinine Clearance: 55.8 mL/min (by C-G  formula based on SCr of 0.89 mg/dL). Liver Function Tests: Recent Labs  Lab 12/27/22 0900  AST 26  ALT 20  ALKPHOS 46  BILITOT 1.3*  PROT 5.9*  ALBUMIN 3.3*   No results for input(s): "LIPASE", "AMYLASE" in the last 168 hours. No results for input(s): "AMMONIA" in the last 168 hours. Coagulation Profile: Recent Labs  Lab 12/27/22 0900  INR 1.2   Cardiac Enzymes: No results for input(s): "CKTOTAL", "CKMB", "CKMBINDEX", "TROPONINI" in the last 168 hours. BNP (last 3 results) No results for input(s): "PROBNP" in the last 8760 hours. HbA1C: No results for input(s): "HGBA1C" in the last 72 hours. CBG: No results for input(s): "GLUCAP" in the last 168 hours. Lipid Profile: No results for input(s): "CHOL", "HDL", "LDLCALC", "TRIG", "CHOLHDL", "LDLDIRECT" in the last 72 hours. Thyroid Function Tests: No results for input(s): "TSH", "T4TOTAL", "FREET4", "T3FREE", "THYROIDAB" in the last 72 hours. Anemia Panel: No results for input(s): "VITAMINB12", "FOLATE", "FERRITIN", "TIBC", "IRON", "RETICCTPCT" in the last  72 hours. Sepsis Labs: Recent Labs  Lab 12/27/22 0900 12/27/22 1141  PROCALCITON 2.16  --   LATICACIDVEN 1.5 <0.3*    Recent Results (from the past 240 hour(s))  Blood Culture (routine x 2)     Status: None (Preliminary result)   Collection Time: 12/27/22  9:00 AM   Specimen: BLOOD  Result Value Ref Range Status   Specimen Description BLOOD BLOOD RIGHT ARM  Final   Special Requests   Final    BOTTLES DRAWN AEROBIC AND ANAEROBIC Blood Culture results may not be optimal due to an excessive volume of blood received in culture bottles   Culture   Final    NO GROWTH < 24 HOURS Performed at [facility], [address] [place], [place] [address]    Report Status PENDING  Incomplete  Blood Culture (routine x 2)     Status: None (Preliminary result)   Collection Time: 12/27/22  9:00 AM   Specimen: BLOOD  Result Value Ref Range Status   Specimen Description  BLOOD BLOOD LEFT ARM  Final   Special Requests   Final    BOTTLES DRAWN AEROBIC AND ANAEROBIC Blood Culture adequate volume   Culture   Final    NO GROWTH < 24 HOURS Performed at [facility], [address] [place], [place] [address]    Report Status PENDING  Incomplete  Resp Panel by RT-PCR (Flu A&B, Covid) Anterior Nasal Swab     Status: None   Collection Time: 12/27/22  9:01 AM   Specimen: Anterior Nasal Swab  Result Value Ref Range Status   SARS Coronavirus 2 by RT PCR NEGATIVE NEGATIVE Final    Comment: (NOTE) SARS-CoV-2 target nucleic acids are NOT DETECTED.  The SARS-CoV-2 RNA is generally detectable in upper respiratory specimens during the acute phase of infection. The lowest concentration of SARS-CoV-2 viral copies this assay can detect is 138 copies/mL. A negative result does not preclude SARS-Cov-2 infection and should not be used as the sole basis for treatment or other patient management decisions. A negative result may occur with  improper specimen collection/handling, submission of specimen other than nasopharyngeal swab, presence of viral mutation(s) within the areas targeted by this assay, and inadequate number of viral copies(<138 copies/mL). A negative result must be combined with clinical observations, patient history, and epidemiological information. The expected result is Negative.  Fact Sheet for Patients:  EntrepreneurPulse.com.au  Fact Sheet for Healthcare Providers:  IncredibleEmployment.be  This test is no t yet approved or cleared by the [place] FDA and  has been authorized for detection and/or diagnosis of SARS-CoV-2 by FDA under an Emergency Use Authorization (EUA). This EUA will remain  in effect (meaning this test can be used) for the duration of the COVID-19 declaration under Section 564(b)(1) of the Act, 21 U.S.C.section 360bbb-3(b)(1), unless the authorization is terminated  or revoked  sooner.       Influenza A by PCR NEGATIVE NEGATIVE Final   Influenza B by PCR NEGATIVE NEGATIVE Final    Comment: (NOTE) The Xpert Xpress SARS-CoV-2/FLU/RSV plus assay is intended as an aid in the diagnosis of influenza from Nasopharyngeal swab specimens and should not be used as a sole basis for treatment. Nasal washings and aspirates are unacceptable for Xpert Xpress SARS-CoV-2/FLU/RSV testing.  Fact Sheet for Patients: EntrepreneurPulse.com.au  Fact Sheet for Healthcare Providers: IncredibleEmployment.be  This test is not yet approved or cleared by the [place] FDA and has been authorized for detection and/or diagnosis of SARS-CoV-2  by FDA under an Emergency Use Authorization (EUA). This EUA will remain in effect (meaning this test can be used) for the duration of the COVID-19 declaration under Section 564(b)(1) of the Act, 21 U.S.C. section 360bbb-3(b)(1), unless the authorization is terminated or revoked.  Performed at [facility], [address] [place], [place] [address]      Radiology Studies: IR Perc Cholecystostomy  Result Date: 12/27/2022 INDICATION: Acute cholecystitis.  Poor operative candidate. EXAM: ULTRASOUND AND FLUOROSCOPIC-GUIDED CHOLECYSTOSTOMY TUBE PLACEMENT COMPARISON:  Chest CT-12/27/2022; right upper quadrant abdominal ultrasound-12/27/2022 MEDICATIONS: The patient is currently admitted to the hospital and on intravenous antibiotics. Antibiotics were administered within an appropriate time frame prior to skin puncture. ANESTHESIA/SEDATION: Moderate (conscious) sedation was employed during this procedure. A total of Versed 1 mg and Fentanyl 50 mcg was administered intravenously. Moderate Sedation Time: 14 minutes. The patient's level of consciousness and vital signs were monitored continuously by radiology nursing throughout the procedure under my direct supervision. CONTRAST:  58m OMNIPAQUE IOHEXOL 300  MG/ML SOLN - administered into the gallbladder lumen. FLUOROSCOPY TIME:  36 seconds (8.5 mGy) COMPLICATIONS: None immediate. PROCEDURE: Informed written consent was obtained from the patient after a discussion of the risks, benefits and alternatives to treatment. Questions regarding the procedure were encouraged and answered. A timeout was performed prior to the initiation of the procedure. The right upper abdominal quadrant was prepped and draped in the usual sterile fashion, and a sterile drape was applied covering the operative field. Maximum barrier sterile technique with sterile gowns and gloves were used for the procedure. A timeout was performed prior to the initiation of the procedure. Local anesthesia was provided with 1% lidocaine with epinephrine. Ultrasound scanning of the right upper quadrant demonstrates a markedly dilated gallbladder. Utilizing a transhepatic approach, a 22 gauge needle was advanced into the gallbladder under direct ultrasound guidance. An ultrasound image was saved for documentation purposes. Appropriate intraluminal puncture was confirmed with the efflux of bile and advancement of an 0.018 wire into the gallbladder lumen. The needle was exchanged for an ABudset. A small amount of contrast was injected to confirm appropriate intraluminal positioning. Over a Benson wire, a 1532-French cholecystomy tube was advanced into the gallbladder fossa, coiled and locked. Bile was aspirated and a small amount of contrast was injected as several post procedural spot radiographic images were obtained in various obliquities. The catheter was secured to the skin with suture, connected to a drainage bag and a dressing was placed. The patient tolerated the procedure well without immediate post procedural complication. IMPRESSION: Successful ultrasound and fluoroscopic guided placement of a 10.2 French cholecystostomy tube. Electronically Signed   By: JSandi MariscalM.D.   On: 12/27/2022 15:46    CT Angio Chest PE W and/or Wo Contrast  Addendum Date: 12/27/2022   ADDENDUM REPORT: 12/27/2022 13:41 ADDENDUM: These results were called by telephone at the time of interpretation on 12/27/2022 at 11:37 a.m. to provider MARK QUALE , who verbally acknowledged these results. Electronically Signed   By: WEmmit AlexandersM.D.   On: 12/27/2022 13:41   Result Date: 12/27/2022 CLINICAL DATA:  Pulmonary embolism suspected, high probability. Right-sided chest pain and elevated white blood cell count. EXAM: CT ANGIOGRAPHY CHEST WITH CONTRAST TECHNIQUE: Multidetector CT imaging of the chest was performed using the standard protocol during bolus administration of intravenous contrast. Multiplanar CT image reconstructions and MIPs were obtained to evaluate the vascular anatomy. RADIATION DOSE REDUCTION: This exam was performed according to the departmental dose-optimization program which includes  automated exposure control, adjustment of the mA and/or kV according to patient size and/or use of iterative reconstruction technique. CONTRAST:  26m OMNIPAQUE IOHEXOL 350 MG/ML SOLN COMPARISON:  Chest radiograph 12/27/2022. FINDINGS: Cardiovascular: Satisfactory opacification of the pulmonary arteries to the segmental level. No evidence of pulmonary embolism. Postoperative changes of median sternotomy and CABG. Atherosclerotic calcifications of the thoracic aorta and arch vessel origins. Normal heart size. No pericardial effusion. Mediastinum/Nodes: No mediastinal lymphadenopathy. Trachea is patent. Patulous upper thoracic esophagus. Lungs/Pleura: Low lung volumes with bibasilar and dependent atelectasis. Trace right pleural effusion. Upper Abdomen: Distended gallbladder with 2 radiopaque stones in the neck, wall thickening and pericholecystic fluid. Right-greater-than-left perinephric stranding. Atherosclerotic calcifications of the abdominal aorta and its branches. Musculoskeletal: No acute rib fracture. Unchanged moderate  compression deformity of the T12 vertebral body. Diffusely demineralized appearance of the bones. Review of the MIP images confirms the above findings. IMPRESSION: 1. No evidence of pulmonary embolism. 2. Distended gallbladder with 2 radiopaque stones in the neck, wall thickening and pericholecystic fluid. Findings are concerning for acute cholecystitis. Consider right upper quadrant ultrasound for further evaluation. 3. Low lung volumes with bibasilar and dependent atelectasis. Trace right pleural effusion. 4. Aortic Atherosclerosis (ICD10-I70.0). Electronically Signed: By: WEmmit AlexandersM.D. On: 12/27/2022 11:34   [place]DOM[place] LIMITED RUQ (LIVER/GB)  Result Date: 12/27/2022 CLINICAL DATA:  Right-sided abdominal pain since yesterday EXAM: ULTRASOUND ABDOMEN LIMITED RIGHT UPPER QUADRANT COMPARISON:  Ultrasound 11/17/2022.  CT 11/08/2022 and older FINDINGS: Gallbladder: Gallbladder is dilated with significant wall thickening measuring up to 8 mm. There also appears to be some wall edema. Gallstones are seen. Common bile duct: Diameter: 5 mm Liver: No focal lesion identified. Within normal limits in parenchymal echogenicity. Portal vein is patent on color Doppler imaging with normal direction of blood flow towards the liver. Other: Portions of the liver obscured by overlapping bowel gas and soft tissue. IMPRESSION: Dilated gallbladder with wall thickening and edema. Gallstones. Findings are worrisome for developing acute cholecystitis. Please correlate with symptoms. If needed for confirmation a HIDA scan can be performed as clinically directed. No ductal dilatation. Electronically Signed   By: AJill SideM.D.   On: 12/27/2022 12:49   DG Chest Port 1 View  Result Date: 12/27/2022 CLINICAL DATA:  Sepsis, lethargy EXAM: PORTABLE CHEST 1 VIEW COMPARISON:  12/15/2018 FINDINGS: Cardiomegaly. Prior sternotomy. Aortic atherosclerosis. Very low lung volumes with bibasilar opacities. Possible trace right pleural  effusion. No pneumothorax. IMPRESSION: Very low lung volumes with bibasilar opacities, atelectasis versus pneumonia. Possible trace right pleural effusion. Electronically Signed   By: NDavina PokeD.O.   On: 12/27/2022 09:25    Scheduled Meds:  aspirin EC  81 mg Oral Daily   atorvastatin  20 mg Oral Daily   cholecalciferol  1,000 Units Oral Daily   enoxaparin (LOVENOX) injection  40 mg Subcutaneous Q24H   finasteride  5 mg Oral Daily   metoprolol tartrate  12.5 mg Oral BID   oxybutynin  5 mg Oral BID   pantoprazole  40 mg Oral Daily   rOPINIRole  0.25 mg Oral QHS   tamsulosin  0.4 mg Oral QPC breakfast   Continuous Infusions:  sodium chloride Stopped (12/27/22 1600)   lactated ringers 75 mL/hr at 12/27/22 1142   piperacillin-tazobactam (ZOSYN)  IV 3.375 g (12/28/22 1042)     LOS: 1 day    Time spent: 50 mins  Kamaile Zachow, MD [facility]   If 7PM-7AM, please contact night-coverage

## 2022-12-28 NOTE — Evaluation (Signed)
Physical Therapy Evaluation Patient Details Name: David Atkins MRN: [id number] DOB: 1929-04-28 Today's Date: 12/28/2022  History of Present Illness  Agron Swiney is a 89 y.o. male with medical history significant of dCHF, hypertension, hyperlipidemia, COPD, CABG, GERD, BPH, BPPV, nonepileptic seizure (not taking medications), who presents with fever and right flank pain. S/P percutaneous cholecystostomy tube placement on 01/24   Clinical Impression  Patient received in bed, daughter at bedside. Patient reports he is feeling okay, little pain. He is agreeable to PT session. Patient required min A for bed mobility and transfers from slightly elevated bed. He is able to take a few steps from bed to recliner with RW and min guard. Patient will continue to benefit from skilled PT to improve functional independence, strength and safety with mobility.        Recommendations for follow up therapy are one component of a multi-disciplinary discharge planning process, led by the attending physician.  Recommendations may be updated based on patient status, additional functional criteria and insurance authorization.  Follow Up Recommendations Home health PT      Assistance Recommended at Discharge Frequent or constant Supervision/Assistance  Patient can return home with the following  A little help with walking and/or transfers;A little help with bathing/dressing/bathroom;Assist for transportation;Help with stairs or ramp for entrance;Assistance with cooking/housework    Equipment Recommendations None recommended by PT  Recommendations for Other Services       Functional Status Assessment Patient has had a recent decline in their functional status and demonstrates the ability to make significant improvements in function in a reasonable and predictable amount of time.     Precautions / Restrictions Precautions Precautions: Fall Restrictions Weight Bearing Restrictions: No      Mobility  Bed  Mobility Overal bed mobility: Needs Assistance Bed Mobility: Supine to Sit     Supine to sit: Min assist     General bed mobility comments: good effort, required min A to get fully upright    Transfers Overall transfer level: Needs assistance Equipment used: Rolling walker (2 wheels) Transfers: Sit to/from Stand Sit to Stand: From elevated surface, Min assist                Ambulation/Gait Ambulation/Gait assistance: Min guard Gait Distance (Feet): 3 Feet Assistive device: Rolling walker (2 wheels) Gait Pattern/deviations: Step-to pattern, Decreased step length - left, Decreased step length - right, Decreased stride length Gait velocity: decr     General Gait Details: patient ambulated from bed to recliner ~4 feet with RW. Min guard.  Stairs            Wheelchair Mobility    Modified Rankin (Stroke Patients Only)       Balance Overall balance assessment: Needs assistance Sitting-balance support: Feet supported Sitting balance-Leahy Scale: Fair     Standing balance support: Bilateral upper extremity supported, During functional activity, Reliant on assistive device for balance Standing balance-Leahy Scale: Fair                               Pertinent Vitals/Pain Pain Assessment Pain Assessment: Faces Faces Pain Scale: Hurts a little bit Pain Location: R flank Pain Descriptors / Indicators: Discomfort    Home Living Family/patient expects to be discharged to:: Private residence Living Arrangements: Spouse/significant other;Children Available Help at Discharge: Family;Available 24 hours/day Type of Home: House Home Access: Level entry       Home Layout: One level Home Equipment: Rollator (4  wheels)      Prior Function Prior Level of Function : Independent/Modified Independent             Mobility Comments: patient uses rollator at baseline ADLs Comments: has wife and daughter at home to assist     Hand Dominance         Extremity/Trunk Assessment   Upper Extremity Assessment Upper Extremity Assessment: Generalized weakness    Lower Extremity Assessment Lower Extremity Assessment: Generalized weakness    Cervical / Trunk Assessment Cervical / Trunk Assessment: Normal  Communication   Communication: HOH  Cognition Arousal/Alertness: Awake/alert Behavior During Therapy: WFL for tasks assessed/performed Overall Cognitive Status: Within Functional Limits for tasks assessed                                          General Comments      Exercises     Assessment/Plan    PT Assessment Patient needs continued PT services  PT Problem List Decreased strength;Decreased activity tolerance;Decreased balance;Decreased mobility       PT Treatment Interventions DME instruction;Gait training;Functional mobility training;Therapeutic activities;Patient/family education;Balance training;Therapeutic exercise    PT Goals (Current goals can be found in the Care Plan section)  Acute Rehab PT Goals Patient Stated Goal: to return home with assist PT Goal Formulation: With patient/family Time For Goal Achievement: 01/03/23 Potential to Achieve Goals: Good    Frequency Min 2X/week     Co-evaluation               AM-PAC PT "6 Clicks" Mobility  Outcome Measure Help needed turning from your back to your side while in a flat bed without using bedrails?: A Lot Help needed moving from lying on your back to sitting on the side of a flat bed without using bedrails?: A Lot Help needed moving to and from a bed to a chair (including a wheelchair)?: A Little Help needed standing up from a chair using your arms (e.g., wheelchair or bedside chair)?: A Lot Help needed to walk in hospital room?: A Little Help needed climbing 3-5 steps with a railing? : A Lot 6 Click Score: 14    End of Session Equipment Utilized During Treatment: Oxygen Activity Tolerance: Patient tolerated treatment  well Patient left: in chair;with family/visitor present Nurse Communication: Mobility status PT Visit Diagnosis: Unsteadiness on feet (R26.81);Other abnormalities of gait and mobility (R26.89);Muscle weakness (generalized) (M62.81);Difficulty in walking, not elsewhere classified (R26.2)    Time: 5956-3875 PT Time Calculation (min) (ACUTE ONLY): 28 min   Charges:   PT Evaluation $PT Eval Moderate Complexity: 1 Mod PT Treatments $Gait Training: 8-22 mins        Asiah Befort, PT, GCS 12/28/22,4:52 PM

## 2022-12-28 NOTE — ED Notes (Signed)
Gallbladder drain noted to right upper abdominal quadrant to gravity drain via bag. No redness swelling noted to site. Dx is clean, dry

## 2022-12-28 NOTE — ED Notes (Signed)
Dark red Bloody drainage noted in collection bag

## 2022-12-28 NOTE — Discharge Instructions (Signed)
In addition to included general post-operative instructions,  Diet: Resume home diet.   Wound care / Drain: Okay to shower if you can keep the drain site covered and water proof, you may shower. No baths, do not submerge under water. Monitor and record output; hand out given.   Medications: Resume all home medications. For mild to moderate pain: acetaminophen (Tylenol) or ibuprofen/naproxen (if no kidney disease). Combining Tylenol with alcohol can substantially increase your risk of causing liver disease. Narcotic pain medications, if prescribed, can be used for severe pain, though may cause nausea, constipation, and drowsiness. Do not combine Tylenol and Percocet (or similar) within a 6 hour period as Percocet (and similar) contain(s) Tylenol. If you do not need the narcotic pain medication, you do not need to fill the prescription.  Call office [phone] / [phone]) at any time if any questions, worsening pain, fevers/chills, bleeding, drainage from incision site, or other concerns.

## 2022-12-28 NOTE — Progress Notes (Signed)
[facility] SURGICAL PROGRESS NOTE (cpt [phone])  Hospital Day(s): 1.   Interval History: Patient seen and examined, no acute events or new complaints overnight. Patient reports he is doing much better. He looks markedly improved and in brighter spirits compared to yesterday. Daughter at bedside. No fever, chills, nausea, emesis. He denied any abdominal pain. Leukocytosis is improved; now 18.5K. Renal function remains normal; sCr - 0.89; UO - 50 ccs + unmeasured. Previous hypokalemia now resolved; 3.6 this AM. Underwent percutaneous cholecystostomy tube placement yesterday (01/24) with IR; output 300 ccs and bilious with slight blood tinge. No Cx taken. Continues on Zosyn. His diet was advanced to heart healthy.   Review of Systems:  Constitutional: denies fever, chills  HEENT: denies cough or congestion  Respiratory: denies any shortness of breath  Cardiovascular: denies chest pain or palpitations  Gastrointestinal: denies abdominal pain, N/V Genitourinary: denies burning with urination or urinary frequency Musculoskeletal: denies pain, decreased motor or sensation  Vital signs in last 24 hours: [min-max] current  Temp:  [97.9 F (36.6 C)-101 F (38.3 C)] 98 F (36.7 C) (01/25 0330) Pulse Rate:  [61-89] 69 (01/25 0700) Resp:  [17-26] 25 (01/25 0700) BP: (95-163)/(46-87) 138/68 (01/25 0700) SpO2:  [93 %-100 %] 95 % (01/25 0630) Weight:  [87.3 kg] 87.3 kg (01/24 0856)     Height: '5\' 10"'$  (177.8 cm) Weight: 87.3 kg BMI (Calculated): 27.62   Intake/Output last 2 shifts:  01/24 0701 - 01/25 0700 In: 96.5 [IV Piggyback:96.5] Out: 860 [Urine:50; Drains:300]   Physical Exam:  Constitutional: alert, cooperative and no distress  HENT: normocephalic without obvious abnormality  Eyes: PERRL, EOM's grossly intact and symmetric  Respiratory: breathing non-labored at rest  Cardiovascular: regular rate and sinus rhythm  Gastrointestinal: soft, non-tender, and non-distended, no  rebound/guarding. Cholecystostomy tube in RUQ; output bilious with slight blood tinge Musculoskeletal: no edema or wounds, motor and sensation grossly intact, NT    Labs:     Latest Ref Rng & Units 12/28/2022    6:10 AM 12/27/2022    9:00 AM 08/22/2022   10:09 AM  CBC  WBC 4.0 - 10.5 K/uL 18.5  25.0  7.7   Hemoglobin 13.0 - 17.0 g/dL 12.8  13.8  13.7   Hematocrit 39.0 - 52.0 % 38.2  41.0  42.1   Platelets 150 - 400 K/uL 168  192  203       Latest Ref Rng & Units 12/28/2022    6:10 AM 12/27/2022    9:00 AM 12/07/2022    1:03 PM  CMP  Glucose 70 - 99 mg/dL 107  124  80   BUN 8 - 23 mg/dL '19  16  13   '$ Creatinine 0.61 - 1.24 mg/dL 0.89  0.78  0.78   Sodium 135 - 145 mmol/L 137  136  142   Potassium 3.5 - 5.1 mmol/L 3.6  2.9  3.8   Chloride 98 - 111 mmol/L 107  105  105   CO2 22 - 32 mmol/L '23  23  31   '$ Calcium 8.9 - 10.3 mg/dL 7.6  8.0  9.1   Total Protein 6.5 - 8.1 g/dL  5.9  6.0   Total Bilirubin 0.3 - 1.2 mg/dL  1.3  0.5   Alkaline Phos 38 - 126 U/L  46  58   AST 15 - 41 U/L  26  24   ALT 0 - 44 U/L  20  20     Imaging studies: No new pertinent  imaging studies   Assessment/Plan: (ICD-10's: K81.0) 89 y.o. male with acute cholecystitis s/p percutaneous cholecystostomy tube placement on 74/08, complicated by pertinent comorbidities including advanced age.   - Okay to continue diet if tolerating - Continue IV Abx (Zosyn). At discharge, can transition to PO Augmentin to complete 14 days total (IV + PO) - Monitor UO; renal function normal - Continue percutaneous cholecystostomy tube; monitor and record output daily. He will need to keep this for 6-8 weeks minimum before considering surgery, although, given his age and functional status, he may never be an optimal candidate.    - Monitor abdominal examination; on-going bowel function - Pain control prn; antiemetics prn   - Monitor leukocytosis; improving   - Further management per primary service  - Discharge Planning: Nothing  further from surgical perspective. Continue percutaneous cholecystostomy tube as mentioned above, Abx as above. I will arrange follow up in 3-4 weeks in the office and update DC instructions. Please call with questions/concerns.   All of the above findings and recommendations were discussed with the patient, patient's family (daughter at bedside), and the medical team, and all of patient's and family's questions were answered to their expressed satisfaction.  -- Edison Simon, PA-C [facility] 12/28/2022, 7:20 AM M-F: 7am - 4pm

## 2022-12-29 DIAGNOSIS — K810 Acute cholecystitis: Secondary | ICD-10-CM | POA: Diagnosis not present

## 2022-12-29 LAB — CBC
HCT: 40.5 % (ref 39.0–52.0)
Hemoglobin: 13.6 g/dL (ref 13.0–17.0)
MCH: 32.2 pg (ref 26.0–34.0)
MCHC: 33.6 g/dL (ref 30.0–36.0)
MCV: 96 fL (ref 80.0–100.0)
Platelets: 155 10*3/uL (ref 150–400)
RBC: 4.22 MIL/uL (ref 4.22–5.81)
RDW: 13 % (ref 11.5–15.5)
WBC: 13.7 10*3/uL — ABNORMAL HIGH (ref 4.0–10.5)
nRBC: 0 % (ref 0.0–0.2)

## 2022-12-29 LAB — MAGNESIUM: Magnesium: 1.9 mg/dL (ref 1.7–2.4)

## 2022-12-29 LAB — PHOSPHORUS: Phosphorus: 2.6 mg/dL (ref 2.5–4.6)

## 2022-12-29 MED ORDER — AMOXICILLIN-POT CLAVULANATE 875-125 MG PO TABS: 1.0000 | ORAL_TABLET | Freq: Two times a day (BID) | ORAL | 0 refills | Status: AC

## 2022-12-29 MED ORDER — FLEET ENEMA 7-19 GM/118ML RE ENEM: 1.0000 | ENEMA | Freq: Once | RECTAL | Status: DC

## 2022-12-29 MED ORDER — POLYETHYLENE GLYCOL 3350 17 G PO PACK
17.0000 g | PACK | Freq: Every day | ORAL | Status: DC
  Administered 2022-12-29 – 2022-12-30 (×2): 17 g via ORAL
  Filled 2022-12-29 (×2): qty 1

## 2022-12-29 NOTE — TOC Progression Note (Signed)
Transition of Care [facility]) - Progression Note    Patient Details  Name: David Atkins MRN: [id number] Date of Birth: May 02, 1929  Transition of Care [facility]) CM/SW [place] Phone Number: 12/29/2022, 1:29 PM   Judithann [facility] Appeal Detailed Notice of Discharge letter created and saved: Yes (Completed by Kelby Fam, LCSW) Detailed Notice of Discharge Document Given to [facility]: Yes (Completed by Kelby Fam, LCSW) [employer] ROI Document Created: Yes [employer] appeal documents uploaded to [employer] stite: Yes (Upload Confirmation: [id number])   [employer] Case ID: [id number]

## 2022-12-29 NOTE — Care Management Important Message (Signed)
Important Message  Patient Details  Name: David Atkins MRN: [id number] Date of Birth: [birth year]   Medicare Important Message Given:  Yes     Dannette Barbara 12/29/2022, 12:12 PM

## 2022-12-29 NOTE — TOC Initial Note (Addendum)
Transition of Care [facility]) - Initial/Assessment Note    Patient Details  Name: David Atkins MRN: [id number] Date of Birth: October 18, 1929  Transition of Care [facility]) CM/SW Contact:    Tiburcio Bash, LCSW Phone Number: 12/29/2022, 11:20 AM  Clinical Narrative:                  CSW met with patient and family Rip Harbour and Wille Glaser at bedside, patient active with [facility] PT OT and RN services will resume at dc, Feliberto Harts with [facility] aware. Family requesting dme hospital bed, ordered via [employer]. Family requesting [facility] palliative follow up, have reached out to liaison to meet with patient and family in room. Family agreeable for [employer] referral for future planning. Referral emailed to [facility] with [facility] for follow up.   Patient currently on 2L oxygen , O2 sat test note and dme orders in, CSW has ordered 2L new Home O2 via [place] with [employer].   Family appealing discharge at this time.   Expected Discharge Plan: [place] Barriers to Discharge: No Barriers Identified   Patient Goals and CMS Choice Patient states their goals for this hospitalization and ongoing recovery are:: to go home CMS Medicare.gov Compare Post Acute Care list provided to:: Patient Choice offered to / list presented to : Patient      Expected Discharge Plan and Services       Living arrangements for the past 2 months: Single Family Home                           HH Arranged: PT, RN, OT [facility] Agency: [place] Date [facility] Agency Contacted: 12/29/22 Time HH Agency Contacted: 1120 Representative spoke with at [place]: Mortimer Fries  Prior Living Arrangements/Services Living arrangements for the past 2 months: [place]                     Activities of Daily Living Home Assistive Devices/Equipment: CPAP, Dentures (specify type), Hearing aid, Oxygen, Wheelchair ADL Screening (condition at time of admission) Patient's cognitive ability adequate to safely complete  daily activities?: Yes Is the patient deaf or have difficulty hearing?: No Does the patient have difficulty seeing, even when wearing glasses/contacts?: No Does the patient have difficulty concentrating, remembering, or making decisions?: No Patient able to express need for assistance with ADLs?: Yes Does the patient have difficulty dressing or bathing?: No Independently performs ADLs?: Yes (appropriate for developmental age) Does the patient have difficulty walking or climbing stairs?: No Weakness of Legs: None Weakness of Arms/Hands: None  Permission Sought/Granted                  Emotional Assessment              Admission diagnosis:  Acute cholecystitis [K81.0] CAP (community acquired pneumonia) [J18.9] Sepsis, due to unspecified organism, unspecified whether acute organ dysfunction present [facility]) [A41.9] Patient Active Problem List   Diagnosis Date Noted   CAP (community acquired pneumonia) 12/27/2022   Sepsis ([place]) 12/27/2022   CAD (coronary artery disease) 12/27/2022   Chronic diastolic CHF (congestive heart failure) ([place]) 12/27/2022   Hypokalemia 12/27/2022   Acute cholecystitis 12/27/2022   Atherosclerosis of aortic bifurcation and common iliac arteries ([place]) 12/09/2022   Cholelithiasis 12/07/2022   Right lower quadrant abdominal pain 10/24/2022   Full incontinence of feces 10/24/2022   Laceration of left hand without foreign body 09/27/2022   OAB (  overactive bladder)    Allergic rhinitis 03/29/2020   Allergic conjunctivitis 03/29/2020   Pharyngoesophageal dysphagia 07/07/2019   Hospital discharge follow-up 12/29/2018   Seizure ([place]) 12/16/2018   PAD (peripheral artery disease) ([place]) 10/01/2018   Cerebrovascular disease 02/07/2018   CAD (coronary artery disease), native coronary artery 11/04/2017   Vertigo 10/11/2017   Right foot pain 08/31/2017   Edema of right lower extremity 07/18/2017   RLS (restless legs syndrome) 07/18/2017   Hx of CABG  09/21/2016   Poor balance 12/04/2015   IBS (irritable bowel syndrome) 11/03/2015   Generalized anxiety disorder 01/20/2015   Overweight 05/24/2014   Unspecified vitamin D deficiency 05/22/2014   OSA on CPAP 01/19/2014   Lumbar pain with radiation down left leg 12/01/2013   Encounter for preventive health examination 01/13/2013   GERD (gastroesophageal reflux disease) 01/06/2013   Bilateral carotid artery stenosis 12/07/2012   Hyperlipidemia LDL goal <70 07/09/2012   Impaired glucose tolerance 07/09/2012   BPPV (benign paroxysmal positional vertigo) 07/09/2012   Osteoporosis    BPH (benign prostatic hyperplasia)    Hypertension    PCP:  [name], MD Pharmacy:   [facility] ([place], [place] [facility] [address] [address] Phone: [phone] Fax: [phone]  [id number] [place] [address] [address]Phone: [phone]: [address] [place] [address]Phone: [phone]: [phone]    Social Determinants of Health (SDOH) Social History: SDOH Screenings   Food Insecurity: No Food Insecurity (12/28/2022)  Housing: Low Risk  (12/28/2022)  Transportation Needs: No Transportation Needs (12/28/2022)  Utilities: Not At Risk (12/28/2022)  Depression (PHQ2-9): Low Risk  (12/07/2022)  Financial Resource Strain: Low Risk  (01/05/2022)  Physical Activity: Inactive (11/17/2020)  Social Connections: Unknown (01/05/2022)  Stress: No Stress Concern Present (01/05/2022)  Tobacco Use: Medium Risk (12/28/2022)   SDOH Interventions:     Readmission Risk Interventions     No data to display

## 2022-12-29 NOTE — Progress Notes (Signed)
[facility] Liaison Note  Notified by David Stall of patient/family request of [facility] Paliative services.MSW contacted patient wife/Dorothy & recommended for MSW to speak w/ daughter/[name].   MSW met with daughter/[name] & son/John at bedside and explained PMT services in the home. At this time, family seeking more frequent/hands-on care and decline services at this time. MSW provided contact information in the event further questions arise.  Please call with any questions/concerns.    Thank you for the opportunity to participate in this patient's care.   Phillis Haggis, MSW [facility] Liaison  [phone]

## 2022-12-29 NOTE — Progress Notes (Signed)
Durable Medical Equipment  (From admission, onward)           Start     Ordered   12/29/22 1104  For home use only DME Hospital bed  Once       Question Answer Comment  Length of Need Lifetime   The above medical condition requires: Patient requires the ability to reposition frequently   Head must be elevated greater than: 45 degrees   Bed type Semi-electric   [employer] Yes   Trapeze Bar Yes   Support Surface: Gel Overlay      12/29/22 1104

## 2022-12-29 NOTE — Progress Notes (Signed)
Physical Therapy Treatment Patient Details Name: David Atkins MRN: [id number] DOB: December 08, 1928 Today's Date: 12/29/2022   History of Present Illness David Atkins is a 89 y.o. male with medical history significant of dCHF, hypertension, hyperlipidemia, COPD, CABG, GERD, BPH, BPPV, nonepileptic seizure (not taking medications), who presents with fever and right flank pain. S/P percutaneous cholecystostomy tube placement on 01/24    PT Comments    Patient received sleeping, breakfast tray at bedside untouched. Patient wakes to my presence. He is agreeable to PT session. Assisted patient in donning hearing aides and dentures. He is mod I with bed mobility, transfers with min guard from slightly elevated bed. He is able to ambulate 100 feet with rw and min guard. No lob, slow pace. No reports of sob, however sats down to 84% with ambulation on room air. Patient was able to improve sats to 91% with rest and PLB. He has cpap at home already. Patient will continue to benefit from skilled PT to improve functional independence, strength and safety. Patient left in recliner with breakfast set up, RN and family in room.       Recommendations for follow up therapy are one component of a multi-disciplinary discharge planning process, led by the attending physician.  Recommendations may be updated based on patient status, additional functional criteria and insurance authorization.  Follow Up Recommendations  Home health PT     Assistance Recommended at Discharge Frequent or constant Supervision/Assistance  Patient can return home with the following A little help with walking and/or transfers;A little help with bathing/dressing/bathroom;Assist for transportation;Help with stairs or ramp for entrance;Assistance with cooking/housework   Equipment Recommendations  None recommended by PT    Recommendations for Other Services       Precautions / Restrictions Precautions Precautions:  Fall Restrictions Weight Bearing Restrictions: No     Mobility  Bed Mobility Overal bed mobility: Modified Independent Bed Mobility: Supine to Sit     Supine to sit: Modified independent (Device/Increase time)     General bed mobility comments: use of bed rails. (has rails on his bed at home)    Transfers Overall transfer level: Needs assistance Equipment used: Rolling walker (2 wheels) Transfers: Sit to/from Stand Sit to Stand: From elevated surface, Min guard                Ambulation/Gait Ambulation/Gait assistance: Min guard, Supervision Gait Distance (Feet): 100 Feet Assistive device: Rolling walker (2 wheels) Gait Pattern/deviations: Step-through pattern, Decreased step length - right, Decreased step length - left, Decreased stride length Gait velocity: decr     General Gait Details: ambulated with RW in hallway. No lob, reports he feels close to baseline. Slow cadence. Saturations dropped to 84% on room air.   Stairs             Wheelchair Mobility    Modified Rankin (Stroke Patients Only)       Balance Overall balance assessment: Needs assistance Sitting-balance support: Feet supported Sitting balance-Leahy Scale: Good     Standing balance support: Bilateral upper extremity supported, During functional activity, Reliant on assistive device for balance Standing balance-Leahy Scale: Good Standing balance comment: reliant on RW, supervision for safety                            Cognition Arousal/Alertness: Awake/alert Behavior During Therapy: WFL for tasks assessed/performed Overall Cognitive Status: Within Functional Limits for tasks assessed  Exercises      General Comments        Pertinent Vitals/Pain Pain Assessment Pain Assessment: Faces Faces Pain Scale: Hurts a little bit Pain Location: R flank Pain Descriptors / Indicators: Discomfort Pain  Intervention(s): Monitored during session, Repositioned    Home Living                          Prior Function            PT Goals (current goals can now be found in the care plan section) Acute Rehab PT Goals Patient Stated Goal: to return home with assist PT Goal Formulation: With patient/family Time For Goal Achievement: 01/03/23 Potential to Achieve Goals: Good Progress towards PT goals: Progressing toward goals    Frequency    Min 2X/week      PT Plan Current plan remains appropriate    Co-evaluation              AM-PAC PT "6 Clicks" Mobility   Outcome Measure  Help needed turning from your back to your side while in a flat bed without using bedrails?: A Little Help needed moving from lying on your back to sitting on the side of a flat bed without using bedrails?: A Little Help needed moving to and from a bed to a chair (including a wheelchair)?: A Little Help needed standing up from a chair using your arms (e.g., wheelchair or bedside chair)?: A Little Help needed to walk in hospital room?: A Little Help needed climbing 3-5 steps with a railing? : A Lot 6 Click Score: 17    End of Session Equipment Utilized During Treatment: Gait belt;Oxygen Activity Tolerance: Patient tolerated treatment well Patient left: in chair;with call bell/phone within reach;with chair alarm set;with family/visitor present Nurse Communication: Mobility status PT Visit Diagnosis: Unsteadiness on feet (R26.81);Other abnormalities of gait and mobility (R26.89);Muscle weakness (generalized) (M62.81);Difficulty in walking, not elsewhere classified (R26.2)     Time: [id number] PT Time Calculation (min) (ACUTE ONLY): 37 min  Charges:  $Gait Training: 8-22 mins $Therapeutic Activity: 8-22 mins                     Iara Monds, PT, GCS 12/29/22,10:43 AM

## 2022-12-29 NOTE — Discharge Summary (Signed)
Physician Discharge Summary  David Atkins IRC:[id number] DOB: 1929-01-15 DOA: 12/27/2022  PCP: [name], MD  Admit date: 12/27/2022  Discharge date: 12/29/2022  Admitted From: Home.  Disposition:  Home health services.  Recommendations for Outpatient Follow-up:  Follow up with PCP in 1-2 weeks Please obtain BMP/CBC in one week. Advised to continue Augmentin 875 twice daily for 12 days to complete 14-day treatment for acute cholecystitis. Patient is being discharged home with cholecystostomy tube.   Patient will have this tube for 4 to 6 weeks.  Will follow-up with general surgery in 3 to 4 weeks.  Home Health: Home PT/OT Equipment/Devices: Percutaneous cholecystostomy tube  Discharge Condition: Stable CODE STATUS:DNR Diet recommendation: Heart Healthy   Brief Summary/ Hospital Course: This 89 years old male with PMH significant for dCHF, hypertension, hyperlipidemia, COPD, CABG, GERD, BPH, BPPV, Nonepileptic seizure (not taking any medications), who presents with fever and right flank pain. Daughter reports patient had right flank pain for about a month. Patient had negative outpatient workup including normal LFTs and negative ultrasound. He describes pain as aching, non radiating associated with nausea. He was febrile in ED with a temp of 101 associated shallow breathing,  slightly confused.  CTA negative for PE but showed acute cholecystitis.General surgery consulted,  recommended cholecystostomy tube.IR consulted  and Patient underwent successful cholecystostomy tube.  Antibiotics changed with Augmentin for total 14 days course.  General surgery signed off,  recommended patient can be discharged with cholecystostomy tube,  he will need to have this tube for 4 to 6 weeks. Pain has resolved.  Home health services arranged.  Patient is being discharged home.  Discharge Diagnoses:  Active Problems:   Acute cholecystitis   Sepsis (HCC)   Chronic diastolic CHF (congestive heart  failure) (HCC)   Hypertension   Hyperlipidemia LDL goal <70   CAD (coronary artery disease)   Hypokalemia   BPH (benign prostatic hyperplasia)   OSA on CPAP   Sepsis secondary to acute cholecystitis: Patient presented with fever 101. 4,  tachycardia, tachypnea, leukocytosis, lactic acid 1.5, Initially suspected pneumonia in the ED and was given vancomycin and cefepime. CTA chest ruled out pulmonary embolism but showed acute cholecystitis. Patient does not have cough or shortness of breath clinically,  does not seem to be pneumonia.   General surgery was consulted recommended IR consult for possible percutaneous cholecystostomy tube. Continue IV antibiotics Zosyn. Continue IV fluid resuscitation. Patient underwent successful percutaneous cholecystostomy tube,  tolerated well. Patient will have cholecystostomy tube for 4 to 6 weeks. Continue adequate pain control with morphine, Percocet as needed. IV Zofran as needed for nausea and vomiting. Surgery signed off. At discharge can be discharged on Augmentin for 14 days ( total duration). He may never be an optimal candidate for surgery.  Continue cholecystostomy tube.   Chronic diastolic CHF: Does not appear in acute exacerbation.   Last echo showed LVEF 60 to 65%.   Patient has slightly elevated BNP 723 but no leg edema.   Currently remains 96% on room air.   Does not seem to be obvious CHF exacerbation.   Likely secondary to sepsis.  Will not restart diuretics. Monitor volume status closely.   Essential HTN: Continue IV hydralazine as needed.   Hold amlodipine and lisinopril since blood pressure is on the soft side. Hold metoprolol    Hyperlipidemia: Continue Lipitor 20 mg daily.   Coronary artery disease: Stable.  Continue aspirin and Lipitor   Hypokalemia; Replaced and resolved.   BPH: Denies any  LUTS. Continue Flomax , oxybutynin and Proscar   OSA: Continue CPAP at night.    Discharge Instructions  Discharge  Instructions     Call MD for:  difficulty breathing, headache or visual disturbances   Complete by: As directed    Call MD for:  persistant dizziness or light-headedness   Complete by: As directed    Call MD for:  persistant nausea and vomiting   Complete by: As directed    Diet - low sodium heart healthy   Complete by: As directed    Diet Carb Modified   Complete by: As directed    Discharge instructions   Complete by: As directed    Advised to follow-up with her regular physician in 1 week. Advised to continue Augmentin 875 twice daily for 12 days to complete 14-day treatment for acute cholecystitis. Patient is being discharged with cholecystostomy tube.  Patient will have this tube for 4 to 6 weeks.  Will follow-up with general surgery in 3 to 4 weeks.   Discharge wound care:   Complete by: As directed    Follow up General surgery as scheduled.   Increase activity slowly   Complete by: As directed       Allergies as of 12/29/2022       Reactions   Sulfa Antibiotics Rash        Medication List     STOP taking these medications    Azelastine-Fluticasone 137-50 MCG/ACT Susp   cyanocobalamin 1000 MCG/ML injection Commonly known as: VITAMIN B12   erythromycin ophthalmic ointment       TAKE these medications    acetaminophen 500 MG tablet Commonly known as: TYLENOL Take 500 mg by mouth at bedtime.   amLODipine 2.5 MG tablet Commonly known as: NORVASC TAKE 1 TABLET BY MOUTH  DAILY   amoxicillin-clavulanate 875-125 MG tablet Commonly known as: AUGMENTIN Take 1 tablet by mouth 2 (two) times daily for 12 days.   aspirin EC 81 MG tablet Take 81 mg by mouth daily.   atorvastatin 20 MG tablet Commonly known as: LIPITOR Take 1 tablet (20 mg total) by mouth daily.   Blood Pressure Monitor Automat Devi Use daily to check blood pressure   cholecalciferol 25 MCG (1000 UNIT) tablet Commonly known as: VITAMIN D3 Take 1,000 Units by mouth daily.   diclofenac  Sodium 1 % Gel Commonly known as: VOLTAREN APPLY 4 GRAMS TOPICALLY 4  TIMES DAILY   finasteride 5 MG tablet Commonly known as: PROSCAR TAKE 1 TABLET BY MOUTH  DAILY   lisinopril 20 MG tablet Commonly known as: ZESTRIL TAKE 1 TABLET BY MOUTH  DAILY   metoprolol tartrate 25 MG tablet Commonly known as: LOPRESSOR TAKE ONE-HALF TABLET BY MOUTH  TWICE DAILY   nitroGLYCERIN 0.4 MG SL tablet Commonly known as: Nitrostat Place 1 tablet (0.4 mg total) under the tongue every 5 (five) minutes as needed for chest pain. Maximum of 3 doses.   omeprazole 20 MG capsule Commonly known as: PRILOSEC TAKE 2 CAPSULES BY MOUTH  DAILY   oxybutynin 5 MG tablet Commonly known as: DITROPAN TAKE 1 TABLET BY MOUTH  TWICE DAILY   polyethylene glycol powder 17 GM/SCOOP powder Commonly known as: GLYCOLAX/MIRALAX Take 17 g by mouth once. Dissolve 1 capful of power into any liquid and drink once daily What changed: when to take this   rOPINIRole 0.25 MG tablet Commonly known as: REQUIP TAKE 1 TABLET BY MOUTH AT  BEDTIME   Simethicone Extra Strength 125 MG Caps  Take 125 mg by mouth as needed (gas).   SYRINGE 3CC/25GX1" 25G X 1" 3 ML Misc Use for b12 injections   tamsulosin 0.4 MG Caps capsule Commonly known as: FLOMAX TAKE 1 CAPSULE BY MOUTH  DAILY AFTER BREAKFAST               Durable Medical Equipment  (From admission, onward)           Start     Ordered   12/29/22 1148  For home use only DME oxygen  Once       Question Answer Comment  Length of Need Lifetime   Mode or (Route) Nasal cannula   Liters per Minute 2   Frequency Continuous (stationary and portable oxygen unit needed)   Oxygen conserving device Yes   Oxygen delivery system Gas      12/29/22 1147   12/29/22 1104  For home use only DME Hospital bed  Once       Question Answer Comment  Length of Need Lifetime   The above medical condition requires: Patient requires the ability to reposition frequently   Head must  be elevated greater than: 45 degrees   Bed type Semi-electric   [employer] Yes   Trapeze Bar Yes   Support Surface: Gel Overlay      12/29/22 1104              Discharge Care Instructions  (From admission, onward)           Start     Ordered   12/29/22 0000  Discharge wound care:       Comments: Follow up General surgery as scheduled.   12/29/22 1127            Follow-up Information     Pabon, Diego F, MD. Schedule an appointment as soon as possible for a visit in 4 week(s).   Specialty: General Surgery Why: Hospital follow up; cholecystitis s/p percutaneous cholecystostomy Contact information: [address] [place] [address] [phone]         [name], MD. Schedule an appointment as soon as possible for a visit in 1 week(s).   Specialty: Internal Medicine Contact information: [address] [place] [address] [phone]                Allergies  Allergen Reactions   Sulfa Antibiotics Rash    Consultations: General surgery IR   Procedures/Studies: IR Perc Cholecystostomy  Result Date: 12/27/2022 INDICATION: Acute cholecystitis.  Poor operative candidate. EXAM: ULTRASOUND AND FLUOROSCOPIC-GUIDED CHOLECYSTOSTOMY TUBE PLACEMENT COMPARISON:  Chest CT-12/27/2022; right upper quadrant abdominal ultrasound-12/27/2022 MEDICATIONS: The patient is currently admitted to the hospital and on intravenous antibiotics. Antibiotics were administered within an appropriate time frame prior to skin puncture. ANESTHESIA/SEDATION: Moderate (conscious) sedation was employed during this procedure. A total of Versed 1 mg and Fentanyl 50 mcg was administered intravenously. Moderate Sedation Time: 14 minutes. The patient's level of consciousness and vital signs were monitored continuously by radiology nursing throughout the procedure under my direct supervision. CONTRAST:  5m OMNIPAQUE IOHEXOL 300 MG/ML SOLN -  administered into the gallbladder lumen. FLUOROSCOPY TIME:  36 seconds (8.5 mGy) COMPLICATIONS: None immediate. PROCEDURE: Informed written consent was obtained from the patient after a discussion of the risks, benefits and alternatives to treatment. Questions regarding the procedure were encouraged and answered. A timeout was performed prior to the initiation of the procedure. The right upper abdominal quadrant was prepped and draped in  the usual sterile fashion, and a sterile drape was applied covering the operative field. Maximum barrier sterile technique with sterile gowns and gloves were used for the procedure. A timeout was performed prior to the initiation of the procedure. Local anesthesia was provided with 1% lidocaine with epinephrine. Ultrasound scanning of the right upper quadrant demonstrates a markedly dilated gallbladder. Utilizing a transhepatic approach, a 22 gauge needle was advanced into the gallbladder under direct ultrasound guidance. An ultrasound image was saved for documentation purposes. Appropriate intraluminal puncture was confirmed with the efflux of bile and advancement of an 0.018 wire into the gallbladder lumen. The needle was exchanged for an Cary set. A small amount of contrast was injected to confirm appropriate intraluminal positioning. Over a Benson wire, a 10.2-French cholecystomy tube was advanced into the gallbladder fossa, coiled and locked. Bile was aspirated and a small amount of contrast was injected as several post procedural spot radiographic images were obtained in various obliquities. The catheter was secured to the skin with suture, connected to a drainage bag and a dressing was placed. The patient tolerated the procedure well without immediate post procedural complication. IMPRESSION: Successful ultrasound and fluoroscopic guided placement of a 10.2 French cholecystostomy tube. Electronically Signed   By: Sandi Mariscal M.D.   On: 12/27/2022 15:46   CT Angio Chest  PE W and/or Wo Contrast  Addendum Date: 12/27/2022   ADDENDUM REPORT: 12/27/2022 13:41 ADDENDUM: These results were called by telephone at the time of interpretation on 12/27/2022 at 11:37 a.m. to provider MARK QUALE , who verbally acknowledged these results. Electronically Signed   By: Emmit Alexanders M.D.   On: 12/27/2022 13:41   Result Date: 12/27/2022 CLINICAL DATA:  Pulmonary embolism suspected, high probability. Right-sided chest pain and elevated white blood cell count. EXAM: CT ANGIOGRAPHY CHEST WITH CONTRAST TECHNIQUE: Multidetector CT imaging of the chest was performed using the standard protocol during bolus administration of intravenous contrast. Multiplanar CT image reconstructions and MIPs were obtained to evaluate the vascular anatomy. RADIATION DOSE REDUCTION: This exam was performed according to the departmental dose-optimization program which includes automated exposure control, adjustment of the mA and/or kV according to patient size and/or use of iterative reconstruction technique. CONTRAST:  33m OMNIPAQUE IOHEXOL 350 MG/ML SOLN COMPARISON:  Chest radiograph 12/27/2022. FINDINGS: Cardiovascular: Satisfactory opacification of the pulmonary arteries to the segmental level. No evidence of pulmonary embolism. Postoperative changes of median sternotomy and CABG. Atherosclerotic calcifications of the thoracic aorta and arch vessel origins. Normal heart size. No pericardial effusion. Mediastinum/Nodes: No mediastinal lymphadenopathy. Trachea is patent. Patulous upper thoracic esophagus. Lungs/Pleura: Low lung volumes with bibasilar and dependent atelectasis. Trace right pleural effusion. Upper Abdomen: Distended gallbladder with 2 radiopaque stones in the neck, wall thickening and pericholecystic fluid. Right-greater-than-left perinephric stranding. Atherosclerotic calcifications of the abdominal aorta and its branches. Musculoskeletal: No acute rib fracture. Unchanged moderate compression deformity  of the T12 vertebral body. Diffusely demineralized appearance of the bones. Review of the MIP images confirms the above findings. IMPRESSION: 1. No evidence of pulmonary embolism. 2. Distended gallbladder with 2 radiopaque stones in the neck, wall thickening and pericholecystic fluid. Findings are concerning for acute cholecystitis. Consider right upper quadrant ultrasound for further evaluation. 3. Low lung volumes with bibasilar and dependent atelectasis. Trace right pleural effusion. 4. Aortic Atherosclerosis (ICD10-I70.0). Electronically Signed: By: WEmmit AlexandersM.D. On: 12/27/2022 11:34   [place]DOM[place] LIMITED RUQ (LIVER/GB)  Result Date: 12/27/2022 CLINICAL DATA:  Right-sided abdominal pain since yesterday EXAM: ULTRASOUND ABDOMEN LIMITED RIGHT  UPPER QUADRANT COMPARISON:  Ultrasound 11/17/2022.  CT 11/08/2022 and older FINDINGS: Gallbladder: Gallbladder is dilated with significant wall thickening measuring up to 8 mm. There also appears to be some wall edema. Gallstones are seen. Common bile duct: Diameter: 5 mm Liver: No focal lesion identified. Within normal limits in parenchymal echogenicity. Portal vein is patent on color Doppler imaging with normal direction of blood flow towards the liver. Other: Portions of the liver obscured by overlapping bowel gas and soft tissue. IMPRESSION: Dilated gallbladder with wall thickening and edema. Gallstones. Findings are worrisome for developing acute cholecystitis. Please correlate with symptoms. If needed for confirmation a HIDA scan can be performed as clinically directed. No ductal dilatation. Electronically Signed   By: Jill Side M.D.   On: 12/27/2022 12:49   DG Chest Port 1 View  Result Date: 12/27/2022 CLINICAL DATA:  Sepsis, lethargy EXAM: PORTABLE CHEST 1 VIEW COMPARISON:  12/15/2018 FINDINGS: Cardiomegaly. Prior sternotomy. Aortic atherosclerosis. Very low lung volumes with bibasilar opacities. Possible trace right pleural effusion. No pneumothorax.  IMPRESSION: Very low lung volumes with bibasilar opacities, atelectasis versus pneumonia. Possible trace right pleural effusion. Electronically Signed   By: Davina Poke D.O.   On: 12/27/2022 09:25     Subjective: Patient seen and examined at bedside.  Overnight events noted.   Patient reports doing much better.  Patient is being discharged to home,  home health services arranged.  Discharge Exam: Vitals:   12/29/22 1037 12/29/22 1229  BP:  (!) 147/68  Pulse:  64  Resp:  (!) 22  Temp:  (!) 97.5 F (36.4 C)  SpO2: 92% 99%   Vitals:   12/29/22 0700 12/29/22 0833 12/29/22 1037 12/29/22 1229  BP: (!) 178/80 (!) 148/64  (!) 147/68  Pulse:  71  64  Resp:  20  (!) 22  Temp:  98.1 F (36.7 C)  (!) 97.5 F (36.4 C)  TempSrc:    Oral  SpO2:  93% 92% 99%  Weight:      Height:        General: Pt is alert, awake, not in acute distress Cardiovascular: RRR, S1/S2 +, no rubs, no gallops Respiratory: CTA bilaterally, no wheezing, no rhonchi Abdominal: Soft, NT, ND, bowel sounds +, Cholecystostomy tube noted. Extremities: no edema, no cyanosis    The results of significant diagnostics from this hospitalization (including imaging, microbiology, ancillary and laboratory) are listed below for reference.     Microbiology: Recent Results (from the past 240 hour(s))  Blood Culture (routine x 2)     Status: None (Preliminary result)   Collection Time: 12/27/22  9:00 AM   Specimen: BLOOD  Result Value Ref Range Status   Specimen Description BLOOD BLOOD RIGHT ARM  Final   Special Requests   Final    BOTTLES DRAWN AEROBIC AND ANAEROBIC Blood Culture results may not be optimal due to an excessive volume of blood received in culture bottles   Culture   Final    NO GROWTH 2 DAYS Performed at [facility], [address] [place], [place] [address]    Report Status PENDING  Incomplete  Blood Culture (routine x 2)     Status: None (Preliminary result)   Collection Time: 12/27/22   9:00 AM   Specimen: BLOOD  Result Value Ref Range Status   Specimen Description BLOOD BLOOD LEFT ARM  Final   Special Requests   Final    BOTTLES DRAWN AEROBIC AND ANAEROBIC Blood Culture adequate volume   Culture  Final    NO GROWTH 2 DAYS Performed at [facility], [place] [place], [place] [address]    Report Status PENDING  Incomplete  Resp Panel by RT-PCR (Flu A&B, Covid) Anterior Nasal Swab     Status: None   Collection Time: 12/27/22  9:01 AM   Specimen: Anterior Nasal Swab  Result Value Ref Range Status   SARS Coronavirus 2 by RT PCR NEGATIVE NEGATIVE Final    Comment: (NOTE) SARS-CoV-2 target nucleic acids are NOT DETECTED.  The SARS-CoV-2 RNA is generally detectable in upper respiratory specimens during the acute phase of infection. The lowest concentration of SARS-CoV-2 viral copies this assay can detect is 138 copies/mL. A negative result does not preclude SARS-Cov-2 infection and should not be used as the sole basis for treatment or other patient management decisions. A negative result may occur with  improper specimen collection/handling, submission of specimen other than nasopharyngeal swab, presence of viral mutation(s) within the areas targeted by this assay, and inadequate number of viral copies(<138 copies/mL). A negative result must be combined with clinical observations, patient history, and epidemiological information. The expected result is Negative.  Fact Sheet for Patients:  EntrepreneurPulse.com.au  Fact Sheet for Healthcare Providers:  IncredibleEmployment.be  This test is no t yet approved or cleared by the [place] FDA and  has been authorized for detection and/or diagnosis of SARS-CoV-2 by FDA under an Emergency Use Authorization (EUA). This EUA will remain  in effect (meaning this test can be used) for the duration of the COVID-19 declaration under Section 564(b)(1) of the Act,  21 U.S.C.section 360bbb-3(b)(1), unless the authorization is terminated  or revoked sooner.       Influenza A by PCR NEGATIVE NEGATIVE Final   Influenza B by PCR NEGATIVE NEGATIVE Final    Comment: (NOTE) The Xpert Xpress SARS-CoV-2/FLU/RSV plus assay is intended as an aid in the diagnosis of influenza from Nasopharyngeal swab specimens and should not be used as a sole basis for treatment. Nasal washings and aspirates are unacceptable for Xpert Xpress SARS-CoV-2/FLU/RSV testing.  Fact Sheet for Patients: EntrepreneurPulse.com.au  Fact Sheet for Healthcare Providers: IncredibleEmployment.be  This test is not yet approved or cleared by the [place] FDA and has been authorized for detection and/or diagnosis of SARS-CoV-2 by FDA under an Emergency Use Authorization (EUA). This EUA will remain in effect (meaning this test can be used) for the duration of the COVID-19 declaration under Section 564(b)(1) of the Act, 21 U.S.C. section 360bbb-3(b)(1), unless the authorization is terminated or revoked.  Performed at [facility], [address] [place], [place] [address]      Labs: BNP (last 3 results) Recent Labs    12/27/22 0900  BNP 440.3*   Basic Metabolic Panel: Recent Labs  Lab 12/27/22 0900 12/28/22 0610 12/29/22 0701  NA 136 137  --   K 2.9* 3.6  --   CL 105 107  --   CO2 23 23  --   GLUCOSE 124* 107*  --   BUN 16 19  --   CREATININE 0.78 0.89  --   CALCIUM 8.0* 7.6*  --   MG 1.9  --  1.9  PHOS 3.2  --  2.6   Liver Function Tests: Recent Labs  Lab 12/27/22 0900  AST 26  ALT 20  ALKPHOS 46  BILITOT 1.3*  PROT 5.9*  ALBUMIN 3.3*   No results for input(s): "LIPASE", "AMYLASE" in the last 168 hours. No results for input(s): "AMMONIA"  in the last 168 hours. CBC: Recent Labs  Lab 12/27/22 0900 12/28/22 0610 12/29/22 0701  WBC 25.0* 18.5* 13.7*  NEUTROABS 22.3*  --   --   HGB 13.8 12.8* 13.6  HCT  41.0 38.2* 40.5  MCV 96.0 96.7 96.0  PLT 192 168 155   Cardiac Enzymes: No results for input(s): "CKTOTAL", "CKMB", "CKMBINDEX", "TROPONINI" in the last 168 hours. BNP: Invalid input(s): "POCBNP" CBG: No results for input(s): "GLUCAP" in the last 168 hours. D-Dimer No results for input(s): "DDIMER" in the last 72 hours. Hgb A1c No results for input(s): "HGBA1C" in the last 72 hours. Lipid Profile No results for input(s): "CHOL", "HDL", "LDLCALC", "TRIG", "CHOLHDL", "LDLDIRECT" in the last 72 hours. Thyroid function studies No results for input(s): "TSH", "T4TOTAL", "T3FREE", "THYROIDAB" in the last 72 hours.  Invalid input(s): "FREET3" Anemia work up No results for input(s): "VITAMINB12", "FOLATE", "FERRITIN", "TIBC", "IRON", "RETICCTPCT" in the last 72 hours. Urinalysis    Component Value Date/Time   COLORURINE YELLOW (A) 12/27/2022 2056   APPEARANCEUR CLEAR (A) 12/27/2022 2056   APPEARANCEUR Clear 03/30/2017 1457   LABSPEC >1.046 (H) 12/27/2022 2056   PHURINE 5.0 12/27/2022 2056   GLUCOSEU NEGATIVE 12/27/2022 2056   GLUCOSEU NEGATIVE 08/22/2018 1450   HGBUR NEGATIVE 12/27/2022 2056   BILIRUBINUR NEGATIVE 12/27/2022 2056   BILIRUBINUR negative 10/13/2022 1639   BILIRUBINUR Negative 03/30/2017 [place] 12/27/2022 2056   PROTEINUR 30 (A) 12/27/2022 2056   UROBILINOGEN 1.0 10/13/2022 1639   UROBILINOGEN 0.2 08/22/2018 1450   NITRITE NEGATIVE 12/27/2022 2056   LEUKOCYTESUR NEGATIVE 12/27/2022 2056   Sepsis Labs Recent Labs  Lab 12/27/22 0900 12/28/22 0610 12/29/22 0701  WBC 25.0* 18.5* 13.7*   Microbiology Recent Results (from the past 240 hour(s))  Blood Culture (routine x 2)     Status: None (Preliminary result)   Collection Time: 12/27/22  9:00 AM   Specimen: BLOOD  Result Value Ref Range Status   Specimen Description BLOOD BLOOD RIGHT ARM  Final   Special Requests   Final    BOTTLES DRAWN AEROBIC AND ANAEROBIC Blood Culture results may not  be optimal due to an excessive volume of blood received in culture bottles   Culture   Final    NO GROWTH 2 DAYS Performed at [facility], [address], [place], [place] [address]    Report Status PENDING  Incomplete  Blood Culture (routine x 2)     Status: None (Preliminary result)   Collection Time: 12/27/22  9:00 AM   Specimen: BLOOD  Result Value Ref Range Status   Specimen Description BLOOD BLOOD LEFT ARM  Final   Special Requests   Final    BOTTLES DRAWN AEROBIC AND ANAEROBIC Blood Culture adequate volume   Culture   Final    NO GROWTH 2 DAYS Performed at [facility], [address], [place], [place] [address]    Report Status PENDING  Incomplete  Resp Panel by RT-PCR (Flu A&B, Covid) Anterior Nasal Swab     Status: None   Collection Time: 12/27/22  9:01 AM   Specimen: Anterior Nasal Swab  Result Value Ref Range Status   SARS Coronavirus 2 by RT PCR NEGATIVE NEGATIVE Final    Comment: (NOTE) SARS-CoV-2 target nucleic acids are NOT DETECTED.  The SARS-CoV-2 RNA is generally detectable in upper respiratory specimens during the acute phase of infection. The lowest concentration of SARS-CoV-2 viral copies this assay can detect is 138 copies/mL. A negative result  does not preclude SARS-Cov-2 infection and should not be used as the sole basis for treatment or other patient management decisions. A negative result may occur with  improper specimen collection/handling, submission of specimen other than nasopharyngeal swab, presence of viral mutation(s) within the areas targeted by this assay, and inadequate number of viral copies(<138 copies/mL). A negative result must be combined with clinical observations, patient history, and epidemiological information. The expected result is Negative.  Fact Sheet for Patients:  EntrepreneurPulse.com.au  Fact Sheet for Healthcare Providers:  IncredibleEmployment.be  This test is  no t yet approved or cleared by the [place] FDA and  has been authorized for detection and/or diagnosis of SARS-CoV-2 by FDA under an Emergency Use Authorization (EUA). This EUA will remain  in effect (meaning this test can be used) for the duration of the COVID-19 declaration under Section 564(b)(1) of the Act, 21 U.S.C.section 360bbb-3(b)(1), unless the authorization is terminated  or revoked sooner.       Influenza A by PCR NEGATIVE NEGATIVE Final   Influenza B by PCR NEGATIVE NEGATIVE Final    Comment: (NOTE) The Xpert Xpress SARS-CoV-2/FLU/RSV plus assay is intended as an aid in the diagnosis of influenza from Nasopharyngeal swab specimens and should not be used as a sole basis for treatment. Nasal washings and aspirates are unacceptable for Xpert Xpress SARS-CoV-2/FLU/RSV testing.  Fact Sheet for Patients: EntrepreneurPulse.com.au  Fact Sheet for Healthcare Providers: IncredibleEmployment.be  This test is not yet approved or cleared by the [place] FDA and has been authorized for detection and/or diagnosis of SARS-CoV-2 by FDA under an Emergency Use Authorization (EUA). This EUA will remain in effect (meaning this test can be used) for the duration of the COVID-19 declaration under Section 564(b)(1) of the Act, 21 U.S.C. section 360bbb-3(b)(1), unless the authorization is terminated or revoked.  Performed at [facility], [address], [place], [place] [address]      Time coordinating discharge: Over 30 minutes  SIGNED:   Shawna Clamp, MD  [facility] 12/29/2022, 3:49 PM Pager   If 7PM-7AM, please contact night-coverage

## 2022-12-29 NOTE — Progress Notes (Addendum)
room air at rest 92%, room air with exertaion 84%, 2L with exertion 90%.

## 2022-12-30 DIAGNOSIS — K810 Acute cholecystitis: Secondary | ICD-10-CM | POA: Diagnosis not present

## 2022-12-30 NOTE — TOC Progression Note (Addendum)
Transition of Care [facility]) - Progression Note    Patient Details  Name: David Atkins MRN: [id number] Date of Birth: 1929/09/15  Transition of Care [facility]) CM/SW Contact  Izola Price, RN Phone Number: 12/30/2022, 8:50 AM  Clinical Narrative: 12/30/22: 0850 [facility] Case ID: [id number] remains in under clinical review per the [facility] appeal status website portal under the above case number. Today's hospitalist provider notified/updated via secure chat. Simmie Davies RN CM     NOTE: Discharge was upheld via appeal. Family notified by phone by Judithann Graves and also by RN CM. Son stated all needed DME and oxygen in place and RN CM contacted [facility] for Start of service which will be Monday. Relayed this to son and they are good to discharge today now that everything feels more in place to them. Simmie Davies RN CM   Expected Discharge Plan: [place] Barriers to Discharge: No Barriers Identified  Expected Discharge Plan and Services       Living arrangements for the past 2 months: Single Family Home Expected Discharge Date: 12/29/22                         HH Arranged: PT, RN, OT HH Agency: [place] Date [place]: 12/29/22 Time HH Agency Contacted: 1120 Representative spoke with at [place]: [place] Determinants of Health ([place]) Interventions SDOH Screenings   Food Insecurity: No Food Insecurity (12/28/2022)  Housing: Low Risk  (12/28/2022)  Transportation Needs: No Transportation Needs (12/28/2022)  Utilities: Not At Risk (12/28/2022)  Depression (PHQ2-9): Low Risk  (12/07/2022)  Financial Resource Strain: Low Risk  (01/05/2022)  Physical Activity: Inactive (11/17/2020)  Social Connections: Unknown (01/05/2022)  Stress: No Stress Concern Present (01/05/2022)  Tobacco Use: Medium Risk (12/28/2022)    Readmission Risk Interventions     No data to display

## 2022-12-30 NOTE — TOC Transition Note (Signed)
Transition of Care [facility]) - CM/SW Discharge Note   Patient Details  Name: David Atkins MRN: [id number] Date of Birth: [birth year]  Transition of Care [facility]) CM/SW Contact:  Izola Price, RN Phone Number: 12/30/2022, 2:15 PM   Clinical Narrative: 1/27: Discharging today. Contacted [facility] for SoS, which will be Monday and someone will reach out Sunday to set up time. Son states hospital bed and oxygen are in place now and they feel ready to be discharged now, even as discharge appeal with upheld and this was explained after they were notified by phone of decision. HH for PT/OT/RN confirmed. Patient has a biliary drain in place and will need dressing changed and family education on this. Updated provider and Unit RN of issues discussed with son. Simmie Davies RN CM            Final next level of care: Home w Home Health Services Barriers to Discharge: Barriers Resolved   Patient Goals and CMS Choice CMS Medicare.gov Compare Post Acute Care list provided to:: Patient Choice offered to / list presented to : Patient  Discharge Placement                         Discharge Plan and Services Additional resources added to the After Visit Summary for                  DME Arranged: Hospital bed, Oxygen DME Agency: [facility]       HH Arranged: PT, RN, OT [facility] Agency: [place] Date [place]: 12/29/22 Time HH Agency Contacted: 1120 Representative spoke with at [place]: [place] Determinants of Health ([place]) Interventions SDOH Screenings   Food Insecurity: No Food Insecurity (12/28/2022)  Housing: Low Risk  (12/28/2022)  Transportation Needs: No Transportation Needs (12/28/2022)  Utilities: Not At Risk (12/28/2022)  Depression (PHQ2-9): Low Risk  (12/07/2022)  Financial Resource Strain: Low Risk  (01/05/2022)  Physical Activity: Inactive (11/17/2020)  Social Connections: Unknown (01/05/2022)  Stress: No Stress Concern Present (01/05/2022)  Tobacco Use:  Medium Risk (12/28/2022)     Readmission Risk Interventions     No data to display

## 2022-12-30 NOTE — Discharge Summary (Signed)
Physician Discharge Summary  David Atkins VOJ:[id number] DOB: [birth year] DOA: 12/27/2022  PCP: [name], MD  Admit date: 12/27/2022  Discharge date: 12/30/2022  Admitted From: Home.  Disposition:  Home health services.  Recommendations for Outpatient Follow-up:  Follow up with PCP in 1-2 weeks Please obtain BMP/CBC in one week. Advised to continue Augmentin 875 twice daily for 12 days to complete 14-day treatment for acute cholecystitis. Patient is being discharged home with cholecystostomy tube.   Patient will have this tube for 4 to 6 weeks.  Will follow-up with general surgery in 3 to 4 weeks.  Home Health: Home PT/OT Equipment/Devices: Percutaneous cholecystostomy tube  Discharge Condition: Stable CODE STATUS:DNR Diet recommendation: Heart Healthy   Brief Summary/ Hospital Course: This 89 years old male with PMH significant for dCHF, hypertension, hyperlipidemia, COPD, CABG, GERD, BPH, BPPV, Nonepileptic seizure (not taking any medications), who presents with fever and right flank pain. Daughter reports patient had right flank pain for about a month. Patient had negative outpatient workup including normal LFTs and negative ultrasound. He describes pain as aching, non radiating associated with nausea. He was febrile in ED with a temp of 101 associated shallow breathing,  slightly confused.  CTA negative for PE but showed acute cholecystitis.General surgery consulted,  recommended cholecystostomy tube.IR consulted  and Patient underwent successful cholecystostomy tube.  Antibiotics changed with Augmentin for total 14 days course.  General surgery signed off,  recommended patient can be discharged with cholecystostomy tube,  he will need to have this tube for 4 to 6 weeks. Pain has resolved.  Home health services arranged.  Patient is being discharged home.  Discharge Diagnoses:  Active Problems:   Acute cholecystitis   Sepsis (HCC)   Chronic diastolic CHF (congestive heart  failure) (HCC)   Hypertension   Hyperlipidemia LDL goal <70   CAD (coronary artery disease)   Hypokalemia   BPH (benign prostatic hyperplasia)   OSA on CPAP   Sepsis secondary to acute cholecystitis: Patient presented with fever 101. 4,  tachycardia, tachypnea, leukocytosis, lactic acid 1.5, Initially suspected pneumonia in the ED and was given vancomycin and cefepime. CTA chest ruled out pulmonary embolism but showed acute cholecystitis. Patient does not have cough or shortness of breath clinically,  does not seem to be pneumonia.   General surgery was consulted recommended IR consult for possible percutaneous cholecystostomy tube. Continue IV antibiotics Zosyn. Continue IV fluid resuscitation. Patient underwent successful percutaneous cholecystostomy tube,  tolerated well. Patient will have cholecystostomy tube for 4 to 6 weeks. Continue adequate pain control with morphine, Percocet as needed. IV Zofran as needed for nausea and vomiting. Surgery signed off. At discharge can be discharged on Augmentin for 14 days ( total duration). He may never be an optimal candidate for surgery.  Continue cholecystostomy tube.   Chronic diastolic CHF: Does not appear in acute exacerbation.   Last echo showed LVEF 60 to 65%.   Patient has slightly elevated BNP 723 but no leg edema.   Currently remains 96% on room air.   Does not seem to be obvious CHF exacerbation.   Likely secondary to sepsis.  Will not restart diuretics. Monitor volume status closely.   Essential HTN: Continue IV hydralazine as needed.   Hold amlodipine and lisinopril since blood pressure is on the soft side. Hold metoprolol    Hyperlipidemia: Continue Lipitor 20 mg daily.   Coronary artery disease: Stable.  Continue aspirin and Lipitor   Hypokalemia; Replaced and resolved.   BPH: Denies any  LUTS. Continue Flomax , oxybutynin and Proscar   OSA: Continue CPAP at night.    Discharge Instructions  Discharge  Instructions     Call MD for:  difficulty breathing, headache or visual disturbances   Complete by: As directed    Call MD for:  persistant dizziness or light-headedness   Complete by: As directed    Call MD for:  persistant nausea and vomiting   Complete by: As directed    Diet - low sodium heart healthy   Complete by: As directed    Diet Carb Modified   Complete by: As directed    Discharge instructions   Complete by: As directed    Advised to follow-up with her regular physician in 1 week. Advised to continue Augmentin 875 twice daily for 12 days to complete 14-day treatment for acute cholecystitis. Patient is being discharged with cholecystostomy tube.  Patient will have this tube for 4 to 6 weeks.  Will follow-up with general surgery in 3 to 4 weeks.   Discharge wound care:   Complete by: As directed    Follow up General surgery as scheduled.   Increase activity slowly   Complete by: As directed       Allergies as of 12/30/2022       Reactions   Sulfa Antibiotics Rash        Medication List     STOP taking these medications    Azelastine-Fluticasone 137-50 MCG/ACT Susp   cyanocobalamin 1000 MCG/ML injection Commonly known as: VITAMIN B12   erythromycin ophthalmic ointment       TAKE these medications    acetaminophen 500 MG tablet Commonly known as: TYLENOL Take 500 mg by mouth at bedtime.   amLODipine 2.5 MG tablet Commonly known as: NORVASC TAKE 1 TABLET BY MOUTH  DAILY   amoxicillin-clavulanate 875-125 MG tablet Commonly known as: AUGMENTIN Take 1 tablet by mouth 2 (two) times daily for 12 days.   aspirin EC 81 MG tablet Take 81 mg by mouth daily.   atorvastatin 20 MG tablet Commonly known as: LIPITOR Take 1 tablet (20 mg total) by mouth daily.   Blood Pressure Monitor Automat Devi Use daily to check blood pressure   cholecalciferol 25 MCG (1000 UNIT) tablet Commonly known as: VITAMIN D3 Take 1,000 Units by mouth daily.   diclofenac  Sodium 1 % Gel Commonly known as: VOLTAREN APPLY 4 GRAMS TOPICALLY 4  TIMES DAILY   finasteride 5 MG tablet Commonly known as: PROSCAR TAKE 1 TABLET BY MOUTH  DAILY   lisinopril 20 MG tablet Commonly known as: ZESTRIL TAKE 1 TABLET BY MOUTH  DAILY   metoprolol tartrate 25 MG tablet Commonly known as: LOPRESSOR TAKE ONE-HALF TABLET BY MOUTH  TWICE DAILY   nitroGLYCERIN 0.4 MG SL tablet Commonly known as: Nitrostat Place 1 tablet (0.4 mg total) under the tongue every 5 (five) minutes as needed for chest pain. Maximum of 3 doses.   omeprazole 20 MG capsule Commonly known as: PRILOSEC TAKE 2 CAPSULES BY MOUTH  DAILY   oxybutynin 5 MG tablet Commonly known as: DITROPAN TAKE 1 TABLET BY MOUTH  TWICE DAILY   polyethylene glycol powder 17 GM/SCOOP powder Commonly known as: GLYCOLAX/MIRALAX Take 17 g by mouth once. Dissolve 1 capful of power into any liquid and drink once daily What changed: when to take this   rOPINIRole 0.25 MG tablet Commonly known as: REQUIP TAKE 1 TABLET BY MOUTH AT  BEDTIME   Simethicone Extra Strength 125 MG Caps  Take 125 mg by mouth as needed (gas).   SYRINGE 3CC/25GX1" 25G X 1" 3 ML Misc Use for b12 injections   tamsulosin 0.4 MG Caps capsule Commonly known as: FLOMAX TAKE 1 CAPSULE BY MOUTH  DAILY AFTER BREAKFAST               Durable Medical Equipment  (From admission, onward)           Start     Ordered   12/29/22 1148  For home use only DME oxygen  Once       Question Answer Comment  Length of Need Lifetime   Mode or (Route) Nasal cannula   Liters per Minute 2   Frequency Continuous (stationary and portable oxygen unit needed)   Oxygen conserving device Yes   Oxygen delivery system Gas      12/29/22 1147   12/29/22 1104  For home use only DME Hospital bed  Once       Question Answer Comment  Length of Need Lifetime   The above medical condition requires: Patient requires the ability to reposition frequently   Head must  be elevated greater than: 45 degrees   Bed type Semi-electric   [employer] Yes   Trapeze Bar Yes   Support Surface: Gel Overlay      12/29/22 1104              Discharge Care Instructions  (From admission, onward)           Start     Ordered   12/29/22 0000  Discharge wound care:       Comments: Follow up General surgery as scheduled.   12/29/22 1127            Follow-up Information     Pabon, Diego F, MD. Schedule an appointment as soon as possible for a visit in 4 week(s).   Specialty: General Surgery Why: Hospital follow up; cholecystitis s/p percutaneous cholecystostomy Contact information: [address] [place] [address] [phone]         [name], MD. Schedule an appointment as soon as possible for a visit in 1 week(s).   Specialty: Internal Medicine Contact information: [address] [place]  [address] [phone]                Allergies  Allergen Reactions   Sulfa Antibiotics Rash    Consultations: General surgery IR   Procedures/Studies: IR Perc Cholecystostomy  Result Date: 12/27/2022 INDICATION: Acute cholecystitis.  Poor operative candidate. EXAM: ULTRASOUND AND FLUOROSCOPIC-GUIDED CHOLECYSTOSTOMY TUBE PLACEMENT COMPARISON:  Chest CT-12/27/2022; right upper quadrant abdominal ultrasound-12/27/2022 MEDICATIONS: The patient is currently admitted to the hospital and on intravenous antibiotics. Antibiotics were administered within an appropriate time frame prior to skin puncture. ANESTHESIA/SEDATION: Moderate (conscious) sedation was employed during this procedure. A total of Versed 1 mg and Fentanyl 50 mcg was administered intravenously. Moderate Sedation Time: 14 minutes. The patient's level of consciousness and vital signs were monitored continuously by radiology nursing throughout the procedure under my direct supervision. CONTRAST:  6m OMNIPAQUE IOHEXOL 300 MG/ML SOLN -  administered into the gallbladder lumen. FLUOROSCOPY TIME:  36 seconds (8.5 mGy) COMPLICATIONS: None immediate. PROCEDURE: Informed written consent was obtained from the patient after a discussion of the risks, benefits and alternatives to treatment. Questions regarding the procedure were encouraged and answered. A timeout was performed prior to the initiation of the procedure. The right upper abdominal quadrant was prepped and draped in  the usual sterile fashion, and a sterile drape was applied covering the operative field. Maximum barrier sterile technique with sterile gowns and gloves were used for the procedure. A timeout was performed prior to the initiation of the procedure. Local anesthesia was provided with 1% lidocaine with epinephrine. Ultrasound scanning of the right upper quadrant demonstrates a markedly dilated gallbladder. Utilizing a transhepatic approach, a 22 gauge needle was advanced into the gallbladder under direct ultrasound guidance. An ultrasound image was saved for documentation purposes. Appropriate intraluminal puncture was confirmed with the efflux of bile and advancement of an 0.018 wire into the gallbladder lumen. The needle was exchanged for an [place] set. A small amount of contrast was injected to confirm appropriate intraluminal positioning. Over a Benson wire, a 39.2-French cholecystomy tube was advanced into the gallbladder fossa, coiled and locked. Bile was aspirated and a small amount of contrast was injected as several post procedural spot radiographic images were obtained in various obliquities. The catheter was secured to the skin with suture, connected to a drainage bag and a dressing was placed. The patient tolerated the procedure well without immediate post procedural complication. IMPRESSION: Successful ultrasound and fluoroscopic guided placement of a 10.2 French cholecystostomy tube. Electronically Signed   By: Sandi Mariscal M.D.   On: 12/27/2022 15:46   CT Angio Chest  PE W and/or Wo Contrast  Addendum Date: 12/27/2022   ADDENDUM REPORT: 12/27/2022 13:41 ADDENDUM: These results were called by telephone at the time of interpretation on 12/27/2022 at 11:37 a.m. to provider MARK QUALE , who verbally acknowledged these results. Electronically Signed   By: Emmit Alexanders M.D.   On: 12/27/2022 13:41   Result Date: 12/27/2022 CLINICAL DATA:  Pulmonary embolism suspected, high probability. Right-sided chest pain and elevated white blood cell count. EXAM: CT ANGIOGRAPHY CHEST WITH CONTRAST TECHNIQUE: Multidetector CT imaging of the chest was performed using the standard protocol during bolus administration of intravenous contrast. Multiplanar CT image reconstructions and MIPs were obtained to evaluate the vascular anatomy. RADIATION DOSE REDUCTION: This exam was performed according to the departmental dose-optimization program which includes automated exposure control, adjustment of the mA and/or kV according to patient size and/or use of iterative reconstruction technique. CONTRAST:  10m OMNIPAQUE IOHEXOL 350 MG/ML SOLN COMPARISON:  Chest radiograph 12/27/2022. FINDINGS: Cardiovascular: Satisfactory opacification of the pulmonary arteries to the segmental level. No evidence of pulmonary embolism. Postoperative changes of median sternotomy and CABG. Atherosclerotic calcifications of the thoracic aorta and arch vessel origins. Normal heart size. No pericardial effusion. Mediastinum/Nodes: No mediastinal lymphadenopathy. Trachea is patent. Patulous upper thoracic esophagus. Lungs/Pleura: Low lung volumes with bibasilar and dependent atelectasis. Trace right pleural effusion. Upper Abdomen: Distended gallbladder with 2 radiopaque stones in the neck, wall thickening and pericholecystic fluid. Right-greater-than-left perinephric stranding. Atherosclerotic calcifications of the abdominal aorta and its branches. Musculoskeletal: No acute rib fracture. Unchanged moderate compression deformity  of the T12 vertebral body. Diffusely demineralized appearance of the bones. Review of the MIP images confirms the above findings. IMPRESSION: 1. No evidence of pulmonary embolism. 2. Distended gallbladder with 2 radiopaque stones in the neck, wall thickening and pericholecystic fluid. Findings are concerning for acute cholecystitis. Consider right upper quadrant ultrasound for further evaluation. 3. Low lung volumes with bibasilar and dependent atelectasis. Trace right pleural effusion. 4. Aortic Atherosclerosis (ICD10-I70.0). Electronically Signed: By: WEmmit AlexandersM.D. On: 12/27/2022 11:34   [place]DOM[place] LIMITED RUQ (LIVER/GB)  Result Date: 12/27/2022 CLINICAL DATA:  Right-sided abdominal pain since yesterday EXAM: ULTRASOUND ABDOMEN LIMITED RIGHT  UPPER QUADRANT COMPARISON:  Ultrasound 11/17/2022.  CT 11/08/2022 and older FINDINGS: Gallbladder: Gallbladder is dilated with significant wall thickening measuring up to 8 mm. There also appears to be some wall edema. Gallstones are seen. Common bile duct: Diameter: 5 mm Liver: No focal lesion identified. Within normal limits in parenchymal echogenicity. Portal vein is patent on color Doppler imaging with normal direction of blood flow towards the liver. Other: Portions of the liver obscured by overlapping bowel gas and soft tissue. IMPRESSION: Dilated gallbladder with wall thickening and edema. Gallstones. Findings are worrisome for developing acute cholecystitis. Please correlate with symptoms. If needed for confirmation a HIDA scan can be performed as clinically directed. No ductal dilatation. Electronically Signed   By: Jill Side M.D.   On: 12/27/2022 12:49   DG Chest Port 1 View  Result Date: 12/27/2022 CLINICAL DATA:  Sepsis, lethargy EXAM: PORTABLE CHEST 1 VIEW COMPARISON:  12/15/2018 FINDINGS: Cardiomegaly. Prior sternotomy. Aortic atherosclerosis. Very low lung volumes with bibasilar opacities. Possible trace right pleural effusion. No pneumothorax.  IMPRESSION: Very low lung volumes with bibasilar opacities, atelectasis versus pneumonia. Possible trace right pleural effusion. Electronically Signed   By: Davina Poke D.O.   On: 12/27/2022 09:25     Subjective: Patient seen and examined at bedside.  Overnight events noted.   Patient reports doing much better.  Patient is being discharged to home,  home health services arranged.  Discharge Exam: Vitals:   12/30/22 0900 12/30/22 1100  BP: (!) 149/76 (!) 177/79  Pulse: 64 77  Resp: 19 20  Temp:    SpO2: 95% 93%   Vitals:   12/30/22 0600 12/30/22 0700 12/30/22 0900 12/30/22 1100  BP: (!) 156/77 (!) 160/77 (!) 149/76 (!) 177/79  Pulse: 84 64 64 77  Resp:  '15 19 20  '$ Temp:  98.2 F (36.8 C)    TempSrc:  Oral    SpO2:  95% 95% 93%  Weight:      Height:        General: Pt is alert, awake, not in acute distress Cardiovascular: RRR, S1/S2 +, no rubs, no gallops Respiratory: CTA bilaterally, no wheezing, no rhonchi Abdominal: Soft, NT, ND, bowel sounds +, Cholecystostomy tube noted. Extremities: no edema, no cyanosis    The results of significant diagnostics from this hospitalization (including imaging, microbiology, ancillary and laboratory) are listed below for reference.     Microbiology: Recent Results (from the past 240 hour(s))  Blood Culture (routine x 2)     Status: None (Preliminary result)   Collection Time: 12/27/22  9:00 AM   Specimen: BLOOD  Result Value Ref Range Status   Specimen Description BLOOD BLOOD RIGHT ARM  Final   Special Requests   Final    BOTTLES DRAWN AEROBIC AND ANAEROBIC Blood Culture results may not be optimal due to an excessive volume of blood received in culture bottles   Culture   Final    NO GROWTH 3 DAYS Performed at [facility], [address] [place], [place] [address]    Report Status PENDING  Incomplete  Blood Culture (routine x 2)     Status: None (Preliminary result)   Collection Time: 12/27/22  9:00 AM    Specimen: BLOOD  Result Value Ref Range Status   Specimen Description BLOOD BLOOD LEFT ARM  Final   Special Requests   Final    BOTTLES DRAWN AEROBIC AND ANAEROBIC Blood Culture adequate volume   Culture   Final    NO GROWTH 3  DAYS Performed at [facility], [address] [place], [place] [address]    Report Status PENDING  Incomplete  Resp Panel by RT-PCR (Flu A&B, Covid) Anterior Nasal Swab     Status: None   Collection Time: 12/27/22  9:01 AM   Specimen: Anterior Nasal Swab  Result Value Ref Range Status   SARS Coronavirus 2 by RT PCR NEGATIVE NEGATIVE Final    Comment: (NOTE) SARS-CoV-2 target nucleic acids are NOT DETECTED.  The SARS-CoV-2 RNA is generally detectable in upper respiratory specimens during the acute phase of infection. The lowest concentration of SARS-CoV-2 viral copies this assay can detect is 138 copies/mL. A negative result does not preclude SARS-Cov-2 infection and should not be used as the sole basis for treatment or other patient management decisions. A negative result may occur with  improper specimen collection/handling, submission of specimen other than nasopharyngeal swab, presence of viral mutation(s) within the areas targeted by this assay, and inadequate number of viral copies(<138 copies/mL). A negative result must be combined with clinical observations, patient history, and epidemiological information. The expected result is Negative.  Fact Sheet for Patients:  EntrepreneurPulse.com.au  Fact Sheet for Healthcare Providers:  IncredibleEmployment.be  This test is no t yet approved or cleared by the [place] FDA and  has been authorized for detection and/or diagnosis of SARS-CoV-2 by FDA under an Emergency Use Authorization (EUA). This EUA will remain  in effect (meaning this test can be used) for the duration of the COVID-19 declaration under Section 564(b)(1) of the Act, 21 U.S.C.section  360bbb-3(b)(1), unless the authorization is terminated  or revoked sooner.       Influenza A by PCR NEGATIVE NEGATIVE Final   Influenza B by PCR NEGATIVE NEGATIVE Final    Comment: (NOTE) The Xpert Xpress SARS-CoV-2/FLU/RSV plus assay is intended as an aid in the diagnosis of influenza from Nasopharyngeal swab specimens and should not be used as a sole basis for treatment. Nasal washings and aspirates are unacceptable for Xpert Xpress SARS-CoV-2/FLU/RSV testing.  Fact Sheet for Patients: EntrepreneurPulse.com.au  Fact Sheet for Healthcare Providers: IncredibleEmployment.be  This test is not yet approved or cleared by the [place] FDA and has been authorized for detection and/or diagnosis of SARS-CoV-2 by FDA under an Emergency Use Authorization (EUA). This EUA will remain in effect (meaning this test can be used) for the duration of the COVID-19 declaration under Section 564(b)(1) of the Act, 21 U.S.C. section 360bbb-3(b)(1), unless the authorization is terminated or revoked.  Performed at [facility], [address] [place], [place] [address]      Labs: BNP (last 3 results) Recent Labs    12/27/22 0900  BNP 723.5*    Basic Metabolic Panel: Recent Labs  Lab 12/27/22 0900 12/28/22 0610 12/29/22 0701  NA 136 137  --   K 2.9* 3.6  --   CL 105 107  --   CO2 23 23  --   GLUCOSE 124* 107*  --   BUN 16 19  --   CREATININE 0.78 0.89  --   CALCIUM 8.0* 7.6*  --   MG 1.9  --  1.9  PHOS 3.2  --  2.6    Liver Function Tests: Recent Labs  Lab 12/27/22 0900  AST 26  ALT 20  ALKPHOS 46  BILITOT 1.3*  PROT 5.9*  ALBUMIN 3.3*    No results for input(s): "LIPASE", "AMYLASE" in the last 168 hours. No results for input(s): "AMMONIA" in the last 168  hours. CBC: Recent Labs  Lab 12/27/22 0900 12/28/22 0610 12/29/22 0701  WBC 25.0* 18.5* 13.7*  NEUTROABS 22.3*  --   --   HGB 13.8 12.8* 13.6  HCT 41.0 38.2*  40.5  MCV 96.0 96.7 96.0  PLT 192 168 155    Cardiac Enzymes: No results for input(s): "CKTOTAL", "CKMB", "CKMBINDEX", "TROPONINI" in the last 168 hours. BNP: Invalid input(s): "POCBNP" CBG: No results for input(s): "GLUCAP" in the last 168 hours. D-Dimer No results for input(s): "DDIMER" in the last 72 hours. Hgb A1c No results for input(s): "HGBA1C" in the last 72 hours. Lipid Profile No results for input(s): "CHOL", "HDL", "LDLCALC", "TRIG", "CHOLHDL", "LDLDIRECT" in the last 72 hours. Thyroid function studies No results for input(s): "TSH", "T4TOTAL", "T3FREE", "THYROIDAB" in the last 72 hours.  Invalid input(s): "FREET3" Anemia work up No results for input(s): "VITAMINB12", "FOLATE", "FERRITIN", "TIBC", "IRON", "RETICCTPCT" in the last 72 hours. Urinalysis    Component Value Date/Time   COLORURINE YELLOW (A) 12/27/2022 2056   APPEARANCEUR CLEAR (A) 12/27/2022 2056   APPEARANCEUR Clear 03/30/2017 1457   LABSPEC >1.046 (H) 12/27/2022 2056   PHURINE 5.0 12/27/2022 2056   GLUCOSEU NEGATIVE 12/27/2022 2056   GLUCOSEU NEGATIVE 08/22/2018 1450   HGBUR NEGATIVE 12/27/2022 2056   BILIRUBINUR NEGATIVE 12/27/2022 2056   BILIRUBINUR negative 10/13/2022 1639   BILIRUBINUR Negative 03/30/2017 [place] 12/27/2022 2056   PROTEINUR 30 (A) 12/27/2022 2056   UROBILINOGEN 1.0 10/13/2022 1639   UROBILINOGEN 0.2 08/22/2018 1450   NITRITE NEGATIVE 12/27/2022 2056   LEUKOCYTESUR NEGATIVE 12/27/2022 2056   Sepsis Labs Recent Labs  Lab 12/27/22 0900 12/28/22 0610 12/29/22 0701  WBC 25.0* 18.5* 13.7*    Microbiology Recent Results (from the past 240 hour(s))  Blood Culture (routine x 2)     Status: None (Preliminary result)   Collection Time: 12/27/22  9:00 AM   Specimen: BLOOD  Result Value Ref Range Status   Specimen Description BLOOD BLOOD RIGHT ARM  Final   Special Requests   Final    BOTTLES DRAWN AEROBIC AND ANAEROBIC Blood Culture results may not be  optimal due to an excessive volume of blood received in culture bottles   Culture   Final    NO GROWTH 3 DAYS Performed at [facility], [address] [place], [place] [address]    Report Status PENDING  Incomplete  Blood Culture (routine x 2)     Status: None (Preliminary result)   Collection Time: 12/27/22  9:00 AM   Specimen: BLOOD  Result Value Ref Range Status   Specimen Description BLOOD BLOOD LEFT ARM  Final   Special Requests   Final    BOTTLES DRAWN AEROBIC AND ANAEROBIC Blood Culture adequate volume   Culture   Final    NO GROWTH 3 DAYS Performed at [facility], [address] [place], [place] [address]    Report Status PENDING  Incomplete  Resp Panel by RT-PCR (Flu A&B, Covid) Anterior Nasal Swab     Status: None   Collection Time: 12/27/22  9:01 AM   Specimen: Anterior Nasal Swab  Result Value Ref Range Status   SARS Coronavirus 2 by RT PCR NEGATIVE NEGATIVE Final    Comment: (NOTE) SARS-CoV-2 target nucleic acids are NOT DETECTED.  The SARS-CoV-2 RNA is generally detectable in upper respiratory specimens during the acute phase of infection. The lowest concentration of SARS-CoV-2 viral copies this assay can detect is 138 copies/mL. A negative result does not  preclude SARS-Cov-2 infection and should not be used as the sole basis for treatment or other patient management decisions. A negative result may occur with  improper specimen collection/handling, submission of specimen other than nasopharyngeal swab, presence of viral mutation(s) within the areas targeted by this assay, and inadequate number of viral copies(<138 copies/mL). A negative result must be combined with clinical observations, patient history, and epidemiological information. The expected result is Negative.  Fact Sheet for Patients:  EntrepreneurPulse.com.au  Fact Sheet for Healthcare Providers:  IncredibleEmployment.be  This test is no  t yet approved or cleared by the [place] FDA and  has been authorized for detection and/or diagnosis of SARS-CoV-2 by FDA under an Emergency Use Authorization (EUA). This EUA will remain  in effect (meaning this test can be used) for the duration of the COVID-19 declaration under Section 564(b)(1) of the Act, 21 U.S.C.section 360bbb-3(b)(1), unless the authorization is terminated  or revoked sooner.       Influenza A by PCR NEGATIVE NEGATIVE Final   Influenza B by PCR NEGATIVE NEGATIVE Final    Comment: (NOTE) The Xpert Xpress SARS-CoV-2/FLU/RSV plus assay is intended as an aid in the diagnosis of influenza from Nasopharyngeal swab specimens and should not be used as a sole basis for treatment. Nasal washings and aspirates are unacceptable for Xpert Xpress SARS-CoV-2/FLU/RSV testing.  Fact Sheet for Patients: EntrepreneurPulse.com.au  Fact Sheet for Healthcare Providers: IncredibleEmployment.be  This test is not yet approved or cleared by the [place] FDA and has been authorized for detection and/or diagnosis of SARS-CoV-2 by FDA under an Emergency Use Authorization (EUA). This EUA will remain in effect (meaning this test can be used) for the duration of the COVID-19 declaration under Section 564(b)(1) of the Act, 21 U.S.C. section 360bbb-3(b)(1), unless the authorization is terminated or revoked.  Performed at [facility], [address], [place], [place] [address]      Time coordinating discharge: Over 30 minutes  SIGNED:   Vashti Hey, MD  [facility] 12/30/2022, 1:34 PM Pager   If 7PM-7AM, please contact night-coverage

## 2022-12-31 ENCOUNTER — Encounter: Payer: Self-pay | Admitting: Internal Medicine

## 2023-01-01 ENCOUNTER — Telehealth: Payer: Self-pay

## 2023-01-01 ENCOUNTER — Other Ambulatory Visit: Payer: Self-pay | Admitting: Internal Medicine

## 2023-01-01 ENCOUNTER — Telehealth: Payer: Self-pay | Admitting: Internal Medicine

## 2023-01-01 DIAGNOSIS — Z434 Encounter for attention to other artificial openings of digestive tract: Secondary | ICD-10-CM

## 2023-01-01 DIAGNOSIS — K810 Acute cholecystitis: Secondary | ICD-10-CM

## 2023-01-01 LAB — CULTURE, BLOOD (ROUTINE X 2)
Culture: NO GROWTH
Culture: NO GROWTH
Special Requests: ADEQUATE

## 2023-01-01 NOTE — Patient Outreach (Signed)
  Care Coordination [facility] Note Transition Care Management Follow-up Telephone Call Date of discharge and from where: [facility] 12/30/22 How have you been since you were released from the hospital? Per patients daughter he is not doing well at home.  They are having a difficult time managing his needs/ Any questions or concerns? Yes- Family has not heard from [facility] ([place], fax [phone]). This Probation officer called and spoke with Olin Hauser who needed to contact [facility] the hospital rep for the orders.  Patient was recently d/c from there services.  Family is trying to move patient to a SNF and will be going to see PCP to fill out FL2 form, then [facility] SW to start process.  Items Reviewed: Did the pt receive and understand the discharge instructions provided? Yes  Medications obtained and verified? Yes  Other? No  Any new allergies since your discharge? No  Dietary orders reviewed? No Do you have support at home? Yes   Home Care and Equipment/Supplies: Were home health services ordered? yes If so, what is the name of the agency? [facility]  Has the agency set up a time to come to the patient's home? no Were any new equipment or medical supplies ordered?  Yes: Hospital bed What is the name of the medical supply agency? [employer] Were you able to get the supplies/equipment? yes Do you have any questions related to the use of the equipment or supplies? No  Functional Questionnaire: (I = Independent and D = Dependent) ADLs: Needs assistance  Bathing/Dressing- needs assistance  Meal Prep- d  Eating- I  Maintaining continence- needs assistance  Transferring/Ambulation- needs assistance  Managing Meds- needs assistance  Follow up appointments reviewed:  PCP Hospital f/u appt confirmed? Yes  Scheduled to see Dr. Derrel Nip on 01/03/23 @ 11:00. [place] [facility] f/u appt confirmed? Yes  Scheduled to see Dr. Rockey Situ on 2//19/24 @ 4:00. Are transportation arrangements needed? No  If  their condition worsens, is the pt aware to call PCP or go to the Emergency Dept.? Yes Was the patient provided with contact information for the PCP's office or ED? Yes Was to pt encouraged to call back with questions or concerns? Yes  SDOH assessments and interventions completed:   Yes SDOH Interventions Today    Flowsheet Row Most Recent Value  SDOH Interventions   Housing Interventions Intervention Not Indicated  Transportation Interventions Intervention Not Indicated      Interventions Today    Flowsheet Row Most Recent Value  Chronic Disease Discussed/Reviewed   Chronic disease discussed/reviewed during today's visit Other  [Cholelithiasis]  General Adult Interventions   General Interventions Discussed/Reviewed General Interventions Discussed  Pharmacy Interventions   Pharmacy Dicussed/Reviewed Pharmacy Topics Discussed        Care Coordination Interventions:  Referred for Care Coordination Services:  Clinical Social Work   Encounter Outcome:  Pt. Visit Completed

## 2023-01-01 NOTE — Telephone Encounter (Signed)
Pt daughter called in staying that her dad was in the hospital and was discharged Saturday and was told that pt needs to f/u with provider. I scheduled him for 01/31 '@11am'$ .

## 2023-01-01 NOTE — Telephone Encounter (Signed)
Yes will need in person evaluation.

## 2023-01-02 ENCOUNTER — Telehealth: Payer: Self-pay | Admitting: *Deleted

## 2023-01-02 ENCOUNTER — Ambulatory Visit (INDEPENDENT_AMBULATORY_CARE_PROVIDER_SITE_OTHER): Payer: Medicare Other | Admitting: Internal Medicine

## 2023-01-02 ENCOUNTER — Ambulatory Visit (INDEPENDENT_AMBULATORY_CARE_PROVIDER_SITE_OTHER): Payer: Medicare Other

## 2023-01-02 ENCOUNTER — Encounter: Payer: Self-pay | Admitting: Internal Medicine

## 2023-01-02 ENCOUNTER — Other Ambulatory Visit: Payer: Self-pay | Admitting: Internal Medicine

## 2023-01-02 ENCOUNTER — Other Ambulatory Visit
Admission: RE | Admit: 2023-01-02 | Discharge: 2023-01-02 | Disposition: A | Discharge status: Home / Self Care | Payer: Medicare Other | Source: Ambulatory Visit | Attending: Internal Medicine | Admitting: Internal Medicine

## 2023-01-02 VITALS — BP 140/64 | HR 71 | Temp 97.6°F | Ht 70.0 in | Wt 188.4 lb

## 2023-01-02 DIAGNOSIS — R531 Weakness: Secondary | ICD-10-CM | POA: Insufficient documentation

## 2023-01-02 DIAGNOSIS — Z09 Encounter for follow-up examination after completed treatment for conditions other than malignant neoplasm: Secondary | ICD-10-CM | POA: Diagnosis not present

## 2023-01-02 DIAGNOSIS — M47816 Spondylosis without myelopathy or radiculopathy, lumbar region: Secondary | ICD-10-CM | POA: Diagnosis not present

## 2023-01-02 DIAGNOSIS — R197 Diarrhea, unspecified: Secondary | ICD-10-CM | POA: Insufficient documentation

## 2023-01-02 DIAGNOSIS — K810 Acute cholecystitis: Secondary | ICD-10-CM | POA: Diagnosis not present

## 2023-01-02 DIAGNOSIS — A419 Sepsis, unspecified organism: Secondary | ICD-10-CM | POA: Diagnosis not present

## 2023-01-02 DIAGNOSIS — M160 Bilateral primary osteoarthritis of hip: Secondary | ICD-10-CM | POA: Diagnosis not present

## 2023-01-02 LAB — C DIFFICILE QUICK SCREEN W PCR REFLEX
C Diff antigen: NEGATIVE
C Diff interpretation: NOT DETECTED
C Diff toxin: NEGATIVE

## 2023-01-02 LAB — CBC WITH DIFFERENTIAL/PLATELET
Abs Immature Granulocytes: 0.13 10*3/uL — ABNORMAL HIGH (ref 0.00–0.07)
Basophils Absolute: 0 10*3/uL (ref 0.0–0.1)
Basophils Relative: 0 %
Eosinophils Absolute: 0.3 10*3/uL (ref 0.0–0.5)
Eosinophils Relative: 2 %
HCT: 42.7 % (ref 39.0–52.0)
Hemoglobin: 14.2 g/dL (ref 13.0–17.0)
Immature Granulocytes: 1 %
Lymphocytes Relative: 14 %
Lymphs Abs: 1.6 10*3/uL (ref 0.7–4.0)
MCH: 32.1 pg (ref 26.0–34.0)
MCHC: 33.3 g/dL (ref 30.0–36.0)
MCV: 96.4 fL (ref 80.0–100.0)
Monocytes Absolute: 0.9 10*3/uL (ref 0.1–1.0)
Monocytes Relative: 8 %
Neutro Abs: 8.5 10*3/uL — ABNORMAL HIGH (ref 1.7–7.7)
Neutrophils Relative %: 75 %
Platelets: 262 10*3/uL (ref 150–400)
RBC: 4.43 MIL/uL (ref 4.22–5.81)
RDW: 12.7 % (ref 11.5–15.5)
WBC: 11.4 10*3/uL — ABNORMAL HIGH (ref 4.0–10.5)
nRBC: 0 % (ref 0.0–0.2)

## 2023-01-02 LAB — COMPREHENSIVE METABOLIC PANEL
ALT: 27 U/L (ref 0–44)
AST: 28 U/L (ref 15–41)
Albumin: 2.9 g/dL — ABNORMAL LOW (ref 3.5–5.0)
Alkaline Phosphatase: 63 U/L (ref 38–126)
Anion gap: 10 (ref 5–15)
BUN: 12 mg/dL (ref 8–23)
CO2: 25 mmol/L (ref 22–32)
Calcium: 8.3 mg/dL — ABNORMAL LOW (ref 8.9–10.3)
Chloride: 107 mmol/L (ref 98–111)
Creatinine, Ser: 0.77 mg/dL (ref 0.61–1.24)
GFR, Estimated: >60 mL/min (ref >60)
Glucose, Bld: 138 mg/dL — ABNORMAL HIGH (ref 70–99)
Potassium: 3.2 mmol/L — ABNORMAL LOW (ref 3.5–5.1)
Sodium: 142 mmol/L (ref 135–145)
Total Bilirubin: 0.7 mg/dL (ref 0.3–1.2)
Total Protein: 6.1 g/dL — ABNORMAL LOW (ref 6.5–8.1)

## 2023-01-02 LAB — MAGNESIUM: Magnesium: 2.1 mg/dL (ref 1.7–2.4)

## 2023-01-02 MED ORDER — POTASSIUM CHLORIDE CRYS ER 20 MEQ PO TBCR: 20.0000 meq | EXTENDED_RELEASE_TABLET | Freq: Two times a day (BID) | ORAL | 0 refills | Status: DC

## 2023-01-02 NOTE — Assessment & Plan Note (Signed)
S/p placement of cholecystostomy drain and empiric antibiotics .  Surgical plans presumed ; family states they were not advised of the plan

## 2023-01-02 NOTE — Telephone Encounter (Signed)
Spoke with Quinn Plowman and she consulted also with Baxter Flattery our [facility] Social Worker that will be working patient ref 2300 we definitely need the OV and FL2 face to face together sorry.

## 2023-01-02 NOTE — Progress Notes (Signed)
Subjective:  Patient ID: David Atkins, male    DOB: 1928-12-10  Age: 89 y.o. MRN: [id number]  CC: The primary encounter diagnosis was Watery diarrhea. Diagnoses of Acute cholecystitis, Sepsis, due to unspecified organism, unspecified whether acute organ dysfunction present [facility]), Generalized weakness, and Hospital discharge follow-up were also pertinent to this visit.   HPI David Atkins presents for hospital follow up  Admitted to [facility] on Jan 24 with sepsis syndrome secondary to acute cholecystitis, but lactic acid was normal and blood cultures negative.  Placed on supplemental oxygen for hyposemai attributed to CHD (BNP was 700) .  Underwent percutaneous cholcystostomy tube and empiric abx . Discharged home on sat jan 26 with augmentin.  No follow up appt made with surgery, told to schedule appt in 3 to 6 weeks , that the drain with remain in place fo 4 to 6 weks   1) watery stools since discharge on Saturday   jan 27:  5 rounds  of loose stools over the first 24 hour period  . Was not discharged on probiotic ,  but discharged on augmentin.   Sharing bathroom with Earlie Server   .  No diarrhea repoorted form wife thus far.   Appetite good.  No dyspnea   .   2) drain was placed on Wednesday  site "feels funny"  family emptying every 12 hours approximately 200 ml this morning , volume has ranged from 50 to 200 l.  Had blood in draainge for 2 days,  but now looks more bilious   3) does not have surgical follow up  with [facility] (4 week follow up advised)  scheduled yet.  4) unable to be cared for at home  5) hypoxia ;  sent home on 2 L/min.  Family has been monitoring his ambulatory sats which have been  been >92% without oxygen and drop with exertion to 82%  6) some agitation reported by family this morning.  Patient frustrated by his concerns Home health has not been out yet.   7) family (both children) have not been informed of the plan   Expand All Collapse All  Physician Discharge Summary  David Atkins CHE:[id number] DOB: [birth year] DOA: 12/27/2022   PCP: [name], MD   Admit date: 12/27/2022   Discharge date: 12/30/2022   Admitted From: Home.   Disposition:  Home health services.   Recommendations for Outpatient Follow-up:  Follow up with PCP in 1-2 weeks Please obtain BMP/CBC in one week. Advised to continue Augmentin 875 twice daily for 12 days to complete 14-day treatment for acute cholecystitis. Patient is being discharged home with cholecystostomy tube.   Patient will have this tube for 4 to 6 weeks.  Will follow-up with general surgery in 3 to 4 weeks.   Home Health: Home PT/OT Equipment/Devices: Percutaneous cholecystostomy tube   Discharge Condition: Stable CODE STATUS:DNR Diet recommendation: Heart Healthy    Brief Summary/ Hospital Course: This 89 years old male with PMH significant for dCHF, hypertension, hyperlipidemia, COPD, CABG, GERD, BPH, BPPV, Nonepileptic seizure (not taking any medications), presented to ED with a temp of 101 associated shallow breathing,  slightly confused.  CTA negative for PE but showed acute cholecystitis.General surgery consulted,  recommended cholecystostomy tube.IR consulted  and Patient underwent successful cholecystostomy tube.  Antibiotics changed with Augmentin for total 14 days course.  General surgery signed off,  recommended patient can be discharged with cholecystostomy tube,  he will need to have this tube for 4 to 6 weeks. Pain  has resolved.  Home health services arranged.  Patient is being discharged home.   Discharge Diagnoses:  Active Problems:   Acute cholecystitis   Sepsis (HCC)   Chronic diastolic CHF (congestive heart failure) (HCC)   Hypertension   Hyperlipidemia LDL goal <70   CAD (coronary artery disease)   Hypokalemia   BPH (benign prostatic hyperplasia)   OSA on CPAP      Outpatient Medications Prior to Visit  Medication Sig Dispense Refill   acetaminophen (TYLENOL) 500 MG tablet Take 500 mg by  mouth at bedtime.      amLODipine (NORVASC) 2.5 MG tablet TAKE 1 TABLET BY MOUTH  DAILY 100 tablet 2   amoxicillin-clavulanate (AUGMENTIN) 875-125 MG tablet Take 1 tablet by mouth 2 (two) times daily for 12 days. 24 tablet 0   aspirin 81 MG EC tablet Take 81 mg by mouth daily.      atorvastatin (LIPITOR) 20 MG tablet Take 1 tablet (20 mg total) by mouth daily. 90 tablet 3   Blood Pressure Monitoring (BLOOD PRESSURE MONITOR AUTOMAT) DEVI Use daily to check blood pressure 1 Device 0   cholecalciferol (VITAMIN D) 25 MCG (1000 UNIT) tablet Take 1,000 Units by mouth daily.     diclofenac Sodium (VOLTAREN) 1 % GEL APPLY 4 GRAMS TOPICALLY 4  TIMES DAILY 400 g 0   finasteride (PROSCAR) 5 MG tablet TAKE 1 TABLET BY MOUTH  DAILY 100 tablet 2   lisinopril (ZESTRIL) 20 MG tablet TAKE 1 TABLET BY MOUTH  DAILY 100 tablet 2   metoprolol tartrate (LOPRESSOR) 25 MG tablet TAKE ONE-HALF TABLET BY MOUTH  TWICE DAILY 100 tablet 2   nitroGLYCERIN (NITROSTAT) 0.4 MG SL tablet Place 1 tablet (0.4 mg total) under the tongue every 5 (five) minutes as needed for chest pain. Maximum of 3 doses. 25 tablet 3   omeprazole (PRILOSEC) 20 MG capsule TAKE 2 CAPSULES BY MOUTH  DAILY 180 capsule 3   oxybutynin (DITROPAN) 5 MG tablet TAKE 1 TABLET BY MOUTH  TWICE DAILY 200 tablet 2   polyethylene glycol powder (GLYCOLAX/MIRALAX) powder Take 17 g by mouth once. Dissolve 1 capful of power into any liquid and drink once daily (Patient taking differently: Take 17 g by mouth at bedtime. Dissolve 1 capful of power into any liquid and drink once daily) 500 g 0   rOPINIRole (REQUIP) 0.25 MG tablet TAKE 1 TABLET BY MOUTH AT  BEDTIME 100 tablet 2   Simethicone Extra Strength 125 MG CAPS Take 125 mg by mouth as needed (gas).     Syringe/Needle, Disp, (SYRINGE 3CC/25GX1") 25G X 1" 3 ML MISC Use for b12 injections 50 each 0   tamsulosin (FLOMAX) 0.4 MG CAPS capsule TAKE 1 CAPSULE BY MOUTH  DAILY AFTER BREAKFAST 100 capsule 2   No  facility-administered medications prior to visit.    Review of Systems;  Patient denies headache, fevers, malaise, unintentional weight loss, skin rash, eye pain, sinus congestion and sinus pain, sore throat, dysphagia,  hemoptysis , cough, dyspnea, wheezing, chest pain, palpitations, orthopnea, edema, abdominal pain, nausea, melena, diarrhea, constipation, flank pain, dysuria, hematuria, urinary  Frequency, nocturia, numbness, tingling, seizures,  Focal weakness, Loss of consciousness,  Tremor, insomnia, depression, anxiety, and suicidal ideation.      Objective:  BP (!) 140/64   Pulse 71   Temp 97.6 F (36.4 C) (Oral)   Ht '5\' 10"'$  (1.778 m)   Wt 188 lb 6.4 oz (85.5 kg)   SpO2 98%   BMI 27.03 kg/m  BP Readings from Last 3 Encounters:  01/02/23 (!) 140/64  12/30/22 (!) 177/79  12/07/22 (!) 142/74    Wt Readings from Last 3 Encounters:  01/02/23 188 lb 6.4 oz (85.5 kg)  12/27/22 192 lb 7.4 oz (87.3 kg)  12/07/22 192 lb 9.6 oz (87.4 kg)    Physical Exam Vitals reviewed.  Constitutional:      General: He is not in acute distress.    Appearance: Normal appearance. He is normal weight. He is not ill-appearing, toxic-appearing or diaphoretic.  HENT:     Head: Normocephalic.  Eyes:     General: No scleral icterus.       Right eye: No discharge.        Left eye: No discharge.     Conjunctiva/sclera: Conjunctivae normal.  Cardiovascular:     Rate and Rhythm: Normal rate and regular rhythm.     Heart sounds: Normal heart sounds.  Pulmonary:     Effort: Pulmonary effort is normal. No respiratory distress.     Breath sounds: Normal breath sounds.  Abdominal:     Tenderness: There is abdominal tenderness in the right upper quadrant.       Comments: RUQ drain in place,  bandages clean and intact,  drain pouch as 20 ccs bilious fluid  Musculoskeletal:        General: Normal range of motion.     Cervical back: Normal range of motion.  Skin:    General: Skin is warm and dry.   Neurological:     General: No focal deficit present.     Mental Status: He is alert and oriented to person, place, and time. Mental status is at baseline.  Psychiatric:        Mood and Affect: Mood normal.        Behavior: Behavior normal.        Thought Content: Thought content normal.        Judgment: Judgment normal.     Lab Results  Component Value Date   HGBA1C 5.7 11/17/2019   HGBA1C 5.7 02/08/2018   HGBA1C 5.6 11/02/2017    Lab Results  Component Value Date   CREATININE 0.77 01/02/2023   CREATININE 0.89 12/28/2022   CREATININE 0.78 12/27/2022    Lab Results  Component Value Date   WBC 11.4 (H) 01/02/2023   HGB 14.2 01/02/2023   HCT 42.7 01/02/2023   PLT 262 01/02/2023   GLUCOSE 138 (H) 01/02/2023   CHOL 98 12/07/2022   TRIG 92.0 12/07/2022   HDL 39.50 12/07/2022   LDLDIRECT CANCELED 11/02/2017   LDLCALC 40 12/07/2022   ALT 27 01/02/2023   AST 28 01/02/2023   NA 142 01/02/2023   K 3.2 (L) 01/02/2023   CL 107 01/02/2023   CREATININE 0.77 01/02/2023   BUN 12 01/02/2023   CO2 25 01/02/2023   TSH 1.74 10/24/2022   INR 1.2 12/27/2022   HGBA1C 5.7 11/17/2019   MICROALBUR <0.7 02/08/2018    IR Perc Cholecystostomy  Result Date: 12/27/2022 INDICATION: Acute cholecystitis.  Poor operative candidate. EXAM: ULTRASOUND AND FLUOROSCOPIC-GUIDED CHOLECYSTOSTOMY TUBE PLACEMENT COMPARISON:  Chest CT-12/27/2022; right upper quadrant abdominal ultrasound-12/27/2022 MEDICATIONS: The patient is currently admitted to the hospital and on intravenous antibiotics. Antibiotics were administered within an appropriate time frame prior to skin puncture. ANESTHESIA/SEDATION: Moderate (conscious) sedation was employed during this procedure. A total of Versed 1 mg and Fentanyl 50 mcg was administered intravenously. Moderate Sedation Time: 14 minutes. The patient's level of consciousness and vital signs were  monitored continuously by radiology nursing throughout the procedure under my  direct supervision. CONTRAST:  32m OMNIPAQUE IOHEXOL 300 MG/ML SOLN - administered into the gallbladder lumen. FLUOROSCOPY TIME:  36 seconds (8.5 mGy) COMPLICATIONS: None immediate. PROCEDURE: Informed written consent was obtained from the patient after a discussion of the risks, benefits and alternatives to treatment. Questions regarding the procedure were encouraged and answered. A timeout was performed prior to the initiation of the procedure. The right upper abdominal quadrant was prepped and draped in the usual sterile fashion, and a sterile drape was applied covering the operative field. Maximum barrier sterile technique with sterile gowns and gloves were used for the procedure. A timeout was performed prior to the initiation of the procedure. Local anesthesia was provided with 1% lidocaine with epinephrine. Ultrasound scanning of the right upper quadrant demonstrates a markedly dilated gallbladder. Utilizing a transhepatic approach, a 22 gauge needle was advanced into the gallbladder under direct ultrasound guidance. An ultrasound image was saved for documentation purposes. Appropriate intraluminal puncture was confirmed with the efflux of bile and advancement of an 0.018 wire into the gallbladder lumen. The needle was exchanged for an APolkvilleset. A small amount of contrast was injected to confirm appropriate intraluminal positioning. Over a Benson wire, a 11042-French cholecystomy tube was advanced into the gallbladder fossa, coiled and locked. Bile was aspirated and a small amount of contrast was injected as several post procedural spot radiographic images were obtained in various obliquities. The catheter was secured to the skin with suture, connected to a drainage bag and a dressing was placed. The patient tolerated the procedure well without immediate post procedural complication. IMPRESSION: Successful ultrasound and fluoroscopic guided placement of a 10.2 French cholecystostomy tube. Electronically  Signed   By: JSandi MariscalM.D.   On: 12/27/2022 15:46   CT Angio Chest PE W and/or Wo Contrast  Addendum Date: 12/27/2022   ADDENDUM REPORT: 12/27/2022 13:41 ADDENDUM: These results were called by telephone at the time of interpretation on 12/27/2022 at 11:37 a.m. to provider MARK QUALE , who verbally acknowledged these results. Electronically Signed   By: WEmmit AlexandersM.D.   On: 12/27/2022 13:41   Result Date: 12/27/2022 CLINICAL DATA:  Pulmonary embolism suspected, high probability. Right-sided chest pain and elevated white blood cell count. EXAM: CT ANGIOGRAPHY CHEST WITH CONTRAST TECHNIQUE: Multidetector CT imaging of the chest was performed using the standard protocol during bolus administration of intravenous contrast. Multiplanar CT image reconstructions and MIPs were obtained to evaluate the vascular anatomy. RADIATION DOSE REDUCTION: This exam was performed according to the departmental dose-optimization program which includes automated exposure control, adjustment of the mA and/or kV according to patient size and/or use of iterative reconstruction technique. CONTRAST:  729mOMNIPAQUE IOHEXOL 350 MG/ML SOLN COMPARISON:  Chest radiograph 12/27/2022. FINDINGS: Cardiovascular: Satisfactory opacification of the pulmonary arteries to the segmental level. No evidence of pulmonary embolism. Postoperative changes of median sternotomy and CABG. Atherosclerotic calcifications of the thoracic aorta and arch vessel origins. Normal heart size. No pericardial effusion. Mediastinum/Nodes: No mediastinal lymphadenopathy. Trachea is patent. Patulous upper thoracic esophagus. Lungs/Pleura: Low lung volumes with bibasilar and dependent atelectasis. Trace right pleural effusion. Upper Abdomen: Distended gallbladder with 2 radiopaque stones in the neck, wall thickening and pericholecystic fluid. Right-greater-than-left perinephric stranding. Atherosclerotic calcifications of the abdominal aorta and its branches.  Musculoskeletal: No acute rib fracture. Unchanged moderate compression deformity of the T12 vertebral body. Diffusely demineralized appearance of the bones. Review of the MIP images confirms the above findings. IMPRESSION:  1. No evidence of pulmonary embolism. 2. Distended gallbladder with 2 radiopaque stones in the neck, wall thickening and pericholecystic fluid. Findings are concerning for acute cholecystitis. Consider right upper quadrant ultrasound for further evaluation. 3. Low lung volumes with bibasilar and dependent atelectasis. Trace right pleural effusion. 4. Aortic Atherosclerosis (ICD10-I70.0). Electronically Signed: By: Emmit Alexanders M.D. On: 12/27/2022 11:34   US ABDOMEN LIMITED RUQ (LIVER/GB)  Result Date: 12/27/2022 CLINICAL DATA:  Right-sided abdominal pain since yesterday EXAM: ULTRASOUND ABDOMEN LIMITED RIGHT UPPER QUADRANT COMPARISON:  Ultrasound 11/17/2022.  CT 11/08/2022 and older FINDINGS: Gallbladder: Gallbladder is dilated with significant wall thickening measuring up to 8 mm. There also appears to be some wall edema. Gallstones are seen. Common bile duct: Diameter: 5 mm Liver: No focal lesion identified. Within normal limits in parenchymal echogenicity. Portal vein is patent on color Doppler imaging with normal direction of blood flow towards the liver. Other: Portions of the liver obscured by overlapping bowel gas and soft tissue. IMPRESSION: Dilated gallbladder with wall thickening and edema. Gallstones. Findings are worrisome for developing acute cholecystitis. Please correlate with symptoms. If needed for confirmation a HIDA scan can be performed as clinically directed. No ductal dilatation. Electronically Signed   By: Jill Side M.D.   On: 12/27/2022 12:49   DG Chest Port 1 View  Result Date: 12/27/2022 CLINICAL DATA:  Sepsis, lethargy EXAM: PORTABLE CHEST 1 VIEW COMPARISON:  12/15/2018 FINDINGS: Cardiomegaly. Prior sternotomy. Aortic atherosclerosis. Very low lung volumes  with bibasilar opacities. Possible trace right pleural effusion. No pneumothorax. IMPRESSION: Very low lung volumes with bibasilar opacities, atelectasis versus pneumonia. Possible trace right pleural effusion. Electronically Signed   By: Davina Poke D.O.   On: 12/27/2022 09:25    Assessment & Plan:  .Watery diarrhea Assessment & Plan: Patient sent to [facility] with stool collection vials,  c dificile colitis ruled out.   Orders: -     DG Abd 1 View; Future -     Comprehensive metabolic panel; Future -     CBC with Differential/Platelet; Future -     Magnesium; Future -     C Difficile Quick Screen w PCR reflex; Future  Acute cholecystitis Assessment & Plan: S/p placement of cholecystostomy drain and empiric antibiotics .  Surgical plans presumed ; family states they were not advised of the plan    Sepsis, due to unspecified organism, unspecified whether acute organ dysfunction present [facility]) Assessment & Plan: Blood cultures and lactic acid levels were normal    Generalized weakness Assessment & Plan: He is severely debilitated from acute cholecystitis episode and cannot be cared for at home.  Home Health Referral was made at discharge but has not been out to the house yet despite discharge 4 days ago. Urgent CCM referral for SNF placement.       Hospital discharge follow-up Assessment & Plan: Patient is stable post discharge  but profoundly weakneded and cannot be cared for at home .   New onset diarrhea noted:  c dificile colitis ruled out.  General Surgery appt made today for patient. . All labs , imaging studies and progress notes from admission were reviewed with patient today         I provided 30 minutes of face-to-face time during this encounter reviewing patient's last visit with me, patient's  most recent visit with cardiology,  nephrology,  and neurology,  recent surgical and non surgical procedures, previous  labs and imaging studies, counseling on currently  addressed issues,  and post  visit ordering to diagnostics and therapeutics .   Follow-up: No follow-ups on file.   [name], MD

## 2023-01-02 NOTE — Assessment & Plan Note (Addendum)
Blood cultures and lactic acid levels were normal

## 2023-01-02 NOTE — Assessment & Plan Note (Signed)
Patient sent to [facility] with stool collection vials,  c dificile colitis ruled out.

## 2023-01-02 NOTE — Telephone Encounter (Signed)
See DEB's message the FL-2 has to be completed with office visit.

## 2023-01-02 NOTE — Patient Instructions (Addendum)
  Please take a probiotic  START BACK ON YOUR PREVIOUS ONE   GO OVER TO THE HOSPITAL TO GET YOUR LABS AND TO SUBMIT A STOOL SAMPLE TO RULE OUT C DIFICILE  COLITIS  NO MORE MIRALAX UNTIL STOOLS ARE SOLID  I WILL ORDER THE HOME HEALTH RN JUST IN CASE   DOROTHY SHOULD USE A SEPARATE BATHROOM UNTIL WE RULE OUT C DIFICILE BATHROOM NEEDS TO B E CLEANED WITH CHLOROX

## 2023-01-02 NOTE — Progress Notes (Signed)
  Care Coordination   Note   01/02/2023 Name: [name] MRN: [id number] DOB: 1929-06-04  David Atkins is a 89 y.o. year old male who sees Derrel Nip, Aris Everts, MD for primary care. I reached out to [name] by phone today to offer care coordination services.  [name] was given information about Care Coordination services today including:   The Care Coordination services include support from the care team which includes your Nurse Coordinator, Clinical Social Worker, or Pharmacist.  The Care Coordination team is here to help remove barriers to the health concerns and goals most important to you. Care Coordination services are voluntary, and the patient may decline or stop services at any time by request to their care team member.   Care Coordination Consent Status: Patient agreed to services and verbal consent obtained.   Follow up plan:  Telephone appointment with care coordination team member scheduled for:  01/04/2023  Encounter Outcome:  Pt. Scheduled from referral   Julian Hy, [place] Direct Dial: [phone]

## 2023-01-02 NOTE — Assessment & Plan Note (Signed)
Patient is stable post discharge  but profoundly weakneded and cannot be cared for at home .   New onset diarrhea noted:  c dificile colitis ruled out.  General Surgery appt made today for patient. . All labs , imaging studies and progress notes from admission were reviewed with patient today

## 2023-01-02 NOTE — Assessment & Plan Note (Signed)
He is severely debilitated from acute cholecystitis episode and cannot be cared for at home.  Home Health Referral was made at discharge but has not been out to the house yet despite discharge 4 days ago. Urgent CCM referral for SNF placement.

## 2023-01-03 ENCOUNTER — Other Ambulatory Visit: Payer: Self-pay | Admitting: Internal Medicine

## 2023-01-03 ENCOUNTER — Ambulatory Visit: Payer: Medicare Other | Admitting: Internal Medicine

## 2023-01-03 DIAGNOSIS — K810 Acute cholecystitis: Secondary | ICD-10-CM | POA: Diagnosis not present

## 2023-01-03 DIAGNOSIS — E785 Hyperlipidemia, unspecified: Secondary | ICD-10-CM | POA: Diagnosis not present

## 2023-01-03 DIAGNOSIS — K5900 Constipation, unspecified: Secondary | ICD-10-CM | POA: Diagnosis not present

## 2023-01-03 DIAGNOSIS — R197 Diarrhea, unspecified: Secondary | ICD-10-CM | POA: Diagnosis not present

## 2023-01-03 DIAGNOSIS — I6789 Other cerebrovascular disease: Secondary | ICD-10-CM | POA: Diagnosis not present

## 2023-01-03 DIAGNOSIS — R609 Edema, unspecified: Secondary | ICD-10-CM | POA: Diagnosis not present

## 2023-01-03 DIAGNOSIS — N3281 Overactive bladder: Secondary | ICD-10-CM | POA: Diagnosis not present

## 2023-01-03 DIAGNOSIS — R42 Dizziness and giddiness: Secondary | ICD-10-CM | POA: Diagnosis not present

## 2023-01-03 DIAGNOSIS — I6523 Occlusion and stenosis of bilateral carotid arteries: Secondary | ICD-10-CM | POA: Diagnosis not present

## 2023-01-03 DIAGNOSIS — K8020 Calculus of gallbladder without cholecystitis without obstruction: Secondary | ICD-10-CM | POA: Diagnosis not present

## 2023-01-03 DIAGNOSIS — G8929 Other chronic pain: Secondary | ICD-10-CM | POA: Diagnosis not present

## 2023-01-03 DIAGNOSIS — I5032 Chronic diastolic (congestive) heart failure: Secondary | ICD-10-CM | POA: Diagnosis not present

## 2023-01-03 DIAGNOSIS — R1031 Right lower quadrant pain: Secondary | ICD-10-CM | POA: Diagnosis not present

## 2023-01-03 DIAGNOSIS — Z4803 Encounter for change or removal of drains: Secondary | ICD-10-CM | POA: Diagnosis not present

## 2023-01-03 DIAGNOSIS — G4733 Obstructive sleep apnea (adult) (pediatric): Secondary | ICD-10-CM | POA: Diagnosis not present

## 2023-01-03 DIAGNOSIS — M40202 Unspecified kyphosis, cervical region: Secondary | ICD-10-CM | POA: Diagnosis not present

## 2023-01-03 DIAGNOSIS — J449 Chronic obstructive pulmonary disease, unspecified: Secondary | ICD-10-CM | POA: Diagnosis not present

## 2023-01-03 DIAGNOSIS — R2681 Unsteadiness on feet: Secondary | ICD-10-CM | POA: Diagnosis not present

## 2023-01-03 DIAGNOSIS — A419 Sepsis, unspecified organism: Secondary | ICD-10-CM | POA: Diagnosis not present

## 2023-01-03 DIAGNOSIS — I739 Peripheral vascular disease, unspecified: Secondary | ICD-10-CM | POA: Diagnosis not present

## 2023-01-03 DIAGNOSIS — I110 Hypertensive heart disease with heart failure: Secondary | ICD-10-CM | POA: Diagnosis not present

## 2023-01-03 NOTE — Patient Outreach (Signed)
  Care Coordination   Initial Visit Note   01/03/2023 Name: David Atkins MRN: [id number] DOB: December 09, 1928  David Atkins is a 89 y.o. year old male who sees Derrel Nip, Aris Everts, MD for primary care. I spoke with  Sharvil Quizon's daughter and son by phone on 01/02/23  What matters to the patients health and wellness today?  Skilled Care    Goals Addressed             This Visit's Progress    "we would like help with rehab placement"       Care Coordination Interventions: Phone call to patient's daughter and son to discuss patient's need for rehab placement Patient  discharged from the hospital on 12/30/22 with orders for [facility] PT, OT however once home family felt that he would benefit from skilled  level of care  SNF process explained to family including need for provider recommendation(Fl2) and clinical need for clinical documentation for insurance authorization. [facility] PT will visit on 01/03/23 to  complete evaluation-recommendations to be reviewed once complete Private duty care discussed as a consideration in the event that insurance authorization unable to be obtained Phone call to [place] with Aaron Edelman [phone] to confirm that PT would be coming to evaluate patient on 01/03/23-CSW to  await recommendations          SDOH assessments and interventions completed:  Yes  SDOH Interventions Today    Flowsheet Row Most Recent Value  SDOH Interventions   Food Insecurity Interventions Intervention Not Indicated  Housing Interventions Intervention Not Indicated  Transportation Interventions Intervention Not Indicated        Care Coordination Interventions:  Yes, provided   Follow up plan: Follow up call scheduled for 01/04/23    Encounter Outcome:  Pt. Visit Completed

## 2023-01-03 NOTE — Patient Instructions (Signed)
Visit Information  Thank you for taking time to visit with me today. Please don't hesitate to contact me if I can be of assistance to you.   Following are the goals we discussed today:   Goals Addressed             This Visit's Progress    "we would like help with rehab placement"       Care Coordination Interventions: Phone call to patient's daughter and son to discuss patient's need for rehab placement Patient  discharged from the hospital on 12/30/22 with orders for [facility] PT, OT however once home family felt that he would benefit from skilled  level of care  SNF process explained to family including need for provider recommendation(Fl2) and clinical need for clinical documentation for insurance authorization. [facility] PT will visit on 01/03/23 to  complete evaluation-recommendations to be reviewed once complete Private duty care discussed as a consideration in the event that insurance authorization unable to be obtained Phone call to [place] with Aaron Edelman [phone] to confirm that PT would be coming to evaluate patient on 01/03/23-CSW to  await recommendations          Our next appointment is by telephone on 01/04/23 at 2pm  Please call the care guide team at [phone] if you need to cancel or reschedule your appointment.   If you are experiencing a Mental Health or [place] or need someone to talk to, please call 911   Patient verbalizes understanding of instructions and care plan provided today and agrees to view in [place]. Active MyChart status and patient understanding of how to access instructions and care plan via MyChart confirmed with patient.     Telephone follow up appointment with care management team member scheduled for: 01/04/23  Elliot Gurney, [place] Worker  [facility] Care Management [phone]

## 2023-01-04 ENCOUNTER — Ambulatory Visit: Payer: Self-pay | Admitting: *Deleted

## 2023-01-04 DIAGNOSIS — A419 Sepsis, unspecified organism: Secondary | ICD-10-CM | POA: Diagnosis not present

## 2023-01-04 DIAGNOSIS — J449 Chronic obstructive pulmonary disease, unspecified: Secondary | ICD-10-CM | POA: Diagnosis not present

## 2023-01-04 DIAGNOSIS — E785 Hyperlipidemia, unspecified: Secondary | ICD-10-CM | POA: Diagnosis not present

## 2023-01-04 DIAGNOSIS — I110 Hypertensive heart disease with heart failure: Secondary | ICD-10-CM | POA: Diagnosis not present

## 2023-01-04 DIAGNOSIS — G4733 Obstructive sleep apnea (adult) (pediatric): Secondary | ICD-10-CM | POA: Diagnosis not present

## 2023-01-04 DIAGNOSIS — I6789 Other cerebrovascular disease: Secondary | ICD-10-CM | POA: Diagnosis not present

## 2023-01-04 DIAGNOSIS — K810 Acute cholecystitis: Secondary | ICD-10-CM | POA: Diagnosis not present

## 2023-01-04 DIAGNOSIS — R609 Edema, unspecified: Secondary | ICD-10-CM | POA: Diagnosis not present

## 2023-01-04 DIAGNOSIS — R1031 Right lower quadrant pain: Secondary | ICD-10-CM | POA: Diagnosis not present

## 2023-01-04 DIAGNOSIS — Z4803 Encounter for change or removal of drains: Secondary | ICD-10-CM | POA: Diagnosis not present

## 2023-01-04 DIAGNOSIS — R2681 Unsteadiness on feet: Secondary | ICD-10-CM | POA: Diagnosis not present

## 2023-01-04 DIAGNOSIS — I739 Peripheral vascular disease, unspecified: Secondary | ICD-10-CM | POA: Diagnosis not present

## 2023-01-04 DIAGNOSIS — K8020 Calculus of gallbladder without cholecystitis without obstruction: Secondary | ICD-10-CM | POA: Diagnosis not present

## 2023-01-04 DIAGNOSIS — I6523 Occlusion and stenosis of bilateral carotid arteries: Secondary | ICD-10-CM | POA: Diagnosis not present

## 2023-01-04 DIAGNOSIS — R42 Dizziness and giddiness: Secondary | ICD-10-CM | POA: Diagnosis not present

## 2023-01-04 DIAGNOSIS — K5900 Constipation, unspecified: Secondary | ICD-10-CM | POA: Diagnosis not present

## 2023-01-04 DIAGNOSIS — M40202 Unspecified kyphosis, cervical region: Secondary | ICD-10-CM | POA: Diagnosis not present

## 2023-01-04 DIAGNOSIS — N3281 Overactive bladder: Secondary | ICD-10-CM | POA: Diagnosis not present

## 2023-01-04 DIAGNOSIS — R197 Diarrhea, unspecified: Secondary | ICD-10-CM | POA: Diagnosis not present

## 2023-01-04 DIAGNOSIS — G8929 Other chronic pain: Secondary | ICD-10-CM | POA: Diagnosis not present

## 2023-01-04 DIAGNOSIS — I5032 Chronic diastolic (congestive) heart failure: Secondary | ICD-10-CM | POA: Diagnosis not present

## 2023-01-04 NOTE — Patient Outreach (Signed)
  Care Coordination   Follow Up Visit Note   01/04/2023 Name: David Atkins MRN: [id number] DOB: 12-Feb-1929  David Atkins is a 89 y.o. year old male who sees David Atkins, David Everts, MD for primary care. I spoke with  David Atkins 's daughter David Atkins by phone today.  What matters to the patients health and wellness today?  SNF placement    Goals Addressed             This Visit's Progress    "we would like help with rehab placement"       Care Coordination Interventions: Phone call to patient's daughter and son to discuss status of  rehab placement and documentation needed Patient's daughter confirmed that PT from [facility] evaluated patient yesterday recommending SNF placement Phone call to PT-left VM with David Atkins requesting that the evaluation be provided for placement authorization SNF process continues to be discussed-FL2 and clinicals will need to be obtained in order to start bed search Fl2 completed on EPIC awaiting co-signature          SDOH assessments and interventions completed:  No     Care Coordination Interventions:  Yes, provided   Follow up plan: Follow up call scheduled for 01/08/23    Encounter Outcome:  Pt. Visit Completed

## 2023-01-04 NOTE — Patient Instructions (Addendum)
Visit Information  Thank you for taking time to visit with me today. Please don't hesitate to contact me if I can be of assistance to you.   Following are the goals we discussed today:   Goals Addressed             This Visit's Progress    "we would like help with rehab placement"       Care Coordination Interventions: Phone call to patient's daughter and son to discuss status of  rehab placement and documentation needed Patient's daughter confirmed that PT from [facility] evaluated patient yesterday recommending SNF placement Phone call to PT-left VM with Hassan Rowan requesting that the evaluation be provided for placement authorization SNF process continues to be discussed-FL2 and clinicals will need to be obtained in order to start bed search Fl2 completed on EPIC awaiting co-signature          Our next appointment is by telephone on 01/08/23 at 3pm  Please call the care guide team at [phone] if you need to cancel or reschedule your appointment.   If you are experiencing a Mental Health or [place] or need someone to talk to, please call 911   Patient verbalizes understanding of instructions and care plan provided today and agrees to view in [place]. Active MyChart status and patient understanding of how to access instructions and care plan via MyChart confirmed with patient.     Telephone follow up appointment with care management team member scheduled for: 01/08/23  Elliot Gurney, [place] Worker  [facility] Care Management [phone]

## 2023-01-04 NOTE — NC FL2 (Deleted)
[place] LEVEL OF CARE FORM     IDENTIFICATION  Patient Name: David Atkins Birthdate: 1929-10-23 Sex: male Admission Date (Current Location): (Not on file)  [place] and [place] Number:      Facility and Address:         Provider Number:    Attending Physician Name and Address:  [name], MD  Relative Name and Phone Number:       Current Level of Care:   Recommended Level of Care:   Prior Approval Number:    Date Approved/Denied:   PASRR Number:    Discharge Plan:      Current Diagnoses: Patient Active Problem List   Diagnosis Date Noted   Watery diarrhea 01/02/2023   Generalized weakness 01/02/2023   CAP (community acquired pneumonia) 12/27/2022   Sepsis ([place]) 12/27/2022   CAD (coronary artery disease) 12/27/2022   Chronic diastolic CHF (congestive heart failure) ([place]) 12/27/2022   Hypokalemia 12/27/2022   Acute cholecystitis 12/27/2022   Atherosclerosis of aortic bifurcation and common iliac arteries ([place]) 12/09/2022   Cholelithiasis 12/07/2022   Right lower quadrant abdominal pain 10/24/2022   Full incontinence of feces 10/24/2022   Laceration of left hand without foreign body 09/27/2022   OAB (overactive bladder)    Allergic rhinitis 03/29/2020   Allergic conjunctivitis 03/29/2020   Pharyngoesophageal dysphagia 07/07/2019   Hospital discharge follow-up 12/29/2018   Seizure ([place]) 12/16/2018   PAD (peripheral artery disease) ([place]) 10/01/2018   Cerebrovascular disease 02/07/2018   CAD (coronary artery disease), native coronary artery 11/04/2017   Vertigo 10/11/2017   Right foot pain 08/31/2017   Edema of right lower extremity 07/18/2017   RLS (restless legs syndrome) 07/18/2017   Hx of CABG 09/21/2016   Poor balance 12/04/2015   IBS (irritable bowel syndrome) 11/03/2015   Generalized anxiety disorder 01/20/2015   Overweight 05/24/2014   Unspecified vitamin D deficiency 05/22/2014   OSA on CPAP 01/19/2014   Lumbar pain with  radiation down left leg 12/01/2013   Encounter for preventive health examination 01/13/2013   GERD (gastroesophageal reflux disease) 01/06/2013   Bilateral carotid artery stenosis 12/07/2012   Hyperlipidemia LDL goal <70 07/09/2012   Impaired glucose tolerance 07/09/2012   BPPV (benign paroxysmal positional vertigo) 07/09/2012   Osteoporosis    BPH (benign prostatic hyperplasia)    Hypertension     Orientation RESPIRATION BLADDER Height & Weight            Weight:   Height:     BEHAVIORAL SYMPTOMS/MOOD NEUROLOGICAL BOWEL NUTRITION STATUS           AMBULATORY STATUS COMMUNICATION OF NEEDS Skin                               Personal Care Assistance Level of Assistance              Functional Limitations Info             SPECIAL CARE FACTORS FREQUENCY                       Contractures      Additional Factors Info                  Current Medications (01/04/2023):  This is the current hospital active medication list Current Outpatient Medications  Medication Sig Dispense Refill   acetaminophen (TYLENOL) 500 MG tablet Take 500 mg by mouth at  bedtime.      amLODipine (NORVASC) 2.5 MG tablet TAKE 1 TABLET BY MOUTH DAILY 100 tablet 2   amoxicillin-clavulanate (AUGMENTIN) 875-125 MG tablet Take 1 tablet by mouth 2 (two) times daily for 12 days. 24 tablet 0   aspirin 81 MG EC tablet Take 81 mg by mouth daily.      atorvastatin (LIPITOR) 20 MG tablet Take 1 tablet (20 mg total) by mouth daily. 90 tablet 3   Blood Pressure Monitoring (BLOOD PRESSURE MONITOR AUTOMAT) DEVI Use daily to check blood pressure 1 Device 0   cholecalciferol (VITAMIN D) 25 MCG (1000 UNIT) tablet Take 1,000 Units by mouth daily.     diclofenac Sodium (VOLTAREN) 1 % GEL APPLY 4 GRAMS TOPICALLY 4  TIMES DAILY 400 g 0   finasteride (PROSCAR) 5 MG tablet TAKE 1 TABLET BY MOUTH  DAILY 100 tablet 2   lisinopril (ZESTRIL) 20 MG tablet TAKE 1 TABLET BY MOUTH DAILY 100 tablet 2    metoprolol tartrate (LOPRESSOR) 25 MG tablet TAKE ONE-HALF TABLET BY MOUTH  TWICE DAILY 100 tablet 2   nitroGLYCERIN (NITROSTAT) 0.4 MG SL tablet Place 1 tablet (0.4 mg total) under the tongue every 5 (five) minutes as needed for chest pain. Maximum of 3 doses. 25 tablet 3   omeprazole (PRILOSEC) 20 MG capsule TAKE 2 CAPSULES BY MOUTH  DAILY 180 capsule 3   oxybutynin (DITROPAN) 5 MG tablet TAKE 1 TABLET BY MOUTH  TWICE DAILY 200 tablet 2   polyethylene glycol powder (GLYCOLAX/MIRALAX) powder Take 17 g by mouth once. Dissolve 1 capful of power into any liquid and drink once daily (Patient taking differently: Take 17 g by mouth at bedtime. Dissolve 1 capful of power into any liquid and drink once daily) 500 g 0   potassium chloride SA (KLOR-CON M) 20 MEQ tablet Take 1 tablet (20 mEq total) by mouth 2 (two) times daily. With food 4 tablet 0   rOPINIRole (REQUIP) 0.25 MG tablet TAKE 1 TABLET BY MOUTH AT  BEDTIME 100 tablet 2   Simethicone Extra Strength 125 MG CAPS Take 125 mg by mouth as needed (gas).     Syringe/Needle, Disp, (SYRINGE 3CC/25GX1") 25G X 1" 3 ML MISC Use for b12 injections 50 each 0   tamsulosin (FLOMAX) 0.4 MG CAPS capsule TAKE 1 CAPSULE BY MOUTH  DAILY AFTER BREAKFAST 100 capsule 2   No current facility-administered medications for this visit.     Discharge Medications: Please see discharge summary for a list of discharge medications.  Relevant Imaging Results:  Relevant Lab Results:   Additional Information    Sion Thane, [place], [place]

## 2023-01-04 NOTE — NC FL2 (Cosign Needed)
[place] LEVEL OF CARE FORM     IDENTIFICATION  Patient Name: David Atkins Birthdate: 1929/07/01 Sex: male Admission Date (Current Location): (Not on file)  [place] and [place] Number:  Engineering geologist and Address:        Provider Number: [id number]  Attending Physician Name and Address:  [name], MD  Relative Name and Phone Number:  Rip Harbour [phone]    Current Level of Care: Home Recommended Level of Care: [place] Prior Approval Number:    Date Approved/Denied:   PASRR Number: [id number]  Discharge Plan:      Current Diagnoses: Patient Active Problem List   Diagnosis Date Noted   Watery diarrhea 01/02/2023   Generalized weakness 01/02/2023   CAP (community acquired pneumonia) 12/27/2022   Sepsis ([place]) 12/27/2022   CAD (coronary artery disease) 12/27/2022   Chronic diastolic CHF (congestive heart failure) ([place]) 12/27/2022   Hypokalemia 12/27/2022   Acute cholecystitis 12/27/2022   Atherosclerosis of aortic bifurcation and common iliac arteries ([place]) 12/09/2022   Cholelithiasis 12/07/2022   Right lower quadrant abdominal pain 10/24/2022   Full incontinence of feces 10/24/2022   Laceration of left hand without foreign body 09/27/2022   OAB (overactive bladder)    Allergic rhinitis 03/29/2020   Allergic conjunctivitis 03/29/2020   Pharyngoesophageal dysphagia 07/07/2019   Hospital discharge follow-up 12/29/2018   Seizure ([place]) 12/16/2018   PAD (peripheral artery disease) ([place]) 10/01/2018   Cerebrovascular disease 02/07/2018   CAD (coronary artery disease), native coronary artery 11/04/2017   Vertigo 10/11/2017   Right foot pain 08/31/2017   Edema of right lower extremity 07/18/2017   RLS (restless legs syndrome) 07/18/2017   Hx of CABG 09/21/2016   Poor balance 12/04/2015   IBS (irritable bowel syndrome) 11/03/2015   Generalized anxiety disorder 01/20/2015   Overweight 05/24/2014   Unspecified  vitamin D deficiency 05/22/2014   OSA on CPAP 01/19/2014   Lumbar pain with radiation down left leg 12/01/2013   Encounter for preventive health examination 01/13/2013   GERD (gastroesophageal reflux disease) 01/06/2013   Bilateral carotid artery stenosis 12/07/2012   Hyperlipidemia LDL goal <70 07/09/2012   Impaired glucose tolerance 07/09/2012   BPPV (benign paroxysmal positional vertigo) 07/09/2012   Osteoporosis    BPH (benign prostatic hyperplasia)    Hypertension     Orientation RESPIRATION BLADDER Height & Weight     Self, Time, Place, Situation  Normal Continent Weight:   Height:     BEHAVIORAL SYMPTOMS/MOOD NEUROLOGICAL BOWEL NUTRITION STATUS      Continent Diet  AMBULATORY STATUS COMMUNICATION OF NEEDS Skin   Limited Assist Verbally Normal                       Personal Care Assistance Level of Assistance  Feeding, Dressing, Bathing Bathing Assistance: Maximum assistance Feeding assistance: Independent Dressing Assistance: Limited assistance     Functional Limitations Info  Sight, Hearing, Speech Sight Info: Adequate (wears glasses) Hearing Info: Adequate Speech Info: Adequate    SPECIAL CARE FACTORS FREQUENCY  PT (By licensed PT), OT (By licensed OT)     PT Frequency: 5x per week OT Frequency: 5x per week            Contractures Contractures Info: Not present    Additional Factors Info  Code Status, Allergies Code Status Info: DNR Allergies Info: Sulfa antibiotics rash           Current Medications (01/04/2023):  This is the current hospital  active medication list Current Outpatient Medications  Medication Sig Dispense Refill   acetaminophen (TYLENOL) 500 MG tablet Take 500 mg by mouth at bedtime.      amLODipine (NORVASC) 2.5 MG tablet TAKE 1 TABLET BY MOUTH DAILY 100 tablet 2   amoxicillin-clavulanate (AUGMENTIN) 875-125 MG tablet Take 1 tablet by mouth 2 (two) times daily for 12 days. 24 tablet 0   aspirin 81 MG EC tablet Take 81 mg  by mouth daily.      atorvastatin (LIPITOR) 20 MG tablet Take 1 tablet (20 mg total) by mouth daily. 90 tablet 3   Blood Pressure Monitoring (BLOOD PRESSURE MONITOR AUTOMAT) DEVI Use daily to check blood pressure 1 Device 0   cholecalciferol (VITAMIN D) 25 MCG (1000 UNIT) tablet Take 1,000 Units by mouth daily.     diclofenac Sodium (VOLTAREN) 1 % GEL APPLY 4 GRAMS TOPICALLY 4  TIMES DAILY 400 g 0   finasteride (PROSCAR) 5 MG tablet TAKE 1 TABLET BY MOUTH  DAILY 100 tablet 2   lisinopril (ZESTRIL) 20 MG tablet TAKE 1 TABLET BY MOUTH DAILY 100 tablet 2   metoprolol tartrate (LOPRESSOR) 25 MG tablet TAKE ONE-HALF TABLET BY MOUTH  TWICE DAILY 100 tablet 2   nitroGLYCERIN (NITROSTAT) 0.4 MG SL tablet Place 1 tablet (0.4 mg total) under the tongue every 5 (five) minutes as needed for chest pain. Maximum of 3 doses. 25 tablet 3   omeprazole (PRILOSEC) 20 MG capsule TAKE 2 CAPSULES BY MOUTH  DAILY 180 capsule 3   oxybutynin (DITROPAN) 5 MG tablet TAKE 1 TABLET BY MOUTH  TWICE DAILY 200 tablet 2   polyethylene glycol powder (GLYCOLAX/MIRALAX) powder Take 17 g by mouth once. Dissolve 1 capful of power into any liquid and drink once daily (Patient taking differently: Take 17 g by mouth at bedtime. Dissolve 1 capful of power into any liquid and drink once daily) 500 g 0   potassium chloride SA (KLOR-CON M) 20 MEQ tablet Take 1 tablet (20 mEq total) by mouth 2 (two) times daily. With food 4 tablet 0   rOPINIRole (REQUIP) 0.25 MG tablet TAKE 1 TABLET BY MOUTH AT  BEDTIME 100 tablet 2   Simethicone Extra Strength 125 MG CAPS Take 125 mg by mouth as needed (gas).     Syringe/Needle, Disp, (SYRINGE 3CC/25GX1") 25G X 1" 3 ML MISC Use for b12 injections 50 each 0   tamsulosin (FLOMAX) 0.4 MG CAPS capsule TAKE 1 CAPSULE BY MOUTH  DAILY AFTER BREAKFAST 100 capsule 2   No current facility-administered medications for this visit.     Discharge Medications: Please see discharge summary for a list of discharge  medications.  Relevant Imaging Results:  Relevant Lab Results:   Additional Information SS# [id number]  Elliot Gurney [place], [place]

## 2023-01-08 ENCOUNTER — Ambulatory Visit: Payer: Self-pay | Admitting: *Deleted

## 2023-01-08 DIAGNOSIS — E785 Hyperlipidemia, unspecified: Secondary | ICD-10-CM | POA: Diagnosis not present

## 2023-01-08 DIAGNOSIS — I6789 Other cerebrovascular disease: Secondary | ICD-10-CM | POA: Diagnosis not present

## 2023-01-08 DIAGNOSIS — I739 Peripheral vascular disease, unspecified: Secondary | ICD-10-CM | POA: Diagnosis not present

## 2023-01-08 DIAGNOSIS — G4733 Obstructive sleep apnea (adult) (pediatric): Secondary | ICD-10-CM | POA: Diagnosis not present

## 2023-01-08 DIAGNOSIS — K810 Acute cholecystitis: Secondary | ICD-10-CM | POA: Diagnosis not present

## 2023-01-08 DIAGNOSIS — A419 Sepsis, unspecified organism: Secondary | ICD-10-CM | POA: Diagnosis not present

## 2023-01-08 DIAGNOSIS — K5900 Constipation, unspecified: Secondary | ICD-10-CM | POA: Diagnosis not present

## 2023-01-08 DIAGNOSIS — N3281 Overactive bladder: Secondary | ICD-10-CM | POA: Diagnosis not present

## 2023-01-08 DIAGNOSIS — M40202 Unspecified kyphosis, cervical region: Secondary | ICD-10-CM | POA: Diagnosis not present

## 2023-01-08 DIAGNOSIS — R42 Dizziness and giddiness: Secondary | ICD-10-CM | POA: Diagnosis not present

## 2023-01-08 DIAGNOSIS — R197 Diarrhea, unspecified: Secondary | ICD-10-CM | POA: Diagnosis not present

## 2023-01-08 DIAGNOSIS — R2681 Unsteadiness on feet: Secondary | ICD-10-CM | POA: Diagnosis not present

## 2023-01-08 DIAGNOSIS — I6523 Occlusion and stenosis of bilateral carotid arteries: Secondary | ICD-10-CM | POA: Diagnosis not present

## 2023-01-08 DIAGNOSIS — R609 Edema, unspecified: Secondary | ICD-10-CM | POA: Diagnosis not present

## 2023-01-08 DIAGNOSIS — I110 Hypertensive heart disease with heart failure: Secondary | ICD-10-CM | POA: Diagnosis not present

## 2023-01-08 DIAGNOSIS — G8929 Other chronic pain: Secondary | ICD-10-CM | POA: Diagnosis not present

## 2023-01-08 DIAGNOSIS — R1031 Right lower quadrant pain: Secondary | ICD-10-CM | POA: Diagnosis not present

## 2023-01-08 DIAGNOSIS — Z4803 Encounter for change or removal of drains: Secondary | ICD-10-CM | POA: Diagnosis not present

## 2023-01-08 DIAGNOSIS — I5032 Chronic diastolic (congestive) heart failure: Secondary | ICD-10-CM | POA: Diagnosis not present

## 2023-01-08 DIAGNOSIS — J449 Chronic obstructive pulmonary disease, unspecified: Secondary | ICD-10-CM | POA: Diagnosis not present

## 2023-01-08 DIAGNOSIS — K8020 Calculus of gallbladder without cholecystitis without obstruction: Secondary | ICD-10-CM | POA: Diagnosis not present

## 2023-01-08 NOTE — Patient Instructions (Addendum)
Visit Information  Thank you for taking time to visit with me today. Please don't hesitate to contact me if I can be of assistance to you.   Following are the goals we discussed today:   Goals Addressed             This Visit's Progress    "we would like help with rehab placement"       Care Coordination Interventions: Phone call to patient's daughter to discuss status of  rehab placement and documentation needed Patient's daughter confirmed that due to patient's overall improvement, she had decided to keep patient in the home Patient's daughter confirms that patient continues to receive [facility] services through [facility] PT , OT and Nursing-OT visited today-RN coming to visit on Wednesday Referral also made for [facility] RNCM-initial appointment scheduled for 01/09/23 RNCM notified of referral Patient's provider informed of plan for patient to remain home with [facility] services Patient's daughter requesting information regarding [facility]-contact information provided for the [facility] [phone] Patient's daughter verbalized having no additional community resource needs at this time          Please call the care guide team at [phone] if you need to cancel or reschedule your appointment.   If you are experiencing a Mental Health or [place] or need someone to talk to, please call 911   Patient verbalizes understanding of instructions and care plan provided today and agrees to view in [place]. Active MyChart status and patient understanding of how to access instructions and care plan via MyChart confirmed with patient.     Telephone follow up appointment with care management team member scheduled for: 01/09/23 with [place], [place] Worker  [facility] Care Management [phone]

## 2023-01-08 NOTE — Patient Outreach (Addendum)
  Care Coordination   Follow Up Visit Note   01/08/2023 Name: David Atkins MRN: [id number] DOB: [birth year]  David Atkins is a 89 y.o. year old male who sees Derrel Nip, Aris Everts, MD for primary care. I spoke with  [name] by phone today.  What matters to the patients health and wellness today?  Patient's daughter has now decided for patient to remain home with home health services    Goals Addressed             This Visit's Progress    "we would like help with rehab placement"       Care Coordination Interventions: Phone call to patient's daughter to discuss status of  rehab placement and documentation needed Patient's daughter confirmed that due to patient's overall improvement, she had decided to keep patient in the home Patient's daughter confirms that patient continues to receive [facility] services through [facility] PT , OT and Nursing-OT visited today-RN coming to visit on Wednesday Referral also made for [facility] RNCM-initial appointment scheduled for 01/09/23 RNCM notified of referral Patient's provider informed of plan for patient to remain home with [facility] services Patient's daughter requesting information regarding in home supportive services through the [facility]-contact information provided for the [facility] [phone] Patient's daughter verbalized having no additional community resource needs at this time         SDOH assessments and interventions completed:  No     Care Coordination Interventions:  Yes, provided   Interventions Today    Flowsheet Row Most Recent Value  General Interventions   General Interventions Discussed/Reviewed General Interventions Discussed  Education Interventions   Education Provided Provided Verbal Education  Provided Verbal Education On --  [provided resources for in home care through the [facility]]       Follow up plan: No further intervention required.   Encounter Outcome:  Pt. Visit Completed

## 2023-01-09 ENCOUNTER — Ambulatory Visit: Payer: Self-pay

## 2023-01-09 NOTE — Patient Instructions (Signed)
Visit Information  Thank you for taking time to visit with me today. Please don't hesitate to contact me if I can be of assistance to you.   Following are the goals we discussed today:   Goals Addressed             This Visit's Progress    Interventions Today    Flowsheet Row Most Recent Value  Chronic Disease Discussed/Reviewed   Chronic disease discussed/reviewed during today's visit Other, Hypertension (HTN), Chronic Obstructive Pulmonary Disease (COPD)  [cholecystitis]  General Interventions   General Interventions Discussed/Reviewed General Interventions Discussed  Education Interventions   Education Provided Provided Verbal Education  Provided Verbal Education On When to see the doctor      Care Coordination Interventions: Evaluation of current treatment plan related to cholecystitis and patient's adherence to plan as established by provider Reviewed medications with patient and discussed compliance Reviewed scheduled/upcoming provider appointments:     Discussed plans with patient for ongoing care management follow up and provided patient with direct contact information for care management team:   Discussed signs/ symptoms of infection and advised to contact general surgeon if this is noted.  Assessed patients current status related to oxygen usage:   Assessed patients current status related to watery stool symptoms:  Assessed that home health services are being provided.            Our next appointment is by telephone on 02/01/23 at 11:00 am  Please call the care guide team at [phone] if you need to cancel or reschedule your appointment.   If you are experiencing a Mental Health or [place] or need someone to talk to, please call the Suicide and Crisis Lifeline: 988 call 1-800-273-TALK (toll free, 24 hour hotline)  Patient verbalizes understanding of instructions and care plan provided today and agrees to view in [place]. Active MyChart  status and patient understanding of how to access instructions and care plan via MyChart confirmed with patient.     Quinn Plowman RN,BSN,CCM [place] [facility] [phone] direct line

## 2023-01-09 NOTE — Patient Outreach (Signed)
  Care Coordination   Follow Up Visit Note   01/09/2023 Name: David Atkins MRN: [id number] DOB: 11/30/1929  David Atkins is a 89 y.o. year old male who sees Derrel Nip, Aris Everts, MD for primary care. I spoke with  [name] and daughter [name] by phone today.  What matters to the patients health and wellness today?  Per daughter, "Getting in to see surgeon to discuss next step regarding gallbladder surgery."     Goals Addressed             This Visit's Progress    "Getting in to see general surgeon" and post hospital follow up/ management       Interventions Today    Flowsheet Row Most Recent Value  Chronic Disease Discussed/Reviewed   Chronic disease discussed/reviewed during today's visit Other, Hypertension (HTN), Chronic Obstructive Pulmonary Disease (COPD)  [cholecystitis]  General Interventions   General Interventions Discussed/Reviewed General Interventions Discussed  Education Interventions   Education Provided Provided Verbal Education  Provided Verbal Education On When to see the doctor      Care Coordination Interventions: Evaluation of current treatment plan related to cholecystitis and patient's adherence to plan as established by provider:  Spoke with patient and daughter on call today.  Patient states he is doing much better than he was post hospital discharge. Daughter states patient has percutaneous cholecystomy tube in place.  She states it appears to be draining well and she reports emptying at least every 12 hours. Daughter states drain site is covered therefore unable to determine status of skin around site. Daughter states patient continues on Augmentin as prescribed. Daughter states it was mentioned while patient was in the hospital that he had congestive heart failure. She states this diagnosis has never been discussed with she or patient and they will discuss further at his cardiology visit.  Reviewed medications with patient and discussed  compliance Reviewed scheduled/upcoming provider appointments:  Daughter states patient is scheduled to see his cardiologist on 01/22/23 and general surgeon on 01/31/23.   Discussed plans with patient for ongoing care management follow up and provided patient with direct contact information for care management team:  Patient and daughter agreed to next telephone follow up with RNCM on 02/01/23.  Discussed signs/ symptoms of infection and advised to contact general surgeon if this is noted.  Assessed patients current status related to oxygen usage: Patient states he is not currently using the oxygen because his oxygen saturations on room air have been in the mid 90's.  Assessed patients current status related to watery stool symptoms:  Patient reports watery stools have subsided.   Assessed that home health services are being provided.:  Daughter states home OT/PT services have started and nurse is scheduled for first visit on tomorrow 01/10/23.  Daughter states services are being provided by [facility] agency.            SDOH assessments and interventions completed:  No     Care Coordination Interventions:  Yes, provided   Follow up plan: Follow up call scheduled for 02/01/23    Encounter Outcome:  Pt. Visit Completed   Quinn Plowman RN,BSN,CCM [place] [phone] direct line

## 2023-01-10 DIAGNOSIS — G8929 Other chronic pain: Secondary | ICD-10-CM | POA: Diagnosis not present

## 2023-01-10 DIAGNOSIS — Z4803 Encounter for change or removal of drains: Secondary | ICD-10-CM | POA: Diagnosis not present

## 2023-01-10 DIAGNOSIS — R197 Diarrhea, unspecified: Secondary | ICD-10-CM | POA: Diagnosis not present

## 2023-01-10 DIAGNOSIS — R42 Dizziness and giddiness: Secondary | ICD-10-CM | POA: Diagnosis not present

## 2023-01-10 DIAGNOSIS — K810 Acute cholecystitis: Secondary | ICD-10-CM | POA: Diagnosis not present

## 2023-01-10 DIAGNOSIS — I110 Hypertensive heart disease with heart failure: Secondary | ICD-10-CM | POA: Diagnosis not present

## 2023-01-10 DIAGNOSIS — I6523 Occlusion and stenosis of bilateral carotid arteries: Secondary | ICD-10-CM | POA: Diagnosis not present

## 2023-01-10 DIAGNOSIS — E785 Hyperlipidemia, unspecified: Secondary | ICD-10-CM | POA: Diagnosis not present

## 2023-01-10 DIAGNOSIS — G4733 Obstructive sleep apnea (adult) (pediatric): Secondary | ICD-10-CM | POA: Diagnosis not present

## 2023-01-10 DIAGNOSIS — I5032 Chronic diastolic (congestive) heart failure: Secondary | ICD-10-CM | POA: Diagnosis not present

## 2023-01-10 DIAGNOSIS — N3281 Overactive bladder: Secondary | ICD-10-CM | POA: Diagnosis not present

## 2023-01-10 DIAGNOSIS — K5900 Constipation, unspecified: Secondary | ICD-10-CM | POA: Diagnosis not present

## 2023-01-10 DIAGNOSIS — J449 Chronic obstructive pulmonary disease, unspecified: Secondary | ICD-10-CM | POA: Diagnosis not present

## 2023-01-10 DIAGNOSIS — R2681 Unsteadiness on feet: Secondary | ICD-10-CM | POA: Diagnosis not present

## 2023-01-10 DIAGNOSIS — I739 Peripheral vascular disease, unspecified: Secondary | ICD-10-CM | POA: Diagnosis not present

## 2023-01-10 DIAGNOSIS — A419 Sepsis, unspecified organism: Secondary | ICD-10-CM | POA: Diagnosis not present

## 2023-01-10 DIAGNOSIS — M40202 Unspecified kyphosis, cervical region: Secondary | ICD-10-CM | POA: Diagnosis not present

## 2023-01-10 DIAGNOSIS — I6789 Other cerebrovascular disease: Secondary | ICD-10-CM | POA: Diagnosis not present

## 2023-01-10 DIAGNOSIS — K8020 Calculus of gallbladder without cholecystitis without obstruction: Secondary | ICD-10-CM | POA: Diagnosis not present

## 2023-01-10 DIAGNOSIS — R609 Edema, unspecified: Secondary | ICD-10-CM | POA: Diagnosis not present

## 2023-01-10 DIAGNOSIS — R1031 Right lower quadrant pain: Secondary | ICD-10-CM | POA: Diagnosis not present

## 2023-01-17 DIAGNOSIS — J449 Chronic obstructive pulmonary disease, unspecified: Secondary | ICD-10-CM | POA: Diagnosis not present

## 2023-01-17 DIAGNOSIS — R1031 Right lower quadrant pain: Secondary | ICD-10-CM | POA: Diagnosis not present

## 2023-01-17 DIAGNOSIS — K8020 Calculus of gallbladder without cholecystitis without obstruction: Secondary | ICD-10-CM | POA: Diagnosis not present

## 2023-01-17 DIAGNOSIS — R2681 Unsteadiness on feet: Secondary | ICD-10-CM | POA: Diagnosis not present

## 2023-01-17 DIAGNOSIS — K5900 Constipation, unspecified: Secondary | ICD-10-CM | POA: Diagnosis not present

## 2023-01-17 DIAGNOSIS — M40202 Unspecified kyphosis, cervical region: Secondary | ICD-10-CM | POA: Diagnosis not present

## 2023-01-17 DIAGNOSIS — G4733 Obstructive sleep apnea (adult) (pediatric): Secondary | ICD-10-CM | POA: Diagnosis not present

## 2023-01-17 DIAGNOSIS — R42 Dizziness and giddiness: Secondary | ICD-10-CM | POA: Diagnosis not present

## 2023-01-17 DIAGNOSIS — E785 Hyperlipidemia, unspecified: Secondary | ICD-10-CM | POA: Diagnosis not present

## 2023-01-17 DIAGNOSIS — K810 Acute cholecystitis: Secondary | ICD-10-CM | POA: Diagnosis not present

## 2023-01-17 DIAGNOSIS — I5032 Chronic diastolic (congestive) heart failure: Secondary | ICD-10-CM | POA: Diagnosis not present

## 2023-01-17 DIAGNOSIS — I6523 Occlusion and stenosis of bilateral carotid arteries: Secondary | ICD-10-CM | POA: Diagnosis not present

## 2023-01-17 DIAGNOSIS — N3281 Overactive bladder: Secondary | ICD-10-CM | POA: Diagnosis not present

## 2023-01-17 DIAGNOSIS — I739 Peripheral vascular disease, unspecified: Secondary | ICD-10-CM | POA: Diagnosis not present

## 2023-01-17 DIAGNOSIS — A419 Sepsis, unspecified organism: Secondary | ICD-10-CM | POA: Diagnosis not present

## 2023-01-17 DIAGNOSIS — G8929 Other chronic pain: Secondary | ICD-10-CM | POA: Diagnosis not present

## 2023-01-17 DIAGNOSIS — R609 Edema, unspecified: Secondary | ICD-10-CM | POA: Diagnosis not present

## 2023-01-17 DIAGNOSIS — I6789 Other cerebrovascular disease: Secondary | ICD-10-CM | POA: Diagnosis not present

## 2023-01-17 DIAGNOSIS — Z4803 Encounter for change or removal of drains: Secondary | ICD-10-CM | POA: Diagnosis not present

## 2023-01-17 DIAGNOSIS — R197 Diarrhea, unspecified: Secondary | ICD-10-CM | POA: Diagnosis not present

## 2023-01-17 DIAGNOSIS — I110 Hypertensive heart disease with heart failure: Secondary | ICD-10-CM | POA: Diagnosis not present

## 2023-01-18 DIAGNOSIS — R2681 Unsteadiness on feet: Secondary | ICD-10-CM | POA: Diagnosis not present

## 2023-01-18 DIAGNOSIS — E785 Hyperlipidemia, unspecified: Secondary | ICD-10-CM | POA: Diagnosis not present

## 2023-01-18 DIAGNOSIS — K810 Acute cholecystitis: Secondary | ICD-10-CM | POA: Diagnosis not present

## 2023-01-18 DIAGNOSIS — R42 Dizziness and giddiness: Secondary | ICD-10-CM | POA: Diagnosis not present

## 2023-01-18 DIAGNOSIS — I739 Peripheral vascular disease, unspecified: Secondary | ICD-10-CM | POA: Diagnosis not present

## 2023-01-18 DIAGNOSIS — I5032 Chronic diastolic (congestive) heart failure: Secondary | ICD-10-CM | POA: Diagnosis not present

## 2023-01-18 DIAGNOSIS — K8020 Calculus of gallbladder without cholecystitis without obstruction: Secondary | ICD-10-CM | POA: Diagnosis not present

## 2023-01-18 DIAGNOSIS — R609 Edema, unspecified: Secondary | ICD-10-CM | POA: Diagnosis not present

## 2023-01-18 DIAGNOSIS — I110 Hypertensive heart disease with heart failure: Secondary | ICD-10-CM | POA: Diagnosis not present

## 2023-01-18 DIAGNOSIS — R1031 Right lower quadrant pain: Secondary | ICD-10-CM | POA: Diagnosis not present

## 2023-01-18 DIAGNOSIS — A419 Sepsis, unspecified organism: Secondary | ICD-10-CM | POA: Diagnosis not present

## 2023-01-18 DIAGNOSIS — M40202 Unspecified kyphosis, cervical region: Secondary | ICD-10-CM | POA: Diagnosis not present

## 2023-01-18 DIAGNOSIS — G4733 Obstructive sleep apnea (adult) (pediatric): Secondary | ICD-10-CM | POA: Diagnosis not present

## 2023-01-18 DIAGNOSIS — G8929 Other chronic pain: Secondary | ICD-10-CM | POA: Diagnosis not present

## 2023-01-18 DIAGNOSIS — I6523 Occlusion and stenosis of bilateral carotid arteries: Secondary | ICD-10-CM | POA: Diagnosis not present

## 2023-01-18 DIAGNOSIS — Z4803 Encounter for change or removal of drains: Secondary | ICD-10-CM | POA: Diagnosis not present

## 2023-01-18 DIAGNOSIS — R197 Diarrhea, unspecified: Secondary | ICD-10-CM | POA: Diagnosis not present

## 2023-01-18 DIAGNOSIS — J449 Chronic obstructive pulmonary disease, unspecified: Secondary | ICD-10-CM | POA: Diagnosis not present

## 2023-01-18 DIAGNOSIS — I6789 Other cerebrovascular disease: Secondary | ICD-10-CM | POA: Diagnosis not present

## 2023-01-18 DIAGNOSIS — K5900 Constipation, unspecified: Secondary | ICD-10-CM | POA: Diagnosis not present

## 2023-01-18 DIAGNOSIS — N3281 Overactive bladder: Secondary | ICD-10-CM | POA: Diagnosis not present

## 2023-01-21 NOTE — Progress Notes (Unsigned)
Home Cardiology Office Note  Date:  01/22/2023   ID:  David Atkins, DOB Apr 09, 1929, MRN [id number]  PCP:  [name], MD   Chief Complaint  Patient presents with   3 month follow up     Patient c/o bilateral LE edema. Medications reviewed by the patient verbally.     HPI:  David Atkins is a very pleasant 89 year old gentleman with a history of  coronary artery disease, bypass surgery in 2006 at [facility] in [place],  mild carotid arterial disease by report,  hyperlipidemia,  smoking history in the remote past,  aortic athero on CT 08/2018 who presents for routine followup of his coronary artery disease , syncope, vasovagal syncope in the setting of diarrhea  Last seen in clinic November 2023 Presents today with family, son and daughter Admitted to [facility] on Jan 24 with sepsis secondary to acute cholecystitis,   lactic acid was normal and blood cultures negative.  Placed on supplemental oxygen for hyposemai attributed to CHD (BNP was 700) .  Underwent percutaneous cholcystostomy tube and empiric abx . Discharged home on sat jan 26 with augmentin.     He is interested in having cholecystectomy, very bothered by having the drain in place  In follow-up today reports he is slowly getting stronger, feels back to his baseline Doing therapy  No diarrhea, better on probiotics Denies any near-syncope or syncope Using walker No chest pain concerning for angina  Minimal leg swelling on today's visit, has compression hose in place  Recent cardiac testing reviewed Echo: normal EF, ejection fraction is 60%  Zio completed, no secondary arrhythmia noted   EKG personally reviewed by myself on todays visit shows normal sinus rhythm with rate 65 bpm, no significant ST or T-wave changes   Other past medical history Medical records reviewed, was seen in the emergency room admitted to the hospital with syncope January 2020 --walking into his house today when he got  dizzy and passed out.  He did fall and hit his head , memory difficulty related to the events  in the waiting room  in the ED, he nearly passed out again and appeared very pale.  Normal labs It does not appear that any medication changes were made at discharge Etiology unclear, possibly vasovagal   01/06/2014, he reported that he had general malaise, blood pressure checked showed systolic pressure greater than 200. He went to the emergency room for further evaluation. Workup there was negative including normal basic metabolic panel, negative cardiac enzymes. CT scan showed chronic microvascular changes in the deep white matter, and he was discharged home after medical management of his blood pressure. On a previous visit, his Son was concerned about various arguments he may be having with other family members at home and the stress this may be causing him.     PMH:   has a past medical history of 3-vessel coronary artery disease (2006), basal cell, Basal cell carcinoma (07/09/2012), BPH (benign prostatic hyperplasia), Concussion w loss of consciousness of unsp duration, subs (12/29/2018), GERD (gastroesophageal reflux disease), Hyperlipidemia, Hypertension, Lumbar stenosis (12/03/2013), Myocardial infarction ([place]) (2006), OAB (overactive bladder), Osteoporosis (2010), Sleep apnea, and Spondylolisthesis of lumbar region (11/23/2013).  PSH:    Past Surgical History:  Procedure Laterality Date   APPENDECTOMY  1951   COLONOSCOPY     COLONOSCOPY WITH PROPOFOL N/A 11/15/2015   Procedure: COLONOSCOPY WITH PROPOFOL;  Surgeon: Hulen Luster, MD;  Location: [facility] ENDOSCOPY;  Service: Gastroenterology;  Laterality: N/A;  CORONARY ARTERY BYPASS GRAFT  2006   3 vessel, s/p AMI   IR PERC CHOLECYSTOSTOMY  12/27/2022   NASAL SEPTOPLASTY W/ TURBINOPLASTY  1981   TONSILLECTOMY      Current Outpatient Medications  Medication Sig Dispense Refill   acetaminophen (TYLENOL) 500 MG tablet Take 500 mg by mouth at  bedtime.      amLODipine (NORVASC) 2.5 MG tablet TAKE 1 TABLET BY MOUTH DAILY 100 tablet 2   aspirin 81 MG EC tablet Take 81 mg by mouth daily.      atorvastatin (LIPITOR) 20 MG tablet Take 1 tablet (20 mg total) by mouth daily. 90 tablet 3   Blood Pressure Monitoring (BLOOD PRESSURE MONITOR AUTOMAT) DEVI Use daily to check blood pressure 1 Device 0   cholecalciferol (VITAMIN D) 25 MCG (1000 UNIT) tablet Take 1,000 Units by mouth daily.     diclofenac Sodium (VOLTAREN) 1 % GEL APPLY 4 GRAMS TOPICALLY 4  TIMES DAILY 400 g 0   finasteride (PROSCAR) 5 MG tablet TAKE 1 TABLET BY MOUTH  DAILY 100 tablet 2   lisinopril (ZESTRIL) 20 MG tablet TAKE 1 TABLET BY MOUTH DAILY 100 tablet 2   metoprolol tartrate (LOPRESSOR) 25 MG tablet TAKE ONE-HALF TABLET BY MOUTH  TWICE DAILY 100 tablet 2   nitroGLYCERIN (NITROSTAT) 0.4 MG SL tablet Place 1 tablet (0.4 mg total) under the tongue every 5 (five) minutes as needed for chest pain. Maximum of 3 doses. 25 tablet 3   omeprazole (PRILOSEC) 20 MG capsule TAKE 2 CAPSULES BY MOUTH  DAILY 180 capsule 3   oxybutynin (DITROPAN) 5 MG tablet TAKE 1 TABLET BY MOUTH  TWICE DAILY 200 tablet 2   polyethylene glycol powder (GLYCOLAX/MIRALAX) powder Take 17 g by mouth once. Dissolve 1 capful of power into any liquid and drink once daily (Patient taking differently: Take 17 g by mouth at bedtime. Dissolve 1 capful of power into any liquid and drink once daily) 500 g 0   potassium chloride SA (KLOR-CON M) 20 MEQ tablet Take 1 tablet (20 mEq total) by mouth 2 (two) times daily. With food 4 tablet 0   rOPINIRole (REQUIP) 0.25 MG tablet TAKE 1 TABLET BY MOUTH AT  BEDTIME 100 tablet 2   Simethicone Extra Strength 125 MG CAPS Take 125 mg by mouth as needed (gas).     Syringe/Needle, Disp, (SYRINGE 3CC/25GX1") 25G X 1" 3 ML MISC Use for b12 injections 50 each 0   tamsulosin (FLOMAX) 0.4 MG CAPS capsule TAKE 1 CAPSULE BY MOUTH  DAILY AFTER BREAKFAST 100 capsule 2   No current  facility-administered medications for this visit.     Allergies:   Sulfa antibiotics   Social History:  The patient  reports that he quit smoking about 59 years ago. His smoking use included cigarettes. He has never used smokeless tobacco. He reports that he does not drink alcohol and does not use drugs.   Family History:   family history includes Hyperlipidemia in his mother; Hypertension in his father and mother.   Review of Systems: Review of Systems  Constitutional: Negative.   Respiratory: Negative.    Cardiovascular: Negative.   Gastrointestinal:  Positive for diarrhea.  Musculoskeletal:  Positive for back pain.  Neurological:  Positive for loss of consciousness.  Psychiatric/Behavioral: Negative.    All other systems reviewed and are negative.   PHYSICAL EXAM: VS:  Ht 5' 10"$  (1.778 m)   Wt 184 lb (83.5 kg)   SpO2 90%   BMI 26.40  kg/m  , BMI Body mass index is 26.4 kg/m. Constitutional:  oriented to person, place, and time. No distress.  HENT:  Head: Grossly normal Eyes:  no discharge. No scleral icterus.  Neck: No JVD, no carotid bruits  Cardiovascular: Regular rate and rhythm, no murmurs appreciated Pulmonary/Chest: Clear to auscultation bilaterally, no wheezes or rails Abdominal: Soft.  no distension.  no tenderness.  Musculoskeletal: Normal range of motion Neurological:  normal muscle tone. Coordination normal. No atrophy Skin: Skin warm and dry Psychiatric: normal affect, pleasant  Recent Labs: 10/24/2022: TSH 1.74 12/27/2022: B Natriuretic Peptide 723.5 01/02/2023: ALT 27; BUN 12; Creatinine, Ser 0.77; Hemoglobin 14.2; Magnesium 2.1; Platelets 262; Potassium 3.2; Sodium 142    Lipid Panel Lab Results  Component Value Date   CHOL 98 12/07/2022   HDL 39.50 12/07/2022   LDLCALC 40 12/07/2022   TRIG 92.0 12/07/2022    Wt Readings from Last 3 Encounters:  01/22/23 184 lb (83.5 kg)  01/02/23 188 lb 6.4 oz (85.5 kg)  12/27/22 192 lb 7.4 oz (87.3 kg)     ASSESSMENT AND PLAN: Preop cardiovascular evaluation Moderate but acceptable risk for cholecystectomy No further testing needed   3-vessel coronary artery disease -  Currently with no symptoms of angina. No further workup at this time. Continue current medication regimen.  Essential hypertension  Blood pressure is well controlled on today's visit. No changes made to the medications.  Syncope/near syncope History of vasovagal near syncope associated with diarrhea spells Reports no recent GI issues  Symptoms improved with probiotics  Cholecystitis Recent hospitalization with fever tachycardia tachypnea leukocytosis Has drain in place Treated with antibiotics in the hospital He has follow-up with general surgery, bothered by the drain, the smell, the lifestyle limitations He is interested in cholecystectomy  Lower extremity swelling/edema Likely component of dependent edema,  Continue compression hose Minimal edema on today's visit  Diarrhea Symptoms improved with probiotics  Hyperlipidemia LDL goal <70 Cholesterol is at goal on the current lipid regimen. No changes to the medications were made.  Bilateral carotid artery stenosis History of mild bilateral carotid disease Cholesterol numbers at goal .  Hx of CABG Stable,  no new testing indicated at this time  OSA On CPAP  Long discussion concerning recent hospitalization, risk and benefit of gallbladder surgery  Total encounter time more than 40 minutes  Greater than 50% was spent in counseling and coordination of care with the patient   No orders of the defined types were placed in this encounter.    Signed, Esmond Plants, M.D., Ph.D. 01/22/2023  [place], [place]

## 2023-01-22 ENCOUNTER — Encounter: Payer: Self-pay | Admitting: Cardiovascular Disease

## 2023-01-22 ENCOUNTER — Ambulatory Visit: Payer: Medicare Other | Attending: Cardiovascular Disease | Admitting: Cardiovascular Disease

## 2023-01-22 VITALS — BP 124/56 | HR 65 | Ht 70.0 in | Wt 184.0 lb

## 2023-01-22 DIAGNOSIS — I5032 Chronic diastolic (congestive) heart failure: Secondary | ICD-10-CM | POA: Diagnosis not present

## 2023-01-22 DIAGNOSIS — I739 Peripheral vascular disease, unspecified: Secondary | ICD-10-CM

## 2023-01-22 DIAGNOSIS — I6523 Occlusion and stenosis of bilateral carotid arteries: Secondary | ICD-10-CM

## 2023-01-22 DIAGNOSIS — I25118 Atherosclerotic heart disease of native coronary artery with other forms of angina pectoris: Secondary | ICD-10-CM

## 2023-01-22 DIAGNOSIS — I679 Cerebrovascular disease, unspecified: Secondary | ICD-10-CM | POA: Diagnosis not present

## 2023-01-22 DIAGNOSIS — R600 Localized edema: Secondary | ICD-10-CM | POA: Diagnosis not present

## 2023-01-22 DIAGNOSIS — I10 Essential (primary) hypertension: Secondary | ICD-10-CM | POA: Diagnosis not present

## 2023-01-22 DIAGNOSIS — E785 Hyperlipidemia, unspecified: Secondary | ICD-10-CM | POA: Diagnosis not present

## 2023-01-22 NOTE — Patient Instructions (Addendum)
Medication Instructions:  No changes  If you need a refill on your cardiac medications before your next appointment, please call your pharmacy.    Lab work: No new labs needed   Testing/Procedures: No new testing needed   Follow-Up: At [facility], you and your health needs are our priority.  As part of our continuing mission to provide you with exceptional heart care, we have created designated Provider Care Teams.  These Care Teams include your primary Cardiologist (physician) and Advanced Practice Providers (APPs -  Physician Assistants and Nurse Practitioners) who all work together to provide you with the care you need, when you need it.  You will need a follow up appointment in 6 months  Providers on your designated Care Team:   Christopher Berge, NP Ryan Dunn, PA-C Cadence Furth, PA-C  COVID-19 Vaccine Information can be found at: [email or web address] For questions related to vaccine distribution or appointments, please email [email or web address] or call [phone].   

## 2023-01-23 DIAGNOSIS — R197 Diarrhea, unspecified: Secondary | ICD-10-CM | POA: Diagnosis not present

## 2023-01-23 DIAGNOSIS — M40202 Unspecified kyphosis, cervical region: Secondary | ICD-10-CM | POA: Diagnosis not present

## 2023-01-23 DIAGNOSIS — E785 Hyperlipidemia, unspecified: Secondary | ICD-10-CM | POA: Diagnosis not present

## 2023-01-23 DIAGNOSIS — K8020 Calculus of gallbladder without cholecystitis without obstruction: Secondary | ICD-10-CM | POA: Diagnosis not present

## 2023-01-23 DIAGNOSIS — K810 Acute cholecystitis: Secondary | ICD-10-CM | POA: Diagnosis not present

## 2023-01-23 DIAGNOSIS — J449 Chronic obstructive pulmonary disease, unspecified: Secondary | ICD-10-CM | POA: Diagnosis not present

## 2023-01-23 DIAGNOSIS — I739 Peripheral vascular disease, unspecified: Secondary | ICD-10-CM | POA: Diagnosis not present

## 2023-01-23 DIAGNOSIS — I110 Hypertensive heart disease with heart failure: Secondary | ICD-10-CM | POA: Diagnosis not present

## 2023-01-23 DIAGNOSIS — G8929 Other chronic pain: Secondary | ICD-10-CM | POA: Diagnosis not present

## 2023-01-23 DIAGNOSIS — Z4803 Encounter for change or removal of drains: Secondary | ICD-10-CM | POA: Diagnosis not present

## 2023-01-23 DIAGNOSIS — A419 Sepsis, unspecified organism: Secondary | ICD-10-CM | POA: Diagnosis not present

## 2023-01-23 DIAGNOSIS — K5900 Constipation, unspecified: Secondary | ICD-10-CM | POA: Diagnosis not present

## 2023-01-23 DIAGNOSIS — I6523 Occlusion and stenosis of bilateral carotid arteries: Secondary | ICD-10-CM | POA: Diagnosis not present

## 2023-01-23 DIAGNOSIS — I6789 Other cerebrovascular disease: Secondary | ICD-10-CM | POA: Diagnosis not present

## 2023-01-23 DIAGNOSIS — R1031 Right lower quadrant pain: Secondary | ICD-10-CM | POA: Diagnosis not present

## 2023-01-23 DIAGNOSIS — R609 Edema, unspecified: Secondary | ICD-10-CM | POA: Diagnosis not present

## 2023-01-23 DIAGNOSIS — G4733 Obstructive sleep apnea (adult) (pediatric): Secondary | ICD-10-CM | POA: Diagnosis not present

## 2023-01-23 DIAGNOSIS — N3281 Overactive bladder: Secondary | ICD-10-CM | POA: Diagnosis not present

## 2023-01-23 DIAGNOSIS — I5032 Chronic diastolic (congestive) heart failure: Secondary | ICD-10-CM | POA: Diagnosis not present

## 2023-01-23 DIAGNOSIS — R2681 Unsteadiness on feet: Secondary | ICD-10-CM | POA: Diagnosis not present

## 2023-01-23 DIAGNOSIS — R42 Dizziness and giddiness: Secondary | ICD-10-CM | POA: Diagnosis not present

## 2023-01-24 DIAGNOSIS — I110 Hypertensive heart disease with heart failure: Secondary | ICD-10-CM | POA: Diagnosis not present

## 2023-01-24 DIAGNOSIS — K5900 Constipation, unspecified: Secondary | ICD-10-CM | POA: Diagnosis not present

## 2023-01-24 DIAGNOSIS — R1031 Right lower quadrant pain: Secondary | ICD-10-CM | POA: Diagnosis not present

## 2023-01-24 DIAGNOSIS — I6789 Other cerebrovascular disease: Secondary | ICD-10-CM | POA: Diagnosis not present

## 2023-01-24 DIAGNOSIS — R197 Diarrhea, unspecified: Secondary | ICD-10-CM | POA: Diagnosis not present

## 2023-01-24 DIAGNOSIS — R2681 Unsteadiness on feet: Secondary | ICD-10-CM | POA: Diagnosis not present

## 2023-01-24 DIAGNOSIS — I6523 Occlusion and stenosis of bilateral carotid arteries: Secondary | ICD-10-CM | POA: Diagnosis not present

## 2023-01-24 DIAGNOSIS — G4733 Obstructive sleep apnea (adult) (pediatric): Secondary | ICD-10-CM | POA: Diagnosis not present

## 2023-01-24 DIAGNOSIS — G8929 Other chronic pain: Secondary | ICD-10-CM | POA: Diagnosis not present

## 2023-01-24 DIAGNOSIS — N3281 Overactive bladder: Secondary | ICD-10-CM | POA: Diagnosis not present

## 2023-01-24 DIAGNOSIS — Z4803 Encounter for change or removal of drains: Secondary | ICD-10-CM | POA: Diagnosis not present

## 2023-01-24 DIAGNOSIS — R42 Dizziness and giddiness: Secondary | ICD-10-CM | POA: Diagnosis not present

## 2023-01-24 DIAGNOSIS — M40202 Unspecified kyphosis, cervical region: Secondary | ICD-10-CM | POA: Diagnosis not present

## 2023-01-24 DIAGNOSIS — J449 Chronic obstructive pulmonary disease, unspecified: Secondary | ICD-10-CM | POA: Diagnosis not present

## 2023-01-24 DIAGNOSIS — K8020 Calculus of gallbladder without cholecystitis without obstruction: Secondary | ICD-10-CM | POA: Diagnosis not present

## 2023-01-24 DIAGNOSIS — A419 Sepsis, unspecified organism: Secondary | ICD-10-CM | POA: Diagnosis not present

## 2023-01-24 DIAGNOSIS — E785 Hyperlipidemia, unspecified: Secondary | ICD-10-CM | POA: Diagnosis not present

## 2023-01-24 DIAGNOSIS — R609 Edema, unspecified: Secondary | ICD-10-CM | POA: Diagnosis not present

## 2023-01-24 DIAGNOSIS — K810 Acute cholecystitis: Secondary | ICD-10-CM | POA: Diagnosis not present

## 2023-01-24 DIAGNOSIS — I739 Peripheral vascular disease, unspecified: Secondary | ICD-10-CM | POA: Diagnosis not present

## 2023-01-24 DIAGNOSIS — I5032 Chronic diastolic (congestive) heart failure: Secondary | ICD-10-CM | POA: Diagnosis not present

## 2023-01-25 DIAGNOSIS — R609 Edema, unspecified: Secondary | ICD-10-CM | POA: Diagnosis not present

## 2023-01-25 DIAGNOSIS — I5032 Chronic diastolic (congestive) heart failure: Secondary | ICD-10-CM | POA: Diagnosis not present

## 2023-01-25 DIAGNOSIS — R42 Dizziness and giddiness: Secondary | ICD-10-CM | POA: Diagnosis not present

## 2023-01-25 DIAGNOSIS — I739 Peripheral vascular disease, unspecified: Secondary | ICD-10-CM | POA: Diagnosis not present

## 2023-01-25 DIAGNOSIS — J449 Chronic obstructive pulmonary disease, unspecified: Secondary | ICD-10-CM | POA: Diagnosis not present

## 2023-01-25 DIAGNOSIS — K810 Acute cholecystitis: Secondary | ICD-10-CM | POA: Diagnosis not present

## 2023-01-25 DIAGNOSIS — R2681 Unsteadiness on feet: Secondary | ICD-10-CM | POA: Diagnosis not present

## 2023-01-25 DIAGNOSIS — G8929 Other chronic pain: Secondary | ICD-10-CM | POA: Diagnosis not present

## 2023-01-25 DIAGNOSIS — R1031 Right lower quadrant pain: Secondary | ICD-10-CM | POA: Diagnosis not present

## 2023-01-25 DIAGNOSIS — I110 Hypertensive heart disease with heart failure: Secondary | ICD-10-CM | POA: Diagnosis not present

## 2023-01-25 DIAGNOSIS — K5900 Constipation, unspecified: Secondary | ICD-10-CM | POA: Diagnosis not present

## 2023-01-25 DIAGNOSIS — N3281 Overactive bladder: Secondary | ICD-10-CM | POA: Diagnosis not present

## 2023-01-25 DIAGNOSIS — M40202 Unspecified kyphosis, cervical region: Secondary | ICD-10-CM | POA: Diagnosis not present

## 2023-01-25 DIAGNOSIS — G4733 Obstructive sleep apnea (adult) (pediatric): Secondary | ICD-10-CM | POA: Diagnosis not present

## 2023-01-25 DIAGNOSIS — K8020 Calculus of gallbladder without cholecystitis without obstruction: Secondary | ICD-10-CM | POA: Diagnosis not present

## 2023-01-25 DIAGNOSIS — Z4803 Encounter for change or removal of drains: Secondary | ICD-10-CM | POA: Diagnosis not present

## 2023-01-25 DIAGNOSIS — E785 Hyperlipidemia, unspecified: Secondary | ICD-10-CM | POA: Diagnosis not present

## 2023-01-25 DIAGNOSIS — A419 Sepsis, unspecified organism: Secondary | ICD-10-CM | POA: Diagnosis not present

## 2023-01-25 DIAGNOSIS — R197 Diarrhea, unspecified: Secondary | ICD-10-CM | POA: Diagnosis not present

## 2023-01-25 DIAGNOSIS — I6789 Other cerebrovascular disease: Secondary | ICD-10-CM | POA: Diagnosis not present

## 2023-01-25 DIAGNOSIS — I6523 Occlusion and stenosis of bilateral carotid arteries: Secondary | ICD-10-CM | POA: Diagnosis not present

## 2023-01-29 DIAGNOSIS — I10 Essential (primary) hypertension: Secondary | ICD-10-CM | POA: Diagnosis not present

## 2023-01-29 DIAGNOSIS — M6281 Muscle weakness (generalized): Secondary | ICD-10-CM | POA: Diagnosis not present

## 2023-01-29 DIAGNOSIS — G4733 Obstructive sleep apnea (adult) (pediatric): Secondary | ICD-10-CM | POA: Diagnosis not present

## 2023-01-30 DIAGNOSIS — I6789 Other cerebrovascular disease: Secondary | ICD-10-CM | POA: Diagnosis not present

## 2023-01-30 DIAGNOSIS — R42 Dizziness and giddiness: Secondary | ICD-10-CM | POA: Diagnosis not present

## 2023-01-30 DIAGNOSIS — G8929 Other chronic pain: Secondary | ICD-10-CM | POA: Diagnosis not present

## 2023-01-30 DIAGNOSIS — E785 Hyperlipidemia, unspecified: Secondary | ICD-10-CM | POA: Diagnosis not present

## 2023-01-30 DIAGNOSIS — I739 Peripheral vascular disease, unspecified: Secondary | ICD-10-CM | POA: Diagnosis not present

## 2023-01-30 DIAGNOSIS — I6523 Occlusion and stenosis of bilateral carotid arteries: Secondary | ICD-10-CM | POA: Diagnosis not present

## 2023-01-30 DIAGNOSIS — Z4803 Encounter for change or removal of drains: Secondary | ICD-10-CM | POA: Diagnosis not present

## 2023-01-30 DIAGNOSIS — R197 Diarrhea, unspecified: Secondary | ICD-10-CM | POA: Diagnosis not present

## 2023-01-30 DIAGNOSIS — N3281 Overactive bladder: Secondary | ICD-10-CM | POA: Diagnosis not present

## 2023-01-30 DIAGNOSIS — I5032 Chronic diastolic (congestive) heart failure: Secondary | ICD-10-CM | POA: Diagnosis not present

## 2023-01-30 DIAGNOSIS — J449 Chronic obstructive pulmonary disease, unspecified: Secondary | ICD-10-CM | POA: Diagnosis not present

## 2023-01-30 DIAGNOSIS — K5900 Constipation, unspecified: Secondary | ICD-10-CM | POA: Diagnosis not present

## 2023-01-30 DIAGNOSIS — K810 Acute cholecystitis: Secondary | ICD-10-CM | POA: Diagnosis not present

## 2023-01-30 DIAGNOSIS — I110 Hypertensive heart disease with heart failure: Secondary | ICD-10-CM | POA: Diagnosis not present

## 2023-01-30 DIAGNOSIS — M40202 Unspecified kyphosis, cervical region: Secondary | ICD-10-CM | POA: Diagnosis not present

## 2023-01-30 DIAGNOSIS — G4733 Obstructive sleep apnea (adult) (pediatric): Secondary | ICD-10-CM | POA: Diagnosis not present

## 2023-01-30 DIAGNOSIS — R2681 Unsteadiness on feet: Secondary | ICD-10-CM | POA: Diagnosis not present

## 2023-01-30 DIAGNOSIS — K8020 Calculus of gallbladder without cholecystitis without obstruction: Secondary | ICD-10-CM | POA: Diagnosis not present

## 2023-01-30 DIAGNOSIS — A419 Sepsis, unspecified organism: Secondary | ICD-10-CM | POA: Diagnosis not present

## 2023-01-30 DIAGNOSIS — R1031 Right lower quadrant pain: Secondary | ICD-10-CM | POA: Diagnosis not present

## 2023-01-30 DIAGNOSIS — R609 Edema, unspecified: Secondary | ICD-10-CM | POA: Diagnosis not present

## 2023-01-31 ENCOUNTER — Other Ambulatory Visit: Payer: Self-pay

## 2023-01-31 ENCOUNTER — Ambulatory Visit (INDEPENDENT_AMBULATORY_CARE_PROVIDER_SITE_OTHER): Payer: Medicare Other | Admitting: Surgery

## 2023-01-31 ENCOUNTER — Encounter: Payer: Self-pay | Admitting: Surgery

## 2023-01-31 VITALS — BP 148/80 | HR 93 | Temp 98.1°F | Ht 70.0 in | Wt 180.0 lb

## 2023-01-31 DIAGNOSIS — K810 Acute cholecystitis: Secondary | ICD-10-CM

## 2023-01-31 NOTE — Patient Instructions (Signed)
I will contact you to let you know when the Cholangiogram will be scheduled.   Please see your follow up appointment listed below.

## 2023-02-01 ENCOUNTER — Telehealth: Payer: Self-pay

## 2023-02-01 ENCOUNTER — Ambulatory Visit: Payer: Self-pay

## 2023-02-01 ENCOUNTER — Encounter: Payer: Self-pay | Admitting: Surgery

## 2023-02-01 NOTE — Patient Instructions (Signed)
Visit Information  Thank you for taking time to visit with me today. Please don't hesitate to contact me if I can be of assistance to you.   Following are the goals we discussed today:  Notify provider for increase in diarrhea Continue monitoring blood pressures daily and record. Report increase in low blood pressures to provider. Keep an eye on drain site for signs of infection:  Redness, drainage, swelling, fever, odor.    Our next appointment is by telephone on 03/08/23 at 11:30 am  Please call the care guide team at [phone] if you need to cancel or reschedule your appointment.   If you are experiencing a Mental Health or [place] or need someone to talk to, please call the Suicide and Crisis Lifeline: 988 call 1-800-273-TALK (toll free, 24 hour hotline)  Patient verbalizes understanding of instructions and care plan provided today and agrees to view in [place]. Active MyChart status and patient understanding of how to access instructions and care plan via MyChart confirmed with patient.     Quinn Plowman RN,BSN,CCM [place] Coordination [phone] direct line

## 2023-02-01 NOTE — Patient Outreach (Signed)
  Care Coordination   Follow Up Visit Note   02/01/2023 Name: David Atkins MRN: [id number] DOB: [birth year]  David Atkins is a 89 y.o. year old male who sees Derrel Nip, Aris Everts, MD for primary care. I spoke with  [name] by phone today.  What matters to the patients health and wellness today?  Patient states he is doing well and getting stronger. Patient states the diarrhea comes and goes.  Patient reports having follow up visit with general surgeon on yesterday. He states surgeon wants to wait 30 days or so to reassess before making decision on Gallbladder surgery.  Patient denies any signs of infection at drain site.  Patient reports having a low blood pressure at provider office of 129/56.  He denies having any symptoms of dizziness or lightheadedness.     Goals Addressed             This Visit's Progress    "Getting in to see general surgeon" and post hospital follow up/ management       Interventions Today    Flowsheet Row Most Recent Value  Chronic Disease   Chronic disease during today's visit Other, Hypertension (HTN)  [cholecystitis]  General Interventions   General Interventions Discussed/Reviewed General Interventions Reviewed, Doctor Visits  [Reviewed signs of infection. Advised to notify provider for signs/symptoms of infection at drain sight.  Assessed pain level.  Patient advised to monitor BP daily and record and notify provider for ongoing low blood pressures.]  Doctor Visits Discussed/Reviewed Doctor Visits Reviewed  Jolinda Croak upcoming/ scheduled provider visits.]  Pharmacy Interventions   Pharmacy Dicussed/Reviewed Pharmacy Topics Reviewed  [Medications reviewed and compliance discussed.]     Verbally assessed for ongoing diarrhea          SDOH assessments and interventions completed:  No     Care Coordination Interventions:  Yes, provided   Follow up plan: Follow up call scheduled for 03/08/23    Encounter Outcome:  Pt. Visit Completed   Quinn Plowman RN,BSN,CCM [place] [phone] direct line

## 2023-02-01 NOTE — Progress Notes (Signed)
Outpatient Surgical Follow Up  02/01/2023  David Atkins is an 89 y.o. male.   Chief Complaint  Patient presents with   Follow-up    Cholecystitis w/cholecystostomy drain    HPI:  89 year old male with prior history of cholecystitis.  He does have significant comorbidities to include coronary artery disease, CHF, hypertension and prior stroke.  He underwent cholecystostomy tube a month ago and was hospitalized for a few days.  He now returns accompanied by his son.  He is still independent but walks with a walker.  His mind is pristine. He does have significant kyphosis and spinal mobility issues.  He denies any recent cardiac issues and was actually seen by cardiology and they did not find any acute coronary or cardiac issues at this time.  He also saw primary care doctor and seems to be optimized for potential intervention.  He denies any fevers any chills.  He does have some discomfort at the exit drain site.  No evidence of cholangitis no evidence of biliary obstruction Cystostomy tube is draining well.   Past Medical History:  Diagnosis Date   3-vessel coronary artery disease 2006   [place]   basal cell    Basal cell carcinoma 07/09/2012   BPH (benign prostatic hyperplasia)    Concussion w loss of consciousness of unsp duration, subs 12/29/2018   GERD (gastroesophageal reflux disease)    Hyperlipidemia    Hypertension    Lumbar stenosis 12/03/2013   MRI - lumbar pain with radiation down to LE   Myocardial infarction [facility]) 2006   OAB (overactive bladder)    Osteoporosis 2010   by DEXA at [place] apnea    Spondylolisthesis of lumbar region 11/23/2013   MRI Lumbar spine    Past Surgical History:  Procedure Laterality Date   APPENDECTOMY  1951   COLONOSCOPY     COLONOSCOPY WITH PROPOFOL N/A 11/15/2015   Procedure: COLONOSCOPY WITH PROPOFOL;  Surgeon: Hulen Luster, MD;  Location: [facility] ENDOSCOPY;  Service: Gastroenterology;  Laterality: N/A;   CORONARY ARTERY BYPASS GRAFT   2006   3 vessel, s/p AMI   IR PERC CHOLECYSTOSTOMY  12/27/2022   NASAL SEPTOPLASTY W/ TURBINOPLASTY  1981   TONSILLECTOMY      Family History  Problem Relation Age of Onset   Hyperlipidemia Mother    Hypertension Mother    Hypertension Father     Social History:  reports that he quit smoking about 59 years ago. His smoking use included cigarettes. He has never used smokeless tobacco. He reports that he does not drink alcohol and does not use drugs.  Allergies:  Allergies  Allergen Reactions   Sulfa Antibiotics Rash    Medications reviewed.    ROS Full ROS performed and is otherwise negative other than what is stated in HPI   BP (!) 148/80   Pulse 93   Temp 98.1 F (36.7 C) (Oral)   Ht '5\' 10"'$  (1.778 m)   Wt 180 lb (81.6 kg)   SpO2 98%   BMI 25.83 kg/m   Physical Exam Vitals and nursing note reviewed. Exam conducted with a chaperone present.  Constitutional:      General: He is not in acute distress.    Appearance: Normal appearance.     Comments: Chronically ill elderly male in no acute distress  Cardiovascular:     Rate and Rhythm: Regular rhythm.     Heart sounds: No murmur heard.    No friction rub.  Pulmonary:  Effort: Pulmonary effort is normal. No respiratory distress.     Breath sounds: Normal breath sounds. No stridor.  Abdominal:     General: There is no distension.     Palpations: Abdomen is soft. There is no mass.     Tenderness: There is no abdominal tenderness.     Hernia: No hernia is present.     Comments: Cholecystostomy tube in place with golden bile  Musculoskeletal:        General: Swelling present. No tenderness. Normal range of motion.     Cervical back: Normal range of motion and neck supple.     Comments: Does have pitting edema all the way to the knees  Skin:    General: Skin is warm and dry.     Capillary Refill: Capillary refill takes less than 2 seconds.  Neurological:     General: No focal deficit present.     Mental  Status: He is alert and oriented to person, place, and time.  Psychiatric:        Mood and Affect: Mood normal.        Behavior: Behavior normal.        Thought Content: Thought content normal.        Judgment: Judgment normal.     Assessment/Plan: 89 year old male with prior history of cholecystitis.  He does have significant comorbidities to include coronary artery disease, CHF, hypertension and prior stroke.  I had an extensive discussion with both the patient and son.  The patient first of all is pristine from his mind perspective although his body does show significant wear and tear.  He is very clear that he wishes to proceed with surgical intervention as if he does not want to live with his cholecystostomy tube and do not want to have any further chances of potential complications related to his GB.  He is fully aware that he is at high risk because of his age and the fact that there is significant inflammation.  I am not in a rush.  I will like to obtain a cholangiogram to make sure that the catheter is working patent and that there is no surprises.  I will see him back in about 3 to 4 weeks once he complete the cholangiogram and at that time we will plan to schedule him for elective cholecystectomy.  They do understand that there is significant associated complications at his age and they are willing to accept the risk Please  note that I spent greater than 40 minutes in this encounter including personally reviewing imaging studies, coordinating his care, placing orders and performing appropriate documentation.    Caroleen Hamman, MD [facility] General Surgeon

## 2023-02-01 NOTE — Telephone Encounter (Signed)
Cholangiogram scheduled 02/28/23 @ 11:15 am.  Dr.Pabon 03/05/23 @ 10 am. Patient verbalized understanding.

## 2023-02-02 NOTE — Telephone Encounter (Signed)
This encounter was created in error - please disregard.

## 2023-02-03 ENCOUNTER — Other Ambulatory Visit: Payer: Self-pay | Admitting: Internal Medicine

## 2023-02-07 DIAGNOSIS — Z4803 Encounter for change or removal of drains: Secondary | ICD-10-CM | POA: Diagnosis not present

## 2023-02-07 DIAGNOSIS — K5900 Constipation, unspecified: Secondary | ICD-10-CM | POA: Diagnosis not present

## 2023-02-07 DIAGNOSIS — E785 Hyperlipidemia, unspecified: Secondary | ICD-10-CM | POA: Diagnosis not present

## 2023-02-07 DIAGNOSIS — I739 Peripheral vascular disease, unspecified: Secondary | ICD-10-CM | POA: Diagnosis not present

## 2023-02-07 DIAGNOSIS — K810 Acute cholecystitis: Secondary | ICD-10-CM | POA: Diagnosis not present

## 2023-02-07 DIAGNOSIS — I5032 Chronic diastolic (congestive) heart failure: Secondary | ICD-10-CM | POA: Diagnosis not present

## 2023-02-07 DIAGNOSIS — R609 Edema, unspecified: Secondary | ICD-10-CM | POA: Diagnosis not present

## 2023-02-07 DIAGNOSIS — G4733 Obstructive sleep apnea (adult) (pediatric): Secondary | ICD-10-CM | POA: Diagnosis not present

## 2023-02-07 DIAGNOSIS — A419 Sepsis, unspecified organism: Secondary | ICD-10-CM | POA: Diagnosis not present

## 2023-02-07 DIAGNOSIS — R42 Dizziness and giddiness: Secondary | ICD-10-CM | POA: Diagnosis not present

## 2023-02-07 DIAGNOSIS — K8020 Calculus of gallbladder without cholecystitis without obstruction: Secondary | ICD-10-CM | POA: Diagnosis not present

## 2023-02-07 DIAGNOSIS — I6523 Occlusion and stenosis of bilateral carotid arteries: Secondary | ICD-10-CM | POA: Diagnosis not present

## 2023-02-07 DIAGNOSIS — G8929 Other chronic pain: Secondary | ICD-10-CM | POA: Diagnosis not present

## 2023-02-07 DIAGNOSIS — R1031 Right lower quadrant pain: Secondary | ICD-10-CM | POA: Diagnosis not present

## 2023-02-07 DIAGNOSIS — J449 Chronic obstructive pulmonary disease, unspecified: Secondary | ICD-10-CM | POA: Diagnosis not present

## 2023-02-07 DIAGNOSIS — I6789 Other cerebrovascular disease: Secondary | ICD-10-CM | POA: Diagnosis not present

## 2023-02-07 DIAGNOSIS — R2681 Unsteadiness on feet: Secondary | ICD-10-CM | POA: Diagnosis not present

## 2023-02-07 DIAGNOSIS — N3281 Overactive bladder: Secondary | ICD-10-CM | POA: Diagnosis not present

## 2023-02-07 DIAGNOSIS — M40202 Unspecified kyphosis, cervical region: Secondary | ICD-10-CM | POA: Diagnosis not present

## 2023-02-07 DIAGNOSIS — R197 Diarrhea, unspecified: Secondary | ICD-10-CM | POA: Diagnosis not present

## 2023-02-07 DIAGNOSIS — I110 Hypertensive heart disease with heart failure: Secondary | ICD-10-CM | POA: Diagnosis not present

## 2023-02-08 DIAGNOSIS — I6523 Occlusion and stenosis of bilateral carotid arteries: Secondary | ICD-10-CM | POA: Diagnosis not present

## 2023-02-08 DIAGNOSIS — R609 Edema, unspecified: Secondary | ICD-10-CM | POA: Diagnosis not present

## 2023-02-08 DIAGNOSIS — K810 Acute cholecystitis: Secondary | ICD-10-CM | POA: Diagnosis not present

## 2023-02-08 DIAGNOSIS — I739 Peripheral vascular disease, unspecified: Secondary | ICD-10-CM | POA: Diagnosis not present

## 2023-02-08 DIAGNOSIS — R1031 Right lower quadrant pain: Secondary | ICD-10-CM | POA: Diagnosis not present

## 2023-02-08 DIAGNOSIS — M40202 Unspecified kyphosis, cervical region: Secondary | ICD-10-CM | POA: Diagnosis not present

## 2023-02-08 DIAGNOSIS — G8929 Other chronic pain: Secondary | ICD-10-CM | POA: Diagnosis not present

## 2023-02-08 DIAGNOSIS — R42 Dizziness and giddiness: Secondary | ICD-10-CM | POA: Diagnosis not present

## 2023-02-08 DIAGNOSIS — I5032 Chronic diastolic (congestive) heart failure: Secondary | ICD-10-CM | POA: Diagnosis not present

## 2023-02-08 DIAGNOSIS — R2681 Unsteadiness on feet: Secondary | ICD-10-CM | POA: Diagnosis not present

## 2023-02-08 DIAGNOSIS — K5900 Constipation, unspecified: Secondary | ICD-10-CM | POA: Diagnosis not present

## 2023-02-08 DIAGNOSIS — A419 Sepsis, unspecified organism: Secondary | ICD-10-CM | POA: Diagnosis not present

## 2023-02-08 DIAGNOSIS — N3281 Overactive bladder: Secondary | ICD-10-CM | POA: Diagnosis not present

## 2023-02-08 DIAGNOSIS — J449 Chronic obstructive pulmonary disease, unspecified: Secondary | ICD-10-CM | POA: Diagnosis not present

## 2023-02-08 DIAGNOSIS — E785 Hyperlipidemia, unspecified: Secondary | ICD-10-CM | POA: Diagnosis not present

## 2023-02-08 DIAGNOSIS — I110 Hypertensive heart disease with heart failure: Secondary | ICD-10-CM | POA: Diagnosis not present

## 2023-02-08 DIAGNOSIS — I6789 Other cerebrovascular disease: Secondary | ICD-10-CM | POA: Diagnosis not present

## 2023-02-08 DIAGNOSIS — Z4803 Encounter for change or removal of drains: Secondary | ICD-10-CM | POA: Diagnosis not present

## 2023-02-08 DIAGNOSIS — R197 Diarrhea, unspecified: Secondary | ICD-10-CM | POA: Diagnosis not present

## 2023-02-08 DIAGNOSIS — G4733 Obstructive sleep apnea (adult) (pediatric): Secondary | ICD-10-CM | POA: Diagnosis not present

## 2023-02-08 DIAGNOSIS — K8020 Calculus of gallbladder without cholecystitis without obstruction: Secondary | ICD-10-CM | POA: Diagnosis not present

## 2023-02-15 DIAGNOSIS — M4316 Spondylolisthesis, lumbar region: Secondary | ICD-10-CM | POA: Diagnosis not present

## 2023-02-15 DIAGNOSIS — I110 Hypertensive heart disease with heart failure: Secondary | ICD-10-CM | POA: Diagnosis not present

## 2023-02-15 DIAGNOSIS — I739 Peripheral vascular disease, unspecified: Secondary | ICD-10-CM | POA: Diagnosis not present

## 2023-02-15 DIAGNOSIS — K5900 Constipation, unspecified: Secondary | ICD-10-CM | POA: Diagnosis not present

## 2023-02-15 DIAGNOSIS — I6523 Occlusion and stenosis of bilateral carotid arteries: Secondary | ICD-10-CM | POA: Diagnosis not present

## 2023-02-15 DIAGNOSIS — I6789 Other cerebrovascular disease: Secondary | ICD-10-CM | POA: Diagnosis not present

## 2023-02-15 DIAGNOSIS — K810 Acute cholecystitis: Secondary | ICD-10-CM | POA: Diagnosis not present

## 2023-02-15 DIAGNOSIS — N3281 Overactive bladder: Secondary | ICD-10-CM | POA: Diagnosis not present

## 2023-02-15 DIAGNOSIS — K219 Gastro-esophageal reflux disease without esophagitis: Secondary | ICD-10-CM | POA: Diagnosis not present

## 2023-02-15 DIAGNOSIS — I5032 Chronic diastolic (congestive) heart failure: Secondary | ICD-10-CM | POA: Diagnosis not present

## 2023-02-15 DIAGNOSIS — G4733 Obstructive sleep apnea (adult) (pediatric): Secondary | ICD-10-CM | POA: Diagnosis not present

## 2023-02-15 DIAGNOSIS — R2681 Unsteadiness on feet: Secondary | ICD-10-CM | POA: Diagnosis not present

## 2023-02-15 DIAGNOSIS — Z4803 Encounter for change or removal of drains: Secondary | ICD-10-CM | POA: Diagnosis not present

## 2023-02-15 DIAGNOSIS — R42 Dizziness and giddiness: Secondary | ICD-10-CM | POA: Diagnosis not present

## 2023-02-15 DIAGNOSIS — I708 Atherosclerosis of other arteries: Secondary | ICD-10-CM | POA: Diagnosis not present

## 2023-02-15 DIAGNOSIS — M40202 Unspecified kyphosis, cervical region: Secondary | ICD-10-CM | POA: Diagnosis not present

## 2023-02-15 DIAGNOSIS — I2510 Atherosclerotic heart disease of native coronary artery without angina pectoris: Secondary | ICD-10-CM | POA: Diagnosis not present

## 2023-02-15 DIAGNOSIS — K8020 Calculus of gallbladder without cholecystitis without obstruction: Secondary | ICD-10-CM | POA: Diagnosis not present

## 2023-02-15 DIAGNOSIS — E785 Hyperlipidemia, unspecified: Secondary | ICD-10-CM | POA: Diagnosis not present

## 2023-02-15 DIAGNOSIS — R609 Edema, unspecified: Secondary | ICD-10-CM | POA: Diagnosis not present

## 2023-02-15 DIAGNOSIS — J449 Chronic obstructive pulmonary disease, unspecified: Secondary | ICD-10-CM | POA: Diagnosis not present

## 2023-02-20 DIAGNOSIS — G4733 Obstructive sleep apnea (adult) (pediatric): Secondary | ICD-10-CM | POA: Diagnosis not present

## 2023-02-20 DIAGNOSIS — K5900 Constipation, unspecified: Secondary | ICD-10-CM | POA: Diagnosis not present

## 2023-02-20 DIAGNOSIS — I110 Hypertensive heart disease with heart failure: Secondary | ICD-10-CM | POA: Diagnosis not present

## 2023-02-20 DIAGNOSIS — Z4803 Encounter for change or removal of drains: Secondary | ICD-10-CM | POA: Diagnosis not present

## 2023-02-20 DIAGNOSIS — N3281 Overactive bladder: Secondary | ICD-10-CM | POA: Diagnosis not present

## 2023-02-20 DIAGNOSIS — R2681 Unsteadiness on feet: Secondary | ICD-10-CM | POA: Diagnosis not present

## 2023-02-20 DIAGNOSIS — K810 Acute cholecystitis: Secondary | ICD-10-CM | POA: Diagnosis not present

## 2023-02-20 DIAGNOSIS — M4316 Spondylolisthesis, lumbar region: Secondary | ICD-10-CM | POA: Diagnosis not present

## 2023-02-20 DIAGNOSIS — K219 Gastro-esophageal reflux disease without esophagitis: Secondary | ICD-10-CM | POA: Diagnosis not present

## 2023-02-20 DIAGNOSIS — I708 Atherosclerosis of other arteries: Secondary | ICD-10-CM | POA: Diagnosis not present

## 2023-02-20 DIAGNOSIS — I6523 Occlusion and stenosis of bilateral carotid arteries: Secondary | ICD-10-CM | POA: Diagnosis not present

## 2023-02-20 DIAGNOSIS — R42 Dizziness and giddiness: Secondary | ICD-10-CM | POA: Diagnosis not present

## 2023-02-20 DIAGNOSIS — J449 Chronic obstructive pulmonary disease, unspecified: Secondary | ICD-10-CM | POA: Diagnosis not present

## 2023-02-20 DIAGNOSIS — R609 Edema, unspecified: Secondary | ICD-10-CM | POA: Diagnosis not present

## 2023-02-20 DIAGNOSIS — K8020 Calculus of gallbladder without cholecystitis without obstruction: Secondary | ICD-10-CM | POA: Diagnosis not present

## 2023-02-20 DIAGNOSIS — I739 Peripheral vascular disease, unspecified: Secondary | ICD-10-CM | POA: Diagnosis not present

## 2023-02-20 DIAGNOSIS — E785 Hyperlipidemia, unspecified: Secondary | ICD-10-CM | POA: Diagnosis not present

## 2023-02-20 DIAGNOSIS — I5032 Chronic diastolic (congestive) heart failure: Secondary | ICD-10-CM | POA: Diagnosis not present

## 2023-02-20 DIAGNOSIS — M40202 Unspecified kyphosis, cervical region: Secondary | ICD-10-CM | POA: Diagnosis not present

## 2023-02-20 DIAGNOSIS — I6789 Other cerebrovascular disease: Secondary | ICD-10-CM | POA: Diagnosis not present

## 2023-02-20 DIAGNOSIS — I2510 Atherosclerotic heart disease of native coronary artery without angina pectoris: Secondary | ICD-10-CM | POA: Diagnosis not present

## 2023-02-23 ENCOUNTER — Other Ambulatory Visit: Payer: Self-pay | Admitting: Internal Medicine

## 2023-02-23 DIAGNOSIS — K219 Gastro-esophageal reflux disease without esophagitis: Secondary | ICD-10-CM | POA: Diagnosis not present

## 2023-02-23 DIAGNOSIS — M40202 Unspecified kyphosis, cervical region: Secondary | ICD-10-CM | POA: Diagnosis not present

## 2023-02-23 DIAGNOSIS — K5900 Constipation, unspecified: Secondary | ICD-10-CM | POA: Diagnosis not present

## 2023-02-23 DIAGNOSIS — K810 Acute cholecystitis: Secondary | ICD-10-CM | POA: Diagnosis not present

## 2023-02-23 DIAGNOSIS — K8020 Calculus of gallbladder without cholecystitis without obstruction: Secondary | ICD-10-CM | POA: Diagnosis not present

## 2023-02-23 DIAGNOSIS — R2681 Unsteadiness on feet: Secondary | ICD-10-CM | POA: Diagnosis not present

## 2023-02-23 DIAGNOSIS — I110 Hypertensive heart disease with heart failure: Secondary | ICD-10-CM | POA: Diagnosis not present

## 2023-02-23 DIAGNOSIS — E785 Hyperlipidemia, unspecified: Secondary | ICD-10-CM | POA: Diagnosis not present

## 2023-02-23 DIAGNOSIS — I5032 Chronic diastolic (congestive) heart failure: Secondary | ICD-10-CM | POA: Diagnosis not present

## 2023-02-23 DIAGNOSIS — G4733 Obstructive sleep apnea (adult) (pediatric): Secondary | ICD-10-CM | POA: Diagnosis not present

## 2023-02-23 DIAGNOSIS — Z4803 Encounter for change or removal of drains: Secondary | ICD-10-CM | POA: Diagnosis not present

## 2023-02-23 DIAGNOSIS — M4316 Spondylolisthesis, lumbar region: Secondary | ICD-10-CM | POA: Diagnosis not present

## 2023-02-23 DIAGNOSIS — I2510 Atherosclerotic heart disease of native coronary artery without angina pectoris: Secondary | ICD-10-CM | POA: Diagnosis not present

## 2023-02-23 DIAGNOSIS — J449 Chronic obstructive pulmonary disease, unspecified: Secondary | ICD-10-CM | POA: Diagnosis not present

## 2023-02-23 DIAGNOSIS — I6523 Occlusion and stenosis of bilateral carotid arteries: Secondary | ICD-10-CM | POA: Diagnosis not present

## 2023-02-23 DIAGNOSIS — I6789 Other cerebrovascular disease: Secondary | ICD-10-CM | POA: Diagnosis not present

## 2023-02-23 DIAGNOSIS — I739 Peripheral vascular disease, unspecified: Secondary | ICD-10-CM | POA: Diagnosis not present

## 2023-02-23 DIAGNOSIS — R42 Dizziness and giddiness: Secondary | ICD-10-CM | POA: Diagnosis not present

## 2023-02-23 DIAGNOSIS — N3281 Overactive bladder: Secondary | ICD-10-CM | POA: Diagnosis not present

## 2023-02-23 DIAGNOSIS — I708 Atherosclerosis of other arteries: Secondary | ICD-10-CM | POA: Diagnosis not present

## 2023-02-23 DIAGNOSIS — R609 Edema, unspecified: Secondary | ICD-10-CM | POA: Diagnosis not present

## 2023-02-26 DIAGNOSIS — K219 Gastro-esophageal reflux disease without esophagitis: Secondary | ICD-10-CM | POA: Diagnosis not present

## 2023-02-26 DIAGNOSIS — Z4803 Encounter for change or removal of drains: Secondary | ICD-10-CM | POA: Diagnosis not present

## 2023-02-26 DIAGNOSIS — I2510 Atherosclerotic heart disease of native coronary artery without angina pectoris: Secondary | ICD-10-CM | POA: Diagnosis not present

## 2023-02-26 DIAGNOSIS — I6523 Occlusion and stenosis of bilateral carotid arteries: Secondary | ICD-10-CM | POA: Diagnosis not present

## 2023-02-26 DIAGNOSIS — I5032 Chronic diastolic (congestive) heart failure: Secondary | ICD-10-CM | POA: Diagnosis not present

## 2023-02-26 DIAGNOSIS — E785 Hyperlipidemia, unspecified: Secondary | ICD-10-CM | POA: Diagnosis not present

## 2023-02-26 DIAGNOSIS — G4733 Obstructive sleep apnea (adult) (pediatric): Secondary | ICD-10-CM | POA: Diagnosis not present

## 2023-02-26 DIAGNOSIS — K5900 Constipation, unspecified: Secondary | ICD-10-CM | POA: Diagnosis not present

## 2023-02-26 DIAGNOSIS — J449 Chronic obstructive pulmonary disease, unspecified: Secondary | ICD-10-CM | POA: Diagnosis not present

## 2023-02-26 DIAGNOSIS — I110 Hypertensive heart disease with heart failure: Secondary | ICD-10-CM | POA: Diagnosis not present

## 2023-02-26 DIAGNOSIS — M40202 Unspecified kyphosis, cervical region: Secondary | ICD-10-CM | POA: Diagnosis not present

## 2023-02-26 DIAGNOSIS — K8020 Calculus of gallbladder without cholecystitis without obstruction: Secondary | ICD-10-CM | POA: Diagnosis not present

## 2023-02-26 DIAGNOSIS — R42 Dizziness and giddiness: Secondary | ICD-10-CM | POA: Diagnosis not present

## 2023-02-26 DIAGNOSIS — N3281 Overactive bladder: Secondary | ICD-10-CM | POA: Diagnosis not present

## 2023-02-26 DIAGNOSIS — K810 Acute cholecystitis: Secondary | ICD-10-CM | POA: Diagnosis not present

## 2023-02-26 DIAGNOSIS — I6789 Other cerebrovascular disease: Secondary | ICD-10-CM | POA: Diagnosis not present

## 2023-02-26 DIAGNOSIS — I708 Atherosclerosis of other arteries: Secondary | ICD-10-CM | POA: Diagnosis not present

## 2023-02-26 DIAGNOSIS — R609 Edema, unspecified: Secondary | ICD-10-CM | POA: Diagnosis not present

## 2023-02-26 DIAGNOSIS — M4316 Spondylolisthesis, lumbar region: Secondary | ICD-10-CM | POA: Diagnosis not present

## 2023-02-26 DIAGNOSIS — R2681 Unsteadiness on feet: Secondary | ICD-10-CM | POA: Diagnosis not present

## 2023-02-26 DIAGNOSIS — I739 Peripheral vascular disease, unspecified: Secondary | ICD-10-CM | POA: Diagnosis not present

## 2023-02-27 DIAGNOSIS — M6281 Muscle weakness (generalized): Secondary | ICD-10-CM | POA: Diagnosis not present

## 2023-02-27 DIAGNOSIS — G4733 Obstructive sleep apnea (adult) (pediatric): Secondary | ICD-10-CM | POA: Diagnosis not present

## 2023-02-27 DIAGNOSIS — I10 Essential (primary) hypertension: Secondary | ICD-10-CM | POA: Diagnosis not present

## 2023-02-28 ENCOUNTER — Ambulatory Visit
Admission: RE | Admit: 2023-02-28 | Discharge: 2023-02-28 | Disposition: A | Discharge status: Home / Self Care | Payer: Medicare Other | Source: Ambulatory Visit | Attending: Surgery | Admitting: Surgery

## 2023-02-28 DIAGNOSIS — K219 Gastro-esophageal reflux disease without esophagitis: Secondary | ICD-10-CM

## 2023-02-28 DIAGNOSIS — K5900 Constipation, unspecified: Secondary | ICD-10-CM

## 2023-02-28 DIAGNOSIS — Z95828 Presence of other vascular implants and grafts: Secondary | ICD-10-CM

## 2023-02-28 DIAGNOSIS — I708 Atherosclerosis of other arteries: Secondary | ICD-10-CM

## 2023-02-28 DIAGNOSIS — I6789 Other cerebrovascular disease: Secondary | ICD-10-CM | POA: Diagnosis not present

## 2023-02-28 DIAGNOSIS — M40202 Unspecified kyphosis, cervical region: Secondary | ICD-10-CM

## 2023-02-28 DIAGNOSIS — Z8673 Personal history of transient ischemic attack (TIA), and cerebral infarction without residual deficits: Secondary | ICD-10-CM

## 2023-02-28 DIAGNOSIS — I110 Hypertensive heart disease with heart failure: Secondary | ICD-10-CM | POA: Diagnosis not present

## 2023-02-28 DIAGNOSIS — E663 Overweight: Secondary | ICD-10-CM

## 2023-02-28 DIAGNOSIS — Z792 Long term (current) use of antibiotics: Secondary | ICD-10-CM

## 2023-02-28 DIAGNOSIS — I5032 Chronic diastolic (congestive) heart failure: Secondary | ICD-10-CM | POA: Diagnosis not present

## 2023-02-28 DIAGNOSIS — K831 Obstruction of bile duct: Secondary | ICD-10-CM | POA: Diagnosis not present

## 2023-02-28 DIAGNOSIS — K8000 Calculus of gallbladder with acute cholecystitis without obstruction: Secondary | ICD-10-CM

## 2023-02-28 DIAGNOSIS — E785 Hyperlipidemia, unspecified: Secondary | ICD-10-CM

## 2023-02-28 DIAGNOSIS — J449 Chronic obstructive pulmonary disease, unspecified: Secondary | ICD-10-CM

## 2023-02-28 DIAGNOSIS — N3281 Overactive bladder: Secondary | ICD-10-CM

## 2023-02-28 DIAGNOSIS — R609 Edema, unspecified: Secondary | ICD-10-CM

## 2023-02-28 DIAGNOSIS — G40909 Epilepsy, unspecified, not intractable, without status epilepticus: Secondary | ICD-10-CM

## 2023-02-28 DIAGNOSIS — I739 Peripheral vascular disease, unspecified: Secondary | ICD-10-CM

## 2023-02-28 DIAGNOSIS — R1314 Dysphagia, pharyngoesophageal phase: Secondary | ICD-10-CM

## 2023-02-28 DIAGNOSIS — I6523 Occlusion and stenosis of bilateral carotid arteries: Secondary | ICD-10-CM

## 2023-02-28 DIAGNOSIS — Z4803 Encounter for change or removal of drains: Secondary | ICD-10-CM | POA: Diagnosis not present

## 2023-02-28 DIAGNOSIS — R2681 Unsteadiness on feet: Secondary | ICD-10-CM

## 2023-02-28 DIAGNOSIS — I2510 Atherosclerotic heart disease of native coronary artery without angina pectoris: Secondary | ICD-10-CM

## 2023-02-28 DIAGNOSIS — G4733 Obstructive sleep apnea (adult) (pediatric): Secondary | ICD-10-CM

## 2023-02-28 DIAGNOSIS — Z7982 Long term (current) use of aspirin: Secondary | ICD-10-CM

## 2023-02-28 DIAGNOSIS — M4316 Spondylolisthesis, lumbar region: Secondary | ICD-10-CM

## 2023-02-28 DIAGNOSIS — K810 Acute cholecystitis: Secondary | ICD-10-CM | POA: Diagnosis not present

## 2023-02-28 DIAGNOSIS — Z9181 History of falling: Secondary | ICD-10-CM

## 2023-02-28 DIAGNOSIS — N401 Enlarged prostate with lower urinary tract symptoms: Secondary | ICD-10-CM

## 2023-02-28 DIAGNOSIS — R42 Dizziness and giddiness: Secondary | ICD-10-CM

## 2023-02-28 DIAGNOSIS — Z951 Presence of aortocoronary bypass graft: Secondary | ICD-10-CM

## 2023-02-28 DIAGNOSIS — I252 Old myocardial infarction: Secondary | ICD-10-CM

## 2023-02-28 DIAGNOSIS — F411 Generalized anxiety disorder: Secondary | ICD-10-CM

## 2023-02-28 DIAGNOSIS — R159 Full incontinence of feces: Secondary | ICD-10-CM

## 2023-02-28 DIAGNOSIS — M48061 Spinal stenosis, lumbar region without neurogenic claudication: Secondary | ICD-10-CM

## 2023-02-28 HISTORY — PX: IR EXCHANGE BILIARY DRAIN: IMG6046

## 2023-02-28 MED ORDER — IOHEXOL 300 MG/ML  SOLN
8.0000 mL | Freq: Once | INTRAMUSCULAR | Status: AC | PRN
  Administered 2023-02-28: 8 mL

## 2023-02-28 MED ORDER — LIDOCAINE HCL 1 % IJ SOLN
INTRAMUSCULAR | Status: AC
  Administered 2023-02-28: 5 mL
  Filled 2023-02-28: qty 20

## 2023-03-01 ENCOUNTER — Ambulatory Visit: Payer: Medicare Other | Admitting: Podiatry

## 2023-03-02 DIAGNOSIS — R2681 Unsteadiness on feet: Secondary | ICD-10-CM | POA: Diagnosis not present

## 2023-03-02 DIAGNOSIS — I5032 Chronic diastolic (congestive) heart failure: Secondary | ICD-10-CM | POA: Diagnosis not present

## 2023-03-02 DIAGNOSIS — Z4803 Encounter for change or removal of drains: Secondary | ICD-10-CM | POA: Diagnosis not present

## 2023-03-02 DIAGNOSIS — I6523 Occlusion and stenosis of bilateral carotid arteries: Secondary | ICD-10-CM | POA: Diagnosis not present

## 2023-03-02 DIAGNOSIS — K5900 Constipation, unspecified: Secondary | ICD-10-CM | POA: Diagnosis not present

## 2023-03-02 DIAGNOSIS — K8020 Calculus of gallbladder without cholecystitis without obstruction: Secondary | ICD-10-CM | POA: Diagnosis not present

## 2023-03-02 DIAGNOSIS — M4316 Spondylolisthesis, lumbar region: Secondary | ICD-10-CM | POA: Diagnosis not present

## 2023-03-02 DIAGNOSIS — I6789 Other cerebrovascular disease: Secondary | ICD-10-CM | POA: Diagnosis not present

## 2023-03-02 DIAGNOSIS — G4733 Obstructive sleep apnea (adult) (pediatric): Secondary | ICD-10-CM | POA: Diagnosis not present

## 2023-03-02 DIAGNOSIS — K810 Acute cholecystitis: Secondary | ICD-10-CM | POA: Diagnosis not present

## 2023-03-02 DIAGNOSIS — R42 Dizziness and giddiness: Secondary | ICD-10-CM | POA: Diagnosis not present

## 2023-03-02 DIAGNOSIS — N3281 Overactive bladder: Secondary | ICD-10-CM | POA: Diagnosis not present

## 2023-03-02 DIAGNOSIS — M40202 Unspecified kyphosis, cervical region: Secondary | ICD-10-CM | POA: Diagnosis not present

## 2023-03-02 DIAGNOSIS — I110 Hypertensive heart disease with heart failure: Secondary | ICD-10-CM | POA: Diagnosis not present

## 2023-03-02 DIAGNOSIS — I2510 Atherosclerotic heart disease of native coronary artery without angina pectoris: Secondary | ICD-10-CM | POA: Diagnosis not present

## 2023-03-02 DIAGNOSIS — R609 Edema, unspecified: Secondary | ICD-10-CM | POA: Diagnosis not present

## 2023-03-02 DIAGNOSIS — J449 Chronic obstructive pulmonary disease, unspecified: Secondary | ICD-10-CM | POA: Diagnosis not present

## 2023-03-02 DIAGNOSIS — I739 Peripheral vascular disease, unspecified: Secondary | ICD-10-CM | POA: Diagnosis not present

## 2023-03-02 DIAGNOSIS — K219 Gastro-esophageal reflux disease without esophagitis: Secondary | ICD-10-CM | POA: Diagnosis not present

## 2023-03-02 DIAGNOSIS — E785 Hyperlipidemia, unspecified: Secondary | ICD-10-CM | POA: Diagnosis not present

## 2023-03-02 DIAGNOSIS — I708 Atherosclerosis of other arteries: Secondary | ICD-10-CM | POA: Diagnosis not present

## 2023-03-05 ENCOUNTER — Ambulatory Visit: Payer: Medicare Other | Admitting: Surgery

## 2023-03-05 ENCOUNTER — Other Ambulatory Visit: Payer: Self-pay

## 2023-03-05 ENCOUNTER — Encounter: Payer: Self-pay | Admitting: Surgery

## 2023-03-05 ENCOUNTER — Telehealth: Payer: Self-pay | Admitting: Surgery

## 2023-03-05 VITALS — BP 155/72 | HR 76 | Temp 97.7°F | Ht 70.0 in | Wt 184.0 lb

## 2023-03-05 DIAGNOSIS — M40202 Unspecified kyphosis, cervical region: Secondary | ICD-10-CM | POA: Diagnosis not present

## 2023-03-05 DIAGNOSIS — E785 Hyperlipidemia, unspecified: Secondary | ICD-10-CM | POA: Diagnosis not present

## 2023-03-05 DIAGNOSIS — I2510 Atherosclerotic heart disease of native coronary artery without angina pectoris: Secondary | ICD-10-CM | POA: Diagnosis not present

## 2023-03-05 DIAGNOSIS — Z4803 Encounter for change or removal of drains: Secondary | ICD-10-CM | POA: Diagnosis not present

## 2023-03-05 DIAGNOSIS — G4733 Obstructive sleep apnea (adult) (pediatric): Secondary | ICD-10-CM | POA: Diagnosis not present

## 2023-03-05 DIAGNOSIS — M4316 Spondylolisthesis, lumbar region: Secondary | ICD-10-CM | POA: Diagnosis not present

## 2023-03-05 DIAGNOSIS — R609 Edema, unspecified: Secondary | ICD-10-CM | POA: Diagnosis not present

## 2023-03-05 DIAGNOSIS — K8020 Calculus of gallbladder without cholecystitis without obstruction: Secondary | ICD-10-CM | POA: Diagnosis not present

## 2023-03-05 DIAGNOSIS — K810 Acute cholecystitis: Secondary | ICD-10-CM | POA: Diagnosis not present

## 2023-03-05 DIAGNOSIS — R42 Dizziness and giddiness: Secondary | ICD-10-CM | POA: Diagnosis not present

## 2023-03-05 DIAGNOSIS — I110 Hypertensive heart disease with heart failure: Secondary | ICD-10-CM | POA: Diagnosis not present

## 2023-03-05 DIAGNOSIS — K5900 Constipation, unspecified: Secondary | ICD-10-CM | POA: Diagnosis not present

## 2023-03-05 DIAGNOSIS — N3281 Overactive bladder: Secondary | ICD-10-CM | POA: Diagnosis not present

## 2023-03-05 DIAGNOSIS — I739 Peripheral vascular disease, unspecified: Secondary | ICD-10-CM | POA: Diagnosis not present

## 2023-03-05 DIAGNOSIS — I6789 Other cerebrovascular disease: Secondary | ICD-10-CM | POA: Diagnosis not present

## 2023-03-05 DIAGNOSIS — K219 Gastro-esophageal reflux disease without esophagitis: Secondary | ICD-10-CM | POA: Diagnosis not present

## 2023-03-05 DIAGNOSIS — J449 Chronic obstructive pulmonary disease, unspecified: Secondary | ICD-10-CM | POA: Diagnosis not present

## 2023-03-05 DIAGNOSIS — I708 Atherosclerosis of other arteries: Secondary | ICD-10-CM | POA: Diagnosis not present

## 2023-03-05 DIAGNOSIS — I6523 Occlusion and stenosis of bilateral carotid arteries: Secondary | ICD-10-CM | POA: Diagnosis not present

## 2023-03-05 DIAGNOSIS — I5032 Chronic diastolic (congestive) heart failure: Secondary | ICD-10-CM | POA: Diagnosis not present

## 2023-03-05 DIAGNOSIS — R2681 Unsteadiness on feet: Secondary | ICD-10-CM | POA: Diagnosis not present

## 2023-03-05 NOTE — Progress Notes (Signed)
Outpatient Surgical Follow Up  03/05/2023  David Atkins is an 89 y.o. male.   Chief Complaint  Patient presents with   Follow-up    Cholecystostomy drain    HPI:  89 year old male with prior history of cholecystitis.  He does have significant comorbidities to include coronary artery disease, CHF, hypertension and prior stroke.  He underwent cholecystostomy tube 2 months ago and was hospitalized for a few days.   He completed exchange of tube w cholangiogram , still chronic obstruction of cystic duct. Tube still working. No recent abd pain or fevers. He comes w care giver, he still doing well  He is still independent but walks with a walker.  His mind is pristine. He does have significant kyphosis and spinal mobility issues.  He denies any recent cardiac issues and was actually seen by cardiology and they did not find any acute coronary or cardiac issues at this time. He denies any fevers any chills.  He does have some discomfort at the exit drain site.  No evidence of cholangitis no evidence of biliary obstruction   Past Medical History:  Diagnosis Date   3-vessel coronary artery disease 2006   [place]   basal cell    Basal cell carcinoma 07/09/2012   BPH (benign prostatic hyperplasia)    Concussion w loss of consciousness of unsp duration, subs 12/29/2018   GERD (gastroesophageal reflux disease)    Hyperlipidemia    Hypertension    Lumbar stenosis 12/03/2013   MRI - lumbar pain with radiation down to LE   Myocardial infarction 2006   OAB (overactive bladder)    Osteoporosis 2010   by DEXA at [facility]   Sleep apnea    Spondylolisthesis of lumbar region 11/23/2013   MRI Lumbar spine    Past Surgical History:  Procedure Laterality Date   APPENDECTOMY  1951   COLONOSCOPY     COLONOSCOPY WITH PROPOFOL N/A 11/15/2015   Procedure: COLONOSCOPY WITH PROPOFOL;  Surgeon: Hulen Luster, MD;  Location: [facility] ENDOSCOPY;  Service: Gastroenterology;  Laterality: N/A;   CORONARY ARTERY BYPASS  GRAFT  2006   3 vessel, s/p AMI   IR EXCHANGE BILIARY DRAIN  02/28/2023   IR PERC CHOLECYSTOSTOMY  12/27/2022   NASAL SEPTOPLASTY W/ TURBINOPLASTY  1981   TONSILLECTOMY      Family History  Problem Relation Age of Onset   Hyperlipidemia Mother    Hypertension Mother    Hypertension Father     Social History:  reports that he quit smoking about 59 years ago. His smoking use included cigarettes. He has never used smokeless tobacco. He reports that he does not drink alcohol and does not use drugs.  Allergies:  Allergies  Allergen Reactions   Sulfa Antibiotics Rash    Medications reviewed.    ROS Full ROS performed and is otherwise negative other than what is stated in HPI   BP (!) 155/72   Pulse 76   Temp 97.7 F (36.5 C) (Oral)   Ht 5\' 10"  (1.778 m)   Wt 184 lb (83.5 kg)   SpO2 98%   BMI 26.40 kg/m   Physical Exam Vitals and nursing note reviewed. Exam conducted with a chaperone present.  Constitutional:      General: He is not in acute distress.    Appearance: Normal appearance.     Comments: Chronically ill elderly male in no acute distress  Cardiovascular:     Rate and Rhythm: Regular rhythm.     Heart sounds: No  murmur heard.    No friction rub.  Pulmonary:     Effort: Pulmonary effort is normal. No respiratory distress.     Breath sounds: Normal breath sounds. No stridor.  Abdominal:     General: There is no distension.     Palpations: Abdomen is soft. There is no mass.     Tenderness: There is no abdominal tenderness.     Hernia: No hernia is present.     Comments: Cholecystostomy tube in place with golden bile  Musculoskeletal:        General: Swelling present. No tenderness. Normal range of motion.     Cervical back: Normal range of motion and neck supple.     Comments: Does have pitting edema all the way to the knees  Skin:    General: Skin is warm and dry.     Capillary Refill: Capillary refill takes less than 2 seconds.  Neurological:      General: No focal deficit present.     Mental Status: He is alert and oriented to person, place, and time.  Psychiatric:        Mood and Affect: Mood normal.        Behavior: Behavior normal.        Thought Content: Thought content normal.        Judgment: Judgment normal.    Assessment/Plan: 89 year old male with prior history of cholecystitis.  He does have significant comorbidities to include coronary artery disease, CHF, hypertension and prior stroke. Again  I had a discussion with  the patient .   He is very clear that he wishes to proceed with surgical intervention as if he does not want to live with his cholecystostomy tube and do not want to have any further chances of potential complications related to his GB.  He is fully aware that he is at high risk because of his age and the fact that there is significant inflammation.  We will plan to schedule him for robotic cholecystectomy next week .  They do understand that there is significant associated complications at his age and they are willing to accept the risk The risks, benefits, complications, treatment options, and expected outcomes were discussed with the patient. The possibilities of bleeding, recurrent infection, finding a normal gallbladder, perforation of viscus organs, damage to surrounding structures, bile leak, abscess formation, needing a drain placed, the need for additional procedures, reaction to medication, pulmonary aspiration,  failure to diagnose a condition, the possible need to convert to an open procedure, and creating a complication requiring transfusion or operation were discussed with the patient. The patient and/or family concurred with the proposed plan, giving informed consent.   I spent  40 minutes in this encounter including personally reviewing imaging studies, coordinating his care, placing orders and performing appropriate documentation.   Caroleen Hamman, MD [facility] General Surgeon

## 2023-03-05 NOTE — H&P (View-Only) (Signed)
Outpatient Surgical Follow Up  03/05/2023  David Atkins is an 89 y.o. male.   Chief Complaint  Patient presents with   Follow-up    Cholecystostomy drain    HPI:  89-year-old male with prior history of cholecystitis.  He does have significant comorbidities to include coronary artery disease, CHF, hypertension and prior stroke.  He underwent cholecystostomy tube 2 months ago and was hospitalized for a few days.   He completed exchange of tube w cholangiogram , still chronic obstruction of cystic duct. Tube still working. No recent abd pain or fevers. He comes w care giver, he still doing well  He is still independent but walks with a walker.  His mind is pristine. He does have significant kyphosis and spinal mobility issues.  He denies any recent cardiac issues and was actually seen by cardiology and they did not find any acute coronary or cardiac issues at this time. He denies any fevers any chills.  He does have some discomfort at the exit drain site.  No evidence of cholangitis no evidence of biliary obstruction   Past Medical History:  Diagnosis Date   3-vessel coronary artery disease 2006   [place]   basal cell    Basal cell carcinoma 07/09/2012   BPH (benign prostatic hyperplasia)    Concussion w loss of consciousness of unsp duration, subs 12/29/2018   GERD (gastroesophageal reflux disease)    Hyperlipidemia    Hypertension    Lumbar stenosis 12/03/2013   MRI - lumbar pain with radiation down to LE   Myocardial infarction 2006   OAB (overactive bladder)    Osteoporosis 2010   [facility]   Sleep apnea    Spondylolisthesis of lumbar region 11/23/2013   MRI Lumbar spine    Past Surgical History:  Procedure Laterality Date   APPENDECTOMY  1951   COLONOSCOPY     COLONOSCOPY WITH PROPOFOL N/A 11/15/2015   Procedure: COLONOSCOPY WITH PROPOFOL;  Surgeon: Rease Y Oh, MD;  Location: [facility] ENDOSCOPY;  Service: Gastroenterology;  Laterality: N/A;   CORONARY ARTERY BYPASS  GRAFT  2006   3 vessel, s/p AMI   IR EXCHANGE BILIARY DRAIN  02/28/2023   IR PERC CHOLECYSTOSTOMY  12/27/2022   NASAL SEPTOPLASTY W/ TURBINOPLASTY  1981   TONSILLECTOMY      Family History  Problem Relation Age of Onset   Hyperlipidemia Mother    Hypertension Mother    Hypertension Father     Social History:  reports that he quit smoking about 59 years ago. His smoking use included cigarettes. He has never used smokeless tobacco. He reports that he does not drink alcohol and does not use drugs.  Allergies:  Allergies  Allergen Reactions   Sulfa Antibiotics Rash    Medications reviewed.    ROS Full ROS performed and is otherwise negative other than what is stated in HPI   BP (!) 155/72   Pulse 76   Temp 97.7 F (36.5 C) (Oral)   Ht 5' 10" (1.778 m)   Wt 184 lb (83.5 kg)   SpO2 98%   BMI 26.40 kg/m   Physical Exam Vitals and nursing note reviewed. Exam conducted with a chaperone present.  Constitutional:      General: He is not in acute distress.    Appearance: Normal appearance.     Comments: Chronically ill elderly male in no acute distress  Cardiovascular:     Rate and Rhythm: Regular rhythm.     Heart sounds: No   murmur heard.    No friction rub.  Pulmonary:     Effort: Pulmonary effort is normal. No respiratory distress.     Breath sounds: Normal breath sounds. No stridor.  Abdominal:     General: There is no distension.     Palpations: Abdomen is soft. There is no mass.     Tenderness: There is no abdominal tenderness.     Hernia: No hernia is present.     Comments: Cholecystostomy tube in place with golden bile  Musculoskeletal:        General: Swelling present. No tenderness. Normal range of motion.     Cervical back: Normal range of motion and neck supple.     Comments: Does have pitting edema all the way to the knees  Skin:    General: Skin is warm and dry.     Capillary Refill: Capillary refill takes less than 2 seconds.  Neurological:      General: No focal deficit present.     Mental Status: He is alert and oriented to person, place, and time.  Psychiatric:        Mood and Affect: Mood normal.        Behavior: Behavior normal.        Thought Content: Thought content normal.        Judgment: Judgment normal.    Assessment/Plan: 89-year-old male with prior history of cholecystitis.  He does have significant comorbidities to include coronary artery disease, CHF, hypertension and prior stroke. Again  I had a discussion with  the patient .   He is very clear that he wishes to proceed with surgical intervention as if he does not want to live with his cholecystostomy tube and do not want to have any further chances of potential complications related to his GB.  He is fully aware that he is at high risk because of his age and the fact that there is significant inflammation.  We will plan to schedule him for robotic cholecystectomy next week .  They do understand that there is significant associated complications at his age and they are willing to accept the risk The risks, benefits, complications, treatment options, and expected outcomes were discussed with the patient. The possibilities of bleeding, recurrent infection, finding a normal gallbladder, perforation of viscus organs, damage to surrounding structures, bile leak, abscess formation, needing a drain placed, the need for additional procedures, reaction to medication, pulmonary aspiration,  failure to diagnose a condition, the possible need to convert to an open procedure, and creating a complication requiring transfusion or operation were discussed with the patient. The patient and/or family concurred with the proposed plan, giving informed consent.   I spent  40 minutes in this encounter including personally reviewing imaging studies, coordinating his care, placing orders and performing appropriate documentation.   Lyris Hitchman, MD FACS General Surgeon 

## 2023-03-05 NOTE — Telephone Encounter (Signed)
Patient has been advised of Pre-Admission date/time, and Surgery date at [facility].  Surgery Date: 03/13/23 Preadmission Testing Date: 03/07/23 (phone 8a-1p)  Patient has been made aware to call [phone], between 1-3:00pm the day before surgery, to find out what time to arrive for surgery.

## 2023-03-05 NOTE — Patient Instructions (Signed)
Our surgery scheduler will call you within 24-48 hours to schedule your surgery. Please have the Blue surgery sheet available when speaking with her.     Minimally Invasive Cholecystectomy Minimally invasive cholecystectomy is surgery to remove the gallbladder. The gallbladder is a pear-shaped organ that lies beneath the liver on the right side of the body. The gallbladder stores bile, which is a fluid that helps the body digest fats. Cholecystectomy is often done to treat inflammation (irritation and swelling) of the gallbladder (cholecystitis). This condition is usually caused by a buildup of gallstones (cholelithiasis) in the gallbladder or when the fluid in the gall bladder becomes stagnant because gallstones get stuck in the ducts (tubes) and block the flow of bile. This can result in inflammation and pain. In severe cases, emergency surgery may be required. This procedure is done through small incisions in the abdomen, instead of one large incision. It is also called laparoscopic surgery. A thin scope with a camera (laparoscope) is inserted through one incision. Then surgical instruments are inserted through the other incisions. In some cases, a minimally invasive surgery may need to be changed to a surgery that is done through a larger incision. This is called open surgery. Tell a health care provider about: Any allergies you have. All medicines you are taking, including vitamins, herbs, eye drops, creams, and over-the-counter medicines. Any problems you or family members have had with anesthetic medicines. Any bleeding problems you have. Any surgeries you have had. Any medical conditions you have. Whether you are pregnant or may be pregnant. What are the risks? Generally, this is a safe procedure. However, problems may occur, including: Infection. Bleeding. Allergic reactions to medicines. Damage to nearby structures or organs. A gallstone remaining in the common bile duct. The common  bile duct carries bile from the gallbladder to the small intestine. A bile leak from the liver or cystic duct after your gallbladder is removed. What happens before the procedure? When to stop eating and drinking Follow instructions from your health care provider about what you may eat and drink before your procedure. These may include: 8 hours before the procedure Stop eating most foods. Do not eat meat, fried foods, or fatty foods. Eat only light foods, such as toast or crackers. All liquids are okay except energy drinks and alcohol. 6 hours before the procedure Stop eating. Drink only clear liquids, such as water, clear fruit juice, black coffee, plain tea, and sports drinks. Do not drink energy drinks or alcohol. 2 hours before the procedure Stop drinking all liquids. You may be allowed to take medicines with small sips of water. If you do not follow your health care provider's instructions, your procedure may be delayed or canceled. Medicines Ask your health care provider about: Changing or stopping your regular medicines. This is especially important if you are taking diabetes medicines or blood thinners. Taking medicines such as aspirin and ibuprofen. These medicines can thin your blood. Do not take these medicines unless your health care provider tells you to take them. Taking over-the-counter medicines, vitamins, herbs, and supplements. General instructions If you will be going home right after the procedure, plan to have a responsible adult: Take you home from the hospital or clinic. You will not be allowed to drive. Care for you for the time you are told. Do not use any products that contain nicotine or tobacco for at least 4 weeks before the procedure. These products include cigarettes, chewing tobacco, and vaping devices, such as e-cigarettes. If you   need help quitting, ask your health care provider. Ask your health care provider: How your surgery site will be marked. What  steps will be taken to help prevent infection. These may include: Removing hair at the surgery site. Washing skin with a germ-killing soap. Taking antibiotic medicine. What happens during the procedure?  An IV will be inserted into one of your veins. You will be given one or both of the following: A medicine to help you relax (sedative). A medicine to make you fall asleep (general anesthetic). Your surgeon will make several small incisions in your abdomen. The laparoscope will be inserted through one of the small incisions. The camera on the laparoscope will send images to a monitor in the operating room. This lets your surgeon see inside your abdomen. A gas will be pumped into your abdomen. This will expand your abdomen to give the surgeon more room to perform the surgery. Other tools that are needed for the procedure will be inserted through the other incisions. The gallbladder will be removed through one of the incisions. Your common bile duct may be examined. If stones are found in the common bile duct, they may be removed. After your gallbladder has been removed, the incisions will be closed with stitches (sutures), staples, or skin glue. Your incisions will be covered with a bandage (dressing). The procedure may vary among health care providers and hospitals. What happens after the procedure? Your blood pressure, heart rate, breathing rate, and blood oxygen level will be monitored until you leave the hospital or clinic. You will be given medicines as needed to control your pain. You may have a drain placed in the incision. The drain will be removed a day or two after the procedure. Summary Minimally invasive cholecystectomy, also called laparoscopic cholecystectomy, is surgery to remove the gallbladder using small incisions. Tell your health care provider about all the medical conditions you have and all the medicines you are taking for those conditions. Before the procedure, follow  instructions about when to stop eating and drinking and changing or stopping medicines. Plan to have a responsible adult care for you for the time you are told after you leave the hospital or clinic. This information is not intended to replace advice given to you by your health care provider. Make sure you discuss any questions you have with your health care provider. Document Revised: 05/24/2021 Document Reviewed: 05/24/2021 [employer] Patient Education  2023 [employer]  

## 2023-03-07 ENCOUNTER — Encounter: Payer: Self-pay | Admitting: Surgery

## 2023-03-07 ENCOUNTER — Other Ambulatory Visit: Payer: Self-pay | Admitting: Internal Medicine

## 2023-03-07 ENCOUNTER — Encounter
Admission: RE | Admit: 2023-03-07 | Discharge: 2023-03-07 | Disposition: A | Discharge status: Home / Self Care | Payer: Medicare Other | Source: Ambulatory Visit | Attending: Surgery | Admitting: Surgery

## 2023-03-07 ENCOUNTER — Telehealth: Payer: Self-pay | Admitting: *Deleted

## 2023-03-07 DIAGNOSIS — I739 Peripheral vascular disease, unspecified: Secondary | ICD-10-CM | POA: Diagnosis not present

## 2023-03-07 DIAGNOSIS — I2510 Atherosclerotic heart disease of native coronary artery without angina pectoris: Secondary | ICD-10-CM | POA: Diagnosis not present

## 2023-03-07 DIAGNOSIS — R42 Dizziness and giddiness: Secondary | ICD-10-CM | POA: Diagnosis not present

## 2023-03-07 DIAGNOSIS — K810 Acute cholecystitis: Secondary | ICD-10-CM | POA: Diagnosis not present

## 2023-03-07 DIAGNOSIS — M40202 Unspecified kyphosis, cervical region: Secondary | ICD-10-CM | POA: Diagnosis not present

## 2023-03-07 DIAGNOSIS — M4316 Spondylolisthesis, lumbar region: Secondary | ICD-10-CM | POA: Diagnosis not present

## 2023-03-07 DIAGNOSIS — K8020 Calculus of gallbladder without cholecystitis without obstruction: Secondary | ICD-10-CM | POA: Diagnosis not present

## 2023-03-07 DIAGNOSIS — G4733 Obstructive sleep apnea (adult) (pediatric): Secondary | ICD-10-CM | POA: Diagnosis not present

## 2023-03-07 DIAGNOSIS — R609 Edema, unspecified: Secondary | ICD-10-CM | POA: Diagnosis not present

## 2023-03-07 DIAGNOSIS — I708 Atherosclerosis of other arteries: Secondary | ICD-10-CM | POA: Diagnosis not present

## 2023-03-07 DIAGNOSIS — I6789 Other cerebrovascular disease: Secondary | ICD-10-CM | POA: Diagnosis not present

## 2023-03-07 DIAGNOSIS — J449 Chronic obstructive pulmonary disease, unspecified: Secondary | ICD-10-CM | POA: Diagnosis not present

## 2023-03-07 DIAGNOSIS — Z4803 Encounter for change or removal of drains: Secondary | ICD-10-CM | POA: Diagnosis not present

## 2023-03-07 DIAGNOSIS — I6523 Occlusion and stenosis of bilateral carotid arteries: Secondary | ICD-10-CM | POA: Diagnosis not present

## 2023-03-07 DIAGNOSIS — Z01812 Encounter for preprocedural laboratory examination: Secondary | ICD-10-CM

## 2023-03-07 DIAGNOSIS — I110 Hypertensive heart disease with heart failure: Secondary | ICD-10-CM | POA: Diagnosis not present

## 2023-03-07 DIAGNOSIS — R2681 Unsteadiness on feet: Secondary | ICD-10-CM | POA: Diagnosis not present

## 2023-03-07 DIAGNOSIS — K5900 Constipation, unspecified: Secondary | ICD-10-CM | POA: Diagnosis not present

## 2023-03-07 DIAGNOSIS — E785 Hyperlipidemia, unspecified: Secondary | ICD-10-CM | POA: Diagnosis not present

## 2023-03-07 DIAGNOSIS — I5032 Chronic diastolic (congestive) heart failure: Secondary | ICD-10-CM | POA: Diagnosis not present

## 2023-03-07 DIAGNOSIS — K219 Gastro-esophageal reflux disease without esophagitis: Secondary | ICD-10-CM | POA: Diagnosis not present

## 2023-03-07 DIAGNOSIS — N3281 Overactive bladder: Secondary | ICD-10-CM | POA: Diagnosis not present

## 2023-03-07 HISTORY — DX: Benign paroxysmal vertigo, unspecified ear: H81.10

## 2023-03-07 HISTORY — DX: Full incontinence of feces: R15.9

## 2023-03-07 HISTORY — DX: Peripheral vascular disease, unspecified: I73.9

## 2023-03-07 HISTORY — DX: Chronic diastolic (congestive) heart failure: I50.32

## 2023-03-07 HISTORY — DX: Calculus of gallbladder without cholecystitis without obstruction: K80.20

## 2023-03-07 HISTORY — DX: Irritable bowel syndrome, unspecified: K58.9

## 2023-03-07 HISTORY — DX: Other abnormalities of gait and mobility: R26.89

## 2023-03-07 HISTORY — DX: Localized edema: R60.0

## 2023-03-07 HISTORY — DX: Acute cholecystitis: K81.0

## 2023-03-07 HISTORY — DX: Occlusion and stenosis of bilateral carotid arteries: I65.23

## 2023-03-07 HISTORY — DX: Presence of aortocoronary bypass graft: Z95.1

## 2023-03-07 HISTORY — DX: Restless legs syndrome: G25.81

## 2023-03-07 HISTORY — DX: Hypokalemia: E87.6

## 2023-03-07 HISTORY — DX: Obstructive sleep apnea (adult) (pediatric): G47.33

## 2023-03-07 HISTORY — DX: Sepsis, unspecified organism: A41.9

## 2023-03-07 HISTORY — DX: Atrioventricular block, first degree: I44.0

## 2023-03-07 HISTORY — DX: Unspecified convulsions: R56.9

## 2023-03-07 HISTORY — DX: Anxiety disorder, unspecified: F41.9

## 2023-03-07 NOTE — Progress Notes (Signed)
Perioperative / Anesthesia Services  Pre-Admission Testing Clinical Review / Preoperative Anesthesia Consult  Date: 03/12/23  Patient Demographics:  Name: David Atkins DOB:   [birth year] MRN:   [id number]  Planned Surgical Procedure(s):    Case: [id number] Date/Time: 03/13/23 0715   Procedures:      XI ROBOTIC ASSISTED LAPAROSCOPIC CHOLECYSTECTOMY     INDOCYANINE GREEN FLUORESCENCE IMAGING (ICG)     HERNIA REPAIR UMBILICAL ADULT   Anesthesia type: General   Pre-op diagnosis:      biliary colic     umbilical hernia 2 cm   Location: [facility] OR ROOM 04 / [facility]   Surgeons: Leafy Ro, MD     NOTE: Available PAT nursing documentation and vital signs have been reviewed. Clinical nursing staff has updated patient's PMH/PSHx, current medication list, and drug allergies/intolerances to ensure comprehensive history available to assist in medical decision making as it pertains to the aforementioned surgical procedure and anticipated anesthetic course. Extensive review of available clinical information personally performed. [place] [facility] and P[facility] updated with any diagnoses/procedures that  may have been inadvertently omitted during his intake with the pre-admission testing department's nursing staff.  Clinical Discussion:  David Atkins is a 89 y.o. male who is submitted for pre-surgical anesthesia review and clearance prior to him undergoing the above procedure. Patient is a Former Smoker (quit 07/1963). Pertinent PMH includes: CAD (s/p CABG), MI, CHF, PSVT, first-degree AV block, CVA, BILATERAL carotid artery stenosis, PAD, lower extremity edema, HTN, HLD, GERD (on daily PPI), umbilical hernia, seizures, OSAH (requires nocturnal PAP therapy), BPPV, gait instability, lumbar spondylolisthesis with stenosis and neurogenic claudication, cholelithiasis, BPH, RLS, anxiety.  Patient is followed by cardiology Mariah Milling, MD). He was last seen in the cardiology clinic on 01/22/2023;  notes reviewed. At the time of his clinic visit, patient doing well overall from a cardiovascular perspective. Lower extremity edema improved with compression stockings. Patient denied any chest pain, shortness of breath, PND, orthopnea, palpitations, weakness, fatigue, vertiginous symptoms, or presyncope/syncope. Patient with a past medical history significant for cardiovascular diagnoses. Documented physical exam was grossly benign, providing no evidence of acute exacerbation and/or decompensation of the patient's known cardiovascular conditions.  Of note, complete records regarding patient's cardiovascular history unavailable at time of consult.  Patient has received care outside of the state of [place]; he received in [place], [place].  Information below gathered from patient/caregiver report and from notes provided by local cardiologist.  Patient reportedly suffered an MI in 2006.  Diagnostic LEFT heart catheterization was performed revealing three-vessel coronary artery disease.  Patient subsequently underwent three-vessel revascularization procedure (bypassed vessels unknown).  Most recent TTE was performed on 09/27/2022 revealing normal left ventricular systolic function with an EF of 60-65%.  There were no regional wall motion abnormalities. Left ventricular diastolic Doppler parameters consistent with abnormal relaxation (G1DD).  Mild mitral valve regurgitation observed. All transvalvular gradients were noted to be normal providing no evidence suggestive of valvular stenosis.  Long-term cardiac event monitor study performed on 11/02/2022 revealing a predominant underlying normal sinus rhythm with an average heart rate of 74 bpm; range 51-179 bpm.  There were 128 runs of SVT, with the fastest interval lasting 4 beats at a maximum rate of 179 bpm, and the longest lasting 17 beats at an average rate of 98 bpm.  There were occasional isolated SVE's accounting for 2.8% of the study (14,782).  SVE couplets (1907)  and triplets (682) were rare.  Patient with rare ventricular ectopy accounting for <1.0%  of the study; isolated VE's = 13,422, VE couplets = 448, and VE triplets = 14.  Ventricular bigeminy and trigeminy present.  Patient with known BILATERAL carotid artery disease.  Serial monitoring of patient's carotid arteries have revealed varying degrees of ICA stenosis.  Most recent carotid duplex performed on 11/24/2022 revealed a 1-39% stenosis of the patient's BILATERAL internal carotid arteries.  MRI imaging of the brain performed on 11/08/2022 demonstrated lacunar infarcts within the bilateral centrum semiovale and left corona radiata.  Patient has no significant neurological deficits resulting from apparent previous neurological events.  Additionally, patient has chronic small vessel cerebrovascular disease with ischemic changes in the cerebral white matter, basal ganglia, and pons.  Recent MRI imaging suggest that noted small vessel ischemic changes are progressive when compared to previous MRI imaging of the brain performed in 2019.  Blood pressure well controlled at 124/56 mmHg on currently prescribed CCB (amlodipine), ACEi (lisinopril), and beta-blocker (metoprolol tartrate) therapies.  Patient is on atorvastatin for his HLD diagnosis and ASCVD prevention.  Patient has a supply of short acting nitrates (NTG) to use on a as needed basis for recurrent angina/anginal equivalent symptoms; denied recent use.  Patient is not diabetic. He does have an OSAH diagnosis and is reported to be compliant with prescribed nocturnal PAP therapy.  Functional capacity limited by patient's age and multiple medical comorbidities. Functional capacity limited by age and medical comorbidities. With that being said, patient felt to be able to complete at least 4 METS of physical activity with no anginal/anginal equivalent symptoms. No changes were made to his medication regimen.  Patient to follow-up with outpatient cardiology in 6  months or sooner if needed.   Kurtiss Wence hospitalized in January 2024 with acute cholecystitis leading to sepsis; notes from admission have been reviewed.  During the course of his admission, patient was treated with parenteral antibiotics and underwent cholecystostomy tube placement via IR.  Patient improved and antimicrobial agents were changed to oral amoxicillin-clavulanate x 14 days.  Patient was ultimately discharged home in stable condition with plans to follow-up with general surgery on an outpatient basis.  Outpatient consult with general surgery (Dr. Sterling Big, MD) on 01/31/2023; notes reviewed.  Discussed potential definitive treatment options.  Patient was deemed high risk given his age, multiple medical comorbidities, and degree of inflammation.  Of note, risk versus benefits of surgery have also been discussed with patient by his cardiologist.  Despite indicated risks, patient remained adamant regarding surgical treatment citing that he doesn't want to have the cholecystostomy tube, and wishes to prevent further issues related to his gallbladder.  In spite of his age, surgeon notes that patient has complete/intact decision making capacity. In keeping with the wishes of his patient, Dr. Everlene Farrier agree to proceed with surgery. Patient has been scheduled for an XI ROBOTIC ASSISTED LAPAROSCOPIC CHOLECYSTECTOMY; UMBILICAL HERNIA REPAIR on 03/13/2023.  Given patient's past medical history significant for cardiovascular diagnoses, presurgical cardiac clearance was sought by the PAT team. Per cardiology, "based ACC/AHA guidelines, the patient's past medical history, and the amount of time since his last clinic visit, this patient would be at an overall MODERATE (due to age) risk for the planned procedure without further cardiovascular testing or intervention at this time".   In review of his medication reconciliation, it is noted that patient is currently on prescribed daily antithrombotic therapy. He  has been instructed on recommendations for holding his daily low-dose ASA for 7 days prior to his procedure with plans to restart as soon  as postoperative bleeding risk felt to be minimized by his attending surgeon. The patient has been instructed that his last dose of his ASA should be on 03/05/2023.  Patient denies previous perioperative complications with anesthesia in the past. In review of the available records, it is noted that patient underwent a MAC anesthetic course at [facility] (ASA III) in 11/2019 without documented complications.      03/05/2023    9:55 AM 01/31/2023    9:20 AM 01/22/2023    3:41 PM  Vitals with BMI  Height     Weight 184 lbs 180 lbs 184 lbs  BMI 26.4 25.83 26.4  Systolic 155 148 161  Diastolic 72 80 56  Pulse 76 93 65    Providers/Specialists:   NOTE: Primary physician provider listed below. Patient may have been seen by APP or partner within same practice.   PROVIDER ROLE / SPECIALTY LAST Cherly Hensen, MD General Surgery (Surgeon) 03/05/2023  Sherlene Shams, MD Primary Care Provider 01/02/2023  Julien Nordmann, MD Cardiology 01/22/2023   Allergies:  Sulfa antibiotics  Current Home Medications:   No current facility-administered medications for this encounter.    acetaminophen (TYLENOL) 500 MG tablet   amLODipine (NORVASC) 2.5 MG tablet   aspirin 81 MG EC tablet   atorvastatin (LIPITOR) 20 MG tablet   Blood Pressure Monitoring (BLOOD PRESSURE MONITOR AUTOMAT) DEVI   cholecalciferol (VITAMIN D) 25 MCG (1000 UNIT) tablet   finasteride (PROSCAR) 5 MG tablet   KLOR-CON M20 20 MEQ tablet   lisinopril (ZESTRIL) 20 MG tablet   Menthol-Camphor (TIGER BALM ARTHRITIS RUB EX)   metoprolol tartrate (LOPRESSOR) 25 MG tablet   nitroGLYCERIN (NITROSTAT) 0.4 MG SL tablet   omeprazole (PRILOSEC) 20 MG capsule   oxybutynin (DITROPAN) 5 MG tablet   polyethylene glycol powder (GLYCOLAX/MIRALAX)  powder   rOPINIRole (REQUIP) 0.25 MG tablet   Simethicone Extra Strength 125 MG CAPS   Syringe/Needle, Disp, (SYRINGE 3CC/25GX1") 25G X 1" 3 ML MISC   tamsulosin (FLOMAX) 0.4 MG CAPS capsule   History:   Past Medical History:  Diagnosis Date   3-vessel coronary artery disease 2006   a.) s/p 3v revascularization (CABG)   Acute cholecystitis    Anxiety    Basal cell carcinoma 07/09/2012   Bilateral carotid artery stenosis    a.) doppler 07/13/2017L < 39% BICA; b.) doppler 11/05/2018: 40-59% RICA, 60-79% LICA; c.) doppler 11/11/2019, 11/16/2020, 11/15/2021: 1-39% RICA; 40-59% LICA; d.) doppler 11/24/2022: 1-39% BICA   BPH (benign prostatic hyperplasia)    BPPV (benign paroxysmal positional vertigo)    Cerebrovascular small vessel disease 01/02/2018   a.) brain MRI 01/02/2018: chronic small vessel ischemic changes within the cerebral white matter, basal ganglia and pons; b.) brain MRI 11/08/2025: progressive small vessel disease since previous MRI   Cholelithiasis    Chronic diastolic (congestive) heart failure    a.) TTE 09/27/2022: EF 60-65%, mild MR, G1DD   Concussion w loss of consciousness of unsp duration, subs 12/29/2018   CVA (cerebral vascular accident)    a.) brain MRI 11/2022: lacunar infarcts within the bilateral centrum semiovale and left corona radiata   First degree heart block    Full incontinence of feces    GERD (gastroesophageal reflux disease)    Hx of CABG    Hyperlipidemia    Hypertension    Hypokalemia    IBS (irritable bowel syndrome)    Lower leg edema  Lumbar stenosis with neurogenic claudication 12/03/2013   Myocardial infarction 2006   a.) Tx'd with 3v revascularization (CABG)   OAB (overactive bladder)    OSA on CPAP    Osteoporosis 2010   by DEXA at [facility]   PAD (peripheral artery disease)    Poor balance    PSVT (paroxysmal supraventricular tachycardia) 11/02/2022   a.) holter 11/02/2022: SVT x 128 episodes --> fastest 4 beats at a max rate of  179 bpm; longested 17 beats at average rate of 98 bpm   RLS (restless legs syndrome)    a.) on ropinirole   S/P CABG x 3 2006   Seizure    Sepsis    Spondylolisthesis of lumbar region 11/23/2013   MRI Lumbar spine   Umbilical hernia    Past Surgical History:  Procedure Laterality Date   APPENDECTOMY  1951   COLONOSCOPY     COLONOSCOPY WITH PROPOFOL N/A 11/15/2015   Procedure: COLONOSCOPY WITH PROPOFOL;  Surgeon: Wallace Cullens, MD;  Location: [facility] ENDOSCOPY;  Service: Gastroenterology;  Laterality: N/A;   CORONARY ARTERY BYPASS GRAFT  2006   3 vessel, s/p AMI   IR EXCHANGE BILIARY DRAIN  02/28/2023   IR PERC CHOLECYSTOSTOMY  12/27/2022   LEFT HEART CATH AND CORONARY ANGIOGRAPHY Left 2006   NASAL SEPTOPLASTY W/ TURBINOPLASTY  1981   TONSILLECTOMY     Family History  Problem Relation Age of Onset   Hyperlipidemia Mother    Hypertension Mother    Hypertension Father    Social History   Tobacco Use   Smoking status: Former    Types: Cigarettes    Quit date: 07/10/1963    Years since quitting: 59.7   Smokeless tobacco: Never  Vaping Use   Vaping Use: Never used  Substance Use Topics   Alcohol use: No   Drug use: No    Pertinent Clinical Results:  LABS:   No visits with results within 3 Day(s) from this visit.  Latest known visit with results is:  Hospital Outpatient Visit on 03/08/2023  Component Date Value Ref Range Status   Sodium 03/08/2023 141  135 - 145 mmol/L Final   Potassium 03/08/2023 3.9  3.5 - 5.1 mmol/L Final   Chloride 03/08/2023 105  98 - 111 mmol/L Final   CO2 03/08/2023 27  22 - 32 mmol/L Final   Glucose, Bld 03/08/2023 81  70 - 99 mg/dL Final   Glucose reference range applies only to samples taken after fasting for at least 8 hours.   BUN 03/08/2023 14  8 - 23 mg/dL Final   Creatinine, Ser 03/08/2023 0.73  0.61 - 1.24 mg/dL Final   Calcium 16/09/9603 8.7 (L)  8.9 - 10.3 mg/dL Final   GFR, Estimated 03/08/2023 >60  >60 mL/min Final   Comment:  (NOTE) Calculated using the CKD-EPI Creatinine Equation (2021)    Anion gap 03/08/2023 9  5 - 15 Final   Performed at [facility], [address] [place], [place] [address]    ECG: Date: 01/22/2023 Time ECG obtained: 1547 PM Rate: 65 bpm Rhythm:  Sinus rhythm with first-degree AV block Axis (leads I and aVF): Normal Intervals: PR 218 ms. QRS 76 ms. QTc 390 ms. ST segment and T wave changes: No evidence of acute ST segment elevation or depression Comparison: Similar to previous tracing obtained on 12/27/2022   IMAGING / PROCEDURES: US ABDOMEN LIMITED RUQ (LIVER/GB) performed on 12/27/2022 Dilated gallbladder with wall thickening and edema.  Gallstones. Findings are worrisome  for developing acute cholecystitis. Please correlate with symptoms.  If needed for confirmation a HIDA scan can be performed as clinically directed.  CT ANGIO CHEST PE W AND/OR WO CONTRAST performed on 12/27/2022 No evidence of pulmonary embolism. Distended gallbladder with 2 radiopaque stones in the neck, wall thickening and pericholecystic fluid. Findings are concerning for acute cholecystitis. Consider right upper quadrant ultrasound for further evaluation. Low lung volumes with bibasilar and dependent atelectasis.  Trace right pleural effusion. Aortic atherosclerosis  VAS [place] ABI WITH/WO TBI performed on 11/15/2022 Resting right ankle-brachial index indicates noncompressible right lower extremity arteries. The right toe-brachial index is normal.  Resting left ankle-brachial index indicates noncompressible left lower extremity arteries. The left toe-brachial index is normal.   VAS US CAROTID performed on 11/24/2022 Velocities in the right ICA are consistent with a 1-39% stenosis. Non-hemodynamically significant plaque <50% noted in the CCA. The ECA appears <50% stenosed. Velocities in the left ICA are consistent with a 1-39% stenosis. Non-hemodynamically significant plaque <50% noted in the CCA.  The ECA appears <50% stenosed. Bilateral vertebral arteries demonstrate antegrade flow.  Normal flow hemodynamics were seen in bilateral subclavian arteries.   LONG TERM CARDIAC EVENT MONITOR STUDY performed on 11/02/2021 Predominant underlying normal sinus rhythm with an average heart rate of 74 bpm; range 51-179 bpm. 128 supraventricular tachycardia runs occurred, the run with the fastest interval lasting 4 beats with a max rate of 179 bpm, the longest lasting 17 beats with an avg rate of 98 bpm.   Isolated SVEs were occasional (2.8%, 40975), SVE Couplets were rare (<1.0%, 1907), and SVE Triplets were rare (<1.0%, 682).  Isolated VEs were rare (<1.0%, 13422), VE Couplets were rare (<1.0%, 448), and VE Triplets were rare (<1.0%, 14).  Ventricular Bigeminy and Trigeminy were present.  Patient triggered events recorded  TRANSTHORACIC ECHOCARDIOGRAM performed on 09/27/2022 Left ventricular ejection fraction, by estimation, is 60 to 65%. The left ventricle has normal function. The left ventricle has no regional wall motion abnormalities. Left ventricular diastolic parameters are consistent with Grade I diastolic dysfunction (impaired relaxation).  Right ventricular systolic function is normal. The right ventricular size is normal.  The mitral valve is grossly normal. Mild mitral valve regurgitation.  The aortic valve was not well visualized. Aortic valve regurgitation is not visualized.   Impression and Plan:  Mindy Verzosa has been referred for pre-anesthesia review and clearance prior to him undergoing the planned anesthetic and procedural courses. Available labs, pertinent testing, and imaging results were personally reviewed by me in preparation for upcoming operative/procedural course. [facility] medical record has been updated following extensive record review and patient interview with PAT staff.   This patient has been appropriately cleared by cardiology with an overall MODERATE risk of  significant perioperative cardiovascular complications. Based on clinical review performed today (03/12/23), barring any significant acute changes in the patient's overall condition, it is anticipated that he will be able to proceed with the planned surgical intervention. Any acute changes in clinical condition may necessitate his procedure being postponed and/or cancelled. Patient will meet with anesthesia team (MD and/or CRNA) on the day of his procedure for preoperative evaluation/assessment. Questions regarding anesthetic course will be fielded at that time.   Pre-surgical instructions were reviewed with the patient during his PAT appointment, and questions were fielded to satisfaction by PAT clinical staff. He has been instructed on which medications that he will need to hold prior to surgery, as well as the ones that have been deemed safe/appropriate to take of  the day of his procedure. As part of the general education provided by PAT, patient made aware both verbally and in writing, that he would need to abstain from the use of any illegal substances during his perioperative course.  He was advised that failure to follow the provided instructions could necessitate case cancellation or result serious perioperative complications up to and including death. Patient encouraged to contact PAT and/or his surgeon's office to discuss any questions or concerns that may arise prior to surgery; verbalized understanding.   Quentin Mulling, MSN, APRN, FNP-C, CEN [facility]  Peri-operative Services Nurse Practitioner Phone: [phone] Fax: [phone] 03/12/23 12:49 PM  NOTE: This note has been prepared using Dragon dictation software. Despite my best ability to proofread, there is always the potential that unintentional transcriptional errors may still occur from this process.

## 2023-03-07 NOTE — Telephone Encounter (Signed)
Good Morning Dr. Rockey Situ,  We have received a surgical clearance request for robotic assisted lap cholecystectomy.  Can you please comment on surgical clearance and guidance on holding ASA 81 mg. Please forward you guidance and recommendations to P CV DIV PREOP   Thanks,  Ambrose Pancoast, NP

## 2023-03-07 NOTE — Patient Instructions (Addendum)
Your procedure is scheduled on:03-13-23 Tuesday Report to the Registration Desk on the 1st floor of the [facility].Then proceed to the 2nd floor Surgery Desk To find out your arrival time, please call [phone] between 1PM - 3PM on:03-12-23 Monday If your arrival time is 6:00 am, do not arrive before that time as the [facility] entrance doors do not open until 6:00 am.  REMEMBER: Instructions that are not followed completely may result in serious medical risk, up to and including death; or upon the discretion of your surgeon and anesthesiologist your surgery may need to be rescheduled.  Do not eat food OR drink any liquids after midnight the night before surgery.  No gum chewing or hard candies.  One week prior to surgery: Stop Anti-inflammatories (NSAIDS) such as Advil, Aleve, Ibuprofen, Motrin, Naproxen, Naprosyn and Aspirin based products such as Excedrin, Goody's Powder, BC Powder.You may however, continue to take Tylenol if needed for pain up until the day of surgery.  Stop ANY OVER THE COUNTER supplements/vitamins NOW (03-07-23) until after surgery (Vitamin D)  Last dose of 81 mg Aspirin was on 03-05-23 as instructed by Dr Dahlia Byes  TAKE ONLY THESE MEDICATIONS THE MORNING OF SURGERY WITH A SIP OF WATER: -finasteride (PROSCAR)  -metoprolol tartrate (LOPRESSOR)  -omeprazole (PRILOSEC)  -oxybutynin (DITROPAN)  -tamsulosin (FLOMAX)   No Alcohol for 24 hours before or after surgery.  No Smoking including e-cigarettes for 24 hours before surgery.  No chewable tobacco products for at least 6 hours before surgery.  No nicotine patches on the day of surgery.  Do not use any "recreational" drugs for at least a week (preferably 2 weeks) before your surgery.  Please be advised that the combination of cocaine and anesthesia may have negative outcomes, up to and including death. If you test positive for cocaine, your surgery will be cancelled.  On the morning of surgery brush your teeth with  toothpaste and water, you may rinse your mouth with mouthwash if you wish. Do not swallow any toothpaste or mouthwash.  Use CHG Soap as directed on instruction sheet.  Do not wear jewelry, make-up, hairpins, clips or nail polish.  Do not wear lotions, powders, or perfumes.   Do not shave body hair from the neck down 48 hours before surgery.  Contact lenses, hearing aids and dentures may not be worn into surgery.  Do not bring valuables to the hospital. [facility] is not responsible for any missing/lost belongings or valuables.   Bring your C-PAP to the hospital  Notify your doctor if there is any change in your medical condition (cold, fever, infection).  Wear comfortable clothing (specific to your surgery type) to the hospital.  After surgery, you can help prevent lung complications by doing breathing exercises.  Take deep breaths and cough every 1-2 hours. Your doctor may order a device called an Incentive Spirometer to help you take deep breaths. When coughing or sneezing, hold a pillow firmly against your incision with both hands. This is called "splinting." Doing this helps protect your incision. It also decreases belly discomfort.  If you are being admitted to the hospital overnight, leave your suitcase in the car. After surgery it may be brought to your room.  In case of increased patient census, it may be necessary for you, the patient, to continue your postoperative care in the Same Day Surgery department.  If you are being discharged the day of surgery, you will not be allowed to drive home. You will need a responsible  individual to drive you home and stay with you for 24 hours after surgery.   If you are taking public transportation, you will need to have a responsible individual with you.  Please call the [place] Dept. at [phone] if you have any questions about these instructions.  Surgery Visitation Policy:  Patients having surgery or a  procedure may have two visitors.  Children under the age of 102 must have an adult with them who is not the patient.     Preparing for Surgery with CHLORHEXIDINE GLUCONATE (CHG) Soap  Chlorhexidine Gluconate (CHG) Soap  o An antiseptic cleaner that kills germs and bonds with the skin to continue killing germs even after washing  o Used for showering the night before surgery and morning of surgery  Before surgery, you can play an important role by reducing the number of germs on your skin.  CHG (Chlorhexidine gluconate) soap is an antiseptic cleanser which kills germs and bonds with the skin to continue killing germs even after washing.  Please do not use if you have an allergy to CHG or antibacterial soaps. If your skin becomes reddened/irritated stop using the CHG.  1. Shower the NIGHT BEFORE SURGERY and the MORNING OF SURGERY with CHG soap.  2. If you choose to wash your hair, wash your hair first as usual with your normal shampoo.  3. After shampooing, rinse your hair and body thoroughly to remove the shampoo.  4. Use CHG as you would any other liquid soap. You can apply CHG directly to the skin and wash gently with a scrungie or a clean washcloth.  5. Apply the CHG soap to your body only from the neck down. Do not use on open wounds or open sores. Avoid contact with your eyes, ears, mouth, and genitals (private parts). Wash face and genitals (private parts) with your normal soap.  6. Wash thoroughly, paying special attention to the area where your surgery will be performed.  7. Thoroughly rinse your body with warm water.  8. Do not shower/wash with your normal soap after using and rinsing off the CHG soap.  9. Pat yourself dry with a clean towel.  10. Wear clean pajamas to bed the night before surgery.  12. Place clean sheets on your bed the night of your first shower and do not sleep with pets.  13. Shower again with the CHG soap on the day of surgery prior to arriving at  the hospital.  14. Do not apply any deodorants/lotions/powders.  15. Please wear clean clothes to the hospital.

## 2023-03-07 NOTE — Telephone Encounter (Signed)
-----   Message from Karen Kitchens, NP sent at 03/07/2023 12:55 PM EDT ----- Regarding: Request for pre-operative cardiac clearance Request for pre-operative cardiac clearance:  1. What type of surgery is being performed?  XI ROBOTIC ASSISTED LAPAROSCOPIC CHOLECYSTECTOMY; UMBILICAL HERNIA REPAIR   2. When is this surgery scheduled?  03/13/2023  3. Type of clearance being requested (medical, pharmacy, both)? MEDICAL   4. Are there any medications that need to be held prior to surgery? ASA  5. Practice name and name of physician performing surgery?  Performing surgeon: Dr. Caroleen Hamman, MD Requesting clearance: Honor Loh, FNP-C    6. Anesthesia type (none, local, MAC, general)? GENERAL  7. What is the office phone and fax number?   Phone: [phone] Fax: [phone]  ATTENTION: Unable to create telephone message as per your standard workflow. Directed by [facility] providers to send requests for cardiac clearance to this pool for appropriate distribution to provider covering pre-operative clearances.   Honor Loh, MSN, APRN, FNP-C, CEN [facility]  Peri-operative Services Nurse Practitioner Phone: [phone] 03/07/23 12:55 PM

## 2023-03-08 ENCOUNTER — Ambulatory Visit: Payer: Self-pay

## 2023-03-08 ENCOUNTER — Encounter
Admission: RE | Admit: 2023-03-08 | Discharge: 2023-03-08 | Disposition: A | Discharge status: Home / Self Care | Payer: Medicare Other | Source: Ambulatory Visit | Attending: Surgery | Admitting: Surgery

## 2023-03-08 DIAGNOSIS — Z01812 Encounter for preprocedural laboratory examination: Secondary | ICD-10-CM | POA: Diagnosis not present

## 2023-03-08 LAB — BASIC METABOLIC PANEL
Anion gap: 9 (ref 5–15)
BUN: 14 mg/dL (ref 8–23)
CO2: 27 mmol/L (ref 22–32)
Calcium: 8.7 mg/dL — ABNORMAL LOW (ref 8.9–10.3)
Chloride: 105 mmol/L (ref 98–111)
Creatinine, Ser: 0.73 mg/dL (ref 0.61–1.24)
GFR, Estimated: >60 mL/min (ref >60)
Glucose, Bld: 81 mg/dL (ref 70–99)
Potassium: 3.9 mmol/L (ref 3.5–5.1)
Sodium: 141 mmol/L (ref 135–145)

## 2023-03-08 NOTE — Patient Outreach (Signed)
  Care Coordination   Follow Up Visit Note   03/08/2023 Name: David Atkins MRN: [id number] DOB: 09-17-1929  David Atkins is a 89 y.o. year old male who sees Derrel Nip, Aris Everts, MD for primary care. I spoke with  David Atkins by phone today.  What matters to the patients health and wellness today?  Patient states he is doing well.  Denies any pain or symptoms related to COPD.  Patient states gallbladder surgery scheduled for 03/13/23 and he will have preadmission testing this afternoon.     Goals Addressed             This Visit's Progress    "Getting in to see general surgeon" and post hospital follow up/ management       Interventions Today    Flowsheet Row Most Recent Value  Chronic Disease   Chronic disease during today's visit Other, Chronic Obstructive Pulmonary Disease (COPD)  [choleycystitis]  General Interventions   General Interventions Discussed/Reviewed General Interventions Reviewed, Doctor Visits  [Evaluation of current treatment plan related to COPD, cholecystitis and patients adherence to plan as recommended by provider.  Assessed patient for COPD symptoms and pain/ symptoms related to cholecystitis. Confirmed upcoming surgical date.]  Doctor Visits Discussed/Reviewed Doctor Visits Reviewed  [reviewed scheduled/ upcoming appointments.  Discussed ongoing follow up with [facility] for care coordination.]  Pharmacy Interventions   Pharmacy Dicussed/Reviewed Pharmacy Topics Reviewed  [Discussed medications and confirmed patient knows what medications to hold prior to surgery per provider advisement.]               SDOH assessments and interventions completed:  No     Care Coordination Interventions:  Yes, provided   Follow up plan: Follow up call scheduled for 03/16/23    Encounter Outcome:  Pt. Visit Completed   Quinn Plowman RN,BSN,CCM [place] [phone] direct line

## 2023-03-09 ENCOUNTER — Encounter: Payer: Self-pay | Admitting: Urgent Care

## 2023-03-09 NOTE — Telephone Encounter (Signed)
Dr. Mariah Milling,  Please review preoperative cardiac clearance request and make recommendations for holding aspirin.  Thank you for your help.  Please forward response to [facility] preop pool.  Thomasene Ripple. Case Vassell NP-C     03/09/2023, 12:23 PM [facility] [address] Office [phone] Fax [phone]

## 2023-03-12 MED ORDER — INDOCYANINE GREEN 25 MG IV SOLR
2.5000 mg | Freq: Once | INTRAVENOUS | Status: AC
  Administered 2023-03-13: 2.5 mg via INTRAVENOUS
  Filled 2023-03-12: qty 1

## 2023-03-12 MED ORDER — CEFAZOLIN SODIUM-DEXTROSE 2-4 GM/100ML-% IV SOLN
2.0000 g | INTRAVENOUS | Status: AC
  Administered 2023-03-13: 2 g via INTRAVENOUS

## 2023-03-12 MED ORDER — ORAL CARE MOUTH RINSE: 15.0000 mL | Freq: Once | OROMUCOSAL | Status: AC

## 2023-03-12 MED ORDER — LACTATED RINGERS IV SOLN: INTRAVENOUS | Status: DC

## 2023-03-12 MED ORDER — CHLORHEXIDINE GLUCONATE CLOTH 2 % EX PADS
6.0000 | MEDICATED_PAD | Freq: Once | CUTANEOUS | Status: AC
  Administered 2023-03-13: 6 via TOPICAL

## 2023-03-12 MED ORDER — CHLORHEXIDINE GLUCONATE CLOTH 2 % EX PADS: 6.0000 | MEDICATED_PAD | Freq: Once | CUTANEOUS | Status: DC

## 2023-03-12 MED ORDER — ACETAMINOPHEN 500 MG PO TABS
1000.0000 mg | ORAL_TABLET | ORAL | Status: AC
  Administered 2023-03-13: 1000 mg via ORAL

## 2023-03-12 MED ORDER — CHLORHEXIDINE GLUCONATE 0.12 % MT SOLN
15.0000 mL | Freq: Once | OROMUCOSAL | Status: AC
  Administered 2023-03-13: 15 mL via OROMUCOSAL

## 2023-03-12 NOTE — Telephone Encounter (Signed)
Addressed by Dr. Mariah Milling.  I will remove patient from preoperative cardiac pool.  He has been given the okay to proceed with surgery.  Thomasene Ripple. Ezariah Nace NP-C     03/12/2023, 12:49 PM [facility] [address] Office [phone] Fax [phone]

## 2023-03-13 ENCOUNTER — Encounter
Admission: RE | Disposition: A | Discharge status: Home / Self Care | Payer: Self-pay | Source: Home / Self Care | Attending: Surgery

## 2023-03-13 ENCOUNTER — Ambulatory Visit: Payer: Medicare Other | Admitting: Urgent Care

## 2023-03-13 ENCOUNTER — Ambulatory Visit: Payer: Medicare Other

## 2023-03-13 ENCOUNTER — Encounter: Payer: Self-pay | Admitting: Surgery

## 2023-03-13 ENCOUNTER — Other Ambulatory Visit: Payer: Self-pay

## 2023-03-13 ENCOUNTER — Ambulatory Visit
Admission: RE | Admit: 2023-03-13 | Discharge: 2023-03-13 | Disposition: A | Discharge status: Home / Self Care | Payer: Medicare Other | Attending: Surgery | Admitting: Surgery

## 2023-03-13 DIAGNOSIS — I5030 Unspecified diastolic (congestive) heart failure: Secondary | ICD-10-CM | POA: Insufficient documentation

## 2023-03-13 DIAGNOSIS — K439 Ventral hernia without obstruction or gangrene: Secondary | ICD-10-CM

## 2023-03-13 DIAGNOSIS — K429 Umbilical hernia without obstruction or gangrene: Secondary | ICD-10-CM | POA: Insufficient documentation

## 2023-03-13 DIAGNOSIS — K8012 Calculus of gallbladder with acute and chronic cholecystitis without obstruction: Secondary | ICD-10-CM | POA: Insufficient documentation

## 2023-03-13 DIAGNOSIS — K812 Acute cholecystitis with chronic cholecystitis: Secondary | ICD-10-CM | POA: Diagnosis present

## 2023-03-13 DIAGNOSIS — K811 Chronic cholecystitis: Secondary | ICD-10-CM | POA: Diagnosis not present

## 2023-03-13 DIAGNOSIS — I252 Old myocardial infarction: Secondary | ICD-10-CM | POA: Diagnosis not present

## 2023-03-13 DIAGNOSIS — Z8673 Personal history of transient ischemic attack (TIA), and cerebral infarction without residual deficits: Secondary | ICD-10-CM | POA: Insufficient documentation

## 2023-03-13 DIAGNOSIS — I110 Hypertensive heart disease with heart failure: Secondary | ICD-10-CM | POA: Diagnosis not present

## 2023-03-13 DIAGNOSIS — I2510 Atherosclerotic heart disease of native coronary artery without angina pectoris: Secondary | ICD-10-CM | POA: Diagnosis not present

## 2023-03-13 DIAGNOSIS — G4730 Sleep apnea, unspecified: Secondary | ICD-10-CM | POA: Insufficient documentation

## 2023-03-13 DIAGNOSIS — K810 Acute cholecystitis: Secondary | ICD-10-CM | POA: Diagnosis present

## 2023-03-13 DIAGNOSIS — I739 Peripheral vascular disease, unspecified: Secondary | ICD-10-CM | POA: Diagnosis not present

## 2023-03-13 DIAGNOSIS — Z951 Presence of aortocoronary bypass graft: Secondary | ICD-10-CM | POA: Diagnosis not present

## 2023-03-13 DIAGNOSIS — Z87891 Personal history of nicotine dependence: Secondary | ICD-10-CM | POA: Diagnosis not present

## 2023-03-13 DIAGNOSIS — K8050 Calculus of bile duct without cholangitis or cholecystitis without obstruction: Secondary | ICD-10-CM | POA: Diagnosis not present

## 2023-03-13 HISTORY — DX: Cerebral infarction, unspecified: I63.9

## 2023-03-13 HISTORY — PX: UMBILICAL HERNIA REPAIR: SHX196

## 2023-03-13 HISTORY — DX: Calculus of gallbladder without cholecystitis without obstruction: K80.20

## 2023-03-13 HISTORY — PX: INTRAOPERATIVE CHOLANGIOGRAM: SHX5230

## 2023-03-13 HISTORY — DX: Umbilical hernia without obstruction or gangrene: K42.9

## 2023-03-13 SURGERY — CHOLECYSTECTOMY, ROBOT-ASSISTED, LAPAROSCOPIC
Anesthesia: General

## 2023-03-13 MED ORDER — ONDANSETRON HCL 4 MG/2ML IJ SOLN
INTRAMUSCULAR | Status: DC | PRN
  Administered 2023-03-13: 4 mg via INTRAVENOUS

## 2023-03-13 MED ORDER — ONDANSETRON HCL 4 MG/2ML IJ SOLN
INTRAMUSCULAR | Status: AC
  Filled 2023-03-13: qty 2

## 2023-03-13 MED ORDER — CHLORHEXIDINE GLUCONATE 0.12 % MT SOLN
OROMUCOSAL | Status: AC
  Filled 2023-03-13: qty 15

## 2023-03-13 MED ORDER — BUPIVACAINE LIPOSOME 1.3 % IJ SUSP
INTRAMUSCULAR | Status: DC | PRN
  Administered 2023-03-13: 20 mL

## 2023-03-13 MED ORDER — OXYCODONE HCL 5 MG PO TABS
ORAL_TABLET | ORAL | Status: AC
  Filled 2023-03-13: qty 1

## 2023-03-13 MED ORDER — ACETAMINOPHEN 10 MG/ML IV SOLN: 1000.0000 mg | Freq: Once | INTRAVENOUS | Status: DC | PRN

## 2023-03-13 MED ORDER — LACTATED RINGERS IV SOLN: INTRAVENOUS | Status: DC

## 2023-03-13 MED ORDER — FENTANYL CITRATE (PF) 100 MCG/2ML IJ SOLN
INTRAMUSCULAR | Status: AC
  Filled 2023-03-13: qty 2

## 2023-03-13 MED ORDER — BUPIVACAINE LIPOSOME 1.3 % IJ SUSP
INTRAMUSCULAR | Status: AC
  Filled 2023-03-13: qty 20

## 2023-03-13 MED ORDER — ACETAMINOPHEN 500 MG PO TABS
ORAL_TABLET | ORAL | Status: AC
  Filled 2023-03-13: qty 2

## 2023-03-13 MED ORDER — SODIUM CHLORIDE 0.9 % IR SOLN
Status: DC | PRN
  Administered 2023-03-13: 1000 mL

## 2023-03-13 MED ORDER — DEXAMETHASONE SODIUM PHOSPHATE 10 MG/ML IJ SOLN
INTRAMUSCULAR | Status: DC | PRN
  Administered 2023-03-13: 10 mg via INTRAVENOUS

## 2023-03-13 MED ORDER — FENTANYL CITRATE (PF) 100 MCG/2ML IJ SOLN: 25.0000 ug | INTRAMUSCULAR | Status: DC | PRN

## 2023-03-13 MED ORDER — ROCURONIUM BROMIDE 10 MG/ML (PF) SYRINGE
PREFILLED_SYRINGE | INTRAVENOUS | Status: AC
  Filled 2023-03-13: qty 10

## 2023-03-13 MED ORDER — PROPOFOL 10 MG/ML IV BOLUS
INTRAVENOUS | Status: DC | PRN
  Administered 2023-03-13: 90 mg via INTRAVENOUS

## 2023-03-13 MED ORDER — FENTANYL CITRATE (PF) 100 MCG/2ML IJ SOLN
INTRAMUSCULAR | Status: DC | PRN
  Administered 2023-03-13 (×3): 50 ug via INTRAVENOUS

## 2023-03-13 MED ORDER — PHENYLEPHRINE HCL-NACL 20-0.9 MG/250ML-% IV SOLN
INTRAVENOUS | Status: DC | PRN
  Administered 2023-03-13: 50 ug/min via INTRAVENOUS

## 2023-03-13 MED ORDER — OXYCODONE HCL 5 MG PO TABS
5.0000 mg | ORAL_TABLET | Freq: Once | ORAL | Status: AC | PRN
  Administered 2023-03-13: 5 mg via ORAL

## 2023-03-13 MED ORDER — ROCURONIUM BROMIDE 100 MG/10ML IV SOLN
INTRAVENOUS | Status: DC | PRN
  Administered 2023-03-13: 20 mg via INTRAVENOUS
  Administered 2023-03-13: 5 mg via INTRAVENOUS

## 2023-03-13 MED ORDER — HYDROCODONE-ACETAMINOPHEN 5-325 MG PO TABS: 1.0000 | ORAL_TABLET | Freq: Four times a day (QID) | ORAL | 0 refills | Status: DC | PRN

## 2023-03-13 MED ORDER — OXYCODONE HCL 5 MG/5ML PO SOLN: 5.0000 mg | Freq: Once | ORAL | Status: AC | PRN

## 2023-03-13 MED ORDER — SUCCINYLCHOLINE CHLORIDE 200 MG/10ML IV SOSY
PREFILLED_SYRINGE | INTRAVENOUS | Status: AC
  Filled 2023-03-13: qty 10

## 2023-03-13 MED ORDER — BUPIVACAINE HCL (PF) 0.25 % IJ SOLN
INTRAMUSCULAR | Status: AC
  Filled 2023-03-13: qty 30

## 2023-03-13 MED ORDER — DEXAMETHASONE SODIUM PHOSPHATE 10 MG/ML IJ SOLN
INTRAMUSCULAR | Status: AC
  Filled 2023-03-13: qty 1

## 2023-03-13 MED ORDER — BUPIVACAINE HCL 0.25 % IJ SOLN
INTRAMUSCULAR | Status: DC | PRN
  Administered 2023-03-13: 30 mL

## 2023-03-13 MED ORDER — CEFAZOLIN SODIUM-DEXTROSE 2-4 GM/100ML-% IV SOLN
INTRAVENOUS | Status: AC
  Filled 2023-03-13: qty 100

## 2023-03-13 MED ORDER — PHENYLEPHRINE HCL (PRESSORS) 10 MG/ML IV SOLN
INTRAVENOUS | Status: AC
  Filled 2023-03-13: qty 1

## 2023-03-13 MED ORDER — ONDANSETRON HCL 4 MG/2ML IJ SOLN: 4.0000 mg | Freq: Once | INTRAMUSCULAR | Status: DC | PRN

## 2023-03-13 MED ORDER — LIDOCAINE HCL (CARDIAC) PF 100 MG/5ML IV SOSY
PREFILLED_SYRINGE | INTRAVENOUS | Status: DC | PRN
  Administered 2023-03-13: 80 mg via INTRAVENOUS

## 2023-03-13 MED ORDER — LIDOCAINE HCL (PF) 2 % IJ SOLN
INTRAMUSCULAR | Status: AC
  Filled 2023-03-13: qty 5

## 2023-03-13 MED ORDER — PROPOFOL 10 MG/ML IV BOLUS
INTRAVENOUS | Status: AC
  Filled 2023-03-13: qty 20

## 2023-03-13 MED ORDER — IOHEXOL 180 MG/ML  SOLN
INTRAMUSCULAR | Status: DC | PRN
  Administered 2023-03-13: 20 mL

## 2023-03-13 MED ORDER — SUGAMMADEX SODIUM 200 MG/2ML IV SOLN
INTRAVENOUS | Status: DC | PRN
  Administered 2023-03-13: 200 mg via INTRAVENOUS

## 2023-03-13 MED ORDER — PHENYLEPHRINE HCL (PRESSORS) 10 MG/ML IV SOLN
INTRAVENOUS | Status: DC | PRN
  Administered 2023-03-13 (×2): 80 ug via INTRAVENOUS
  Administered 2023-03-13: 40 ug via INTRAVENOUS

## 2023-03-13 MED ORDER — SUCCINYLCHOLINE CHLORIDE 200 MG/10ML IV SOSY
PREFILLED_SYRINGE | INTRAVENOUS | Status: DC | PRN
  Administered 2023-03-13: 100 mg via INTRAVENOUS

## 2023-03-13 SURGICAL SUPPLY — 75 items
ADH SKN CLS APL DERMABOND .7 (GAUZE/BANDAGES/DRESSINGS) ×2
APL PRP STRL LF DISP 70% ISPRP (MISCELLANEOUS)
APPLIER CLIP 11 MED OPEN (CLIP)
APR CLP MED 11 20 MLT OPN (CLIP)
BLADE CLIPPER SURG (BLADE) ×2 IMPLANT
BLADE SURG 15 STRL LF DISP TIS (BLADE) ×2 IMPLANT
BLADE SURG 15 STRL SS (BLADE) ×2
BULB RESERV EVAC DRAIN JP 100C (MISCELLANEOUS) IMPLANT
CANNULA REDUC XI 12-8 STAPL (CANNULA) ×2
CANNULA REDUCER 12-8 DVNC XI (CANNULA) ×2 IMPLANT
CATH REDDICK CHOLANGI 4FR 50CM (CATHETERS) IMPLANT
CAUTERY HOOK 1.6MM DA VINCI XI (INSTRUMENTS) ×2
CAUTERY HOOK MNPLR 1.6 DVNC XI (INSTRUMENTS) ×2 IMPLANT
CHLORAPREP W/TINT 26 (MISCELLANEOUS) ×2 IMPLANT
CLIP APPLIE 11 MED OPEN (CLIP) ×2 IMPLANT
CLIP LIGATING HEMO O LOK GREEN (MISCELLANEOUS) ×2 IMPLANT
DERMABOND ADVANCED .7 DNX12 (GAUZE/BANDAGES/DRESSINGS) ×2 IMPLANT
DRAIN CHANNEL JP 19F (MISCELLANEOUS) IMPLANT
DRAPE ARM DVNC X/XI (DISPOSABLE) ×8 IMPLANT
DRAPE COLUMN DVNC XI (DISPOSABLE) ×2 IMPLANT
DRAPE DA VINCI XI ARM (DISPOSABLE) ×8
DRAPE DA VINCI XI COLUMN (DISPOSABLE) ×2
DRAPE INCISE IOBAN 66X45 STRL (DRAPES) ×2 IMPLANT
DRAPE LAPAROTOMY 77X122 PED (DRAPES) ×2 IMPLANT
DRSG TELFA 3X8 NADH STRL (GAUZE/BANDAGES/DRESSINGS) ×2 IMPLANT
ELECT CAUTERY BLADE 6.4 (BLADE) ×2 IMPLANT
ELECT REM PT RETURN 9FT ADLT (ELECTROSURGICAL) ×2
ELECTRODE REM PT RTRN 9FT ADLT (ELECTROSURGICAL) ×2 IMPLANT
FORCEPS BPLR R/ABLATION 8 DVNC (INSTRUMENTS) ×2 IMPLANT
FORCEPS PROGRASP DVNC XI (FORCEP) ×2 IMPLANT
FORCEPS XI PROGRASP DA VINCI (FORCEP) ×2
GAUZE 4X4 16PLY ~~LOC~~+RFID DBL (SPONGE) ×2 IMPLANT
GLOVE BIO SURGEON STRL SZ7 (GLOVE) ×4 IMPLANT
GOWN STRL REUS W/ TWL LRG LVL3 (GOWN DISPOSABLE) ×8 IMPLANT
GOWN STRL REUS W/TWL LRG LVL3 (GOWN DISPOSABLE) ×8
IRRIGATION STRYKERFLOW (MISCELLANEOUS) IMPLANT
IRRIGATOR STRYKERFLOW (MISCELLANEOUS) ×2
IV CATH ANGIO 12GX3 LT BLUE (NEEDLE) IMPLANT
KIT PINK PAD W/HEAD ARE REST (MISCELLANEOUS) ×2
KIT PINK PAD W/HEAD ARM REST (MISCELLANEOUS) ×2 IMPLANT
LABEL OR SOLS (LABEL) ×2 IMPLANT
MANIFOLD NEPTUNE II (INSTRUMENTS) ×2 IMPLANT
NDL HYPO 22X1.5 SAFETY MO (MISCELLANEOUS) ×2 IMPLANT
NEEDLE HYPO 22X1.5 SAFETY MO (MISCELLANEOUS) ×2 IMPLANT
NS IRRIG 500ML POUR BTL (IV SOLUTION) ×2 IMPLANT
OBTURATOR OPTICAL STANDARD 8MM (TROCAR) ×2
OBTURATOR OPTICAL STND 8 DVNC (TROCAR) ×2
OBTURATOR OPTICALSTD 8 DVNC (TROCAR) ×2 IMPLANT
PACK BASIN MINOR ARMC (MISCELLANEOUS) ×2 IMPLANT
PACK LAP CHOLECYSTECTOMY (MISCELLANEOUS) ×2 IMPLANT
PENCIL SMOKE EVACUATOR (MISCELLANEOUS) ×2 IMPLANT
SEAL UNIV 5-12 XI (MISCELLANEOUS) ×8 IMPLANT
SEAL XI UNIVERSAL 5-12 (MISCELLANEOUS) ×8
SET TUBE SMOKE EVAC HIGH FLOW (TUBING) ×2 IMPLANT
SOL ELECTROSURG ANTI STICK (MISCELLANEOUS) ×2
SOLUTION ELECTROSURG ANTI STCK (MISCELLANEOUS) ×2 IMPLANT
SPIKE FLUID TRANSFER (MISCELLANEOUS) ×2 IMPLANT
SPONGE T-LAP 18X18 ~~LOC~~+RFID (SPONGE) ×2 IMPLANT
SPONGE T-LAP 4X18 ~~LOC~~+RFID (SPONGE) IMPLANT
STOPCOCK 3 WAY MALE LL (IV SETS)
STOPCOCK 3WAY MALE LL (IV SETS) IMPLANT
SUT ETHIBOND NAB MO 7 #0 18IN (SUTURE) ×2 IMPLANT
SUT ETHILON 3-0 FS-10 30 BLK (SUTURE) ×2
SUT MNCRL AB 4-0 PS2 18 (SUTURE) ×2 IMPLANT
SUT VIC AB 3-0 SH 27 (SUTURE) ×2
SUT VIC AB 3-0 SH 27X BRD (SUTURE) ×2 IMPLANT
SUT VICRYL 0 UR6 27IN ABS (SUTURE) ×4 IMPLANT
SUTURE EHLN 3-0 FS-10 30 BLK (SUTURE) IMPLANT
SYR 20ML LL LF (SYRINGE) ×2 IMPLANT
SYR 30ML LL (SYRINGE) ×2 IMPLANT
SYS BAG RETRIEVAL 10MM (BASKET) ×2
SYSTEM BAG RETRIEVAL 10MM (BASKET) ×2 IMPLANT
TRAP FLUID SMOKE EVACUATOR (MISCELLANEOUS) ×2 IMPLANT
WATER STERILE IRR 3000ML UROMA (IV SOLUTION) IMPLANT
WATER STERILE IRR 500ML POUR (IV SOLUTION) ×2 IMPLANT

## 2023-03-13 NOTE — Interval H&P Note (Signed)
History and Physical Interval Note:  03/13/2023 7:21 AM  David Atkins  has presented today for surgery, with the diagnosis of biliary colic umbilical hernia 2 cm.  The various methods of treatment have been discussed with the patient and family. After consideration of risks, benefits and other options for treatment, the patient has consented to  Procedure(s): XI ROBOTIC ASSISTED LAPAROSCOPIC CHOLECYSTECTOMY (N/A) INDOCYANINE GREEN FLUORESCENCE IMAGING (ICG) (N/A) HERNIA REPAIR UMBILICAL ADULT (N/A) as a surgical intervention.  The patient's history has been reviewed, patient examined, no change in status, stable for surgery.  I have reviewed the patient's chart and labs.  Questions were answered to the patient's satisfaction.     Jamilee Lafosse F Astrid Vides

## 2023-03-13 NOTE — Anesthesia Procedure Notes (Signed)
Procedure Name: Intubation Date/Time: 03/13/2023 7:46 AM  Performed by: Berniece Pap, CRNAPre-anesthesia Checklist: Patient identified, Emergency Drugs available, Suction available and Patient being monitored Patient Re-evaluated:Patient Re-evaluated prior to induction Oxygen Delivery Method: Circle system utilized Preoxygenation: Pre-oxygenation with 100% oxygen Induction Type: IV induction Ventilation: Mask ventilation without difficulty Tube type: Oral Tube size: 7.5 mm Number of attempts: 1 Airway Equipment and Method: Stylet and Oral airway Placement Confirmation: ETT inserted through vocal cords under direct vision, positive ETCO2 and breath sounds checked- equal and bilateral Secured at: 22 cm Tube secured with: Tape Dental Injury: Teeth and Oropharynx as per pre-operative assessment

## 2023-03-13 NOTE — Op Note (Signed)
Robotic assisted laparoscopic Cholecystectomy with intraoperative cholangiogram Umbilical hernia repair 3 cms Removal of cholecystostomy tube and placement of 19 blake drain RUQ  Pre-operative Diagnosis: HX cholecystitis and biliary colic  Post-operative Diagnosis: same \ Surgeon: Sterling Big, MD FACS  Anesthesia: Gen. with endotracheal tube  Findings: Chronic Cholecystitis w significant adhesions from the omentum to GB Umbilical hernia 3 cms  Estimated Blood Loss: 10 cc       Specimens: Gallbladder           Complications: none   Procedure Details  The patient was seen again in the Holding Room. The benefits, complications, treatment options, and expected outcomes were discussed with the patient. The risks of bleeding, infection, recurrence of symptoms, failure to resolve symptoms, bile duct damage, bile duct leak, retained common bile duct stone, bowel injury, any of which could require further surgery and/or ERCP, stent, or papillotomy were reviewed with the patient. The likelihood of improving the patient's symptoms with return to their baseline status is good.  The patient and/or family concurred with the proposed plan, giving informed consent.  The patient was taken to Operating Room, identified  and the procedure verified as Laparoscopic Cholecystectomy.  A Time Out was held and the above information confirmed.  Prior to the induction of general anesthesia, antibiotic prophylaxis was administered. VTE prophylaxis was in place. General endotracheal anesthesia was then administered and tolerated well. After the induction, perform an intraoperative cholangiogram by on doing the back from the cholecystostomy tube and under sterile technique injected 20 cc of contrast within the cholecystostomy tube.  We visualized the cystic duct as well as the common bile duct and hepatic radicles.  There was no evidence of filling defects.  Contrast did not reach the duodenum, the drain was  removed. The abdomen was prepped with Chloraprep and draped in the sterile fashion. The patient was positioned in the supine position. Periumbilical incision created and hernia sac encounter and excised. I dissected and clean the fascia, Cut down technique was used to enter the abdominal cavity and a Hasson trochar was placed after two vicryl stitches were anchored to the fascia. Pneumoperitoneum was then created with CO2 and tolerated well without any adverse changes in the patient's vital signs.  Three 8-mm ports were placed under direct vision. All skin incisions  were infiltrated with a local anesthetic agent before making the incision and placing the trocars.   The patient was positioned  in reverse Trendelenburg, robot was brought to the surgical field and docked in the standard fashion.  We made sure all the instrumentation was kept indirect view at all times and that there were no collision between the arms. I scrubbed out and went to the console.  The gallbladder was identified, the fundus grasped and retracted cephalad. Adhesions were lysed bluntly. The infundibulum was grasped and retracted laterally, exposing the peritoneum overlying the triangle of Calot. This was then divided and exposed in a blunt fashion. An extended critical view of the cystic duct and cystic artery was obtained.  The cystic duct was clearly identified and bluntly dissected.   Artery and duct were double clipped and divided. Using ICG cholangiography we visualize the cystic duct and CBD w/o evidence of bile injuries. The gallbladder was taken from the gallbladder fossa in a retrograde fashion with the electrocautery.  Hemostasis was achieved with the electrocautery. nspection of the right upper quadrant was performed. No bleeding, bile duct injury or leak, or bowel injury was noted. Robotic instruments and robotic arms  were undocked in the standard fashion.  I scrubbed back in.  The gallbladder was removed and placed  in an Endocatch bag.   Pneumoperitoneum was released.   The umbilical defect was closed with multiple 0 ethibond sutures.  3-0 Vicryl suture used to tack down the umbilicus and approximate the sub q tissue.Marland Kitchen 4-0 subcuticular Monocryl was used to close the skin. Dermabond was  applied.  The patient was then extubated and brought to the recovery room in stable condition. Sponge, lap, and needle counts were correct at closure and at the conclusion of the case.               Sterling Big, MD, FACS

## 2023-03-13 NOTE — Transfer of Care (Signed)
Immediate Anesthesia Transfer of Care Note  Patient: David Atkins  Procedure(s) Performed: XI ROBOTIC ASSISTED LAPAROSCOPIC CHOLECYSTECTOMY INDOCYANINE GREEN FLUORESCENCE IMAGING (ICG) HERNIA REPAIR UMBILICAL ADULT INTRAOPERATIVE CHOLANGIOGRAM  Patient Location: PACU  Anesthesia Type:General  Level of Consciousness: awake and alert   Airway & Oxygen Therapy: Patient Spontanous Breathing and Patient connected to face mask oxygen  Post-op Assessment: Report given to RN and Post -op Vital signs reviewed and stable  Post vital signs: Reviewed and stable  Last Vitals:  Vitals Value Taken Time  BP 158/97 03/13/23 0937  Temp 36.3 C 03/13/23 0937  Pulse 77 03/13/23 0939  Resp 16 03/13/23 0939  SpO2 100 % 03/13/23 0939  Vitals shown include unvalidated device data.  Last Pain:  Vitals:   03/13/23 0617  TempSrc: Oral  PainSc: 0-No pain      Patients Stated Pain Goal: 0 (03/13/23 0617)  Complications: No notable events documented.

## 2023-03-13 NOTE — Progress Notes (Signed)
Postop note: contacted anesthesia to speak w/pt's family to ease concerns over pt going home w/foley in place.  Per Dr. Everlene Farrier secure chat, Dr. Lonna Cobb notified to consult pt.  1450: Foley inserted with return of clear, light yellow urine. Pt tol well.

## 2023-03-13 NOTE — Discharge Instructions (Addendum)
Laparoscopic Cholecystectomy, Care After   These instructions give you information on caring for yourself after your procedure. Your doctor may also give you more specific instructions. Call your doctor if you have any problems or questions after your procedure.  HOME CARE  Change your bandages (dressings) as told by your doctor.  Keep the wound dry and clean. Wash the wound gently with soap and water. Pat the wound dry with a clean towel.  Do not take baths, swim, or use hot tubs for 2 weeks, or as told by your doctor.  Only take medicine as told by your doctor.  Eat a normal diet as told by your doctor.  Do not lift anything heavier than 10 pounds (4.5 kg) until your doctor says it is okay.  Do not play contact sports for 1 week, or as told by your doctor. GET HELP IF:  Your wound is red, puffy (swollen), or painful.  You have yellowish-white fluid (pus) coming from the wound.  You have fluid draining from the wound for more than 1 day.  You have a bad smell coming from the wound.  Your wound breaks open. GET HELP RIGHT AWAY IF:  You have trouble breathing.  You have chest pain.  You have a fever >101  You have pain in the shoulders (shoulder strap areas) that is getting worse.  You feel dizzy or pass out (faint).  You have severe belly (abdominal) pain.  You feel sick to your stomach (nauseous) or throw up (vomit) for more than 1 day.  AMBULATORY SURGERY  DISCHARGE INSTRUCTIONS   The drugs that you were given will stay in your system until tomorrow so for the next 24 hours you should not:  Drive an automobile Make any legal decisions Drink any alcoholic beverage   You may resume regular meals tomorrow.  Today it is better to start with liquids and gradually work up to solid foods.  You may eat anything you prefer, but it is better to start with liquids, then soup and crackers, and gradually work up to solid foods.   Please notify your doctor immediately if you have any  unusual bleeding, trouble breathing, redness and pain at the surgery site, drainage, fever, or pain not relieved by medication.    Additional Instructions: PLEASE LEAVE TEAL (EXPAREL) ARMBAND ON FOR 4 DAYS     Please contact your physician with any problems or Same Day Surgery at [phone], Monday through Friday 6 am to 4 pm, or [place] at [place] Main number at [phone]. 

## 2023-03-13 NOTE — Progress Notes (Signed)
Patient using urinal with assistance, voided 100 cc in urinal. Patient states feels he has not emptied bladder. Patient bladder scanned, greater than 511 detected on scan. Dr. Everlene Farrier notified. Per Dr. Everlene Farrier insert foley catheter and discharge patient home with urology follow up. Dr. Estil Daft office notified of patient being discharged with foley catheter and appointment made for patient with urology.

## 2023-03-13 NOTE — Anesthesia Postprocedure Evaluation (Signed)
Anesthesia Post Note  Patient: David Atkins  Procedure(s) Performed: XI ROBOTIC ASSISTED LAPAROSCOPIC CHOLECYSTECTOMY INDOCYANINE GREEN FLUORESCENCE IMAGING (ICG) HERNIA REPAIR UMBILICAL ADULT INTRAOPERATIVE CHOLANGIOGRAM  Patient location during evaluation: PACU Anesthesia Type: General Level of consciousness: awake and alert, oriented and patient cooperative Pain management: pain level controlled Vital Signs Assessment: post-procedure vital signs reviewed and stable Respiratory status: spontaneous breathing, nonlabored ventilation and respiratory function stable Cardiovascular status: blood pressure returned to baseline and stable Postop Assessment: adequate PO intake Anesthetic complications: no   No notable events documented.   Last Vitals:  Vitals:   03/13/23 1000 03/13/23 1016  BP: (!) 190/98 (!) 179/94  Pulse: 69 65  Resp: 16 15  Temp:    SpO2: 91% 99%    Last Pain:  Vitals:   03/13/23 1000  TempSrc:   PainSc: Asleep                 Reed Breech

## 2023-03-13 NOTE — Anesthesia Preprocedure Evaluation (Addendum)
Anesthesia Evaluation  Patient identified by MRN, date of birth, ID band Patient awake    Reviewed: Allergy & Precautions, NPO status , Patient's Chart, lab work & pertinent test results  History of Anesthesia Complications Negative for: history of anesthetic complications  Airway Mallampati: I   Neck ROM: Full    Dental  (+) Upper Dentures, Lower Dentures   Pulmonary sleep apnea , former smoker (quit 1964)   Pulmonary exam normal breath sounds clear to auscultation       Cardiovascular hypertension, + CAD (s/p MI and CABG 2006), + Peripheral Vascular Disease and +CHF (diastolic)  CABG: carotid disease.Normal cardiovascular exam Rhythm:Regular Rate:Normal  ECG 01/22/23: SR with 1st degree AVB, otherwise normal  LONG TERM CARDIAC EVENT MONITOR STUDY 11/02/21: 1. Predominant underlying normal sinus rhythm with an average heart rate of 74 bpm; range 51-179 bpm. 2. 128 supraventricular tachycardia runs occurred, the run with the fastest interval lasting 4 beats with a max rate of 179 bpm, the longest lasting 17 beats with an avg rate of 98 bpm.   3. Isolated SVEs were occasional (2.8%, 40975), SVE Couplets were rare (<1.0%, 1907), and SVE Triplets were rare (<1.0%, 682).  4. Isolated VEs were rare (<1.0%, 13422), VE Couplets were rare (<1.0%, 448), and VE Triplets were rare (<1.0%, 14).  5. Ventricular Bigeminy and Trigeminy were present.  6. Patient triggered events recorded   Echo 09/27/22: 1. Left ventricular ejection fraction, by estimation, is 60 to 65%. The left ventricle has normal function. The left ventricle has no regional wall motion abnormalities. Left ventricular diastolic parameters are consistent with Grade I diastolic dysfunction (impaired relaxation).  2. Right ventricular systolic function is normal. The right ventricular size is normal.  3. The mitral valve is grossly normal. Mild mitral valve regurgitation.  4. The  aortic valve was not well visualized. Aortic valve regurgitation is not visualized.     Neuro/Psych Seizures -,  Vertigo; HOH CVA (11/2022, found incidentally on imaging, no symptoms)    GI/Hepatic ,GERD  ,,  Endo/Other  negative endocrine ROS    Renal/GU negative Renal ROS   BPH    Musculoskeletal   Abdominal   Peds  Hematology negative hematology ROS (+)   Anesthesia Other Findings Reviewed and agree with David Atkins pre-anesthesia clinical review note.    Cardiology note 01/22/23:  ASSESSMENT AND PLAN: Preop cardiovascular evaluation Moderate but acceptable risk for cholecystectomy No further testing needed   3-vessel coronary artery disease -  Currently with no symptoms of angina. No further workup at this time. Continue current medication regimen.   Essential hypertension  Blood pressure is well controlled on today's visit. No changes made to the medications.   Syncope/near syncope History of vasovagal near syncope associated with diarrhea spells Reports no recent GI issues  Symptoms improved with probiotics   Cholecystitis Recent hospitalization with fever tachycardia tachypnea leukocytosis Has drain in place Treated with antibiotics in the hospital He has follow-up with general surgery, bothered by the drain, the smell, the lifestyle limitations He is interested in cholecystectomy   Lower extremity swelling/edema Likely component of dependent edema,  Continue compression hose Minimal edema on today's visit   Diarrhea Symptoms improved with probiotics   Hyperlipidemia LDL goal <70 Cholesterol is at goal on the current lipid regimen. No changes to the medications were made.   Bilateral carotid artery stenosis History of mild bilateral carotid disease Cholesterol numbers at goal .  Hx of CABG Stable,  no new testing indicated at  this time   OSA On CPAP   Long discussion concerning recent hospitalization, risk and benefit of gallbladder  surgery   Reproductive/Obstetrics                             Anesthesia Physical Anesthesia Plan  ASA: 3  Anesthesia Plan: General   Post-op Pain Management:    Induction: Intravenous  PONV Risk Score and Plan: 2 and Ondansetron, Dexamethasone and Treatment may vary due to age or medical condition  Airway Management Planned: Oral ETT  Additional Equipment:   Intra-op Plan:   Post-operative Plan: Extubation in OR  Informed Consent: I have reviewed the patients History and Physical, chart, labs and discussed the procedure including the risks, benefits and alternatives for the proposed anesthesia with the patient or authorized representative who has indicated his/her understanding and acceptance.     Dental advisory given  Plan Discussed with: CRNA  Anesthesia Plan Comments: (Patient consented for risks of anesthesia including but not limited to:  - adverse reactions to medications - damage to eyes, teeth, lips or other oral mucosa - nerve damage due to positioning  - sore throat or hoarseness - damage to heart, brain, nerves, lungs, other parts of body or loss of life  Informed patient about role of CRNA in peri- and intra-operative care.  Patient voiced understanding.)        Anesthesia Quick Evaluation

## 2023-03-14 ENCOUNTER — Telehealth: Payer: Self-pay | Admitting: *Deleted

## 2023-03-14 ENCOUNTER — Encounter: Payer: Self-pay | Admitting: Surgery

## 2023-03-14 LAB — SURGICAL PATHOLOGY

## 2023-03-14 NOTE — Telephone Encounter (Signed)
-----   Message from Riki Altes, MD sent at 03/14/2023 11:52 AM EDT ----- Regarding: Voiding trial Established patient who had urinary retention after cholecystectomy yesterday.  Please schedule PA appointment for voiding trial in approximately 1 week

## 2023-03-14 NOTE — Telephone Encounter (Signed)
Patient daughter states they are going to [place] to get cath removed. On 03/20/2023 at [facility]

## 2023-03-15 DIAGNOSIS — I708 Atherosclerosis of other arteries: Secondary | ICD-10-CM | POA: Diagnosis not present

## 2023-03-15 DIAGNOSIS — I110 Hypertensive heart disease with heart failure: Secondary | ICD-10-CM | POA: Diagnosis not present

## 2023-03-15 DIAGNOSIS — I2510 Atherosclerotic heart disease of native coronary artery without angina pectoris: Secondary | ICD-10-CM | POA: Diagnosis not present

## 2023-03-15 DIAGNOSIS — N3281 Overactive bladder: Secondary | ICD-10-CM | POA: Diagnosis not present

## 2023-03-15 DIAGNOSIS — K219 Gastro-esophageal reflux disease without esophagitis: Secondary | ICD-10-CM | POA: Diagnosis not present

## 2023-03-15 DIAGNOSIS — K5900 Constipation, unspecified: Secondary | ICD-10-CM | POA: Diagnosis not present

## 2023-03-15 DIAGNOSIS — R42 Dizziness and giddiness: Secondary | ICD-10-CM | POA: Diagnosis not present

## 2023-03-15 DIAGNOSIS — M4316 Spondylolisthesis, lumbar region: Secondary | ICD-10-CM | POA: Diagnosis not present

## 2023-03-15 DIAGNOSIS — K8020 Calculus of gallbladder without cholecystitis without obstruction: Secondary | ICD-10-CM | POA: Diagnosis not present

## 2023-03-15 DIAGNOSIS — I6789 Other cerebrovascular disease: Secondary | ICD-10-CM | POA: Diagnosis not present

## 2023-03-15 DIAGNOSIS — I5032 Chronic diastolic (congestive) heart failure: Secondary | ICD-10-CM | POA: Diagnosis not present

## 2023-03-15 DIAGNOSIS — G4733 Obstructive sleep apnea (adult) (pediatric): Secondary | ICD-10-CM | POA: Diagnosis not present

## 2023-03-15 DIAGNOSIS — I6523 Occlusion and stenosis of bilateral carotid arteries: Secondary | ICD-10-CM | POA: Diagnosis not present

## 2023-03-15 DIAGNOSIS — Z4803 Encounter for change or removal of drains: Secondary | ICD-10-CM | POA: Diagnosis not present

## 2023-03-15 DIAGNOSIS — M40202 Unspecified kyphosis, cervical region: Secondary | ICD-10-CM | POA: Diagnosis not present

## 2023-03-15 DIAGNOSIS — I739 Peripheral vascular disease, unspecified: Secondary | ICD-10-CM | POA: Diagnosis not present

## 2023-03-15 DIAGNOSIS — J449 Chronic obstructive pulmonary disease, unspecified: Secondary | ICD-10-CM | POA: Diagnosis not present

## 2023-03-15 DIAGNOSIS — E785 Hyperlipidemia, unspecified: Secondary | ICD-10-CM | POA: Diagnosis not present

## 2023-03-15 DIAGNOSIS — K810 Acute cholecystitis: Secondary | ICD-10-CM | POA: Diagnosis not present

## 2023-03-15 DIAGNOSIS — R2681 Unsteadiness on feet: Secondary | ICD-10-CM | POA: Diagnosis not present

## 2023-03-15 DIAGNOSIS — R609 Edema, unspecified: Secondary | ICD-10-CM | POA: Diagnosis not present

## 2023-03-16 DIAGNOSIS — K8020 Calculus of gallbladder without cholecystitis without obstruction: Secondary | ICD-10-CM | POA: Diagnosis not present

## 2023-03-16 DIAGNOSIS — I110 Hypertensive heart disease with heart failure: Secondary | ICD-10-CM | POA: Diagnosis not present

## 2023-03-16 DIAGNOSIS — I5032 Chronic diastolic (congestive) heart failure: Secondary | ICD-10-CM | POA: Diagnosis not present

## 2023-03-16 DIAGNOSIS — R2681 Unsteadiness on feet: Secondary | ICD-10-CM | POA: Diagnosis not present

## 2023-03-16 DIAGNOSIS — N3281 Overactive bladder: Secondary | ICD-10-CM | POA: Diagnosis not present

## 2023-03-16 DIAGNOSIS — E785 Hyperlipidemia, unspecified: Secondary | ICD-10-CM | POA: Diagnosis not present

## 2023-03-16 DIAGNOSIS — G4733 Obstructive sleep apnea (adult) (pediatric): Secondary | ICD-10-CM | POA: Diagnosis not present

## 2023-03-16 DIAGNOSIS — M4316 Spondylolisthesis, lumbar region: Secondary | ICD-10-CM | POA: Diagnosis not present

## 2023-03-16 DIAGNOSIS — I2510 Atherosclerotic heart disease of native coronary artery without angina pectoris: Secondary | ICD-10-CM | POA: Diagnosis not present

## 2023-03-16 DIAGNOSIS — R609 Edema, unspecified: Secondary | ICD-10-CM | POA: Diagnosis not present

## 2023-03-16 DIAGNOSIS — K810 Acute cholecystitis: Secondary | ICD-10-CM | POA: Diagnosis not present

## 2023-03-16 DIAGNOSIS — R42 Dizziness and giddiness: Secondary | ICD-10-CM | POA: Diagnosis not present

## 2023-03-16 DIAGNOSIS — I739 Peripheral vascular disease, unspecified: Secondary | ICD-10-CM | POA: Diagnosis not present

## 2023-03-16 DIAGNOSIS — M40202 Unspecified kyphosis, cervical region: Secondary | ICD-10-CM | POA: Diagnosis not present

## 2023-03-16 DIAGNOSIS — I6523 Occlusion and stenosis of bilateral carotid arteries: Secondary | ICD-10-CM | POA: Diagnosis not present

## 2023-03-16 DIAGNOSIS — Z4803 Encounter for change or removal of drains: Secondary | ICD-10-CM | POA: Diagnosis not present

## 2023-03-16 DIAGNOSIS — K5900 Constipation, unspecified: Secondary | ICD-10-CM | POA: Diagnosis not present

## 2023-03-16 DIAGNOSIS — J449 Chronic obstructive pulmonary disease, unspecified: Secondary | ICD-10-CM | POA: Diagnosis not present

## 2023-03-16 DIAGNOSIS — K219 Gastro-esophageal reflux disease without esophagitis: Secondary | ICD-10-CM | POA: Diagnosis not present

## 2023-03-16 DIAGNOSIS — I708 Atherosclerosis of other arteries: Secondary | ICD-10-CM | POA: Diagnosis not present

## 2023-03-16 DIAGNOSIS — I6789 Other cerebrovascular disease: Secondary | ICD-10-CM | POA: Diagnosis not present

## 2023-03-19 DIAGNOSIS — R609 Edema, unspecified: Secondary | ICD-10-CM | POA: Diagnosis not present

## 2023-03-19 DIAGNOSIS — R42 Dizziness and giddiness: Secondary | ICD-10-CM | POA: Diagnosis not present

## 2023-03-19 DIAGNOSIS — E785 Hyperlipidemia, unspecified: Secondary | ICD-10-CM | POA: Diagnosis not present

## 2023-03-19 DIAGNOSIS — I708 Atherosclerosis of other arteries: Secondary | ICD-10-CM | POA: Diagnosis not present

## 2023-03-19 DIAGNOSIS — I6789 Other cerebrovascular disease: Secondary | ICD-10-CM | POA: Diagnosis not present

## 2023-03-19 DIAGNOSIS — R2681 Unsteadiness on feet: Secondary | ICD-10-CM | POA: Diagnosis not present

## 2023-03-19 DIAGNOSIS — K219 Gastro-esophageal reflux disease without esophagitis: Secondary | ICD-10-CM | POA: Diagnosis not present

## 2023-03-19 DIAGNOSIS — I6523 Occlusion and stenosis of bilateral carotid arteries: Secondary | ICD-10-CM | POA: Diagnosis not present

## 2023-03-19 DIAGNOSIS — I739 Peripheral vascular disease, unspecified: Secondary | ICD-10-CM | POA: Diagnosis not present

## 2023-03-19 DIAGNOSIS — Z4803 Encounter for change or removal of drains: Secondary | ICD-10-CM | POA: Diagnosis not present

## 2023-03-19 DIAGNOSIS — J449 Chronic obstructive pulmonary disease, unspecified: Secondary | ICD-10-CM | POA: Diagnosis not present

## 2023-03-19 DIAGNOSIS — K810 Acute cholecystitis: Secondary | ICD-10-CM | POA: Diagnosis not present

## 2023-03-19 DIAGNOSIS — I110 Hypertensive heart disease with heart failure: Secondary | ICD-10-CM | POA: Diagnosis not present

## 2023-03-19 DIAGNOSIS — M40202 Unspecified kyphosis, cervical region: Secondary | ICD-10-CM | POA: Diagnosis not present

## 2023-03-19 DIAGNOSIS — I2510 Atherosclerotic heart disease of native coronary artery without angina pectoris: Secondary | ICD-10-CM | POA: Diagnosis not present

## 2023-03-19 DIAGNOSIS — M4316 Spondylolisthesis, lumbar region: Secondary | ICD-10-CM | POA: Diagnosis not present

## 2023-03-19 DIAGNOSIS — G4733 Obstructive sleep apnea (adult) (pediatric): Secondary | ICD-10-CM | POA: Diagnosis not present

## 2023-03-19 DIAGNOSIS — K5900 Constipation, unspecified: Secondary | ICD-10-CM | POA: Diagnosis not present

## 2023-03-19 DIAGNOSIS — K8020 Calculus of gallbladder without cholecystitis without obstruction: Secondary | ICD-10-CM | POA: Diagnosis not present

## 2023-03-19 DIAGNOSIS — I5032 Chronic diastolic (congestive) heart failure: Secondary | ICD-10-CM | POA: Diagnosis not present

## 2023-03-19 DIAGNOSIS — N3281 Overactive bladder: Secondary | ICD-10-CM | POA: Diagnosis not present

## 2023-03-20 ENCOUNTER — Ambulatory Visit: Payer: Self-pay

## 2023-03-20 ENCOUNTER — Ambulatory Visit (INDEPENDENT_AMBULATORY_CARE_PROVIDER_SITE_OTHER): Payer: Medicare Other | Admitting: Physician Assistant

## 2023-03-20 ENCOUNTER — Encounter: Payer: Self-pay | Admitting: Physician Assistant

## 2023-03-20 VITALS — BP 129/71 | HR 79 | Temp 98.3°F | Ht 70.0 in | Wt 175.4 lb

## 2023-03-20 DIAGNOSIS — K811 Chronic cholecystitis: Secondary | ICD-10-CM

## 2023-03-20 DIAGNOSIS — Z09 Encounter for follow-up examination after completed treatment for conditions other than malignant neoplasm: Secondary | ICD-10-CM

## 2023-03-20 DIAGNOSIS — K810 Acute cholecystitis: Secondary | ICD-10-CM

## 2023-03-20 DIAGNOSIS — R338 Other retention of urine: Secondary | ICD-10-CM | POA: Diagnosis not present

## 2023-03-20 NOTE — Patient Instructions (Signed)

## 2023-03-20 NOTE — Patient Outreach (Signed)
  Care Coordination   Follow Up Visit Note   03/20/2023 Name: David Atkins MRN: [id number] DOB: 1929/06/06  David Atkins is a 89 y.o. year old male who sees Darrick Huntsman, Mar Daring, MD for primary care. I  spoke with daughter/ DPR [name] today.   What matters to the patients health and wellness today?  Daughter states patient is doing well.  She states patient had foley catheter removed today and has a follow up appointment this afternoon with the general surgeon. She states patient still has drain at gallbladder location. She states she has not had to empty it over the last day and a half due to no drainage. Daughter reports patient is pushing fluids and has voided since removal of catheter. She reports pain level minimal and denies patient complaining of nausea or having any signs of infection at surgical site.  Daughter states states patient is receiving PT and nursing services with [facility].     Goals Addressed             This Visit's Progress    "Getting in to see general surgeon" and post hospital follow up/ management       Interventions Today    Flowsheet Row Most Recent Value  Chronic Disease   Chronic disease during today's visit Other  [choleycystitis]  General Interventions   General Interventions Discussed/Reviewed General Interventions Reviewed, Doctor Visits  [evaluation of current treatment plan for Choleycystitis and patients adherence to plan as established by provider. Assessed for pain level, home health services, signs/ symptoms of infection.]  Doctor Visits Discussed/Reviewed Doctor Visits Reviewed  [Confirmed patient has post surgical follow up visit.]  Education Interventions   Education Provided Provided Education  Provided Verbal Education On Other, When to see the doctor  [signs/ symptoms of infection discussed. Discussed need to increase fluid intake status post discontinues of foley catheter and need to avoid UTI. Advised to notify provider for  any new or ongoing symptoms.]  Pharmacy Interventions   Pharmacy Dicussed/Reviewed --  [Reviewed medications and discussed compliance.]               SDOH assessments and interventions completed:  No     Care Coordination Interventions:  Yes, provided   Follow up plan: Follow up call scheduled for 04/24/23    Encounter Outcome:  Pt. Visit Completed   George Ina RN,BSN,CCM [facility] Care Coordination [phone] direct line

## 2023-03-20 NOTE — Progress Notes (Signed)
[facility] POST-OP OFFICE VISIT  03/20/2023  HPI: David Atkins is a 89 y.o. male 7 days s/p robotic assisted laparoscopic cholecystectomy with IOC and umbilical hernia repair with Dr Everlene Farrier.   He has done very well given his age No abdominal pain, nausea, emesis, or bowel changes Appetite is improving Drain with minimal serous output Had foley removed this AM No other complaints   Vital signs: BP 129/71   Pulse 79   Temp 98.3 F (36.8 C) (Oral)   Ht  (1.778 m)   Wt 175 lb 6.4 oz (79.6 kg)   SpO2 94%   BMI 25.17 kg/m    Physical Exam: Constitutional: Well appearing male, NAD Abdomen: Soft, non-tender, non-distended, no rebound/guarding. Surgical drain in RLQ; output serous, he had lost the JP bulb just prior to arrival Skin: Laparoscopic incisions are healing well, no erythema or drainage   Assessment/Plan: This is a 89 y.o. male 7 days s/p robotic assisted laparoscopic cholecystectomy with IOC and umbilical hernia repair with Dr Everlene Farrier.    - Drain removed; dressing placed  - Pain control prn  - Reviewed wound care recommendation  - Reviewed lifting restrictions; 6 weeks total  - Reviewed surgical pathology; Acute on chronic cholecystitis  - I did offer follow up but he is doing so well we can follow up on as needed basis; He, and son, understand to call with questions/concerns  -- Lynden Oxford, PA-C [facility] 03/20/2023, 3:45 PM M-F: 7am - 4pm

## 2023-03-21 ENCOUNTER — Ambulatory Visit: Payer: Medicare Other | Admitting: Physician Assistant

## 2023-03-26 ENCOUNTER — Telehealth: Payer: Self-pay | Admitting: Internal Medicine

## 2023-03-26 DIAGNOSIS — E785 Hyperlipidemia, unspecified: Secondary | ICD-10-CM | POA: Diagnosis not present

## 2023-03-26 DIAGNOSIS — R2681 Unsteadiness on feet: Secondary | ICD-10-CM | POA: Diagnosis not present

## 2023-03-26 DIAGNOSIS — I110 Hypertensive heart disease with heart failure: Secondary | ICD-10-CM | POA: Diagnosis not present

## 2023-03-26 DIAGNOSIS — R42 Dizziness and giddiness: Secondary | ICD-10-CM | POA: Diagnosis not present

## 2023-03-26 DIAGNOSIS — K8020 Calculus of gallbladder without cholecystitis without obstruction: Secondary | ICD-10-CM | POA: Diagnosis not present

## 2023-03-26 DIAGNOSIS — I739 Peripheral vascular disease, unspecified: Secondary | ICD-10-CM | POA: Diagnosis not present

## 2023-03-26 DIAGNOSIS — I708 Atherosclerosis of other arteries: Secondary | ICD-10-CM | POA: Diagnosis not present

## 2023-03-26 DIAGNOSIS — I5032 Chronic diastolic (congestive) heart failure: Secondary | ICD-10-CM | POA: Diagnosis not present

## 2023-03-26 DIAGNOSIS — K5900 Constipation, unspecified: Secondary | ICD-10-CM | POA: Diagnosis not present

## 2023-03-26 DIAGNOSIS — K810 Acute cholecystitis: Secondary | ICD-10-CM | POA: Diagnosis not present

## 2023-03-26 DIAGNOSIS — N3281 Overactive bladder: Secondary | ICD-10-CM | POA: Diagnosis not present

## 2023-03-26 DIAGNOSIS — R609 Edema, unspecified: Secondary | ICD-10-CM | POA: Diagnosis not present

## 2023-03-26 DIAGNOSIS — Z4803 Encounter for change or removal of drains: Secondary | ICD-10-CM | POA: Diagnosis not present

## 2023-03-26 DIAGNOSIS — I6523 Occlusion and stenosis of bilateral carotid arteries: Secondary | ICD-10-CM | POA: Diagnosis not present

## 2023-03-26 DIAGNOSIS — M4316 Spondylolisthesis, lumbar region: Secondary | ICD-10-CM | POA: Diagnosis not present

## 2023-03-26 DIAGNOSIS — J449 Chronic obstructive pulmonary disease, unspecified: Secondary | ICD-10-CM | POA: Diagnosis not present

## 2023-03-26 DIAGNOSIS — I6789 Other cerebrovascular disease: Secondary | ICD-10-CM | POA: Diagnosis not present

## 2023-03-26 DIAGNOSIS — K219 Gastro-esophageal reflux disease without esophagitis: Secondary | ICD-10-CM | POA: Diagnosis not present

## 2023-03-26 DIAGNOSIS — I2510 Atherosclerotic heart disease of native coronary artery without angina pectoris: Secondary | ICD-10-CM | POA: Diagnosis not present

## 2023-03-26 DIAGNOSIS — M40202 Unspecified kyphosis, cervical region: Secondary | ICD-10-CM | POA: Diagnosis not present

## 2023-03-26 DIAGNOSIS — G4733 Obstructive sleep apnea (adult) (pediatric): Secondary | ICD-10-CM | POA: Diagnosis not present

## 2023-03-26 NOTE — Telephone Encounter (Signed)
Doesn't pt have to come in and do a walk test so we can prove that he does not need the oxygen any longer.

## 2023-03-26 NOTE — Telephone Encounter (Signed)
Pt is calling about someone coming to pick up oxygen. Pt stated he was told that he had to get his provider to release the equipment.

## 2023-03-27 NOTE — Telephone Encounter (Signed)
Letter has been drafted, printed and placed in quick sign folder.

## 2023-03-28 DIAGNOSIS — J449 Chronic obstructive pulmonary disease, unspecified: Secondary | ICD-10-CM | POA: Diagnosis not present

## 2023-03-28 DIAGNOSIS — K5900 Constipation, unspecified: Secondary | ICD-10-CM | POA: Diagnosis not present

## 2023-03-28 DIAGNOSIS — I739 Peripheral vascular disease, unspecified: Secondary | ICD-10-CM | POA: Diagnosis not present

## 2023-03-28 DIAGNOSIS — K219 Gastro-esophageal reflux disease without esophagitis: Secondary | ICD-10-CM | POA: Diagnosis not present

## 2023-03-28 DIAGNOSIS — I6523 Occlusion and stenosis of bilateral carotid arteries: Secondary | ICD-10-CM | POA: Diagnosis not present

## 2023-03-28 DIAGNOSIS — M4316 Spondylolisthesis, lumbar region: Secondary | ICD-10-CM | POA: Diagnosis not present

## 2023-03-28 DIAGNOSIS — I708 Atherosclerosis of other arteries: Secondary | ICD-10-CM | POA: Diagnosis not present

## 2023-03-28 DIAGNOSIS — I2510 Atherosclerotic heart disease of native coronary artery without angina pectoris: Secondary | ICD-10-CM | POA: Diagnosis not present

## 2023-03-28 DIAGNOSIS — R2681 Unsteadiness on feet: Secondary | ICD-10-CM | POA: Diagnosis not present

## 2023-03-28 DIAGNOSIS — R42 Dizziness and giddiness: Secondary | ICD-10-CM | POA: Diagnosis not present

## 2023-03-28 DIAGNOSIS — I110 Hypertensive heart disease with heart failure: Secondary | ICD-10-CM | POA: Diagnosis not present

## 2023-03-28 DIAGNOSIS — M40202 Unspecified kyphosis, cervical region: Secondary | ICD-10-CM | POA: Diagnosis not present

## 2023-03-28 DIAGNOSIS — I6789 Other cerebrovascular disease: Secondary | ICD-10-CM | POA: Diagnosis not present

## 2023-03-28 DIAGNOSIS — E785 Hyperlipidemia, unspecified: Secondary | ICD-10-CM | POA: Diagnosis not present

## 2023-03-28 DIAGNOSIS — G4733 Obstructive sleep apnea (adult) (pediatric): Secondary | ICD-10-CM | POA: Diagnosis not present

## 2023-03-28 DIAGNOSIS — R609 Edema, unspecified: Secondary | ICD-10-CM | POA: Diagnosis not present

## 2023-03-28 DIAGNOSIS — I5032 Chronic diastolic (congestive) heart failure: Secondary | ICD-10-CM | POA: Diagnosis not present

## 2023-03-28 DIAGNOSIS — Z4803 Encounter for change or removal of drains: Secondary | ICD-10-CM | POA: Diagnosis not present

## 2023-03-28 DIAGNOSIS — N3281 Overactive bladder: Secondary | ICD-10-CM | POA: Diagnosis not present

## 2023-03-28 DIAGNOSIS — K810 Acute cholecystitis: Secondary | ICD-10-CM | POA: Diagnosis not present

## 2023-03-28 DIAGNOSIS — K8020 Calculus of gallbladder without cholecystitis without obstruction: Secondary | ICD-10-CM | POA: Diagnosis not present

## 2023-03-30 DIAGNOSIS — K810 Acute cholecystitis: Secondary | ICD-10-CM | POA: Diagnosis not present

## 2023-03-30 DIAGNOSIS — K219 Gastro-esophageal reflux disease without esophagitis: Secondary | ICD-10-CM | POA: Diagnosis not present

## 2023-03-30 DIAGNOSIS — N3281 Overactive bladder: Secondary | ICD-10-CM | POA: Diagnosis not present

## 2023-03-30 DIAGNOSIS — I6523 Occlusion and stenosis of bilateral carotid arteries: Secondary | ICD-10-CM | POA: Diagnosis not present

## 2023-03-30 DIAGNOSIS — R42 Dizziness and giddiness: Secondary | ICD-10-CM | POA: Diagnosis not present

## 2023-03-30 DIAGNOSIS — R609 Edema, unspecified: Secondary | ICD-10-CM | POA: Diagnosis not present

## 2023-03-30 DIAGNOSIS — M40202 Unspecified kyphosis, cervical region: Secondary | ICD-10-CM | POA: Diagnosis not present

## 2023-03-30 DIAGNOSIS — K8020 Calculus of gallbladder without cholecystitis without obstruction: Secondary | ICD-10-CM | POA: Diagnosis not present

## 2023-03-30 DIAGNOSIS — G4733 Obstructive sleep apnea (adult) (pediatric): Secondary | ICD-10-CM | POA: Diagnosis not present

## 2023-03-30 DIAGNOSIS — I10 Essential (primary) hypertension: Secondary | ICD-10-CM | POA: Diagnosis not present

## 2023-03-30 DIAGNOSIS — I6789 Other cerebrovascular disease: Secondary | ICD-10-CM | POA: Diagnosis not present

## 2023-03-30 DIAGNOSIS — I739 Peripheral vascular disease, unspecified: Secondary | ICD-10-CM | POA: Diagnosis not present

## 2023-03-30 DIAGNOSIS — I5032 Chronic diastolic (congestive) heart failure: Secondary | ICD-10-CM | POA: Diagnosis not present

## 2023-03-30 DIAGNOSIS — M4316 Spondylolisthesis, lumbar region: Secondary | ICD-10-CM | POA: Diagnosis not present

## 2023-03-30 DIAGNOSIS — Z4803 Encounter for change or removal of drains: Secondary | ICD-10-CM | POA: Diagnosis not present

## 2023-03-30 DIAGNOSIS — J449 Chronic obstructive pulmonary disease, unspecified: Secondary | ICD-10-CM | POA: Diagnosis not present

## 2023-03-30 DIAGNOSIS — I110 Hypertensive heart disease with heart failure: Secondary | ICD-10-CM | POA: Diagnosis not present

## 2023-03-30 DIAGNOSIS — E785 Hyperlipidemia, unspecified: Secondary | ICD-10-CM | POA: Diagnosis not present

## 2023-03-30 DIAGNOSIS — M6281 Muscle weakness (generalized): Secondary | ICD-10-CM | POA: Diagnosis not present

## 2023-03-30 DIAGNOSIS — I708 Atherosclerosis of other arteries: Secondary | ICD-10-CM | POA: Diagnosis not present

## 2023-03-30 DIAGNOSIS — K5900 Constipation, unspecified: Secondary | ICD-10-CM | POA: Diagnosis not present

## 2023-03-30 DIAGNOSIS — R2681 Unsteadiness on feet: Secondary | ICD-10-CM | POA: Diagnosis not present

## 2023-03-30 DIAGNOSIS — I2510 Atherosclerotic heart disease of native coronary artery without angina pectoris: Secondary | ICD-10-CM | POA: Diagnosis not present

## 2023-04-02 DIAGNOSIS — R609 Edema, unspecified: Secondary | ICD-10-CM | POA: Diagnosis not present

## 2023-04-02 DIAGNOSIS — I2510 Atherosclerotic heart disease of native coronary artery without angina pectoris: Secondary | ICD-10-CM | POA: Diagnosis not present

## 2023-04-02 DIAGNOSIS — I6523 Occlusion and stenosis of bilateral carotid arteries: Secondary | ICD-10-CM | POA: Diagnosis not present

## 2023-04-02 DIAGNOSIS — K810 Acute cholecystitis: Secondary | ICD-10-CM | POA: Diagnosis not present

## 2023-04-02 DIAGNOSIS — M40202 Unspecified kyphosis, cervical region: Secondary | ICD-10-CM | POA: Diagnosis not present

## 2023-04-02 DIAGNOSIS — K5900 Constipation, unspecified: Secondary | ICD-10-CM | POA: Diagnosis not present

## 2023-04-02 DIAGNOSIS — I110 Hypertensive heart disease with heart failure: Secondary | ICD-10-CM | POA: Diagnosis not present

## 2023-04-02 DIAGNOSIS — R2681 Unsteadiness on feet: Secondary | ICD-10-CM | POA: Diagnosis not present

## 2023-04-02 DIAGNOSIS — I708 Atherosclerosis of other arteries: Secondary | ICD-10-CM | POA: Diagnosis not present

## 2023-04-02 DIAGNOSIS — K8020 Calculus of gallbladder without cholecystitis without obstruction: Secondary | ICD-10-CM | POA: Diagnosis not present

## 2023-04-02 DIAGNOSIS — I739 Peripheral vascular disease, unspecified: Secondary | ICD-10-CM | POA: Diagnosis not present

## 2023-04-02 DIAGNOSIS — N3281 Overactive bladder: Secondary | ICD-10-CM | POA: Diagnosis not present

## 2023-04-02 DIAGNOSIS — Z4803 Encounter for change or removal of drains: Secondary | ICD-10-CM | POA: Diagnosis not present

## 2023-04-02 DIAGNOSIS — I5032 Chronic diastolic (congestive) heart failure: Secondary | ICD-10-CM | POA: Diagnosis not present

## 2023-04-02 DIAGNOSIS — E785 Hyperlipidemia, unspecified: Secondary | ICD-10-CM | POA: Diagnosis not present

## 2023-04-02 DIAGNOSIS — I6789 Other cerebrovascular disease: Secondary | ICD-10-CM | POA: Diagnosis not present

## 2023-04-02 DIAGNOSIS — G4733 Obstructive sleep apnea (adult) (pediatric): Secondary | ICD-10-CM | POA: Diagnosis not present

## 2023-04-02 DIAGNOSIS — M4316 Spondylolisthesis, lumbar region: Secondary | ICD-10-CM | POA: Diagnosis not present

## 2023-04-02 DIAGNOSIS — J449 Chronic obstructive pulmonary disease, unspecified: Secondary | ICD-10-CM | POA: Diagnosis not present

## 2023-04-02 DIAGNOSIS — K219 Gastro-esophageal reflux disease without esophagitis: Secondary | ICD-10-CM | POA: Diagnosis not present

## 2023-04-02 DIAGNOSIS — R42 Dizziness and giddiness: Secondary | ICD-10-CM | POA: Diagnosis not present

## 2023-04-03 NOTE — Telephone Encounter (Signed)
Patient called and note was read about letter being sent out. Patient wanted to give Dr Darrick Huntsman the fax number for the oxygen company, [phone].

## 2023-04-03 NOTE — Telephone Encounter (Signed)
Faxed to number pt provided.

## 2023-04-07 DIAGNOSIS — K8020 Calculus of gallbladder without cholecystitis without obstruction: Secondary | ICD-10-CM | POA: Diagnosis not present

## 2023-04-07 DIAGNOSIS — I739 Peripheral vascular disease, unspecified: Secondary | ICD-10-CM | POA: Diagnosis not present

## 2023-04-07 DIAGNOSIS — I110 Hypertensive heart disease with heart failure: Secondary | ICD-10-CM | POA: Diagnosis not present

## 2023-04-07 DIAGNOSIS — M4316 Spondylolisthesis, lumbar region: Secondary | ICD-10-CM | POA: Diagnosis not present

## 2023-04-07 DIAGNOSIS — G4733 Obstructive sleep apnea (adult) (pediatric): Secondary | ICD-10-CM | POA: Diagnosis not present

## 2023-04-07 DIAGNOSIS — K810 Acute cholecystitis: Secondary | ICD-10-CM | POA: Diagnosis not present

## 2023-04-07 DIAGNOSIS — R2681 Unsteadiness on feet: Secondary | ICD-10-CM | POA: Diagnosis not present

## 2023-04-07 DIAGNOSIS — K5900 Constipation, unspecified: Secondary | ICD-10-CM | POA: Diagnosis not present

## 2023-04-07 DIAGNOSIS — I6789 Other cerebrovascular disease: Secondary | ICD-10-CM | POA: Diagnosis not present

## 2023-04-07 DIAGNOSIS — I2510 Atherosclerotic heart disease of native coronary artery without angina pectoris: Secondary | ICD-10-CM | POA: Diagnosis not present

## 2023-04-07 DIAGNOSIS — R42 Dizziness and giddiness: Secondary | ICD-10-CM | POA: Diagnosis not present

## 2023-04-07 DIAGNOSIS — Z4803 Encounter for change or removal of drains: Secondary | ICD-10-CM | POA: Diagnosis not present

## 2023-04-07 DIAGNOSIS — I5032 Chronic diastolic (congestive) heart failure: Secondary | ICD-10-CM | POA: Diagnosis not present

## 2023-04-07 DIAGNOSIS — K219 Gastro-esophageal reflux disease without esophagitis: Secondary | ICD-10-CM | POA: Diagnosis not present

## 2023-04-07 DIAGNOSIS — N3281 Overactive bladder: Secondary | ICD-10-CM | POA: Diagnosis not present

## 2023-04-07 DIAGNOSIS — I6523 Occlusion and stenosis of bilateral carotid arteries: Secondary | ICD-10-CM | POA: Diagnosis not present

## 2023-04-07 DIAGNOSIS — I708 Atherosclerosis of other arteries: Secondary | ICD-10-CM | POA: Diagnosis not present

## 2023-04-07 DIAGNOSIS — J449 Chronic obstructive pulmonary disease, unspecified: Secondary | ICD-10-CM | POA: Diagnosis not present

## 2023-04-07 DIAGNOSIS — E785 Hyperlipidemia, unspecified: Secondary | ICD-10-CM | POA: Diagnosis not present

## 2023-04-07 DIAGNOSIS — R609 Edema, unspecified: Secondary | ICD-10-CM | POA: Diagnosis not present

## 2023-04-07 DIAGNOSIS — M40202 Unspecified kyphosis, cervical region: Secondary | ICD-10-CM | POA: Diagnosis not present

## 2023-04-09 DIAGNOSIS — K219 Gastro-esophageal reflux disease without esophagitis: Secondary | ICD-10-CM | POA: Diagnosis not present

## 2023-04-09 DIAGNOSIS — R2681 Unsteadiness on feet: Secondary | ICD-10-CM | POA: Diagnosis not present

## 2023-04-09 DIAGNOSIS — N3281 Overactive bladder: Secondary | ICD-10-CM | POA: Diagnosis not present

## 2023-04-09 DIAGNOSIS — E785 Hyperlipidemia, unspecified: Secondary | ICD-10-CM | POA: Diagnosis not present

## 2023-04-09 DIAGNOSIS — I5032 Chronic diastolic (congestive) heart failure: Secondary | ICD-10-CM | POA: Diagnosis not present

## 2023-04-09 DIAGNOSIS — K8020 Calculus of gallbladder without cholecystitis without obstruction: Secondary | ICD-10-CM | POA: Diagnosis not present

## 2023-04-09 DIAGNOSIS — K5900 Constipation, unspecified: Secondary | ICD-10-CM | POA: Diagnosis not present

## 2023-04-09 DIAGNOSIS — R609 Edema, unspecified: Secondary | ICD-10-CM | POA: Diagnosis not present

## 2023-04-09 DIAGNOSIS — M40202 Unspecified kyphosis, cervical region: Secondary | ICD-10-CM | POA: Diagnosis not present

## 2023-04-09 DIAGNOSIS — I708 Atherosclerosis of other arteries: Secondary | ICD-10-CM | POA: Diagnosis not present

## 2023-04-09 DIAGNOSIS — G4733 Obstructive sleep apnea (adult) (pediatric): Secondary | ICD-10-CM | POA: Diagnosis not present

## 2023-04-09 DIAGNOSIS — I110 Hypertensive heart disease with heart failure: Secondary | ICD-10-CM | POA: Diagnosis not present

## 2023-04-09 DIAGNOSIS — M4316 Spondylolisthesis, lumbar region: Secondary | ICD-10-CM | POA: Diagnosis not present

## 2023-04-09 DIAGNOSIS — I6789 Other cerebrovascular disease: Secondary | ICD-10-CM | POA: Diagnosis not present

## 2023-04-09 DIAGNOSIS — R42 Dizziness and giddiness: Secondary | ICD-10-CM | POA: Diagnosis not present

## 2023-04-09 DIAGNOSIS — K810 Acute cholecystitis: Secondary | ICD-10-CM | POA: Diagnosis not present

## 2023-04-09 DIAGNOSIS — I6523 Occlusion and stenosis of bilateral carotid arteries: Secondary | ICD-10-CM | POA: Diagnosis not present

## 2023-04-09 DIAGNOSIS — Z4803 Encounter for change or removal of drains: Secondary | ICD-10-CM | POA: Diagnosis not present

## 2023-04-09 DIAGNOSIS — J449 Chronic obstructive pulmonary disease, unspecified: Secondary | ICD-10-CM | POA: Diagnosis not present

## 2023-04-09 DIAGNOSIS — I2510 Atherosclerotic heart disease of native coronary artery without angina pectoris: Secondary | ICD-10-CM | POA: Diagnosis not present

## 2023-04-09 DIAGNOSIS — I739 Peripheral vascular disease, unspecified: Secondary | ICD-10-CM | POA: Diagnosis not present

## 2023-04-12 DIAGNOSIS — Z4803 Encounter for change or removal of drains: Secondary | ICD-10-CM | POA: Diagnosis not present

## 2023-04-12 DIAGNOSIS — R42 Dizziness and giddiness: Secondary | ICD-10-CM | POA: Diagnosis not present

## 2023-04-12 DIAGNOSIS — I6789 Other cerebrovascular disease: Secondary | ICD-10-CM | POA: Diagnosis not present

## 2023-04-12 DIAGNOSIS — I5032 Chronic diastolic (congestive) heart failure: Secondary | ICD-10-CM | POA: Diagnosis not present

## 2023-04-12 DIAGNOSIS — R2681 Unsteadiness on feet: Secondary | ICD-10-CM | POA: Diagnosis not present

## 2023-04-12 DIAGNOSIS — K219 Gastro-esophageal reflux disease without esophagitis: Secondary | ICD-10-CM | POA: Diagnosis not present

## 2023-04-12 DIAGNOSIS — I6523 Occlusion and stenosis of bilateral carotid arteries: Secondary | ICD-10-CM | POA: Diagnosis not present

## 2023-04-12 DIAGNOSIS — K5900 Constipation, unspecified: Secondary | ICD-10-CM | POA: Diagnosis not present

## 2023-04-12 DIAGNOSIS — M4316 Spondylolisthesis, lumbar region: Secondary | ICD-10-CM | POA: Diagnosis not present

## 2023-04-12 DIAGNOSIS — R609 Edema, unspecified: Secondary | ICD-10-CM | POA: Diagnosis not present

## 2023-04-12 DIAGNOSIS — I2510 Atherosclerotic heart disease of native coronary artery without angina pectoris: Secondary | ICD-10-CM | POA: Diagnosis not present

## 2023-04-12 DIAGNOSIS — I110 Hypertensive heart disease with heart failure: Secondary | ICD-10-CM | POA: Diagnosis not present

## 2023-04-12 DIAGNOSIS — I708 Atherosclerosis of other arteries: Secondary | ICD-10-CM | POA: Diagnosis not present

## 2023-04-12 DIAGNOSIS — I739 Peripheral vascular disease, unspecified: Secondary | ICD-10-CM | POA: Diagnosis not present

## 2023-04-12 DIAGNOSIS — E785 Hyperlipidemia, unspecified: Secondary | ICD-10-CM | POA: Diagnosis not present

## 2023-04-12 DIAGNOSIS — J449 Chronic obstructive pulmonary disease, unspecified: Secondary | ICD-10-CM | POA: Diagnosis not present

## 2023-04-12 DIAGNOSIS — N3281 Overactive bladder: Secondary | ICD-10-CM | POA: Diagnosis not present

## 2023-04-12 DIAGNOSIS — M40202 Unspecified kyphosis, cervical region: Secondary | ICD-10-CM | POA: Diagnosis not present

## 2023-04-12 DIAGNOSIS — G4733 Obstructive sleep apnea (adult) (pediatric): Secondary | ICD-10-CM | POA: Diagnosis not present

## 2023-04-12 DIAGNOSIS — K810 Acute cholecystitis: Secondary | ICD-10-CM | POA: Diagnosis not present

## 2023-04-12 DIAGNOSIS — K8020 Calculus of gallbladder without cholecystitis without obstruction: Secondary | ICD-10-CM | POA: Diagnosis not present

## 2023-04-13 ENCOUNTER — Telehealth: Payer: Self-pay | Admitting: Internal Medicine

## 2023-04-13 NOTE — Telephone Encounter (Signed)
susan smith from [facility] called stating pt has been discharged from home health but had another fall this weekend. Pt went to go to the bathroom and passed out . Pt has multiple abrasions on his back and arm but he did not seek medcial atteintion

## 2023-04-13 NOTE — Telephone Encounter (Signed)
Called pt to see how he ws doing since his fall over the weekend that home health called about today. Pt stated that he fainted and fell getting some scrapes and bruises, no broken bones he stated. He stated that he is unable to lift his arm above his head and his elbow is sore. I asked pt about the fainting and he stated that this is not new and has done that many of times. I offered pt an appt and he stated that he did not want one at this time but if his soreness wasn't gone by Monday he would give [place] a call back to schedule an appt with Dr. Darrick Huntsman.

## 2023-04-24 ENCOUNTER — Ambulatory Visit: Payer: Self-pay

## 2023-04-24 NOTE — Patient Outreach (Signed)
  Care Coordination   Follow Up Visit Note   04/24/2023 Name: Davanta Juergensen MRN: [id number] DOB: 03-11-1929  Haston Bosshart is a 89 y.o. year old male who sees Darrick Huntsman, Mar Daring, MD for primary care. I spoke with  Billie Lade by phone today.  What matters to the patients health and wellness today?  Patient states he is doing much better. He reports everything around his cholecystomy/hernia repair surgery has cleared up and healed. Patient reports sustaining a fall approximately 2 weeks ago due to him blacking out.  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 Patient states he has dealt with this many years and seen many doctors for it.  He states there is nothing more the doctors can do.  He states this may occur once every few months or so if he has issues with dehydration or low blood pressure.   Patient denies any injuries from fall. He reports having a sore shoulder but states this has resolved as well.    Goals Addressed             This Visit's Progress    "Getting in to see general surgeon" and post hospital follow up/ management       Interventions Today    Flowsheet Row Most Recent Value  Chronic Disease   Chronic disease during today's visit Chronic Obstructive Pulmonary Disease (COPD), Other  [status post Cholecystitis, falls]  General Interventions   General Interventions Discussed/Reviewed General Interventions Reviewed, Doctor Visits  [evaluation of current treatment plan related to COPD/ status post Cholecystitis/ falls, patients adherence to plan as established by provider.  Assessed for new/ongoing concerns for recent laparoscopic cholecystectomy with IOC and umbilical hernia repair]  Doctor Visits Discussed/Reviewed Doctor Visits Reviewed  [discussed upcoming provider visits]  Pharmacy Interventions   Pharmacy Dicussed/Reviewed Pharmacy Topics Reviewed  [medications discussed and compliance encouraged.]  Safety Interventions   Safety Discussed/Reviewed Fall Risk   [Recent fall discussed.  Assessed for fall related injury]               SDOH assessments and interventions completed:  No     Care Coordination Interventions:  Yes, provided   Follow up plan: Follow up call scheduled for 05/29/23    Encounter Outcome:  Pt. Visit Completed   George Ina RN,BSN,CCM [facility] Care Coordination [phone] direct line

## 2023-04-29 DIAGNOSIS — I10 Essential (primary) hypertension: Secondary | ICD-10-CM | POA: Diagnosis not present

## 2023-04-29 DIAGNOSIS — M6281 Muscle weakness (generalized): Secondary | ICD-10-CM | POA: Diagnosis not present

## 2023-05-05 ENCOUNTER — Other Ambulatory Visit: Payer: Self-pay

## 2023-05-05 ENCOUNTER — Telehealth: Payer: Self-pay | Admitting: Emergency Medicine

## 2023-05-05 ENCOUNTER — Emergency Department: Payer: Medicare Other

## 2023-05-05 ENCOUNTER — Emergency Department
Admission: EM | Admit: 2023-05-05 | Discharge: 2023-05-05 | Disposition: A | Discharge status: Home / Self Care | Payer: Medicare Other | Attending: Emergency Medicine | Admitting: Emergency Medicine

## 2023-05-05 DIAGNOSIS — S8002XA Contusion of left knee, initial encounter: Secondary | ICD-10-CM | POA: Diagnosis not present

## 2023-05-05 DIAGNOSIS — W08XXXA Fall from other furniture, initial encounter: Secondary | ICD-10-CM | POA: Diagnosis not present

## 2023-05-05 DIAGNOSIS — I110 Hypertensive heart disease with heart failure: Secondary | ICD-10-CM | POA: Insufficient documentation

## 2023-05-05 DIAGNOSIS — W19XXXA Unspecified fall, initial encounter: Secondary | ICD-10-CM

## 2023-05-05 DIAGNOSIS — I6523 Occlusion and stenosis of bilateral carotid arteries: Secondary | ICD-10-CM | POA: Diagnosis not present

## 2023-05-05 DIAGNOSIS — I2510 Atherosclerotic heart disease of native coronary artery without angina pectoris: Secondary | ICD-10-CM | POA: Insufficient documentation

## 2023-05-05 DIAGNOSIS — I5032 Chronic diastolic (congestive) heart failure: Secondary | ICD-10-CM | POA: Insufficient documentation

## 2023-05-05 DIAGNOSIS — M25562 Pain in left knee: Secondary | ICD-10-CM | POA: Diagnosis not present

## 2023-05-05 DIAGNOSIS — M47812 Spondylosis without myelopathy or radiculopathy, cervical region: Secondary | ICD-10-CM | POA: Diagnosis not present

## 2023-05-05 DIAGNOSIS — S0990XA Unspecified injury of head, initial encounter: Secondary | ICD-10-CM | POA: Diagnosis not present

## 2023-05-05 DIAGNOSIS — R600 Localized edema: Secondary | ICD-10-CM | POA: Diagnosis not present

## 2023-05-05 MED ORDER — LIDOCAINE 5 % EX PTCH: 1.0000 | MEDICATED_PATCH | CUTANEOUS | 1 refills | Status: DC

## 2023-05-05 MED ORDER — LIDOCAINE 5 % EX PTCH
1.0000 | MEDICATED_PATCH | CUTANEOUS | Status: DC
  Administered 2023-05-05: 1 via TRANSDERMAL
  Filled 2023-05-05: qty 1

## 2023-05-05 MED ORDER — ACETAMINOPHEN 325 MG PO TABS
650.0000 mg | ORAL_TABLET | Freq: Once | ORAL | Status: AC
  Administered 2023-05-05: 650 mg via ORAL
  Filled 2023-05-05: qty 2

## 2023-05-05 NOTE — ED Notes (Signed)
Patient assisted to stand and could weight bear and pivot to the wheelchair.

## 2023-05-05 NOTE — Telephone Encounter (Signed)
Lidoderm rx to [facility]

## 2023-05-05 NOTE — Discharge Instructions (Addendum)
Your x-ray and CT scans are normal.  You may use the Ace wrap during the day and remove it at night.  You may take Tylenol per package instructions to help with your pain.  Please return for any new, worsening, or change in symptoms or other concerns.  It was a pleasure caring for you today.

## 2023-05-05 NOTE — ED Triage Notes (Signed)
Pt was sitting on a stool and fell off of it yesterday. Denies head injury or LOC. Pt c/o pain to L medial knee. Pt has bruising to bilateral elbows and abrasions to the back.

## 2023-05-05 NOTE — ED Provider Notes (Signed)
[facility] Provider Note    Event Date/Time   First MD Initiated Contact with Patient 05/05/23 1230     (approximate)   History   Fall   HPI  David Atkins is a 89 y.o. male who presents today with his daughter for evaluation of left knee pain after a fall.  Patient was sitting on a stool yesterday when the stool went out from under him and he fell backwards.  He reports that somehow in the process he hit his knee but he is unsure on what.  He did not strike his head or lose consciousness.  He reports that he scraped his back.  His daughter reports that he has been acting his normal self since this happened.  He has been able to ambulate.  Patient denies any numbness or tingling or weakness.  No chest pain or shortness of breath.  No abdominal pain, nausea, vomiting.  No neck pain or paresthesias.  He takes baby aspirin only.  No back pain.  Patient Active Problem List   Diagnosis Date Noted   Ventral hernia without obstruction or gangrene 03/13/2023   Watery diarrhea 01/02/2023   Generalized weakness 01/02/2023   CAP (community acquired pneumonia) 12/27/2022   Sepsis (HCC) 12/27/2022   CAD (coronary artery disease) 12/27/2022   Chronic diastolic CHF (congestive heart failure) (HCC) 12/27/2022   Hypokalemia 12/27/2022   Cholecystitis, acute 12/27/2022   Atherosclerosis of aortic bifurcation and common iliac arteries (HCC) 12/09/2022   Cholelithiasis 12/07/2022   Right lower quadrant abdominal pain 10/24/2022   Full incontinence of feces 10/24/2022   Laceration of left hand without foreign body 09/27/2022   OAB (overactive bladder)    Allergic rhinitis 03/29/2020   Allergic conjunctivitis 03/29/2020   Pharyngoesophageal dysphagia 07/07/2019   Hospital discharge follow-up 12/29/2018   Seizure (HCC) 12/16/2018   PAD (peripheral artery disease) (HCC) 10/01/2018   Cerebrovascular disease 02/07/2018   CAD (coronary artery disease), native coronary artery  11/04/2017   Vertigo 10/11/2017   Right foot pain 08/31/2017   Edema of right lower extremity 07/18/2017   RLS (restless legs syndrome) 07/18/2017   Hx of CABG 09/21/2016   Poor balance 12/04/2015   IBS (irritable bowel syndrome) 11/03/2015   Generalized anxiety disorder 01/20/2015   Overweight 05/24/2014   Unspecified vitamin D deficiency 05/22/2014   OSA on CPAP 01/19/2014   Lumbar pain with radiation down left leg 12/01/2013   Encounter for preventive health examination 01/13/2013   GERD (gastroesophageal reflux disease) 01/06/2013   Bilateral carotid artery stenosis 12/07/2012   Hyperlipidemia LDL goal <70 07/09/2012   Impaired glucose tolerance 07/09/2012   BPPV (benign paroxysmal positional vertigo) 07/09/2012   Osteoporosis    BPH (benign prostatic hyperplasia)    Hypertension           Physical Exam   Triage Vital Signs: ED Triage Vitals  Enc Vitals Group     BP 05/05/23 1158 (!) 150/56     Pulse Rate 05/05/23 1158 76     Resp 05/05/23 1158 20     Temp 05/05/23 1158 98 F (36.7 C)     Temp Source 05/05/23 1158 Oral     SpO2 05/05/23 1158 99 %     Weight 05/05/23 1159 175 lb (79.4 kg)     Height 05/05/23 1159 5\' 10"  (1.778 m)     Head Circumference --      Peak Flow --      Pain Score 05/05/23 1159 0  Pain Loc --      Pain Edu? --      Excl. in GC? --     Most recent vital signs: Vitals:   05/05/23 1158  BP: (!) 150/56  Pulse: 76  Resp: 20  Temp: 98 F (36.7 C)  SpO2: 99%    Physical Exam Vitals and nursing note reviewed.  Constitutional:      General: Awake and alert. No acute distress.    Appearance: Normal appearance. The patient is normal weight.  HENT:     Head: Normocephalic and atraumatic.  No ecchymosis or hematoma or laceration or other evidence of trauma to head    Mouth: Mucous membranes are moist.  Eyes:     General: PERRL. Normal EOMs        Right eye: No discharge.        Left eye: No discharge.     Conjunctiva/sclera:  Conjunctivae normal.  Cardiovascular:     Rate and Rhythm: Normal rate and regular rhythm.     Pulses: Normal pulses.  Pulmonary:     Effort: Pulmonary effort is normal. No respiratory distress.     Breath sounds: Normal breath sounds.  No chest wall tenderness or ecchymosis Abdominal:     Abdomen is soft. There is no abdominal tenderness. No rebound or guarding. No distention.  No ecchymosis Musculoskeletal:        General: No swelling. Normal range of motion.     Cervical back: Normal range of motion and neck supple. No midline cervical spine tenderness.  Full range of motion of neck.  Negative Spurling test.  Negative Lhermitte sign.  Normal strength and sensation in bilateral upper extremities. Normal grip strength bilaterally.  Normal intrinsic muscle function of the hand bilaterally.  Normal radial pulses bilaterally. Left knee: no deformity or rash.  Inferio-medial joint line tenderness. No patellar tenderness, no ballotment Warm and well perfused extremity with 2+ pedal pulses 5/5 strength to dorsiflexion and plantarflexion at the ankle with intact sensation throughout extremity Normal range of motion of the knee, with intact flexion and extension to active and passive range of motion. Extensor mechanism intact. No ligamentous laxity. Negative anterior/posterior drawer/negative lachman, negative mcmurrays No effusion or warmth Intact quadriceps, hamstring function, patellar tendon function Pelvis stable Full ROM of ankle without pain or swelling Foot warm and well perfused No midline vertebral tenderness.  Pelvis stable.  Able to actively lift both legs up off of the stretcher.  Negative logroll of hip bilaterally. Skin:    General: Skin is warm and dry.     Capillary Refill: Capillary refill takes less than 2 seconds.     Findings: Superficial abrasions noted to upper back, bandaged and clean.  There is no surrounding ecchymosis or erythema.  There is no tenderness to palpation of  this area..  Neurological:     Mental Status: The patient is awake and alert.   Neurological: GCS 15 alert and oriented x3 Normal speech, no expressive or receptive aphasia or dysarthria Cranial nerves II through XII intact Normal visual fields 5 out of 5 strength in all 4 extremities with intact sensation throughout No extremity drift Normal finger-to-nose testing, no limb or truncal ataxia    ED Results / Procedures / Treatments   Labs (all labs ordered are listed, but only abnormal results are displayed) Labs Reviewed - No data to display   EKG     RADIOLOGY     PROCEDURES:  Critical Care performed:   Procedures  MEDICATIONS ORDERED IN ED: Medications  lidocaine (LIDODERM) 5 % 1 patch (1 patch Transdermal Patch Applied 05/05/23 1403)  acetaminophen (TYLENOL) tablet 650 mg (650 mg Oral Given 05/05/23 1403)     IMPRESSION / MDM / ASSESSMENT AND PLAN / ED COURSE  I reviewed the triage vital signs and the nursing notes.   Differential diagnosis includes, but is not limited to, contusion, ligamental injury, fracture.  Patient is awake and alert, hemodynamically stable neurovascularly and neurologically intact.  Is a GCS of 15 and is answering all questions appropriately.  His daughter is with him reports that he has been acting his normal self.  CT head and neck obtained given the mechanism of injury and his age, this was negative for any acute findings.  He has no vertebral tenderness, do not suspect compression fracture.  His pelvis is stable, he is able to actively and passively range bilateral hips, I do not suspect hip fracture.  He is able to ambulate with a steady gait unassisted.  He has tenderness to his left medial aspect of his knee only, though there is no joint effusion, ecchymosis, erythema, or other abnormal findings.  He has full active and passive range of motion of his knee, and I do not appreciate any ligamental laxity.  There is no warmth erythema  or effusion to suggest septic joint.  X-ray was obtained of his knee and is negative for any acute findings.  There is no chest wall tenderness or ecchymosis, no chest wall tenderness or shortness of breath, I do not suspect rib fracture or pneumothorax.  His abdomen is soft nontender throughout, no ecchymosis, I do not suspect intra-abdominal injury.  He has full normal range of motion of his upper extremities, normal grip strength bilaterally, no cervical spine tenderness, not consistent with central cord syndrome.  CT head and neck are negative for any acute findings.  Patient was treated symptomatically with a Lidoderm patch and Tylenol which provided good relief.  He was given an Ace wrap to use during the day, advised remove it at night so that he does not increase his risk for developing blood clots.  We discussed all findings and recommendations.  Daughter and patient are both reassured by his findings today.  We discussed return precautions and importance of close outpatient follow-up.  Patient and daughter understand agree with plan.  He was discharged in stable condition.   Patient's presentation is most consistent with acute complicated illness / injury requiring diagnostic workup.    FINAL CLINICAL IMPRESSION(S) / ED DIAGNOSES   Final diagnoses:  Fall, initial encounter  Contusion of left knee, initial encounter     Rx / DC Orders   ED Discharge Orders     None        Note:  This document was prepared using Dragon voice recognition software and may include unintentional dictation errors.   Keturah Shavers 05/05/23 1522    Jene Every, MD 05/05/23 [phone]

## 2023-05-06 ENCOUNTER — Other Ambulatory Visit: Payer: Self-pay | Admitting: Internal Medicine

## 2023-05-09 ENCOUNTER — Telehealth: Payer: Self-pay

## 2023-05-09 NOTE — Telephone Encounter (Signed)
Transition Care Management Follow-up Telephone Call Date of discharge and from where: [facility] 6/1 How have you been since you were released from the hospital? Doing great  Any questions or concerns? No  Items Reviewed: Did the pt receive and understand the discharge instructions provided? Yes  Medications obtained and verified? Yes  Other? No  Any new allergies since your discharge? No  Dietary orders reviewed? No Do you have support at home? Yes   Follow up appointments reviewed:  PCP Hospital f/u appt confirmed? Yes  Scheduled to see over the phone  on  @ . Specialist Hospital f/u appt confirmed? No  Scheduled to see  on  @ . Are transportation arrangements needed? No  If their condition worsens, is the pt aware to call PCP or go to the Emergency Dept.? Yes Was the patient provided with contact information for the PCP's office or ED? Yes Was to pt encouraged to call back with questions or concerns? Yes

## 2023-05-10 ENCOUNTER — Encounter: Payer: Self-pay | Admitting: Podiatry

## 2023-05-10 ENCOUNTER — Ambulatory Visit: Payer: Medicare Other | Admitting: Podiatry

## 2023-05-10 VITALS — BP 163/72

## 2023-05-10 DIAGNOSIS — M79675 Pain in left toe(s): Secondary | ICD-10-CM | POA: Diagnosis not present

## 2023-05-10 DIAGNOSIS — B351 Tinea unguium: Secondary | ICD-10-CM | POA: Diagnosis not present

## 2023-05-10 DIAGNOSIS — I739 Peripheral vascular disease, unspecified: Secondary | ICD-10-CM

## 2023-05-10 DIAGNOSIS — M79674 Pain in right toe(s): Secondary | ICD-10-CM

## 2023-05-10 NOTE — Progress Notes (Signed)
  Subjective:  Patient ID: David Atkins, male    DOB: 12/24/1928,  MRN: [id number]  Gregorio Gantz presents to clinic today for at risk foot care. Patient has h/o PAD and painful elongated mycotic toenails 1-5 bilaterally which are tender when wearing enclosed shoe gear. Pain is relieved with periodic professional debridement. He is accompanied by his daughter on today's visit. Patient lives with daughter Chief Complaint  Patient presents with   Nail Problem    RFC,Referring Provider Sherlene Shams, MD,LOv:01/24    New problem(s): None.   PCP is Sherlene Shams, MD.  Allergies  Allergen Reactions   Sulfa Antibiotics Rash   Review of Systems: Negative except as noted in the HPI.  Objective: No changes noted in today's physical examination. Vitals:   05/10/23 1433  BP: (!) 163/72   Durell Vitarelli is a pleasant 89 y.o. male WD, WN in NAD. AAO x 3.  Vascular Examination: CFT <4 seconds b/l. DP pulses diminished b/l. PT pulses diminished b/l. Digital hair absent. Skin temperature gradient warm to cool b/l. No ischemia or gangrene. No cyanosis or clubbing noted b/l. Patient wearing compression hose on today's visit. Dependent edema noted b/l LE.   Neurological Examination: Sensation grossly intact b/l with 10 gram monofilament. Vibratory sensation intact b/l.   Dermatological Examination: Pedal skin thin, shiny and atrophic b/l. No open wounds. No interdigital macerations.   Toenails 1-5 b/l thick, discolored, elongated with subungual debris and pain on dorsal palpation.   No hyperkeratotic nor porokeratotic lesions present on today's visit.  Musculoskeletal Examination: Muscle strength 5/5 to b/l LE. No pain, crepitus or joint limitation noted with ROM bilateral LE. HAV with bunion deformity noted b/l LE. Utilizes rollator for ambulation assistance.  Radiographs: None  Assessment/Plan: 1. Pain due to onychomycosis of toenails of both feet   2. PAD (peripheral artery  disease) (HCC)    -Patient's family member present. All questions/concerns addressed on today's visit. -Consent given for treatment as described below: -Examined patient. -Patient to continue soft, supportive shoe gear daily. -Toenails 1-5 b/l were debrided in length and girth with sterile nail nippers and dremel without iatrogenic bleeding.  -Patient/POA to call should there be question/concern in the interim.   Return in about 3 months (around 08/10/2023).  Freddie Breech, DPM

## 2023-05-29 ENCOUNTER — Ambulatory Visit: Payer: Self-pay

## 2023-05-29 NOTE — Patient Outreach (Signed)
  Care Coordination   Follow Up Visit Note   05/29/2023 Name: Freddie Dymek MRN: [id number] DOB: 1929/10/14  Eliab Closson is a 89 y.o. year old male who sees Darrick Huntsman, Mar Daring, MD for primary care. I spoke with  Billie Lade by phone today.  What matters to the patients health and wellness today?  Patient reports sustaining 2 falls within the past 2 months.  He reports one fall was due to fainting spell in his bathroom. He states he thinks this was related to his non epileptic seizure conditions. Patient states his second fall was due to the stool he was sitting on coming out from under him.  Patient reports being seen in the ED for the second fall. Patient states issues related to both falls have resolved.  Patient denies any symptoms related to COPD.      Goals Addressed             This Visit's Progress    "Getting in to see general surgeon" and post hospital follow up/ management       Interventions Today    Flowsheet Row Most Recent Value  Chronic Disease   Chronic disease during today's visit Chronic Obstructive Pulmonary Disease (COPD), Other  [Falls]  General Interventions   General Interventions Discussed/Reviewed General Interventions Reviewed, Doctor Visits  [evaluation of current treatment plan for health conditions and patients adherence to plan as established by provider. Assessed for COPD symptoms.]  Doctor Visits Discussed/Reviewed Doctor Visits Reviewed  Annabell Sabal scheduled/ upcoming provider visits]  Pharmacy Interventions   Pharmacy Dicussed/Reviewed Pharmacy Topics Reviewed  [medications reviewed and compliance discussed.]  Safety Interventions   Safety Discussed/Reviewed Fall Risk  [fall prevention strategies discussed. Advised to notify provider for falls.]               SDOH assessments and interventions completed:  No     Care Coordination Interventions:  Yes, provided   Follow up plan: Follow up call scheduled for 07/18/23    Encounter Outcome:   Pt. Visit Completed   George Ina RN,BSN,CCM [facility] Care Coordination [phone] direct line

## 2023-05-29 NOTE — Patient Instructions (Signed)
Visit Information  Thank you for taking time to visit with me today. Please don't hesitate to contact me if I can be of assistance to you.   Following are the goals we discussed today:   Goals Addressed             This Visit's Progress    "Getting in to see general surgeon" and post hospital follow up/ management       Interventions Today    Flowsheet Row Most Recent Value  Chronic Disease   Chronic disease during today's visit Chronic Obstructive Pulmonary Disease (COPD), Other  [Falls]  General Interventions   General Interventions Discussed/Reviewed General Interventions Reviewed, Doctor Visits  [evaluation of current treatment plan for health conditions and patients adherence to plan as established by provider. Assessed for COPD symptoms.]  Doctor Visits Discussed/Reviewed Doctor Visits Reviewed  Annabell Sabal scheduled/ upcoming provider visits]  Pharmacy Interventions   Pharmacy Dicussed/Reviewed Pharmacy Topics Reviewed  [medications reviewed and compliance discussed.]  Safety Interventions   Safety Discussed/Reviewed Fall Risk  [fall prevention strategies discussed. Advised to notify provider for falls.]               Our next appointment is by telephone on 07/18/23 at 2 pm  Please call the care guide team at [phone] if you need to cancel or reschedule your appointment.   If you are experiencing a Mental Health or Behavioral Health Crisis or need someone to talk to, please call the Suicide and Crisis Lifeline: 988 call 1-800-273-TALK (toll free, 24 hour hotline)  Patient verbalizes understanding of instructions and care plan provided today and agrees to view in MyChart. Active MyChart status and patient understanding of how to access instructions and care plan via MyChart confirmed with patient.     George Ina RN,BSN,CCM [facility] Care Coordination [phone] direct line

## 2023-05-30 ENCOUNTER — Other Ambulatory Visit: Payer: Self-pay | Admitting: Internal Medicine

## 2023-05-30 DIAGNOSIS — I10 Essential (primary) hypertension: Secondary | ICD-10-CM | POA: Diagnosis not present

## 2023-05-30 DIAGNOSIS — M6281 Muscle weakness (generalized): Secondary | ICD-10-CM | POA: Diagnosis not present

## 2023-06-01 ENCOUNTER — Telehealth: Payer: Self-pay | Admitting: Internal Medicine

## 2023-06-01 MED ORDER — LIDOCAINE 5 % EX PTCH: 1.0000 | MEDICATED_PATCH | CUTANEOUS | 1 refills | Status: DC

## 2023-06-01 NOTE — Telephone Encounter (Signed)
He may have a broken rib  if he develops shortness of breath he needs to go to ER , NOT urgent cAre.  Lidocaine patches sent in to [facility]
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
## 2023-06-01 NOTE — Telephone Encounter (Signed)
John the son called stating the pt fell in the garage on wed and has an episode of unconsciousness. John stated the pt hurt his leg and ribs on the left side and has not been anywhere for the injuries. Pt son wanted to make an appointment for pt ASAP John #[phone]

## 2023-06-01 NOTE — Telephone Encounter (Signed)
Pt's wife is aware and gave a verbal understanding.

## 2023-06-04 ENCOUNTER — Ambulatory Visit (INDEPENDENT_AMBULATORY_CARE_PROVIDER_SITE_OTHER): Payer: Medicare Other

## 2023-06-04 ENCOUNTER — Encounter: Payer: Self-pay | Admitting: Family

## 2023-06-04 ENCOUNTER — Other Ambulatory Visit: Payer: Self-pay | Admitting: Family

## 2023-06-04 ENCOUNTER — Ambulatory Visit (INDEPENDENT_AMBULATORY_CARE_PROVIDER_SITE_OTHER): Payer: Medicare Other | Admitting: Family

## 2023-06-04 ENCOUNTER — Other Ambulatory Visit: Payer: Medicare Other

## 2023-06-04 VITALS — BP 128/84 | HR 74 | Temp 98.7°F | Ht 66.0 in | Wt 180.2 lb

## 2023-06-04 DIAGNOSIS — N3281 Overactive bladder: Secondary | ICD-10-CM | POA: Diagnosis not present

## 2023-06-04 DIAGNOSIS — R0781 Pleurodynia: Secondary | ICD-10-CM | POA: Diagnosis not present

## 2023-06-04 DIAGNOSIS — R55 Syncope and collapse: Secondary | ICD-10-CM | POA: Diagnosis not present

## 2023-06-04 DIAGNOSIS — Z043 Encounter for examination and observation following other accident: Secondary | ICD-10-CM | POA: Diagnosis not present

## 2023-06-04 DIAGNOSIS — R600 Localized edema: Secondary | ICD-10-CM

## 2023-06-04 DIAGNOSIS — M1611 Unilateral primary osteoarthritis, right hip: Secondary | ICD-10-CM | POA: Diagnosis not present

## 2023-06-04 DIAGNOSIS — S2231XD Fracture of one rib, right side, subsequent encounter for fracture with routine healing: Secondary | ICD-10-CM | POA: Diagnosis not present

## 2023-06-04 DIAGNOSIS — I700 Atherosclerosis of aorta: Secondary | ICD-10-CM | POA: Diagnosis not present

## 2023-06-04 NOTE — Progress Notes (Signed)
Assessment & Plan:  Rib pain on right side Assessment & Plan: Pending xray to evaluate for fracture.   Orders: -     DG Chest 2 View; Future -     CBC with Differential/Platelet -     Comprehensive metabolic panel -     TSH -     DG HIP UNILAT W OR W/O PELVIS 1V RIGHT; Future -     Brain natriuretic peptide  Syncope and collapse Assessment & Plan: Patient well appearing today . Question if vasovagal syncope with prodromal symptoms and if deconditioning and poor oral intake aggravated.   Zio completed  11/02/22 , no secondary arrhythmia noted. He is not orthostatic on exam today 128/84, HR 74 sitting 120/70, HR 82 standing Note: he is unable to get on exam table.  In the absence of headache or confusion, patient politely declines neuroimaging at this time which I think is reasonable.  He also declines physical therapy consult. He is scheduled to see neurology , Dr Malvin Johns, 07/2023 as previously referred by Dr Darrick Huntsman to evaluate for seizure.  Pending Xray rib, chest and right hip to evaluate for fracture.     Bilateral leg edema Assessment & Plan: Symmetric. Denies orthopnea and no known h/o CHF.  BNP lab obtained which was not significantly elevated. Symptom more likely to be venous insufficiently and we discussed diuretic may lead to further dehydration, falls, dizziness. Reiterated the importance of compression stockings, low-sodium diet and elevation.  Patient will let me know how he is doing.   OAB (overactive bladder) Assessment & Plan: Discussed trial stop oxybutynin due to concern for side effect of drowsiness. Advised worthwhile to discuss alternatives.  Patient will consider this and further discuss with PCP      Return precautions given.   Risks, benefits, and alternatives of the medications and treatment plan prescribed today were discussed, and patient expressed understanding.   Education regarding symptom management and diagnosis given to patient on AVS either  electronically or printed.  No follow-ups on file.  Rennie Plowman, FNP  Subjective:    Patient ID: David Atkins, male    DOB: 1929/09/03, 89 y.o.   MRN: [id number]  CC: David Atkins is a 89 y.o. male who presents today for an acute visit.    HPI: Accompanied by son  Complains of  right chest wall tenderness which brings him in today. He describes episode syncopal episode in which he fell 6 days ago. He works in [employer] working and was in the shed at this time. He was alone.   He felt dizziness 'coming on' and he 'tried to control the fall'. Subsequently, he fell on saw equipment on right side rib cage and right hip. He felt weak and head was stooping over prior to fall.  He was there for 2 hours until caregiver came home.   He has felt 'weak' since this the fall.      Endorses LOC.   He doesn't recall hitting his head. He regained consciousness 'quickly'.   He takes a 81mg  ASA  Syncope was not a/w diarrhea,  prodromal tunnel vision, CP, numbness, HA, vision changes.After syncope, no associated fecal or urinary incontinence, HA, drowsiness, confusion   Since fall  he denies pain with deep breathing, abdominal pain, fever, dysuria, sob, ecchymosis, HA , vision changes.   He was in shed without AC.He endorses not drinking enough water  Appetite is good. Right chest wall pain relieved with lidocaine pain patch.    He  describes a problem for 'years' in lower legs getting 'weak'. He declines PT.    History of atherosclerosis, coronary artery disease, chronic diastolic CHF, hypertension, OSA, GERD, osteoporosis, seizure.   He was seen 05/05/2023 for fall, presented to the emergency room.  Given lidocaine 5% patch. CT head and neck obtained negative for acute findings.  GFR 60  Cholecystectomy 03/13/2023  Follow-up Dr. Mariah Milling 01/22/2023 coronary artery disease Discussed history of vasovagal near syncope associated with diarrhea spells    echocardiogram normal ejection  fraction, ejection fraction 60% 09/27/22    zio completed  11/02/22 , no secondary arrhythmia noted  Chronic leg swelling. He wears compression stockings.   Denies orthopnea  He has constipation. Compliant with oxybutynin.   OSA- compliant with cipap  MRI brain 11/08/2022 obtained for new onset seizure.  No evidence of acute intracranial abnormality.  Chronic lacunar infarcts, chronic small vessel ischemic changes progressed from prior MRI 01/02/2018.  Chronic microhemorrhages  Reviewed [place] note 10/24/22 regarding 2 syncopal events when he was wearing zio  monitor in which accompanied with fecal incontinence.  Referral to Dr.Shah ( I do not see visit)  He is not driving. He uses walker.    Allergies: Sulfa antibiotics Current Outpatient Medications on File Prior to Visit  Medication Sig Dispense Refill   amLODipine (NORVASC) 2.5 MG tablet TAKE 1 TABLET BY MOUTH DAILY (Patient taking differently: Take 2.5 mg by mouth at bedtime.) 100 tablet 2   aspirin 81 MG EC tablet Take 81 mg by mouth daily.      atorvastatin (LIPITOR) 20 MG tablet Take 1 tablet (20 mg total) by mouth daily. (Patient taking differently: Take 20 mg by mouth at bedtime.) 90 tablet 3   Blood Pressure Monitoring (BLOOD PRESSURE MONITOR AUTOMAT) DEVI Use daily to check blood pressure 1 Device 0   cholecalciferol (VITAMIN D) 25 MCG (1000 UNIT) tablet Take 1,000 Units by mouth daily.     finasteride (PROSCAR) 5 MG tablet TAKE 1 TABLET BY MOUTH DAILY 100 tablet 2   KLOR-CON M20 20 MEQ tablet TAKE 1 TABLET (20 MEQ TOTAL) BY MOUTH 2 (TWO) TIMES DAILY. WITH FOOD 4 tablet 0   lidocaine (LIDODERM) 5 % Place 1 patch onto the skin daily. Remove & Discard patch within 12 hours or as directed by MD 30 patch 1   lisinopril (ZESTRIL) 20 MG tablet TAKE 1 TABLET BY MOUTH DAILY (Patient taking differently: Take 20 mg by mouth every morning. TAKE 1 TABLET BY MOUTH  DAILY) 100 tablet 2   Menthol-Camphor (TIGER BALM ARTHRITIS RUB EX)  Apply 1 Application topically as needed.     metoprolol tartrate (LOPRESSOR) 25 MG tablet TAKE ONE-HALF TABLET BY MOUTH  TWICE DAILY 100 tablet 1   nitroGLYCERIN (NITROSTAT) 0.4 MG SL tablet Place 1 tablet (0.4 mg total) under the tongue every 5 (five) minutes as needed for chest pain. Maximum of 3 doses. 25 tablet 3   omeprazole (PRILOSEC) 20 MG capsule TAKE 2 CAPSULES BY MOUTH DAILY (Patient taking differently: 20 mg 2 (two) times daily before a meal.) 200 capsule 2   oxybutynin (DITROPAN) 5 MG tablet TAKE 1 TABLET BY MOUTH TWICE  DAILY (Patient taking differently: Take 5 mg by mouth 2 (two) times daily.) 200 tablet 2   polyethylene glycol powder (GLYCOLAX/MIRALAX) powder Take 17 g by mouth once. Dissolve 1 capful of power into any liquid and drink once daily (Patient taking differently: Take 17 g by mouth at bedtime. Dissolve 1 capful of power into any  liquid and drink once daily) 500 g 0   rOPINIRole (REQUIP) 0.25 MG tablet TAKE 1 TABLET BY MOUTH AT  BEDTIME (Patient taking differently: Take 0.25 mg by mouth at bedtime.) 100 tablet 2   Simethicone Extra Strength 125 MG CAPS Take 125 mg by mouth as needed (gas).     Syringe/Needle, Disp, (SYRINGE 3CC/25GX1") 25G X 1" 3 ML MISC Use for b12 injections 50 each 0   tamsulosin (FLOMAX) 0.4 MG CAPS capsule TAKE 1 CAPSULE BY MOUTH DAILY  AFTER BREAKFAST (Patient taking differently: 0.4 mg daily after breakfast. TAKE 1 CAPSULE BY MOUTH  DAILY AFTER BREAKFAST) 100 capsule 2   No current facility-administered medications on file prior to visit.    Review of Systems  Constitutional:  Negative for chills and fever.  Respiratory:  Negative for cough and shortness of breath.   Cardiovascular:  Positive for leg swelling. Negative for chest pain and palpitations.  Gastrointestinal:  Negative for nausea and vomiting.      Objective:    BP 128/84   Pulse 74   Temp 98.7 F (37.1 C) (Oral)   Ht 5\' 6"  (1.676 m)   Wt 180 lb 3.2 oz (81.7 kg)   SpO2 97%    BMI 29.09 kg/m   BP Readings from Last 3 Encounters:  06/04/23 128/84  05/10/23 (!) 163/72  05/05/23 (!) 150/56   Wt Readings from Last 3 Encounters:  06/04/23 180 lb 3.2 oz (81.7 kg)  05/05/23 175 lb (79.4 kg)  03/20/23 175 lb 6.4 oz (79.6 kg)      Physical Exam Vitals reviewed.  Constitutional:      Appearance: He is well-developed.  HENT:     Head:     Comments: No head laceration, ecchymosis Cardiovascular:     Rate and Rhythm: Regular rhythm.     Heart sounds: Normal heart sounds.     Comments: +1 BLE edema. No palpable cords or masses. No erythema or increased warmth. No asymmetry in calf size when compared bilaterally LE hair growth symmetric and present. No discoloration or varicosities noted. LE warm and palpable pedal pulses.  Pulmonary:     Effort: Pulmonary effort is normal. No respiratory distress.     Breath sounds: Normal breath sounds. No wheezing, rhonchi or rales.  Chest:     Chest wall: Swelling present. No tenderness or edema. There is no dullness to percussion.     Comments: No chest pain with deep inspiration.  Tenderness with palpation right side rib cage.  Ecchymosis present.  No edema, bony step-off. Abdominal:     Comments: Abdomen is soft, nondistended  Musculoskeletal:     Right lower leg: 1+ Edema present.     Left lower leg: 1+ Edema present.  Skin:    General: Skin is warm and dry.  Neurological:     Mental Status: He is alert.  Psychiatric:        Speech: Speech normal.        Behavior: Behavior normal.   I have spent 40 minutes with a patient including precharting, exam, reviewing medical records, and discussion plan of care.

## 2023-06-04 NOTE — Patient Instructions (Addendum)
I would recommend a trial stop of oxybutynin due to constipation and this medication can cause drowsiness, confusion. However if you prefer to discuss with Dr Darrick Huntsman, that is reasonable as well however I wanted to point this out to you.   Continue lidocaine patch to right rib side.   Continue compression stockings , low sodium diet and elevation for leg swelling.   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

## 2023-06-04 NOTE — Assessment & Plan Note (Addendum)
Patient well appearing today . Question if vasovagal syncope with prodromal symptoms and if deconditioning and poor oral intake aggravated.   Zio completed  11/02/22 , no secondary arrhythmia noted. He is not orthostatic on exam today 128/84, HR 74 sitting 120/70, HR 82 standing Note: he is unable to get on exam table.  In the absence of headache or confusion, patient politely declines neuroimaging at this time which I think is reasonable.  He also declines physical therapy consult. He is scheduled to see neurology , Dr Malvin Johns, 07/2023 as previously referred by Dr Darrick Huntsman to evaluate for seizure.  Pending Xray rib, chest and right hip to evaluate for fracture.

## 2023-06-05 LAB — CBC WITH DIFFERENTIAL/PLATELET
Basophils Absolute: 0 10*3/uL (ref 0.0–0.1)
Basophils Relative: 0.4 % (ref 0.0–3.0)
Eosinophils Absolute: 0.1 10*3/uL (ref 0.0–0.7)
Eosinophils Relative: 1.3 % (ref 0.0–5.0)
HCT: 40.9 % (ref 39.0–52.0)
Hemoglobin: 13.4 g/dL (ref 13.0–17.0)
Lymphocytes Relative: 19.3 % (ref 12.0–46.0)
Lymphs Abs: 1.7 10*3/uL (ref 0.7–4.0)
MCHC: 32.7 g/dL (ref 30.0–36.0)
MCV: 98.7 fl (ref 78.0–100.0)
Monocytes Absolute: 0.7 10*3/uL (ref 0.1–1.0)
Monocytes Relative: 7.7 % (ref 3.0–12.0)
Neutro Abs: 6.4 10*3/uL (ref 1.4–7.7)
Neutrophils Relative %: 71.3 % (ref 43.0–77.0)
Platelets: 256 10*3/uL (ref 150.0–400.0)
RBC: 4.14 Mil/uL — ABNORMAL LOW (ref 4.22–5.81)
RDW: 13.9 % (ref 11.5–15.5)
WBC: 8.9 10*3/uL (ref 4.0–10.5)

## 2023-06-05 LAB — COMPREHENSIVE METABOLIC PANEL
ALT: 24 U/L (ref 0–53)
AST: 26 U/L (ref 0–37)
Albumin: 3.5 g/dL (ref 3.5–5.2)
Alkaline Phosphatase: 67 U/L (ref 39–117)
BUN: 15 mg/dL (ref 6–23)
CO2: 25 mEq/L (ref 19–32)
Calcium: 8.8 mg/dL (ref 8.4–10.5)
Chloride: 106 mEq/L (ref 96–112)
Creatinine, Ser: 0.7 mg/dL (ref 0.40–1.50)
GFR: 80.23 mL/min (ref >60.00)
Glucose, Bld: 115 mg/dL — ABNORMAL HIGH (ref 70–99)
Potassium: 3.5 mEq/L (ref 3.5–5.1)
Sodium: 141 mEq/L (ref 135–145)
Total Bilirubin: 1 mg/dL (ref 0.2–1.2)
Total Protein: 6.5 g/dL (ref 6.0–8.3)

## 2023-06-05 LAB — TSH: TSH: 1.7 u[IU]/mL (ref 0.35–5.50)

## 2023-06-05 LAB — BRAIN NATRIURETIC PEPTIDE: BNP: 122.5 pg/mL — ABNORMAL HIGH (ref 0.0–100.0)

## 2023-06-06 ENCOUNTER — Encounter: Payer: Self-pay | Admitting: Internal Medicine

## 2023-06-06 ENCOUNTER — Telehealth: Payer: Self-pay

## 2023-06-06 DIAGNOSIS — R350 Frequency of micturition: Secondary | ICD-10-CM | POA: Diagnosis not present

## 2023-06-06 NOTE — Telephone Encounter (Signed)
*  Primary  PA request received for Lidocaine 5% patches  Medication is not covered without diagnosis of Shingles, Cancer and few other.  Patient may get 4% patches OTC if appropriate  Key: [id number]

## 2023-06-06 NOTE — Telephone Encounter (Signed)
Spoke with pt and he stated that he has already picked up the rx and they are working great.

## 2023-06-08 ENCOUNTER — Encounter: Payer: Self-pay | Admitting: Internal Medicine

## 2023-06-08 NOTE — Telephone Encounter (Signed)
PA has been DENIED, patient may pay out of pocket if he wishes.

## 2023-06-11 ENCOUNTER — Telehealth: Payer: Self-pay | Admitting: Family

## 2023-06-11 NOTE — Telephone Encounter (Signed)
David Atkins What is status of neurology referral to dr Sherryll Burger which  dr Darrick Huntsman placed 10/2022?   Is new referral needed?

## 2023-06-11 NOTE — Assessment & Plan Note (Signed)
Symmetric. Denies orthopnea and no known h/o CHF.  BNP lab obtained which was not significantly elevated. Symptom more likely to be venous insufficiently and we discussed diuretic may lead to further dehydration, falls, dizziness. Reiterated the importance of compression stockings, low-sodium diet and elevation.  Patient will let me know how he is doing.

## 2023-06-11 NOTE — Assessment & Plan Note (Addendum)
Discussed trial stop ( or decrease) of oxybutynin 5mg  BID due to concern for side effect of drowsiness. Advised worthwhile to discuss alternatives.  Patient will consider this and further discuss with PCP

## 2023-06-11 NOTE — Assessment & Plan Note (Signed)
Pending xray to evaluate for fracture.

## 2023-06-29 DIAGNOSIS — I10 Essential (primary) hypertension: Secondary | ICD-10-CM | POA: Diagnosis not present

## 2023-06-29 DIAGNOSIS — M6281 Muscle weakness (generalized): Secondary | ICD-10-CM | POA: Diagnosis not present

## 2023-07-05 DIAGNOSIS — R296 Repeated falls: Secondary | ICD-10-CM | POA: Diagnosis not present

## 2023-07-05 DIAGNOSIS — R531 Weakness: Secondary | ICD-10-CM | POA: Diagnosis not present

## 2023-07-05 DIAGNOSIS — R55 Syncope and collapse: Secondary | ICD-10-CM | POA: Diagnosis not present

## 2023-07-05 DIAGNOSIS — R251 Tremor, unspecified: Secondary | ICD-10-CM | POA: Diagnosis not present

## 2023-07-05 DIAGNOSIS — R202 Paresthesia of skin: Secondary | ICD-10-CM | POA: Diagnosis not present

## 2023-07-05 DIAGNOSIS — R200 Anesthesia of skin: Secondary | ICD-10-CM | POA: Diagnosis not present

## 2023-07-08 ENCOUNTER — Other Ambulatory Visit: Payer: Self-pay | Admitting: Internal Medicine

## 2023-07-18 ENCOUNTER — Ambulatory Visit: Payer: Self-pay

## 2023-07-18 NOTE — Patient Outreach (Signed)
Care Coordination   Follow Up Visit Note   07/18/2023 Name: David Atkins MRN: [id number] DOB: 1929-07-27  David Atkins is a 89 y.o. year old male who sees Darrick Huntsman, Mar Daring, MD for primary care. I spoke with  Billie Lade by phone today.  What matters to the patients health and wellness today?   Patient reports having recent follow up visit with primary care provider on 06/04/23. Per chart review xrays done for rib/ chest/ and hip fracture rule out.  Patient states xrays showed he had 2 fractured ribs.  Patient states he is doing much better and denies any pain from rib fractures.  Patient denies any falls since visit with primary provider on 06/04/23.   Patient states he is wearing his compression hose and tries to elevate his feet when sitting. He states he continues to adhere to a low salt diet.   Patient reports having new patient visit with neurologist on 07/05/23.  He states he is scheduled for two "brain" test, one in 08/2023 and one in 09/2023.  Patient states he is scheduled to follow up with the neurologist on 09/26/23.  Patient states his appetite has been good.      Goals Addressed             This Visit's Progress    COMPLETED: "Getting in to see general surgeon" and post hospital follow up/ management       Interventions Today    Flowsheet Row Most Recent Value  Chronic Disease   Chronic disease during today's visit Chronic Obstructive Pulmonary Disease (COPD), Other  [Falls]  General Interventions   General Interventions Discussed/Reviewed General Interventions Reviewed, Doctor Visits  [evaluation of current treatment plan for health conditions and patients adherence to plan as established by provider. Assessed for COPD symptoms.]  Doctor Visits Discussed/Reviewed Doctor Visits Reviewed  Annabell Sabal scheduled/ upcoming provider visits]  Pharmacy Interventions   Pharmacy Dicussed/Reviewed Pharmacy Topics Reviewed  [medications reviewed and compliance discussed.]  Safety  Interventions   Safety Discussed/Reviewed Fall Risk  [fall prevention strategies discussed. Advised to notify provider for falls.]            Management of chronic health conditions and falls       Interventions Today    Flowsheet Row Most Recent Value  Chronic Disease   Chronic disease during today's visit Chronic Obstructive Pulmonary Disease (COPD), Other  [falls, fractured ribs, syncope and collapse]  General Interventions   General Interventions Discussed/Reviewed General Interventions Reviewed, Doctor Visits  [evaluation of treatment plan for COPD, falls, fractured ribs and syncope and collapse and patients adherence to plan as established by provider.  Assessed for COPD symptoms, pain level regarding fractured ribs and syncope / collapse events.]  Doctor Visits Discussed/Reviewed Doctor Visits Reviewed  [reviewed scheduled/ upcoming provider visits. Advised to keep follow up appointments with providers.]  Education Interventions   Education Provided Provided Education  [reinforced provider recommendations to wear compression hose, follow low salt diet and elevate feet/legs when sitting or lying down. Advised to notify provider of any new or ongoing symptoms.]  Nutrition Interventions   Nutrition Discussed/Reviewed Nutrition Reviewed  [assessed for appetite]  Pharmacy Interventions   Pharmacy Dicussed/Reviewed Pharmacy Topics Reviewed  [reviewed medications.  Advised to take medications as prescribed.]  Safety Interventions   Safety Discussed/Reviewed Fall Risk  [fall prevention discussed.]              SDOH assessments and interventions completed:  No     Care Coordination Interventions:  Yes, provided   Follow up plan: Follow up call scheduled for 08/23/23    Encounter Outcome:  Pt. Visit Completed   George Ina RN,BSN,CCM [facility] Care Coordination [phone] direct line

## 2023-07-18 NOTE — Patient Instructions (Signed)
Visit Information  Thank you for taking time to visit with me today. Please don't hesitate to contact me if I can be of assistance to you.   Following are the goals we discussed today:   Goals Addressed             This Visit's Progress    COMPLETED: "Getting in to see general surgeon" and post hospital follow up/ management       Interventions Today    Flowsheet Row Most Recent Value  Chronic Disease   Chronic disease during today's visit Chronic Obstructive Pulmonary Disease (COPD), Other  [Falls]  General Interventions   General Interventions Discussed/Reviewed General Interventions Reviewed, Doctor Visits  [evaluation of current treatment plan for health conditions and patients adherence to plan as established by provider. Assessed for COPD symptoms.]  Doctor Visits Discussed/Reviewed Doctor Visits Reviewed  Annabell Sabal scheduled/ upcoming provider visits]  Pharmacy Interventions   Pharmacy Dicussed/Reviewed Pharmacy Topics Reviewed  [medications reviewed and compliance discussed.]  Safety Interventions   Safety Discussed/Reviewed Fall Risk  [fall prevention strategies discussed. Advised to notify provider for falls.]            Management of chronic health conditions and falls       Interventions Today    Flowsheet Row Most Recent Value  Chronic Disease   Chronic disease during today's visit Chronic Obstructive Pulmonary Disease (COPD), Other  [falls, fractured ribs, syncope and collapse]  General Interventions   General Interventions Discussed/Reviewed General Interventions Reviewed, Doctor Visits  [evaluation of treatment plan for COPD, falls, fractured ribs and syncope and collapse and patients adherence to plan as established by provider.  Assessed for COPD symptoms, pain level regarding fractured ribs and syncope / collapse events.]  Doctor Visits Discussed/Reviewed Doctor Visits Reviewed  [reviewed scheduled/ upcoming provider visits. Advised to keep follow up  appointments with providers.]  Education Interventions   Education Provided Provided Education  [reinforced provider recommendations to wear compression hose, follow low salt diet and elevate feet/legs when sitting or lying down. Advised to notify provider of any new or ongoing symptoms.]  Nutrition Interventions   Nutrition Discussed/Reviewed Nutrition Reviewed  [assessed for appetite]  Pharmacy Interventions   Pharmacy Dicussed/Reviewed Pharmacy Topics Reviewed  [reviewed medications.  Advised to take medications as prescribed.]  Safety Interventions   Safety Discussed/Reviewed Fall Risk  [fall prevention discussed.]              Our next appointment is by telephone on 08/23/23 at 2 pm  Please call the care guide team at [phone] if you need to cancel or reschedule your appointment.   If you are experiencing a Mental Health or Behavioral Health Crisis or need someone to talk to, please call the Suicide and Crisis Lifeline: 988 call 1-800-273-TALK (toll free, 24 hour hotline)  Patient verbalizes understanding of instructions and care plan provided today and agrees to view in MyChart. Active MyChart status and patient understanding of how to access instructions and care plan via MyChart confirmed with patient.     George Ina RN,BSN,CCM [facility] Care Coordination [phone] direct line

## 2023-07-24 ENCOUNTER — Ambulatory Visit (INDEPENDENT_AMBULATORY_CARE_PROVIDER_SITE_OTHER): Payer: Medicare Other | Admitting: *Deleted

## 2023-07-24 VITALS — Ht 66.0 in | Wt 170.0 lb

## 2023-07-24 DIAGNOSIS — Z0000 Encounter for general adult medical examination without abnormal findings: Secondary | ICD-10-CM

## 2023-07-24 NOTE — Patient Instructions (Signed)
[name] , Thank you for taking time to come for your Medicare Wellness Visit. I appreciate your ongoing commitment to your health goals. Please review the following plan we discussed and let me know if I can assist you in the future.   Referrals/Orders/Follow-Ups/Clinician Recommendations: None  This is a list of the screening recommended for you and due dates:  Health Maintenance  Topic Date Due   Zoster (Shingles) Vaccine (2 of 2) 11/06/2019   COVID-19 Vaccine (6 - 2023-24 season) 11/02/2022   Flu Shot  07/05/2023   Medicare Annual Wellness Visit  07/23/2024   DTaP/Tdap/Td vaccine (4 - Td or Tdap) 08/16/2027   Pneumonia Vaccine  Completed   HPV Vaccine  Aged Out    Advanced directives: (Copy Requested) Please bring a copy of your health care power of attorney and living will to the office to be added to your chart at your convenience.  Next Medicare Annual Wellness Visit scheduled for next year: Yes 07/28/24 @ 12:45  Preventive Care 65 Years and Older, Male  Preventive care refers to lifestyle choices and visits with your health care provider that can promote health and wellness. What does preventive care include? A yearly physical exam. This is also called an annual well check. Dental exams once or twice a year. Routine eye exams. Ask your health care provider how often you should have your eyes checked. Personal lifestyle choices, including: Daily care of your teeth and gums. Regular physical activity. Eating a healthy diet. Avoiding tobacco and drug use. Limiting alcohol use. Practicing safe sex. Taking low doses of aspirin every day. Taking vitamin and mineral supplements as recommended by your health care provider. What happens during an annual well check? The services and screenings done by your health care provider during your annual well check will depend on your age, overall health, lifestyle risk factors, and family history of disease. Counseling  Your health  care provider may ask you questions about your: Alcohol use. Tobacco use. Drug use. Emotional well-being. Home and relationship well-being. Sexual activity. Eating habits. History of falls. Memory and ability to understand (cognition). Work and work Astronomer. Screening  You may have the following tests or measurements: Height, weight, and BMI. Blood pressure. Lipid and cholesterol levels. These may be checked every 5 years, or more frequently if you are over 84 years old. Skin check. Lung cancer screening. You may have this screening every year starting at age 8 if you have a 30-pack-year history of smoking and currently smoke or have quit within the past 15 years. Fecal occult blood test (FOBT) of the stool. You may have this test every year starting at age 68. Flexible sigmoidoscopy or colonoscopy. You may have a sigmoidoscopy every 5 years or a colonoscopy every 10 years starting at age 70. Prostate cancer screening. Recommendations will vary depending on your family history and other risks. Hepatitis C blood test. Hepatitis B blood test. Sexually transmitted disease (STD) testing. Diabetes screening. This is done by checking your blood sugar (glucose) after you have not eaten for a while (fasting). You may have this done every 1-3 years. Abdominal aortic aneurysm (AAA) screening. You may need this if you are a current or former smoker. Osteoporosis. You may be screened starting at age 44 if you are at high risk. Talk with your health care provider about your test results, treatment options, and if necessary, the need for more tests. Vaccines  Your health care provider may recommend certain vaccines, such as: Influenza vaccine.  This is recommended every year. Tetanus, diphtheria, and acellular pertussis (Tdap, Td) vaccine. You may need a Td booster every 10 years. Zoster vaccine. You may need this after age 81. Pneumococcal 13-valent conjugate (PCV13) vaccine. One dose is  recommended after age 58. Pneumococcal polysaccharide (PPSV23) vaccine. One dose is recommended after age 80. Talk to your health care provider about which screenings and vaccines you need and how often you need them. This information is not intended to replace advice given to you by your health care provider. Make sure you discuss any questions you have with your health care provider. Document Released: 12/17/2015 Document Revised: 08/09/2016 Document Reviewed: 09/21/2015 Elsevier Interactive Patient Education  2017 [employer].  Fall Prevention in the Home Falls can cause injuries. They can happen to people of all ages. There are many things you can do to make your home safe and to help prevent falls. What can I do on the outside of my home? Regularly fix the edges of walkways and driveways and fix any cracks. Remove anything that might make you trip as you walk through a door, such as a raised step or threshold. Trim any bushes or trees on the path to your home. Use bright outdoor lighting. Clear any walking paths of anything that might make someone trip, such as rocks or tools. Regularly check to see if handrails are loose or broken. Make sure that both sides of any steps have handrails. Any raised decks and porches should have guardrails on the edges. Have any leaves, snow, or ice cleared regularly. Use sand or salt on walking paths during winter. Clean up any spills in your garage right away. This includes oil or grease spills. What can I do in the bathroom? Use night lights. Install grab bars by the toilet and in the tub and shower. Do not use towel bars as grab bars. Use non-skid mats or decals in the tub or shower. If you need to sit down in the shower, use a plastic, non-slip stool. Keep the floor dry. Clean up any water that spills on the floor as soon as it happens. Remove soap buildup in the tub or shower regularly. Attach bath mats securely with double-sided non-slip rug  tape. Do not have throw rugs and other things on the floor that can make you trip. What can I do in the bedroom? Use night lights. Make sure that you have a light by your bed that is easy to reach. Do not use any sheets or blankets that are too big for your bed. They should not hang down onto the floor. Have a firm chair that has side arms. You can use this for support while you get dressed. Do not have throw rugs and other things on the floor that can make you trip. What can I do in the kitchen? Clean up any spills right away. Avoid walking on wet floors. Keep items that you use a lot in easy-to-reach places. If you need to reach something above you, use a strong step stool that has a grab bar. Keep electrical cords out of the way. Do not use floor polish or wax that makes floors slippery. If you must use wax, use non-skid floor wax. Do not have throw rugs and other things on the floor that can make you trip. What can I do with my stairs? Do not leave any items on the stairs. Make sure that there are handrails on both sides of the stairs and use them. Fix handrails  that are broken or loose. Make sure that handrails are as long as the stairways. Check any carpeting to make sure that it is firmly attached to the stairs. Fix any carpet that is loose or worn. Avoid having throw rugs at the top or bottom of the stairs. If you do have throw rugs, attach them to the floor with carpet tape. Make sure that you have a light switch at the top of the stairs and the bottom of the stairs. If you do not have them, ask someone to add them for you. What else can I do to help prevent falls? Wear shoes that: Do not have high heels. Have rubber bottoms. Are comfortable and fit you well. Are closed at the toe. Do not wear sandals. If you use a stepladder: Make sure that it is fully opened. Do not climb a closed stepladder. Make sure that both sides of the stepladder are locked into place. Ask someone to  hold it for you, if possible. Clearly mark and make sure that you can see: Any grab bars or handrails. First and last steps. Where the edge of each step is. Use tools that help you move around (mobility aids) if they are needed. These include: Canes. Walkers. Scooters. Crutches. Turn on the lights when you go into a dark area. Replace any light bulbs as soon as they burn out. Set up your furniture so you have a clear path. Avoid moving your furniture around. If any of your floors are uneven, fix them. If there are any pets around you, be aware of where they are. Review your medicines with your doctor. Some medicines can make you feel dizzy. This can increase your chance of falling. Ask your doctor what other things that you can do to help prevent falls. This information is not intended to replace advice given to you by your health care provider. Make sure you discuss any questions you have with your health care provider. Document Released: 09/16/2009 Document Revised: 04/27/2016 Document Reviewed: 12/25/2014 Elsevier Interactive Patient Education  2017 [employer].

## 2023-07-24 NOTE — Progress Notes (Cosign Needed)
Subjective:   David Atkins is a 89 y.o. male who presents for Medicare Annual/Subsequent preventive examination.  Visit Complete: Virtual  I connected with  David Atkins on 07/24/23 by a audio enabled telemedicine application and verified that I am speaking with the correct person using two identifiers.  Patient Location: Home  Provider Location: Office/Clinic  I discussed the limitations of evaluation and management by telemedicine. The patient expressed understanding and agreed to proceed.  Vital Signs: Unable to obtain new vitals due to this being a telehealth visit.   Review of Systems     Cardiac Risk Factors include: advanced age (>16men, >59 women);dyslipidemia;male gender;hypertension;Other (see comment), Risk factor comments: CAD, PAD     Objective:    Today's Vitals   07/24/23 1120  Weight: 170 lb (77.1 kg)  Height: 5\' 6"  (1.676 m)   Body mass index is 27.44 kg/m.     07/24/2023   11:43 AM 05/05/2023   12:00 PM 03/13/2023    6:12 AM 03/07/2023   12:28 PM 12/28/2022    6:11 PM 12/27/2022    8:58 AM 01/05/2022    3:54 PM  Advanced Directives  Does Patient Have a Medical Advance Directive? Yes Yes Yes Yes  No Yes  Type of Estate agent of [place];Living will Healthcare Power of [place];Living will;Out of facility DNR (pink MOST or yellow form) Healthcare Power of [place];Living will    Healthcare Power of [place];Living will  Does patient want to make changes to medical advance directive?   No - Patient declined    No - Patient declined  Copy of Healthcare Power of Attorney in Chart? No - copy requested  No - copy requested    Yes - validated most recent copy scanned in chart (See row information)  Would patient like information on creating a medical advance directive?     No - Patient declined      Current Medications (verified) Outpatient Encounter Medications as of 07/24/2023  Medication Sig   amLODipine (NORVASC) 2.5 MG tablet TAKE 1  TABLET BY MOUTH DAILY (Patient taking differently: Take 2.5 mg by mouth at bedtime.)   aspirin 81 MG EC tablet Take 81 mg by mouth daily.    atorvastatin (LIPITOR) 20 MG tablet TAKE 1 TABLET BY MOUTH DAILY   Blood Pressure Monitoring (BLOOD PRESSURE MONITOR AUTOMAT) DEVI Use daily to check blood pressure   cholecalciferol (VITAMIN D) 25 MCG (1000 UNIT) tablet Take 1,000 Units by mouth daily.   finasteride (PROSCAR) 5 MG tablet TAKE 1 TABLET BY MOUTH DAILY   KLOR-CON M20 20 MEQ tablet TAKE 1 TABLET (20 MEQ TOTAL) BY MOUTH 2 (TWO) TIMES DAILY. WITH FOOD   lisinopril (ZESTRIL) 20 MG tablet TAKE 1 TABLET BY MOUTH DAILY (Patient taking differently: Take 20 mg by mouth every morning. TAKE 1 TABLET BY MOUTH  DAILY)   Menthol-Camphor (TIGER BALM ARTHRITIS RUB EX) Apply 1 Application topically as needed.   metoprolol tartrate (LOPRESSOR) 25 MG tablet TAKE ONE-HALF TABLET BY MOUTH  TWICE DAILY   nitroGLYCERIN (NITROSTAT) 0.4 MG SL tablet Place 1 tablet (0.4 mg total) under the tongue every 5 (five) minutes as needed for chest pain. Maximum of 3 doses.   omeprazole (PRILOSEC) 20 MG capsule TAKE 2 CAPSULES BY MOUTH DAILY (Patient taking differently: 20 mg 2 (two) times daily before a meal.)   oxybutynin (DITROPAN) 5 MG tablet TAKE 1 TABLET BY MOUTH TWICE  DAILY (Patient taking differently: Take 5 mg by mouth 2 (two) times  daily.)   polyethylene glycol powder (GLYCOLAX/MIRALAX) powder Take 17 g by mouth once. Dissolve 1 capful of power into any liquid and drink once daily (Patient taking differently: Take 17 g by mouth at bedtime. Dissolve 1 capful of power into any liquid and drink once daily)   rOPINIRole (REQUIP) 0.25 MG tablet TAKE 1 TABLET BY MOUTH AT  BEDTIME (Patient taking differently: Take 0.25 mg by mouth at bedtime.)   Simethicone Extra Strength 125 MG CAPS Take 125 mg by mouth as needed (gas).   Syringe/Needle, Disp, (SYRINGE 3CC/25GX1") 25G X 1" 3 ML MISC Use for b12 injections   tamsulosin (FLOMAX)  0.4 MG CAPS capsule TAKE 1 CAPSULE BY MOUTH DAILY  AFTER BREAKFAST (Patient taking differently: 0.4 mg daily after breakfast. TAKE 1 CAPSULE BY MOUTH  DAILY AFTER BREAKFAST)   lidocaine (LIDODERM) 5 % Place 1 patch onto the skin daily. Remove & Discard patch within 12 hours or as directed by MD (Patient not taking: Reported on 07/24/2023)   No facility-administered encounter medications on file as of 07/24/2023.    Allergies (verified) Sulfa antibiotics   History: Past Medical History:  Diagnosis Date   3-vessel coronary artery disease 2006   a.) s/p 3v revascularization (CABG)   Acute cholecystitis    Anxiety    Basal cell carcinoma 07/09/2012   Bilateral carotid artery stenosis    a.) doppler 07/13/2017L < 39% BICA; b.) doppler 11/05/2018: 40-59% RICA, 60-79% LICA; c.) doppler 11/11/2019, 11/16/2020, 11/15/2021: 1-39% RICA; 40-59% LICA; d.) doppler 11/24/2022: 1-39% BICA   BPH (benign prostatic hyperplasia)    BPPV (benign paroxysmal positional vertigo)    Cerebrovascular small vessel disease 01/02/2018   a.) brain MRI 01/02/2018: chronic small vessel ischemic changes within the cerebral white matter, basal ganglia and pons; b.) brain MRI 11/08/2025: progressive small vessel disease since previous MRI   Cholelithiasis    Chronic diastolic (congestive) heart failure (HCC)    a.) TTE 09/27/2022: EF 60-65%, mild MR, G1DD   Concussion w loss of consciousness of unsp duration, subs 12/29/2018   CVA (cerebral vascular accident) [facility])    a.) brain MRI 11/2022: lacunar infarcts within the bilateral centrum semiovale and left corona radiata   First degree heart block    Full incontinence of feces    GERD (gastroesophageal reflux disease)    Hx of CABG    Hyperlipidemia    Hypertension    Hypokalemia    IBS (irritable bowel syndrome)    Lower leg edema    Lumbar stenosis with neurogenic claudication 12/03/2013   Myocardial infarction [facility]) 2006   a.) Tx'd with 3v revascularization  (CABG)   OAB (overactive bladder)    OSA on CPAP    Osteoporosis 2010   by DEXA at [facility]   PAD (peripheral artery disease) (HCC)    Poor balance    PSVT (paroxysmal supraventricular tachycardia) 11/02/2022   a.) holter 11/02/2022: SVT x 128 episodes --> fastest 4 beats at a max rate of 179 bpm; longested 17 beats at average rate of 98 bpm   RLS (restless legs syndrome)    a.) on ropinirole   S/P CABG x 3 2006   Seizure (HCC)    Sepsis (HCC)    Spondylolisthesis of lumbar region 11/23/2013   MRI Lumbar spine   Umbilical hernia    Past Surgical History:  Procedure Laterality Date   APPENDECTOMY  1951   COLONOSCOPY     COLONOSCOPY WITH PROPOFOL N/A 11/15/2015   Procedure: COLONOSCOPY WITH PROPOFOL;  Surgeon: Wallace Cullens, MD;  Location: [facility] ENDOSCOPY;  Service: Gastroenterology;  Laterality: N/A;   CORONARY ARTERY BYPASS GRAFT  2006   3 vessel, s/p AMI   INTRAOPERATIVE CHOLANGIOGRAM  03/13/2023   Procedure: INTRAOPERATIVE CHOLANGIOGRAM;  Surgeon: Leafy Ro, MD;  Location: [facility] ORS;  Service: General;;   IR EXCHANGE BILIARY DRAIN  02/28/2023   IR PERC CHOLECYSTOSTOMY  12/27/2022   LEFT HEART CATH AND CORONARY ANGIOGRAPHY Left 2006   NASAL SEPTOPLASTY W/ TURBINOPLASTY  1981   TONSILLECTOMY     UMBILICAL HERNIA REPAIR N/A 03/13/2023   Procedure: HERNIA REPAIR UMBILICAL ADULT;  Surgeon: Leafy Ro, MD;  Location: [facility] ORS;  Service: General;  Laterality: N/A;   Family History  Problem Relation Age of Onset   Hyperlipidemia Mother    Hypertension Mother    Hypertension Father    Social History   Socioeconomic History   Marital status: Married    Spouse name: Not on file   Number of children: Not on file   Years of education: Not on file   Highest education level: Not on file  Occupational History   Not on file  Tobacco Use   Smoking status: Former    Current packs/day: 0.00    Types: Cigarettes    Quit date: 07/10/1963    Years since quitting: 60.0   Smokeless tobacco:  Never  Vaping Use   Vaping status: Never Used  Substance and Sexual Activity   Alcohol use: No   Drug use: No   Sexual activity: Not Currently  Other Topics Concern   Not on file  Social History Narrative   Married   Social Determinants of Health   Financial Resource Strain: Low Risk  (07/24/2023)   Overall Financial Resource Strain (CARDIA)    Difficulty of Paying Living Expenses: Not hard at all  Food Insecurity: No Food Insecurity (07/24/2023)   Hunger Vital Sign    Worried About Running Out of Food in the Last Year: Never true    Ran Out of Food in the Last Year: Never true  Transportation Needs: No Transportation Needs (07/24/2023)   PRAPARE - Administrator, Civil Service (Medical): No    Lack of Transportation (Non-Medical): No  Physical Activity: Inactive (07/24/2023)   Exercise Vital Sign    Days of Exercise per Week: 0 days    Minutes of Exercise per Session: 0 min  Stress: No Stress Concern Present (07/24/2023)   [employer] of Occupational Health - Occupational Stress Questionnaire    Feeling of Stress : Not at all  Social Connections: Moderately Isolated (07/24/2023)   Social Connection and Isolation Panel [NHANES]    Frequency of Communication with Friends and Family: More than three times a week    Frequency of Social Gatherings with Friends and Family: More than three times a week    Attends Religious Services: Never    Database administrator or Organizations: No    Attends Engineer, structural: Never    Marital Status: Married    Tobacco Counseling Counseling given: Not Answered   Clinical Intake:  Pre-visit preparation completed: Yes  Pain : No/denies pain     BMI - recorded: 27.44 Nutritional Status: BMI 25 -29 Overweight Nutritional Risks: Nausea/ vomitting/ diarrhea (diarrhea occasionally since gallbladder removed) Diabetes: No  How often do you need to have someone help you when you read instructions, pamphlets, or  other written materials from your doctor or pharmacy?: 1 - Never  Interpreter Needed?: No  Information entered by :: R. Lennix Rotundo LPN   Activities of Daily Living    07/24/2023   11:24 AM 03/13/2023    6:24 AM  In your present state of health, do you have any difficulty performing the following activities:  Hearing? 1 1  Comment wears aids   Vision? 1 0  Comment uses magnifying glass   Difficulty concentrating or making decisions? 1 1  Walking or climbing stairs? 1 1  Comment uses a walker   Dressing or bathing? 0 1  Doing errands, shopping? 1   Comment children help   Preparing Food and eating ? N   Using the Toilet? N   In the past six months, have you accidently leaked urine? Y   Comment uses depends   Do you have problems with loss of bowel control? Y   Comment pads   Managing your Medications? N   Managing your Finances? Y   Comment some help from children   Housekeeping or managing your Housekeeping? Y   Comment children and has a care giver     Patient Care Team: Sherlene Shams, MD as PCP - General (Internal Medicine) Otho Ket, RN as [facility] Care Management  Indicate any recent Medical Services you may have received from other than [facility] providers in the past year (date may be approximate).     Assessment:   This is a routine wellness examination for David Atkins.  Hearing/Vision screen Hearing Screening - Comments:: Wears aids Vision Screening - Comments:: Uses magnifying glass  Dietary issues and exercise activities discussed:     Goals Addressed             This Visit's Progress    Patient Stated       Continue to eat well       Depression Screen    07/24/2023   11:35 AM 06/04/2023   12:19 PM 01/02/2023    1:26 PM 12/07/2022   12:19 PM 10/24/2022   10:33 AM 01/05/2022    3:53 PM 08/31/2021    1:35 PM  PHQ 2/9 Scores  PHQ - 2 Score 0 4 2 0 1 0 0  PHQ- 9 Score 7 16 9         Fall Risk    07/24/2023   11:28 AM 06/04/2023   12:19 PM  03/05/2023    9:54 AM 01/02/2023    1:26 PM 12/07/2022   12:18 PM  Fall Risk   Falls in the past year? 1 0 0 1 1  Number falls in past yr: 1 0  0 0  Injury with Fall? 1 0  0 0  Comment fractured ribs      Risk for fall due to : History of fall(s);Impaired balance/gait No Fall Risks  History of fall(s) History of fall(s);Impaired balance/gait  Risk for fall due to: Comment fainting spells      Follow up Falls evaluation completed;Falls prevention discussed Falls evaluation completed  Falls evaluation completed Falls evaluation completed    MEDICARE RISK AT HOME: Medicare Risk at Home Any stairs in or around the home?: Yes If so, are there any without handrails?: No Home free of loose throw rugs in walkways, pet beds, electrical cords, etc?: Yes Adequate lighting in your home to reduce risk of falls?: Yes Life alert?: Yes (has Alexis) Use of a cane, walker or w/c?: Yes (walker) Grab bars in the bathroom?: Yes Shower chair or bench in shower?: Yes Elevated toilet  seat or a handicapped toilet?: Yes   Cognitive Function:        07/24/2023   11:41 AM 11/17/2020    1:15 PM 11/14/2019   12:23 PM 11/11/2018   12:20 PM 11/06/2017    9:33 AM  6CIT Screen  What Year? 0 points 0 points 0 points 0 points 0 points  What month? 0 points 0 points 0 points 0 points 0 points  What time? 0 points 0 points 0 points 0 points 0 points  Count back from 20 0 points 0 points 0 points 0 points 0 points  Months in reverse 2 points 0 points 0 points 0 points 0 points  Repeat phrase 0 points  2 points 0 points 0 points  Total Score 2 points  2 points 0 points 0 points    Immunizations Immunization History  Administered Date(s) Administered   COVID-19, mRNA, vaccine(Comirnaty)12 years and older 09/07/2022   Fluad Quad(high Dose 65+) 08/04/2019, 08/31/2021   Influenza Split 11/11/2012, 09/25/2014   Influenza, High Dose Seasonal PF 10/15/2013, 10/01/2015, 08/30/2016, 08/13/2018, 08/31/2022, 08/31/2022    Influenza-Unspecified 09/03/2000, 10/04/2001, 10/04/2002, 12/07/2003, 10/04/2004, 09/03/2005, 09/03/2006, 09/04/2007, 08/04/2008, 11/03/2008, 11/09/2010, 12/05/2011, 11/18/2012, 10/04/2013, 11/03/2014, 10/01/2016, 08/04/2017, 08/31/2017, 10/04/2018, 10/04/2020   PFIZER Comirnaty(Gray Top)Covid-19 Tri-Sucrose Vaccine 04/14/2021   PFIZER(Purple Top)SARS-COV-2 Vaccination 12/15/2019, 01/05/2020, 07/18/2020   Pneumococcal Conjugate-13 11/25/2014, 04/03/2015   Pneumococcal Polysaccharide-23 11/04/1999, 12/02/1999, 08/04/2005, 11/25/2012   Td 12/02/1999   Tdap 05/23/2010, 08/15/2017   Zoster Recombinant(Shingrix) 09/11/2019   Zoster, Live 04/10/2011    TDAP status: Up to date  Flu Vaccine status: Up to date  Pneumococcal vaccine status: Up to date  Covid-19 vaccine status: Completed vaccines  Qualifies for Shingles Vaccine? Yes   Zostavax completed Yes   Shingrix Completed?: No.    Education has been provided regarding the importance of this vaccine. Patient has been advised to call insurance company to determine out of pocket expense if they have not yet received this vaccine. Advised may also receive vaccine at local pharmacy or Health Dept. Verbalized acceptance and understanding.  Screening Tests Health Maintenance  Topic Date Due   Zoster Vaccines- Shingrix (2 of 2) 11/06/2019   COVID-19 Vaccine (6 - 2023-24 season) 11/02/2022   Medicare Annual Wellness (AWV)  01/05/2023   INFLUENZA VACCINE  07/05/2023   DTaP/Tdap/Td (4 - Td or Tdap) 08/16/2027   Pneumonia Vaccine 35+ Years old  Completed   HPV VACCINES  Aged Out    Health Maintenance  Health Maintenance Due  Topic Date Due   Zoster Vaccines- Shingrix (2 of 2) 11/06/2019   COVID-19 Vaccine (6 - 2023-24 season) 11/02/2022   Medicare Annual Wellness (AWV)  01/05/2023   INFLUENZA VACCINE  07/05/2023    Colorectal cancer screening: No longer required.   Lung Cancer Screening: (Low Dose CT Chest recommended if Age 72-80  years, 20 pack-year currently smoking OR have quit w/in 15years.) does not qualify.    Additional Screening:  Hepatitis C Screening: does not qualify; Completed NA age  Vision Screening: Recommended annual ophthalmology exams for early detection of glaucoma and other disorders of the eye. Is the patient up to date with their annual eye exam?  No  Who is the provider or what is the name of the office in which the patient attends annual eye exams? [facility] If pt is not established with a provider, would they like to be referred to a provider to establish care? No .   Dental Screening: Recommended annual dental exams for proper  oral hygiene   Community Resource Referral / Chronic Care Management: CRR required this visit?  No   CCM required this visit?  No     Plan:     I have personally reviewed and noted the following in the patient's chart:   Medical and social history Use of alcohol, tobacco or illicit drugs  Current medications and supplements including opioid prescriptions. Patient is not currently taking opioid prescriptions. Functional ability and status Nutritional status Physical activity Advanced directives List of other physicians Hospitalizations, surgeries, and ER visits in previous 12 months Vitals Screenings to include cognitive, depression, and falls Referrals and appointments  In addition, I have reviewed and discussed with patient certain preventive protocols, quality metrics, and best practice recommendations. A written personalized care plan for preventive services as well as general preventive health recommendations were provided to patient.     Sydell Axon, LPN   01/03/8656   After Visit Summary: (MyChart) Due to this being a telephonic visit, the after visit summary with patients personalized plan was offered to patient via MyChart   Nurse Notes: None   I have reviewed the above information and agree with above.   Duncan Dull, MD

## 2023-07-30 DIAGNOSIS — M6281 Muscle weakness (generalized): Secondary | ICD-10-CM | POA: Diagnosis not present

## 2023-07-30 DIAGNOSIS — I10 Essential (primary) hypertension: Secondary | ICD-10-CM | POA: Diagnosis not present

## 2023-08-16 ENCOUNTER — Encounter: Payer: Self-pay | Admitting: Internal Medicine

## 2023-08-16 DIAGNOSIS — R569 Unspecified convulsions: Secondary | ICD-10-CM | POA: Diagnosis not present

## 2023-08-16 MED ORDER — TRIAMCINOLONE ACETONIDE 0.1 % EX CREA: 1.0000 | TOPICAL_CREAM | Freq: Two times a day (BID) | CUTANEOUS | 0 refills | Status: DC

## 2023-08-20 ENCOUNTER — Ambulatory Visit: Payer: Medicare Other | Admitting: Podiatry

## 2023-08-20 ENCOUNTER — Encounter: Payer: Self-pay | Admitting: Podiatry

## 2023-08-20 DIAGNOSIS — M79675 Pain in left toe(s): Secondary | ICD-10-CM

## 2023-08-20 DIAGNOSIS — M79674 Pain in right toe(s): Secondary | ICD-10-CM | POA: Diagnosis not present

## 2023-08-20 DIAGNOSIS — B351 Tinea unguium: Secondary | ICD-10-CM

## 2023-08-20 DIAGNOSIS — I739 Peripheral vascular disease, unspecified: Secondary | ICD-10-CM

## 2023-08-23 ENCOUNTER — Ambulatory Visit: Payer: Self-pay

## 2023-08-23 NOTE — Patient Instructions (Signed)
Visit Information  Thank you for taking time to visit with me today. Please don't hesitate to contact me if I can be of assistance to you.   Following are the goals we discussed today:   Continue to use ambulatory device as recommended Take medications as prescribe Keep follow up appointments with providers Notify provider of ongoing gastrointestinal symptoms Notify provider for increase in COPD symptoms.  Call 911 for severe symptoms.  Make sure home is free of clutter/ debris to ensure safe ambulation    Our next appointment is by telephone on 10/12/23 at 2 pm  Please call the care guide team at [phone] if you need to cancel or reschedule your appointment.   If you are experiencing a Mental Health or Behavioral Health Crisis or need someone to talk to, please call the Suicide and Crisis Lifeline: 988 call 1-800-273-TALK (toll free, 24 hour hotline)  Patient verbalizes understanding of instructions and care plan provided today and agrees to view in MyChart. Active MyChart status and patient understanding of how to access instructions and care plan via MyChart confirmed with patient.     George Ina RN,BSN,CCM [facility] Care Coordination [phone] direct line

## 2023-08-23 NOTE — Patient Outreach (Signed)
Care Coordination   Follow Up Visit Note   08/23/2023 Name: David Atkins MRN: [id number] DOB: 07/13/1929  Kelvis Guild is a 89 y.o. year old male who sees Darrick Huntsman, Mar Daring, MD for primary care. I spoke with  Billie Lade  and daughter Ramie Abdalla by phone today.  What matters to the patients health and wellness today?  Daughter reports patient sustaining fall 2 weeks ago. She reports patient tripped over an office chair mat hitting his left shoulder.  Patient / daughter states other than patient's shoulder being sore he did not sustain serious injury.   Patient denies any increase in COPD symptoms.  Daughter and patient discuss patients concerns regarding gastrointestinal cramping gas post gallbladder surgery.  Daughter inquired if there was any treatment for patient that would help with these symptoms.    Goals Addressed             This Visit's Progress    Management of chronic health conditions and falls       Interventions Today    Flowsheet Row Most Recent Value  Chronic Disease   Chronic disease during today's visit Chronic Obstructive Pulmonary Disease (COPD), Other  [falls, diarrhea]  General Interventions   General Interventions Discussed/Reviewed General Interventions Reviewed, Doctor Visits  [evaluation of current treatment plan for mentioned health conditions and patients adherence to plan as established by provider. Assessed for COPD symptoms]  Doctor Visits Discussed/Reviewed Doctor Visits Reviewed  Annabell Sabal upcoming provider visits.]  Education Interventions   Education Provided Provided Education  [Advised to report gastrointestinal symptoms to primary care provider. Reviewed COPD signs/ symptoms and action plan]  Pharmacy Interventions   Pharmacy Dicussed/Reviewed Pharmacy Topics Reviewed  [medications reviewed and compliance with medications encouraged. Discussed new medication addition to treatment plan]  Safety Interventions   Safety Discussed/Reviewed  Safety Reviewed, Fall Risk  [Discussed safety/ fall prevention.  Advised to assess areas of home for possible trip hazards and remove. Encouraged ongoing use of ambulatory device for safety and stability]              SDOH assessments and interventions completed:  No     Care Coordination Interventions:  Yes, provided   Follow up plan: Follow up call scheduled for 10/12/23    Encounter Outcome:  Patient Visit Completed   George Ina RN,BSN,CCM [facility] Care Coordination [phone] direct line

## 2023-08-25 NOTE — Progress Notes (Signed)
Subjective:  Patient ID: David Atkins, male    DOB: [birth year],  MRN: [id number]  David Atkins presents to clinic today for: at risk foot care. Patient has h/o PAD and painful thick toenails that are difficult to trim. Pain interferes with ambulation. Aggravating factors include wearing enclosed shoe gear. Pain is relieved with periodic professional debridement.   PCP is Sherlene Shams, MD.  Allergies  Allergen Reactions   Sulfa Antibiotics Rash    Review of Systems: Negative except as noted in the HPI.  Objective: No changes noted in today's physical examination. There were no vitals filed for this visit.  David Atkins is a pleasant 89 y.o. male in NAD. AAO x 3.  Vascular Examination: Capillary refill time <3 seconds b/l LE. Diminished pedal pulses b/l LE. Digital hair absent b/l. Skin temperature gradient WNL b/l. No varicosities b/l. Lower extremity skin temperature gradient warm to cool. Patient wearing compression hose on today's visit. Dependent edema noted b/l LE.Marland Kitchen  Dermatological Examination: Pedal skin with normal turgor, texture and tone b/l. No open wounds. No interdigital macerations b/l. Toenails 1-5 b/l thickened, discolored, dystrophic with subungual debris. There is pain on palpation to dorsal aspect of nailplates. No corns, calluses nor porokeratotic lesions noted..  Neurological Examination: Protective sensation intact with 10 gram monofilament b/l LE. Vibratory sensation intact b/l LE.   Musculoskeletal Examination: Muscle strength 5/5 to all lower extremity muscle groups bilaterally. HAV with bunion deformity noted b/l LE. Utilizes rollator for ambulation assistance.  Assessment/Plan: 1. Pain due to onychomycosis of toenails of both feet   2. PAD (peripheral artery disease) (HCC)    -Consent given for treatment as described below: -Examined patient. -Continue supportive shoe gear daily. -Mycotic toenails 1-5 bilaterally were debrided in length and  girth with sterile nail nippers and dremel without incident. -Patient/POA to call should there be question/concern in the interim.   Return in about 3 months (around 11/19/2023).  Freddie Breech, DPM

## 2023-08-26 ENCOUNTER — Encounter: Payer: Self-pay | Admitting: Internal Medicine

## 2023-09-13 ENCOUNTER — Other Ambulatory Visit: Payer: Self-pay

## 2023-09-13 ENCOUNTER — Telehealth: Payer: Self-pay | Admitting: Internal Medicine

## 2023-09-13 ENCOUNTER — Emergency Department
Admission: EM | Admit: 2023-09-13 | Discharge: 2023-09-14 | Disposition: A | Discharge status: Home / Self Care | Payer: Medicare Other | Attending: Emergency Medicine | Admitting: Emergency Medicine

## 2023-09-13 DIAGNOSIS — R569 Unspecified convulsions: Secondary | ICD-10-CM | POA: Diagnosis not present

## 2023-09-13 DIAGNOSIS — N3090 Cystitis, unspecified without hematuria: Secondary | ICD-10-CM | POA: Insufficient documentation

## 2023-09-13 DIAGNOSIS — R350 Frequency of micturition: Secondary | ICD-10-CM | POA: Diagnosis present

## 2023-09-13 LAB — URINALYSIS, ROUTINE W REFLEX MICROSCOPIC
Bilirubin Urine: NEGATIVE
Glucose, UA: NEGATIVE mg/dL
Hgb urine dipstick: NEGATIVE
Ketones, ur: NEGATIVE mg/dL
Nitrite: POSITIVE — AB
Protein, ur: NEGATIVE mg/dL
Specific Gravity, Urine: 1.009 (ref 1.005–1.030)
pH: 5 (ref 5.0–8.0)

## 2023-09-13 LAB — CBC
HCT: 45.4 % (ref 39.0–52.0)
Hemoglobin: 14.6 g/dL (ref 13.0–17.0)
MCH: 32.4 pg (ref 26.0–34.0)
MCHC: 32.2 g/dL (ref 30.0–36.0)
MCV: 100.9 fL — ABNORMAL HIGH (ref 80.0–100.0)
Platelets: 218 10*3/uL (ref 150–400)
RBC: 4.5 MIL/uL (ref 4.22–5.81)
RDW: 12.8 % (ref 11.5–15.5)
WBC: 8.2 10*3/uL (ref 4.0–10.5)
nRBC: 0 % (ref 0.0–0.2)

## 2023-09-13 LAB — BASIC METABOLIC PANEL
Anion gap: 15 (ref 5–15)
BUN: 15 mg/dL (ref 8–23)
CO2: 25 mmol/L (ref 22–32)
Calcium: 9.2 mg/dL (ref 8.9–10.3)
Chloride: 104 mmol/L (ref 98–111)
Creatinine, Ser: 0.78 mg/dL (ref 0.61–1.24)
GFR, Estimated: >60 mL/min (ref >60)
Glucose, Bld: 118 mg/dL — ABNORMAL HIGH (ref 70–99)
Potassium: 4.4 mmol/L (ref 3.5–5.1)
Sodium: 144 mmol/L (ref 135–145)

## 2023-09-13 NOTE — Telephone Encounter (Signed)
Pt daughter called stating the pt is urinating uncontrollably and she want to see if she can pick up a urine cup tomorrow because she may think he may have a UTI

## 2023-09-13 NOTE — ED Provider Notes (Signed)
[facility] Provider Note    Event Date/Time   First MD Initiated Contact with Patient 09/13/23 2343     (approximate)   History   Urinary Frequency   HPI  Asier Desroches is a 89 y.o. male who presents to the ED for evaluation of Urinary Frequency   Review an outpatient neurology consultation from August.  Seen for syncope, incontinence and possibility of new onset seizure and scheduled for an EEG that occurred earlier today.  Patient presents to the ED alongside his daughter for evaluation of urinary incontinence and frequency.  He had a spell of incontinence during his EEG and reports significant embarrassment.  He has been struggling recently with both stool and urinary incontinence and using depends.  Daughter, at the bedside, reports that he has been perhaps mildly more confused in the past week or so but nothing serious.  They are happy to take him home, daughter stays with patient and his wife.    Physical Exam   Triage Vital Signs: ED Triage Vitals  Encounter Vitals Group     BP 09/13/23 1922 (!) 181/77     Systolic BP Percentile --      Diastolic BP Percentile --      Pulse Rate 09/13/23 1922 72     Resp 09/13/23 1922 18     Temp 09/13/23 1922 98.2 F (36.8 C)     Temp Source 09/13/23 1922 Oral     SpO2 09/13/23 1922 100 %     Weight 09/13/23 1923 185 lb (83.9 kg)     Height 09/13/23 1923 5\' 10"  (1.778 m)     Head Circumference --      Peak Flow --      Pain Score 09/13/23 1923 0     Pain Loc --      Pain Education --      Exclude from Growth Chart --     Most recent vital signs: Vitals:   09/13/23 1922  BP: (!) 181/77  Pulse: 72  Resp: 18  Temp: 98.2 F (36.8 C)  SpO2: 100%    General: Awake, no distress.  Pleasant and conversational, sharp CV:  Good peripheral perfusion.  Resp:  Normal effort.  Abd:  No distention.  Soft and benign MSK:  No deformity noted.  Neuro:  No focal deficits appreciated. Other:     ED  Results / Procedures / Treatments   Labs (all labs ordered are listed, but only abnormal results are displayed) Labs Reviewed  URINALYSIS, ROUTINE W REFLEX MICROSCOPIC - Abnormal; Notable for the following components:      Result Value   Color, Urine YELLOW (*)    APPearance HAZY (*)    Nitrite POSITIVE (*)    Leukocytes,Ua MODERATE (*)    Bacteria, UA RARE (*)    All other components within normal limits  CBC - Abnormal; Notable for the following components:   MCV 100.9 (*)    All other components within normal limits  BASIC METABOLIC PANEL - Abnormal; Notable for the following components:   Glucose, Bld 118 (*)    All other components within normal limits  URINE CULTURE    EKG   RADIOLOGY   Official radiology report(s): No results found.  PROCEDURES and INTERVENTIONS:  Procedures  Medications  cephALEXin (KEFLEX) capsule 500 mg (has no administration in time range)     IMPRESSION / MDM / ASSESSMENT AND PLAN / ED COURSE  I reviewed the triage vital signs  and the nursing notes.  Differential diagnosis includes, but is not limited to, sepsis, stroke, seizure, UTI, AKI,  {Patient presents with symptoms of an acute illness or injury that is potentially life-threatening.  Pleasant patient presents after a spell of urinary incontinence while getting an outpatient EEG, with findings consistent with acute cystitis and suitable for outpatient management.  He looks well without stigmata of sepsis.  While the acknowledge some mild confusion at home that is barely noticeable to them, no significant encephalopathy to preclude outpatient management.  Normal CBC and metabolic panel.  Urine with infectious features and we will send for culture as we start the patient on Keflex.  Suitable for outpatient management.      FINAL CLINICAL IMPRESSION(S) / ED DIAGNOSES   Final diagnoses:  Cystitis     Rx / DC Orders   ED Discharge Orders          Ordered    cephALEXin  (KEFLEX) 500 MG capsule  3 times daily        09/14/23 0021             Note:  This document was prepared using Dragon voice recognition software and may include unintentional dictation errors.   Delton Prairie, MD 09/14/23 Moses Manners

## 2023-09-13 NOTE — ED Triage Notes (Signed)
Pt to ED via POV c/o frequent urination. Pt was in neuro appt getting EEG done to test for non epileptic seizures. Pts daughter says that he is getting these tests done because he has episodes of "involuntary urination".  Pt urinated a lot during test today and was told that it might be a UTI. Pts daughter also reports he "doesn't act like himself", pt a&ox4 answers questions appropriately. Pt denies any pain.Marland Kitchen no fevers at home

## 2023-09-14 ENCOUNTER — Encounter: Payer: Self-pay | Admitting: Internal Medicine

## 2023-09-14 MED ORDER — CEPHALEXIN 500 MG PO CAPS: 500.0000 mg | ORAL_CAPSULE | Freq: Three times a day (TID) | ORAL | 0 refills | Status: AC

## 2023-09-14 MED ORDER — CEPHALEXIN 500 MG PO CAPS
500.0000 mg | ORAL_CAPSULE | Freq: Once | ORAL | Status: AC
  Administered 2023-09-14: 500 mg via ORAL
  Filled 2023-09-14: qty 1

## 2023-09-14 NOTE — Discharge Instructions (Signed)
Keflex antibiotic 3 times daily for 5 days to treat UTI  Use Tylenol for pain and fevers.  Up to 1000 mg per dose, up to 4 times per day.  Do not take more than 4000 mg of Tylenol/acetaminophen within 24 hours.Marland Kitchen

## 2023-09-14 NOTE — Telephone Encounter (Signed)
Pt seen in ED.  Dx with UTI.  Has appointment with Dr. Darrick Huntsman 09/24/23.

## 2023-09-14 NOTE — Telephone Encounter (Signed)
noted 

## 2023-09-14 NOTE — Telephone Encounter (Signed)
Pt seen in ED 09/13/23 and dx with UTI.

## 2023-09-14 NOTE — ED Notes (Signed)
ED Provider at bedside. 

## 2023-09-14 NOTE — Telephone Encounter (Signed)
Spoke with pt to let him know and he stated that you could let him know through mychart over the weekend if anything needs to be changed. He stated that his daughter has left for [place] today.

## 2023-09-15 ENCOUNTER — Other Ambulatory Visit: Payer: Self-pay | Admitting: Internal Medicine

## 2023-09-15 LAB — URINE CULTURE

## 2023-09-17 NOTE — Telephone Encounter (Signed)
Spoke with pt and informed him of the message below. Pt gave a verbal understanding and stated that he will finish the antibiotic tomorrow.

## 2023-09-17 NOTE — Telephone Encounter (Signed)
Fyi, pt is doing better

## 2023-09-24 ENCOUNTER — Encounter: Payer: Self-pay | Admitting: Internal Medicine

## 2023-09-24 ENCOUNTER — Ambulatory Visit (INDEPENDENT_AMBULATORY_CARE_PROVIDER_SITE_OTHER): Payer: Medicare Other | Admitting: Internal Medicine

## 2023-09-24 VITALS — BP 150/70 | HR 76 | Ht 70.0 in | Wt 188.0 lb

## 2023-09-24 DIAGNOSIS — K915 Postcholecystectomy syndrome: Secondary | ICD-10-CM

## 2023-09-24 DIAGNOSIS — R143 Flatulence: Secondary | ICD-10-CM | POA: Diagnosis not present

## 2023-09-24 DIAGNOSIS — N3281 Overactive bladder: Secondary | ICD-10-CM

## 2023-09-24 DIAGNOSIS — R142 Eructation: Secondary | ICD-10-CM | POA: Diagnosis not present

## 2023-09-24 DIAGNOSIS — Z7189 Other specified counseling: Secondary | ICD-10-CM

## 2023-09-24 DIAGNOSIS — R141 Gas pain: Secondary | ICD-10-CM | POA: Diagnosis not present

## 2023-09-24 DIAGNOSIS — Z66 Do not resuscitate: Secondary | ICD-10-CM

## 2023-09-24 MED ORDER — CHOLESTYRAMINE 4 GM/DOSE PO POWD: 2.0000 g | Freq: Three times a day (TID) | ORAL | 12 refills | Status: DC

## 2023-09-24 NOTE — Patient Instructions (Addendum)
1)  For the gas:  try to prevent it by using beano with lunch and dinner ( WITH any meal containing  vegetables)  You can still use simethicone afterward if the beano doesn't work.  Maximum dose is 2000 mg daily   You can also try treating lactose intolerance .use lactase with your ice cream and cheese . Use lactaid for your milK products   2)  For the loose stools,  I am recommending a Cholestyramine trial :  1./2   packet with every meal    3) *You can switch from Gemtesa to Oxybutynin FOR YOUR BLADDER ISSUES

## 2023-09-24 NOTE — Progress Notes (Unsigned)
Subjective:  Patient ID: David Atkins, male    DOB: 25-Aug-1929  Age: 89 y.o. MRN: [id number]  CC: {There were no encounter diagnoses. (Refresh or delete this SmartLink)}   HPI David Atkins presents for  Chief Complaint  Patient presents with   Medical Management of Chronic Issues   David Atkins is accompanied  by his neighbor David Atkins.  His daughter David Atkins is out of town.   1) daily explosive BM's  , loose c hronically but worse since April 2024 GB surgery and now complicated by severe gas pains despite iusing Gas x    2) urinary  incontinence : nighttime an issue \ using a different urinal which has helped  3) OAB:  wants to resume oxybutynin due to Thayer not working as well  4) EOL discussion :  DNR orderes disussed "I would like that'     Outpatient Medications Prior to Visit  Medication Sig Dispense Refill   amLODipine (NORVASC) 2.5 MG tablet TAKE 1 TABLET BY MOUTH DAILY 100 tablet 2   aspirin 81 MG EC tablet Take 81 mg by mouth daily.      atorvastatin (LIPITOR) 20 MG tablet TAKE 1 TABLET BY MOUTH DAILY 100 tablet 2   Blood Pressure Monitoring (BLOOD PRESSURE MONITOR AUTOMAT) DEVI Use daily to check blood pressure 1 Device 0   cholecalciferol (VITAMIN D) 25 MCG (1000 UNIT) tablet Take 1,000 Units by mouth daily.     finasteride (PROSCAR) 5 MG tablet TAKE 1 TABLET BY MOUTH DAILY 100 tablet 2   GEMTESA 75 MG TABS Take 1 tablet by mouth daily.     KLOR-CON M20 20 MEQ tablet TAKE 1 TABLET (20 MEQ TOTAL) BY MOUTH 2 (TWO) TIMES DAILY. WITH FOOD 4 tablet 0   lisinopril (ZESTRIL) 20 MG tablet TAKE 1 TABLET BY MOUTH DAILY 100 tablet 2   Menthol-Camphor (TIGER BALM ARTHRITIS RUB EX) Apply 1 Application topically as needed.     metoprolol tartrate (LOPRESSOR) 25 MG tablet TAKE ONE-HALF TABLET BY MOUTH  TWICE DAILY 100 tablet 1   nitroGLYCERIN (NITROSTAT) 0.4 MG SL tablet Place 1 tablet (0.4 mg total) under the tongue every 5 (five) minutes as needed for chest pain. Maximum of 3 doses.  25 tablet 3   omeprazole (PRILOSEC) 20 MG capsule TAKE 2 CAPSULES BY MOUTH DAILY (Patient taking differently: 20 mg 2 (two) times daily before a meal.) 200 capsule 2   oxybutynin (DITROPAN) 5 MG tablet TAKE 1 TABLET BY MOUTH TWICE  DAILY (Patient taking differently: Take 5 mg by mouth 2 (two) times daily.) 200 tablet 2   polyethylene glycol powder (GLYCOLAX/MIRALAX) powder Take 17 g by mouth once. Dissolve 1 capful of power into any liquid and drink once daily (Patient taking differently: Take 17 g by mouth at bedtime. Dissolve 1 capful of power into any liquid and drink once daily) 500 g 0   rOPINIRole (REQUIP) 0.25 MG tablet TAKE 1 TABLET BY MOUTH AT  BEDTIME (Patient taking differently: Take 0.25 mg by mouth at bedtime.) 100 tablet 2   Simethicone Extra Strength 125 MG CAPS Take 125 mg by mouth as needed (gas).     Syringe/Needle, Disp, (SYRINGE 3CC/25GX1") 25G X 1" 3 ML MISC Use for b12 injections 50 each 0   tamsulosin (FLOMAX) 0.4 MG CAPS capsule TAKE 1 CAPSULE BY MOUTH DAILY  AFTER BREAKFAST (Patient taking differently: 0.4 mg daily after breakfast. TAKE 1 CAPSULE BY MOUTH  DAILY AFTER BREAKFAST) 100 capsule 2   triamcinolone cream (KENALOG) 0.1 %  Apply 1 Application topically 2 (two) times daily. Until itching and rash resolve 80 g 0   lidocaine (LIDODERM) 5 % Place 1 patch onto the skin daily. Remove & Discard patch within 12 hours or as directed by MD (Patient not taking: Reported on 07/24/2023) 30 patch 1   No facility-administered medications prior to visit.    Review of Systems;  Patient denies headache, fevers, malaise, unintentional weight loss, skin rash, eye pain, sinus congestion and sinus pain, sore throat, dysphagia,  hemoptysis , cough, dyspnea, wheezing, chest pain, palpitations, orthopnea, edema, abdominal pain, nausea, melena, diarrhea, constipation, flank pain, dysuria, hematuria, urinary  Frequency, nocturia, numbness, tingling, seizures,  Focal weakness, Loss of  consciousness,  Tremor, insomnia, depression, anxiety, and suicidal ideation.      Objective:  BP (!) 150/70   Pulse 76   Ht 5\' 10"  (1.778 m)   Wt 188 lb (85.3 kg)   SpO2 96%   BMI 26.98 kg/m   BP Readings from Last 3 Encounters:  09/24/23 (!) 150/70  09/14/23 (!) 168/79  06/04/23 128/84    Wt Readings from Last 3 Encounters:  09/24/23 188 lb (85.3 kg)  09/13/23 185 lb (83.9 kg)  07/24/23 170 lb (77.1 kg)    Physical Exam  Lab Results  Component Value Date   HGBA1C 5.7 11/17/2019   HGBA1C 5.7 02/08/2018   HGBA1C 5.6 11/02/2017    Lab Results  Component Value Date   CREATININE 0.78 09/13/2023   CREATININE 0.70 06/04/2023   CREATININE 0.73 03/08/2023    Lab Results  Component Value Date   WBC 8.2 09/13/2023   HGB 14.6 09/13/2023   HCT 45.4 09/13/2023   PLT 218 09/13/2023   GLUCOSE 118 (H) 09/13/2023   CHOL 98 12/07/2022   TRIG 92.0 12/07/2022   HDL 39.50 12/07/2022   LDLDIRECT CANCELED 11/02/2017   LDLCALC 40 12/07/2022   ALT 24 06/04/2023   AST 26 06/04/2023   NA 144 09/13/2023   K 4.4 09/13/2023   CL 104 09/13/2023   CREATININE 0.78 09/13/2023   BUN 15 09/13/2023   CO2 25 09/13/2023   TSH 1.70 06/04/2023   INR 1.2 12/27/2022   HGBA1C 5.7 11/17/2019   MICROALBUR <0.7 02/08/2018    No results found.  Assessment & Plan:  .There are no diagnoses linked to this encounter.   I provided 30 minutes of face-to-face time during this encounter reviewing patient's last visit with me, patient's  most recent visit with cardiology,  nephrology,  and neurology,  recent surgical and non surgical procedures, previous  labs and imaging studies, counseling on currently addressed issues,  and post visit ordering to diagnostics and therapeutics .   Follow-up: No follow-ups on file.   Sherlene Shams, MD

## 2023-09-25 ENCOUNTER — Encounter: Payer: Self-pay | Admitting: Internal Medicine

## 2023-09-25 DIAGNOSIS — Z7189 Other specified counseling: Secondary | ICD-10-CM | POA: Insufficient documentation

## 2023-09-25 DIAGNOSIS — Z66 Do not resuscitate: Secondary | ICD-10-CM | POA: Insufficient documentation

## 2023-09-25 DIAGNOSIS — K915 Postcholecystectomy syndrome: Secondary | ICD-10-CM | POA: Insufficient documentation

## 2023-09-25 NOTE — Assessment & Plan Note (Signed)
DNR status has been requested by patient after risks and benefits of the order were discussed today .  Order s (2) given to patient and placed in chart.

## 2023-09-25 NOTE — Assessment & Plan Note (Signed)
Continue simethicone.  Add Beano and Lactase qac prn.

## 2023-09-25 NOTE — Assessment & Plan Note (Signed)
He changed medicatio nfro moxybutynin to less effective Gemtesa due to fear of long term complications, which I advised are unlikely to be relevant given his age.  He prefers to resume oxybutynin .  He is using a bedside urinal with a hose and stationery base. that is much more convenient than the handheld urinal

## 2023-09-25 NOTE — Assessment & Plan Note (Signed)
His BMs have been uncontrolled since his surgery.  Trial of Qyestran starting with 1/2 packet tid qac

## 2023-09-26 DIAGNOSIS — R251 Tremor, unspecified: Secondary | ICD-10-CM | POA: Diagnosis not present

## 2023-09-26 DIAGNOSIS — R55 Syncope and collapse: Secondary | ICD-10-CM | POA: Diagnosis not present

## 2023-09-26 DIAGNOSIS — R531 Weakness: Secondary | ICD-10-CM | POA: Diagnosis not present

## 2023-09-26 DIAGNOSIS — R200 Anesthesia of skin: Secondary | ICD-10-CM | POA: Diagnosis not present

## 2023-10-12 ENCOUNTER — Ambulatory Visit: Payer: Self-pay

## 2023-10-12 NOTE — Patient Outreach (Signed)
  Care Coordination   Follow Up Visit Note   10/12/2023 Name: David Atkins MRN: [id number] DOB: 18-Jul-1929  David Atkins is a 89 y.o. year old male who sees David Atkins, David Daring, MD for primary care. I spoke with  David Atkins by phone today.  What matters to the patients health and wellness today?  Patient reports having follow up visit with neurologist and primary care provider.  Denies having any treatment change regarding syncope episodes.  Per chart review neurology to monitor syncope episodes. Patient states he feels a little weaker. He states he continues to use walker for ambulation. Denies any new falls since last outreach with RNCM.  Patient denies any further needs or concerns and is agreeable that care coordination goals have been met.   Goals Addressed             This Visit's Progress    COMPLETED: Management of chronic health conditions and falls       Interventions Today    Flowsheet Row Most Recent Value  Chronic Disease   Chronic disease during today's visit Chronic Obstructive Pulmonary Disease (COPD), Other  [syncope/ falls]  General Interventions   General Interventions Discussed/Reviewed General Interventions Reviewed, Doctor Visits  [evaluation of current treatment plan for COPD/ syncope/ falls and patients adherence to plan as established by provider.  Assessed for COPD symptoms and syncope/fall episodes]  Doctor Visits Discussed/Reviewed Doctor Visits Reviewed  David Atkins to keep follow up with providers.]  Education Interventions   Education Provided Provided Education  [Reinforced need to drink fluids/ remain hydrated as recommended by provider.  Advised to notify neurologist for ongoing syncope episodes.]  Pharmacy Interventions   Pharmacy Dicussed/Reviewed Pharmacy Topics Reviewed  David Atkins to take medications as prescribed.]  Safety Interventions   Safety Discussed/Reviewed Fall Risk              SDOH assessments and interventions completed:  No      Care Coordination Interventions:  Yes, provided   Follow up plan: No further intervention required.   Encounter Outcome:  Patient Visit Completed   David Ina RN,BSN,CCM [facility]  [facility], [facility] coordinator / Case Manager Phone: [phone]

## 2023-10-12 NOTE — Patient Instructions (Signed)
Visit Information  Thank you for taking time to visit with me today. Your care coordination goals have been met.   Discussed today:   Goals Addressed             This Visit's Progress    COMPLETED: Management of chronic health conditions and falls       Interventions Today    Flowsheet Row Most Recent Value  Chronic Disease   Chronic disease during today's visit Chronic Obstructive Pulmonary Disease (COPD), Other  [syncope/ falls]  General Interventions   General Interventions Discussed/Reviewed General Interventions Reviewed, Doctor Visits  [evaluation of current treatment plan for COPD/ syncope/ falls and patients adherence to plan as established by provider.  Assessed for COPD symptoms and syncope/fall episodes]  Doctor Visits Discussed/Reviewed Doctor Visits Reviewed  [name] to keep follow up with providers.]  Education Interventions   Education Provided Provided Education  [Reinforced need to drink fluids/ remain hydrated as recommended by provider.  Advised to notify neurologist for ongoing syncope episodes.]  Pharmacy Interventions   Pharmacy Dicussed/Reviewed Pharmacy Topics Reviewed  [name] to take medications as prescribed.]  Safety Interventions   Safety Discussed/Reviewed Fall Risk              Please contact your primary care provider if care coordination services needed in the future.   If you are experiencing a Mental Health or Behavioral Health Crisis or need someone to talk to, please call the Suicide and Crisis Lifeline: 988 call 1-800-273-TALK (toll free, 24 hour hotline)  Patient verbalizes understanding of instructions and care plan provided today and agrees to view in MyChart. Active MyChart status and patient understanding of how to access instructions and care plan via MyChart confirmed with patient.     George Ina RN,BSN,CCM [place]  [facility], [facility] coordinator / Case Manager Phone:  [phone]

## 2023-10-18 ENCOUNTER — Other Ambulatory Visit: Payer: Self-pay | Admitting: Internal Medicine

## 2023-10-26 ENCOUNTER — Encounter: Payer: Self-pay | Admitting: Internal Medicine

## 2023-10-26 ENCOUNTER — Ambulatory Visit (INDEPENDENT_AMBULATORY_CARE_PROVIDER_SITE_OTHER): Payer: Medicare Other | Admitting: Internal Medicine

## 2023-10-26 VITALS — BP 144/72 | HR 58 | Ht 70.0 in | Wt 185.6 lb

## 2023-10-26 DIAGNOSIS — R0781 Pleurodynia: Secondary | ICD-10-CM

## 2023-10-26 DIAGNOSIS — I5032 Chronic diastolic (congestive) heart failure: Secondary | ICD-10-CM | POA: Diagnosis not present

## 2023-10-26 DIAGNOSIS — R2689 Other abnormalities of gait and mobility: Secondary | ICD-10-CM

## 2023-10-26 DIAGNOSIS — R55 Syncope and collapse: Secondary | ICD-10-CM

## 2023-10-26 DIAGNOSIS — R350 Frequency of micturition: Secondary | ICD-10-CM

## 2023-10-26 MED ORDER — LIDOCAINE 5 % EX PTCH: 1.0000 | MEDICATED_PATCH | CUTANEOUS | 0 refills | Status: DC

## 2023-10-26 MED ORDER — METOPROLOL TARTRATE 25 MG PO TABS: ORAL_TABLET | ORAL | 1 refills | Status: DC

## 2023-10-26 NOTE — Assessment & Plan Note (Signed)
Secondary to aging, physical deconditioning.  He is requirinbg ore and more assistance from son and daughter,  as is his wife. Declies PT at this time.  Son requesting help with future placement .  [facility] In[facility] consult needed

## 2023-10-26 NOTE — Patient Instructions (Addendum)
Good to see you  You can continue your current remedies for your back pain   it is due to muscle strain caused by mild scoliosis  and degenerative disk disease f your lumbar spine  your prior rib fractures may also be causing pain from arthritis

## 2023-10-26 NOTE — Progress Notes (Unsigned)
Subjective:  Patient ID: David Atkins, male    DOB: 1928-12-20  Age: 89 y.o. MRN: [id number]  CC: {There were no encounter diagnoses. (Refresh or delete this SmartLink)}   HPI Alick Folks presents for  Chief Complaint  Patient presents with   Medical Management of Chronic Issues    1 month follow up    1) new onset right arm numbness which started this morning .  Exam normal   2)  HTN:    3) Frequent diarrhea  4) recurrent syncope: ongoing:  thus far neurology and cardiology have found no cause (EEG recently normal,   ZIO monitor and ECHO normal  2023)   5)  still having right sided rib pain following 8th rib fracture in June/July .  taking 1000 mg at 10 pm ,  another 1000 mg at 2 am.  Occasionally another 1000 mg in the morning IF NEEDED   6) FEELS WOBBLY ,  DIZZY WITHOUT VERTIGO  Son would like a thn care management consult to discuss long term care   Outpatient Medications Prior to Visit  Medication Sig Dispense Refill   amLODipine (NORVASC) 2.5 MG tablet TAKE 1 TABLET BY MOUTH DAILY 100 tablet 2   aspirin 81 MG EC tablet Take 81 mg by mouth daily.      atorvastatin (LIPITOR) 20 MG tablet TAKE 1 TABLET BY MOUTH DAILY 100 tablet 2   Blood Pressure Monitoring (BLOOD PRESSURE MONITOR AUTOMAT) DEVI Use daily to check blood pressure 1 Device 0   cholecalciferol (VITAMIN D) 25 MCG (1000 UNIT) tablet Take 1,000 Units by mouth daily.     cholestyramine (QUESTRAN) 4 GM/DOSE powder Take 0.5 packets (2 g total) by mouth 3 (three) times daily with meals. 378 g 12   finasteride (PROSCAR) 5 MG tablet TAKE 1 TABLET BY MOUTH DAILY 100 tablet 2   GEMTESA 75 MG TABS Take 1 tablet by mouth daily.     KLOR-CON M20 20 MEQ tablet TAKE 1 TABLET (20 MEQ TOTAL) BY MOUTH 2 (TWO) TIMES DAILY. WITH FOOD 4 tablet 0   lidocaine (LIDODERM) 5 % Place 1 patch onto the skin daily. Remove & Discard patch within 12 hours or as directed by MD 30 patch 1   lisinopril (ZESTRIL) 20 MG tablet TAKE 1 TABLET BY  MOUTH DAILY 100 tablet 2   Menthol-Camphor (TIGER BALM ARTHRITIS RUB EX) Apply 1 Application topically as needed.     metoprolol tartrate (LOPRESSOR) 25 MG tablet TAKE ONE-HALF TABLET BY MOUTH  TWICE DAILY 100 tablet 1   nitroGLYCERIN (NITROSTAT) 0.4 MG SL tablet Place 1 tablet (0.4 mg total) under the tongue every 5 (five) minutes as needed for chest pain. Maximum of 3 doses. 25 tablet 3   omeprazole (PRILOSEC) 20 MG capsule TAKE 2 CAPSULES BY MOUTH DAILY 200 capsule 2   oxybutynin (DITROPAN) 5 MG tablet TAKE 1 TABLET BY MOUTH TWICE  DAILY (Patient taking differently: Take 5 mg by mouth 2 (two) times daily.) 200 tablet 2   polyethylene glycol powder (GLYCOLAX/MIRALAX) powder Take 17 g by mouth once. Dissolve 1 capful of power into any liquid and drink once daily (Patient taking differently: Take 17 g by mouth at bedtime. Dissolve 1 capful of power into any liquid and drink once daily) 500 g 0   rOPINIRole (REQUIP) 0.25 MG tablet TAKE 1 TABLET BY MOUTH AT  BEDTIME (Patient taking differently: Take 0.25 mg by mouth at bedtime.) 100 tablet 2   Simethicone Extra Strength 125 MG CAPS  Take 125 mg by mouth as needed (gas).     Syringe/Needle, Disp, (SYRINGE 3CC/25GX1") 25G X 1" 3 ML MISC Use for b12 injections 50 each 0   tamsulosin (FLOMAX) 0.4 MG CAPS capsule TAKE 1 CAPSULE BY MOUTH DAILY  AFTER BREAKFAST (Patient taking differently: 0.4 mg daily after breakfast. TAKE 1 CAPSULE BY MOUTH  DAILY AFTER BREAKFAST) 100 capsule 2   triamcinolone cream (KENALOG) 0.1 % Apply 1 Application topically 2 (two) times daily. Until itching and rash resolve 80 g 0   No facility-administered medications prior to visit.    Review of Systems;  Patient denies headache, fevers, malaise, unintentional weight loss, skin rash, eye pain, sinus congestion and sinus pain, sore throat, dysphagia,  hemoptysis , cough, dyspnea, wheezing, chest pain, palpitations, orthopnea, edema, abdominal pain, nausea, melena, diarrhea,  constipation, flank pain, dysuria, hematuria, urinary  Frequency, nocturia, numbness, tingling, seizures,  Focal weakness, Loss of consciousness,  Tremor, insomnia, depression, anxiety, and suicidal ideation.      Objective:  BP (!) 144/72   Pulse (!) 58   Ht 5\' 10"  (1.778 m)   Wt 185 lb 9.6 oz (84.2 kg)   SpO2 95%   BMI 26.63 kg/m   BP Readings from Last 3 Encounters:  10/26/23 (!) 144/72  09/24/23 (!) 150/70  09/14/23 (!) 168/79    Wt Readings from Last 3 Encounters:  10/26/23 185 lb 9.6 oz (84.2 kg)  09/24/23 188 lb (85.3 kg)  09/13/23 185 lb (83.9 kg)    Physical Exam  Lab Results  Component Value Date   HGBA1C 5.7 11/17/2019   HGBA1C 5.7 02/08/2018   HGBA1C 5.6 11/02/2017    Lab Results  Component Value Date   CREATININE 0.78 09/13/2023   CREATININE 0.70 06/04/2023   CREATININE 0.73 03/08/2023    Lab Results  Component Value Date   WBC 8.2 09/13/2023   HGB 14.6 09/13/2023   HCT 45.4 09/13/2023   PLT 218 09/13/2023   GLUCOSE 118 (H) 09/13/2023   CHOL 98 12/07/2022   TRIG 92.0 12/07/2022   HDL 39.50 12/07/2022   LDLDIRECT CANCELED 11/02/2017   LDLCALC 40 12/07/2022   ALT 24 06/04/2023   AST 26 06/04/2023   NA 144 09/13/2023   K 4.4 09/13/2023   CL 104 09/13/2023   CREATININE 0.78 09/13/2023   BUN 15 09/13/2023   CO2 25 09/13/2023   TSH 1.70 06/04/2023   INR 1.2 12/27/2022   HGBA1C 5.7 11/17/2019   MICROALBUR <0.7 02/08/2018    No results found.  Assessment & Plan:  .There are no diagnoses linked to this encounter.   I provided 30 minutes of face-to-face time during this encounter reviewing patient's last visit with me, patient's  most recent visit with cardiology,  nephrology,  and neurology,  recent surgical and non surgical procedures, previous  labs and imaging studies, counseling on currently addressed issues,  and post visit ordering to diagnostics and therapeutics .   Follow-up: No follow-ups on file.   Sherlene Shams, MD

## 2023-10-28 NOTE — Assessment & Plan Note (Addendum)
Attributed to vasovagal episodes  after coprehensive cardiology and neurology evaluations, with no recent events.

## 2023-10-28 NOTE — Assessment & Plan Note (Signed)
Secondary to prior injury  resulting in fracture of 8 th (and remotely)  9th ribs. Recommend use of heating pads, tylenol and stretching

## 2023-10-29 ENCOUNTER — Telehealth: Payer: Self-pay | Admitting: *Deleted

## 2023-10-29 ENCOUNTER — Telehealth: Payer: Self-pay

## 2023-10-29 NOTE — Telephone Encounter (Signed)
*  Primary  Pharmacy Patient Advocate Encounter   Received notification from CoverMyMeds that prior authorization for Lidocaine 5% patches  is required/requested.   Insurance verification completed.   The patient is insured through [facility] .   Per test claim: PA required; PA submitted to above mentioned insurance via CoverMyMeds Key/confirmation #/EOC [id number] Status is pending

## 2023-10-29 NOTE — Progress Notes (Signed)
  Care Coordination  Outreach Note  10/29/2023 Name: David Atkins MRN: [id number] DOB: 11/25/1929   Care Coordination Outreach Attempts: An unsuccessful telephone outreach was attempted today to offer the patient information about available care coordination services.  Follow Up Plan:  Additional outreach attempts will be made to offer the patient care coordination information and services.   Encounter Outcome:  No Answer  Burman Nieves, CCMA Care Coordination Care Guide Direct Dial: [phone]

## 2023-10-30 NOTE — Progress Notes (Signed)
  Care Coordination  Outreach Note  10/30/2023 Name: David Atkins MRN: [id number] DOB: July 09, 1929   Care Coordination Outreach Attempts: An unsuccessful telephone outreach was attempted today to offer the patient information about available care coordination services.  Follow Up Plan:  Additional outreach attempts will be made to offer the patient care coordination information and services.   Encounter Outcome:  Patient Request to Call Back  Burman Nieves, [facility] Care Coordination Care Guide Direct Dial: [phone]

## 2023-11-05 ENCOUNTER — Other Ambulatory Visit (HOSPITAL_COMMUNITY): Payer: Self-pay

## 2023-11-05 NOTE — Telephone Encounter (Signed)
Noted  

## 2023-11-05 NOTE — Telephone Encounter (Signed)
Pharmacy Patient Advocate Encounter  Received notification from [facility] that Prior Authorization for Lidocaine 5% patches has been DENIED.  Full denial letter will be uploaded to the media tab. See denial reason below.   PA #/Case ID/Reference #: [id number]

## 2023-11-06 NOTE — Progress Notes (Signed)
  Care Coordination   Note   11/06/2023 Name: [name] MRN: [id number] DOB: 05-10-1929  David Atkins is a 89 y.o. year old male who sees Darrick Huntsman, Mar Daring, MD for primary care. I reached out to Billie Lade by phone today to offer care coordination services.  Mr. Soun was given information about Care Coordination services today including:   The Care Coordination services include support from the care team which includes your Nurse Coordinator, Clinical Social Worker, or Pharmacist.  The Care Coordination team is here to help remove barriers to the health concerns and goals most important to you. Care Coordination services are voluntary, and the patient may decline or stop services at any time by request to their care team member.   Care Coordination Consent Status: Patient agreed to services and verbal consent obtained.   Follow up plan:  Telephone appointment with care coordination team member scheduled for:  11/09/2023  Encounter Outcome:  Patient Scheduled from referral   Burman Nieves, [facility] Care Coordination Care Guide Direct Dial: [phone]

## 2023-11-09 ENCOUNTER — Ambulatory Visit: Payer: Self-pay | Admitting: Licensed Clinical Social Worker

## 2023-11-09 NOTE — Patient Outreach (Signed)
  Care Coordination   Initial Visit Note   11/09/2023 Name: David Atkins MRN: [id number] DOB: [birth year]  David Atkins is a 89 y.o. year old male who sees Darrick Huntsman, Mar Daring, MD for primary care. I spoke with  Billie Lade daughter Atlas Shihadeh by phone today.  What matters to the patients health and wellness today?  LCSW A Yakira Duquette and [employer] completed initial call. Ms. Mohring requested information regarding personal care assistance and possible assisted living options    Goals Addressed             This Visit's Progress    Care Coordination       Activities and task to complete in order to accomplish goals.           SDOH assessments and interventions completed:  Yes  SDOH Interventions Today    Flowsheet Row Most Recent Value  SDOH Interventions   Food Insecurity Interventions Intervention Not Indicated  Housing Interventions Intervention Not Indicated  Utilities Interventions Intervention Not Indicated        Care Coordination Interventions:  Yes, provided  Interventions Today    Flowsheet Row Most Recent Value  Chronic Disease   Chronic disease during today's visit Hypertension (HTN)  [Poor balance]  General Interventions   General Interventions Discussed/Reviewed Level of Care  Level of Care Personal Care Services, Assisted Living  [LCSW A. Felton Clinton information regarding PCA services and assisted living]  Education Interventions   Education Provided Provided Education  Provided Verbal Education On [facility]  [Informed [place] of affordable options for pca services.]  Safety Interventions   Safety Discussed/Reviewed Fall Risk       Follow up plan: No further intervention required.   Encounter Outcome:  Patient Visit Completed   Gwyndolyn Saxon MSW, LCSW Licensed Clinical Social Worker  [facility], Population Health Direct Dial: [phone]  Fax: [phone]

## 2023-11-10 ENCOUNTER — Other Ambulatory Visit: Payer: Self-pay | Admitting: Internal Medicine

## 2023-11-19 ENCOUNTER — Ambulatory Visit: Payer: Medicare Other | Admitting: Podiatry

## 2023-11-19 ENCOUNTER — Encounter: Payer: Self-pay | Admitting: Podiatry

## 2023-11-19 VITALS — Ht 70.0 in | Wt 185.6 lb

## 2023-11-19 DIAGNOSIS — B351 Tinea unguium: Secondary | ICD-10-CM

## 2023-11-19 DIAGNOSIS — M79675 Pain in left toe(s): Secondary | ICD-10-CM

## 2023-11-19 DIAGNOSIS — M79674 Pain in right toe(s): Secondary | ICD-10-CM

## 2023-11-19 DIAGNOSIS — I739 Peripheral vascular disease, unspecified: Secondary | ICD-10-CM

## 2023-11-19 NOTE — Progress Notes (Signed)
  Subjective:  Patient ID: David Atkins, male    DOB: 26-Nov-1929,  MRN: [id number]  89 y.o. male presents with at risk foot care. Patient has h/o PAD and painful elongated mycotic toenails 1-5 bilaterally which are tender when wearing enclosed shoe gear. Pain is relieved with periodic professional debridement. He is accompanied by his wife and a neighbor (friend) who are waiting in the office lobby for him. Chief Complaint  Patient presents with   Nail Problem    Pt is here for RFC not a diabetic PCP is Dr Darrick Huntsman and LOV was in November.   Marland Kitchen    PCP: Sherlene Shams, MD.  New problem(s): None.   Review of Systems: Negative except as noted in the HPI.   Allergies  Allergen Reactions   Sulfa Antibiotics Rash    Objective:  There were no vitals filed for this visit. Constitutional Patient is a pleasant 89 y.o. Caucasian male WD, WN in NAD. AAO x 3.  Vascular Capillary fill time to digits <4 seconds.  DP/PT pulse(s) are nonpalpable b/l lower extremities. Pedal hair absent b/l. Lower extremity skin temperature gradient warm to cool b/l. No pain with calf compression b/l. No cyanosis or clubbing noted. No ischemia nor gangrene noted b/l. Dependent edema noted b/l LE.  Neurologic Protective sensation intact 5/5 intact bilaterally with 10g monofilament b/l. Vibratory sensation intact b/l. No clonus b/l.   Dermatologic Pedal skin is thin, shiny and atrophic b/l.  No open wounds b/l lower extremities. No interdigital macerations b/l lower extremities. Toenails 1-5 b/l elongated, discolored, dystrophic, thickened, crumbly with subungual debris and tenderness to dorsal palpation. No hyperkeratotic nor porokeratotic lesions present on today's visit.  Orthopedic: Normal muscle strength 5/5 to all lower extremity muscle groups bilaterally. HAV with bunion deformity noted b/l LE. Utilizes rollator for ambulation assistance.   Last HgA1c:      No data to display           Assessment:   1. Pain  due to onychomycosis of toenails of both feet   2. PAD (peripheral artery disease) (HCC)    Plan:  Patient was evaluated and treated. All patient's and/or POA's questions/concerns addressed on today's visit. Toenails 1-5 debrided in length and girth without incident. Continue soft, supportive shoe gear daily. Report any pedal injuries to medical professional. Call office if there are any questions/concerns. -Patient/POA to call should there be question/concern in the interim.  Return in about 3 months (around 02/17/2024).  Freddie Breech, DPM      [place] LOCATION: [address] [place] [address]                   Office [phone]   [facility] LOCATION: [address] [place], [place] [address] Office [phone] Freddie Breech, DPM

## 2023-12-07 DIAGNOSIS — H6123 Impacted cerumen, bilateral: Secondary | ICD-10-CM | POA: Diagnosis not present

## 2023-12-07 DIAGNOSIS — H903 Sensorineural hearing loss, bilateral: Secondary | ICD-10-CM | POA: Diagnosis not present

## 2023-12-19 ENCOUNTER — Other Ambulatory Visit: Payer: Self-pay | Admitting: Internal Medicine

## 2023-12-20 DIAGNOSIS — Z961 Presence of intraocular lens: Secondary | ICD-10-CM | POA: Diagnosis not present

## 2023-12-20 DIAGNOSIS — H353132 Nonexudative age-related macular degeneration, bilateral, intermediate dry stage: Secondary | ICD-10-CM | POA: Diagnosis not present

## 2023-12-24 DIAGNOSIS — R55 Syncope and collapse: Secondary | ICD-10-CM | POA: Diagnosis not present

## 2023-12-24 DIAGNOSIS — R569 Unspecified convulsions: Secondary | ICD-10-CM | POA: Diagnosis not present

## 2023-12-24 DIAGNOSIS — R251 Tremor, unspecified: Secondary | ICD-10-CM | POA: Diagnosis not present

## 2023-12-24 DIAGNOSIS — Z9181 History of falling: Secondary | ICD-10-CM | POA: Diagnosis not present

## 2023-12-24 DIAGNOSIS — R531 Weakness: Secondary | ICD-10-CM | POA: Diagnosis not present

## 2024-01-01 DIAGNOSIS — D1801 Hemangioma of skin and subcutaneous tissue: Secondary | ICD-10-CM | POA: Diagnosis not present

## 2024-01-01 DIAGNOSIS — Z85828 Personal history of other malignant neoplasm of skin: Secondary | ICD-10-CM | POA: Diagnosis not present

## 2024-01-01 DIAGNOSIS — L578 Other skin changes due to chronic exposure to nonionizing radiation: Secondary | ICD-10-CM | POA: Diagnosis not present

## 2024-01-01 DIAGNOSIS — L281 Prurigo nodularis: Secondary | ICD-10-CM | POA: Diagnosis not present

## 2024-01-01 DIAGNOSIS — Z08 Encounter for follow-up examination after completed treatment for malignant neoplasm: Secondary | ICD-10-CM | POA: Diagnosis not present

## 2024-01-01 DIAGNOSIS — L821 Other seborrheic keratosis: Secondary | ICD-10-CM | POA: Diagnosis not present

## 2024-01-01 DIAGNOSIS — L291 Pruritus scroti: Secondary | ICD-10-CM | POA: Diagnosis not present

## 2024-01-01 DIAGNOSIS — L299 Pruritus, unspecified: Secondary | ICD-10-CM | POA: Diagnosis not present

## 2024-01-04 ENCOUNTER — Other Ambulatory Visit: Payer: Self-pay | Admitting: Internal Medicine

## 2024-02-07 DIAGNOSIS — L291 Pruritus scroti: Secondary | ICD-10-CM | POA: Diagnosis not present

## 2024-02-07 DIAGNOSIS — L299 Pruritus, unspecified: Secondary | ICD-10-CM | POA: Diagnosis not present

## 2024-02-18 ENCOUNTER — Ambulatory Visit: Payer: Medicare Other | Admitting: Podiatry

## 2024-03-06 DIAGNOSIS — L308 Other specified dermatitis: Secondary | ICD-10-CM | POA: Diagnosis not present

## 2024-03-06 DIAGNOSIS — D485 Neoplasm of uncertain behavior of skin: Secondary | ICD-10-CM | POA: Diagnosis not present

## 2024-03-06 DIAGNOSIS — L821 Other seborrheic keratosis: Secondary | ICD-10-CM | POA: Diagnosis not present

## 2024-03-06 DIAGNOSIS — L299 Pruritus, unspecified: Secondary | ICD-10-CM | POA: Diagnosis not present

## 2024-03-14 ENCOUNTER — Other Ambulatory Visit: Payer: Self-pay | Admitting: Internal Medicine

## 2024-03-20 ENCOUNTER — Encounter: Payer: Self-pay | Admitting: Internal Medicine

## 2024-04-05 ENCOUNTER — Emergency Department

## 2024-04-05 ENCOUNTER — Inpatient Hospital Stay
Admission: EM | Admit: 2024-04-05 | Discharge: 2024-04-10 | DRG: 593 | Disposition: A | Discharge status: Home / Self Care | Attending: Internal Medicine | Admitting: Internal Medicine

## 2024-04-05 ENCOUNTER — Other Ambulatory Visit: Payer: Self-pay

## 2024-04-05 DIAGNOSIS — I5032 Chronic diastolic (congestive) heart failure: Secondary | ICD-10-CM | POA: Diagnosis present

## 2024-04-05 DIAGNOSIS — L858 Other specified epidermal thickening: Secondary | ICD-10-CM | POA: Diagnosis not present

## 2024-04-05 DIAGNOSIS — Z66 Do not resuscitate: Secondary | ICD-10-CM | POA: Diagnosis present

## 2024-04-05 DIAGNOSIS — E785 Hyperlipidemia, unspecified: Secondary | ICD-10-CM | POA: Diagnosis not present

## 2024-04-05 DIAGNOSIS — R2241 Localized swelling, mass and lump, right lower limb: Secondary | ICD-10-CM | POA: Diagnosis not present

## 2024-04-05 DIAGNOSIS — L8990 Pressure ulcer of unspecified site, unspecified stage: Secondary | ICD-10-CM | POA: Insufficient documentation

## 2024-04-05 DIAGNOSIS — Z8673 Personal history of transient ischemic attack (TIA), and cerebral infarction without residual deficits: Secondary | ICD-10-CM

## 2024-04-05 DIAGNOSIS — B9561 Methicillin susceptible Staphylococcus aureus infection as the cause of diseases classified elsewhere: Secondary | ICD-10-CM | POA: Diagnosis present

## 2024-04-05 DIAGNOSIS — F411 Generalized anxiety disorder: Secondary | ICD-10-CM | POA: Diagnosis present

## 2024-04-05 DIAGNOSIS — I2510 Atherosclerotic heart disease of native coronary artery without angina pectoris: Secondary | ICD-10-CM | POA: Diagnosis present

## 2024-04-05 DIAGNOSIS — E876 Hypokalemia: Secondary | ICD-10-CM | POA: Diagnosis not present

## 2024-04-05 DIAGNOSIS — I739 Peripheral vascular disease, unspecified: Secondary | ICD-10-CM | POA: Diagnosis not present

## 2024-04-05 DIAGNOSIS — Z951 Presence of aortocoronary bypass graft: Secondary | ICD-10-CM

## 2024-04-05 DIAGNOSIS — R55 Syncope and collapse: Principal | ICD-10-CM | POA: Diagnosis present

## 2024-04-05 DIAGNOSIS — S2231XA Fracture of one rib, right side, initial encounter for closed fracture: Secondary | ICD-10-CM | POA: Diagnosis not present

## 2024-04-05 DIAGNOSIS — N390 Urinary tract infection, site not specified: Secondary | ICD-10-CM | POA: Insufficient documentation

## 2024-04-05 DIAGNOSIS — B9620 Unspecified Escherichia coli [E. coli] as the cause of diseases classified elsewhere: Secondary | ICD-10-CM | POA: Diagnosis present

## 2024-04-05 DIAGNOSIS — N400 Enlarged prostate without lower urinary tract symptoms: Secondary | ICD-10-CM | POA: Diagnosis present

## 2024-04-05 DIAGNOSIS — G4733 Obstructive sleep apnea (adult) (pediatric): Secondary | ICD-10-CM | POA: Diagnosis not present

## 2024-04-05 DIAGNOSIS — L299 Pruritus, unspecified: Secondary | ICD-10-CM | POA: Diagnosis not present

## 2024-04-05 DIAGNOSIS — G2581 Restless legs syndrome: Secondary | ICD-10-CM | POA: Diagnosis present

## 2024-04-05 DIAGNOSIS — K219 Gastro-esophageal reflux disease without esophagitis: Secondary | ICD-10-CM | POA: Diagnosis present

## 2024-04-05 DIAGNOSIS — I110 Hypertensive heart disease with heart failure: Secondary | ICD-10-CM | POA: Diagnosis present

## 2024-04-05 DIAGNOSIS — R7881 Bacteremia: Secondary | ICD-10-CM | POA: Diagnosis not present

## 2024-04-05 DIAGNOSIS — Z881 Allergy status to other antibiotic agents status: Secondary | ICD-10-CM

## 2024-04-05 DIAGNOSIS — Z8249 Family history of ischemic heart disease and other diseases of the circulatory system: Secondary | ICD-10-CM

## 2024-04-05 DIAGNOSIS — Z9049 Acquired absence of other specified parts of digestive tract: Secondary | ICD-10-CM

## 2024-04-05 DIAGNOSIS — G40909 Epilepsy, unspecified, not intractable, without status epilepticus: Secondary | ICD-10-CM | POA: Diagnosis present

## 2024-04-05 DIAGNOSIS — Z87891 Personal history of nicotine dependence: Secondary | ICD-10-CM

## 2024-04-05 DIAGNOSIS — Z1152 Encounter for screening for COVID-19: Secondary | ICD-10-CM | POA: Diagnosis not present

## 2024-04-05 DIAGNOSIS — I252 Old myocardial infarction: Secondary | ICD-10-CM | POA: Diagnosis not present

## 2024-04-05 DIAGNOSIS — E860 Dehydration: Secondary | ICD-10-CM | POA: Diagnosis not present

## 2024-04-05 DIAGNOSIS — Z83438 Family history of other disorder of lipoprotein metabolism and other lipidemia: Secondary | ICD-10-CM

## 2024-04-05 DIAGNOSIS — I10 Essential (primary) hypertension: Secondary | ICD-10-CM | POA: Diagnosis present

## 2024-04-05 DIAGNOSIS — L89311 Pressure ulcer of right buttock, stage 1: Principal | ICD-10-CM | POA: Diagnosis present

## 2024-04-05 DIAGNOSIS — K580 Irritable bowel syndrome with diarrhea: Secondary | ICD-10-CM | POA: Diagnosis not present

## 2024-04-05 DIAGNOSIS — H8110 Benign paroxysmal vertigo, unspecified ear: Secondary | ICD-10-CM | POA: Diagnosis present

## 2024-04-05 DIAGNOSIS — R4182 Altered mental status, unspecified: Secondary | ICD-10-CM

## 2024-04-05 DIAGNOSIS — Z85828 Personal history of other malignant neoplasm of skin: Secondary | ICD-10-CM

## 2024-04-05 DIAGNOSIS — Z79899 Other long term (current) drug therapy: Secondary | ICD-10-CM

## 2024-04-05 DIAGNOSIS — R404 Transient alteration of awareness: Secondary | ICD-10-CM | POA: Diagnosis not present

## 2024-04-05 DIAGNOSIS — Z7982 Long term (current) use of aspirin: Secondary | ICD-10-CM

## 2024-04-05 DIAGNOSIS — R6889 Other general symptoms and signs: Secondary | ICD-10-CM | POA: Diagnosis not present

## 2024-04-05 DIAGNOSIS — Z882 Allergy status to sulfonamides status: Secondary | ICD-10-CM

## 2024-04-05 LAB — URINALYSIS, W/ REFLEX TO CULTURE (INFECTION SUSPECTED)
Bilirubin Urine: NEGATIVE
Glucose, UA: NEGATIVE mg/dL
Hgb urine dipstick: NEGATIVE
Ketones, ur: NEGATIVE mg/dL
Nitrite: POSITIVE — AB
Protein, ur: 30 mg/dL — AB
Specific Gravity, Urine: 1.023 (ref 1.005–1.030)
pH: 5 (ref 5.0–8.0)

## 2024-04-05 LAB — CBC WITH DIFFERENTIAL/PLATELET
Abs Immature Granulocytes: 0.07 10*3/uL (ref 0.00–0.07)
Basophils Absolute: 0 10*3/uL (ref 0.0–0.1)
Basophils Relative: 0 %
Eosinophils Absolute: 0.1 10*3/uL (ref 0.0–0.5)
Eosinophils Relative: 1 %
HCT: 41.2 % (ref 39.0–52.0)
Hemoglobin: 13.7 g/dL (ref 13.0–17.0)
Immature Granulocytes: 1 %
Lymphocytes Relative: 12 %
Lymphs Abs: 1.3 10*3/uL (ref 0.7–4.0)
MCH: 33.4 pg (ref 26.0–34.0)
MCHC: 33.3 g/dL (ref 30.0–36.0)
MCV: 100.5 fL — ABNORMAL HIGH (ref 80.0–100.0)
Monocytes Absolute: 0.4 10*3/uL (ref 0.1–1.0)
Monocytes Relative: 4 %
Neutro Abs: 8.9 10*3/uL — ABNORMAL HIGH (ref 1.7–7.7)
Neutrophils Relative %: 82 %
Platelets: 227 10*3/uL (ref 150–400)
RBC: 4.1 MIL/uL — ABNORMAL LOW (ref 4.22–5.81)
RDW: 12.6 % (ref 11.5–15.5)
WBC: 10.7 10*3/uL — ABNORMAL HIGH (ref 4.0–10.5)
nRBC: 0 % (ref 0.0–0.2)

## 2024-04-05 LAB — COMPREHENSIVE METABOLIC PANEL WITH GFR
ALT: 22 U/L (ref 0–44)
AST: 25 U/L (ref 15–41)
Albumin: 3.7 g/dL (ref 3.5–5.0)
Alkaline Phosphatase: 77 U/L (ref 38–126)
Anion gap: 8 (ref 5–15)
BUN: 18 mg/dL (ref 8–23)
CO2: 27 mmol/L (ref 22–32)
Calcium: 8.9 mg/dL (ref 8.9–10.3)
Chloride: 106 mmol/L (ref 98–111)
Creatinine, Ser: 0.99 mg/dL (ref 0.61–1.24)
GFR, Estimated: >60 mL/min (ref >60)
Glucose, Bld: 145 mg/dL — ABNORMAL HIGH (ref 70–99)
Potassium: 4.1 mmol/L (ref 3.5–5.1)
Sodium: 141 mmol/L (ref 135–145)
Total Bilirubin: 0.8 mg/dL (ref 0.0–1.2)
Total Protein: 6.6 g/dL (ref 6.5–8.1)

## 2024-04-05 LAB — BLOOD CULTURE ID PANEL (REFLEXED) - BCID2: Meth resistant mecA/C and MREJ: NOT DETECTED

## 2024-04-05 LAB — LACTIC ACID, PLASMA
Lactic Acid, Venous: 1.7 mmol/L (ref 0.5–1.9)
Lactic Acid, Venous: 1.2 mmol/L (ref 0.5–1.9)

## 2024-04-05 LAB — TROPONIN I (HIGH SENSITIVITY)
Troponin I (High Sensitivity): 7 ng/L (ref <18)
Troponin I (High Sensitivity): 7 ng/L (ref <18)

## 2024-04-05 LAB — BRAIN NATRIURETIC PEPTIDE: B Natriuretic Peptide: 133.1 pg/mL — ABNORMAL HIGH (ref 0.0–100.0)

## 2024-04-05 LAB — RESP PANEL BY RT-PCR (RSV, FLU A&B, COVID)  RVPGX2
Influenza A by PCR: NEGATIVE
Influenza B by PCR: NEGATIVE
Resp Syncytial Virus by PCR: NEGATIVE
SARS Coronavirus 2 by RT PCR: NEGATIVE

## 2024-04-05 LAB — MAGNESIUM: Magnesium: 1.8 mg/dL (ref 1.7–2.4)

## 2024-04-05 MED ORDER — CEFAZOLIN SODIUM-DEXTROSE 2-4 GM/100ML-% IV SOLN
2.0000 g | Freq: Three times a day (TID) | INTRAVENOUS | Status: DC
Start: 2024-04-05 — End: 2024-04-10
  Administered 2024-04-05 – 2024-04-10 (×14): 2 g via INTRAVENOUS
  Filled 2024-04-05 (×14): qty 100

## 2024-04-05 MED ORDER — SODIUM CHLORIDE 0.9 % IV BOLUS
500.0000 mL | Freq: Once | INTRAVENOUS | Status: AC
  Administered 2024-04-05: 500 mL via INTRAVENOUS

## 2024-04-05 MED ORDER — SODIUM CHLORIDE 0.9 % IV SOLN
1.0000 g | Freq: Once | INTRAVENOUS | Status: AC
  Administered 2024-04-05: 1 g via INTRAVENOUS
  Filled 2024-04-05: qty 10

## 2024-04-05 MED ORDER — AMLODIPINE BESYLATE 5 MG PO TABS
2.5000 mg | ORAL_TABLET | Freq: Every day | ORAL | Status: DC
  Administered 2024-04-05 – 2024-04-10 (×6): 2.5 mg via ORAL
  Filled 2024-04-05 (×6): qty 1

## 2024-04-05 MED ORDER — ONDANSETRON HCL 4 MG PO TABS: 4.0000 mg | ORAL_TABLET | Freq: Four times a day (QID) | ORAL | Status: DC | PRN

## 2024-04-05 MED ORDER — LISINOPRIL 20 MG PO TABS
20.0000 mg | ORAL_TABLET | Freq: Every day | ORAL | Status: DC
  Administered 2024-04-05 – 2024-04-10 (×6): 20 mg via ORAL
  Filled 2024-04-05 (×4): qty 1
  Filled 2024-04-05: qty 2
  Filled 2024-04-05: qty 1

## 2024-04-05 MED ORDER — MELATONIN 5 MG PO TABS
5.0000 mg | ORAL_TABLET | Freq: Every evening | ORAL | Status: DC | PRN
  Administered 2024-04-09: 5 mg via ORAL
  Filled 2024-04-05: qty 1

## 2024-04-05 MED ORDER — SODIUM CHLORIDE 0.9 % IV SOLN: INTRAVENOUS | Status: AC

## 2024-04-05 MED ORDER — ENOXAPARIN SODIUM 40 MG/0.4ML IJ SOSY
40.0000 mg | PREFILLED_SYRINGE | INTRAMUSCULAR | Status: DC
  Administered 2024-04-05 – 2024-04-09 (×5): 40 mg via SUBCUTANEOUS
  Filled 2024-04-05 (×5): qty 0.4

## 2024-04-05 MED ORDER — PANTOPRAZOLE SODIUM 40 MG PO TBEC
40.0000 mg | DELAYED_RELEASE_TABLET | Freq: Every day | ORAL | Status: DC
  Administered 2024-04-05 – 2024-04-10 (×6): 40 mg via ORAL
  Filled 2024-04-05 (×6): qty 1

## 2024-04-05 MED ORDER — METOPROLOL TARTRATE 25 MG PO TABS
12.5000 mg | ORAL_TABLET | Freq: Two times a day (BID) | ORAL | Status: DC
  Administered 2024-04-05 – 2024-04-10 (×11): 12.5 mg via ORAL
  Filled 2024-04-05 (×11): qty 1

## 2024-04-05 MED ORDER — VITAMIN D 25 MCG (1000 UNIT) PO TABS
1000.0000 [IU] | ORAL_TABLET | Freq: Every day | ORAL | Status: DC
  Administered 2024-04-05 – 2024-04-10 (×6): 1000 [IU] via ORAL
  Filled 2024-04-05 (×6): qty 1

## 2024-04-05 MED ORDER — ATORVASTATIN CALCIUM 20 MG PO TABS
20.0000 mg | ORAL_TABLET | Freq: Every day | ORAL | Status: DC
  Administered 2024-04-05 – 2024-04-10 (×6): 20 mg via ORAL
  Filled 2024-04-05 (×6): qty 1

## 2024-04-05 MED ORDER — POLYETHYLENE GLYCOL 3350 17 G PO PACK
17.0000 g | PACK | Freq: Every day | ORAL | Status: DC | PRN
  Administered 2024-04-06: 17 g via ORAL
  Filled 2024-04-05 (×2): qty 1

## 2024-04-05 MED ORDER — FINASTERIDE 5 MG PO TABS
5.0000 mg | ORAL_TABLET | Freq: Every day | ORAL | Status: DC
  Administered 2024-04-05 – 2024-04-10 (×6): 5 mg via ORAL
  Filled 2024-04-05 (×6): qty 1

## 2024-04-05 MED ORDER — ACETAMINOPHEN 650 MG RE SUPP: 650.0000 mg | Freq: Four times a day (QID) | RECTAL | Status: DC | PRN

## 2024-04-05 MED ORDER — SODIUM CHLORIDE 0.9 % IV SOLN
2.0000 g | INTRAVENOUS | Status: DC
  Filled 2024-04-05: qty 20

## 2024-04-05 MED ORDER — ONDANSETRON HCL 4 MG/2ML IJ SOLN: 4.0000 mg | Freq: Four times a day (QID) | INTRAMUSCULAR | Status: DC | PRN

## 2024-04-05 MED ORDER — ACETAMINOPHEN 325 MG PO TABS: 650.0000 mg | ORAL_TABLET | Freq: Four times a day (QID) | ORAL | Status: DC | PRN

## 2024-04-05 NOTE — H&P (Addendum)
 History and Physical    Patient: David Atkins WUJ:[id number] DOB: 23-Feb-1929 DOA: 04/05/2024 DOS: the patient was seen and examined on 04/05/2024 PCP: Thersia Flax, MD  Patient coming from: Home  Chief Complaint:  Chief Complaint  Patient presents with   Near Syncope   HPI: David Atkins is a 89 y.o. male with medical history significant of  dCHF, hypertension, hyperlipidemia, COPD, CAD s/p CABG, GERD, BPH, BPPV, CVA found incidentally on imaging no symptoms, nonepileptic seizure (not taking medications), GAD, IBS, and OSA on CPAP.  He presents to [facility] regional ED via EMS from home after a near syncopal episode. Daughter reports in the past that he has had similar episodes of dizziness and incontinence of stool however last night he was also noted to be hypotensive (115/97-->95/59) which prompted them to call EMS. He remained alert and did not have loss of consciousness. He denies fevers, chills, headache, vision changes, chest pain, palpitations, dyspnea, abdominal pain, nausea, vomiting. He also denies dysuria but does endorse difficulty starting stream.   ED Course: On arrival to [facility] regional ED patient was noted to be afebrile temp 36.7 C, BP 130/72, HR 72, RR 14, SpO2 99% on room air.  Labs notable for mild leukocytosis WBC 10.7, 145, BMP 133.1, troponin 7 and flat.  UA positive for nitrites and leukocytes with many bacteria.  No lactic acidosis noted.  Blood and urine cultures collected and pending.  He was given IV Rocephin and a 500 mL normal saline bolus. TRH contacted for admission.  Review of Systems: As mentioned in the history of present illness. All other systems reviewed and are negative. Past Medical History:  Diagnosis Date   3-vessel coronary artery disease 2006   a.) s/p 3v revascularization (CABG)   Acute cholecystitis    Anxiety    Basal cell carcinoma 07/09/2012   Bilateral carotid artery stenosis    a.) doppler 07/13/2017L < 39% BICA; b.) doppler  11/05/2018: 40-59% RICA, 60-79% LICA; c.) doppler 11/11/2019, 11/16/2020, 11/15/2021: 1-39% RICA; 40-59% LICA; d.) doppler 11/24/2022: 1-39% BICA   BPH (benign prostatic hyperplasia)    BPPV (benign paroxysmal positional vertigo)    Cerebrovascular small vessel disease 01/02/2018   a.) brain MRI 01/02/2018: chronic small vessel ischemic changes within the cerebral white matter, basal ganglia and pons; b.) brain MRI 11/08/2025: progressive small vessel disease since previous MRI   Cholelithiasis    Chronic diastolic (congestive) heart failure (HCC)    a.) TTE 09/27/2022: EF 60-65%, mild MR, G1DD   Concussion w loss of consciousness of unsp duration, subs 12/29/2018   CVA (cerebral vascular accident) [facility])    a.) brain MRI 11/2022: lacunar infarcts within the bilateral centrum semiovale and left corona radiata   First degree heart block    Full incontinence of feces    GERD (gastroesophageal reflux disease)    Hx of CABG    Hyperlipidemia    Hypertension    Hypokalemia    IBS (irritable bowel syndrome)    Lower leg edema    Lumbar stenosis with neurogenic claudication 12/03/2013   Myocardial infarction [facility]) 2006   a.) Tx'd with 3v revascularization (CABG)   OAB (overactive bladder)    OSA on CPAP    Osteoporosis 2010   by DEXA at [facility]   PAD (peripheral artery disease) (HCC)    Poor balance    PSVT (paroxysmal supraventricular tachycardia) (HCC) 11/02/2022   a.) holter 11/02/2022: SVT x 128 episodes --> fastest 4 beats at a max rate of  179 bpm; longested 17 beats at average rate of 98 bpm   RLS (restless legs syndrome)    a.) on ropinirole    S/P CABG x 3 2006   Seizure (HCC)    Sepsis (HCC)    Sepsis (HCC) 12/27/2022   Spondylolisthesis of lumbar region 11/23/2013   MRI Lumbar spine   Umbilical hernia    Past Surgical History:  Procedure Laterality Date   APPENDECTOMY  1951   COLONOSCOPY     COLONOSCOPY WITH PROPOFOL  N/A 11/15/2015   Procedure: COLONOSCOPY WITH PROPOFOL ;   Surgeon: Stephens Eis, MD;  Location: [facility] ENDOSCOPY;  Service: Gastroenterology;  Laterality: N/A;   CORONARY ARTERY BYPASS GRAFT  2006   3 vessel, s/p AMI   INTRAOPERATIVE CHOLANGIOGRAM  03/13/2023   Procedure: INTRAOPERATIVE CHOLANGIOGRAM;  Surgeon: Alben Alma, MD;  Location: [facility] ORS;  Service: General;;   IR EXCHANGE BILIARY DRAIN  02/28/2023   IR PERC CHOLECYSTOSTOMY  12/27/2022   LEFT HEART CATH AND CORONARY ANGIOGRAPHY Left 2006   NASAL SEPTOPLASTY W/ TURBINOPLASTY  1981   TONSILLECTOMY     UMBILICAL HERNIA REPAIR N/A 03/13/2023   Procedure: HERNIA REPAIR UMBILICAL ADULT;  Surgeon: Alben Alma, MD;  Location: [facility] ORS;  Service: General;  Laterality: N/A;   Social History:  reports that he quit smoking about 60 years ago. His smoking use included cigarettes. He has never used smokeless tobacco. He reports that he does not drink alcohol and does not use drugs.  Allergies  Allergen Reactions   Sulfa Antibiotics Rash    Family History  Problem Relation Age of Onset   Hyperlipidemia Mother    Hypertension Mother    Hypertension Father     Prior to Admission medications   Medication Sig Start Date End Date Taking? Authorizing Provider  amLODipine  (NORVASC ) 2.5 MG tablet TAKE 1 TABLET BY MOUTH DAILY 09/17/23   Thersia Flax, MD  aspirin  81 MG EC tablet Take 81 mg by mouth daily.     [provider]  atorvastatin  (LIPITOR) 20 MG tablet TAKE 1 TABLET BY MOUTH DAILY 07/10/23   Thersia Flax, MD  Blood Pressure Monitoring (BLOOD PRESSURE MONITOR AUTOMAT) DEVI Use daily to check blood pressure 11/25/14   Thersia Flax, MD  cholecalciferol  (VITAMIN D ) 25 MCG (1000 UNIT) tablet Take 1,000 Units by mouth daily. 02/24/20   [provider]  cholestyramine  (QUESTRAN ) 4 GM/DOSE powder Take 0.5 packets (2 g total) by mouth 3 (three) times daily with meals. 09/24/23   Thersia Flax, MD  finasteride  (PROSCAR ) 5 MG tablet TAKE 1 TABLET BY MOUTH DAILY 03/17/24   Thersia Flax, MD  GEMTESA 75 MG TABS Take 1 tablet by mouth daily. 08/02/23   [provider]  KLOR-CON  M20 20 MEQ tablet TAKE 1 TABLET (20 MEQ TOTAL) BY MOUTH 2 (TWO) TIMES DAILY. WITH FOOD 03/08/23   Thersia Flax, MD  lidocaine  (LIDODERM ) 5 % Place 1 patch onto the skin daily. Remove & Discard patch within 12 hours or as directed by MD 06/01/23   Thersia Flax, MD  lidocaine  (LIDODERM ) 5 % Place 1 patch onto the skin daily. Remove & Discard patch within 12 hours or as directed by MD 10/26/23   Thersia Flax, MD  lisinopril  (ZESTRIL ) 20 MG tablet TAKE 1 TABLET BY MOUTH DAILY 09/17/23   Tullo, Teresa L, MD  Menthol-Camphor (TIGER BALM ARTHRITIS RUB EX) Apply 1 Application topically as needed.    [provider]  metoprolol  tartrate (LOPRESSOR ) 25 MG tablet TAKE ONE-HALF TABLET BY  MOUTH TWICE DAILY 10/26/23   Thersia Flax, MD  nitroGLYCERIN  (NITROSTAT ) 0.4 MG SL tablet Place 1 tablet (0.4 mg total) under the tongue every 5 (five) minutes as needed for chest pain. Maximum of 3 doses. 05/24/20   Gollan, Timothy J, MD  omeprazole  (PRILOSEC) 20 MG capsule TAKE 2 CAPSULES BY MOUTH DAILY 10/19/23   Tullo, Teresa L, MD  oxybutynin  (DITROPAN ) 5 MG tablet TAKE 1 TABLET BY MOUTH TWICE  DAILY 11/12/23   Tullo, Teresa L, MD  polyethylene glycol powder (GLYCOLAX /MIRALAX ) powder Take 17 g by mouth once. Dissolve 1 capful of power into any liquid and drink once daily Patient taking differently: Take 17 g by mouth at bedtime. Dissolve 1 capful of power into any liquid and drink once daily 11/01/15   Evone Hoh, MD  rOPINIRole  (REQUIP ) 0.25 MG tablet TAKE 1 TABLET BY MOUTH AT  BEDTIME 12/24/23   Thersia Flax, MD  Simethicone  Extra Strength 125 MG CAPS Take 125 mg by mouth as needed (gas).    [provider]  Syringe/Needle, Disp, (SYRINGE 3CC/25GX1") 25G X 1" 3 ML MISC Use for b12 injections 09/02/20   Thersia Flax, MD  tamsulosin  (FLOMAX ) 0.4 MG CAPS capsule TAKE 1 CAPSULE BY  MOUTH DAILY  AFTER BREAKFAST 01/07/24   Tullo, Teresa L, MD  triamcinolone  cream (KENALOG ) 0.1 % Apply 1 Application topically 2 (two) times daily. Until itching and rash resolve 08/16/23   Thersia Flax, MD    Physical Exam: Vitals:   04/05/24 0630 04/05/24 0800 04/05/24 0830 04/05/24 0942  BP: (!) 156/90 (!) 152/84 (!) 169/82   Pulse: 84 90 81   Resp: 13 (!) 21 13   Temp:    97.6 F (36.4 C)  TempSrc:    Oral  SpO2: 100% 100% 99%   Weight:      Height:        Constitutional: Elderly caucasian gentleman, NAD, calm, comfortable Eyes: PERRL, lids and conjunctivae normal ENMT: Mucous membranes are dry. Posterior pharynx clear of any exudate or lesions. Wears upper and lower dentures, not currently in place. Neck: normal, supple, no masses, no thyromegaly Respiratory: clear to auscultation bilaterally, no wheezing, no crackles. Normal respiratory effort. No accessory muscle use.  Cardiovascular: Regular rate and rhythm, no murmurs / rubs / gallops. BLE edema noted. 2+ radial and pedal pulses.  Abdomen: no tenderness, no masses palpated. No hepatosplenomegaly. Bowel sounds positive x4 quadrants.  Musculoskeletal: no clubbing / cyanosis. No joint deformity upper and lower extremities. Good ROM, no contractures. Normal muscle tone.  Skin: Warm, dry. No rashes, lesions, ulcers.  Neurologic: CN 2-12 grossly intact. Sensation intact, Alert and oriented x 3. Speech not clear, suspect secondary to dentures being out. No focal deficits noted.   Data Reviewed:  CBC    Component Value Date/Time   WBC 10.7 (H) 04/05/2024 0126   RBC 4.10 (L) 04/05/2024 0126   HGB 13.7 04/05/2024 0126   HGB 15.2 01/06/2014 1655   HCT 41.2 04/05/2024 0126   HCT 44.4 01/06/2014 1655   PLT 227 04/05/2024 0126   PLT 174 01/06/2014 1655   MCV 100.5 (H) 04/05/2024 0126   MCV 94 01/06/2014 1655   MCH 33.4 04/05/2024 0126   MCHC 33.3 04/05/2024 0126   RDW 12.6 04/05/2024 0126   RDW 13.3 01/06/2014 1655    LYMPHSABS 1.3 04/05/2024 0126   LYMPHSABS 1.5 12/01/2012 1106   MONOABS 0.4 04/05/2024  0126   MONOABS 0.6 12/01/2012 1106   EOSABS 0.1 04/05/2024 0126   EOSABS 0.2 12/01/2012 1106   BASOSABS 0.0 04/05/2024 0126   BASOSABS 0.0 12/01/2012 1106   CMP     Component Value Date/Time   NA 141 04/05/2024 0126   NA 143 01/06/2014 1655   K 4.1 04/05/2024 0126   K 3.6 01/06/2014 1655   CL 106 04/05/2024 0126   CL 109 (H) 01/06/2014 1655   CO2 27 04/05/2024 0126   CO2 27 01/06/2014 1655   GLUCOSE 145 (H) 04/05/2024 0126   GLUCOSE 88 01/06/2014 1655   BUN 18 04/05/2024 0126   BUN 16 01/06/2014 1655   CREATININE 0.99 04/05/2024 0126   CREATININE 0.93 08/27/2020 1457   CALCIUM  8.9 04/05/2024 0126   CALCIUM  9.0 01/06/2014 1655   PROT 6.6 04/05/2024 0126   PROT 7.1 01/06/2014 1655   ALBUMIN 3.7 04/05/2024 0126   ALBUMIN 3.8 01/06/2014 1655   AST 25 04/05/2024 0126   AST 26 01/06/2014 1655   ALT 22 04/05/2024 0126   ALT 33 01/06/2014 1655   ALKPHOS 77 04/05/2024 0126   ALKPHOS 62 01/06/2014 1655   BILITOT 0.8 04/05/2024 0126   BILITOT 0.4 01/06/2014 1655   GFR 80.23 06/04/2023 1445   GFRNONAA >60 04/05/2024 0126   GFRNONAA >60 01/06/2014 1655   Magnesium    Component Value Date/Time   MAGNESIUM 1.8 04/05/2024 0316    BNP    Component Value Date/Time   BNP 133.1 (H) 04/05/2024 0126    Cardiac Panel (last 3 results) Recent Labs    04/05/24 0126 04/05/24 0316  TROPONINIHS 7 7   Lactic Acid, Venous    Component Value Date/Time   LATICACIDVEN 1.2 04/05/2024 0316   Urinalysis    Component Value Date/Time   COLORURINE AMBER (A) 04/05/2024 0155   APPEARANCEUR HAZY (A) 04/05/2024 0155   APPEARANCEUR Clear 03/30/2017 1457   LABSPEC 1.023 04/05/2024 0155   PHURINE 5.0 04/05/2024 0155   GLUCOSEU NEGATIVE 04/05/2024 0155   GLUCOSEU NEGATIVE 08/22/2018 1450   HGBUR NEGATIVE 04/05/2024 0155   BILIRUBINUR NEGATIVE 04/05/2024 0155   BILIRUBINUR negative 10/13/2022 1639    BILIRUBINUR Negative 03/30/2017 1457   KETONESUR NEGATIVE 04/05/2024 0155   PROTEINUR 30 (A) 04/05/2024 0155   UROBILINOGEN 1.0 10/13/2022 1639   UROBILINOGEN 0.2 08/22/2018 1450   NITRITE POSITIVE (A) 04/05/2024 0155   LEUKOCYTESUR MODERATE (A) 04/05/2024 0155    Results for orders placed or performed during the hospital encounter of 04/05/24  Culture, blood (routine x 2)     Status: None (Preliminary result)   Collection Time: 04/05/24  1:26 AM   Specimen: BLOOD  Result Value Ref Range Status   Specimen Description BLOOD BLOOD LEFT ARM  Final   Special Requests   Final    BOTTLES DRAWN AEROBIC AND ANAEROBIC Blood Culture adequate volume   Culture   Final    NO GROWTH < 12 HOURS Performed at [facility], [address], [place], [place] [address]    Report Status PENDING  Incomplete  Culture, blood (routine x 2)     Status: None (Preliminary result)   Collection Time: 04/05/24  1:26 AM   Specimen: BLOOD  Result Value Ref Range Status   Specimen Description BLOOD BLOOD RIGHT ARM  Final   Special Requests   Final    BOTTLES DRAWN AEROBIC AND ANAEROBIC Blood Culture adequate volume   Culture   Final    NO GROWTH < 12  HOURS Performed at [facility], [address] [place], [place] [address]    Report Status PENDING  Incomplete  Resp panel by RT-PCR (RSV, Flu A&B, Covid) Anterior Nasal Swab     Status: None   Collection Time: 04/05/24  1:26 AM   Specimen: Anterior Nasal Swab  Result Value Ref Range Status   SARS Coronavirus 2 by RT PCR NEGATIVE NEGATIVE Final    Comment: (NOTE) SARS-CoV-2 target nucleic acids are NOT DETECTED.  The SARS-CoV-2 RNA is generally detectable in upper respiratory specimens during the acute phase of infection. The lowest concentration of SARS-CoV-2 viral copies this assay can detect is 138 copies/mL. A negative result does not preclude SARS-Cov-2 infection and should not be used as the sole basis for treatment or other  patient management decisions. A negative result may occur with  improper specimen collection/handling, submission of specimen other than nasopharyngeal swab, presence of viral mutation(s) within the areas targeted by this assay, and inadequate number of viral copies(<138 copies/mL). A negative result must be combined with clinical observations, patient history, and epidemiological information. The expected result is Negative.  Fact Sheet for Patients:  BloggerCourse.com  Fact Sheet for Healthcare Providers:  SeriousBroker.it  This test is no t yet approved or cleared by the [place]  FDA and  has been authorized for detection and/or diagnosis of SARS-CoV-2 by FDA under an Emergency Use Authorization (EUA). This EUA will remain  in effect (meaning this test can be used) for the duration of the COVID-19 declaration under Section 564(b)(1) of the Act, 21 U.S.C.section 360bbb-3(b)(1), unless the authorization is terminated  or revoked sooner.       Influenza A by PCR NEGATIVE NEGATIVE Final   Influenza B by PCR NEGATIVE NEGATIVE Final    Comment: (NOTE) The Xpert Xpress SARS-CoV-2/FLU/RSV plus assay is intended as an aid in the diagnosis of influenza from Nasopharyngeal swab specimens and should not be used as a sole basis for treatment. Nasal washings and aspirates are unacceptable for Xpert Xpress SARS-CoV-2/FLU/RSV testing.  Fact Sheet for Patients: BloggerCourse.com  Fact Sheet for Healthcare Providers: SeriousBroker.it  This test is not yet approved or cleared by the [place]  FDA and has been authorized for detection and/or diagnosis of SARS-CoV-2 by FDA under an Emergency Use Authorization (EUA). This EUA will remain in effect (meaning this test can be used) for the duration of the COVID-19 declaration under Section 564(b)(1) of the Act, 21 U.S.C. section  360bbb-3(b)(1), unless the authorization is terminated or revoked.     Resp Syncytial Virus by PCR NEGATIVE NEGATIVE Final    Comment: (NOTE) Fact Sheet for Patients: BloggerCourse.com  Fact Sheet for Healthcare Providers: SeriousBroker.it  This test is not yet approved or cleared by the [place]  FDA and has been authorized for detection and/or diagnosis of SARS-CoV-2 by FDA under an Emergency Use Authorization (EUA). This EUA will remain in effect (meaning this test can be used) for the duration of the COVID-19 declaration under Section 564(b)(1) of the Act, 21 U.S.C. section 360bbb-3(b)(1), unless the authorization is terminated or revoked.  Performed at [facility], [address] [place], [place] [address]     CT Head Wo Contrast Result Date: 04/05/2024 CLINICAL DATA:  Mental status change, unknown cause EXAM: CT HEAD WITHOUT CONTRAST TECHNIQUE: Contiguous axial images were obtained from the base of the skull through the vertex without intravenous contrast. RADIATION DOSE REDUCTION: This exam was performed according to the departmental dose-optimization program which includes automated exposure control,  adjustment of the mA and/or kV according to patient size and/or use of iterative reconstruction technique. COMPARISON:  CT head 05/05/2023. FINDINGS: Brain: No evidence of acute infarction, hemorrhage, hydrocephalus, extra-axial collection or mass lesion/mass effect. Similar patchy white matter hypodensities, compatible with chronic microvascular ischemic disease. Vascular: Calcific atherosclerosis. Skull: No acute fracture. Sinuses/Orbits: Paranasal sinus mucosal thickening. No acute orbital findings. Other: No mastoid effusions. IMPRESSION: No evidence of acute intracranial abnormality. Electronically Signed   By: Stevenson Elbe M.D.   On: 04/05/2024 01:03   DG Chest Port 1 View Result Date: 04/05/2024 CLINICAL DATA:   Altered mental status.  Syncopal episode. EXAM: PORTABLE CHEST 1 VIEW COMPARISON:  06/04/2023. FINDINGS: Remote right rib fracture. No consolidation. No visible pleural effusions or pneumothorax. Median sternotomy. IMPRESSION: No active disease. Electronically Signed   By: Stevenson Elbe M.D.   On: 04/05/2024 01:01    Assessment and Plan: ##Near Syncope - Orthostatic vital signs  - Gentle IVF- NS @ 50 mL/hr - Last ECHO 2023, repeat now - Cardiac Monitoring - Check Troponin: 7-->7 - CT Head: NEGATIVE - Neuro checks - PT/OT eval and treat  ##Urinary Tract Infection - Empirically covered with IV Rocephin, continue - Follow Urine culture   #Chronic diastolic CHF - Monitor volume and respiratory status with IVF - Daily weights - Strict I/Os - Repeat ECHO as above  #Hypertension - Continue home amlodipine , lisinopril , and metoprolol   #CAD - Continue home lipitor  #BPH - Continue home proscar  and Silodosin formulary alternative  #OSA - Continue home qHS CPAP  VTE prophylaxis: Lovenox   GI prophylaxis: Protonix   Diet: Heart Healthy Access: PIV Lines: NONE Code Status: DNR Telemetry: Yes Disposition: Observe on Med-Surg with Telemetry Patient is from: Home  Anticipated d/c is to: Home  Anticipated d/c date is: 1 day Patient currently: Pending ECHO, receiving IV antibiotics   Advance Care Planning:   Code Status: Limited: Do not attempt resuscitation (DNR) -DNR-LIMITED -Do Not Intubate/DNI    Consults: N/A  Family Communication: Daughter [name], updated via phone  Severity of Illness: The appropriate patient status for this patient is OBSERVATION. Observation status is judged to be reasonable and necessary in order to provide the required intensity of service to ensure the patient's safety. The patient's presenting symptoms, physical exam findings, and initial radiographic and laboratory data in the context of their medical condition is felt to place them at  decreased risk for further clinical deterioration. Furthermore, it is anticipated that the patient will be medically stable for discharge from the hospital within 2 midnights of admission.   To reach the provider On-Call:   7AM- 7PM see care teams to locate the attending and reach out to them via [email or web address] Password: [email or web address] 7PM-7AM contact night-coverage If you still have difficulty reaching the appropriate provider, please page the [facility] (Director on Call) for [facility] on amion for assistance  This document was prepared using Conservation officer, historic buildings and may include unintentional dictation errors.  Naida Austria FNP-BC, PMHNP-BC Nurse Practitioner [facility] [place]    ====================================== Attending Addendum  Patient was seen, examined,treatment plan was discussed with the Advance Practice Provider.  I have personally reviewed the clinical findings, labs, EKG, imaging studies and management of this patient in detail. I have also reviewed the orders written for this patient which were under my direction. I agree with the documentation above by the Advance Practice Provider.   David Atkins is a 89 y.o. male coming in for evaluation after near-syncope in the setting  of a hypotensive episode.  ED evaluation revealed UTI and patient has been started on empiric IV antibiotics pending cultures.  Appreciate Neurology input and agree with no need for inpatient EEG.    Montey Apa, DO [facility]

## 2024-04-05 NOTE — ED Notes (Signed)
 Patient transported to CT

## 2024-04-05 NOTE — Progress Notes (Signed)
 89 year old male with past medical history significant for hypertension hyperlipidemia, BPH, cholecystitis sp cholecystectomy on 03/13/23, CAD status post CABG, COPD, CVA found incidentally on imaging, IBS, OSA on CPAP presented to the ED via EMS from home after a near syncopal episode.  Daughter reported patient had episodes of dizziness and stool incontinence in the past.  He was hypotensive blood snide with BP over 90s over 50s. UA with with moderate leukocytes, nitrites.  Initially admitted for near syncopal episode and UTI, found to have:  # MSSA bacteremia 2/2 unclear source - 5/3 blood cultures grew 1/2 MSSA. - Chest x-ray showed no active disease - CT head showed no acute abnormality  Plan: -Discontinue ceftriaxone - Start cefazolin  - Repeat blood cultures tomorrow - Follow-up TTE - Official infectious disease consult to come Monday

## 2024-04-05 NOTE — ED Notes (Signed)
 Pt ambulatory with a walker to the bathroom. Standby assist provided.

## 2024-04-05 NOTE — ED Triage Notes (Signed)
 PT BIB [facility] for near syncopal episode with explosive loose stool. Systolic pressure bp at home 140 down to 90's in an hour at home.  [id number] Upon arrival to dept pt states he feels much better. That the spells have been happening for about 5 years & the dr told him there isn't much they can do about it.  Pt denies any pain at time of triage.

## 2024-04-05 NOTE — Progress Notes (Signed)
 PHARMACY - PHYSICIAN COMMUNICATION CRITICAL VALUE ALERT - BLOOD CULTURE IDENTIFICATION (BCID)  David Atkins is an 89 y.o. male who presented to [facility] on 04/05/2024 with a chief complaint of near syncope. Lab called to report 1 of 4 bottles with GPC (  Assessment: hypotension, , incontinence of stool and UTI  (include suspected source if known).  Name of physician (or Provider) Contacted: Dr Antoniette Batty  Current antibiotics: ceftriaxone 2gm IV q 24 hours  Changes to prescribed antibiotics recommended:  Per provider, likely contaminant. No change in therapy at this time unless more bottles come up positive.  Results for orders placed or performed during the hospital encounter of 04/05/24  Blood Culture ID Panel (Reflexed) (Collected: 04/05/2024  1:26 AM)  Result Value Ref Range   Enterococcus faecalis NOT DETECTED NOT DETECTED   Enterococcus Faecium NOT DETECTED NOT DETECTED   Listeria monocytogenes NOT DETECTED NOT DETECTED   Staphylococcus species PENDING NOT DETECTED   Staphylococcus aureus (BCID) DETECTED (A) NOT DETECTED   Staphylococcus epidermidis PENDING NOT DETECTED   Staphylococcus lugdunensis PENDING NOT DETECTED   Streptococcus species PENDING NOT DETECTED   Streptococcus agalactiae NOT DETECTED NOT DETECTED   Streptococcus pneumoniae NOT DETECTED NOT DETECTED   Streptococcus pyogenes NOT DETECTED NOT DETECTED   A.calcoaceticus-baumannii NOT DETECTED NOT DETECTED   Bacteroides fragilis NOT DETECTED NOT DETECTED   Enterobacterales PENDING NOT DETECTED   Enterobacter cloacae complex NOT DETECTED NOT DETECTED   Escherichia coli NOT DETECTED NOT DETECTED   Klebsiella aerogenes NOT DETECTED NOT DETECTED   Klebsiella oxytoca NOT DETECTED NOT DETECTED   Klebsiella pneumoniae NOT DETECTED NOT DETECTED   Proteus species NOT DETECTED NOT DETECTED   Salmonella species NOT DETECTED NOT DETECTED   Serratia marcescens NOT DETECTED NOT DETECTED   Haemophilus influenzae NOT DETECTED  NOT DETECTED   Neisseria meningitidis NOT DETECTED NOT DETECTED   Pseudomonas aeruginosa NOT DETECTED NOT DETECTED   Stenotrophomonas maltophilia NOT DETECTED NOT DETECTED   Candida albicans NOT DETECTED NOT DETECTED   Candida auris NOT DETECTED NOT DETECTED   Candida glabrata NOT DETECTED NOT DETECTED   Candida krusei NOT DETECTED NOT DETECTED   Candida parapsilosis NOT DETECTED NOT DETECTED   Candida tropicalis NOT DETECTED NOT DETECTED   Cryptococcus neoformans/gattii NOT DETECTED NOT DETECTED   CTX-M ESBL PENDING NOT DETECTED   Carbapenem resistance IMP PENDING NOT DETECTED   Carbapenem resistance KPC PENDING NOT DETECTED   Meth resistant mecA/C and MREJ NOT DETECTED NOT DETECTED   Carbapenem resistance NDM PENDING NOT DETECTED   Carbapenem resist OXA 48 LIKE PENDING NOT DETECTED   Carbapenem resistance VIM PENDING NOT DETECTED    Kairon Shock Rodriguez-Guzman PharmD, BCPS 04/05/2024 7:12 PM

## 2024-04-05 NOTE — Plan of Care (Signed)
 Discussed briefly with Naida Austria, NP and chart briefly reviewed   89 y.o. with non-epileptic episodes presenting with his typical episode now back to baseline   In this setting continued outpatient follow-up is appropriate (last seen by Dr. Walden Guise in Jan 2025), no need for inpatient EEG if back to baseline   Please do not hesitate to reach out for full consult if acute inpatient neurological concerns arise  These are curbside recommendations based upon the information readily available in the chart on brief review as well as history and examination information provided to me by requesting provider and do not replace a full detailed consult

## 2024-04-05 NOTE — ED Notes (Signed)
 Pt ambulated to the bathroom with x1 standby assist with walker. Pt gait steady.

## 2024-04-05 NOTE — Hospital Course (Addendum)
 David Atkins is a 89 y.o. male with medical history significant of  dCHF, hypertension, hyperlipidemia, COPD, CAD s/p CABG, GERD, BPH, BPPV, CVA found incidentally on imaging no symptoms, nonepileptic seizure (not taking medications), GAD, IBS, and OSA on CPAP.  He presents to [facility] ED via EMS from home after a near syncopal episode. Daughter reports in the past that he has had similar episodes of dizziness and incontinence of stool however last night he was also noted to be hypotensive (115/97-->95/59) which prompted them to call EMS. He remained alert and did not have loss of consciousness. He denies fevers, chills, headache, vision changes, chest pain, palpitations, dyspnea, abdominal pain, nausea, vomiting. He also denies dysuria but does endorse difficulty starting stream

## 2024-04-05 NOTE — ED Notes (Signed)
 Pt ambulated to the bathroom at this time. NT Mykia to take pt up.

## 2024-04-05 NOTE — ED Provider Notes (Signed)
 [facility] Provider Note    Event Date/Time   First MD Initiated Contact with Patient 04/05/24 0034     (approximate)   History   Near Syncope   HPI  David Atkins is a 89 y.o. male brought to the ED via EMS from home with a chief complaint of near syncope.  Patient reports history of nonepileptic seizures with similar symptoms of dizziness and incontinence of stool.  However, family states this time patient's blood pressure was low which is why they called EMS.  Patient denies fever/chills, headache, neck pain, vision changes, chest pain, shortness of breath, abdominal pain, nausea, vomiting or dizziness.     Past Medical History   Past Medical History:  Diagnosis Date  . 3-vessel coronary artery disease 2006   a.) s/p 3v revascularization (CABG)  . Acute cholecystitis   . Anxiety   . Basal cell carcinoma 07/09/2012  . Bilateral carotid artery stenosis    a.) doppler 07/13/2017L < 39% BICA; b.) doppler 11/05/2018: 40-59% RICA, 60-79% LICA; c.) doppler 11/11/2019, 11/16/2020, 11/15/2021: 1-39% RICA; 40-59% LICA; d.) doppler 11/24/2022: 1-39% BICA  . BPH (benign prostatic hyperplasia)   . BPPV (benign paroxysmal positional vertigo)   . Cerebrovascular small vessel disease 01/02/2018   a.) brain MRI 01/02/2018: chronic small vessel ischemic changes within the cerebral white matter, basal ganglia and pons; b.) brain MRI 11/08/2025: progressive small vessel disease since previous MRI  . Cholelithiasis   . Chronic diastolic (congestive) heart failure (HCC)    a.) TTE 09/27/2022: EF 60-65%, mild MR, G1DD  . Concussion w loss of consciousness of unsp duration, subs 12/29/2018  . CVA (cerebral vascular accident) [facility])    a.) brain MRI 11/2022: lacunar infarcts within the bilateral centrum semiovale and left corona radiata  . First degree heart block   . Full incontinence of feces   . GERD (gastroesophageal reflux disease)   . Hx of CABG   .  Hyperlipidemia   . Hypertension   . Hypokalemia   . IBS (irritable bowel syndrome)   . Lower leg edema   . Lumbar stenosis with neurogenic claudication 12/03/2013  . Myocardial infarction [facility]) 2006   a.) Tx'd with 3v revascularization (CABG)  . OAB (overactive bladder)   . OSA on CPAP   . Osteoporosis 2010   by DEXA at [facility]  . PAD (peripheral artery disease) (HCC)   . Poor balance   . PSVT (paroxysmal supraventricular tachycardia) (HCC) 11/02/2022   a.) holter 11/02/2022: SVT x 128 episodes --> fastest 4 beats at a max rate of 179 bpm; longested 17 beats at average rate of 98 bpm  . RLS (restless legs syndrome)    a.) on ropinirole   . S/P CABG x 3 2006  . Seizure (HCC)   . Sepsis (HCC)   . Sepsis (HCC) 12/27/2022  . Spondylolisthesis of lumbar region 11/23/2013   MRI Lumbar spine  . Umbilical hernia      Active Problem List   Patient Active Problem List   Diagnosis Date Noted  . Post-cholecystectomy syndrome 09/25/2023  . Do not resuscitate discussion 09/25/2023  . Do not resuscitate 09/25/2023  . Rib pain on right side 06/04/2023  . Syncope and collapse 06/04/2023  . Ventral hernia without obstruction or gangrene 03/13/2023  . Generalized weakness 01/02/2023  . CAP (community acquired pneumonia) 12/27/2022  . CAD (coronary artery disease) 12/27/2022  . Chronic diastolic CHF (congestive heart failure) (HCC) 12/27/2022  . Hypokalemia 12/27/2022  . Atherosclerosis of  aortic bifurcation and common iliac arteries (HCC) 12/09/2022  . Cholelithiasis 12/07/2022  . Full incontinence of feces 10/24/2022  . Laceration of left hand without foreign body 09/27/2022  . OAB (overactive bladder)   . Allergic rhinitis 03/29/2020  . Allergic conjunctivitis 03/29/2020  . Pharyngoesophageal dysphagia 07/07/2019  . Hospital discharge follow-up 12/29/2018  . PAD (peripheral artery disease) (HCC) 10/01/2018  . Cerebrovascular disease 02/07/2018  . CAD (coronary artery disease), native  coronary artery 11/04/2017  . Vertigo 10/11/2017  . Bilateral leg edema 07/18/2017  . RLS (restless legs syndrome) 07/18/2017  . Hx of CABG 09/21/2016  . Poor balance 12/04/2015  . IBS (irritable bowel syndrome) 11/03/2015  . Generalized anxiety disorder 01/20/2015  . Flatulence, eructation and gas pain 11/28/2014  . Overweight 05/24/2014  . Vitamin D  deficiency 05/22/2014  . OSA on CPAP 01/19/2014  . Lumbar pain with radiation down left leg 12/01/2013  . Encounter for preventive health examination 01/13/2013  . GERD (gastroesophageal reflux disease) 01/06/2013  . Bilateral carotid artery stenosis 12/07/2012  . Hyperlipidemia LDL goal <70 07/09/2012  . Impaired glucose tolerance 07/09/2012  . BPPV (benign paroxysmal positional vertigo) 07/09/2012  . Osteoporosis   . BPH (benign prostatic hyperplasia)   . Hypertension      Past Surgical History   Past Surgical History:  Procedure Laterality Date  . APPENDECTOMY  1951  . COLONOSCOPY    . COLONOSCOPY WITH PROPOFOL  N/A 11/15/2015   Procedure: COLONOSCOPY WITH PROPOFOL ;  Surgeon: Stephens Eis, MD;  Location: [facility] ENDOSCOPY;  Service: Gastroenterology;  Laterality: N/A;  . CORONARY ARTERY BYPASS GRAFT  2006   3 vessel, s/p AMI  . INTRAOPERATIVE CHOLANGIOGRAM  03/13/2023   Procedure: INTRAOPERATIVE CHOLANGIOGRAM;  Surgeon: Alben Alma, MD;  Location: [facility] ORS;  Service: General;;  . IR EXCHANGE BILIARY DRAIN  02/28/2023  . IR PERC CHOLECYSTOSTOMY  12/27/2022  . LEFT HEART CATH AND CORONARY ANGIOGRAPHY Left 2006  . NASAL SEPTOPLASTY W/ TURBINOPLASTY  1981  . TONSILLECTOMY    . UMBILICAL HERNIA REPAIR N/A 03/13/2023   Procedure: HERNIA REPAIR UMBILICAL ADULT;  Surgeon: Alben Alma, MD;  Location: [facility] ORS;  Service: General;  Laterality: N/A;     Home Medications   Prior to Admission medications   Medication Sig Start Date End Date Taking? Authorizing Provider  amLODipine  (NORVASC ) 2.5 MG tablet TAKE 1 TABLET BY MOUTH DAILY  09/17/23   Thersia Flax, MD  aspirin  81 MG EC tablet Take 81 mg by mouth daily.     [provider]  atorvastatin  (LIPITOR) 20 MG tablet TAKE 1 TABLET BY MOUTH DAILY 07/10/23   Thersia Flax, MD  Blood Pressure Monitoring (BLOOD PRESSURE MONITOR AUTOMAT) DEVI Use daily to check blood pressure 11/25/14   Thersia Flax, MD  cholecalciferol  (VITAMIN D ) 25 MCG (1000 UNIT) tablet Take 1,000 Units by mouth daily. 02/24/20   [provider]  cholestyramine  (QUESTRAN ) 4 GM/DOSE powder Take 0.5 packets (2 g total) by mouth 3 (three) times daily with meals. 09/24/23   Thersia Flax, MD  finasteride  (PROSCAR ) 5 MG tablet TAKE 1 TABLET BY MOUTH DAILY 03/17/24   Thersia Flax, MD  GEMTESA 75 MG TABS Take 1 tablet by mouth daily. 08/02/23   [provider]  KLOR-CON  M20 20 MEQ tablet TAKE 1 TABLET (20 MEQ TOTAL) BY MOUTH 2 (TWO) TIMES DAILY. WITH FOOD 03/08/23   Thersia Flax, MD  lidocaine  (LIDODERM ) 5 % Place 1 patch onto the skin daily.  Remove & Discard patch within 12 hours or as directed by MD 06/01/23   Thersia Flax, MD  lidocaine  (LIDODERM ) 5 % Place 1 patch onto the skin daily. Remove & Discard patch within 12 hours or as directed by MD 10/26/23   Thersia Flax, MD  lisinopril  (ZESTRIL ) 20 MG tablet TAKE 1 TABLET BY MOUTH DAILY 09/17/23   Tullo, Teresa L, MD  Menthol-Camphor (TIGER BALM ARTHRITIS RUB EX) Apply 1 Application topically as needed.    [provider]  metoprolol  tartrate (LOPRESSOR ) 25 MG tablet TAKE ONE-HALF TABLET BY  MOUTH TWICE DAILY 10/26/23   Thersia Flax, MD  nitroGLYCERIN  (NITROSTAT ) 0.4 MG SL tablet Place 1 tablet (0.4 mg total) under the tongue every 5 (five) minutes as needed for chest pain. Maximum of 3 doses. 05/24/20   Gollan, Timothy J, MD  omeprazole  (PRILOSEC) 20 MG capsule TAKE 2 CAPSULES BY MOUTH DAILY 10/19/23   Thersia Flax, MD  oxybutynin  (DITROPAN ) 5 MG tablet TAKE 1 TABLET BY MOUTH TWICE  DAILY 11/12/23   Tullo, Teresa  L, MD  polyethylene glycol powder (GLYCOLAX /MIRALAX ) powder Take 17 g by mouth once. Dissolve 1 capful of power into any liquid and drink once daily Patient taking differently: Take 17 g by mouth at bedtime. Dissolve 1 capful of power into any liquid and drink once daily 11/01/15   Evone Hoh, MD  rOPINIRole  (REQUIP ) 0.25 MG tablet TAKE 1 TABLET BY MOUTH AT  BEDTIME 12/24/23   Thersia Flax, MD  Simethicone  Extra Strength 125 MG CAPS Take 125 mg by mouth as needed (gas).    [provider]  Syringe/Needle, Disp, (SYRINGE 3CC/25GX1") 25G X 1" 3 ML MISC Use for b12 injections 09/02/20   Thersia Flax, MD  tamsulosin  (FLOMAX ) 0.4 MG CAPS capsule TAKE 1 CAPSULE BY MOUTH DAILY  AFTER BREAKFAST 01/07/24   Tullo, Teresa L, MD  triamcinolone  cream (KENALOG ) 0.1 % Apply 1 Application topically 2 (two) times daily. Until itching and rash resolve 08/16/23   Thersia Flax, MD     Allergies  Sulfa antibiotics   Family History   Family History  Problem Relation Age of Onset  . Hyperlipidemia Mother   . Hypertension Mother   . Hypertension Father      Physical Exam  Triage Vital Signs: ED Triage Vitals  Encounter Vitals Group     BP 04/05/24 0043 130/72     Systolic BP Percentile --      Diastolic BP Percentile --      Pulse Rate 04/05/24 0043 72     Resp 04/05/24 0043 14     Temp 04/05/24 0043 98 F (36.7 C)     Temp Source 04/05/24 0043 Oral     SpO2 04/05/24 0043 99 %     Weight 04/05/24 0044 187 lb 6.3 oz (85 kg)     Height 04/05/24 0044 5\' 10"  (1.778 m)     Head Circumference --      Peak Flow --      Pain Score 04/05/24 0044 0     Pain Loc --      Pain Education --      Exclude from Growth Chart --     Updated Vital Signs: BP 130/72 (BP Location: Left Arm)   Pulse 72   Temp 98 F (36.7 C) (Oral)   Resp 14   Ht 5\' 10"  (1.778 m)   Wt 85 kg   SpO2 99%   BMI  26.89 kg/m    General: Awake, no distress.  CV:  RRR.  Good peripheral perfusion.   Resp:  Normal effort.  CTAB. Abd:  Nontender.  No distention.  Other:  Alert and oriented to person and place.  CN II-XII grossly intact.  5/5 motor strength and sensation all extremities.  MAE x 4.   ED Results / Procedures / Treatments  Labs (all labs ordered are listed, but only abnormal results are displayed) Labs Reviewed  CBC WITH DIFFERENTIAL/PLATELET - Abnormal; Notable for the following components:      Result Value   WBC 10.7 (*)    RBC 4.10 (*)    MCV 100.5 (*)    Neutro Abs 8.9 (*)    All other components within normal limits  COMPREHENSIVE METABOLIC PANEL WITH GFR - Abnormal; Notable for the following components:   Glucose, Bld 145 (*)    All other components within normal limits  BRAIN NATRIURETIC PEPTIDE - Abnormal; Notable for the following components:   B Natriuretic Peptide 133.1 (*)    All other components within normal limits  URINALYSIS, W/ REFLEX TO CULTURE (INFECTION SUSPECTED) - Abnormal; Notable for the following components:   Color, Urine AMBER (*)    APPearance HAZY (*)    Protein, ur 30 (*)    Nitrite POSITIVE (*)    Leukocytes,Ua MODERATE (*)    Bacteria, UA MANY (*)    All other components within normal limits  RESP PANEL BY RT-PCR (RSV, FLU A&B, COVID)  RVPGX2  CULTURE, BLOOD (ROUTINE X 2)  CULTURE, BLOOD (ROUTINE X 2)  URINE CULTURE  LACTIC ACID, PLASMA  LACTIC ACID, PLASMA  TROPONIN I (HIGH SENSITIVITY)  TROPONIN I (HIGH SENSITIVITY)     EKG  ED ECG REPORT I, Machael Raine J, the attending physician, personally viewed and interpreted this ECG.   Date: 04/05/2024  EKG Time: 0109  Rate: 74  Rhythm: normal sinus rhythm  Axis: Normal  Intervals: Artifact  ST&T Change: Nonspecific    RADIOLOGY I have independently visualized interpreted patient's imaging studies as well as noted the radiology interpretation:  Chest x-ray: No acute cardiopulmonary process  CT head: No ICH  Official radiology report(s): CT Head Wo  Contrast Result Date: 04/05/2024 CLINICAL DATA:  Mental status change, unknown cause EXAM: CT HEAD WITHOUT CONTRAST TECHNIQUE: Contiguous axial images were obtained from the base of the skull through the vertex without intravenous contrast. RADIATION DOSE REDUCTION: This exam was performed according to the departmental dose-optimization program which includes automated exposure control, adjustment of the mA and/or kV according to patient size and/or use of iterative reconstruction technique. COMPARISON:  CT head 05/05/2023. FINDINGS: Brain: No evidence of acute infarction, hemorrhage, hydrocephalus, extra-axial collection or mass lesion/mass effect. Similar patchy white matter hypodensities, compatible with chronic microvascular ischemic disease. Vascular: Calcific atherosclerosis. Skull: No acute fracture. Sinuses/Orbits: Paranasal sinus mucosal thickening. No acute orbital findings. Other: No mastoid effusions. IMPRESSION: No evidence of acute intracranial abnormality. Electronically Signed   By: Stevenson Elbe M.D.   On: 04/05/2024 01:03   DG Chest Port 1 View Result Date: 04/05/2024 CLINICAL DATA:  Altered mental status.  Syncopal episode. EXAM: PORTABLE CHEST 1 VIEW COMPARISON:  06/04/2023. FINDINGS: Remote right rib fracture. No consolidation. No visible pleural effusions or pneumothorax. Median sternotomy. IMPRESSION: No active disease. Electronically Signed   By: Stevenson Elbe M.D.   On: 04/05/2024 01:01     PROCEDURES:  Critical Care performed: No  .1-3 Lead EKG Interpretation  Performed by: Giankarlo Leamer  J, MD Authorized by: Norlene Beavers, MD     Interpretation: normal     ECG rate:  75   ECG rate assessment: normal     Rhythm: sinus rhythm     Ectopy: none     Conduction: normal   Comments:     Patient placed on cardiac monitor to evaluate for arrhythmias    MEDICATIONS ORDERED IN ED: Medications  cefTRIAXone (ROCEPHIN) 1 g in sodium chloride  0.9 % 100 mL IVPB (1 g  Intravenous New Bag/Given 04/05/24 0240)  sodium chloride  0.9 % bolus 500 mL (0 mLs Intravenous Stopped 04/05/24 0241)     IMPRESSION / MDM / ASSESSMENT AND PLAN / ED COURSE  I reviewed the triage vital signs and the nursing notes.                             89 year old male presenting with near syncope.  Differential diagnosis includes but is not limited to CVA, ACS, infectious, metabolic etiologies, etc.  I personally reviewed patient's records and note a neurology office visit from 12/24/2023 for syncope, fecal incontinence, generalized weakness, seizure-like activity.  Patient's presentation is most consistent with acute complicated illness / injury requiring diagnostic workup.  The patient is on the cardiac monitor to evaluate for evidence of arrhythmia and/or significant heart rate changes.  Will obtain cardiac panel, CT head, administer IV fluids.  Will reassess.  Clinical Course as of 04/05/24 0258  Sat Apr 05, 2024  [id number] Laboratory results demonstrate minimal leukocytosis with WBC 10.7, unremarkable electrolytes, negative troponin, negative respiratory panel.  Chest x-ray is clear.  CT head negative for ICH.  UA positive for nitrite and leukocyte positive UTI.  Lactic acid is negative.  Will administer IV Rocephin.  Family member notes patient was altered yesterday, more agitated than usual and unlike himself.  Will consult hospitalist services for evaluation and admission. [JS]    Clinical Course User Index [JS] Norlene Beavers, MD     FINAL CLINICAL IMPRESSION(S) / ED DIAGNOSES   Final diagnoses:  Near syncope  Lower urinary tract infectious disease  Altered mental status, unspecified altered mental status type     Rx / DC Orders   ED Discharge Orders     None        Note:  This document was prepared using Dragon voice recognition software and may include unintentional dictation errors.   Karron Alvizo J, MD 04/05/24 [phone]

## 2024-04-05 NOTE — ED Notes (Signed)
 Called [facility]CMD at this time to place pt on their board. Order was placed at 0036 this morning.

## 2024-04-06 ENCOUNTER — Observation Stay
Admit: 2024-04-06 | Discharge: 2024-04-06 | Disposition: A | Discharge status: Home / Self Care | Attending: Internal Medicine | Admitting: Internal Medicine

## 2024-04-06 DIAGNOSIS — I739 Peripheral vascular disease, unspecified: Secondary | ICD-10-CM | POA: Diagnosis present

## 2024-04-06 DIAGNOSIS — I252 Old myocardial infarction: Secondary | ICD-10-CM | POA: Diagnosis not present

## 2024-04-06 DIAGNOSIS — K580 Irritable bowel syndrome with diarrhea: Secondary | ICD-10-CM | POA: Diagnosis present

## 2024-04-06 DIAGNOSIS — L89311 Pressure ulcer of right buttock, stage 1: Secondary | ICD-10-CM | POA: Diagnosis present

## 2024-04-06 DIAGNOSIS — L858 Other specified epidermal thickening: Secondary | ICD-10-CM | POA: Diagnosis not present

## 2024-04-06 DIAGNOSIS — F411 Generalized anxiety disorder: Secondary | ICD-10-CM | POA: Diagnosis present

## 2024-04-06 DIAGNOSIS — Z1152 Encounter for screening for COVID-19: Secondary | ICD-10-CM | POA: Diagnosis not present

## 2024-04-06 DIAGNOSIS — E876 Hypokalemia: Secondary | ICD-10-CM | POA: Diagnosis present

## 2024-04-06 DIAGNOSIS — L299 Pruritus, unspecified: Secondary | ICD-10-CM | POA: Diagnosis not present

## 2024-04-06 DIAGNOSIS — Z66 Do not resuscitate: Secondary | ICD-10-CM | POA: Diagnosis present

## 2024-04-06 DIAGNOSIS — L8990 Pressure ulcer of unspecified site, unspecified stage: Secondary | ICD-10-CM | POA: Insufficient documentation

## 2024-04-06 DIAGNOSIS — R531 Weakness: Secondary | ICD-10-CM | POA: Diagnosis not present

## 2024-04-06 DIAGNOSIS — N390 Urinary tract infection, site not specified: Secondary | ICD-10-CM | POA: Diagnosis present

## 2024-04-06 DIAGNOSIS — I5032 Chronic diastolic (congestive) heart failure: Secondary | ICD-10-CM | POA: Diagnosis present

## 2024-04-06 DIAGNOSIS — G2581 Restless legs syndrome: Secondary | ICD-10-CM | POA: Diagnosis present

## 2024-04-06 DIAGNOSIS — T782XXA Anaphylactic shock, unspecified, initial encounter: Secondary | ICD-10-CM | POA: Diagnosis not present

## 2024-04-06 DIAGNOSIS — B9561 Methicillin susceptible Staphylococcus aureus infection as the cause of diseases classified elsewhere: Secondary | ICD-10-CM | POA: Diagnosis present

## 2024-04-06 DIAGNOSIS — I2510 Atherosclerotic heart disease of native coronary artery without angina pectoris: Secondary | ICD-10-CM | POA: Diagnosis present

## 2024-04-06 DIAGNOSIS — K219 Gastro-esophageal reflux disease without esophagitis: Secondary | ICD-10-CM | POA: Diagnosis present

## 2024-04-06 DIAGNOSIS — G4733 Obstructive sleep apnea (adult) (pediatric): Secondary | ICD-10-CM | POA: Diagnosis present

## 2024-04-06 DIAGNOSIS — E785 Hyperlipidemia, unspecified: Secondary | ICD-10-CM | POA: Diagnosis present

## 2024-04-06 DIAGNOSIS — R2241 Localized swelling, mass and lump, right lower limb: Secondary | ICD-10-CM | POA: Diagnosis present

## 2024-04-06 DIAGNOSIS — R55 Syncope and collapse: Secondary | ICD-10-CM | POA: Diagnosis not present

## 2024-04-06 DIAGNOSIS — E860 Dehydration: Secondary | ICD-10-CM | POA: Diagnosis present

## 2024-04-06 DIAGNOSIS — Z743 Need for continuous supervision: Secondary | ICD-10-CM | POA: Diagnosis not present

## 2024-04-06 DIAGNOSIS — N400 Enlarged prostate without lower urinary tract symptoms: Secondary | ICD-10-CM | POA: Diagnosis present

## 2024-04-06 DIAGNOSIS — R404 Transient alteration of awareness: Secondary | ICD-10-CM | POA: Diagnosis not present

## 2024-04-06 DIAGNOSIS — R7881 Bacteremia: Secondary | ICD-10-CM | POA: Diagnosis not present

## 2024-04-06 DIAGNOSIS — B9620 Unspecified Escherichia coli [E. coli] as the cause of diseases classified elsewhere: Secondary | ICD-10-CM | POA: Diagnosis present

## 2024-04-06 DIAGNOSIS — I110 Hypertensive heart disease with heart failure: Secondary | ICD-10-CM | POA: Diagnosis present

## 2024-04-06 DIAGNOSIS — R5383 Other fatigue: Secondary | ICD-10-CM | POA: Diagnosis not present

## 2024-04-06 DIAGNOSIS — G40909 Epilepsy, unspecified, not intractable, without status epilepticus: Secondary | ICD-10-CM | POA: Diagnosis present

## 2024-04-06 LAB — MAGNESIUM: Magnesium: 1.7 mg/dL (ref 1.7–2.4)

## 2024-04-06 LAB — BASIC METABOLIC PANEL WITH GFR
Anion gap: 8 (ref 5–15)
BUN: 10 mg/dL (ref 8–23)
CO2: 25 mmol/L (ref 22–32)
Calcium: 8.2 mg/dL — ABNORMAL LOW (ref 8.9–10.3)
Chloride: 110 mmol/L (ref 98–111)
Creatinine, Ser: 0.73 mg/dL (ref 0.61–1.24)
GFR, Estimated: >60 mL/min (ref >60)
Glucose, Bld: 99 mg/dL (ref 70–99)
Potassium: 3.2 mmol/L — ABNORMAL LOW (ref 3.5–5.1)
Sodium: 143 mmol/L (ref 135–145)

## 2024-04-06 LAB — GLUCOSE, CAPILLARY: Glucose-Capillary: 123 mg/dL — ABNORMAL HIGH (ref 70–99)

## 2024-04-06 LAB — BLOOD CULTURE ID PANEL (REFLEXED) - BCID2
A.calcoaceticus-baumannii: NOT DETECTED
Bacteroides fragilis: NOT DETECTED
Candida albicans: NOT DETECTED
Candida auris: NOT DETECTED
Candida glabrata: NOT DETECTED
Candida krusei: NOT DETECTED
Candida parapsilosis: NOT DETECTED
Candida tropicalis: NOT DETECTED
Cryptococcus neoformans/gattii: NOT DETECTED
Enterobacter cloacae complex: NOT DETECTED
Enterococcus Faecium: NOT DETECTED
Enterococcus faecalis: NOT DETECTED
Escherichia coli: NOT DETECTED
Haemophilus influenzae: NOT DETECTED
Klebsiella aerogenes: NOT DETECTED
Klebsiella oxytoca: NOT DETECTED
Klebsiella pneumoniae: NOT DETECTED
Listeria monocytogenes: NOT DETECTED
Neisseria meningitidis: NOT DETECTED
Proteus species: NOT DETECTED
Pseudomonas aeruginosa: NOT DETECTED
Salmonella species: NOT DETECTED
Serratia marcescens: NOT DETECTED
Staphylococcus aureus (BCID): DETECTED — AB
Stenotrophomonas maltophilia: NOT DETECTED
Streptococcus agalactiae: NOT DETECTED
Streptococcus pneumoniae: NOT DETECTED
Streptococcus pyogenes: NOT DETECTED

## 2024-04-06 LAB — CBC
HCT: 37.6 % — ABNORMAL LOW (ref 39.0–52.0)
Hemoglobin: 12.8 g/dL — ABNORMAL LOW (ref 13.0–17.0)
MCH: 33 pg (ref 26.0–34.0)
MCHC: 34 g/dL (ref 30.0–36.0)
MCV: 96.9 fL (ref 80.0–100.0)
Platelets: 220 10*3/uL (ref 150–400)
RBC: 3.88 MIL/uL — ABNORMAL LOW (ref 4.22–5.81)
RDW: 12.5 % (ref 11.5–15.5)
WBC: 7.4 10*3/uL (ref 4.0–10.5)
nRBC: 0 % (ref 0.0–0.2)

## 2024-04-06 MED ORDER — POTASSIUM CHLORIDE CRYS ER 20 MEQ PO TBCR
40.0000 meq | EXTENDED_RELEASE_TABLET | Freq: Once | ORAL | Status: AC
  Administered 2024-04-06: 40 meq via ORAL
  Filled 2024-04-06: qty 2

## 2024-04-06 MED ORDER — CHOLESTYRAMINE 4 G PO PACK
4.0000 g | PACK | Freq: Every day | ORAL | Status: DC
  Administered 2024-04-06 – 2024-04-09 (×4): 4 g via ORAL
  Filled 2024-04-06 (×5): qty 1

## 2024-04-06 NOTE — Plan of Care (Signed)

## 2024-04-06 NOTE — Evaluation (Signed)
 Physical Therapy Evaluation Patient Details Name: David Atkins MRN: [id number] DOB: [birth year] Today's Date: 04/06/2024  History of Present Illness  Pt is a 89 y.o. male presents to ED via EMS from home after a near syncopal episode, hypotensive. Admitted with MSSA bacteremia 2/2 unclear source. PMH of  dCHF, hypertension, hyperlipidemia, COPD, CAD s/p CABG, GERD, BPH, BPPV, CVA found incidentally on imaging no symptoms, nonepileptic seizure (not taking medications), GAD, IBS, and OSA on CPAP.  Clinical Impression  Pt received in chair with daughter by his side who is also his primary care provider and lives with pt and wife. Pt agreeable ot PT interventions. Pt and O x 4, pleasant and motivated. PLOF is Ind with household level activities using Rolator walker. Simple cooking and Ind with ADLs. Pt Home is handicapped accessible. Pt has all DMEs. PT assessment revealed pt needs CGA  for ambulation with FWW and Sup for STS transfers. L side weakness then R side. Proximal weakness noted. Pt has edema in B ankle and feet. Pt has mild balance impairments. PT will continue in acute and will benefit  from continued PT beyond Acute confirmed by [facility] score.       If plan is discharge home, recommend the following: A little help with walking and/or transfers;A little help with bathing/dressing/bathroom;Assistance with cooking/housework   Can travel by private vehicle        Equipment Recommendations None recommended by PT;Other (comment) (pt has all DMEs)  Recommendations for Other Services       Functional Status Assessment Patient has had a recent decline in their functional status and demonstrates the ability to make significant improvements in function in a reasonable and predictable amount of time.     Precautions / Restrictions Precautions Precautions: Fall Restrictions Weight Bearing Restrictions Per Provider Order: No      Mobility  Bed Mobility               General bed  mobility comments: recevied in Chair and returned to chair    Transfers Overall transfer level: Needs assistance Equipment used: Rolling walker (2 wheels) Transfers: Sit to/from Stand Sit to Stand: Supervision                Ambulation/Gait Ambulation/Gait assistance: Contact guard assist Gait Distance (Feet): 120 Feet Assistive device: Rolling walker (2 wheels) Gait Pattern/deviations: Step-through pattern Gait velocity: good     General Gait Details: mild unsteadiness with FWW noted without LOB, C/S in major flexion and kyphosis.  Stairs            Wheelchair Mobility     Tilt Bed    Modified Rankin (Stroke Patients Only)       Balance Overall balance assessment: Needs assistance Sitting-balance support: Feet supported, Single extremity supported Sitting balance-Leahy Scale: Good     Standing balance support: Bilateral upper extremity supported, Reliant on assistive device for balance Standing balance-Leahy Scale: Good Standing balance comment: with FWW withotu LOB. Mild sways with ambulation.                             Pertinent Vitals/Pain Pain Assessment Pain Assessment: No/denies pain    Home Living Family/patient expects to be discharged to:: Private residence Living Arrangements: Spouse/significant other;Children Available Help at Discharge: Family;Available 24 hours/day Type of Home: House Home Access: Level entry       Home Layout: One level Home Equipment: Rollator (4 wheels);Shower seat - built in;Grab bars -  toilet;Grab bars - tub/shower;Hospital bed      Prior Function Prior Level of Function : Independent/Modified Independent;History of Falls (last six months)             Mobility Comments: rollator for household distances and out to mailbox; 2 falls in the last few months ADLs Comments: mostly IND, wife and daughter can assist; cleaning lady 1x/month and additional care aide who manages meal, laundry, etc.      Extremity/Trunk Assessment   Upper Extremity Assessment Upper Extremity Assessment: Defer to OT evaluation    Lower Extremity Assessment Lower Extremity Assessment: Generalized weakness    Cervical / Trunk Assessment Cervical / Trunk Assessment: Kyphotic  Communication   Communication Communication: No apparent difficulties Factors Affecting Communication: Hearing impaired    Cognition Arousal: Alert Behavior During Therapy: WFL for tasks assessed/performed                             Following commands: Intact       Cueing Cueing Techniques: Verbal cues     General Comments General comments (skin integrity, edema, etc.): edema in B feet and ankle noted    Exercises General Exercises - Lower Extremity Ankle Circles/Pumps: AROM, Both, 10 reps, Seated Heel Slides: AROM, Both, 10 reps, Seated Straight Leg Raises: AROM, Both, 10 reps, Seated Other Exercises Other Exercises: bridges 1 x 10 reps Other Exercises: Same exs provided in writing on white board   Assessment/Plan    PT Assessment Patient needs continued PT services  PT Problem List         PT Treatment Interventions      PT Goals (Current goals can be found in the Care Plan section)  Acute Rehab PT Goals Patient Stated Goal: " Get better and go home." PT Goal Formulation: With patient Time For Goal Achievement: 04/20/24 Potential to Achieve Goals: Good    Frequency       Co-evaluation               AM-PAC PT "6 Clicks" Mobility  Outcome Measure Help needed turning from your back to your side while in a flat bed without using bedrails?: A Little Help needed moving from lying on your back to sitting on the side of a flat bed without using bedrails?: A Little Help needed moving to and from a bed to a chair (including a wheelchair)?: A Little Help needed standing up from a chair using your arms (e.g., wheelchair or bedside chair)?: A Little Help needed to walk in hospital  room?: A Little Help needed climbing 3-5 steps with a railing? : A Little 6 Click Score: 18    End of Session Equipment Utilized During Treatment: Gait belt            Time: 6045-4098 PT Time Calculation (min) (ACUTE ONLY): 41 min   Charges:   PT Evaluation $PT Eval Low Complexity: 1 Low PT Treatments $Gait Training: 8-22 mins $Therapeutic Exercise: 8-22 mins PT General Charges $$ ACUTE PT VISIT: 1 Visit       Dary Dilauro PT DPT 2:00 PM,04/06/24

## 2024-04-06 NOTE — Progress Notes (Addendum)
 Progress Note   Patient: David Atkins ZOX:[id number] DOB: 1929-05-28 DOA: 04/05/2024     0 DOS: the patient was seen and examined on 04/06/2024   Brief hospital course: HPI on admission: David Atkins is a 89 y.o. male with medical history significant of  dCHF, hypertension, hyperlipidemia, COPD, CAD s/p CABG, GERD, BPH, BPPV, CVA found incidentally on imaging no symptoms, nonepileptic seizure (not taking medications), GAD, IBS, and OSA on CPAP.  He presents to [facility] ED via EMS from home after a near syncopal episode. Daughter reports in the past that he has had similar episodes of dizziness and incontinence of stool however last night he was also noted to be hypotensive (115/97-->95/59) which prompted them to call EMS. He remained alert and did not have loss of consciousness. He denies fevers, chills, headache, vision changes, chest pain, palpitations, dyspnea, abdominal pain, nausea, vomiting. He also denies dysuria but does endorse difficulty starting stream    Patient was found to a UTI during ED evaluation, was admitted and started on empiric IV antibiotics.  Blood cultures subsequently positive for MSSA.  Infectious disease consulted.    Further hospital course and management as outlined below.  5/4 - urine culture +gram negative rods, reincubated for better growth    Assessment and Plan:  Near Syncope - occurred in setting of low BP at home.  History of similar episodes intermittently, daughter will push electrolyte fluids and typically he improves quickly. Troponin: 7-->7 CT Head: NEGATIVE --Daily Orthostatic vitals --Allow some elevated BP so orthostatic drops don't cause symptoms --s/p IV hydration, now monitoring off fluids --Echo pending - also for evaluation of bacteremia --Telemetry --Neuro checks --PT/OT eval and treat   Urinary Tract Infection 5/4 - Urine cx +GNR's --Continue Ancef  --Follow urine culture   MSSA Bacteremia -- unclear source at this  time. UTI is gram negative.  ?Skin possibly, recent biopsy left upper back / shoulder.   --On Ancef , de-escalated from Rocephin --ID is consulted --2D Echo pending --Likely to need TEE --Repeat blood cultures tomorrow   Pruritus - possibly related to bile acids, no associated rash. Pt and family describe severe diffuse itching of his upper body Post-cholecystectomy diarrhea --Start trial of cholestyramine , 4 mg daily, titrate dose if well tolerated This may help both itching and diarrhea.  Hypokalemia- K 3.2 --Replacing with PO Kcl --Monitor BMP, replace K PRN  Chronic diastolic CHF - compensated - Daily weights - Strict I/Os - Repeat ECHO as above   Hypertension - Continue home amlodipine , lisinopril , and metoprolol    CAD - Continue home lipitor   BPH - Continue home proscar  and Silodosin formulary alternative   OSA - Continue home qHS CPAP      Subjective: Pt seen with daughter at bedside, pt up in recliner today.  He reports feeling better today.  He and daughter report pt has severe diffuse itching of entire upper body for several months. Also has severe diarrhea since gallbladder surgery 13 months ago.  Daughter reports intermittent low BP's like before episode that brought him here yesterday, but typically he improves promptly after drinking electrolyte fluids.  Pt denies fever chills, N/V, cough, any mouth or dental problems.  He had a recent skin biopsy on his left upper back.  He also has a new skin lesion suspicious for recurrent skin cancer on his right wrist/forearm.   They deny him ever having a rash with the itching.   Physical Exam: Vitals:   04/05/24 1741 04/05/24 2004 04/06/24 0305 04/06/24 0500  BP: (!) 149/93 (!) 158/91 (!) 158/85   Pulse: 80 71 69   Resp: 18 12 18    Temp: 97.6 F (36.4 C) 97.9 F (36.6 C) 98 F (36.7 C)   TempSrc: Oral     SpO2: 91% 98% 95%   Weight:    77.6 kg  Height:       General exam: awake, alert, no acute  distress HEENT: wearing glasses, moist mucus membranes, hearing grossly normal  Respiratory system: CTAB, no wheezes, rales or rhonchi, normal respiratory effort. Cardiovascular system: normal S1/S2, RRR.   Gastrointestinal system: soft, NT, ND, no HSM felt, +bowel sounds. Central nervous system: A&O x 3. no gross focal neurologic deficits, normal speech Extremities: moves all, b/l ankle edema, normal tone Skin: dry, intact, normal temperature, images below of skin biopsy site and right wrist lesion Psychiatry: normal mood, congruent affect, judgement and insight appear normal      Data Reviewed:  Notable labs --  K 3.2, Ca 8.2 otherwise normal BMP Hbg 12.8 stable No leukocytosis  Micro: Urine culture -- GNR's  Blood cultures -- staph - ID and susceptibility pending, no resistance per ID   Family Communication: daughter at bedside on rounds  Disposition: Status is: Inpatient Remains inpatient appropriate because: on IV antibiotics and ongoing evaluation of bacteremia   Planned Discharge Destination: Home with Home Health    Time spent: 45 minutes  Author: Montey Apa, DO 04/06/2024 7:53 AM  For on call review [email or web address].

## 2024-04-06 NOTE — Evaluation (Signed)
 Occupational Therapy Evaluation Patient Details Name: David Atkins MRN: [id number] DOB: 02-Apr-1929 Today's Date: 04/06/2024   History of Present Illness   Pt is a 89 y.o. male presents to ED via EMS from home after a near syncopal episode, hypotensive. Admitted with MSSA bacteremia 2/2 unclear source. PMH of  dCHF, hypertension, hyperlipidemia, COPD, CAD s/p CABG, GERD, BPH, BPPV, CVA found incidentally on imaging no symptoms, nonepileptic seizure (not taking medications), GAD, IBS, and OSA on CPAP.     Clinical Impressions Pt was seen for OT evaluation this date. PTA, pt lives at home with his wife and daughter in a one level with level entry handicapped accessible home. Reports MOD/IND with ADLs and mobility using rollator. Able to walk to the mailbox, did some indoor wood working. Has a lady come in to clean, cook meals, etc. Admits 2 falls in the last 6 months.  Pt presents to acute OT demonstrating impaired ADL performance and functional mobility 2/2 weakness and low activity tolerance. Pt currently requires SUP for bed mobility, CGA for STS and CGA for ~25 feet of mobility using RW with no LOB. Pt reports mild LLE weakness. Able to self feed breakfast seated in recliner at end of session. Orthostatic vitals were negative during session. Increased time to perform tasks as he is HOH, fully alert and oriented and follows commands well. Anticipate Min A for LB ADLs. Pt with baseline tremors. Daughter present in room and reports he will be okay to return home once medically stable.  Pt would benefit from skilled OT services to address noted impairments and functional limitations to maximize safety and independence while minimizing falls risk and caregiver burden. Do anticipate the need for follow up OT services upon acute hospital DC.       If plan is discharge home, recommend the following:   A little help with walking and/or transfers;A little help with bathing/dressing/bathroom      Functional Status Assessment   Patient has had a recent decline in their functional status and demonstrates the ability to make significant improvements in function in a reasonable and predictable amount of time.     Equipment Recommendations   None recommended by OT     Recommendations for Other Services         Precautions/Restrictions         Mobility Bed Mobility Overal bed mobility: Needs Assistance Bed Mobility: Supine to Sit     Supine to sit: HOB elevated, Supervision, Used rails          Transfers Overall transfer level: Needs assistance Equipment used: Rolling walker (2 wheels) Transfers: Sit to/from Stand Sit to Stand: From elevated surface, Contact guard assist           General transfer comment: has hospital bed at home and lift chairs, etc.; cueing for hand placement for safety      Balance Overall balance assessment: Needs assistance Sitting-balance support: Feet supported, Single extremity supported Sitting balance-Leahy Scale: Good     Standing balance support: Bilateral upper extremity supported, Reliant on assistive device for balance Standing balance-Leahy Scale: Fair Standing balance comment: RW, CGA no LOB                           ADL either performed or assessed with clinical judgement   ADL Overall ADL's : Needs assistance/impaired  Toilet Transfer: Rolling walker (2 wheels);Contact guard assist;Supervision/safety Toilet Transfer Details (indicate cue type and reason): CGA/SBA for transfer to recliner         Functional mobility during ADLs: Contact guard assist;Rolling walker (2 wheels)       Vision         Perception         Praxis         Pertinent Vitals/Pain Pain Assessment Pain Assessment: No/denies pain     Extremity/Trunk Assessment Upper Extremity Assessment Upper Extremity Assessment: Overall WFL for tasks assessed (tremors at baseline)    Lower Extremity Assessment Lower Extremity Assessment: Generalized weakness   Cervical / Trunk Assessment Cervical / Trunk Assessment: Kyphotic   Communication Communication Communication: Impaired Factors Affecting Communication: Hearing impaired   Cognition Arousal: Alert Behavior During Therapy: WFL for tasks assessed/performed Cognition: No apparent impairments                               Following commands: Intact       Cueing  General Comments   Cueing Techniques: Verbal cues      Exercises Other Exercises Other Exercises: Edu on role of OT in acute setting.   Shoulder Instructions      Home Living Family/patient expects to be discharged to:: Private residence Living Arrangements: Spouse/significant other;Children Available Help at Discharge: Family;Available 24 hours/day Type of Home: House Home Access: Level entry     Home Layout: One level     Bathroom Shower/Tub: Producer, television/film/video: Handicapped height Bathroom Accessibility: Yes   Home Equipment: Rollator (4 wheels);Shower seat - built in;Grab bars - toilet;Grab bars - tub/shower;Hospital bed          Prior Functioning/Environment Prior Level of Function : Independent/Modified Independent;History of Falls (last six months)             Mobility Comments: rollator for household distances and out to mailbox; 2 falls in the last few months ADLs Comments: mostly IND, wife and daughter can assist; cleaning lady 1x/month and additional care aide who manages meal, laundry, etc.    OT Problem List: Decreased strength;Decreased activity tolerance   OT Treatment/Interventions: Self-care/ADL training;Balance training;Therapeutic exercise;Therapeutic activities;Energy conservation;Patient/family education      OT Goals(Current goals can be found in the care plan section)   Acute Rehab OT Goals Patient Stated Goal: return home OT Goal Formulation: With  patient/family Time For Goal Achievement: 04/20/24 Potential to Achieve Goals: Good ADL Goals Pt Will Perform Lower Body Bathing: with supervision;sitting/lateral leans;sit to/from stand Pt Will Perform Lower Body Dressing: with supervision;sit to/from stand;sitting/lateral leans Pt Will Transfer to Toilet: with supervision;ambulating;regular height toilet;bedside commode;grab bars   OT Frequency:  Min 1X/week    Co-evaluation              AM-PAC OT "6 Clicks" Daily Activity     Outcome Measure Help from another person eating meals?: None Help from another person taking care of personal grooming?: None Help from another person toileting, which includes using toliet, bedpan, or urinal?: A Little Help from another person bathing (including washing, rinsing, drying)?: A Little Help from another person to put on and taking off regular upper body clothing?: None Help from another person to put on and taking off regular lower body clothing?: A Little 6 Click Score: 21   End of Session Equipment Utilized During Treatment: Gait belt;Rolling walker (2 wheels) Nurse Communication: Mobility status  Activity Tolerance: Patient tolerated treatment well Patient left: in chair;with call bell/phone within reach;with chair alarm set;with family/visitor present;with nursing/sitter in room  OT Visit Diagnosis: Other abnormalities of gait and mobility (R26.89);Muscle weakness (generalized) (M62.81)                Time: 4098-1191 OT Time Calculation (min): 35 min Charges:  OT General Charges $OT Visit: 1 Visit OT Evaluation $OT Eval Moderate Complexity: 1 Mod OT Treatments $Therapeutic Activity: 8-22 mins Jahkari Maclin, OTR/L 04/06/24, 10:53 AM  Leonard Raker 04/06/2024, 10:49 AM

## 2024-04-06 NOTE — TOC Initial Note (Signed)
 Transition of Care [facility]) - Initial/Assessment Note    Patient Details  Name: David Atkins MRN: [id number] Date of Birth: September 01, 1929  Transition of Care [facility]) CM/SW Contact:    Alexandra Ice, RN Phone Number: 04/06/2024, 9:49 AM  Clinical Narrative:                  Transition of Care [facility]) - Inpatient Brief Assessment   Patient Details  Name: David Atkins MRN: [id number] Date of Birth: 1929-06-06  Transition of Care [facility]) CM/SW Contact:    Alexandra Ice, RN Phone Number: 04/06/2024, 9:49 AM   Clinical Narrative: Patient lives with family. Has PCP. ID consulted, will see Monday. Repeat blood cultures pending. TTE and ECHO pending. TOC will follow for potential discharge needs.   Transition of Care Asessment: Insurance and Status: Insurance coverage has been reviewed Patient has primary care physician: Yes Home environment has been reviewed: lives with family   Prior/Current Home Services: No current home services Social Drivers of Health Review: SDOH reviewed no interventions necessary Readmission risk has been reviewed: Yes Transition of care needs: no transition of care needs at this time        Patient Goals and CMS Choice            Expected Discharge Plan and Services                                              Prior Living Arrangements/Services                       Activities of Daily Living      Permission Sought/Granted                  Emotional Assessment              Admission diagnosis:  Lower urinary tract infectious disease [N39.0] Near syncope [R55] Altered mental status, unspecified altered mental status type [R41.82] MSSA bacteremia [R78.81, B95.61] Patient Active Problem List   Diagnosis Date Noted   MSSA bacteremia 04/06/2024   Pressure injury of skin 04/06/2024   Near syncope 04/05/2024   Urinary tract infection 04/05/2024   Post-cholecystectomy syndrome 09/25/2023   Do not  resuscitate discussion 09/25/2023   Do not resuscitate 09/25/2023   Rib pain on right side 06/04/2023   Syncope and collapse 06/04/2023   Ventral hernia without obstruction or gangrene 03/13/2023   Generalized weakness 01/02/2023   CAP (community acquired pneumonia) 12/27/2022   CAD (coronary artery disease) 12/27/2022   Chronic diastolic CHF (congestive heart failure) (HCC) 12/27/2022   Hypokalemia 12/27/2022   Atherosclerosis of aortic bifurcation and common iliac arteries (HCC) 12/09/2022   Cholelithiasis 12/07/2022   Full incontinence of feces 10/24/2022   Laceration of left hand without foreign body 09/27/2022   OAB (overactive bladder)    Allergic rhinitis 03/29/2020   Allergic conjunctivitis 03/29/2020   Pharyngoesophageal dysphagia 07/07/2019   Hospital discharge follow-up 12/29/2018   PAD (peripheral artery disease) (HCC) 10/01/2018   Cerebrovascular disease 02/07/2018   CAD (coronary artery disease), native coronary artery 11/04/2017   Vertigo 10/11/2017   Bilateral leg edema 07/18/2017   RLS (restless legs syndrome) 07/18/2017   Hx of CABG 09/21/2016   Poor balance 12/04/2015   IBS (irritable bowel syndrome) 11/03/2015   Generalized anxiety disorder 01/20/2015   Flatulence, eructation and gas  pain 11/28/2014   Overweight 05/24/2014   Vitamin D  deficiency 05/22/2014   OSA on CPAP 01/19/2014   Lumbar pain with radiation down left leg 12/01/2013   Encounter for preventive health examination 01/13/2013   GERD (gastroesophageal reflux disease) 01/06/2013   Bilateral carotid artery stenosis 12/07/2012   Hyperlipidemia LDL goal <70 07/09/2012   Impaired glucose tolerance 07/09/2012   BPPV (benign paroxysmal positional vertigo) 07/09/2012   Osteoporosis    BPH (benign prostatic hyperplasia)    Hypertension    PCP:  Thersia Flax, MD Pharmacy:   [facility]) - [place], [place] - [address] [facility] [address] [place] [address] [place]  [place] [address] Phone: [phone] Fax: [phone]  [id number] [place], [place] - [address] [place] [address] Phone: [phone] Fax: [phone]  [facility] - [place], [place] - [address] [place] [address] Phone: [phone] Fax: [phone]  [facility] - [place], [place] - [address] [place] [address] Phone: [phone] Fax: [phone]     Social Drivers of Health (SDOH) Social History: SDOH Screenings   Food Insecurity: No Food Insecurity (04/05/2024)  Housing: Unknown (04/05/2024)  Transportation Needs: No Transportation Needs (04/05/2024)  Utilities: Not At Risk (04/05/2024)  Alcohol Screen: Low Risk  (07/24/2023)  Depression (PHQ2-9): Medium Risk (10/26/2023)  Financial Resource Strain: Low Risk  (07/24/2023)  Physical Activity: Inactive (07/24/2023)  Social Connections: Moderately Isolated (04/05/2024)  Stress: No Stress Concern Present (07/24/2023)  Tobacco Use: Medium Risk (04/05/2024)   SDOH Interventions:     Readmission Risk Interventions     No data to display

## 2024-04-07 ENCOUNTER — Ambulatory Visit: Admitting: Podiatry

## 2024-04-07 ENCOUNTER — Inpatient Hospital Stay
Admit: 2024-04-07 | Discharge: 2024-04-07 | Disposition: A | Discharge status: Home / Self Care | Attending: Internal Medicine | Admitting: Internal Medicine

## 2024-04-07 DIAGNOSIS — R7881 Bacteremia: Secondary | ICD-10-CM | POA: Diagnosis not present

## 2024-04-07 DIAGNOSIS — L858 Other specified epidermal thickening: Secondary | ICD-10-CM

## 2024-04-07 DIAGNOSIS — B9561 Methicillin susceptible Staphylococcus aureus infection as the cause of diseases classified elsewhere: Secondary | ICD-10-CM | POA: Diagnosis not present

## 2024-04-07 DIAGNOSIS — R55 Syncope and collapse: Secondary | ICD-10-CM | POA: Diagnosis not present

## 2024-04-07 LAB — BASIC METABOLIC PANEL WITH GFR
Anion gap: 9 (ref 5–15)
BUN: 8 mg/dL (ref 8–23)
CO2: 25 mmol/L (ref 22–32)
Calcium: 8 mg/dL — ABNORMAL LOW (ref 8.9–10.3)
Chloride: 108 mmol/L (ref 98–111)
Creatinine, Ser: 0.65 mg/dL (ref 0.61–1.24)
GFR, Estimated: >60 mL/min (ref >60)
Glucose, Bld: 87 mg/dL (ref 70–99)
Potassium: 3.3 mmol/L — ABNORMAL LOW (ref 3.5–5.1)
Sodium: 142 mmol/L (ref 135–145)

## 2024-04-07 LAB — ECHOCARDIOGRAM COMPLETE
AR max vel: 1.91 cm2
AV Area VTI: 2 cm2
AV Area mean vel: 1.92 cm2
AV Mean grad: 3.5 mmHg
AV Peak grad: 6.3 mmHg
Ao pk vel: 1.25 m/s
Area-P 1/2: 3.93 cm2
Height: 70 in
MV VTI: 2.45 cm2
S' Lateral: 2.9 cm
Weight: 2737.23 [oz_av]

## 2024-04-07 LAB — URINE CULTURE: Culture: >=100000 — AB

## 2024-04-07 LAB — MAGNESIUM: Magnesium: 1.9 mg/dL (ref 1.7–2.4)

## 2024-04-07 MED ORDER — OXYBUTYNIN CHLORIDE 5 MG PO TABS
5.0000 mg | ORAL_TABLET | Freq: Two times a day (BID) | ORAL | Status: DC
  Administered 2024-04-07 – 2024-04-10 (×6): 5 mg via ORAL
  Filled 2024-04-07 (×6): qty 1

## 2024-04-07 MED ORDER — POTASSIUM CHLORIDE CRYS ER 20 MEQ PO TBCR
40.0000 meq | EXTENDED_RELEASE_TABLET | Freq: Once | ORAL | Status: AC
  Administered 2024-04-07: 40 meq via ORAL
  Filled 2024-04-07: qty 2

## 2024-04-07 NOTE — Plan of Care (Signed)
 The patient shows no s/s of acute distress.

## 2024-04-07 NOTE — Plan of Care (Signed)

## 2024-04-07 NOTE — Progress Notes (Signed)
*  PRELIMINARY RESULTS* Echocardiogram 2D Echocardiogram has been performed.  David Atkins 04/07/2024, 8:52 AM

## 2024-04-07 NOTE — Progress Notes (Signed)
 Progress Note   Patient: David Atkins FAO:[id number] DOB: 1929/06/12 DOA: 04/05/2024     1 DOS: the patient was seen and examined on 04/07/2024   Brief hospital course: HPI on admission: David Atkins is a 89 y.o. male with medical history significant of  dCHF, hypertension, hyperlipidemia, COPD, CAD s/p CABG, GERD, BPH, BPPV, CVA found incidentally on imaging no symptoms, nonepileptic seizure (not taking medications), GAD, IBS, and OSA on CPAP.  He presents to [facility] ED via EMS from home after a near syncopal episode. Daughter reports in the past that he has had similar episodes of dizziness and incontinence of stool however last night he was also noted to be hypotensive (115/97-->95/59) which prompted them to call EMS. He remained alert and did not have loss of consciousness. He denies fevers, chills, headache, vision changes, chest pain, palpitations, dyspnea, abdominal pain, nausea, vomiting. He also denies dysuria but does endorse difficulty starting stream    Patient was found to a UTI during ED evaluation, was admitted and started on empiric IV antibiotics.  Blood cultures subsequently positive for MSSA.  Infectious disease consulted.    Further hospital course and management as outlined below.  5/4 - urine culture +gram negative rods, reincubated for better growth    Assessment and Plan:  Near Syncope - occurred in setting of low BP at home.  History of similar episodes intermittently, daughter will push electrolyte fluids and typically he improves quickly. Troponin: 7-->7 CT Head: NEGATIVE ECHO: EF 55-60%, mild LVH, no significant valvular disease --Daily Orthostatic vitals --Allow some elevated BP so orthostatic drops don't cause symptoms --s/p IV hydration, now monitoring off fluids --Telemetry --Neuro checks --PT/OT evals   Urinary Tract Infection 5/4 - Urine cx +GNR's, reincubated --Continue Ancef  --Follow urine culture   MSSA Bacteremia -- unclear source at  this time. UTI is gram negative.  ?Skin possibly, recent biopsy left upper back / shoulder.   --On Ancef , de-escalated from Rocephin --ID is consulted --2D Echo - no vegetations seen --Likely to need TEE, will defer to ID on that --Follow repeat blood cultures    Pruritus - possibly related to bile acids, no associated rash. Pt and family describe severe diffuse itching of his upper body Post-cholecystectomy diarrhea --Started trial of cholestyramine , 4 mg daily, may help both itching and diarrhea. --Titrate cholestyramine  dose   Hypokalemia- K 3.3 today, Mg 1.9 --Replacing with PO Kcl --Monitor BMP, replace K PRN  Chronic diastolic CHF - compensated.  Echo this admission EF 55-60% and normal diastolic function, mild LVH - on metoprolol , not on diuretics - Daily weights - Strict I/Os   Hypertension - Continue home amlodipine , lisinopril , and metoprolol    CAD - Continue home lipitor   BPH - Continue home proscar  and Silodosin formulary alternative   OSA - Continue home qHS CPAP      Subjective: Pt see up in recliner on rounds today.  He reports not having very much itching this morning, feels like new medicine might be helping.  He had a loose BM, not diarrhea, just loose.  Reports feeling better since admission.  He gets tearful at the idea of new medicine giving him relief from itching and diarrhea he's had since cholecystectomy.  He recalls that his PCP had maybe tried him on the same medicine, but he stopped it because he didn't think it helped, but remembers now that he was itching again after he stopped.   Physical Exam: Vitals:   04/06/24 1523 04/06/24 1959 04/07/24 0500 04/07/24  0817  BP: 132/74 (!) 158/81  (!) 180/98  Pulse: 66 72  69  Resp: 17   18  Temp: 97.8 F (36.6 C) 98.7 F (37.1 C)  97.6 F (36.4 C)  TempSrc: Oral Axillary  Oral  SpO2: 99% 99%  100%  Weight:   74.9 kg   Height:       General exam: awake, alert, no acute distress HEENT: wearing  glasses, moist mucus membranes, hearing grossly normal  Respiratory system: CTAB, no wheezes, rales or rhonchi, normal respiratory effort. Cardiovascular system: normal S1/S2, RRR.   Gastrointestinal system: soft, NT, ND, no HSM felt, +bowel sounds. Central nervous system: A&O x 3. no gross focal neurologic deficits, normal speech Extremities: moves all, b/l ankle edema, normal tone Skin: dry, intact, normal temperature Psychiatry: normal mood, congruent affect, judgement and insight appear normal      Data Reviewed:  Notable labs --  K 3.3, Ca 8.0 otherwise normal BMP  Last CBC 5/4:  Hbg 12.8 stable No leukocytosis   Micro: Urine culture -- GNR's  Blood cultures -- staph - ID and susceptibility pending, no resistance per ID     Family Communication: daughter at bedside on rounds 5/4. Will attempt to call daughter today.  Disposition: Status is: Inpatient Remains inpatient appropriate because: on IV antibiotics and ongoing evaluation of bacteremia   Planned Discharge Destination: Home with Home Health    Time spent: 42 minutes  Author: Montey Apa, DO 04/07/2024 12:53 PM  For on call review [email or web address].

## 2024-04-07 NOTE — Consult Note (Signed)
 NAME: David Atkins  DOB: 30-Jan-1929  MRN: [id number]  Date/Time: 04/07/2024 11:49 AM  REQUESTING PROVIDER: Dr.Griffith Subjective:  REASON FOR CONSULT: MSSA bacteremia ? David Atkins is a 89 y.o. with a history of CAD status post CABG, BPH, diastolic heart failure, hypertension, hyperlipidemia, OSA on CPAP, IBS, history of seizures but not on any meds CVA found incidentally on imaging presented to [facility] on 5 3 after a near syncopal episode.  As per his daughter that he has had similar episodes of dizziness and incontinence of stool however last night he was noted to be hypotensive with a BP of 95/59.  Which prompted them to call EMS .  He remained alert and did not lose consciousness.. Vitals in the ED BP of 130/72, temperature 98, pulse 72, sats 99%.  Respiratory rate 14.  WBC 10.7, Hb 13.7, platelet 227 and creatinine of 0.99.  Blood culture was sent and an 1 set turn positive in the aerobic bottle for MSSA and I am seeing the patient for the same. CT head shows no evidence of acute intracranial abnormality.  Signs of chronic microvascular disease.  Chest x-ray normal  As per daughter patent has had these episodes where he gets light headed, weak, almost passes out and sometimes this is accompanied by explosive diarrhea which pretty much empties his intestine and there is huge pool of watery diarrhea This started some time in 2022 but has gotten worse and in 2024 he had 5-6 episodes-  He saw neurologist and cardiologist with no specific diagnosis PCP diagnosed him as non seizure , epilepsy  Since this year he has had severe pruritus and has been seeing the dermatologist. They did a biopsy from his back in March and as petr daughter no specific diagnosis    His daughter noted that a new skin lesion started abruptly 2 weeks ago when she was traveling and when she came back she saw a lesion on the rt fore arm ( April 22/2025)    This bled at one point He has an appt with his  dermatologist next week  Past Medical History:  Diagnosis Date   3-vessel coronary artery disease 2006   a.) s/p 3v revascularization (CABG)   Acute cholecystitis    Anxiety    Basal cell carcinoma 07/09/2012   Bilateral carotid artery stenosis    a.) doppler 07/13/2017L < 39% BICA; b.) doppler 11/05/2018: 40-59% RICA, 60-79% LICA; c.) doppler 11/11/2019, 11/16/2020, 11/15/2021: 1-39% RICA; 40-59% LICA; d.) doppler 11/24/2022: 1-39% BICA   BPH (benign prostatic hyperplasia)    BPPV (benign paroxysmal positional vertigo)    Cerebrovascular small vessel disease 01/02/2018   a.) brain MRI 01/02/2018: chronic small vessel ischemic changes within the cerebral white matter, basal ganglia and pons; b.) brain MRI 11/08/2025: progressive small vessel disease since previous MRI   Cholelithiasis    Chronic diastolic (congestive) heart failure (HCC)    a.) TTE 09/27/2022: EF 60-65%, mild MR, G1DD   Concussion w loss of consciousness of unsp duration, subs 12/29/2018   CVA (cerebral vascular accident) [facility])    a.) brain MRI 11/2022: lacunar infarcts within the bilateral centrum semiovale and left corona radiata   First degree heart block    Full incontinence of feces    GERD (gastroesophageal reflux disease)    Hx of CABG    Hyperlipidemia    Hypertension    Hypokalemia    IBS (irritable bowel syndrome)    Lower leg edema    Lumbar stenosis with neurogenic  claudication 12/03/2013   Myocardial infarction [facility]) 2006   a.) Tx'd with 3v revascularization (CABG)   OAB (overactive bladder)    OSA on CPAP    Osteoporosis 2010   by DEXA at [facility]   PAD (peripheral artery disease) (HCC)    Poor balance    PSVT (paroxysmal supraventricular tachycardia) (HCC) 11/02/2022   a.) holter 11/02/2022: SVT x 128 episodes --> fastest 4 beats at a max rate of 179 bpm; longested 17 beats at average rate of 98 bpm   RLS (restless legs syndrome)    a.) on ropinirole    S/P CABG x 3 2006   Seizure (HCC)    Sepsis  (HCC)    Sepsis (HCC) 12/27/2022   Spondylolisthesis of lumbar region 11/23/2013   MRI Lumbar spine   Umbilical hernia     Past Surgical History:  Procedure Laterality Date   APPENDECTOMY  1951   COLONOSCOPY     COLONOSCOPY WITH PROPOFOL  N/A 11/15/2015   Procedure: COLONOSCOPY WITH PROPOFOL ;  Surgeon: Stephens Eis, MD;  Location: [facility] ENDOSCOPY;  Service: Gastroenterology;  Laterality: N/A;   CORONARY ARTERY BYPASS GRAFT  2006   3 vessel, s/p AMI   INTRAOPERATIVE CHOLANGIOGRAM  03/13/2023   Procedure: INTRAOPERATIVE CHOLANGIOGRAM;  Surgeon: Alben Alma, MD;  Location: [facility] ORS;  Service: General;;   IR EXCHANGE BILIARY DRAIN  02/28/2023   IR PERC CHOLECYSTOSTOMY  12/27/2022   LEFT HEART CATH AND CORONARY ANGIOGRAPHY Left 2006   NASAL SEPTOPLASTY W/ TURBINOPLASTY  1981   TONSILLECTOMY     UMBILICAL HERNIA REPAIR N/A 03/13/2023   Procedure: HERNIA REPAIR UMBILICAL ADULT;  Surgeon: Alben Alma, MD;  Location: [facility] ORS;  Service: General;  Laterality: N/A;    Social History   Socioeconomic History   Marital status: Married    Spouse name: Not on file   Number of children: Not on file   Years of education: Not on file   Highest education level: Not on file  Occupational History   Not on file  Tobacco Use   Smoking status: Former    Current packs/day: 0.00    Types: Cigarettes    Quit date: 07/10/1963    Years since quitting: 60.7   Smokeless tobacco: Never  Vaping Use   Vaping status: Never Used  Substance and Sexual Activity   Alcohol use: No   Drug use: No   Sexual activity: Not Currently  Other Topics Concern   Not on file  Social History Narrative   Married   Social Drivers of Health   Financial Resource Strain: Low Risk  (07/24/2023)   Overall Financial Resource Strain (CARDIA)    Difficulty of Paying Living Expenses: Not hard at all  Food Insecurity: No Food Insecurity (04/05/2024)   Hunger Vital Sign    Worried About Running Out of Food in the Last Year: Never  true    Ran Out of Food in the Last Year: Never true  Transportation Needs: No Transportation Needs (04/05/2024)   PRAPARE - Administrator, Civil Service (Medical): No    Lack of Transportation (Non-Medical): No  Physical Activity: Inactive (07/24/2023)   Exercise Vital Sign    Days of Exercise per Week: 0 days    Minutes of Exercise per Session: 0 min  Stress: No Stress Concern Present (07/24/2023)   [employer] of Occupational Health - Occupational Stress Questionnaire    Feeling of Stress : Not at all  Social Connections: Socially Isolated (04/06/2024)  Social Advertising account executive [NHANES]    Frequency of Communication with Friends and Family: Once a week    Frequency of Social Gatherings with Friends and Family: Once a week    Attends Religious Services: Never    Database administrator or Organizations: No    Attends Banker Meetings: Never    Marital Status: Married  Catering manager Violence: Not At Risk (04/05/2024)   Humiliation, Afraid, Rape, and Kick questionnaire    Fear of Current or Ex-Partner: No    Emotionally Abused: No    Physically Abused: No    Sexually Abused: No    Family History  Problem Relation Age of Onset   Hyperlipidemia Mother    Hypertension Mother    Hypertension Father    Allergies  Allergen Reactions   Sulfa Antibiotics Rash   I? Current Facility-Administered Medications  Medication Dose Route Frequency Provider Last Rate Last Admin   acetaminophen  (TYLENOL ) tablet 650 mg  650 mg Oral Q6H PRN Foust, Katy L, NP       Or   acetaminophen  (TYLENOL ) suppository 650 mg  650 mg Rectal Q6H PRN Foust, Katy L, NP       amLODipine  (NORVASC ) tablet 2.5 mg  2.5 mg Oral Daily Foust, Katy L, NP   2.5 mg at 04/07/24 0958   atorvastatin  (LIPITOR) tablet 20 mg  20 mg Oral Daily Foust, Katy L, NP   20 mg at 04/07/24 0947   ceFAZolin  (ANCEF ) IVPB 2g/100 mL premix  2 g Intravenous Q8H Ananias Balls, RPH 200 mL/hr at  04/07/24 0604 2 g at 04/07/24 0604   cholecalciferol  (VITAMIN D3) 25 MCG (1000 UNIT) tablet 1,000 Units  1,000 Units Oral Daily Foust, Katy L, NP   1,000 Units at 04/07/24 1610   cholestyramine  (QUESTRAN ) packet 4 g  4 g Oral Daily Darus Engels A, DO   4 g at 04/06/24 1832   enoxaparin  (LOVENOX ) injection 40 mg  40 mg Subcutaneous Q24H Foust, Katy L, NP   40 mg at 04/06/24 2103   finasteride  (PROSCAR ) tablet 5 mg  5 mg Oral Daily Foust, Katy L, NP   5 mg at 04/07/24 9604   lisinopril  (ZESTRIL ) tablet 20 mg  20 mg Oral Daily Foust, Katy L, NP   20 mg at 04/07/24 0948   melatonin tablet 5 mg  5 mg Oral QHS PRN Foust, Katy L, NP       metoprolol  tartrate (LOPRESSOR ) tablet 12.5 mg  12.5 mg Oral BID Foust, Katy L, NP   12.5 mg at 04/07/24 5409   ondansetron  (ZOFRAN ) tablet 4 mg  4 mg Oral Q6H PRN Foust, Katy L, NP       Or   ondansetron  (ZOFRAN ) injection 4 mg  4 mg Intravenous Q6H PRN Foust, Katy L, NP       pantoprazole  (PROTONIX ) EC tablet 40 mg  40 mg Oral Daily Foust, Katy L, NP   40 mg at 04/07/24 0948   polyethylene glycol (MIRALAX  / GLYCOLAX ) packet 17 g  17 g Oral Daily PRN Foust, Katy L, NP   17 g at 04/06/24 0858     Abtx:  Anti-infectives (From admission, onward)    Start     Dose/Rate Route Frequency Ordered Stop   04/06/24 0200  cefTRIAXone (ROCEPHIN) 2 g in sodium chloride  0.9 % 100 mL IVPB  Status:  Discontinued        2 g 200 mL/hr over 30 Minutes Intravenous Every 24  hours 04/05/24 0758 04/05/24 1951   04/05/24 2200  ceFAZolin  (ANCEF ) IVPB 2g/100 mL premix        2 g 200 mL/hr over 30 Minutes Intravenous Every 8 hours 04/05/24 1956     04/05/24 0230  cefTRIAXone (ROCEPHIN) 1 g in sodium chloride  0.9 % 100 mL IVPB        1 g 200 mL/hr over 30 Minutes Intravenous  Once 04/05/24 0226 04/05/24 1610       REVIEW OF SYSTEMS:  Const: negative fever, + chills, negative weight loss Eyes: negative diplopia or visual changes, negative eye pain ENT: negative coryza, negative  sore throat Resp: negative cough, hemoptysis, dyspnea Cards: negative for chest pain, palpitations, has lower extremity edema GU: has urinary incontinence GI: Negative for abdominal pain, , bleeding, constipation Skin: as above Heme: negative for easy bruising and gum/nose bleeding MS: weakness Neurolo:negative for headaches, dizziness, vertigo, memory problems  Psych: negative for feelings of anxiety, depression  Endocrine: negative for thyroid , diabetes Allergy /Immunology-sulfa Objective:  VITALS:  BP (!) 180/98 (BP Location: Left Arm)   Pulse 69   Temp 97.6 F (36.4 C) (Oral)   Resp 18   Ht 5\' 10"  (1.778 m)   Wt 74.9 kg   SpO2 100%   BMI 23.69 kg/m   PHYSICAL EXAM:  General: Alert, cooperative, no distress, appears stated age. Oriented in person , place and able to answer questions appropriately Head: Normocephalic, without obvious abnormality, atraumatic. Eyes: Conjunctivae clear, anicteric sclerae. Pupils are equal ENT Nares normal. No drainage or sinus tenderness. Lips, mucosa, and tongue normal. No Thrush Neck: Supple, symmetrical, no adenopathy, thyroid : non tender no carotid bruit and no JVD. Back: No CVA tenderness. Lungs: Clear to auscultation bilaterally. No Wheezing or Rhonchi. No rales. Heart: Regular rate and rhythm, no murmur, rub or gallop. Abdomen: Soft, non-tender,not distended. Bowel sounds normal. No masses Extremities: atraumatic, no cyanosis. No edema. No clubbing Skin: rt forearm- a keratinized nodule     B/l feet has some scaliness and pigmentation   Lymph: Cervical, supraclavicular normal. Neurologic: Grossly non-focal Pertinent Labs Lab Results CBC    Component Value Date/Time   WBC 7.4 04/06/2024 0313   RBC 3.88 (L) 04/06/2024 0313   HGB 12.8 (L) 04/06/2024 0313   HGB 15.2 01/06/2014 1655   HCT 37.6 (L) 04/06/2024 0313   HCT 44.4 01/06/2014 1655   PLT 220 04/06/2024 0313   PLT 174 01/06/2014 1655   MCV 96.9 04/06/2024 0313   MCV  94 01/06/2014 1655   MCH 33.0 04/06/2024 0313   MCHC 34.0 04/06/2024 0313   RDW 12.5 04/06/2024 0313   RDW 13.3 01/06/2014 1655   LYMPHSABS 1.3 04/05/2024 0126   LYMPHSABS 1.5 12/01/2012 1106   MONOABS 0.4 04/05/2024 0126   MONOABS 0.6 12/01/2012 1106   EOSABS 0.1 04/05/2024 0126   EOSABS 0.2 12/01/2012 1106   BASOSABS 0.0 04/05/2024 0126   BASOSABS 0.0 12/01/2012 1106       Latest Ref Rng & Units 04/07/2024    4:28 AM 04/06/2024    3:13 AM 04/05/2024    1:26 AM  CMP  Glucose 70 - 99 mg/dL 87  99  960   BUN 8 - 23 mg/dL 8  10  18    Creatinine 0.61 - 1.24 mg/dL 4.54  0.98  1.19   Sodium 135 - 145 mmol/L 142  143  141   Potassium 3.5 - 5.1 mmol/L 3.3  3.2  4.1   Chloride 98 - 111 mmol/L 108  110  106   CO2 22 - 32 mmol/L 25  25  27    Calcium  8.9 - 10.3 mg/dL 8.0  8.2  8.9   Total Protein 6.5 - 8.1 g/dL   6.6   Total Bilirubin 0.0 - 1.2 mg/dL   0.8   Alkaline Phos 38 - 126 U/L   77   AST 15 - 41 U/L   25   ALT 0 - 44 U/L   22       Microbiology: Recent Results (from the past 240 hours)  Culture, blood (routine x 2)     Status: Abnormal (Preliminary result)   Collection Time: 04/05/24  1:26 AM   Specimen: BLOOD  Result Value Ref Range Status   Specimen Description   Final    BLOOD BLOOD LEFT ARM Performed at [facility], [address], [place], [place] [address]    Special Requests   Final    BOTTLES DRAWN AEROBIC AND ANAEROBIC Blood Culture adequate volume Performed at [facility], [address] [place], [place] [address]    Culture  Setup Time   Final    Organism ID to follow GRAM POSITIVE COCCI AEROBIC BOTTLE ONLY CRITICAL RESULT CALLED TO, READ BACK BY AND VERIFIED WITH: RAQUEL RODRIGUEZ @ 1738 04/05/24 [facility]    Culture (A)  Final    STAPHYLOCOCCUS AUREUS SUSCEPTIBILITIES TO FOLLOW Performed at [facility] [place], [address], [place], [place] [address]    Report Status PENDING  Incomplete  Culture, blood (routine x 2)     Status:  None (Preliminary result)   Collection Time: 04/05/24  1:26 AM   Specimen: BLOOD  Result Value Ref Range Status   Specimen Description BLOOD BLOOD RIGHT ARM  Final   Special Requests   Final    BOTTLES DRAWN AEROBIC AND ANAEROBIC Blood Culture adequate volume   Culture   Final    NO GROWTH 2 DAYS Performed at [facility], [address] [place], [place] [address]    Report Status PENDING  Incomplete  Resp panel by RT-PCR (RSV, Flu A&B, Covid) Anterior Nasal Swab     Status: None   Collection Time: 04/05/24  1:26 AM   Specimen: Anterior Nasal Swab  Result Value Ref Range Status   SARS Coronavirus 2 by RT PCR NEGATIVE NEGATIVE Final    Comment: (NOTE) SARS-CoV-2 target nucleic acids are NOT DETECTED.  The SARS-CoV-2 RNA is generally detectable in upper respiratory specimens during the acute phase of infection. The lowest concentration of SARS-CoV-2 viral copies this assay can detect is 138 copies/mL. A negative result does not preclude SARS-Cov-2 infection and should not be used as the sole basis for treatment or other patient management decisions. A negative result may occur with  improper specimen collection/handling, submission of specimen other than nasopharyngeal swab, presence of viral mutation(s) within the areas targeted by this assay, and inadequate number of viral copies(<138 copies/mL). A negative result must be combined with clinical observations, patient history, and epidemiological information. The expected result is Negative.  Fact Sheet for Patients:  BloggerCourse.com  Fact Sheet for Healthcare Providers:  SeriousBroker.it  This test is no t yet approved or cleared by the [place]  FDA and  has been authorized for detection and/or diagnosis of SARS-CoV-2 by FDA under an Emergency Use Authorization (EUA). This EUA will remain  in effect (meaning this test can be used) for the duration of  the COVID-19 declaration under Section 564(b)(1) of the Act,  21 U.S.C.section 360bbb-3(b)(1), unless the authorization is terminated  or revoked sooner.       Influenza A by PCR NEGATIVE NEGATIVE Final   Influenza B by PCR NEGATIVE NEGATIVE Final    Comment: (NOTE) The Xpert Xpress SARS-CoV-2/FLU/RSV plus assay is intended as an aid in the diagnosis of influenza from Nasopharyngeal swab specimens and should not be used as a sole basis for treatment. Nasal washings and aspirates are unacceptable for Xpert Xpress SARS-CoV-2/FLU/RSV testing.  Fact Sheet for Patients: BloggerCourse.com  Fact Sheet for Healthcare Providers: SeriousBroker.it  This test is not yet approved or cleared by the [place]  FDA and has been authorized for detection and/or diagnosis of SARS-CoV-2 by FDA under an Emergency Use Authorization (EUA). This EUA will remain in effect (meaning this test can be used) for the duration of the COVID-19 declaration under Section 564(b)(1) of the Act, 21 U.S.C. section 360bbb-3(b)(1), unless the authorization is terminated or revoked.     Resp Syncytial Virus by PCR NEGATIVE NEGATIVE Final    Comment: (NOTE) Fact Sheet for Patients: BloggerCourse.com  Fact Sheet for Healthcare Providers: SeriousBroker.it  This test is not yet approved or cleared by the [place]  FDA and has been authorized for detection and/or diagnosis of SARS-CoV-2 by FDA under an Emergency Use Authorization (EUA). This EUA will remain in effect (meaning this test can be used) for the duration of the COVID-19 declaration under Section 564(b)(1) of the Act, 21 U.S.C. section 360bbb-3(b)(1), unless the authorization is terminated or revoked.  Performed at [facility], [address] [place], [place] [address]   Blood Culture ID Panel (Reflexed)     Status: Abnormal   Collection  Time: 04/05/24  1:26 AM  Result Value Ref Range Status   Enterococcus faecalis NOT DETECTED NOT DETECTED Final   Enterococcus Faecium NOT DETECTED NOT DETECTED Final   Listeria monocytogenes NOT DETECTED NOT DETECTED Final   Staphylococcus species DETECTED (A) NOT DETECTED Final    Comment: CRITICAL RESULT CALLED TO, READ BACK BY AND VERIFIED WITH: RAQUEL RODRIGUEZ @1738  04/05/24 [facility]    Staphylococcus aureus (BCID) DETECTED (A) NOT DETECTED Final    Comment: CRITICAL RESULT CALLED TO, READ BACK BY AND VERIFIED WITH: RAQUEL RODRIGUEZ @ 1738 04/05/24 [facility]    Staphylococcus epidermidis NOT DETECTED NOT DETECTED Final   Staphylococcus lugdunensis NOT DETECTED NOT DETECTED Final   Streptococcus species NOT DETECTED NOT DETECTED Final   Streptococcus agalactiae NOT DETECTED NOT DETECTED Final   Streptococcus pneumoniae NOT DETECTED NOT DETECTED Final   Streptococcus pyogenes NOT DETECTED NOT DETECTED Final   A.calcoaceticus-baumannii NOT DETECTED NOT DETECTED Final   Bacteroides fragilis NOT DETECTED NOT DETECTED Final   Enterobacterales NOT DETECTED NOT DETECTED Final   Enterobacter cloacae complex NOT DETECTED NOT DETECTED Final   Escherichia coli NOT DETECTED NOT DETECTED Final   Klebsiella aerogenes NOT DETECTED NOT DETECTED Final   Klebsiella oxytoca NOT DETECTED NOT DETECTED Final   Klebsiella pneumoniae NOT DETECTED NOT DETECTED Final   Proteus species NOT DETECTED NOT DETECTED Final   Salmonella species NOT DETECTED NOT DETECTED Final   Serratia marcescens NOT DETECTED NOT DETECTED Final   Haemophilus influenzae NOT DETECTED NOT DETECTED Final   Neisseria meningitidis NOT DETECTED NOT DETECTED Final   Pseudomonas aeruginosa NOT DETECTED NOT DETECTED Final   Stenotrophomonas maltophilia NOT DETECTED NOT DETECTED Final   Candida albicans NOT DETECTED NOT DETECTED Final   Candida auris NOT DETECTED NOT DETECTED Final   Candida glabrata NOT DETECTED NOT  DETECTED Final   Candida krusei  NOT DETECTED NOT DETECTED Final   Candida parapsilosis NOT DETECTED NOT DETECTED Final   Candida tropicalis NOT DETECTED NOT DETECTED Final   Cryptococcus neoformans/gattii NOT DETECTED NOT DETECTED Final   Meth resistant mecA/C and MREJ NOT DETECTED NOT DETECTED Final    Comment: Performed at [facility], [address] [place], [place] [address]  Urine Culture     Status: Abnormal (Preliminary result)   Collection Time: 04/05/24  1:55 AM   Specimen: Urine, Random  Result Value Ref Range Status   Specimen Description   Final    URINE, RANDOM Performed at [facility], [address] [place], [place] [address]    Special Requests   Final    NONE Reflexed from [id number] Performed at [facility], [address] [place], [place] [address]    Culture (A)  Final    >=100,000 COLONIES/mL GRAM NEGATIVE RODS CULTURE REINCUBATED FOR BETTER GROWTH IDENTIFICATION AND SUSCEPTIBILITIES TO FOLLOW Performed at [facility], [address], [place], [place] [address]    Report Status PENDING  Incomplete  Culture, blood (Routine X 2) w Reflex to ID Panel     Status: None (Preliminary result)   Collection Time: 04/06/24 11:03 PM   Specimen: BLOOD  Result Value Ref Range Status   Specimen Description BLOOD BLOOD RIGHT ARM  Final   Special Requests   Final    BOTTLES DRAWN AEROBIC AND ANAEROBIC Blood Culture adequate volume   Culture   Final    NO GROWTH < 12 HOURS Performed at [facility], [address] [place], [place] [address]    Report Status PENDING  Incomplete  Culture, blood (Routine X 2) w Reflex to ID Panel     Status: None (Preliminary result)   Collection Time: 04/06/24 11:05 PM   Specimen: BLOOD  Result Value Ref Range Status   Specimen Description BLOOD BLOOD LEFT ARM  Final   Special Requests   Final    BOTTLES DRAWN AEROBIC AND ANAEROBIC Blood Culture adequate volume   Culture   Final    NO GROWTH < 12 HOURS Performed at  [facility], [address] [place], [place] [address]    Report Status PENDING  Incomplete    IMAGING RESULTS: CXR no infiltrate CT head no acute findings  I have personally reviewed the films ? Impression/Recommendation MSSA bacteremia low bioburden Source could be the skin and entry point could be the forearm nodule which is likely a keratoacanthoma VS BCC VS SCC Culture sent Continue cefazolin   No hardware No murmur Risk for endocarditis is low Will do repeat blood cultures 2 d echo done  Pre syncope- management as per primary team Accompanied by explosive watery diarrhea - intermittently present since 2022, worse since 2024 Pruritus since this year Is this due to post cholecystectomy?- started on questran  by Dr.Griffth Could it be carcinoid ( not a typical presentation )  Ecoli and klebsiella in urine culture- true infection VS contaminant Will get post void bladder scan Susceptible to cefazolin  which he s currently on  Ulcerating nodule rt forearm- keratoacanthoma VS BCC VS SCC  CAD s/p CABG?  H/o cholecystectomy ? I have personally spent  -75--minutes involved in face-to-face and non-face-to-face activities for this patient on the day of the visit. Professional time spent includes the following activities: Preparing to see the patient (review of tests), Obtaining and/or reviewing separately obtained history (admission/discharge record), Performing a medically  appropriate examination and/or evaluation , Ordering medications/tests/procedures, referring and communicating with other health care professionals, Documenting clinical information in the EMR, Independently interpreting results (not separately reported), Communicating results to the patient/daughter, Counseling and educating the patient/daughter and Care coordination with hospitalist   This involved complex antimicrobial management  ________________________________________________  Note:  This  document was prepared using Dragon voice recognition software and may include unintentional dictation errors.

## 2024-04-07 NOTE — Progress Notes (Signed)
*  PRELIMINARY RESULTS* Echocardiogram 2D Echocardiogram has been performed.  David Atkins 04/07/2024, 8:51 AM

## 2024-04-08 DIAGNOSIS — B9561 Methicillin susceptible Staphylococcus aureus infection as the cause of diseases classified elsewhere: Secondary | ICD-10-CM | POA: Diagnosis not present

## 2024-04-08 DIAGNOSIS — R7881 Bacteremia: Secondary | ICD-10-CM | POA: Diagnosis not present

## 2024-04-08 DIAGNOSIS — R55 Syncope and collapse: Secondary | ICD-10-CM | POA: Diagnosis not present

## 2024-04-08 LAB — CULTURE, BLOOD (ROUTINE X 2): Special Requests: ADEQUATE

## 2024-04-08 LAB — BASIC METABOLIC PANEL WITH GFR
Anion gap: 8 (ref 5–15)
BUN: 7 mg/dL — ABNORMAL LOW (ref 8–23)
CO2: 24 mmol/L (ref 22–32)
Calcium: 8.3 mg/dL — ABNORMAL LOW (ref 8.9–10.3)
Chloride: 108 mmol/L (ref 98–111)
Creatinine, Ser: 0.83 mg/dL (ref 0.61–1.24)
GFR, Estimated: >60 mL/min (ref >60)
Glucose, Bld: 98 mg/dL (ref 70–99)
Potassium: 3.4 mmol/L — ABNORMAL LOW (ref 3.5–5.1)
Sodium: 140 mmol/L (ref 135–145)

## 2024-04-08 MED ORDER — POTASSIUM CHLORIDE CRYS ER 20 MEQ PO TBCR
40.0000 meq | EXTENDED_RELEASE_TABLET | Freq: Once | ORAL | Status: AC
  Administered 2024-04-08: 40 meq via ORAL
  Filled 2024-04-08: qty 2

## 2024-04-08 NOTE — Plan of Care (Signed)
 David Atkins

## 2024-04-08 NOTE — Progress Notes (Signed)
 Occupational Therapy Treatment Patient Details Name: David Atkins MRN: [id number] DOB: Sep 19, 1929 Today's Date: 04/08/2024   History of present illness Pt is a 89 y.o. male presents to ED via EMS from home after a near syncopal episode, hypotensive. Admitted with MSSA bacteremia 2/2 unclear source. PMH of  dCHF, hypertension, hyperlipidemia, COPD, CAD s/p CABG, GERD, BPH, BPPV, CVA found incidentally on imaging no symptoms, nonepileptic seizure (not taking medications), GAD, IBS, and OSA on CPAP.   OT comments  Pt seen for OT tx. Pt alert, pleasant, and agreeable to session. Pt denies complaints. Orthostatic vitals taken, negative and pt denies symptoms. Pt required MIN A to scoot anteriorly to EOB, CGA to stand and ambulate to the bathroom with RW. Pt requested additional time for BM. Call bell in reach and pt agreeable to use once done. Nurse tech notified. Pt continues to benefit from skilled OT services. Will continue to progress as able.       If plan is discharge home, recommend the following:  A little help with walking and/or transfers;A little help with bathing/dressing/bathroom   Equipment Recommendations  None recommended by OT    Recommendations for Other Services      Precautions / Restrictions Precautions Precautions: Fall Restrictions Weight Bearing Restrictions Per Provider Order: No       Mobility Bed Mobility Overal bed mobility: Needs Assistance Bed Mobility: Supine to Sit     Supine to sit: Min assist, HOB elevated, Used rails     General bed mobility comments: MIN A for scooting closer to the EOB    Transfers Overall transfer level: Needs assistance Equipment used: Rolling walker (2 wheels) Transfers: Sit to/from Stand Sit to Stand: Contact guard assist                 Balance Overall balance assessment: Needs assistance Sitting-balance support: Feet supported, Single extremity supported, No upper extremity supported Sitting balance-Leahy  Scale: Good     Standing balance support: No upper extremity supported, During functional activity Standing balance-Leahy Scale: Fair Standing balance comment: fair static standing at sink to wash hands                           ADL either performed or assessed with clinical judgement   ADL Overall ADL's : Needs assistance/impaired     Grooming: Standing;Supervision/safety;Contact guard assist;Oral care;Wash/dry face;Wash/dry Media planner (2 wheels);Contact guard assist;Supervision/safety Toilet Transfer Details (indicate cue type and reason): VC for BSC rail over toilet for UE support to improve transfer safety         Functional mobility during ADLs: Contact guard assist;Rolling walker (2 wheels)      Extremity/Trunk Assessment              Vision       Perception     Praxis     Communication Communication Communication: Impaired Factors Affecting Communication: Hearing impaired   Cognition Arousal: Alert Behavior During Therapy: WFL for tasks assessed/performed Cognition: No apparent impairments                               Following commands: Intact        Cueing   Cueing Techniques: Verbal cues  Exercises      Shoulder Instructions  General Comments      Pertinent Vitals/ Pain       Pain Assessment Pain Assessment: No/denies pain  Home Living                                          Prior Functioning/Environment              Frequency  Min 1X/week        Progress Toward Goals  OT Goals(current goals can now be found in the care plan section)  Progress towards OT goals: Progressing toward goals  Acute Rehab OT Goals Patient Stated Goal: return home OT Goal Formulation: With patient/family Time For Goal Achievement: 04/20/24 Potential to Achieve Goals: Good  Plan      Co-evaluation                 AM-PAC OT "6  Clicks" Daily Activity     Outcome Measure   Help from another person eating meals?: None Help from another person taking care of personal grooming?: None Help from another person toileting, which includes using toliet, bedpan, or urinal?: A Little Help from another person bathing (including washing, rinsing, drying)?: A Little Help from another person to put on and taking off regular upper body clothing?: None Help from another person to put on and taking off regular lower body clothing?: A Little 6 Click Score: 21    End of Session Equipment Utilized During Treatment: Rolling walker (2 wheels)  OT Visit Diagnosis: Other abnormalities of gait and mobility (R26.89);Muscle weakness (generalized) (M62.81)   Activity Tolerance Patient tolerated treatment well   Patient Left Other (comment) (seated on BSC over toilet with call bell for BM, RN notified)   Nurse Communication Mobility status;Other (comment) (in bathroom)        Time: 1610-9604 OT Time Calculation (min): 18 min  Charges: OT General Charges $OT Visit: 1 Visit OT Treatments $Self Care/Home Management : 8-22 mins  Berenda Breaker., MPH, MS, OTR/L ascom [phone] 04/08/24, 10:48 AM

## 2024-04-08 NOTE — Progress Notes (Signed)
 Date of Admission:  04/05/2024      ID: David Atkins is a 89 y.o. male  Principal Problem:   Near syncope Active Problems:   BPH (benign prostatic hyperplasia)   Hypertension   Hyperlipidemia LDL goal <70   BPPV (benign paroxysmal positional vertigo)   OSA on CPAP   CAD (coronary artery disease)   Chronic diastolic CHF (congestive heart failure) (HCC)   Urinary tract infection   MSSA bacteremia   Pressure injury of skin    Subjective: Patient is feeling better this morning Last night he did not sleep well . Daughter at bedside  Medications:   amLODipine   2.5 mg Oral Daily   atorvastatin   20 mg Oral Daily   cholecalciferol   1,000 Units Oral Daily   cholestyramine   4 g Oral Daily   enoxaparin  (LOVENOX ) injection  40 mg Subcutaneous Q24H   finasteride   5 mg Oral Daily   lisinopril   20 mg Oral Daily   metoprolol  tartrate  12.5 mg Oral BID   oxybutynin   5 mg Oral BID   pantoprazole   40 mg Oral Daily    Objective: Vital signs in last 24 hours: Patient Vitals for the past 24 hrs:  BP Temp Temp src Pulse Resp SpO2 Weight  04/08/24 2012 112/74 97.9 F (36.6 C) Oral 73 18 96 % --  04/08/24 1602 133/80 98.2 F (36.8 C) Oral 74 17 99 % --  04/08/24 0826 138/77 97.7 F (36.5 C) -- 79 18 99 % --  04/08/24 0455 (!) 150/85 (!) 97.5 F (36.4 C) Oral 78 18 98 % 75.9 kg  04/07/24 2112 (!) 154/77 98.6 F (37 C) Oral 68 16 98 % --       PHYSICAL EXAM:  General: Alert, cooperative, no distress, appears stated age.  Lungs: Clear to auscultation bilaterally. No Wheezing or Rhonchi. No rales. Heart: Regular rate and rhythm, no murmur, rub or gallop. Abdomen: Soft, non-tender,not distended. Bowel sounds normal. No masses Extremities: Right forearm nodular ulcerating lesion Skin: No rashes or lesions. Or bruising Lymph: Cervical, supraclavicular normal. Neurologic: Grossly non-focal  Lab Results    Latest Ref Rng & Units 04/06/2024    3:13 AM 04/05/2024    1:26 AM  09/13/2023    7:25 PM  CBC  WBC 4.0 - 10.5 K/uL 7.4  10.7  8.2   Hemoglobin 13.0 - 17.0 g/dL 04.5  40.9  81.1   Hematocrit 39.0 - 52.0 % 37.6  41.2  45.4   Platelets 150 - 400 K/uL 220  227  218        Latest Ref Rng & Units 04/08/2024    5:56 AM 04/07/2024    4:28 AM 04/06/2024    3:13 AM  CMP  Glucose 70 - 99 mg/dL 98  87  99   BUN 8 - 23 mg/dL 7  8  10    Creatinine 0.61 - 1.24 mg/dL 9.14  7.82  9.56   Sodium 135 - 145 mmol/L 140  142  143   Potassium 3.5 - 5.1 mmol/L 3.4  3.3  3.2   Chloride 98 - 111 mmol/L 108  108  110   CO2 22 - 32 mmol/L 24  25  25    Calcium  8.9 - 10.3 mg/dL 8.3  8.0  8.2       Microbiology: 04/05/2024 blood culture 1 out of 2 sets Staph aureus Urine culture E. coli and Klebsiella pneumoniae both sensitive to cefazolin   04/07/2019 4 repeat blood  culture no growth so far  04/07/2024 right forearm wound culture abundant Staphylococcus aureus  Studies/Results: ECHOCARDIOGRAM COMPLETE Result Date: 04/07/2024    ECHOCARDIOGRAM REPORT   Patient Name:   David Atkins Date of Exam: 04/07/2024 Medical Rec #:  [id number]      Height:       70.0 in Accession #:    [id number]     Weight:       165.1 lb Date of Birth:  Feb 02, 1929       BSA:          1.924 m Patient Age:    89 years       BP:           158/81 mmHg Patient Gender: M              HR:           72 bpm. Exam Location:  [facility] Procedure: 2D Echo, Cardiac Doppler and Color Doppler (Both Spectral and Color            Flow Doppler were utilized during procedure). Indications:     Syncope R55  History:         Patient has prior history of Echocardiogram examinations, most                  recent 09/27/2022. Prior CABG; Risk Factors:Hypertension.  Sonographer:     Broadus Canes Referring Phys:  [id number] KATY L FOUST Diagnosing Phys: Lida Reeks Alluri IMPRESSIONS  1. Left ventricular ejection fraction, by estimation, is 55 to 60%. The left ventricle has normal function. The left ventricle has no regional wall motion abnormalities. There is  mild left ventricular hypertrophy. Left ventricular diastolic parameters were normal.  2. Right ventricular systolic function is normal. The right ventricular size is normal.  3. The mitral valve is normal in structure. Trivial mitral valve regurgitation.  4. The aortic valve was not well visualized. Aortic valve regurgitation is not visualized. Aortic valve sclerosis is present, with no evidence of aortic valve stenosis. FINDINGS  Left Ventricle: Left ventricular ejection fraction, by estimation, is 55 to 60%. The left ventricle has normal function. The left ventricle has no regional wall motion abnormalities. The left ventricular internal cavity size was normal in size. There is  mild left ventricular hypertrophy. Left ventricular diastolic parameters were normal. Right Ventricle: The right ventricular size is normal. No increase in right ventricular wall thickness. Right ventricular systolic function is normal. Left Atrium: Left atrial size was normal in size. Right Atrium: Right atrial size was normal in size. Pericardium: There is no evidence of pericardial effusion. Mitral Valve: The mitral valve is normal in structure. Trivial mitral valve regurgitation. MV peak gradient, 5.8 mmHg. The mean mitral valve gradient is 2.0 mmHg. Tricuspid Valve: The tricuspid valve is not well visualized. Tricuspid valve regurgitation is trivial. Aortic Valve: The aortic valve was not well visualized. Aortic valve regurgitation is not visualized. Aortic valve sclerosis is present, with no evidence of aortic valve stenosis. Aortic valve mean gradient measures 3.5 mmHg. Aortic valve peak gradient measures 6.2 mmHg. Aortic valve area, by VTI measures 2.00 cm. Pulmonic Valve: The pulmonic valve was not well visualized. Pulmonic valve regurgitation is not visualized. Aorta: The aortic root is normal in size and structure. Venous: The inferior vena cava was not well visualized. IAS/Shunts: The atrial septum is grossly normal.  LEFT  VENTRICLE PLAX 2D LVIDd:         4.00 cm   Diastology LVIDs:  2.90 cm   LV e' medial:    6.42 cm/s LV PW:         0.70 cm   LV E/e' medial:  10.9 LV IVS:        1.40 cm   LV e' lateral:   9.79 cm/s LVOT diam:     2.00 cm   LV E/e' lateral: 7.1 LV SV:         55 LV SV Index:   29 LVOT Area:     3.14 cm  RIGHT VENTRICLE RV Basal diam:  3.10 cm RV Mid diam:    2.10 cm RV S prime:     10.00 cm/s TAPSE (M-mode): 1.8 cm LEFT ATRIUM             Index        RIGHT ATRIUM           Index LA diam:        2.80 cm 1.46 cm/m   RA Area:     14.70 cm LA Vol (A2C):   28.7 ml 14.92 ml/m  RA Volume:   35.50 ml  18.45 ml/m LA Vol (A4C):   37.7 ml 19.59 ml/m LA Biplane Vol: 34.1 ml 17.72 ml/m  AORTIC VALVE AV Area (Vmax):    1.91 cm AV Area (Vmean):   1.92 cm AV Area (VTI):     2.00 cm AV Vmax:           125.00 cm/s AV Vmean:          84.200 cm/s AV VTI:            0.276 m AV Peak Grad:      6.2 mmHg AV Mean Grad:      3.5 mmHg LVOT Vmax:         75.80 cm/s LVOT Vmean:        51.400 cm/s LVOT VTI:          0.175 m LVOT/AV VTI ratio: 0.64  AORTA Ao Root diam: 3.10 cm MITRAL VALVE                TRICUSPID VALVE MV Area (PHT): 3.93 cm     TR Peak grad:   17.1 mmHg MV Area VTI:   2.45 cm     TR Vmax:        207.00 cm/s MV Peak grad:  5.8 mmHg MV Mean grad:  2.0 mmHg     SHUNTS MV Vmax:       1.20 m/s     Systemic VTI:  0.18 m MV Vmean:      65.4 cm/s    Systemic Diam: 2.00 cm MV Decel Time: 193 msec MV E velocity: 69.70 cm/s MV A velocity: 112.00 cm/s MV E/A ratio:  0.62 Lida Reeks Alluri Electronically signed by Joetta Mustache Signature Date/Time: 04/07/2024/12:22:13 PM    Final      Assessment/Plan:  MSSA bacteremia source is the skin wound Patient is on cefazolin  2D echo not an optimal study Patient does not have a hardware or murmur because of his age and having a source for the bacteremia we will not pursue TEE.  Daughter is also not in favor of TEE Repeat Blood culture negative so far Will do a total of 2  weeks of IV cefazolin . Will get a midline tomorrow  Presyncope management as per primary team  E. coli and Klebsiella in urine culture contaminant versus a true infection Bladder scan not significant around 100 cc Both  organisms susceptible to Ceftin will in which she is getting for the Staph aureus bacteremia  Ulcerating nodule right forearm.  Keratoacanthoma versus BCC versus SCC Secondarily infected with Staph aureus  History of CAD status post CABG  History of cholecystectomy   Discussed the management with the patient and his daughter.

## 2024-04-08 NOTE — TOC Progression Note (Signed)
 Transition of Care [facility]) - Progression Note    Patient Details  Name: David Atkins MRN: [id number] Date of Birth: 07-18-1929  [facility] of [facility]) CM/SW Contact  Elsie Halo, RN Phone Number: 04/08/2024, 1:17 PM  Clinical Narrative:    Patient is from home with his wife and daughter. His grandson also visits often to help them out. He sees Dr. Madelon Scheuermann and uses [employer]. He has a rollator, shower chair, and raised commode seat. He has uses [facility] in the past.         Expected Discharge Plan and Services                                               Social Determinants of Health (SDOH) Interventions SDOH Screenings   Food Insecurity: No Food Insecurity (04/05/2024)  Housing: Unknown (04/05/2024)  Transportation Needs: No Transportation Needs (04/05/2024)  Utilities: Not At Risk (04/05/2024)  Alcohol Screen: Low Risk  (07/24/2023)  Depression (PHQ2-9): Medium Risk (10/26/2023)  Financial Resource Strain: Low Risk  (07/24/2023)  Physical Activity: Inactive (07/24/2023)  Social Connections: Socially Isolated (04/06/2024)  Stress: No Stress Concern Present (07/24/2023)  Tobacco Use: Medium Risk (04/05/2024)    Readmission Risk Interventions     No data to display

## 2024-04-08 NOTE — Progress Notes (Signed)
 Progress Note   Patient: David Atkins WGN:[id number] DOB: January 25, 1929 DOA: 04/05/2024     2 DOS: the patient was seen and examined on 04/08/2024   Brief hospital course: HPI on admission: David Atkins is a 89 y.o. male with medical history significant of  dCHF, hypertension, hyperlipidemia, COPD, CAD s/p CABG, GERD, BPH, BPPV, CVA found incidentally on imaging no symptoms, nonepileptic seizure (not taking medications), GAD, IBS, and OSA on CPAP.  He presents to [facility] regional ED via EMS from home after a near syncopal episode. Daughter reports in the past that he has had similar episodes of dizziness and incontinence of stool however last night he was also noted to be hypotensive (115/97-->95/59) which prompted them to call EMS. He remained alert and did not have loss of consciousness. He denies fevers, chills, headache, vision changes, chest pain, palpitations, dyspnea, abdominal pain, nausea, vomiting. He also denies dysuria but does endorse difficulty starting stream    Patient was found to a UTI during ED evaluation, was admitted and started on empiric IV antibiotics.  Blood cultures subsequently positive for MSSA.  Infectious disease consulted.    Further hospital course and management as outlined below.  5/4 - urine culture +gram negative rods, reincubated for better growth  5/5 Ucx >100k CFU E coli, 30k Klebsiella pna. Pan sensitive.   5/6 Wound Cx from RUE lesion abundant staph aureus.   Assessment and Plan:  MSSA Bacteremia -- unclear source at this time. UTI is GNR.  ?Skin possibly, recent biopsy left upper back / shoulder.  Has wound on RUE as well.   --On Ancef , de-escalated from Rocephin --RUE wound Cx abundant staph aureus  --Repeat Bcx NGTD --2D Echo - no vegetations seen --ID is consulted --May require TEE --Continue ANCEF   --Follow repeat blood cultures    Urinary Tract Infection 5/4 - Urine cx +GNR's, >100k CFU E coli, 30K CFU klebsiella pna  --Continue  Ancef    Near Syncope  Occurred in setting of low BP at home, possibly in setting of his infections v dehydration v combination.  History of similar episodes intermittently, daughter will push electrolyte fluids and typically he improves quickly. Troponin: 7-->7 CT Head: NEGATIVE Transthoracic ECHO: EF 55-60%, mild LVH, no significant valvular disease Orthostatic vitals this AM negative.  Continue to monitor    Pruritus - possibly related to bile acids, no associated rash. Pt and family described severe diffuse itching of his upper body, this has since improved since starting cholestyramine .  Post-cholecystectomy diarrhea-Improved  --Continue cholestyramine , 4 mg daily --Titrate cholestyramine  dose   Hypokalemia- K 3.4  --Replacing with PO Kcl --Monitor BMP, replace K PRN  Chronic diastolic CHF - compensated.  Echo this admission EF 55-60% and normal diastolic function, mild LVH - on metoprolol , not on diuretics   Hypertension BP in 140s to 150s. Avoid strict BP control given orthostasis. Continue home amlodipine , lisinopril , and metoprolol    CAD - Continue home lipitor   BPH - Continue home proscar  and Silodosin formulary alternative   OSA - Continue home qHS CPAP      Subjective: Patient reports that after starting medication cholestyramine  his itching has significantly improved.  He also notes that he has had fewer bowel movements, noting he only had 1 bowel movement this morning.  He continues to have some urinary frequency and urgency but notes this is something that he has dealt with chronically given his BPH.  Physical Exam: Vitals:   04/07/24 1712 04/07/24 2112 04/08/24 0455 04/08/24 0826  BP: [name])  148/85 (!) 154/77 (!) 150/85 138/77  Pulse: 68 68 78 79  Resp: 18 16 18 18   Temp: 98 F (36.7 C) 98.6 F (37 C) (!) 97.5 F (36.4 C) 97.7 F (36.5 C)  TempSrc:  Oral Oral   SpO2: 100% 98% 98% 99%  Weight:   75.9 kg   Height:       Physical Exam   Constitutional: In no distress.  Cardiovascular: Normal rate, regular rhythm.  Trace lower extremity edema  Pulmonary: Non labored breathing on room air, no wheezing or rales.   Abdominal: Soft. Normal bowel sounds. Non distended and non tender Musculoskeletal: Normal range of motion.     Neurological: Alert and oriented to person, place, and time. Non focal  Skin: Skin is warm and dry.    Data Reviewed:  Notable labs --     Latest Ref Rng & Units 04/08/2024    5:56 AM 04/07/2024    4:28 AM 04/06/2024    3:13 AM  BMP  Glucose 70 - 99 mg/dL 98  87  99   BUN 8 - 23 mg/dL 7  8  10    Creatinine 0.61 - 1.24 mg/dL 4.09  8.11  9.14   Sodium 135 - 145 mmol/L 140  142  143   Potassium 3.5 - 5.1 mmol/L 3.4  3.3  3.2   Chloride 98 - 111 mmol/L 108  108  110   CO2 22 - 32 mmol/L 24  25  25    Calcium  8.9 - 10.3 mg/dL 8.3  8.0  8.2         Latest Ref Rng & Units 04/06/2024    3:13 AM 04/05/2024    1:26 AM 09/13/2023    7:25 PM  CBC  WBC 4.0 - 10.5 K/uL 7.4  10.7  8.2   Hemoglobin 13.0 - 17.0 g/dL 78.2  95.6  21.3   Hematocrit 39.0 - 52.0 % 37.6  41.2  45.4   Platelets 150 - 400 K/uL 220  227  218      Micro:  Bcx 5/3 MSSA BCX 5/4 NGTD  Wound Cx RUE wound abundant staph aureus   UCX 5/3 >100K CFU E coli, 30K Klebsiella pna Pan sensitive   Results for orders placed or performed during the hospital encounter of 04/05/24  Culture, blood (routine x 2)     Status: Abnormal   Collection Time: 04/05/24  1:26 AM   Specimen: BLOOD  Result Value Ref Range Status   Specimen Description   Final    BLOOD BLOOD LEFT ARM Performed at [facility], [address] [place], [place] [address]    Special Requests   Final    BOTTLES DRAWN AEROBIC AND ANAEROBIC Blood Culture adequate volume Performed at [facility], [address] [place], [place] [address]    Culture  Setup Time   Final    Organism ID to follow GRAM POSITIVE COCCI AEROBIC BOTTLE ONLY CRITICAL RESULT  CALLED TO, READ BACK BY AND VERIFIED WITH: RAQUEL RODRIGUEZ @ 1738 04/05/24 B[facility] Performed at [facility], [address], [place], [place] [address]    Culture STAPHYLOCOCCUS AUREUS (A)  Final   Report Status 04/08/2024 FINAL  Final   Organism ID, Bacteria STAPHYLOCOCCUS AUREUS  Final      Susceptibility   Staphylococcus aureus - MIC*    CIPROFLOXACIN <=0.5 SENSITIVE Sensitive     ERYTHROMYCIN RESISTANT Resistant     GENTAMICIN <=0.5 SENSITIVE Sensitive  OXACILLIN <=0.25 SENSITIVE Sensitive     TETRACYCLINE <=1 SENSITIVE Sensitive     VANCOMYCIN  <=0.5 SENSITIVE Sensitive     TRIMETH/SULFA <=10 SENSITIVE Sensitive     CLINDAMYCIN RESISTANT Resistant     RIFAMPIN <=0.5 SENSITIVE Sensitive     Inducible Clindamycin POSITIVE Resistant     LINEZOLID 2 SENSITIVE Sensitive     * STAPHYLOCOCCUS AUREUS  Culture, blood (routine x 2)     Status: None (Preliminary result)   Collection Time: 04/05/24  1:26 AM   Specimen: BLOOD  Result Value Ref Range Status   Specimen Description BLOOD BLOOD RIGHT ARM  Final   Special Requests   Final    BOTTLES DRAWN AEROBIC AND ANAEROBIC Blood Culture adequate volume   Culture   Final    NO GROWTH 3 DAYS Performed at [facility], [address] [place], [place] [address]    Report Status PENDING  Incomplete  Resp panel by RT-PCR (RSV, Flu A&B, Covid) Anterior Nasal Swab     Status: None   Collection Time: 04/05/24  1:26 AM   Specimen: Anterior Nasal Swab  Result Value Ref Range Status   SARS Coronavirus 2 by RT PCR NEGATIVE NEGATIVE Final    Comment: (NOTE) SARS-CoV-2 target nucleic acids are NOT DETECTED.  The SARS-CoV-2 RNA is generally detectable in upper respiratory specimens during the acute phase of infection. The lowest concentration of SARS-CoV-2 viral copies this assay can detect is 138 copies/mL. A negative result does not preclude SARS-Cov-2 infection and should not be used as the sole basis for treatment or other  patient management decisions. A negative result may occur with  improper specimen collection/handling, submission of specimen other than nasopharyngeal swab, presence of viral mutation(s) within the areas targeted by this assay, and inadequate number of viral copies(<138 copies/mL). A negative result must be combined with clinical observations, patient history, and epidemiological information. The expected result is Negative.  Fact Sheet for Patients:  BloggerCourse.com  Fact Sheet for Healthcare Providers:  SeriousBroker.it  This test is no t yet approved or cleared by the [place]  FDA and  has been authorized for detection and/or diagnosis of SARS-CoV-2 by FDA under an Emergency Use Authorization (EUA). This EUA will remain  in effect (meaning this test can be used) for the duration of the COVID-19 declaration under Section 564(b)(1) of the Act, 21 U.S.C.section 360bbb-3(b)(1), unless the authorization is terminated  or revoked sooner.       Influenza A by PCR NEGATIVE NEGATIVE Final   Influenza B by PCR NEGATIVE NEGATIVE Final    Comment: (NOTE) The Xpert Xpress SARS-CoV-2/FLU/RSV plus assay is intended as an aid in the diagnosis of influenza from Nasopharyngeal swab specimens and should not be used as a sole basis for treatment. Nasal washings and aspirates are unacceptable for Xpert Xpress SARS-CoV-2/FLU/RSV testing.  Fact Sheet for Patients: BloggerCourse.com  Fact Sheet for Healthcare Providers: SeriousBroker.it  This test is not yet approved or cleared by the [place]  FDA and has been authorized for detection and/or diagnosis of SARS-CoV-2 by FDA under an Emergency Use Authorization (EUA). This EUA will remain in effect (meaning this test can be used) for the duration of the COVID-19 declaration under Section 564(b)(1) of the Act, 21 U.S.C. section  360bbb-3(b)(1), unless the authorization is terminated or revoked.     Resp Syncytial Virus by PCR NEGATIVE NEGATIVE Final    Comment: (NOTE) Fact Sheet for Patients: BloggerCourse.com  Fact Sheet for Healthcare Providers: SeriousBroker.it  This  test is not yet approved or cleared by the [place]  FDA and has been authorized for detection and/or diagnosis of SARS-CoV-2 by FDA under an Emergency Use Authorization (EUA). This EUA will remain in effect (meaning this test can be used) for the duration of the COVID-19 declaration under Section 564(b)(1) of the Act, 21 U.S.C. section 360bbb-3(b)(1), unless the authorization is terminated or revoked.  Performed at [facility], [address] [place], [place] [address]   Blood Culture ID Panel (Reflexed)     Status: Abnormal   Collection Time: 04/05/24  1:26 AM  Result Value Ref Range Status   Enterococcus faecalis NOT DETECTED NOT DETECTED Final   Enterococcus Faecium NOT DETECTED NOT DETECTED Final   Listeria monocytogenes NOT DETECTED NOT DETECTED Final   Staphylococcus species DETECTED (A) NOT DETECTED Final    Comment: CRITICAL RESULT CALLED TO, READ BACK BY AND VERIFIED WITH: RAQUEL RODRIGUEZ @1738  04/05/24 BG    Staphylococcus aureus (BCID) DETECTED (A) NOT DETECTED Final    Comment: CRITICAL RESULT CALLED TO, READ BACK BY AND VERIFIED WITH: RAQUEL RODRIGUEZ @ 1738 04/05/24 [facility]    Staphylococcus epidermidis NOT DETECTED NOT DETECTED Final   Staphylococcus lugdunensis NOT DETECTED NOT DETECTED Final   Streptococcus species NOT DETECTED NOT DETECTED Final   Streptococcus agalactiae NOT DETECTED NOT DETECTED Final   Streptococcus pneumoniae NOT DETECTED NOT DETECTED Final   Streptococcus pyogenes NOT DETECTED NOT DETECTED Final   A.calcoaceticus-baumannii NOT DETECTED NOT DETECTED Final   Bacteroides fragilis NOT DETECTED NOT DETECTED Final   Enterobacterales NOT  DETECTED NOT DETECTED Final   Enterobacter cloacae complex NOT DETECTED NOT DETECTED Final   Escherichia coli NOT DETECTED NOT DETECTED Final   Klebsiella aerogenes NOT DETECTED NOT DETECTED Final   Klebsiella oxytoca NOT DETECTED NOT DETECTED Final   Klebsiella pneumoniae NOT DETECTED NOT DETECTED Final   Proteus species NOT DETECTED NOT DETECTED Final   Salmonella species NOT DETECTED NOT DETECTED Final   Serratia marcescens NOT DETECTED NOT DETECTED Final   Haemophilus influenzae NOT DETECTED NOT DETECTED Final   Neisseria meningitidis NOT DETECTED NOT DETECTED Final   Pseudomonas aeruginosa NOT DETECTED NOT DETECTED Final   Stenotrophomonas maltophilia NOT DETECTED NOT DETECTED Final   Candida albicans NOT DETECTED NOT DETECTED Final   Candida auris NOT DETECTED NOT DETECTED Final   Candida glabrata NOT DETECTED NOT DETECTED Final   Candida krusei NOT DETECTED NOT DETECTED Final   Candida parapsilosis NOT DETECTED NOT DETECTED Final   Candida tropicalis NOT DETECTED NOT DETECTED Final   Cryptococcus neoformans/gattii NOT DETECTED NOT DETECTED Final   Meth resistant mecA/C and MREJ NOT DETECTED NOT DETECTED Final    Comment: Performed at [facility], [address] [place], [place] [address]  Urine Culture     Status: Abnormal   Collection Time: 04/05/24  1:55 AM   Specimen: Urine, Random  Result Value Ref Range Status   Specimen Description   Final    URINE, RANDOM Performed at [facility], [address] [place], [place] [address]    Special Requests   Final    NONE Reflexed from [id number] Performed at [facility], [address] [place], [place] [address]    Culture (A)  Final    >=100,000 COLONIES/mL ESCHERICHIA COLI 30,000 COLONIES/mL KLEBSIELLA PNEUMONIAE    Report Status 04/07/2024 FINAL  Final   Organism ID, Bacteria ESCHERICHIA COLI (A)  Final   Organism ID, Bacteria KLEBSIELLA PNEUMONIAE (A)  Final  Susceptibility    Escherichia coli - MIC*    AMPICILLIN <=2 SENSITIVE Sensitive     CEFAZOLIN  <=4 SENSITIVE Sensitive     CEFEPIME  <=0.12 SENSITIVE Sensitive     CEFTRIAXONE <=0.25 SENSITIVE Sensitive     CIPROFLOXACIN <=0.25 SENSITIVE Sensitive     GENTAMICIN <=1 SENSITIVE Sensitive     IMIPENEM 1 SENSITIVE Sensitive     NITROFURANTOIN <=16 SENSITIVE Sensitive     TRIMETH/SULFA <=20 SENSITIVE Sensitive     AMPICILLIN/SULBACTAM <=2 SENSITIVE Sensitive     PIP/TAZO <=4 SENSITIVE Sensitive ug/mL    * >=100,000 COLONIES/mL ESCHERICHIA COLI   Klebsiella pneumoniae - MIC*    AMPICILLIN >=32 RESISTANT Resistant     CEFAZOLIN  <=4 SENSITIVE Sensitive     CEFEPIME  <=0.12 SENSITIVE Sensitive     CEFTRIAXONE <=0.25 SENSITIVE Sensitive     CIPROFLOXACIN <=0.25 SENSITIVE Sensitive     GENTAMICIN <=1 SENSITIVE Sensitive     IMIPENEM <=0.25 SENSITIVE Sensitive     NITROFURANTOIN 128 RESISTANT Resistant     TRIMETH/SULFA <=20 SENSITIVE Sensitive     AMPICILLIN/SULBACTAM 4 SENSITIVE Sensitive     PIP/TAZO <=4 SENSITIVE Sensitive ug/mL    * 30,000 COLONIES/mL KLEBSIELLA PNEUMONIAE  Culture, blood (Routine X 2) w Reflex to ID Panel     Status: None (Preliminary result)   Collection Time: 04/06/24 11:03 PM   Specimen: BLOOD  Result Value Ref Range Status   Specimen Description BLOOD BLOOD RIGHT ARM  Final   Special Requests   Final    BOTTLES DRAWN AEROBIC AND ANAEROBIC Blood Culture adequate volume   Culture   Final    NO GROWTH 2 DAYS Performed at [facility], [address] [place], [place] [address]    Report Status PENDING  Incomplete  Culture, blood (Routine X 2) w Reflex to ID Panel     Status: None (Preliminary result)   Collection Time: 04/06/24 11:05 PM   Specimen: BLOOD  Result Value Ref Range Status   Specimen Description BLOOD BLOOD LEFT ARM  Final   Special Requests   Final    BOTTLES DRAWN AEROBIC AND ANAEROBIC Blood Culture adequate volume   Culture   Final    NO GROWTH 2  DAYS Performed at [facility], [address] [place], [place] [address]    Report Status PENDING  Incomplete  Aerobic Culture w Gram Stain (superficial specimen)     Status: None (Preliminary result)   Collection Time: 04/07/24  5:53 PM   Specimen: Wound  Result Value Ref Range Status   Specimen Description   Final    WOUND Performed at [facility], [address], [place], [place] [address]    Special Requests   Final    FOREARM Performed at [facility], [address] [place], [place] [address]    Gram Stain NO WBC SEEN FEW GRAM POSITIVE COCCI   Final   Culture   Final    ABUNDANT STAPHYLOCOCCUS AUREUS CULTURE REINCUBATED FOR BETTER GROWTH Performed at [facility], [address], [place], [place] [address]    Report Status PENDING  Incomplete       Family Communication: Daughter updated via phone   Disposition: Status is: Inpatient Remains inpatient appropriate because: on IV antibiotics and ongoing evaluation of bacteremia   Planned Discharge Destination: Home with Home Health    Time spent: 42 minutes  Author: Joette Mustard, MD 04/08/2024 2:10 PM  For on call review [email or web address].

## 2024-04-09 DIAGNOSIS — R7881 Bacteremia: Secondary | ICD-10-CM | POA: Diagnosis not present

## 2024-04-09 DIAGNOSIS — B9561 Methicillin susceptible Staphylococcus aureus infection as the cause of diseases classified elsewhere: Secondary | ICD-10-CM | POA: Diagnosis not present

## 2024-04-09 DIAGNOSIS — R55 Syncope and collapse: Secondary | ICD-10-CM | POA: Diagnosis not present

## 2024-04-09 LAB — BASIC METABOLIC PANEL WITH GFR
Anion gap: 7 (ref 5–15)
BUN: 7 mg/dL — ABNORMAL LOW (ref 8–23)
CO2: 24 mmol/L (ref 22–32)
Calcium: 8.3 mg/dL — ABNORMAL LOW (ref 8.9–10.3)
Chloride: 110 mmol/L (ref 98–111)
Creatinine, Ser: 0.77 mg/dL (ref 0.61–1.24)
GFR, Estimated: >60 mL/min (ref >60)
Glucose, Bld: 93 mg/dL (ref 70–99)
Potassium: 3.5 mmol/L (ref 3.5–5.1)
Sodium: 141 mmol/L (ref 135–145)

## 2024-04-09 LAB — MAGNESIUM: Magnesium: 1.8 mg/dL (ref 1.7–2.4)

## 2024-04-09 MED ORDER — POTASSIUM CHLORIDE CRYS ER 20 MEQ PO TBCR
20.0000 meq | EXTENDED_RELEASE_TABLET | Freq: Every day | ORAL | Status: AC
  Administered 2024-04-09 – 2024-04-10 (×2): 20 meq via ORAL
  Filled 2024-04-09 (×2): qty 1

## 2024-04-09 MED ORDER — SODIUM CHLORIDE 0.9% FLUSH
10.0000 mL | Freq: Two times a day (BID) | INTRAVENOUS | Status: DC
  Administered 2024-04-09 – 2024-04-10 (×3): 10 mL

## 2024-04-09 MED ORDER — SODIUM CHLORIDE 0.9% FLUSH: 10.0000 mL | INTRAVENOUS | Status: DC | PRN

## 2024-04-09 NOTE — TOC Progression Note (Signed)
 Transition of Care [facility]) - Progression Note    Patient Details  Name: David Atkins MRN: [id number] Date of Birth: 1929/09/29  [facility] CM/SW Contact  Elsie Halo, RN Phone Number: 04/09/2024, 2:30 PM  Clinical Narrative:    10:00 AM: Recevied a secure chat from Dustin Zeiglar [facility] advising that he reached out to [facility] about the patient's home IV ABX.  11:00 AM: TOC Outreached to Kay Parson with [facility] to follow-up on IV abx  1:51 PM: TOC Outreached to Kay Parson with [facility] and made referral for Home IV ABX,  Patient has used [facility] before. TOC outreached to [place] [phone] and received instructions to fax orders to [phone] when available.  TOC will continue to follow.          Expected Discharge Plan and Services                                               Social Determinants of Health (SDOH) Interventions SDOH Screenings   Food Insecurity: No Food Insecurity (04/05/2024)  Housing: Unknown (04/05/2024)  Transportation Needs: No Transportation Needs (04/05/2024)  Utilities: Not At Risk (04/05/2024)  Alcohol Screen: Low Risk  (07/24/2023)  Depression (PHQ2-9): Medium Risk (10/26/2023)  Financial Resource Strain: Low Risk  (07/24/2023)  Physical Activity: Inactive (07/24/2023)  Social Connections: Socially Isolated (04/06/2024)  Stress: No Stress Concern Present (07/24/2023)  Tobacco Use: Medium Risk (04/05/2024)    Readmission Risk Interventions     No data to display

## 2024-04-09 NOTE — Progress Notes (Addendum)
 Progress Note    David Atkins  ZOX:[id number] DOB: Apr 21, 1929  DOA: 04/05/2024 PCP: Thersia Flax, MD      Brief Narrative:    Medical records reviewed and are as summarized below:  David Atkins is a 89 y.o. male  with medical history significant for  dCHF, hypertension, hyperlipidemia, COPD, CAD s/p CABG, GERD, BPH, BPPV, CVA found incidentally on imaging no symptoms, nonepileptic seizure (not taking medications), GAD, IBS, and OSA on CPAP.  He presented to the hospital after a near syncopal episode. Daughter reports in the past that he has had similar episodes of dizziness and incontinence of stool.  However, he was noted to be hypotensive (BP 95/59) the night prior to admission.   Patient was found to have UTI during ED evaluation, was admitted and started on empiric IV antibiotics.  Blood cultures subsequently positive for MSSA.  Infectious disease consulted.     Further hospital course and management as outlined below.   5/4 - urine culture +gram negative rods, reincubated for better growth   5/5 Ucx >100k CFU E coli, 30k Klebsiella pna. Pan sensitive.    5/6 Wound Cx from RUE lesion abundant staph aureus.      Assessment/Plan:   Principal Problem:   Near syncope Active Problems:   Chronic diastolic CHF (congestive heart failure) (HCC)   Hypertension   Hyperlipidemia LDL goal <70   CAD (coronary artery disease)   BPH (benign prostatic hyperplasia)   OSA on CPAP   BPPV (benign paroxysmal positional vertigo)   Urinary tract infection   MSSA bacteremia   Pressure injury of skin    Body mass index is 24.04 kg/m.   MSSA Bacteremia, right forearm ulcerated nodule UTI is GNR.  Right forearm wound Cx abundant staph aureus yesterday.  Blood culture showed MSSA --On Ancef , de-escalated from Rocephin --Repeat Bcx NGTD --2D Echo - no vegetations seen --ID recommended IV Ancef  through 04/20/2024 -- No plan for TEE because of advanced age and also source of  bacteremia is likely from ulcerated nodule on right forearm. Midline catheter ordered today.    Urinary Tract Infection 5/4 - Urine cx +GNR's, >100k CFU E coli, 30K CFU klebsiella pneumoniae --Continue Ancef      Near Syncope  Occurred in setting of low BP at home, possibly in setting of his infections v dehydration v combination.  History of similar episodes intermittently, daughter will push electrolyte fluids and typically he improves quickly. Troponin: 7-->7 CT Head: NEGATIVE Transthoracic ECHO: EF 55-60%, mild LVH, no significant valvular disease Orthostatic vital signs negative on 04/08/2024    Pruritus - possibly related to bile acids, no associated rash. Pt and family had described severe diffuse itching of his upper body.  This has since improved since starting cholestyramine .    Post-cholecystectomy diarrhea-Improved  --Continue cholestyramine , 4 mg daily    Hypokalemia- Improving.  Continue potassium repletion.    Chronic diastolic CHF - compensated.  Echo this admission EF 55-60% and normal diastolic function, mild LVH - on metoprolol , not on diuretics   Comorbidities include hypertension, CAD, BPH, OSA on CPAP          Diet Order             Diet Heart Room service appropriate? Yes; Fluid consistency: Thin  Diet effective now                            Consultants: ID specialist  Procedures:  None    Medications:    amLODipine   2.5 mg Oral Daily   atorvastatin   20 mg Oral Daily   cholecalciferol   1,000 Units Oral Daily   cholestyramine   4 g Oral Daily   enoxaparin  (LOVENOX ) injection  40 mg Subcutaneous Q24H   finasteride   5 mg Oral Daily   lisinopril   20 mg Oral Daily   metoprolol  tartrate  12.5 mg Oral BID   oxybutynin   5 mg Oral BID   pantoprazole   40 mg Oral Daily   potassium chloride   20 mEq Oral Daily   sodium chloride  flush  10-40 mL Intracatheter Q12H   Continuous Infusions:   ceFAZolin  (ANCEF ) IV 2 g (04/09/24  1354)     Anti-infectives (From admission, onward)    Start     Dose/Rate Route Frequency Ordered Stop   04/06/24 0200  cefTRIAXone (ROCEPHIN) 2 g in sodium chloride  0.9 % 100 mL IVPB  Status:  Discontinued        2 g 200 mL/hr over 30 Minutes Intravenous Every 24 hours 04/05/24 0758 04/05/24 1951   04/05/24 2200  ceFAZolin  (ANCEF ) IVPB 2g/100 mL premix        2 g 200 mL/hr over 30 Minutes Intravenous Every 8 hours 04/05/24 1956     04/05/24 0230  cefTRIAXone (ROCEPHIN) 1 g in sodium chloride  0.9 % 100 mL IVPB        1 g 200 mL/hr over 30 Minutes Intravenous  Once 04/05/24 0226 04/05/24 1610              Family Communication/Anticipated D/C date and plan/Code Status   DVT prophylaxis: enoxaparin  (LOVENOX ) injection 40 mg Start: 04/05/24 2200     Code Status: Limited: Do not attempt resuscitation (DNR) -DNR-LIMITED -Do Not Intubate/DNI   Family Communication: Plan discussed with David Atkins, daughter, at the bedside Disposition Plan: Plan to discharge home   Status is: Inpatient Remains inpatient appropriate because: Staph aureus bacteremia       Subjective:   Interval events noted.  He has no complaints.  He feels better.  David Atkins, daughter, was at the bedside  Objective:    Vitals:   04/09/24 0518 04/09/24 0522 04/09/24 1000 04/09/24 1652  BP:  137/72 132/84 (!) 129/93  Pulse:  63 69 61  Resp:  18 18 18   Temp:  97.9 F (36.6 C) 98 F (36.7 C) 98 F (36.7 C)  TempSrc:   Axillary Oral  SpO2:  96% 100% 98%  Weight: 76 kg     Height:       No data found.   Intake/Output Summary (Last 24 hours) at 04/09/2024 1706 Last data filed at 04/08/2024 2329 Gross per 24 hour  Intake --  Output 205 ml  Net -205 ml   Filed Weights   04/07/24 0500 04/08/24 0455 04/09/24 0518  Weight: 74.9 kg 75.9 kg 76 kg    Exam:  GEN: NAD SKIN: Warm and dry EYES: No pallor or icterus ENT: MMM CV: RRR PULM: CTA B ABD: soft, ND, NT, +BS CNS: AAO x 3, non focal EXT:  No edema or tenderness      Data Reviewed:   I have personally reviewed following labs and imaging studies:  Labs: Labs show the following:   Basic Metabolic Panel: Recent Labs  Lab 04/05/24 0126 04/05/24 0316 04/06/24 0313 04/07/24 0428 04/08/24 0556 04/09/24 0429  NA 141  --  143 142 140 141  K 4.1  --  3.2* 3.3* 3.4* 3.5  CL 106  --  110 108 108 110  CO2 27  --  25 25 24 24   GLUCOSE 145*  --  99 87 98 93  BUN 18  --  10 8 7* 7*  CREATININE 0.99  --  0.73 0.65 0.83 0.77  CALCIUM  8.9  --  8.2* 8.0* 8.3* 8.3*  MG  --  1.8 1.7 1.9  --  1.8   GFR Estimated Creatinine Clearance: 60.8 mL/min (by C-G formula based on SCr of 0.77 mg/dL). Liver Function Tests: Recent Labs  Lab 04/05/24 0126  AST 25  ALT 22  ALKPHOS 77  BILITOT 0.8  PROT 6.6  ALBUMIN 3.7   No results for input(s): "LIPASE", "AMYLASE" in the last 168 hours. No results for input(s): "AMMONIA" in the last 168 hours. Coagulation profile No results for input(s): "INR", "PROTIME" in the last 168 hours.  CBC: Recent Labs  Lab 04/05/24 0126 04/06/24 0313  WBC 10.7* 7.4  NEUTROABS 8.9*  --   HGB 13.7 12.8*  HCT 41.2 37.6*  MCV 100.5* 96.9  PLT 227 220   Cardiac Enzymes: No results for input(s): "CKTOTAL", "CKMB", "CKMBINDEX", "TROPONINI" in the last 168 hours. BNP (last 3 results) No results for input(s): "PROBNP" in the last 8760 hours. CBG: Recent Labs  Lab 04/06/24 2000  GLUCAP 123*   D-Dimer: No results for input(s): "DDIMER" in the last 72 hours. Hgb A1c: No results for input(s): "HGBA1C" in the last 72 hours. Lipid Profile: No results for input(s): "CHOL", "HDL", "LDLCALC", "TRIG", "CHOLHDL", "LDLDIRECT" in the last 72 hours. Thyroid  function studies: No results for input(s): "TSH", "T4TOTAL", "T3FREE", "THYROIDAB" in the last 72 hours.  Invalid input(s): "FREET3" Anemia work up: No results for input(s): "VITAMINB12", "FOLATE", "FERRITIN", "TIBC", "IRON", "RETICCTPCT" in the  last 72 hours. Sepsis Labs: Recent Labs  Lab 04/05/24 0126 04/05/24 0316 04/06/24 0313  WBC 10.7*  --  7.4  LATICACIDVEN 1.7 1.2  --     Microbiology Recent Results (from the past 240 hours)  Culture, blood (routine x 2)     Status: Abnormal   Collection Time: 04/05/24  1:26 AM   Specimen: BLOOD  Result Value Ref Range Status   Specimen Description   Final    BLOOD BLOOD LEFT ARM Performed at [facility], [address], [place], [place] [address]    Special Requests   Final    BOTTLES DRAWN AEROBIC AND ANAEROBIC Blood Culture adequate volume Performed at [facility], [address] [place], [place] [address]    Culture  Setup Time   Final    Organism ID to follow GRAM POSITIVE COCCI AEROBIC BOTTLE ONLY CRITICAL RESULT CALLED TO, READ BACK BY AND VERIFIED WITH: RAQUEL RODRIGUEZ @ 1738 04/05/24 BGH Performed at [facility], [address], [place], [place] [address]    Culture STAPHYLOCOCCUS AUREUS (A)  Final   Report Status 04/08/2024 FINAL  Final   Organism ID, Bacteria STAPHYLOCOCCUS AUREUS  Final      Susceptibility   Staphylococcus aureus - MIC*    CIPROFLOXACIN <=0.5 SENSITIVE Sensitive     ERYTHROMYCIN RESISTANT Resistant     GENTAMICIN <=0.5 SENSITIVE Sensitive     OXACILLIN <=0.25 SENSITIVE Sensitive     TETRACYCLINE <=1 SENSITIVE Sensitive     VANCOMYCIN  <=0.5 SENSITIVE Sensitive     TRIMETH/SULFA <=10 SENSITIVE Sensitive     CLINDAMYCIN RESISTANT Resistant     RIFAMPIN <=0.5 SENSITIVE Sensitive     Inducible Clindamycin POSITIVE  Resistant     LINEZOLID 2 SENSITIVE Sensitive     * STAPHYLOCOCCUS AUREUS  Culture, blood (routine x 2)     Status: None (Preliminary result)   Collection Time: 04/05/24  1:26 AM   Specimen: BLOOD  Result Value Ref Range Status   Specimen Description BLOOD BLOOD RIGHT ARM  Final   Special Requests   Final    BOTTLES DRAWN AEROBIC AND ANAEROBIC Blood Culture adequate volume   Culture   Final    NO  GROWTH 4 DAYS Performed at [facility], [address], [place], [place] [address]    Report Status PENDING  Incomplete  Resp panel by RT-PCR (RSV, Flu A&B, Covid) Anterior Nasal Swab     Status: None   Collection Time: 04/05/24  1:26 AM   Specimen: Anterior Nasal Swab  Result Value Ref Range Status   SARS Coronavirus 2 by RT PCR NEGATIVE NEGATIVE Final    Comment: (NOTE) SARS-CoV-2 target nucleic acids are NOT DETECTED.  The SARS-CoV-2 RNA is generally detectable in upper respiratory specimens during the acute phase of infection. The lowest concentration of SARS-CoV-2 viral copies this assay can detect is 138 copies/mL. A negative result does not preclude SARS-Cov-2 infection and should not be used as the sole basis for treatment or other patient management decisions. A negative result may occur with  improper specimen collection/handling, submission of specimen other than nasopharyngeal swab, presence of viral mutation(s) within the areas targeted by this assay, and inadequate number of viral copies(<138 copies/mL). A negative result must be combined with clinical observations, patient history, and epidemiological information. The expected result is Negative.  Fact Sheet for Patients:  BloggerCourse.com  Fact Sheet for Healthcare Providers:  SeriousBroker.it  This test is no t yet approved or cleared by the [place]  FDA and  has been authorized for detection and/or diagnosis of SARS-CoV-2 by FDA under an Emergency Use Authorization (EUA). This EUA will remain  in effect (meaning this test can be used) for the duration of the COVID-19 declaration under Section 564(b)(1) of the Act, 21 U.S.C.section 360bbb-3(b)(1), unless the authorization is terminated  or revoked sooner.       Influenza A by PCR NEGATIVE NEGATIVE Final   Influenza B by PCR NEGATIVE NEGATIVE Final    Comment: (NOTE) The Xpert Xpress  SARS-CoV-2/FLU/RSV plus assay is intended as an aid in the diagnosis of influenza from Nasopharyngeal swab specimens and should not be used as a sole basis for treatment. Nasal washings and aspirates are unacceptable for Xpert Xpress SARS-CoV-2/FLU/RSV testing.  Fact Sheet for Patients: BloggerCourse.com  Fact Sheet for Healthcare Providers: SeriousBroker.it  This test is not yet approved or cleared by the [place]  FDA and has been authorized for detection and/or diagnosis of SARS-CoV-2 by FDA under an Emergency Use Authorization (EUA). This EUA will remain in effect (meaning this test can be used) for the duration of the COVID-19 declaration under Section 564(b)(1) of the Act, 21 U.S.C. section 360bbb-3(b)(1), unless the authorization is terminated or revoked.     Resp Syncytial Virus by PCR NEGATIVE NEGATIVE Final    Comment: (NOTE) Fact Sheet for Patients: BloggerCourse.com  Fact Sheet for Healthcare Providers: SeriousBroker.it  This test is not yet approved or cleared by the [place]  FDA and has been authorized for detection and/or diagnosis of SARS-CoV-2 by FDA under an Emergency Use Authorization (EUA). This EUA will remain in effect (meaning this test can be used) for the duration of the COVID-19 declaration under  Section 564(b)(1) of the Act, 21 U.S.C. section 360bbb-3(b)(1), unless the authorization is terminated or revoked.  Performed at [facility], [address] [place], [place] [address]   Blood Culture ID Panel (Reflexed)     Status: Abnormal   Collection Time: 04/05/24  1:26 AM  Result Value Ref Range Status   Enterococcus faecalis NOT DETECTED NOT DETECTED Final   Enterococcus Faecium NOT DETECTED NOT DETECTED Final   Listeria monocytogenes NOT DETECTED NOT DETECTED Final   Staphylococcus species DETECTED (A) NOT DETECTED Final     Comment: CRITICAL RESULT CALLED TO, READ BACK BY AND VERIFIED WITH: RAQUEL RODRIGUEZ @1738  04/05/24 BG    Staphylococcus aureus (BCID) DETECTED (A) NOT DETECTED Final    Comment: CRITICAL RESULT CALLED TO, READ BACK BY AND VERIFIED WITH: RAQUEL RODRIGUEZ @ 1738 04/05/24 BGH    Staphylococcus epidermidis NOT DETECTED NOT DETECTED Final   Staphylococcus lugdunensis NOT DETECTED NOT DETECTED Final   Streptococcus species NOT DETECTED NOT DETECTED Final   Streptococcus agalactiae NOT DETECTED NOT DETECTED Final   Streptococcus pneumoniae NOT DETECTED NOT DETECTED Final   Streptococcus pyogenes NOT DETECTED NOT DETECTED Final   A.calcoaceticus-baumannii NOT DETECTED NOT DETECTED Final   Bacteroides fragilis NOT DETECTED NOT DETECTED Final   Enterobacterales NOT DETECTED NOT DETECTED Final   Enterobacter cloacae complex NOT DETECTED NOT DETECTED Final   Escherichia coli NOT DETECTED NOT DETECTED Final   Klebsiella aerogenes NOT DETECTED NOT DETECTED Final   Klebsiella oxytoca NOT DETECTED NOT DETECTED Final   Klebsiella pneumoniae NOT DETECTED NOT DETECTED Final   Proteus species NOT DETECTED NOT DETECTED Final   Salmonella species NOT DETECTED NOT DETECTED Final   Serratia marcescens NOT DETECTED NOT DETECTED Final   Haemophilus influenzae NOT DETECTED NOT DETECTED Final   Neisseria meningitidis NOT DETECTED NOT DETECTED Final   Pseudomonas aeruginosa NOT DETECTED NOT DETECTED Final   Stenotrophomonas maltophilia NOT DETECTED NOT DETECTED Final   Candida albicans NOT DETECTED NOT DETECTED Final   Candida auris NOT DETECTED NOT DETECTED Final   Candida glabrata NOT DETECTED NOT DETECTED Final   Candida krusei NOT DETECTED NOT DETECTED Final   Candida parapsilosis NOT DETECTED NOT DETECTED Final   Candida tropicalis NOT DETECTED NOT DETECTED Final   Cryptococcus neoformans/gattii NOT DETECTED NOT DETECTED Final   Meth resistant mecA/C and MREJ NOT DETECTED NOT DETECTED Final    Comment:  Performed at [facility], [address], [place], [place] [address]  Urine Culture     Status: Abnormal   Collection Time: 04/05/24  1:55 AM   Specimen: Urine, Random  Result Value Ref Range Status   Specimen Description   Final    URINE, RANDOM Performed at [facility], [address] [place], [place] [address]    Special Requests   Final    NONE Reflexed from [id number] Performed at [facility], [address] [place], [place] [address]    Culture (A)  Final    >=100,000 COLONIES/mL ESCHERICHIA COLI 30,000 COLONIES/mL KLEBSIELLA PNEUMONIAE    Report Status 04/07/2024 FINAL  Final   Organism ID, Bacteria ESCHERICHIA COLI (A)  Final   Organism ID, Bacteria KLEBSIELLA PNEUMONIAE (A)  Final      Susceptibility   Escherichia coli - MIC*    AMPICILLIN <=2 SENSITIVE Sensitive     CEFAZOLIN  <=4 SENSITIVE Sensitive     CEFEPIME  <=0.12 SENSITIVE Sensitive     CEFTRIAXONE <=0.25 SENSITIVE Sensitive     CIPROFLOXACIN <=0.25 SENSITIVE  Sensitive     GENTAMICIN <=1 SENSITIVE Sensitive     IMIPENEM 1 SENSITIVE Sensitive     NITROFURANTOIN <=16 SENSITIVE Sensitive     TRIMETH/SULFA <=20 SENSITIVE Sensitive     AMPICILLIN/SULBACTAM <=2 SENSITIVE Sensitive     PIP/TAZO <=4 SENSITIVE Sensitive ug/mL    * >=100,000 COLONIES/mL ESCHERICHIA COLI   Klebsiella pneumoniae - MIC*    AMPICILLIN >=32 RESISTANT Resistant     CEFAZOLIN  <=4 SENSITIVE Sensitive     CEFEPIME  <=0.12 SENSITIVE Sensitive     CEFTRIAXONE <=0.25 SENSITIVE Sensitive     CIPROFLOXACIN <=0.25 SENSITIVE Sensitive     GENTAMICIN <=1 SENSITIVE Sensitive     IMIPENEM <=0.25 SENSITIVE Sensitive     NITROFURANTOIN 128 RESISTANT Resistant     TRIMETH/SULFA <=20 SENSITIVE Sensitive     AMPICILLIN/SULBACTAM 4 SENSITIVE Sensitive     PIP/TAZO <=4 SENSITIVE Sensitive ug/mL    * 30,000 COLONIES/mL KLEBSIELLA PNEUMONIAE  Culture, blood (Routine X 2) w Reflex to ID Panel     Status: None (Preliminary result)    Collection Time: 04/06/24 11:03 PM   Specimen: BLOOD  Result Value Ref Range Status   Specimen Description BLOOD BLOOD RIGHT ARM  Final   Special Requests   Final    BOTTLES DRAWN AEROBIC AND ANAEROBIC Blood Culture adequate volume   Culture   Final    NO GROWTH 3 DAYS Performed at [facility], [address] [place], [place] [address]    Report Status PENDING  Incomplete  Culture, blood (Routine X 2) w Reflex to ID Panel     Status: None (Preliminary result)   Collection Time: 04/06/24 11:05 PM   Specimen: BLOOD  Result Value Ref Range Status   Specimen Description BLOOD BLOOD LEFT ARM  Final   Special Requests   Final    BOTTLES DRAWN AEROBIC AND ANAEROBIC Blood Culture adequate volume   Culture   Final    NO GROWTH 3 DAYS Performed at [facility], [address] [place], [place] [address]    Report Status PENDING  Incomplete  Aerobic Culture w Gram Stain (superficial specimen)     Status: None (Preliminary result)   Collection Time: 04/07/24  5:53 PM   Specimen: Wound  Result Value Ref Range Status   Specimen Description   Final    WOUND Performed at [facility], [address] [place], [place] [address]    Special Requests   Final    FOREARM Performed at [facility], [address] [place], [place] [address]    Gram Stain NO WBC SEEN FEW GRAM POSITIVE COCCI   Final   Culture   Final    ABUNDANT STAPHYLOCOCCUS AUREUS RARE ENTEROBACTER GERGOVIAE SUSCEPTIBILITIES TO FOLLOW Performed at [facility], [address] [place], [place] [address]    Report Status PENDING  Incomplete    Procedures and diagnostic studies:  No results found.             LOS: 3 days   Ouida Abeyta  [facility]   Pager on [email or web address]. If 7PM-7AM, please contact night-coverage at [email or web address]     04/09/2024, 5:06 PM

## 2024-04-09 NOTE — Progress Notes (Signed)
 PHARMACY CONSULT NOTE FOR:  OUTPATIENT  PARENTERAL ANTIBIOTIC THERAPY (OPAT)  Indication: MSSA bacteremia Regimen: Cefazolin  2gm IV q8h End date: 04/20/2024  Labs - Once weekly:  CBC/D and CMP Fax weekly lab results  promptly to [phone] Please pull PIC at completion of IV antibiotics Call [phone] with any questions  IV antibiotic discharge orders are pended. To discharging provider:  please sign these orders via discharge navigator,  Select New Orders & click on the button choice - Manage This Unsigned Work.     Thank you for allowing pharmacy to be a part of this patient's care.  Saabir Blyth, PharmD, BCPS, BCIDP Work Cell: [phone] 04/09/2024 4:49 PM

## 2024-04-09 NOTE — Progress Notes (Signed)
 Physical Therapy Treatment Patient Details Name: David Atkins MRN: [id number] DOB: 05-20-1929 Today's Date: 04/09/2024   History of Present Illness Pt is a 89 y.o. male presents to ED via EMS from home after a near syncopal episode, hypotensive. Admitted with MSSA bacteremia 2/2 unclear source. PMH of  dCHF, hypertension, hyperlipidemia, COPD, CAD s/p CABG, GERD, BPH, BPPV, CVA found incidentally on imaging no symptoms, nonepileptic seizure (not taking medications), GAD, IBS, and OSA on CPAP.    PT Comments  Patient received in bed, he is agreeable to get up, wants to eat breakfast in recliner. Patient is mod I with bed mobility. He requires cga for sit to stand transfer. Ambulated with RW 5 feet to recliner and set up with tray. Patient will continue to benefit from skilled PT to improve functional independence.         If plan is discharge home, recommend the following: A little help with walking and/or transfers;A little help with bathing/dressing/bathroom;Assistance with cooking/housework   Can travel by private vehicle      yes  Equipment Recommendations  None recommended by PT    Recommendations for Other Services       Precautions / Restrictions Precautions Precautions: Fall Recall of Precautions/Restrictions: Intact Restrictions Weight Bearing Restrictions Per Provider Order: No     Mobility  Bed Mobility Overal bed mobility: Modified Independent Bed Mobility: Supine to Sit     Supine to sit: Modified independent (Device/Increase time)          Transfers Overall transfer level: Needs assistance Equipment used: Rolling walker (2 wheels) Transfers: Sit to/from Stand Sit to Stand: Contact guard assist                Ambulation/Gait Ambulation/Gait assistance: Contact guard assist Gait Distance (Feet): 5 Feet Assistive device: Rolling walker (2 wheels) Gait Pattern/deviations: Step-through pattern Gait velocity: WFL     General Gait Details: patient  declined ambulating further at this time due to wanting to eat breakfast.   Stairs             Wheelchair Mobility     Tilt Bed    Modified Rankin (Stroke Patients Only)       Balance Overall balance assessment: Modified Independent Sitting-balance support: Feet supported Sitting balance-Leahy Scale: Good     Standing balance support: Bilateral upper extremity supported, During functional activity, Reliant on assistive device for balance Standing balance-Leahy Scale: Good                              Communication Communication Communication: No apparent difficulties  Cognition Arousal: Alert Behavior During Therapy: WFL for tasks assessed/performed   PT - Cognitive impairments: No apparent impairments                         Following commands: Intact      Cueing Cueing Techniques: Verbal cues  Exercises      General Comments        Pertinent Vitals/Pain Pain Assessment Pain Assessment: No/denies pain    Home Living                          Prior Function            PT Goals (current goals can now be found in the care plan section) Acute Rehab PT Goals Patient Stated Goal: " Get better and go home."  PT Goal Formulation: With patient Time For Goal Achievement: 04/20/24 Potential to Achieve Goals: Good Progress towards PT goals: Progressing toward goals    Frequency    Min 2X/week      PT Plan      Co-evaluation              AM-PAC PT "6 Clicks" Mobility   Outcome Measure  Help needed turning from your back to your side while in a flat bed without using bedrails?: None Help needed moving from lying on your back to sitting on the side of a flat bed without using bedrails?: None Help needed moving to and from a bed to a chair (including a wheelchair)?: A Little Help needed standing up from a chair using your arms (e.g., wheelchair or bedside chair)?: A Little Help needed to walk in hospital  room?: A Little Help needed climbing 3-5 steps with a railing? : A Little 6 Click Score: 20    End of Session   Activity Tolerance: Patient tolerated treatment well Patient left: in chair;with call bell/phone within reach Nurse Communication: Mobility status PT Visit Diagnosis: Muscle weakness (generalized) (M62.81)     Time: 5284-1324 PT Time Calculation (min) (ACUTE ONLY): 10 min  Charges:    $Therapeutic Activity: 8-22 mins PT General Charges $$ ACUTE PT VISIT: 1 Visit                     Heron Pitcock, PT, GCS 04/09/24,2:55 PM

## 2024-04-09 NOTE — Progress Notes (Signed)
 Mobility Specialist - Progress Note   04/09/24 1436  Mobility  Activity Ambulated with assistance in hallway;Ambulated with assistance in room;Ambulated with assistance to bathroom  Level of Assistance Contact guard assist, steadying assist  Assistive Device Front wheel walker  Distance Ambulated (ft) 160 ft  Activity Response Tolerated well  Mobility visit 1 Mobility  Mobility Specialist Start Time (ACUTE ONLY) 1142  Mobility Specialist Stop Time (ACUTE ONLY) 1201  Mobility Specialist Time Calculation (min) (ACUTE ONLY) 19 min   Pt sitting in the recliner upon entry, utilizing RA. Pt agreeable to amb within the hallway this date. Pt STS to RW and amb one lap around the NS CGA-MinG. Pt expressed his LLE feeling "weaker" than his RLE. Pt returned to the room, amb to/from the bathroom, left seated in the recliner with alarm set and needs within reach.  [place]a [place] Mobility Specialist 04/09/24 2:56 PM

## 2024-04-09 NOTE — Treatment Plan (Signed)
 Diagnosis: MSSA bacteremia Baseline Creatinine <1    Allergies  Allergen Reactions   Sulfa Antibiotics Rash    OPAT Orders Discharge antibiotics: Cefazolin  2 grams IV every 8 hours Duration: 2 weeks End Date: 04/20/24  [facility] Care Per Protocol:  Labs weekly while on IV antibiotics: _X_ CBC with differential  _X_ CMP   _X_ Please pull PIC at completion of IV antibiotics   Fax weekly lab results  promptly to [phone]  Clinic Follow Up Appt:Jakerra Floyd 04/17/24 at 10.45am   Call [phone] with any questions

## 2024-04-09 NOTE — Progress Notes (Signed)

## 2024-04-09 NOTE — Care Management Important Message (Signed)
 Important Message  Patient Details  Name: David Atkins MRN: [id number] Date of Birth: [birth year]   Important Message Given:  Yes - Medicare IM     [place] W, CMA 04/09/2024, 10:36 AM

## 2024-04-09 NOTE — Progress Notes (Signed)
 Date of Admission:  04/05/2024      ID: Montee Lindly is a 89 y.o. male  Principal Problem:   Near syncope Active Problems:   BPH (benign prostatic hyperplasia)   Hypertension   Hyperlipidemia LDL goal <70   BPPV (benign paroxysmal positional vertigo)   OSA on CPAP   CAD (coronary artery disease)   Chronic diastolic CHF (congestive heart failure) (HCC)   Urinary tract infection   MSSA bacteremia   Pressure injury of skin    Subjective: Doing well.  Medications:   amLODipine   2.5 mg Oral Daily   atorvastatin   20 mg Oral Daily   cholecalciferol   1,000 Units Oral Daily   cholestyramine   4 g Oral Daily   enoxaparin  (LOVENOX ) injection  40 mg Subcutaneous Q24H   finasteride   5 mg Oral Daily   lisinopril   20 mg Oral Daily   metoprolol  tartrate  12.5 mg Oral BID   oxybutynin   5 mg Oral BID   pantoprazole   40 mg Oral Daily   potassium chloride   20 mEq Oral Daily   sodium chloride  flush  10-40 mL Intracatheter Q12H    Objective: Vital signs in last 24 hours: Patient Vitals for the past 24 hrs:  BP Temp Temp src Pulse Resp SpO2 Weight  04/09/24 1652 (!) 129/93 98 F (36.7 C) Oral 61 18 98 % --  04/09/24 1000 132/84 98 F (36.7 C) Axillary 69 18 100 % --  04/09/24 0522 137/72 97.9 F (36.6 C) -- 63 18 96 % --  04/09/24 0518 -- -- -- -- -- -- 76 kg  04/08/24 2012 112/74 97.9 F (36.6 C) Oral 73 18 96 % --       PHYSICAL EXAM:  General: Alert, cooperative, no distress, appears stated age.  Lungs: Clear to auscultation bilaterally. No Wheezing or Rhonchi. No rales. Heart: Regular rate and rhythm, no murmur, rub or gallop. Abdomen: Soft, non-tender,not distended. Bowel sounds normal. No masses Extremities: Right forearm nodular ulcerating lesion with a dry scab Skin: No rashes or lesions. Or bruising Lymph: Cervical, supraclavicular normal. Neurologic: Grossly non-focal  Lab Results    Latest Ref Rng & Units 04/06/2024    3:13 AM 04/05/2024    1:26 AM  09/13/2023    7:25 PM  CBC  WBC 4.0 - 10.5 K/uL 7.4  10.7  8.2   Hemoglobin 13.0 - 17.0 g/dL 78.2  95.6  21.3   Hematocrit 39.0 - 52.0 % 37.6  41.2  45.4   Platelets 150 - 400 K/uL 220  227  218        Latest Ref Rng & Units 04/09/2024    4:29 AM 04/08/2024    5:56 AM 04/07/2024    4:28 AM  CMP  Glucose 70 - 99 mg/dL 93  98  87   BUN 8 - 23 mg/dL 7  7  8    Creatinine 0.61 - 1.24 mg/dL 0.86  5.78  4.69   Sodium 135 - 145 mmol/L 141  140  142   Potassium 3.5 - 5.1 mmol/L 3.5  3.4  3.3   Chloride 98 - 111 mmol/L 110  108  108   CO2 22 - 32 mmol/L 24  24  25    Calcium  8.9 - 10.3 mg/dL 8.3  8.3  8.0       Microbiology: 04/05/2024 blood culture 1 out of 2 sets Staph aureus Urine culture E. coli and Klebsiella pneumoniae both sensitive to cefazolin   04/07/2023 repeat  blood culture no growth so far  04/07/2024 right forearm wound culture abundant Staphylococcus aureus.  The Enterobacter is likely colonizing it.  So will not treat that 1      Assessment/Plan:  MSSA bacteremia source is the skin wound Patient is on cefazolin  2D echo not an optimal study Patient does not have a hardware or murmur because of his age and having a source for the bacteremia we will not pursue TEE.  Daughter is also not in favor of TEE Repeat Blood culture negative  Will do a total of 2 weeks of IV cefazolin .  Until 04/20/2024. Patient has  a midline.  Presyncope management as per primary team  E. coli and Klebsiella in urine culture contaminant versus a true infection Bladder scan not significant around 100 cc Both organisms susceptible to Ceftin will in which she is getting for the Staph aureus bacteremia  Ulcerating nodule right forearm.  Keratoacanthoma versus BCC versus SCC Secondarily infected with Staph aureus  History of CAD status post CABG  History of cholecystectomy   Discussed the management with the patient and his daughter. I will follow him as outpatient OPAT orders written.   Daughter will for education on IV administration at home ID will sign off now.  Call if needed.

## 2024-04-10 ENCOUNTER — Other Ambulatory Visit: Payer: Self-pay

## 2024-04-10 ENCOUNTER — Emergency Department
Admission: EM | Admit: 2024-04-10 | Discharge: 2024-04-11 | Disposition: A | Discharge status: Home / Self Care | Attending: Emergency Medicine | Admitting: Emergency Medicine

## 2024-04-10 DIAGNOSIS — R7881 Bacteremia: Secondary | ICD-10-CM | POA: Diagnosis not present

## 2024-04-10 DIAGNOSIS — R55 Syncope and collapse: Secondary | ICD-10-CM | POA: Diagnosis not present

## 2024-04-10 DIAGNOSIS — B9561 Methicillin susceptible Staphylococcus aureus infection as the cause of diseases classified elsewhere: Secondary | ICD-10-CM | POA: Diagnosis not present

## 2024-04-10 LAB — CBC
HCT: 40.2 % (ref 39.0–52.0)
Hemoglobin: 13.5 g/dL (ref 13.0–17.0)
MCH: 33.1 pg (ref 26.0–34.0)
MCHC: 33.6 g/dL (ref 30.0–36.0)
MCV: 98.5 fL (ref 80.0–100.0)
Platelets: 231 10*3/uL (ref 150–400)
RBC: 4.08 MIL/uL — ABNORMAL LOW (ref 4.22–5.81)
RDW: 12.5 % (ref 11.5–15.5)
WBC: 8.9 10*3/uL (ref 4.0–10.5)
nRBC: 0 % (ref 0.0–0.2)

## 2024-04-10 LAB — CULTURE, BLOOD (ROUTINE X 2)
Culture: NO GROWTH
Special Requests: ADEQUATE

## 2024-04-10 LAB — COMPREHENSIVE METABOLIC PANEL WITH GFR
ALT: 8 U/L (ref 0–44)
AST: 31 U/L (ref 15–41)
Albumin: 3.4 g/dL — ABNORMAL LOW (ref 3.5–5.0)
Alkaline Phosphatase: 63 U/L (ref 38–126)
Anion gap: 9 (ref 5–15)
BUN: 9 mg/dL (ref 8–23)
CO2: 25 mmol/L (ref 22–32)
Calcium: 9.2 mg/dL (ref 8.9–10.3)
Chloride: 108 mmol/L (ref 98–111)
Creatinine, Ser: 0.74 mg/dL (ref 0.61–1.24)
GFR, Estimated: >60 mL/min (ref >60)
Glucose, Bld: 116 mg/dL — ABNORMAL HIGH (ref 70–99)
Potassium: 4.1 mmol/L (ref 3.5–5.1)
Sodium: 142 mmol/L (ref 135–145)
Total Bilirubin: 0.5 mg/dL (ref 0.0–1.2)
Total Protein: 6.5 g/dL (ref 6.5–8.1)

## 2024-04-10 LAB — TROPONIN I (HIGH SENSITIVITY)
Troponin I (High Sensitivity): 7 ng/L (ref <18)
Troponin I (High Sensitivity): 7 ng/L (ref <18)

## 2024-04-10 MED ORDER — CEFAZOLIN SODIUM-DEXTROSE 2-4 GM/100ML-% IV SOLN
2.0000 g | INTRAVENOUS | Status: AC
  Administered 2024-04-11: 2 g via INTRAVENOUS
  Filled 2024-04-10: qty 100

## 2024-04-10 MED ORDER — POLYETHYLENE GLYCOL 3350 17 GM/SCOOP PO POWD: 17.0000 g | Freq: Every day | ORAL | Status: DC

## 2024-04-10 MED ORDER — CEFAZOLIN IV (FOR PTA / DISCHARGE USE ONLY): 2.0000 g | Freq: Three times a day (TID) | INTRAVENOUS | 0 refills | Status: DC

## 2024-04-10 NOTE — Progress Notes (Signed)
 Patient is alert and oriented X 4. Discharge instruction given to pt and her daughter [name]. He is going home with midline catheter for I/v abx.

## 2024-04-10 NOTE — ED Triage Notes (Addendum)
 Per daughter, he has bene in the hospital for 6 days due to a nonepileptic seizure and hypotension. He is being treated for a UTI and MSSA in his blood. He has a midline to his left upper arm and has been receiving Cefazolin  via home infusions. First home dose was today. His daughter reports she heparin  flushed it but then he went "into a catatonic" state, face became white/pale and unable to speak. This episode lasted approx 3-4 minutes. He reports he feels fine now.

## 2024-04-10 NOTE — ED Notes (Signed)
 First nurse note-pt brought in via [facility] from home.  Pt has a picc line, was given abx today by caregiver and pt went out briefly. No rash or sob. Pt has a DNR  pt alert, sitting in wheelchair.    BP 130/70 P-60 O2 sats 95% Cbg 146

## 2024-04-10 NOTE — Plan of Care (Signed)
  Problem: Health Behavior/Discharge Planning: Goal: Ability to manage health-related needs will improve Outcome: Progressing   Problem: Clinical Measurements: Goal: Will remain free from infection Outcome: Progressing Goal: Respiratory complications will improve Outcome: Progressing   Problem: Activity: Goal: Risk for activity intolerance will decrease Outcome: Progressing

## 2024-04-10 NOTE — ED Provider Notes (Signed)
 [facility] Provider Note    Event Date/Time   First MD Initiated Contact with Patient 04/10/24 2301     (approximate)   History   Near Syncope   HPI David Atkins is a 89 y.o. male with extensive chronic medical history who comes with DNR paperwork and who was just discharged from the hospital earlier today.  He was seen for syncope and his daughter who is at bedside and who is his primary caregiver states that he is passed out multiple times and has been seen by cardiologist and neurologist and no one can tell what is happening.  However during his last visit to the emergency department about 6 days ago when he was seen after passing out after a "fecal explosion", he was found to have a urinary tract infection and MSSA bacteremia.  He spent 6 days in the hospital and has a midline IV with plans to continue cefazolin  2 g IV every 8 hours for about 10 more days.  He is followed by Dr. Fawn Hooks.  Earlier tonight after getting home, his daughter tried to give him the first IV push of antibiotics.  When she did so, he reports that everything got dark and he felt like he was going to pass out.  He could not respond to her and she said that he was not responding to him.  This lasted a couple of minutes.  He then returned to baseline and has been completely normal for him since that time.  He said he feels weaker than when he went into the hospital and it is much harder for him to get around.  He denies fever, chest pain, shortness of breath, nausea, and vomiting.  He has no new numbness or weakness on either side of his body, just the generalized weakness.     Physical Exam   Triage Vital Signs: ED Triage Vitals  Encounter Vitals Group     BP 04/10/24 1649 (!) 142/72     Systolic BP Percentile --      Diastolic BP Percentile --      Pulse Rate 04/10/24 1649 62     Resp 04/10/24 1649 18     Temp 04/10/24 1649 98.3 F (36.8 C)     Temp Source 04/10/24 1649 Oral      SpO2 04/10/24 1649 99 %     Weight 04/10/24 1650 75.3 kg (166 lb)     Height 04/10/24 1650 1.778 m (5\' 10" )     Head Circumference --      Peak Flow --      Pain Score 04/10/24 1650 0     Pain Loc --      Pain Education --      Exclude from Growth Chart --     Most recent vital signs: Vitals:   04/11/24 0000 04/11/24 0030  BP: 132/69 135/87  Pulse: 73 73  Resp:  (!) 24  Temp:    SpO2: 100% 100%    General: Awake, alert, elderly but responsive, answers questions appropriately.  Seems to be in good spirits and is conversant. CV:  Good peripheral perfusion.  Regular rate and rhythm, normal heart sounds. Resp:  Normal effort. Speaking easily and comfortably, no accessory muscle usage nor intercostal retractions.  Lungs are clear to auscultation bilaterally. Abd:  No distention.  No tenderness to palpation of the abdomen. Other:  Midline IV in place from hospitalization for home infusion of medications.   ED Results / Procedures /  Treatments   Labs (all labs ordered are listed, but only abnormal results are displayed) Labs Reviewed  COMPREHENSIVE METABOLIC PANEL WITH GFR - Abnormal; Notable for the following components:      Result Value   Glucose, Bld 116 (*)    Albumin 3.4 (*)    All other components within normal limits  CBC - Abnormal; Notable for the following components:   RBC 4.08 (*)    All other components within normal limits  TROPONIN I (HIGH SENSITIVITY)  TROPONIN I (HIGH SENSITIVITY)     EKG  ED ECG REPORT I, Lynnda Sas, the attending physician, personally viewed and interpreted this ECG.  Date: 04/10/2024 EKG Time: 17: 10 Rate: 66 Rhythm: normal sinus rhythm with premature atrial complexes QRS Axis: normal Intervals: normal ST/T Wave abnormalities: Non-specific ST segment / T-wave changes, but no clear evidence of acute ischemia. Narrative Interpretation: no definitive evidence of acute ischemia; does not meet STEMI  criteria.    PROCEDURES:  Critical Care performed: No  .1-3 Lead EKG Interpretation  Performed by: Lynnda Sas, MD Authorized by: Lynnda Sas, MD     Interpretation: normal     ECG rate:  75   ECG rate assessment: normal     Rhythm: sinus rhythm     Ectopy: PAC     Conduction: normal       IMPRESSION / MDM / ASSESSMENT AND PLAN / ED COURSE  I reviewed the triage vital signs and the nursing notes.                              Differential diagnosis includes, but is not limited to, vasovagal episode, orthostatic syncope or near syncope, sepsis, allergic reaction, adverse reaction to IV push, electrolyte or metabolic abnormality, anemia, CVA, intracranial bleed.  Patient's presentation is most consistent with acute presentation with potential threat to life or bodily function.  Labs/studies ordered: High-sensitivity troponin x 2, EKG, CMP, CBC  Interventions/Medications given:  Medications  ceFAZolin  (ANCEF ) IVPB 2g/100 mL premix (2 g Intravenous New Bag/Given 04/11/24 0032)    (Note:  hospital course my include additional interventions and/or labs/studies not listed above.)  I reviewed the patient's medical record including the discharge summary written earlier today by the hospitalist, Dr. Leory Rands, confirming the evaluation, treatment, and diagnoses from this most recent hospitalization.  I also confirmed the cefazolin  dosing and Dr. Sherryle Don notes.  Fortunately the patient's evaluation in the emergency department is reassuring.  His vital signs are stable and have been stable throughout 8+ hours in the emergency department.  He does not meet SIRS/sepsis criteria.  High-sensitivity troponin is negative x 2, hemoglobin is stable, electrolytes are reassuring.  Mental status is good and although he is deconditioned from his recent hospitalization, he has no gross focal neurological deficits.  I suspect he had a vasovagal reaction to the IV push.  He has been asymptomatic  since that time.  I strongly considered hospitalization, but he was just discharged and there seems to be no acute or emergent medical condition from which he would benefit from hospitalization.  I explained that to his daughter who is understandably concerned but also seems reassured by the evaluation and the fact the patient is having no additional symptoms.  I have nothing for which I can keep him in the emergency department or the hospital, but I have ordered a dose of cefazolin  2 g IV since he has missed his dose while waiting  in the ED and his daughter is concerned about performing the IV push since that is what seemed to cause his symptoms earlier.  She has already spoken with Dr. Fawn Hooks earlier today and the plan is for him to be seen in ID clinic today.  I think that is very reasonable or appropriate I will send a message to Dr. Fawn Hooks through [facility] as well.  The patient is on the cardiac monitor to evaluate for evidence of arrhythmia and/or significant heart rate changes.  The plan is for discharge after antibiotic administration and close outpatient follow-up.  Clinical Course as of 04/11/24 0132  Fri Apr 11, 2024  0131 Patient stable after antibiotics.  Sent [facility] In Basket message to Dr. Francee Inch.  Proceeding with discharge as previously discussed. [CF]    Clinical Course User Index [CF] Lynnda Sas, MD     FINAL CLINICAL IMPRESSION(S) / ED DIAGNOSES   Final diagnoses:  Near syncope     Rx / DC Orders   ED Discharge Orders     None        Note:  This document was prepared using Dragon voice recognition software and may include unintentional dictation errors.   Lynnda Sas, MD 04/11/24 [phone]

## 2024-04-10 NOTE — TOC Progression Note (Addendum)
 Transition of Care [facility]) - Progression Note    Patient Details  Name: David Atkins MRN: [id number] Date of Birth: Jul 01, 1929  Transition of Care [facility] CM/SW Contact  Elsie Halo, RN Phone Number: 04/10/2024, 9:10 AM  Clinical Narrative:     Referral for [facility] PT/OT faxed to [facility] [phone] in [place]. TOC called [facility] [phone] and confirmed the order has been received.  Pam with [facility] confirmed that teaching has been completed with the patient's daughter [name] and they will have the abx at the patient's home for 2 PM dose today.   10:30 AM TOC received call from [place] [phone] at [facility]. They are unable to accept the patient due to insurance.   TOC outreached to [place] [phone] with [facility] who accepted the referral for [facility] PT/OT and RN.      Expected Discharge Plan and Services                                               Social Determinants of Health (SDOH) Interventions SDOH Screenings   Food Insecurity: No Food Insecurity (04/05/2024)  Housing: Unknown (04/05/2024)  Transportation Needs: No Transportation Needs (04/05/2024)  Utilities: Not At Risk (04/05/2024)  Alcohol Screen: Low Risk  (07/24/2023)  Depression (PHQ2-9): Medium Risk (10/26/2023)  Financial Resource Strain: Low Risk  (07/24/2023)  Physical Activity: Inactive (07/24/2023)  Social Connections: Socially Isolated (04/06/2024)  Stress: No Stress Concern Present (07/24/2023)  Tobacco Use: Medium Risk (04/05/2024)    Readmission Risk Interventions     No data to display

## 2024-04-10 NOTE — Discharge Summary (Signed)
 Physician Discharge Summary   Patient: Dollie Steeg MRN: [id number] DOB: 04/09/1929  Admit date:     04/05/2024  Discharge date: 04/10/24  Discharge Physician: Sheril Dines   PCP: Thersia Flax, MD   Recommendations at discharge:   Follow-up with PCP 1 week Follow-up with Dr. Tresa Frohlich with [facility] as scheduled on Tuesday, 04/15/2024 Follow-up with Dr. Francee Inch, ID specialist on 04/17/2024  Discharge Diagnoses: Principal Problem:   Near syncope Active Problems:   Chronic diastolic CHF (congestive heart failure) (HCC)   Hypertension   Hyperlipidemia LDL goal <70   CAD (coronary artery disease)   BPH (benign prostatic hyperplasia)   OSA on CPAP   BPPV (benign paroxysmal positional vertigo)   Urinary tract infection   MSSA bacteremia   Pressure injury of skin  Resolved Problems:   * No resolved hospital problems. *  Hospital Course:  Leeum Pelts is a 89 y.o. male  with medical history significant for  dCHF, hypertension, hyperlipidemia, COPD, CAD s/p CABG, GERD, BPH, BPPV, CVA found incidentally on imaging no symptoms, nonepileptic seizure (not taking medications), GAD, IBS, and OSA on CPAP.  He presented to the hospital after a near syncopal episode. Daughter reports in the past that he has had similar episodes of dizziness and incontinence of stool.  However, he was noted to be hypotensive (BP 95/59) the night prior to admission.     Patient was found to have UTI during ED evaluation, was admitted and started on empiric IV antibiotics.  Blood cultures subsequently positive for MSSA.  Infectious disease consulted.     Further hospital course and management as outlined below.   5/4 - urine culture +gram negative rods, reincubated for better growth   5/5 Ucx >100k CFU E coli, 30k Klebsiella pna. Pan sensitive.    5/6 Wound Cx from RUE lesion abundant staph aureus.   Assessment and Plan:    MSSA Bacteremia, right forearm ulcerated nodule UTI is  GNR.  Right forearm wound Cx abundant staph aureus yesterday.  Blood culture showed MSSA --On Ancef , de-escalated from Rocephin --Repeat Bcx NGTD --2D Echo - no vegetations seen --ID recommended IV Ancef  through 04/20/2024 -- No plan for TEE because of advanced age and also source of bacteremia is likely from ulcerated nodule on right forearm. Midline catheter ordered today.     Urinary Tract Infection 5/4 - Urine cx +GNR's, >100k CFU E coli, 30K CFU klebsiella pneumoniae --Continue Ancef      Near Syncope  Occurred in setting of low BP at home, possibly in setting of his infections v dehydration v combination.  History of similar episodes intermittently, daughter will push electrolyte fluids and typically he improves quickly. Troponin: 7-->7 CT Head: NEGATIVE Transthoracic ECHO: EF 55-60%, mild LVH, no significant valvular disease Orthostatic vital signs negative on 04/08/2024     Pruritus - possibly related to bile acids, no associated rash. Pt and family had described severe diffuse itching of his upper body.  This has since improved since starting cholestyramine .      Post-cholecystectomy diarrhea-Improved  --Continue cholestyramine      Hypokalemia- Improved     Chronic diastolic CHF - compensated.  Echo this admission EF 55-60% and normal diastolic function, mild LVH - on metoprolol , not on diuretics     Comorbidities include hypertension, CAD, BPH, OSA on CPAP     His condition has improved and he is deemed stable for discharge to home today.  Discharge plan was discussed with Casandra Claw, daughter, in the hallway.  Consultants: ID specialist, cardiologist Procedures performed: TEE Disposition: Home health Diet recommendation:  Discharge Diet Orders (From admission, onward)     Start     Ordered   04/10/24 0000  Diet - low sodium heart healthy        04/10/24 0950           Cardiac diet DISCHARGE MEDICATION: Allergies as of 04/10/2024       Reactions    Sulfa Antibiotics Rash        Medication List     STOP taking these medications    Gemtesa 75 MG Tabs Generic drug: Vibegron   tamsulosin  0.4 MG Caps capsule Commonly known as: FLOMAX        TAKE these medications    amLODipine  2.5 MG tablet Commonly known as: NORVASC  TAKE 1 TABLET BY MOUTH DAILY   aspirin  EC 81 MG tablet Take 81 mg by mouth daily.   atorvastatin  20 MG tablet Commonly known as: LIPITOR TAKE 1 TABLET BY MOUTH DAILY   Blood Pressure Monitor Automat Devi Use daily to check blood pressure   ceFAZolin  IVPB Commonly known as: ANCEF  Inject 2 g into the vein every 8 (eight) hours for 10 days. Indication:  MSSA bacteremia First Dose: Yes Last Day of Therapy:  04/20/2024 Labs - Once weekly:  CBC/D and CMP Fax weekly lab results  promptly to [phone] Method of administration: IV Push Method of administration may be changed at the discretion of home infusion pharmacist based upon assessment of the patient and/or caregiver's ability to self-administer the medication ordered. Please pull PIC at completion of IV antibiotics Call [phone] with any questions   cholecalciferol  25 MCG (1000 UNIT) tablet Commonly known as: VITAMIN D3 Take 1,000 Units by mouth daily.   cholestyramine  4 GM/DOSE powder Commonly known as: QUESTRAN  Take 0.5 packets (2 g total) by mouth 3 (three) times daily with meals.   finasteride  5 MG tablet Commonly known as: PROSCAR  TAKE 1 TABLET BY MOUTH DAILY   Klor-Con  M20 20 MEQ tablet Generic drug: potassium chloride  SA TAKE 1 TABLET (20 MEQ TOTAL) BY MOUTH 2 (TWO) TIMES DAILY. WITH FOOD   lidocaine  5 % Commonly known as: LIDODERM  Place 1 patch onto the skin daily. Remove & Discard patch within 12 hours or as directed by MD What changed: Another medication with the same name was removed. Continue taking this medication, and follow the directions you see here.   lisinopril  20 MG tablet Commonly known as: ZESTRIL  TAKE 1  TABLET BY MOUTH DAILY   metoprolol  tartrate 25 MG tablet Commonly known as: LOPRESSOR  TAKE ONE-HALF TABLET BY  MOUTH TWICE DAILY   nitroGLYCERIN  0.4 MG SL tablet Commonly known as: Nitrostat  Place 1 tablet (0.4 mg total) under the tongue every 5 (five) minutes as needed for chest pain. Maximum of 3 doses.   omeprazole  20 MG capsule Commonly known as: PRILOSEC TAKE 2 CAPSULES BY MOUTH DAILY   oxybutynin  5 MG tablet Commonly known as: DITROPAN  TAKE 1 TABLET BY MOUTH TWICE  DAILY   polyethylene glycol powder 17 GM/SCOOP powder Commonly known as: GLYCOLAX /MIRALAX  Take 17 g by mouth at bedtime. Dissolve 1 capful of power into any liquid and drink once daily   rOPINIRole  0.25 MG tablet Commonly known as: REQUIP  TAKE 1 TABLET BY MOUTH AT  BEDTIME   silodosin 4 MG Caps capsule Commonly known as: RAPAFLO Take 4 mg by mouth daily.   Simethicone  Extra Strength 125 MG Caps Take 125 mg by mouth as needed (  gas).   SYRINGE 3CC/25GX1" 25G X 1" 3 ML Misc Use for b12 injections   TIGER BALM ARTHRITIS RUB EX Apply 1 Application topically as needed.   triamcinolone  cream 0.1 % Commonly known as: KENALOG  Apply 1 Application topically 2 (two) times daily. Until itching and rash resolve               Discharge Care Instructions  (From admission, onward)           Start     Ordered   04/10/24 0000  Change dressing on IV access line weekly and PRN  (Home infusion instructions - [facility] )        04/10/24 0950   04/10/24 0000  Discharge wound care:       Comments: Cover buttock wound with Mepilex border   04/10/24 0950            Follow-up Information     Thersia Flax, MD Follow up.   Specialty: Internal Medicine Why: Hospital follow up Contact information: [address] [place] [address] [phone]                Discharge Exam: Cleavon Curls Weights   04/08/24 0455 04/09/24 0518 04/10/24 0341  Weight: 75.9 kg 76 kg 75.7  kg   GEN: NAD SKIN: Warm and dry.  Ulcerated nodule on right distal forearm EYES: No pallor or icterus ENT: MMM CV: RRR PULM: CTA B ABD: soft, ND, NT, +BS CNS: AAO x 3, non focal EXT: No edema or tenderness   Condition at discharge: good  The results of significant diagnostics from this hospitalization (including imaging, microbiology, ancillary and laboratory) are listed below for reference.   Imaging Studies: ECHOCARDIOGRAM COMPLETE Result Date: 04/07/2024    ECHOCARDIOGRAM REPORT   Patient Name:   DOMONICK FILTER Date of Exam: 04/07/2024 Medical Rec #:  [id number]      Height:       70.0 in Accession #:    [id number]     Weight:       165.1 lb Date of Birth:  May 03, 1929       BSA:          1.924 m Patient Age:    89 years       BP:           158/81 mmHg Patient Gender: M              HR:           72 bpm. Exam Location:  [facility] Procedure: 2D Echo, Cardiac Doppler and Color Doppler (Both Spectral and Color            Flow Doppler were utilized during procedure). Indications:     Syncope R55  History:         Patient has prior history of Echocardiogram examinations, most                  recent 09/27/2022. Prior CABG; Risk Factors:Hypertension.  Sonographer:     Broadus Canes Referring Phys:  [id number] KATY L FOUST Diagnosing Phys: Lida Reeks Alluri IMPRESSIONS  1. Left ventricular ejection fraction, by estimation, is 55 to 60%. The left ventricle has normal function. The left ventricle has no regional wall motion abnormalities. There is mild left ventricular hypertrophy. Left ventricular diastolic parameters were normal.  2. Right ventricular systolic function is normal. The right ventricular size is normal.  3. The mitral valve is normal in structure. Trivial mitral  valve regurgitation.  4. The aortic valve was not well visualized. Aortic valve regurgitation is not visualized. Aortic valve sclerosis is present, with no evidence of aortic valve stenosis. FINDINGS  Left Ventricle: Left ventricular ejection  fraction, by estimation, is 55 to 60%. The left ventricle has normal function. The left ventricle has no regional wall motion abnormalities. The left ventricular internal cavity size was normal in size. There is  mild left ventricular hypertrophy. Left ventricular diastolic parameters were normal. Right Ventricle: The right ventricular size is normal. No increase in right ventricular wall thickness. Right ventricular systolic function is normal. Left Atrium: Left atrial size was normal in size. Right Atrium: Right atrial size was normal in size. Pericardium: There is no evidence of pericardial effusion. Mitral Valve: The mitral valve is normal in structure. Trivial mitral valve regurgitation. MV peak gradient, 5.8 mmHg. The mean mitral valve gradient is 2.0 mmHg. Tricuspid Valve: The tricuspid valve is not well visualized. Tricuspid valve regurgitation is trivial. Aortic Valve: The aortic valve was not well visualized. Aortic valve regurgitation is not visualized. Aortic valve sclerosis is present, with no evidence of aortic valve stenosis. Aortic valve mean gradient measures 3.5 mmHg. Aortic valve peak gradient measures 6.2 mmHg. Aortic valve area, by VTI measures 2.00 cm. Pulmonic Valve: The pulmonic valve was not well visualized. Pulmonic valve regurgitation is not visualized. Aorta: The aortic root is normal in size and structure. Venous: The inferior vena cava was not well visualized. IAS/Shunts: The atrial septum is grossly normal.  LEFT VENTRICLE PLAX 2D LVIDd:         4.00 cm   Diastology LVIDs:         2.90 cm   LV e' medial:    6.42 cm/s LV PW:         0.70 cm   LV E/e' medial:  10.9 LV IVS:        1.40 cm   LV e' lateral:   9.79 cm/s LVOT diam:     2.00 cm   LV E/e' lateral: 7.1 LV SV:         55 LV SV Index:   29 LVOT Area:     3.14 cm  RIGHT VENTRICLE RV Basal diam:  3.10 cm RV Mid diam:    2.10 cm RV S prime:     10.00 cm/s TAPSE (M-mode): 1.8 cm LEFT ATRIUM             Index        RIGHT ATRIUM            Index LA diam:        2.80 cm 1.46 cm/m   RA Area:     14.70 cm LA Vol (A2C):   28.7 ml 14.92 ml/m  RA Volume:   35.50 ml  18.45 ml/m LA Vol (A4C):   37.7 ml 19.59 ml/m LA Biplane Vol: 34.1 ml 17.72 ml/m  AORTIC VALVE AV Area (Vmax):    1.91 cm AV Area (Vmean):   1.92 cm AV Area (VTI):     2.00 cm AV Vmax:           125.00 cm/s AV Vmean:          84.200 cm/s AV VTI:            0.276 m AV Peak Grad:      6.2 mmHg AV Mean Grad:      3.5 mmHg LVOT Vmax:  75.80 cm/s LVOT Vmean:        51.400 cm/s LVOT VTI:          0.175 m LVOT/AV VTI ratio: 0.64  AORTA Ao Root diam: 3.10 cm MITRAL VALVE                TRICUSPID VALVE MV Area (PHT): 3.93 cm     TR Peak grad:   17.1 mmHg MV Area VTI:   2.45 cm     TR Vmax:        207.00 cm/s MV Peak grad:  5.8 mmHg MV Mean grad:  2.0 mmHg     SHUNTS MV Vmax:       1.20 m/s     Systemic VTI:  0.18 m MV Vmean:      65.4 cm/s    Systemic Diam: 2.00 cm MV Decel Time: 193 msec MV E velocity: 69.70 cm/s MV A velocity: 112.00 cm/s MV E/A ratio:  0.62 Joetta Mustache Electronically signed by Joetta Mustache Signature Date/Time: 04/07/2024/12:22:13 PM    Final    CT Head Wo Contrast Result Date: 04/05/2024 CLINICAL DATA:  Mental status change, unknown cause EXAM: CT HEAD WITHOUT CONTRAST TECHNIQUE: Contiguous axial images were obtained from the base of the skull through the vertex without intravenous contrast. RADIATION DOSE REDUCTION: This exam was performed according to the departmental dose-optimization program which includes automated exposure control, adjustment of the mA and/or kV according to patient size and/or use of iterative reconstruction technique. COMPARISON:  CT head 05/05/2023. FINDINGS: Brain: No evidence of acute infarction, hemorrhage, hydrocephalus, extra-axial collection or mass lesion/mass effect. Similar patchy white matter hypodensities, compatible with chronic microvascular ischemic disease. Vascular: Calcific atherosclerosis. Skull: No acute  fracture. Sinuses/Orbits: Paranasal sinus mucosal thickening. No acute orbital findings. Other: No mastoid effusions. IMPRESSION: No evidence of acute intracranial abnormality. Electronically Signed   By: Stevenson Elbe M.D.   On: 04/05/2024 01:03   DG Chest Port 1 View Result Date: 04/05/2024 CLINICAL DATA:  Altered mental status.  Syncopal episode. EXAM: PORTABLE CHEST 1 VIEW COMPARISON:  06/04/2023. FINDINGS: Remote right rib fracture. No consolidation. No visible pleural effusions or pneumothorax. Median sternotomy. IMPRESSION: No active disease. Electronically Signed   By: Stevenson Elbe M.D.   On: 04/05/2024 01:01    Microbiology: Results for orders placed or performed during the hospital encounter of 04/05/24  Culture, blood (routine x 2)     Status: Abnormal   Collection Time: 04/05/24  1:26 AM   Specimen: BLOOD  Result Value Ref Range Status   Specimen Description   Final    BLOOD BLOOD LEFT ARM Performed at [facility], [address] [place], [place] [address]    Special Requests   Final    BOTTLES DRAWN AEROBIC AND ANAEROBIC Blood Culture adequate volume Performed at [facility], [address] [place], [place] [address]    Culture  Setup Time   Final    Organism ID to follow GRAM POSITIVE COCCI AEROBIC BOTTLE ONLY CRITICAL RESULT CALLED TO, READ BACK BY AND VERIFIED WITH: RAQUEL RODRIGUEZ @ 1738 04/05/24 [facility] Performed at [facility], [address] [place], [place] [address]    Culture STAPHYLOCOCCUS AUREUS (A)  Final   Report Status 04/08/2024 FINAL  Final   Organism ID, Bacteria STAPHYLOCOCCUS AUREUS  Final      Susceptibility   Staphylococcus aureus - MIC*    CIPROFLOXACIN <=0.5 SENSITIVE Sensitive     ERYTHROMYCIN RESISTANT Resistant  GENTAMICIN <=0.5 SENSITIVE Sensitive     OXACILLIN <=0.25 SENSITIVE Sensitive     TETRACYCLINE <=1 SENSITIVE Sensitive     VANCOMYCIN  <=0.5 SENSITIVE Sensitive     TRIMETH/SULFA <=10 SENSITIVE  Sensitive     CLINDAMYCIN RESISTANT Resistant     RIFAMPIN <=0.5 SENSITIVE Sensitive     Inducible Clindamycin POSITIVE Resistant     LINEZOLID 2 SENSITIVE Sensitive     * STAPHYLOCOCCUS AUREUS  Culture, blood (routine x 2)     Status: None   Collection Time: 04/05/24  1:26 AM   Specimen: BLOOD  Result Value Ref Range Status   Specimen Description BLOOD BLOOD RIGHT ARM  Final   Special Requests   Final    BOTTLES DRAWN AEROBIC AND ANAEROBIC Blood Culture adequate volume   Culture   Final    NO GROWTH 5 DAYS Performed at [facility], [address] [place], [place] [address]    Report Status 04/10/2024 FINAL  Final  Resp panel by RT-PCR (RSV, Flu A&B, Covid) Anterior Nasal Swab     Status: None   Collection Time: 04/05/24  1:26 AM   Specimen: Anterior Nasal Swab  Result Value Ref Range Status   SARS Coronavirus 2 by RT PCR NEGATIVE NEGATIVE Final    Comment: (NOTE) SARS-CoV-2 target nucleic acids are NOT DETECTED.  The SARS-CoV-2 RNA is generally detectable in upper respiratory specimens during the acute phase of infection. The lowest concentration of SARS-CoV-2 viral copies this assay can detect is 138 copies/mL. A negative result does not preclude SARS-Cov-2 infection and should not be used as the sole basis for treatment or other patient management decisions. A negative result may occur with  improper specimen collection/handling, submission of specimen other than nasopharyngeal swab, presence of viral mutation(s) within the areas targeted by this assay, and inadequate number of viral copies(<138 copies/mL). A negative result must be combined with clinical observations, patient history, and epidemiological information. The expected result is Negative.  Fact Sheet for Patients:  BloggerCourse.com  Fact Sheet for Healthcare Providers:  SeriousBroker.it  This test is no t yet approved or cleared by the [place] FDA and  has been authorized for detection and/or diagnosis of SARS-CoV-2 by FDA under an Emergency Use Authorization (EUA). This EUA will remain  in effect (meaning this test can be used) for the duration of the COVID-19 declaration under Section 564(b)(1) of the Act, 21 U.S.C.section 360bbb-3(b)(1), unless the authorization is terminated  or revoked sooner.       Influenza A by PCR NEGATIVE NEGATIVE Final   Influenza B by PCR NEGATIVE NEGATIVE Final    Comment: (NOTE) The Xpert Xpress SARS-CoV-2/FLU/RSV plus assay is intended as an aid in the diagnosis of influenza from Nasopharyngeal swab specimens and should not be used as a sole basis for treatment. Nasal washings and aspirates are unacceptable for Xpert Xpress SARS-CoV-2/FLU/RSV testing.  Fact Sheet for Patients: BloggerCourse.com  Fact Sheet for Healthcare Providers: SeriousBroker.it  This test is not yet approved or cleared by the [place]  FDA and has been authorized for detection and/or diagnosis of SARS-CoV-2 by FDA under an Emergency Use Authorization (EUA). This EUA will remain in effect (meaning this test can be used) for the duration of the COVID-19 declaration under Section 564(b)(1) of the Act, 21 U.S.C. section 360bbb-3(b)(1), unless the authorization is terminated or revoked.     Resp Syncytial Virus by PCR NEGATIVE NEGATIVE Final    Comment: (NOTE) Fact Sheet for Patients: BloggerCourse.com  Fact  Sheet for Healthcare Providers: SeriousBroker.it  This test is not yet approved or cleared by the [place]  FDA and has been authorized for detection and/or diagnosis of SARS-CoV-2 by FDA under an Emergency Use Authorization (EUA). This EUA will remain in effect (meaning this test can be used) for the duration of the COVID-19 declaration under Section 564(b)(1) of the Act, 21 U.S.C. section  360bbb-3(b)(1), unless the authorization is terminated or revoked.  Performed at [facility], [address] [place], [place] [address]   Blood Culture ID Panel (Reflexed)     Status: Abnormal   Collection Time: 04/05/24  1:26 AM  Result Value Ref Range Status   Enterococcus faecalis NOT DETECTED NOT DETECTED Final   Enterococcus Faecium NOT DETECTED NOT DETECTED Final   Listeria monocytogenes NOT DETECTED NOT DETECTED Final   Staphylococcus species DETECTED (A) NOT DETECTED Final    Comment: CRITICAL RESULT CALLED TO, READ BACK BY AND VERIFIED WITH: RAQUEL RODRIGUEZ @1738  04/05/24 [facility]    Staphylococcus aureus (BCID) DETECTED (A) NOT DETECTED Final    Comment: CRITICAL RESULT CALLED TO, READ BACK BY AND VERIFIED WITH: RAQUEL RODRIGUEZ @ 1738 04/05/24 [facility]    Staphylococcus epidermidis NOT DETECTED NOT DETECTED Final   Staphylococcus lugdunensis NOT DETECTED NOT DETECTED Final   Streptococcus species NOT DETECTED NOT DETECTED Final   Streptococcus agalactiae NOT DETECTED NOT DETECTED Final   Streptococcus pneumoniae NOT DETECTED NOT DETECTED Final   Streptococcus pyogenes NOT DETECTED NOT DETECTED Final   A.calcoaceticus-baumannii NOT DETECTED NOT DETECTED Final   Bacteroides fragilis NOT DETECTED NOT DETECTED Final   Enterobacterales NOT DETECTED NOT DETECTED Final   Enterobacter cloacae complex NOT DETECTED NOT DETECTED Final   Escherichia coli NOT DETECTED NOT DETECTED Final   Klebsiella aerogenes NOT DETECTED NOT DETECTED Final   Klebsiella oxytoca NOT DETECTED NOT DETECTED Final   Klebsiella pneumoniae NOT DETECTED NOT DETECTED Final   Proteus species NOT DETECTED NOT DETECTED Final   Salmonella species NOT DETECTED NOT DETECTED Final   Serratia marcescens NOT DETECTED NOT DETECTED Final   Haemophilus influenzae NOT DETECTED NOT DETECTED Final   Neisseria meningitidis NOT DETECTED NOT DETECTED Final   Pseudomonas aeruginosa NOT DETECTED NOT DETECTED Final    Stenotrophomonas maltophilia NOT DETECTED NOT DETECTED Final   Candida albicans NOT DETECTED NOT DETECTED Final   Candida auris NOT DETECTED NOT DETECTED Final   Candida glabrata NOT DETECTED NOT DETECTED Final   Candida krusei NOT DETECTED NOT DETECTED Final   Candida parapsilosis NOT DETECTED NOT DETECTED Final   Candida tropicalis NOT DETECTED NOT DETECTED Final   Cryptococcus neoformans/gattii NOT DETECTED NOT DETECTED Final   Meth resistant mecA/C and MREJ NOT DETECTED NOT DETECTED Final    Comment: Performed at [facility], [address] [place], [place] [address]  Urine Culture     Status: Abnormal   Collection Time: 04/05/24  1:55 AM   Specimen: Urine, Random  Result Value Ref Range Status   Specimen Description   Final    URINE, RANDOM Performed at [facility], [address] [place], [place] [address]    Special Requests   Final    NONE Reflexed from [id number] Performed at [facility], [address] [place], [place] [address]    Culture (A)  Final    >=100,000 COLONIES/mL ESCHERICHIA COLI 30,000 COLONIES/mL KLEBSIELLA PNEUMONIAE    Report Status 04/07/2024 FINAL  Final   Organism ID, Bacteria ESCHERICHIA COLI (A)  Final   Organism  ID, Bacteria KLEBSIELLA PNEUMONIAE (A)  Final      Susceptibility   Escherichia coli - MIC*    AMPICILLIN <=2 SENSITIVE Sensitive     CEFAZOLIN  <=4 SENSITIVE Sensitive     CEFEPIME  <=0.12 SENSITIVE Sensitive     CEFTRIAXONE <=0.25 SENSITIVE Sensitive     CIPROFLOXACIN <=0.25 SENSITIVE Sensitive     GENTAMICIN <=1 SENSITIVE Sensitive     IMIPENEM 1 SENSITIVE Sensitive     NITROFURANTOIN <=16 SENSITIVE Sensitive     TRIMETH/SULFA <=20 SENSITIVE Sensitive     AMPICILLIN/SULBACTAM <=2 SENSITIVE Sensitive     PIP/TAZO <=4 SENSITIVE Sensitive ug/mL    * >=100,000 COLONIES/mL ESCHERICHIA COLI   Klebsiella pneumoniae - MIC*    AMPICILLIN >=32 RESISTANT Resistant     CEFAZOLIN  <=4 SENSITIVE Sensitive      CEFEPIME  <=0.12 SENSITIVE Sensitive     CEFTRIAXONE <=0.25 SENSITIVE Sensitive     CIPROFLOXACIN <=0.25 SENSITIVE Sensitive     GENTAMICIN <=1 SENSITIVE Sensitive     IMIPENEM <=0.25 SENSITIVE Sensitive     NITROFURANTOIN 128 RESISTANT Resistant     TRIMETH/SULFA <=20 SENSITIVE Sensitive     AMPICILLIN/SULBACTAM 4 SENSITIVE Sensitive     PIP/TAZO <=4 SENSITIVE Sensitive ug/mL    * 30,000 COLONIES/mL KLEBSIELLA PNEUMONIAE  Culture, blood (Routine X 2) w Reflex to ID Panel     Status: None (Preliminary result)   Collection Time: 04/06/24 11:03 PM   Specimen: BLOOD  Result Value Ref Range Status   Specimen Description BLOOD BLOOD RIGHT ARM  Final   Special Requests   Final    BOTTLES DRAWN AEROBIC AND ANAEROBIC Blood Culture adequate volume   Culture   Final    NO GROWTH 4 DAYS Performed at [facility], [address], [place], [place] [address]    Report Status PENDING  Incomplete  Culture, blood (Routine X 2) w Reflex to ID Panel     Status: None (Preliminary result)   Collection Time: 04/06/24 11:05 PM   Specimen: BLOOD  Result Value Ref Range Status   Specimen Description BLOOD BLOOD LEFT ARM  Final   Special Requests   Final    BOTTLES DRAWN AEROBIC AND ANAEROBIC Blood Culture adequate volume   Culture   Final    NO GROWTH 4 DAYS Performed at [facility], [address] [place], [place] [address]    Report Status PENDING  Incomplete  Aerobic Culture w Gram Stain (superficial specimen)     Status: None (Preliminary result)   Collection Time: 04/07/24  5:53 PM   Specimen: Wound  Result Value Ref Range Status   Specimen Description   Final    WOUND Performed at [facility], [address], [place], [place] [address]    Special Requests   Final    FOREARM Performed at [facility], [address] [place], [place] [address]    Gram Stain NO WBC SEEN FEW GRAM POSITIVE COCCI   Final   Culture   Final    ABUNDANT STAPHYLOCOCCUS  AUREUS RARE ENTEROBACTER GERGOVIAE SUSCEPTIBILITIES TO FOLLOW Performed at [facility], [address] [place], [place] [address]    Report Status PENDING  Incomplete    Labs: CBC: Recent Labs  Lab 04/05/24 0126 04/06/24 0313  WBC 10.7* 7.4  NEUTROABS 8.9*  --   HGB 13.7 12.8*  HCT 41.2 37.6*  MCV 100.5* 96.9  PLT 227 220   Basic Metabolic Panel: Recent Labs  Lab 04/05/24 0126  04/05/24 0316 04/06/24 0313 04/07/24 0428 04/08/24 0556 04/09/24 0429  NA 141  --  143 142 140 141  K 4.1  --  3.2* 3.3* 3.4* 3.5  CL 106  --  110 108 108 110  CO2 27  --  25 25 24 24   GLUCOSE 145*  --  99 87 98 93  BUN 18  --  10 8 7* 7*  CREATININE 0.99  --  0.73 0.65 0.83 0.77  CALCIUM  8.9  --  8.2* 8.0* 8.3* 8.3*  MG  --  1.8 1.7 1.9  --  1.8   Liver Function Tests: Recent Labs  Lab 04/05/24 0126  AST 25  ALT 22  ALKPHOS 77  BILITOT 0.8  PROT 6.6  ALBUMIN 3.7   CBG: Recent Labs  Lab 04/06/24 2000  GLUCAP 123*    Discharge time spent: greater than 30 minutes.  Signed: Sheril Dines, MD [facility] 04/10/2024

## 2024-04-11 ENCOUNTER — Telehealth: Payer: Self-pay | Admitting: Infectious Diseases

## 2024-04-11 LAB — AEROBIC CULTURE W GRAM STAIN (SUPERFICIAL SPECIMEN): Gram Stain: NONE SEEN

## 2024-04-11 LAB — CULTURE, BLOOD (ROUTINE X 2)
Culture: NO GROWTH
Culture: NO GROWTH
Special Requests: ADEQUATE
Special Requests: ADEQUATE

## 2024-04-11 NOTE — Transitions of Care (Post Inpatient/ED Visit) (Signed)
   04/11/2024  Name: David Atkins MRN: [id number] DOB: 1928/12/06  Today's TOC FU Call Status: Today's TOC FU Call Status:: Unsuccessful Call (1st Attempt) Unsuccessful Call (1st Attempt) Date: 04/11/24  Attempted to reach the patient regarding the most recent Inpatient/ED visit.  Follow Up Plan: Additional outreach attempts will be made to reach the patient to complete the Transitions of Care (Post Inpatient/ED visit) call.   Gareld June, BSN, RN [place]  VBCI - [employer] [facility] RN Care Manager [phone]

## 2024-04-11 NOTE — Discharge Instructions (Signed)
 Your workup in the Emergency Department today was reassuring.  We did not find any specific abnormalities.  We recommend you drink plenty of fluids, take your regular medications and/or any new ones prescribed today, and follow up with the doctor(s) listed in these documents as recommended.  Return to the Emergency Department if you develop new or worsening symptoms that concern you.

## 2024-04-11 NOTE — TOC Progression Note (Incomplete)
 Transition of Care [facility]) - Progression Note    Patient Details  Name: David Atkins MRN: [id number] Date of Birth: 1929/05/10  Transition of Care [facility]) CM/SW Contact  Joslyn Nim, RN Phone Number: 04/11/2024, 7:20 PM  Clinical Narrative:     On call supervisor recievied message  from Dr. Leory Rands and Dr. Fausto Hooker that [facility] was not set up for patient. According to documentation and patient's AVS [facility] was the home health.  CM called [facility], [phone], and spoke with [facility], triage . She states that it is documented that patient decline services because patient states he had [facility]. She called her on call admin, Doug. He states that we will need to send another referral and intake will  review it on Monday . Cm sent message to [facility] and Dr. Fausto Hooker.    1933 CM called daughter [name]. Cm explained that [facility] states that patient decline their services. CM explained that a new referral will have to be sent and that no one in the office will be there over the weekend to verify his insurance. Daughter states that she has been in contact with the infusion company. She was initial doing IV push  but they changed to a balloon which is a slower infusion. Daughter states that she has been administering his medication. She is in agreement with referral process.  Expected Discharge Plan and Services         Expected Discharge Date: 04/10/24                                     Social Determinants of Health (SDOH) Interventions SDOH Screenings   Food Insecurity: No Food Insecurity (04/05/2024)  Housing: Unknown (04/05/2024)  Transportation Needs: No Transportation Needs (04/05/2024)  Utilities: Not At Risk (04/05/2024)  Alcohol Screen: Low Risk  (07/24/2023)  Depression (PHQ2-9): Medium Risk (10/26/2023)  Financial Resource Strain: Low Risk  (07/24/2023)  Physical Activity: Inactive (07/24/2023)  Social Connections: Socially Isolated (04/06/2024)  Stress: No Stress Concern  Present (07/24/2023)  Tobacco Use: Medium Risk (04/10/2024)    Readmission Risk Interventions     No data to display

## 2024-04-11 NOTE — Telephone Encounter (Signed)
 Called patient's daughter to see how he was tolerating the IV antibiotic after a vasovagal episode yesterday with IV push- HE is doing well now- Ha 24 hr cefazolin  isometric ball to deliver antibiotic. The  home nursewill se ehim tomorrow

## 2024-04-14 ENCOUNTER — Telehealth: Payer: Self-pay

## 2024-04-14 ENCOUNTER — Encounter: Payer: Self-pay | Admitting: Internal Medicine

## 2024-04-14 ENCOUNTER — Encounter (HOSPITAL_COMMUNITY): Payer: Self-pay

## 2024-04-14 NOTE — Telephone Encounter (Signed)
 Left vm with patient's daughter regarding HH.   Julien Odor, RMA

## 2024-04-14 NOTE — TOC Progression Note (Signed)
 Transition of Care [facility]) - Progression Note    Patient Details  Name: David Atkins MRN: [id number] Date of Birth: 07/09/1929  Transition of Care [facility]) CM/SW Contact  Joslyn Nim, RN Phone Number: 04/14/2024, 9:12 AM  Clinical Narrative:      CM spoke with Shawn @ [facility].  CM explained that patient went to the ED on 5/8 but was not admitted. CM  explained that patient and daughter is in agreement. He plan to reach out to them today and start of care would be Tuesday or Wednesday of this week.  CM called daughter [name] but had to leave a voice message.        Expected Discharge Plan and Services                                               Social Determinants of Health (SDOH) Interventions SDOH Screenings   Food Insecurity: No Food Insecurity (04/05/2024)  Housing: Unknown (04/05/2024)  Transportation Needs: No Transportation Needs (04/05/2024)  Utilities: Not At Risk (04/05/2024)  Alcohol Screen: Low Risk  (07/24/2023)  Depression (PHQ2-9): Medium Risk (10/26/2023)  Financial Resource Strain: Low Risk  (07/24/2023)  Physical Activity: Inactive (07/24/2023)  Social Connections: Socially Isolated (04/06/2024)  Stress: No Stress Concern Present (07/24/2023)  Tobacco Use: Medium Risk (04/10/2024)    Readmission Risk Interventions     No data to display

## 2024-04-14 NOTE — Telephone Encounter (Signed)
Thank you David Atkins!

## 2024-04-14 NOTE — Transitions of Care (Post Inpatient/ED Visit) (Signed)
 04/14/2024  Name: David Atkins MRN: [id number] DOB: 03-Dec-1929  Today's TOC FU Call Status: Today's TOC FU Call Status:: Successful TOC FU Call Completed TOC FU Call Complete Date: 04/14/24 Patient's Name and Date of Birth confirmed.  Transition Care Management Follow-up Telephone Call Date of Discharge: 04/10/24 Discharge Facility: [facility]) Type of Discharge: Inpatient Admission Primary Inpatient Discharge Diagnosis:: Syncope, MSSA Bacterimia How have you been since you were released from the hospital?: Better Any questions or concerns?: No  Items Reviewed: Did you receive and understand the discharge instructions provided?: Yes Medications obtained,verified, and reconciled?: Yes (Medications Reviewed) Any new allergies since your discharge?: No Dietary orders reviewed?: Yes Type of Diet Ordered:: Low Sodium Heart Healthy Do you have support at home?: Yes People in Home [RPT]: child(ren), adult, spouse Name of Support/Comfort Primary Source: David Atkins  Medications Reviewed Today: Medications Reviewed Today     Reviewed by Claudene Crystal, RN (Case Manager) on 04/14/24 at 1124  Med List Status: <None>   Medication Order Taking? Sig Documenting Provider Last Dose Status Informant  amLODipine  (NORVASC ) 2.5 MG tablet [id number] Yes TAKE 1 TABLET BY MOUTH DAILY Tullo, Teresa L, MD Taking Active Child  aspirin  81 MG EC tablet [id number] Yes Take 81 mg by mouth daily. [provider] Taking Active Spouse/Significant Other, Pharmacy Records, Child  atorvastatin  (LIPITOR) 20 MG tablet [id number] Yes TAKE 1 TABLET BY MOUTH DAILY Thersia Flax, MD Taking Active Child  Blood Pressure Monitoring (BLOOD PRESSURE MONITOR AUTOMAT) DEVI [id number]  Use daily to check blood pressure Thersia Flax, MD  Active Spouse/Significant Other, Pharmacy Records, Child  ceFAZolin  (ANCEF ) IVPB [id number] Yes Inject 2 g into the vein every 8 (eight)  hours for 10 days. Indication:  MSSA bacteremia First Dose: Yes Last Day of Therapy:  04/20/2024 Labs - Once weekly:  CBC/D and CMP Fax weekly lab results  promptly to [phone] Method of administration: IV Push Method of administration may be changed at the discretion of home infusion pharmacist based upon assessment of the patient and/or caregiver's ability to self-administer the medication ordered. Please pull PIC at completion of IV antibiotics Call [phone] with any questions Sheril Dines, MD Taking Active   cholecalciferol  (VITAMIN D ) 25 MCG (1000 UNIT) tablet [id number] Yes Take 1,000 Units by mouth daily. [provider] Taking Active Spouse/Significant Other, Pharmacy Records, Child  cholestyramine  (QUESTRAN ) 4 GM/DOSE powder [id number] Yes Take 0.5 packets (2 g total) by mouth 3 (three) times daily with meals. Thersia Flax, MD Taking Active Child  finasteride  (PROSCAR ) 5 MG tablet [id number] Yes TAKE 1 TABLET BY MOUTH DAILY Tullo, Teresa L, MD Taking Active Child  KLOR-CON  M20 20 MEQ tablet [id number] Yes TAKE 1 TABLET (20 MEQ TOTAL) BY MOUTH 2 (TWO) TIMES DAILY. WITH FOOD Thersia Flax, MD Taking Active Child  lidocaine  (LIDODERM ) 5 % [id number] No Place 1 patch onto the skin daily. Remove & Discard patch within 12 hours or as directed by MD  Patient not taking: Reported on 04/14/2024   Thersia Flax, MD Not Taking Active Child  lisinopril  (ZESTRIL ) 20 MG tablet [id number] Yes TAKE 1 TABLET BY MOUTH DAILY Thersia Flax, MD Taking Active Child  Menthol-Camphor (TIGER BALM ARTHRITIS RUB EX) [id number] Yes Apply 1 Application topically as needed. [provider] Taking Active Child  metoprolol  tartrate (LOPRESSOR ) 25 MG tablet [id number] Yes TAKE ONE-HALF TABLET BY  MOUTH TWICE DAILY Thersia Flax, MD Taking Active Child  nitroGLYCERIN  (NITROSTAT ) 0.4  MG SL tablet [id number] No Place 1 tablet (0.4 mg total) under the tongue every 5 (five) minutes as needed for  chest pain. Maximum of 3 doses. Gollan, Timothy J, MD Unknown Active Spouse/Significant Other, Pharmacy Records, Child           Med Note Army Landsman Jan 05, 2022  3:46 PM) As needed  omeprazole  (PRILOSEC) 20 MG capsule [id number] Yes TAKE 2 CAPSULES BY MOUTH DAILY Tullo, Teresa L, MD Taking Active Child  oxybutynin  (DITROPAN ) 5 MG tablet [id number] Yes TAKE 1 TABLET BY MOUTH TWICE  DAILY Thersia Flax, MD Taking Active Child  polyethylene glycol powder (GLYCOLAX /MIRALAX ) 17 GM/SCOOP powder [id number] Yes Take 17 g by mouth at bedtime. Dissolve 1 capful of power into any liquid and drink once daily Sheril Dines, MD Taking Active   rOPINIRole  (REQUIP ) 0.25 MG tablet [id number] Yes TAKE 1 TABLET BY MOUTH AT  BEDTIME Thersia Flax, MD Taking Active Child  silodosin (RAPAFLO) 4 MG CAPS capsule [id number] Yes Take 4 mg by mouth daily. [provider] Taking Active Child  Simethicone  Extra Strength 125 MG CAPS [id number] Yes Take 125 mg by mouth as needed (gas). [provider] Taking Active Spouse/Significant Other, Pharmacy Records, Child           Med Note Adrianne Horn   Thu Jan 05, 2022  3:52 PM) Taking 2 before dinner  Syringe/Needle, Disp, (SYRINGE 3CC/25GX1") 25G X 1" 3 ML MISC [id number] Yes Use for b12 injections Tullo, Teresa L, MD Taking Active Spouse/Significant Other, Pharmacy Records, Child  triamcinolone  cream (KENALOG ) 0.1 % [id number] Yes Apply 1 Application topically 2 (two) times daily. Until itching and rash resolve Thersia Flax, MD Taking Active Child           Med Note Felipe Horton, Lenon Radar   Sat Apr 05, 2024  7:06 AM) prn            Home Care and Equipment/Supplies: Were Home Health Services Ordered?: Yes Name of Home Health Agency:: [facility] Has Agency set up a time to come to your home?: Yes First Home Health Visit Date: 04/15/24 Any new equipment or medical supplies ordered?: Yes Name of Medical supply agency?: [facility] Were you able to get the equipment/medical supplies?: Yes Do you have any questions related to the use of the equipment/supplies?: No  Functional Questionnaire: Do you need assistance with bathing/showering or dressing?: No Do you need assistance with meal preparation?: Yes (His daughter provides meal preperation) Do you need assistance with eating?: No Do you have difficulty maintaining continence: Yes (Wears Depends) Do you need assistance with getting out of bed/getting out of a chair/moving?: No Do you have difficulty managing or taking your medications?: Yes (His daughter manages his medications)  Follow up appointments reviewed: PCP Follow-up appointment confirmed?: Yes Date of PCP follow-up appointment?: 04/21/24 Follow-up Provider: Tommi Fraise Specialist [facility] Follow-up appointment confirmed?: Yes Date of Specialist follow-up appointment?: 04/17/24 Follow-Up Specialty Provider:: Dr. Francee Inch Do you need transportation to your follow-up appointment?: No Do you understand care options if your condition(s) worsen?: Yes-patient verbalized understanding  SDOH Interventions Today    Flowsheet Row Most Recent Value  SDOH Interventions   Food Insecurity Interventions Intervention Not Indicated  Housing Interventions Intervention Not Indicated  Transportation Interventions Intervention Not Indicated  Utilities Interventions Intervention Not Indicated       Goals Addressed             This Visit's Progress  VBCI Transitions of Care (TOC) Care Plan       Problems:  Recent Hospitalization for treatment of Syncope, MSSA No Barriers  Goal:  Over the next 30 days, the patient will not experience hospital readmission  Interventions:   Evaluation of current treatment plan related to Syncope, UTI, MSSA, ADL IADL limitations self-management and patient's adherence to plan as established by provider. Discussed plans with patient for ongoing care management  follow up and provided patient with direct contact information for care management team Reviewed medications with patient and discussed Constipation, Urinary Incontinence Discussed plans with patient for ongoing care management follow up and provided patient with direct contact information for care management team Screening for signs and symptoms of depression related to chronic disease state   Patient Self Care Activities:  Attend all scheduled provider appointments Call pharmacy for medication refills 3-7 days in advance of running out of medications Call provider office for new concerns or questions  Notify RN Care Manager of [facility] call rescheduling needs Participate in Transition of Care Program/Attend [facility] scheduled calls Perform all self care activities independently  Take medications as prescribed    Plan:  Telephone follow up appointment with care management team member scheduled for:  Tuesday May 20th at 11:00am         [facility] completed today. The patient was discharged from the hospital last week and then went to the ED the same day due to syncope while receiving IV push antibiotics. 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 His daughter lives with him and is providing the daily administration. He is at baseline alert and oriented and ambulatory with a walker. HH [facility] to start services on 04/15/24. The patient has frequent UTI's and is at baseline incontinent of urine at times. Complains of some constipation. Appointments are made for PCP, Dermatology and ID.  Gareld June, BSN, RN [place]  VBCI - [employer] [facility] RN Care Manager [phone]

## 2024-04-14 NOTE — Patient Instructions (Signed)
 Visit Information  Thank you for taking time to visit with me today. Please don't hesitate to contact me if I can be of assistance to you before our next scheduled telephone appointment.  Our next appointment is by telephone on Tuesday May 20th at 11:00am  Following is a copy of your care plan:   Goals Addressed             This Visit's Progress    VBCI Transitions of Care (TOC) Care Plan       Problems:  Recent Hospitalization for treatment of Syncope, MSSA No Barriers  Goal:  Over the next 30 days, the patient will not experience hospital readmission  Interventions:   Evaluation of current treatment plan related to Syncope, UTI, MSSA, ADL IADL limitations self-management and patient's adherence to plan as established by provider. Discussed plans with patient for ongoing care management follow up and provided patient with direct contact information for care management team Reviewed medications with patient and discussed Constipation, Urinary Incontinence Discussed plans with patient for ongoing care management follow up and provided patient with direct contact information for care management team Screening for signs and symptoms of depression related to chronic disease state   Patient Self Care Activities:  Attend all scheduled provider appointments Call pharmacy for medication refills 3-7 days in advance of running out of medications Call provider office for new concerns or questions  Notify RN Care Manager of [facility] call rescheduling needs Participate in Transition of Care Program/Attend [facility] scheduled calls Perform all self care activities independently  Take medications as prescribed    Plan:  Telephone follow up appointment with care management team member scheduled for:  Tuesday May 20th at 11:00am        Patient verbalizes understanding of instructions and care plan provided today and agrees to view in MyChart. Active MyChart status and patient understanding of how  to access instructions and care plan via MyChart confirmed with patient.     The patient has been provided with contact information for the care management team and has been advised to call with any health related questions or concerns.   Please call the care guide team at [phone] if you need to cancel or reschedule your appointment.   Please call the Suicide and Crisis Lifeline: 988 call the [place]  National Suicide Prevention Lifeline: 7027809181 or TTY: 770-023-2688 TTY 203-753-4527) to talk to a trained counselor if you are experiencing a Mental Health or Behavioral Health Crisis or need someone to talk to.  Gareld June, BSN, RN [place]  VBCI - [employer] [facility] RN Care Manager [phone]

## 2024-04-14 NOTE — Telephone Encounter (Signed)
 Received following update from Pam with [employer],   Much back and forth but I finally connected with the Manager of Case Mgt at [facility].  I have spoken to a number of people this AM including Michaeline Adolf, Production designer, theatre/television/film of the Case Mgt team at [facility]. We have switched HHA and now [facility] will see pt tomorrow.

## 2024-04-14 NOTE — Telephone Encounter (Signed)
 Patient daughter left voicemail stating [facility] is missing orders for patient. Per d/c paperwork pt was referred to [facility], but declined services. After speaking with [name] (daughter) was informed that there was some confusion during admission regarding HH and that new order has been placed for patient to get set up with [facility] since [facility] is not in network with insurance.  Messaged Pam with [employer] for update regarding HH order. Will call [facility] with update; would like [facility] nurse to check picc line ASAP.  [name] did state since switching to "balloon" patient has not had any issues tolerating antibiotics. Is able to infuse without any issues.  Julien Odor, RMA

## 2024-04-15 ENCOUNTER — Encounter: Payer: Self-pay | Admitting: Internal Medicine

## 2024-04-15 ENCOUNTER — Other Ambulatory Visit: Payer: Self-pay | Admitting: Infectious Diseases

## 2024-04-15 ENCOUNTER — Telehealth: Payer: Self-pay

## 2024-04-15 DIAGNOSIS — R7881 Bacteremia: Secondary | ICD-10-CM | POA: Diagnosis not present

## 2024-04-15 DIAGNOSIS — B9561 Methicillin susceptible Staphylococcus aureus infection as the cause of diseases classified elsewhere: Secondary | ICD-10-CM | POA: Diagnosis not present

## 2024-04-15 MED ORDER — LINEZOLID 600 MG PO TABS
600.0000 mg | ORAL_TABLET | Freq: Two times a day (BID) | ORAL | 0 refills | Status: DC
Start: 2024-04-15 — End: 2024-04-17

## 2024-04-15 NOTE — Telephone Encounter (Signed)
 Cam,RN with [facility] stating that she is unable to flush midline or administer IV abx. Burdette Carolin, RN stated that patient also had a fall last night.   Per Dr.Ravishankar midline can be removed. EOT is 5/18. Patient has a follow up on 5/15 with Dr.Ravishankar.    RN stated that she has never removed a midline before and would need permission from her manager.   Pam Chandler,RN with [employer] will contact [facility] HH/RN to discuss removal of midline.     David Atkins, CMA

## 2024-04-15 NOTE — Telephone Encounter (Signed)
 Patient's daughter, Milas Shong, called regarding the message she sent to us  via MyChart last night.  [name] states she mentioned in the message that she would call us , so she is following through with what she said she would do.  I let [name] know that I spoke with Dr. Nyle Belling nurse, Camilo Cella, and relayed message to Carmichael from [place] that Dr. Madelon Scheuermann was out of the office yesterday and just got back in at 9:30am this morning.  I let [name] know that Camilo Cella did send her message to Dr. Madelon Scheuermann and they will respond once she has had a chance to read the message.  [name] states patient's Infectious Disease doctor will be reaching out to us  to find out which provider should complete the [place]-2 form for patient.  [name] states we may call her on her cell phone: [phone] or on their landline: [phone].

## 2024-04-15 NOTE — Telephone Encounter (Signed)
 Error

## 2024-04-15 NOTE — Progress Notes (Signed)
 Pt 's midline stopped working. So it will have to be removed HE has MSSA bacteremia And the wound on his forearm which cultured staph aureus , susceptibilioty back and it is MRSA So will do Linezolid 600mg  Po BID for 7 days Sent the prescription to [facility] and spoke to the daughter Explained the sid effects and foods to avoid Pt is on multiple meds that can cause  postural hypotension ( he has been having near fainting spells and falls) Silosodin Oxybutynin  Proscar  Amlodipine    ropinirole , ( Other meds metoprolol , omeprazole , lisinopril ) No interaction with linezolid.  The home health nurse cannot remove midline Midline will be removed tomorrow at [facility]

## 2024-04-16 ENCOUNTER — Telehealth: Payer: Self-pay

## 2024-04-16 DIAGNOSIS — R7881 Bacteremia: Secondary | ICD-10-CM | POA: Diagnosis not present

## 2024-04-16 DIAGNOSIS — R296 Repeated falls: Secondary | ICD-10-CM

## 2024-04-16 DIAGNOSIS — A4902 Methicillin resistant Staphylococcus aureus infection, unspecified site: Secondary | ICD-10-CM

## 2024-04-16 DIAGNOSIS — B9561 Methicillin susceptible Staphylococcus aureus infection as the cause of diseases classified elsewhere: Secondary | ICD-10-CM | POA: Diagnosis not present

## 2024-04-16 NOTE — Telephone Encounter (Signed)
 Per secure chat message from Dr. Madelon Scheuermann an emergent social worker referral has been placed.

## 2024-04-16 NOTE — Telephone Encounter (Signed)
 Pam with [employer] reached out this morning stating that a [facility] nurse will be going to the hoe to remove the midline today. I spoke with patient's daughter [name] and advised her that a nurse will be coming out.  Jaspal Pultz Adel Holt, CMA

## 2024-04-17 ENCOUNTER — Ambulatory Visit (INDEPENDENT_AMBULATORY_CARE_PROVIDER_SITE_OTHER): Admitting: Internal Medicine

## 2024-04-17 ENCOUNTER — Telehealth: Payer: Self-pay

## 2024-04-17 ENCOUNTER — Ambulatory Visit

## 2024-04-17 ENCOUNTER — Encounter: Payer: Self-pay | Admitting: Infectious Diseases

## 2024-04-17 ENCOUNTER — Other Ambulatory Visit
Admission: RE | Admit: 2024-04-17 | Discharge: 2024-04-17 | Disposition: A | Discharge status: Home / Self Care | Source: Ambulatory Visit | Attending: Infectious Diseases | Admitting: Infectious Diseases

## 2024-04-17 ENCOUNTER — Ambulatory Visit: Attending: Infectious Diseases | Admitting: Infectious Diseases

## 2024-04-17 ENCOUNTER — Encounter: Payer: Self-pay | Admitting: Licensed Clinical Social Worker

## 2024-04-17 ENCOUNTER — Encounter: Payer: Self-pay | Admitting: Internal Medicine

## 2024-04-17 ENCOUNTER — Other Ambulatory Visit: Payer: Self-pay | Admitting: Licensed Clinical Social Worker

## 2024-04-17 VITALS — BP 144/68 | HR 58 | Ht 70.0 in | Wt 172.0 lb

## 2024-04-17 VITALS — BP 145/78 | HR 64 | Temp 98.2°F

## 2024-04-17 DIAGNOSIS — I2510 Atherosclerotic heart disease of native coronary artery without angina pectoris: Secondary | ICD-10-CM | POA: Insufficient documentation

## 2024-04-17 DIAGNOSIS — N400 Enlarged prostate without lower urinary tract symptoms: Secondary | ICD-10-CM

## 2024-04-17 DIAGNOSIS — L089 Local infection of the skin and subcutaneous tissue, unspecified: Secondary | ICD-10-CM | POA: Diagnosis not present

## 2024-04-17 DIAGNOSIS — R7881 Bacteremia: Secondary | ICD-10-CM | POA: Insufficient documentation

## 2024-04-17 DIAGNOSIS — K589 Irritable bowel syndrome without diarrhea: Secondary | ICD-10-CM | POA: Diagnosis not present

## 2024-04-17 DIAGNOSIS — R55 Syncope and collapse: Secondary | ICD-10-CM

## 2024-04-17 DIAGNOSIS — Z7189 Other specified counseling: Secondary | ICD-10-CM | POA: Diagnosis not present

## 2024-04-17 DIAGNOSIS — I10 Essential (primary) hypertension: Secondary | ICD-10-CM | POA: Diagnosis not present

## 2024-04-17 DIAGNOSIS — N401 Enlarged prostate with lower urinary tract symptoms: Secondary | ICD-10-CM | POA: Diagnosis not present

## 2024-04-17 DIAGNOSIS — E785 Hyperlipidemia, unspecified: Secondary | ICD-10-CM | POA: Diagnosis not present

## 2024-04-17 DIAGNOSIS — N3000 Acute cystitis without hematuria: Secondary | ICD-10-CM | POA: Diagnosis not present

## 2024-04-17 DIAGNOSIS — B9562 Methicillin resistant Staphylococcus aureus infection as the cause of diseases classified elsewhere: Secondary | ICD-10-CM | POA: Insufficient documentation

## 2024-04-17 DIAGNOSIS — Z9181 History of falling: Secondary | ICD-10-CM | POA: Diagnosis not present

## 2024-04-17 DIAGNOSIS — R2231 Localized swelling, mass and lump, right upper limb: Secondary | ICD-10-CM | POA: Insufficient documentation

## 2024-04-17 DIAGNOSIS — I110 Hypertensive heart disease with heart failure: Secondary | ICD-10-CM | POA: Insufficient documentation

## 2024-04-17 DIAGNOSIS — B9561 Methicillin susceptible Staphylococcus aureus infection as the cause of diseases classified elsewhere: Secondary | ICD-10-CM | POA: Insufficient documentation

## 2024-04-17 DIAGNOSIS — G4733 Obstructive sleep apnea (adult) (pediatric): Secondary | ICD-10-CM | POA: Diagnosis not present

## 2024-04-17 DIAGNOSIS — R159 Full incontinence of feces: Secondary | ICD-10-CM

## 2024-04-17 DIAGNOSIS — I5032 Chronic diastolic (congestive) heart failure: Secondary | ICD-10-CM | POA: Insufficient documentation

## 2024-04-17 DIAGNOSIS — Z8673 Personal history of transient ischemic attack (TIA), and cerebral infarction without residual deficits: Secondary | ICD-10-CM | POA: Diagnosis not present

## 2024-04-17 DIAGNOSIS — R29898 Other symptoms and signs involving the musculoskeletal system: Secondary | ICD-10-CM | POA: Diagnosis not present

## 2024-04-17 LAB — CBC WITH DIFFERENTIAL/PLATELET
Abs Immature Granulocytes: 0.03 10*3/uL (ref 0.00–0.07)
Basophils Absolute: 0 10*3/uL (ref 0.0–0.1)
Basophils Relative: 0 %
Eosinophils Absolute: 0.3 10*3/uL (ref 0.0–0.5)
Eosinophils Relative: 5 %
HCT: 36 % — ABNORMAL LOW (ref 39.0–52.0)
Hemoglobin: 12.2 g/dL — ABNORMAL LOW (ref 13.0–17.0)
Immature Granulocytes: 0 %
Lymphocytes Relative: 24 %
Lymphs Abs: 1.7 10*3/uL (ref 0.7–4.0)
MCH: 33.6 pg (ref 26.0–34.0)
MCHC: 33.9 g/dL (ref 30.0–36.0)
MCV: 99.2 fL (ref 80.0–100.0)
Monocytes Absolute: 0.6 10*3/uL (ref 0.1–1.0)
Monocytes Relative: 9 %
Neutro Abs: 4.4 10*3/uL (ref 1.7–7.7)
Neutrophils Relative %: 62 %
Platelets: 169 10*3/uL (ref 150–400)
RBC: 3.63 MIL/uL — ABNORMAL LOW (ref 4.22–5.81)
RDW: 12.6 % (ref 11.5–15.5)
WBC: 7.2 10*3/uL (ref 4.0–10.5)
nRBC: 0 % (ref 0.0–0.2)

## 2024-04-17 LAB — COMPREHENSIVE METABOLIC PANEL WITH GFR
ALT: 5 U/L (ref 0–44)
AST: 20 U/L (ref 15–41)
Albumin: 3.2 g/dL — ABNORMAL LOW (ref 3.5–5.0)
Alkaline Phosphatase: 60 U/L (ref 38–126)
Anion gap: 10 (ref 5–15)
BUN: 16 mg/dL (ref 8–23)
CO2: 25 mmol/L (ref 22–32)
Calcium: 8.5 mg/dL — ABNORMAL LOW (ref 8.9–10.3)
Chloride: 108 mmol/L (ref 98–111)
Creatinine, Ser: 0.8 mg/dL (ref 0.61–1.24)
GFR, Estimated: >60 mL/min (ref >60)
Glucose, Bld: 139 mg/dL — ABNORMAL HIGH (ref 70–99)
Potassium: 3.4 mmol/L — ABNORMAL LOW (ref 3.5–5.1)
Sodium: 143 mmol/L (ref 135–145)
Total Bilirubin: 0.5 mg/dL (ref 0.0–1.2)
Total Protein: 5.9 g/dL — ABNORMAL LOW (ref 6.5–8.1)

## 2024-04-17 MED ORDER — MUPIROCIN 2 % EX OINT: 1.0000 | TOPICAL_OINTMENT | Freq: Every day | CUTANEOUS | 0 refills | Status: DC

## 2024-04-17 MED ORDER — ATORVASTATIN CALCIUM 20 MG PO TABS: 20.0000 mg | ORAL_TABLET | Freq: Every day | ORAL | 2 refills | Status: DC

## 2024-04-17 MED ORDER — LINEZOLID 600 MG PO TABS: 600.0000 mg | ORAL_TABLET | Freq: Two times a day (BID) | ORAL | 0 refills | Status: DC

## 2024-04-17 MED ORDER — METOPROLOL TARTRATE 25 MG PO TABS: ORAL_TABLET | ORAL | 1 refills | Status: DC

## 2024-04-17 MED ORDER — ROPINIROLE HCL 0.25 MG PO TABS
0.2500 mg | ORAL_TABLET | Freq: Every day | ORAL | 1 refills | Status: AC
Start: 2024-04-17 — End: ?

## 2024-04-17 MED ORDER — TADALAFIL 5 MG PO TABS: 5.0000 mg | ORAL_TABLET | Freq: Every day | ORAL | 11 refills | Status: DC

## 2024-04-17 NOTE — Assessment & Plan Note (Addendum)
 Due to loss of Midline acess,  IV abx treatment has been changed from ANCEF  to oral linezolid by ID and will be continued through May and will cover his skin biopsy next week .  TTE was negative during hospitalization for vegetations.  Repeat blood cultures will be drawn today along with CBC at [facility]

## 2024-04-17 NOTE — Progress Notes (Signed)
 Complex Care Management Note  Care Guide Note 04/17/2024 Name: David Atkins MRN: [id number] DOB: 01/25/1929  David Atkins is a 89 y.o. year old male who sees Madelon Scheuermann, Ray Caffey, MD for primary care. I reached out to Ralph Burke Magloire by phone today to offer complex care management services.  David Atkins was given information about Complex Care Management services today including:   The Complex Care Management services include support from the care team which includes your Nurse Care Manager, Clinical Social Worker, or Pharmacist.  The Complex Care Management team is here to help remove barriers to the health concerns and goals most important to you. Complex Care Management services are voluntary, and the patient may decline or stop services at any time by request to their care team member.   Complex Care Management Consent Status: Patient agreed to services and verbal consent obtained.   Follow up plan:  Telephone appointment with complex care management team member scheduled for:  04/17/2024  Encounter Outcome:  Patient Scheduled  Lenton Rail , RMA       [facility], [facility] Guide  Direct Dial: [phone]  Website: [email or web address]

## 2024-04-17 NOTE — Progress Notes (Signed)
 Complex Care Management Note Care Guide Note  04/17/2024 Name: David Atkins MRN: [id number] DOB: [birth year]   Complex Care Management Outreach Attempts: An unsuccessful telephone outreach was attempted today to offer the patient information about available complex care management services.  Follow Up Plan:  Additional outreach attempts will be made to offer the patient complex care management information and services.   Encounter Outcome:  No Answer  Lenton Rail , RMA     [place]  [facility], [facility] Guide  Direct Dial: [phone]  Website: [email or web address]

## 2024-04-17 NOTE — Assessment & Plan Note (Signed)
 DNR status has been requested by patient during prior visit,  order placed.

## 2024-04-17 NOTE — Assessment & Plan Note (Signed)
 Secondary to E Coli,  pan sensitive.  Has received Rocephin  followed by ANCEF , now on linezolid for conurrent MSSA bacteremia and MRSA cultured from skin wound on forearm

## 2024-04-17 NOTE — Telephone Encounter (Signed)
 Pharmacy Patient Advocate Encounter   Received notification from CoverMyMeds that prior authorization for Tadalafil 5MG  tablets is required/requested.   Insurance verification completed.   The patient is insured through [facility] .   Per test claim: PA required; PA submitted to above mentioned insurance via CoverMyMeds Key/confirmation #/EOC [id number] Status is pending

## 2024-04-17 NOTE — Assessment & Plan Note (Signed)
 Attributed to vasovagal episodes  after coprehensive cardiology and neurology evaluations,:  Symptoms of syncope spells are most consistent with physical, physiological or emotional stress.  - Reviewed routine and 3 hour EEGs, within normal limits. Negative for epilepsy.

## 2024-04-17 NOTE — Progress Notes (Signed)
 Subjective:  Patient ID: David Atkins, male    DOB: 1929/04/03  Age: 89 y.o. MRN: [id number]  CC: The primary encounter diagnosis was Benign prostatic hyperplasia with lower urinary tract symptoms, symptom details unspecified. Diagnoses of Full incontinence of feces, Do not resuscitate discussion, Syncope and collapse, Weakness of lower extremity, unspecified laterality, MSSA bacteremia, and Acute cystitis without hematuria were also pertinent to this visit.   HPI David Atkins presents for follow up on recent hospitalization.  He is accompanied by his daughter David Atkins and his son David Atkins.  David Atkins is a 89 yr old male with  CAD, HTN with chronic diastolic CHF, BPH with outflow obstruction and  recurrent syncope,   who was hospitalized at [facility] from May 3 to May 8 after presenting with AMS and hypotension following an explosive diarrheal episode at home .  HOme readings of BP were  90 systolic and he was noted to have bilateral facial droop , lethargy and verbal unresponsiveness.  David Atkins  He was taken by EMS to [facility] and underwent head CT which was negative for acute CVA and chest x ray neg for PNA?  David Atkins He was  admitted with diagnosis of  UTI secondary to E COLI  .  Blood cultures were positive for  MSSA bacteremia , and Rocephin  was started.   Workup for bacteremia  included ECHO negtive for vegetations and he was treated with rocephin  followed by ANCEF .  He  was  discharged on May 8 to home with Home Health for PT and RN with plans to continue IV ANCEF  three times daily  via MIDLine.  Per daughter there was another event  the night before discharge of either ss of balance vsnear  syncope (not documented)  but was ambulated without difficulty the morning of discharge. Per David Atkins Case management was not present at discharge,  and they were not offered placement for rehab.  Daughter was instructed  for 20 minutes on how to "push" the IV antibiotics via a MID LINE  three times daily .   Went home on Friday May  8.  He was also noted bo have an infected  ulcerated right forearm skin nodule (however, culture of wound ultimately  grew MRSA).  Since discharge David Atkins has been weak, lethargic and has had a fall that resulted in a scalp laceration .  Daughter noted recurrence of slackened  facial muscles ,  "thousand mile stare"  (similar symptoms as was witnessed on May 3,) after bringing him home on May 8.  She Returned patient to ER . After waiting several hours to be seen he was evaluated by EDP.   Vital signs were normal and he was discharged to home.  His abx was changed to a once daily medicine ball" IV)  for onvenience,   which was used over the weekend  However,   On Monday night he fell while standing in his office , fell face down and struck his head on a cabinet. The scalp was lacerated. David Atkins dressed the wound .ON Tuesday May 13 the IV line would not function  per home health  and ID recommended removal of mid line and change to oral abx .  The midline was removed by the home health RN.  The plan is to continue linezolid until AFTER his skin biopsy on May 21 per ID follow up yesterday. [facility] and [facility]    The patient states that he is aware  of his surroundings when the staring episodes occur,  and he usually has some warning of an impending episode.    His near syncope at admission was attributed to hypotension and dehydration.  He was not orthostatic He was noted to be on multiple medications that can cause hypotension (amldipine metoprolol , lisinopril  silodosin oxybutynin  and proscar )   but none were stopped.  Chronic pruritus : was noted to have improved with cholestyramine  but patient notes greater relief since suspending silodosin several days ago.  He is voiding without issues.   .   Outpatient Medications Prior to Visit  Medication Sig Dispense Refill   aspirin  81 MG EC tablet Take 81 mg by mouth daily.     Blood Pressure Monitoring (BLOOD PRESSURE MONITOR  AUTOMAT) DEVI Use daily to check blood pressure 1 Device 0   cholecalciferol  (VITAMIN D ) 25 MCG (1000 UNIT) tablet Take 1,000 Units by mouth daily.     cholestyramine  (QUESTRAN ) 4 GM/DOSE powder Take 0.5 packets (2 g total) by mouth 3 (three) times daily with meals. 378 g 12   finasteride  (PROSCAR ) 5 MG tablet TAKE 1 TABLET BY MOUTH DAILY 100 tablet 2   KLOR-CON  M20 20 MEQ tablet TAKE 1 TABLET (20 MEQ TOTAL) BY MOUTH 2 (TWO) TIMES DAILY. WITH FOOD 4 tablet 0   lidocaine  (LIDODERM ) 5 % Place 1 patch onto the skin daily. Remove & Discard patch within 12 hours or as directed by MD 30 patch 0   linezolid (ZYVOX) 600 MG tablet Take 1 tablet (600 mg total) by mouth 2 (two) times daily. 8 tablet 0   lisinopril  (ZESTRIL ) 20 MG tablet TAKE 1 TABLET BY MOUTH DAILY 100 tablet 2   Menthol-Camphor (TIGER BALM ARTHRITIS RUB EX) Apply 1 Application topically as needed.     mupirocin ointment (BACTROBAN) 2 % Apply 1 Application topically daily. 22 g 0   omeprazole  (PRILOSEC) 20 MG capsule TAKE 2 CAPSULES BY MOUTH DAILY 200 capsule 2   oxybutynin  (DITROPAN ) 5 MG tablet TAKE 1 TABLET BY MOUTH TWICE  DAILY 200 tablet 2   polyethylene glycol powder (GLYCOLAX /MIRALAX ) 17 GM/SCOOP powder Take 17 g by mouth at bedtime. Dissolve 1 capful of power into any liquid and drink once daily     Simethicone  Extra Strength 125 MG CAPS Take 125 mg by mouth as needed (gas).     Syringe/Needle, Disp, (SYRINGE 3CC/25GX1") 25G X 1" 3 ML MISC Use for b12 injections 50 each 0   triamcinolone  cream (KENALOG ) 0.1 % Apply 1 Application topically 2 (two) times daily. Until itching and rash resolve 80 g 0   amLODipine  (NORVASC ) 2.5 MG tablet TAKE 1 TABLET BY MOUTH DAILY 100 tablet 2   atorvastatin  (LIPITOR) 20 MG tablet TAKE 1 TABLET BY MOUTH DAILY 100 tablet 2   metoprolol  tartrate (LOPRESSOR ) 25 MG tablet TAKE ONE-HALF TABLET BY  MOUTH TWICE DAILY 100 tablet 1   nitroGLYCERIN  (NITROSTAT ) 0.4 MG SL tablet Place 1 tablet (0.4 mg total) under  the tongue every 5 (five) minutes as needed for chest pain. Maximum of 3 doses. 25 tablet 3   rOPINIRole  (REQUIP ) 0.25 MG tablet TAKE 1 TABLET BY MOUTH AT  BEDTIME 100 tablet 1   silodosin (RAPAFLO) 4 MG CAPS capsule Take 4 mg by mouth daily.     No facility-administered medications prior to visit.    Review of Systems;  Patient denies headache, fevers, malaise, unintentional weight loss, skin rash, eye pain, sinus congestion and sinus pain, sore throat, dysphagia,  hemoptysis ,  cough, dyspnea, wheezing, chest pain, palpitations, orthopnea, edema, abdominal pain, nausea, melena, diarrhea, constipation, flank pain, dysuria, hematuria, urinary  Frequency, nocturia, numbness, tingling, seizures,  Focal weakness, Loss of consciousness,  Tremor, insomnia, depression, anxiety, and suicidal ideation.      Objective:  BP (!) 144/68   Pulse (!) 58   Ht 5\' 10"  (1.778 m)   Wt 172 lb (78 kg)   SpO2 97%   BMI 24.68 kg/m   BP Readings from Last 3 Encounters:  04/17/24 (!) 144/68  04/17/24 (!) 145/78  04/11/24 (!) 156/76    Wt Readings from Last 3 Encounters:  04/17/24 172 lb (78 kg)  04/14/24 186 lb (84.4 kg)  04/10/24 186 lb (84.4 kg)    Physical Exam Vitals reviewed.  Constitutional:      General: He is not in acute distress.    Appearance: Normal appearance. He is normal weight. He is not ill-appearing, toxic-appearing or diaphoretic.     Comments: Chronically ill appearing   HENT:     Head: Normocephalic.  Eyes:     General: No scleral icterus.       Right eye: No discharge.        Left eye: No discharge.     Conjunctiva/sclera: Conjunctivae normal.  Cardiovascular:     Rate and Rhythm: Normal rate and regular rhythm.     Heart sounds: Normal heart sounds.  Pulmonary:     Effort: Pulmonary effort is normal. No respiratory distress.     Breath sounds: Normal breath sounds.  Musculoskeletal:        General: Normal range of motion.     Cervical back: Normal range of motion.   Skin:    General: Skin is warm and dry.  Neurological:     General: No focal deficit present.     Mental Status: He is alert and oriented to person, place, and time. Mental status is at baseline.     Motor: Weakness present.     Comments: Bilateral leg weakness,  unable to stand without maximal assistance   Psychiatric:        Mood and Affect: Mood normal.        Behavior: Behavior normal.        Thought Content: Thought content normal.        Judgment: Judgment normal.     Lab Results  Component Value Date   HGBA1C 5.7 11/17/2019   HGBA1C 5.7 02/08/2018   HGBA1C 5.6 11/02/2017    Lab Results  Component Value Date   CREATININE 0.74 04/10/2024   CREATININE 0.77 04/09/2024   CREATININE 0.83 04/08/2024    Lab Results  Component Value Date   WBC 8.9 04/10/2024   HGB 13.5 04/10/2024   HCT 40.2 04/10/2024   PLT 231 04/10/2024   GLUCOSE 116 (H) 04/10/2024   CHOL 98 12/07/2022   TRIG 92.0 12/07/2022   HDL 39.50 12/07/2022   LDLDIRECT CANCELED 11/02/2017   LDLCALC 40 12/07/2022   ALT 8 04/10/2024   AST 31 04/10/2024   NA 142 04/10/2024   K 4.1 04/10/2024   CL 108 04/10/2024   CREATININE 0.74 04/10/2024   BUN 9 04/10/2024   CO2 25 04/10/2024   TSH 1.70 06/04/2023   INR 1.2 12/27/2022   HGBA1C 5.7 11/17/2019   MICROALBUR <0.7 02/08/2018    No results found.  Assessment & Plan:  .Benign prostatic hyperplasia with lower urinary tract symptoms, symptom details unspecified Assessment & Plan: His BPH medications may be contributing  to his syncopal events ; he has tried many medications for management of urinary retention and OAB but not Cialis .  he has stopped Rapaflo on his own  4 days ago.    Rx for 5 mg CIALIS has been sent to pharmacy for use  if he develops symptoms of urinary retnetion.  NITROGLYCERIN  REMOVED FROM MED LIST (NOT TAKING)    Full incontinence of feces Assessment & Plan: Episodic secondary to overuse of osmotic laxatives during periods of  obstipation. Episodes have resulted in  syncopal events ,as well  .  Neurology evaluation has been done by  Dr Mason Sole ; no evidence of seizure disorder    Do not resuscitate discussion Assessment & Plan: DNR status has been requested by patient during prior visit,  order placed.   Orders: -     Do not attempt resuscitation (DNR)  Syncope and collapse Assessment & Plan: Attributed to vasovagal episodes  after coprehensive cardiology and neurology evaluations,:  Symptoms of syncope spells are most consistent with physical, physiological or emotional stress.  - Reviewed routine and 3 hour EEGs, within normal limits. Negative for epilepsy.    Weakness of lower extremity, unspecified laterality Assessment & Plan: He was discharged from [facility] on May 8 with home Health ordered for PT but has already had a fall resulting in a scalp laceration . The fall occurred despite using his walker.  Given his age,  it is likely that his UTI and MSSA bacteremia are causing his weakness, and a short stay for rehab should have been offered prior to discharge but per David Atkins his daughter,  was not.  I will complete and FL-2 form for placement.  He understands that if he fails to improve, long term placement will be needed    MSSA bacteremia Assessment & Plan: Due to loss of Midline acess,  IV abx treatment has been changed from ANCEF  to oral linezolid by ID and will be continued through May and will cover his skin biopsy next week .  TTE was negative during hospitalization for vegetations.  Repeat blood cultures will be drawn today along with CBC at [facility]   Acute cystitis without hematuria Assessment & Plan: Secondary to E Coli,  pan sensitive.  Has received Rocephin  followed by ANCEF , now on linezolid for conurrent MSSA bacteremia and MRSA cultured from skin wound on forearm    Other orders -     Atorvastatin  Calcium ; Take 1 tablet (20 mg total) by mouth daily.  Dispense: 100 tablet; Refill: 2 -      Metoprolol  Tartrate; TAKE ONE-HALF TABLET BY  MOUTH TWICE DAILY  Dispense: 100 tablet; Refill: 1 -     rOPINIRole  HCl; Take 1 tablet (0.25 mg total) by mouth at bedtime.  Dispense: 100 tablet; Refill: 1 -     Tadalafil; Take 1 tablet (5 mg total) by mouth daily.  Dispense: 30 tablet; Refill: 11     I spent 90  minutes on the day of this face to face encounter reviewing patient's   recent hospitalization,  most recent ER visits,  all communications with infectious disease, previous visit with cardiology and neurology,  prior relevant surgical and non surgical procedures, recent  labs and imaging studies,  reviewing the assessment and plan with patient, and post visit ordering and reviewing of  diagnostics and therapeutics with patient  .   Follow-up: No follow-ups on file.   Thersia Flax, MD

## 2024-04-17 NOTE — Assessment & Plan Note (Addendum)
 His BPH medications may be contributing to his syncopal events ; he has tried many medications for management of urinary retention and OAB but not Cialis .  he has stopped Rapaflo on his own  4 days ago.    Rx for 5 mg CIALIS has been sent to pharmacy for use  if he develops symptoms of urinary retnetion.  NITROGLYCERIN  REMOVED FROM MED LIST (NOT TAKING)

## 2024-04-17 NOTE — Assessment & Plan Note (Signed)
 He was discharged from [facility] on May 8 with home Health ordered for PT but has already had a fall resulting in a scalp laceration . The fall occurred despite using his walker.  Given his age,  it is likely that his UTI and MSSA bacteremia are causing his weakness, and a short stay for rehab should have been offered prior to discharge but per [name] his daughter,  was not.  I will complete and FL-2 form for placement.  He understands that if he fails to improve, long term placement will be needed

## 2024-04-17 NOTE — Assessment & Plan Note (Signed)
 Episodic secondary to overuse of osmotic laxatives during periods of obstipation. Episodes have resulted in  syncopal events ,as well  .  Neurology evaluation has been done by  Dr Mason Sole ; no evidence of seizure disorder

## 2024-04-17 NOTE — Patient Instructions (Signed)
 Today, you came in for a follow-up of MSSA bacteremia  and to discuss your medications.  You also have a skin nodule that has MRSA You also mentioned a skin irritation that has improved since stopping silosodin. Additionally, you have been managing constipation with Miralax  and have no new issues with dizziness or instability. We also discussed your history of urinary tract infections and your current use of a walker for mobility.  YOUR PLAN:  -METHICILLIN-SUSCEPTIBLE STAPHYLOCOCCUS AUREUS (MSSA) BACTEREMIA: MSSA bacteremia is a blood infection caused by a type of bacteria called Staphylococcus aureus. You are currently on day three of your antibiotic course with linezolid, which you will continue until Apr 23, 2024. Blood cultures will be repeated 10- 14 days  ( you can come on June 2nd)  after finishing the antibiotics to ensure the infection is cleared.  -MRSA SKIN INFECTION: looks like this is more MRSA colonizing the skin nodule  Use mupirocin ointment and cover it with dressing- wash hands after dressing.   -KERATOCANTHOMA:  VS pyogenic granuloma Keratocanthoma is a rapidly growing skin lesion that may resemble skin cancer. You have a dermatology appointment next Tuesday for evaluation and possible removal of the lesion.   -HYPERTENSION: Hypertension is high blood pressure. You are currently managing it with amlodipine . We will monitor your blood pressure in different positions to check for postural hypotension. You are also scheduled to see Doctor Tulo for further evaluation today.  -BENIGN PROSTATIC HYPERPLASIA: Benign prostatic hyperplasia is an enlarged prostate gland that can cause urinary symptoms.   -CONSTIPATION: Constipation is difficulty in passing stools. Your constipation has resolved with the use of Miralax . You should continue to use Miralax  as needed.  INSTRUCTIONS:  Please continue taking linezolid until Apr 23 2024, and monitor your blood pressure as discussed. You have a  dermatology appointment next Tuesday for your skin lesion. Blood cultures will be repeated one on 6/2/25r finishing your antibiotics. You are also scheduled to see PCP today for further evaluation of your blood pressure. She can check your blood test

## 2024-04-17 NOTE — Progress Notes (Signed)
 NAME: David Atkins  DOB: 1929-01-28  MRN: [id number]  Date/Time: 04/17/2024 11:53 AM   Subjective:  Pt is here with daughter and son Consented to the use of AI scribe  Devren Ryther is a 89 year old male  with history of CAD status post CABG, BPH, diastolic heart failure, hypertension, hyperlipidemia, OSA on CPAP, IBS, history of seizures but not on any meds CVA found incidentally on imaging presented to [facility] on 5/ 3 after a near syncopal episode and found to have MSSA bacteremia 1/4 . Staretd Iv antibiotic , repeat blood culture neg. HE was sent home on cefazolin  IV q8. For a total of 2 weeks until 04/20/24 .After first push at home he developed vasovagal and came to ED- the push was changed to continuous infusion- On Tuesday the Midline stopped workign and he was started on PO linezolid. Mid line removed yesterday Had a fast growing nodule on his rt forearm and the culture was MRSA and rare enterobacter  He has experienced significant improvement in his skin infection after transitioning from intravenous to oral antibiotics. He has been on linezolid for three days and is expected to complete the course by Apr 20, 2024. He takes the medication with applesauce and notes no pain or irritation from the infection.  He mentions a skin irritation that has eased since discontinuing silosadin , which he stopped taking four days ago. No dizziness or instability since stopping the medication, although he notes a long-standing issue with poor stability.  He has experienced constipation, which he managed with Miralax , and denies any diarrhea. He uses a walker at home for mobility and states he cannot walk without it. No coughing or shortness of breath, but mentions occasional throat irritation causing brief coughing.  He has a history of urinary tract infections, with two occurrences in the past six months. He wears pull-ups at night due to leakage. This admission had ecoli and  klebsiella in urine  Fishing/hunting/animal bird exposure Past Medical History:  Diagnosis Date   3-vessel coronary artery disease 2006   a.) s/p 3v revascularization (CABG)   Acute cholecystitis    Anxiety    Basal cell carcinoma 07/09/2012   Bilateral carotid artery stenosis    a.) doppler 07/13/2017L < 39% BICA; b.) doppler 11/05/2018: 40-59% RICA, 60-79% LICA; c.) doppler 11/11/2019, 11/16/2020, 11/15/2021: 1-39% RICA; 40-59% LICA; d.) doppler 11/24/2022: 1-39% BICA   BPH (benign prostatic hyperplasia)    BPPV (benign paroxysmal positional vertigo)    Cerebrovascular small vessel disease 01/02/2018   a.) brain MRI 01/02/2018: chronic small vessel ischemic changes within the cerebral white matter, basal ganglia and pons; b.) brain MRI 11/08/2025: progressive small vessel disease since previous MRI   Cholelithiasis    Chronic diastolic (congestive) heart failure (HCC)    a.) TTE 09/27/2022: EF 60-65%, mild MR, G1DD   Concussion w loss of consciousness of unsp duration, subs 12/29/2018   CVA (cerebral vascular accident) [facility])    a.) brain MRI 11/2022: lacunar infarcts within the bilateral centrum semiovale and left corona radiata   First degree heart block    Full incontinence of feces    GERD (gastroesophageal reflux disease)    Hx of CABG    Hyperlipidemia    Hypertension    Hypokalemia    IBS (irritable bowel syndrome)    Lower leg edema    Lumbar stenosis with neurogenic claudication 12/03/2013   Myocardial infarction [facility]) 2006   a.) Tx'd with 3v revascularization (CABG)  OAB (overactive bladder)    OSA on CPAP    Osteoporosis 2010   by DEXA at [facility]   PAD (peripheral artery disease) (HCC)    Poor balance    PSVT (paroxysmal supraventricular tachycardia) (HCC) 11/02/2022   a.) holter 11/02/2022: SVT x 128 episodes --> fastest 4 beats at a max rate of 179 bpm; longested 17 beats at average rate of 98 bpm   RLS (restless legs syndrome)    a.) on ropinirole    S/P CABG x 3  2006   Seizure (HCC)    Sepsis (HCC)    Sepsis (HCC) 12/27/2022   Spondylolisthesis of lumbar region 11/23/2013   MRI Lumbar spine   Umbilical hernia     Past Surgical History:  Procedure Laterality Date   APPENDECTOMY  1951   COLONOSCOPY     COLONOSCOPY WITH PROPOFOL  N/A 11/15/2015   Procedure: COLONOSCOPY WITH PROPOFOL ;  Surgeon: Stephens Eis, MD;  Location: [facility] ENDOSCOPY;  Service: Gastroenterology;  Laterality: N/A;   CORONARY ARTERY BYPASS GRAFT  2006   3 vessel, s/p AMI   INTRAOPERATIVE CHOLANGIOGRAM  03/13/2023   Procedure: INTRAOPERATIVE CHOLANGIOGRAM;  Surgeon: Alben Alma, MD;  Location: [facility] ORS;  Service: General;;   IR EXCHANGE BILIARY DRAIN  02/28/2023   IR PERC CHOLECYSTOSTOMY  12/27/2022   LEFT HEART CATH AND CORONARY ANGIOGRAPHY Left 2006   NASAL SEPTOPLASTY W/ TURBINOPLASTY  1981   TONSILLECTOMY     UMBILICAL HERNIA REPAIR N/A 03/13/2023   Procedure: HERNIA REPAIR UMBILICAL ADULT;  Surgeon: Alben Alma, MD;  Location: [facility] ORS;  Service: General;  Laterality: N/A;    Social History   Socioeconomic History   Marital status: Married    Spouse name: Not on file   Number of children: Not on file   Years of education: Not on file   Highest education level: Not on file  Occupational History   Not on file  Tobacco Use   Smoking status: Former    Current packs/day: 0.00    Types: Cigarettes    Quit date: 07/10/1963    Years since quitting: 60.8   Smokeless tobacco: Never  Vaping Use   Vaping status: Never Used  Substance and Sexual Activity   Alcohol use: No   Drug use: No   Sexual activity: Not Currently  Other Topics Concern   Not on file  Social History Narrative   Married   Social Drivers of Health   Financial Resource Strain: Low Risk  (07/24/2023)   Overall Financial Resource Strain (CARDIA)    Difficulty of Paying Living Expenses: Not hard at all  Food Insecurity: No Food Insecurity (04/14/2024)   Hunger Vital Sign    Worried About Running Out  of Food in the Last Year: Never true    Ran Out of Food in the Last Year: Never true  Transportation Needs: No Transportation Needs (04/14/2024)   PRAPARE - Administrator, Civil Service (Medical): No    Lack of Transportation (Non-Medical): No  Physical Activity: Inactive (07/24/2023)   Exercise Vital Sign    Days of Exercise per Week: 0 days    Minutes of Exercise per Session: 0 min  Stress: No Stress Concern Present (07/24/2023)   [employer] of Occupational Health - Occupational Stress Questionnaire    Feeling of Stress : Not at all  Social Connections: Socially Isolated (04/06/2024)   Social Connection and Isolation Panel [NHANES]    Frequency of Communication with Friends and Family: Once  a week    Frequency of Social Gatherings with Friends and Family: Once a week    Attends Religious Services: Never    Database administrator or Organizations: No    Attends Banker Meetings: Never    Marital Status: Married  Catering manager Violence: Not At Risk (04/14/2024)   Humiliation, Afraid, Rape, and Kick questionnaire    Fear of Current or Ex-Partner: No    Emotionally Abused: No    Physically Abused: No    Sexually Abused: No    Family History  Problem Relation Age of Onset   Hyperlipidemia Mother    Hypertension Mother    Hypertension Father    Allergies  Allergen Reactions   Sulfa Antibiotics Rash   I? Current Outpatient Medications  Medication Sig Dispense Refill   amLODipine  (NORVASC ) 2.5 MG tablet TAKE 1 TABLET BY MOUTH DAILY 100 tablet 2   aspirin  81 MG EC tablet Take 81 mg by mouth daily.     atorvastatin  (LIPITOR) 20 MG tablet TAKE 1 TABLET BY MOUTH DAILY 100 tablet 2   Blood Pressure Monitoring (BLOOD PRESSURE MONITOR AUTOMAT) DEVI Use daily to check blood pressure 1 Device 0   cholecalciferol  (VITAMIN D ) 25 MCG (1000 UNIT) tablet Take 1,000 Units by mouth daily.     cholestyramine  (QUESTRAN ) 4 GM/DOSE powder Take 0.5 packets (2 g total)  by mouth 3 (three) times daily with meals. 378 g 12   finasteride  (PROSCAR ) 5 MG tablet TAKE 1 TABLET BY MOUTH DAILY 100 tablet 2   KLOR-CON  M20 20 MEQ tablet TAKE 1 TABLET (20 MEQ TOTAL) BY MOUTH 2 (TWO) TIMES DAILY. WITH FOOD 4 tablet 0   lidocaine  (LIDODERM ) 5 % Place 1 patch onto the skin daily. Remove & Discard patch within 12 hours or as directed by MD 30 patch 0   lisinopril  (ZESTRIL ) 20 MG tablet TAKE 1 TABLET BY MOUTH DAILY 100 tablet 2   Menthol-Camphor (TIGER BALM ARTHRITIS RUB EX) Apply 1 Application topically as needed.     metoprolol  tartrate (LOPRESSOR ) 25 MG tablet TAKE ONE-HALF TABLET BY  MOUTH TWICE DAILY 100 tablet 1   mupirocin ointment (BACTROBAN) 2 % Apply 1 Application topically daily. 22 g 0   nitroGLYCERIN  (NITROSTAT ) 0.4 MG SL tablet Place 1 tablet (0.4 mg total) under the tongue every 5 (five) minutes as needed for chest pain. Maximum of 3 doses. 25 tablet 3   omeprazole  (PRILOSEC) 20 MG capsule TAKE 2 CAPSULES BY MOUTH DAILY 200 capsule 2   oxybutynin  (DITROPAN ) 5 MG tablet TAKE 1 TABLET BY MOUTH TWICE  DAILY 200 tablet 2   polyethylene glycol powder (GLYCOLAX /MIRALAX ) 17 GM/SCOOP powder Take 17 g by mouth at bedtime. Dissolve 1 capful of power into any liquid and drink once daily     rOPINIRole  (REQUIP ) 0.25 MG tablet TAKE 1 TABLET BY MOUTH AT  BEDTIME 100 tablet 1   silodosin (RAPAFLO) 4 MG CAPS capsule Take 4 mg by mouth daily.     Simethicone  Extra Strength 125 MG CAPS Take 125 mg by mouth as needed (gas).     Syringe/Needle, Disp, (SYRINGE 3CC/25GX1") 25G X 1" 3 ML MISC Use for b12 injections 50 each 0   triamcinolone  cream (KENALOG ) 0.1 % Apply 1 Application topically 2 (two) times daily. Until itching and rash resolve 80 g 0   linezolid (ZYVOX) 600 MG tablet Take 1 tablet (600 mg total) by mouth 2 (two) times daily. 8 tablet 0   No current facility-administered  medications for this visit.     Abtx:  Anti-infectives (From admission, onward)    Start      Dose/Rate Route Frequency Ordered Stop   04/17/24 0000  linezolid (ZYVOX) 600 MG tablet        600 mg Oral 2 times daily 04/17/24 1135         REVIEW OF SYSTEMS:  Const: negative fever, negative chills, negative weight loss Eyes: negative diplopia or visual changes, negative eye pain ENT: negative coryza, negative sore throat Resp: negative cough, hemoptysis, dyspnea Cards: negative for chest pain, palpitations, lower extremity edema GU: negative for frequency, dysuria and hematuria GI: Negative for abdominal pain, diarrhea, bleeding, constipation Skin: negative for rash and pruritus Heme: negative for easy bruising and gum/nose bleeding MS:  weakness Neurolo:negative for headaches, dizziness, vertigo, memory problems  Psych: negative for feelings of anxiety, depression  Endocrine: negative for thyroid , diabetes Allergy /Immunology- sulfa In wheel chair Objective:  VITALS:  BP (!) 145/78   Pulse 64   Temp 98.2 F (36.8 C) (Temporal)   SpO2 96%  s PHYSICAL EXAM:  General: Alert, cooperative, no distress, appears stated age.looks well  Head: Normocephalic, without obvious abnormality, atraumatic. Eyes: Conjunctivae clear, anicteric sclerae. Pupils are equal ENT Nares normal. No drainage or sinus tenderness. Lips, mucosa, and tongue normal. No Thrush Neck: Supple, symmetrical, no adenopathy, thyroid : non tender no carotid bruit and no JVD. Lungs: Clear to auscultation bilaterally. No Wheezing or Rhonchi. No rales. Heart: Regular rate and rhythm, no murmur, rub or gallop. Abdomen: Soft, non-tender,not distended. Bowel sounds normal. No masses Extremities:edema ankles Rt forearm fleshy gorwth, does not bleed, no tenderness  Skin: No rashes or lesions. Or bruising Lymph: Cervical, supraclavicular normal. Neurologic: not examined Pertinent Labs Lab Results CBC    Component Value Date/Time   WBC 8.9 04/10/2024 1659   RBC 4.08 (L) 04/10/2024 1659   HGB 13.5 04/10/2024  1659   HGB 15.2 01/06/2014 1655   HCT 40.2 04/10/2024 1659   HCT 44.4 01/06/2014 1655   PLT 231 04/10/2024 1659   PLT 174 01/06/2014 1655   MCV 98.5 04/10/2024 1659   MCV 94 01/06/2014 1655   MCH 33.1 04/10/2024 1659   MCHC 33.6 04/10/2024 1659   RDW 12.5 04/10/2024 1659   RDW 13.3 01/06/2014 1655   LYMPHSABS 1.3 04/05/2024 0126   LYMPHSABS 1.5 12/01/2012 1106   MONOABS 0.4 04/05/2024 0126   MONOABS 0.6 12/01/2012 1106   EOSABS 0.1 04/05/2024 0126   EOSABS 0.2 12/01/2012 1106   BASOSABS 0.0 04/05/2024 0126   BASOSABS 0.0 12/01/2012 1106       Latest Ref Rng & Units 04/10/2024    4:59 PM 04/09/2024    4:29 AM 04/08/2024    5:56 AM  CMP  Glucose 70 - 99 mg/dL 086  93  98   BUN 8 - 23 mg/dL 9  7  7    Creatinine 0.61 - 1.24 mg/dL 5.78  4.69  6.29   Sodium 135 - 145 mmol/L 142  141  140   Potassium 3.5 - 5.1 mmol/L 4.1  3.5  3.4   Chloride 98 - 111 mmol/L 108  110  108   CO2 22 - 32 mmol/L 25  24  24    Calcium  8.9 - 10.3 mg/dL 9.2  8.3  8.3   Total Protein 6.5 - 8.1 g/dL 6.5     Total Bilirubin 0.0 - 1.2 mg/dL 0.5     Alkaline Phos 38 - 126 U/L 63  AST 15 - 41 U/L 31     ALT 0 - 44 U/L 8         Microbiology: Recent Results (from the past 240 hours)  Aerobic Culture w Gram Stain (superficial specimen)     Status: None   Collection Time: 04/07/24  5:53 PM   Specimen: Wound  Result Value Ref Range Status   Specimen Description   Final    WOUND Performed at [facility], [address], [place], [place] [address]    Special Requests   Final    FOREARM Performed at [facility], [address] [place], [place] [address]    Gram Stain   Final    NO WBC SEEN FEW GRAM POSITIVE COCCI Performed at [facility], [address], [place], [place] [address]    Culture   Final    RARE ENTEROBACTER GERGOVIAE ABUNDANT METHICILLIN RESISTANT STAPHYLOCOCCUS AUREUS    Report Status 04/11/2024 FINAL  Final   Organism ID, Bacteria ENTEROBACTER  GERGOVIAE  Final   Organism ID, Bacteria METHICILLIN RESISTANT STAPHYLOCOCCUS AUREUS  Final      Susceptibility   Enterobacter gergoviae - MIC*    AMPICILLIN <=2 SENSITIVE Sensitive     CEFEPIME  <=0.12 SENSITIVE Sensitive     CEFTAZIDIME <=1 SENSITIVE Sensitive     CEFTRIAXONE  <=0.25 SENSITIVE Sensitive     CIPROFLOXACIN <=0.25 SENSITIVE Sensitive     GENTAMICIN <=1 SENSITIVE Sensitive     IMIPENEM 0.5 SENSITIVE Sensitive     TRIMETH/SULFA <=20 SENSITIVE Sensitive     AMPICILLIN/SULBACTAM <=2 SENSITIVE Sensitive     PIP/TAZO <=4 SENSITIVE Sensitive ug/mL    * RARE ENTEROBACTER GERGOVIAE   Methicillin resistant staphylococcus aureus - MIC*    CIPROFLOXACIN <=0.5 SENSITIVE Sensitive     ERYTHROMYCIN 4 INTERMEDIATE Intermediate     GENTAMICIN <=0.5 SENSITIVE Sensitive     OXACILLIN RESISTANT Resistant     TETRACYCLINE <=1 SENSITIVE Sensitive     VANCOMYCIN  1 SENSITIVE Sensitive     TRIMETH/SULFA <=10 SENSITIVE Sensitive     CLINDAMYCIN <=0.25 SENSITIVE Sensitive     RIFAMPIN <=0.5 SENSITIVE Sensitive     Inducible Clindamycin NEGATIVE Sensitive     LINEZOLID 2 SENSITIVE Sensitive     * ABUNDANT METHICILLIN RESISTANT STAPHYLOCOCCUS AUREUS    Impression/Recommendation ? ?Methicillin-susceptible Staphylococcus aureus (MSSA) bacteremia   Was on cefazolin  and then midline stopped working on 04/15/24 and switched to linezolid MSSA bacteremia is being treated with linezolid, currently on day three of the antibiotic course, expected to complete 2 weeks  by Apr 20, 2024. He has transitioned from IV to oral antibiotics, which he tolerates well. Blood cultures will be repeated one to two weeks post-antibiotics to ensure infection clearance. As he will be seeing dermatology for the fleshy mushroom growth on his forearm which wa sositive for MRSA will continue linezolid until he sees dermatology on 5/20  Fleshy growth rt forearm  Keratacanthoma VS pyogenic granuloma . Culture had MRSA and  enterobacter likely colonizing this growth No change with treating MRSA with Linezolid The MRSA is localized to the wound and not present in the blood. The lesion is covered to prevent scratching and bleeding. Continue antibiotics for MRSA and refer to dermatology for evaluation and possible removal of the lesion.   Hypertension   Hypertension is managed with amlodipine .  Recurrent presyncopal episodes, falls On many medicines which have the potential to cause orthostasis- silosadin, oxybutynin , proscar , lisinopril  and amlodipine   Benign prostatic hyperplasia  He has an appt with Dr.Tullo this afternoon to discuss his symptoms and will ask her to add cbc/cmp if she is drawing any labs Blood culture to be done on 05/05/24 , 10 days after he completes antibiotic to make sure no bacteremia ? Discussed with patient, and family in detail Discussed with Dr.Tullo

## 2024-04-17 NOTE — Patient Outreach (Signed)
 SW Called the family to let them know that she is waiting for the [facility] and notes from the PCP and then she will send the information to several SNF to see if a bed is available and then will work the [employer] CMA and insurance for prior authorization. SW will follow up with the family on tomorrow 04/18/2024 at 10:00 am.  Jonda Neighbours, PhD [facility], [facility] Social Worker Direct Dial: [phone]  Fax: [phone]

## 2024-04-17 NOTE — Patient Instructions (Signed)
 No more amlodipine   unless BP is 160 or higher  Cialis can be started for prostate issues/voiding  issues  No nitroglycerin  ever with cialis   No more silodosin

## 2024-04-17 NOTE — Patient Instructions (Signed)
 Visit Information  Thank you for taking time to visit with me today. Please don't hesitate to contact me if I can be of assistance to you before our next scheduled appointment.  Our next appointment is by telephone on 04/18/2024 at 10:00 am Please call the care guide team at [phone] if you need to cancel or reschedule your appointment.   Following is a copy of your care plan:   Goals Addressed             This Visit's Progress    BSW VBCI Social Work Care Plan       Problems:   Family needs short term placement for patient  CSW Clinical Goal(s):   Over the next 1 days the Patient Wait for approval from [facility] for short term  will work with [facility] to address needs related to placement .  Interventions:  SW spoke to [employer]) along with the patient and the daughter [name] and the sone John, and I was told that if the PCP Madelon Scheuermann) will do request for a pre authorization for Rehab and to express the reason and to state the GAP of maybe why the patient was not sent from the hospital and the short term rehab is needed now. [employer] also stated the the PCP needs to make this an URGENT request for the pre authorization and it will take about 72 hours to approve or deny. The family was in better spirits after I spoke to them and shared with them that if approved that the 1st 20 days will be covered and a copay may kick in after that. They are currently at [facility] completing an order for a blood draw. [facility] felt as if this could be a possible approval.   Patient Goals/Self-Care Activities:  Coordinate with SW aund [employer] to assist with approval for STP .  Plan:   Telephone follow up appointment with care management team member scheduled for:  04/18/2024 at 10:00 am        Please call the Suicide and Crisis Lifeline: 988 go to [facility] Urgent [facility] [address] [place] [phone]) call 911 if you are experiencing a Mental  Health or Behavioral Health Crisis or need someone to talk to.  Patient verbalizes understanding of instructions and care plan provided today and agrees to view in MyChart. Active MyChart status and patient understanding of how to access instructions and care plan via MyChart confirmed with patient.     Jonda Neighbours, PhD [facility], [facility] Social Worker Direct Dial: [phone]  Fax: [phone]

## 2024-04-17 NOTE — Patient Outreach (Signed)
 Complex Care Management   Visit Note  04/17/2024  Name:  David Atkins MRN: [id number] DOB: 1929/07/06  Situation: Referral received for Complex Care Management related to SDOH Barriers:  Short term placement I obtained verbal consent from Patient Ans his daughter David Atkins and son David Atkins.  Visit completed with patient and his children   on the phone  Background:   Past Medical History:  Diagnosis Date   3-vessel coronary artery disease 2006   a.) s/p 3v revascularization (CABG)   Acute cholecystitis    Anxiety    Basal cell carcinoma 07/09/2012   Bilateral carotid artery stenosis    a.) doppler 07/13/2017L < 39% BICA; b.) doppler 11/05/2018: 40-59% RICA, 60-79% LICA; c.) doppler 11/11/2019, 11/16/2020, 11/15/2021: 1-39% RICA; 40-59% LICA; d.) doppler 11/24/2022: 1-39% BICA   BPH (benign prostatic hyperplasia)    BPPV (benign paroxysmal positional vertigo)    Cerebrovascular small vessel disease 01/02/2018   a.) brain MRI 01/02/2018: chronic small vessel ischemic changes within the cerebral white matter, basal ganglia and pons; b.) brain MRI 11/08/2025: progressive small vessel disease since previous MRI   Cholelithiasis    Chronic diastolic (congestive) heart failure (HCC)    a.) TTE 09/27/2022: EF 60-65%, mild MR, G1DD   Concussion w loss of consciousness of unsp duration, subs 12/29/2018   CVA (cerebral vascular accident) [facility])    a.) brain MRI 11/2022: lacunar infarcts within the bilateral centrum semiovale and left corona radiata   First degree heart block    Full incontinence of feces    GERD (gastroesophageal reflux disease)    Hx of CABG    Hyperlipidemia    Hypertension    Hypokalemia    IBS (irritable bowel syndrome)    Lower leg edema    Lumbar stenosis with neurogenic claudication 12/03/2013   Myocardial infarction [facility]) 2006   a.) Tx'd with 3v revascularization (CABG)   OAB (overactive bladder)    OSA on CPAP    Osteoporosis 2010   by DEXA at [facility]   PAD  (peripheral artery disease) (HCC)    Poor balance    PSVT (paroxysmal supraventricular tachycardia) (HCC) 11/02/2022   a.) holter 11/02/2022: SVT x 128 episodes --> fastest 4 beats at a max rate of 179 bpm; longested 17 beats at average rate of 98 bpm   RLS (restless legs syndrome)    a.) on ropinirole    S/P CABG x 3 2006   Seizure (HCC)    Sepsis (HCC)    Sepsis (HCC) 12/27/2022   Spondylolisthesis of lumbar region 11/23/2013   MRI Lumbar spine   Umbilical hernia     Assessment: Family called needing assistance with short term placement for the placement  SDOH Interventions    Flowsheet Row Telephone from 04/14/2024 in [place] [facility] Care Coordination from 11/09/2023 in [facility] Care Coordination Clinical Support from 07/24/2023 in [facility] [employer] from 01/02/2023 in [facility] Care Coordination Telephone from 01/01/2023 in [facility] [employer] Care Coordination  SDOH Interventions       Food Insecurity Interventions Intervention Not Indicated Intervention Not Indicated Intervention Not Indicated Intervention Not Indicated --  Housing Interventions Intervention Not Indicated Intervention Not Indicated Intervention Not Indicated Intervention Not Indicated Intervention Not Indicated  Transportation Interventions Intervention Not Indicated -- Intervention Not Indicated Intervention Not Indicated Intervention Not Indicated  Utilities Interventions Intervention Not Indicated Intervention Not Indicated Intervention Not Indicated -- --  Alcohol Usage Interventions -- --  Intervention Not Indicated (Score <7) -- --  Financial Strain Interventions -- -- Intervention Not Indicated -- --  Physical Activity Interventions -- -- Intervention Not Indicated -- --  Stress Interventions -- -- Intervention Not Indicated -- --  Social Connections Interventions -- -- Intervention  Not Indicated -- --          Recommendation:   Pending authorization for short term stay approval   Follow Up Plan:   Telephone follow up appointment date/time:  04/18/2024 at 10:00 am   Jonda Neighbours, PhD [facility], [facility] Social Worker Direct Dial: [phone]  Fax: [phone]

## 2024-04-18 ENCOUNTER — Encounter: Payer: Self-pay | Admitting: Internal Medicine

## 2024-04-18 ENCOUNTER — Ambulatory Visit: Admitting: Podiatry

## 2024-04-18 ENCOUNTER — Telehealth: Payer: Self-pay | Admitting: Internal Medicine

## 2024-04-18 ENCOUNTER — Ambulatory Visit: Payer: Self-pay

## 2024-04-18 ENCOUNTER — Other Ambulatory Visit: Payer: Self-pay | Admitting: Licensed Clinical Social Worker

## 2024-04-18 DIAGNOSIS — B9561 Methicillin susceptible Staphylococcus aureus infection as the cause of diseases classified elsewhere: Secondary | ICD-10-CM | POA: Diagnosis not present

## 2024-04-18 DIAGNOSIS — R7881 Bacteremia: Secondary | ICD-10-CM | POA: Diagnosis not present

## 2024-04-18 NOTE — Patient Outreach (Signed)
 Complex Care Management   Visit Note  04/18/2024  Name:  David Atkins MRN: [id number] DOB: 10-13-1929  Situation: Referral received for Complex Care Management related to SNF Placement I obtained verbal consent from Patient And his children [name] and [name].  Visit completed with patient and hsi children   on the phone  Background:   Past Medical History:  Diagnosis Date   3-vessel coronary artery disease 2006   a.) s/p 3v revascularization (CABG)   Acute cholecystitis    Anxiety    Basal cell carcinoma 07/09/2012   Bilateral carotid artery stenosis    a.) doppler 07/13/2017L < 39% BICA; b.) doppler 11/05/2018: 40-59% RICA, 60-79% LICA; c.) doppler 11/11/2019, 11/16/2020, 11/15/2021: 1-39% RICA; 40-59% LICA; d.) doppler 11/24/2022: 1-39% BICA   BPH (benign prostatic hyperplasia)    BPPV (benign paroxysmal positional vertigo)    Cerebrovascular small vessel disease 01/02/2018   a.) brain MRI 01/02/2018: chronic small vessel ischemic changes within the cerebral white matter, basal ganglia and pons; b.) brain MRI 11/08/2025: progressive small vessel disease since previous MRI   Cholelithiasis    Chronic diastolic (congestive) heart failure (HCC)    a.) TTE 09/27/2022: EF 60-65%, mild MR, G1DD   Concussion w loss of consciousness of unsp duration, subs 12/29/2018   CVA (cerebral vascular accident) [facility])    a.) brain MRI 11/2022: lacunar infarcts within the bilateral centrum semiovale and left corona radiata   First degree heart block    Full incontinence of feces    GERD (gastroesophageal reflux disease)    Hx of CABG    Hyperlipidemia    Hypertension    Hypokalemia    IBS (irritable bowel syndrome)    Lower leg edema    Lumbar stenosis with neurogenic claudication 12/03/2013   Myocardial infarction [facility]) 2006   a.) Tx'd with 3v revascularization (CABG)   OAB (overactive bladder)    OSA on CPAP    Osteoporosis 2010   by DEXA at [facility]   PAD (peripheral artery disease)  (HCC)    Poor balance    PSVT (paroxysmal supraventricular tachycardia) (HCC) 11/02/2022   a.) holter 11/02/2022: SVT x 128 episodes --> fastest 4 beats at a max rate of 179 bpm; longested 17 beats at average rate of 98 bpm   RLS (restless legs syndrome)    a.) on ropinirole    S/P CABG x 3 2006   Seizure (HCC)    Sepsis (HCC)    Sepsis (HCC) 12/27/2022   Spondylolisthesis of lumbar region 11/23/2013   MRI Lumbar spine   Umbilical hernia     Assessment: The family needed assistance with placement of the patient in a SNF. The SW assisted the family by calling [facility] along with the patient and his children  on yesterday and getting information about seeing if [facility] will cover the patient going from home to a SNF. On today the SW received the FL-2 and other documentation from the PCP, the Sw then contacted several SNF to see if a bed was available and finally was able to find a bed at [facility], the SW faxed the FL-2 and the other documentation. The communicated with the Theatre stage manager, the patients PCP, [facility] and [facility] on today to keep everyone up to date on the process of possible placement. [employer] provided approval  [id number] and the start date being today.      Recommendation:   The family will work with [facility] to get the patient to placement   Follow  Up Plan:   Telephone follow up appointment date/time:  04/23/2024 at 2:30 pm  Jonda Neighbours, PhD [facility], [facility] Social Worker Direct Dial: [phone]  Fax: [phone]

## 2024-04-18 NOTE — Telephone Encounter (Signed)
 Spoke with [place] at 3:10 and was transferred to Rice Chamorro for urgent referral SNF review, while on phone with Rice Chamorro approval was received at 3:30 PM approval number [id number]  and the Nurse assigned at [facility] is [place] and fax number is fax [phone] . Called [facility] spoke with the DON Darrian to give insurance approval number and ask for admission time which was given patient could be admitted tomorrow morning. Given to Camilo Cella to notify family.

## 2024-04-18 NOTE — Telephone Encounter (Signed)
 Patient's son [name] informed of labs and verbalized understanding. He will relay all information to [facility].  Also advised that pcp could also recheck K+  next week at his visit.  Diana Armijo Adel Holt, CMA

## 2024-04-18 NOTE — Telephone Encounter (Signed)
-----   Message from Alica Inks sent at 04/18/2024 12:00 PM EDT ----- Please let his daiughter know that labs are essentially normal- HE can have a banana or oranges  tto increase his Potassium which is borderline at 3.4 ----- Message ----- From: Interface, Lab In [place] Sent: 04/17/2024   4:23 PM EDT To: Alica Inks, MD

## 2024-04-18 NOTE — Telephone Encounter (Signed)
 Copied from CRM [id number]. Topic: General - Other >> Apr 18, 2024 12:04 PM Lajean Pike wrote: Reason for CRM: Rice Chamorro from [facility] [phone], Option 8 called in seeking to speak with David Atkins in regards to a skilled nursing facility request for the patient.   Rice Chamorro stated to need information for: Name of Doctor Ordering for member, diagnosis, need the name of the skilled nursing facility, need the members prior level of function, if he uses a walker or wheel chair, need his current living setting - live at home, assisted living, or facilty- stairs,  current level of function (PT evaluation) within the last 48hrs,  If member was seen within the last 48 hrs and could he walk by himself, did he need assistance, can he sit and stand on his own and does he need assistance with mobility.   Rice Chamorro also stated that the deadline is for 3PM this afternoon to have it submitted.

## 2024-04-18 NOTE — Transitions of Care (Post Inpatient/ED Visit) (Signed)
 04/18/2024  Patient ID: David Atkins, male   DOB: Sep 12, 1929, 89 y.o.   MRN: [id number]  Outreach completed to the patient's daughter to check on the patient's status today. He went to the PCP yesterday and is now basically non-ambulatory which is decline. The family is now thinking placement in a SNF for Rehab. Social Work with [facility] is involved. This is the current situation:  I spoke to [place]) [facility]) along with the patient and the daughter David Atkins and the sone David Atkins, and I was told that if the PCP Madelon Scheuermann) will do request for a pre authorization for Rehab and to express the reason and to state the [facility] of maybe why the patient was not sent from the hospital and the short term rehab is needed now. UHC also stated the the PCP needs to make this an URGENT request for the pre authorization and it will take about 72 hours to approve or deny. The family was in better spirits after I spoke to them and shared with them that if approved that the 1st 20 days will be covered and a copay may kick in after that. They are currently at [facility] completing an order for a blood draw. [facility] felt as if this could be a possible approval.  RNCM to reach out next week for an update.  Gareld June, BSN, RN [place]  [facility] [employer] [facility] RN Care Manager [phone]

## 2024-04-18 NOTE — Telephone Encounter (Signed)
 Spoke with [place] and she stated that she just received the fax.

## 2024-04-18 NOTE — Addendum Note (Signed)
 Addended by: Dessie Flow on: 04/18/2024 12:22 PM   Modules accepted: Orders

## 2024-04-18 NOTE — Telephone Encounter (Signed)
 Family has been notified and given the number to contact Darrian in the morning.

## 2024-04-18 NOTE — Patient Instructions (Signed)
 Visit Information  Thank you for taking time to visit with me today. Please don't hesitate to contact me if I can be of assistance to you before our next scheduled appointment.  Your next care management appointment is by telephone on 04/23/2024 at 2:30 pm   Please call the care guide team at [phone] if you need to cancel, schedule, or reschedule an appointment.   Please call the Suicide and Crisis Lifeline: 988 call 1-800-273-TALK (toll free, 24 hour hotline) call 911 if you are experiencing a Mental Health or Behavioral Health Crisis or need someone to talk to.  Jonda Neighbours, PhD [facility], [facility] Social Worker Direct Dial: [phone]  Fax: [phone]

## 2024-04-19 DIAGNOSIS — C44622 Squamous cell carcinoma of skin of right upper limb, including shoulder: Secondary | ICD-10-CM | POA: Diagnosis not present

## 2024-04-19 DIAGNOSIS — I708 Atherosclerosis of other arteries: Secondary | ICD-10-CM | POA: Diagnosis not present

## 2024-04-19 DIAGNOSIS — I2510 Atherosclerotic heart disease of native coronary artery without angina pectoris: Secondary | ICD-10-CM | POA: Diagnosis not present

## 2024-04-19 DIAGNOSIS — R55 Syncope and collapse: Secondary | ICD-10-CM | POA: Diagnosis not present

## 2024-04-19 DIAGNOSIS — K5900 Constipation, unspecified: Secondary | ICD-10-CM | POA: Diagnosis not present

## 2024-04-19 DIAGNOSIS — I10 Essential (primary) hypertension: Secondary | ICD-10-CM | POA: Diagnosis not present

## 2024-04-19 DIAGNOSIS — G4733 Obstructive sleep apnea (adult) (pediatric): Secondary | ICD-10-CM | POA: Diagnosis not present

## 2024-04-19 DIAGNOSIS — J449 Chronic obstructive pulmonary disease, unspecified: Secondary | ICD-10-CM | POA: Diagnosis not present

## 2024-04-19 DIAGNOSIS — M6281 Muscle weakness (generalized): Secondary | ICD-10-CM | POA: Diagnosis not present

## 2024-04-19 DIAGNOSIS — D485 Neoplasm of uncertain behavior of skin: Secondary | ICD-10-CM | POA: Diagnosis not present

## 2024-04-19 DIAGNOSIS — B9561 Methicillin susceptible Staphylococcus aureus infection as the cause of diseases classified elsewhere: Secondary | ICD-10-CM | POA: Diagnosis not present

## 2024-04-19 DIAGNOSIS — R2681 Unsteadiness on feet: Secondary | ICD-10-CM | POA: Diagnosis not present

## 2024-04-19 DIAGNOSIS — I739 Peripheral vascular disease, unspecified: Secondary | ICD-10-CM | POA: Diagnosis not present

## 2024-04-19 DIAGNOSIS — E785 Hyperlipidemia, unspecified: Secondary | ICD-10-CM | POA: Diagnosis not present

## 2024-04-19 DIAGNOSIS — L2989 Other pruritus: Secondary | ICD-10-CM | POA: Diagnosis not present

## 2024-04-19 DIAGNOSIS — I700 Atherosclerosis of aorta: Secondary | ICD-10-CM | POA: Diagnosis not present

## 2024-04-19 DIAGNOSIS — R296 Repeated falls: Secondary | ICD-10-CM | POA: Diagnosis not present

## 2024-04-19 DIAGNOSIS — K589 Irritable bowel syndrome without diarrhea: Secondary | ICD-10-CM | POA: Diagnosis not present

## 2024-04-19 DIAGNOSIS — I5032 Chronic diastolic (congestive) heart failure: Secondary | ICD-10-CM | POA: Diagnosis not present

## 2024-04-19 DIAGNOSIS — I110 Hypertensive heart disease with heart failure: Secondary | ICD-10-CM | POA: Diagnosis not present

## 2024-04-19 DIAGNOSIS — N390 Urinary tract infection, site not specified: Secondary | ICD-10-CM | POA: Diagnosis not present

## 2024-04-19 DIAGNOSIS — R569 Unspecified convulsions: Secondary | ICD-10-CM | POA: Diagnosis not present

## 2024-04-21 ENCOUNTER — Ambulatory Visit: Admitting: Internal Medicine

## 2024-04-21 ENCOUNTER — Other Ambulatory Visit (HOSPITAL_COMMUNITY): Payer: Self-pay

## 2024-04-21 DIAGNOSIS — K589 Irritable bowel syndrome without diarrhea: Secondary | ICD-10-CM | POA: Diagnosis not present

## 2024-04-21 DIAGNOSIS — K5900 Constipation, unspecified: Secondary | ICD-10-CM | POA: Diagnosis not present

## 2024-04-21 DIAGNOSIS — M6281 Muscle weakness (generalized): Secondary | ICD-10-CM | POA: Diagnosis not present

## 2024-04-21 DIAGNOSIS — I10 Essential (primary) hypertension: Secondary | ICD-10-CM | POA: Diagnosis not present

## 2024-04-21 DIAGNOSIS — N390 Urinary tract infection, site not specified: Secondary | ICD-10-CM | POA: Diagnosis not present

## 2024-04-21 DIAGNOSIS — I2510 Atherosclerotic heart disease of native coronary artery without angina pectoris: Secondary | ICD-10-CM | POA: Diagnosis not present

## 2024-04-21 NOTE — Telephone Encounter (Signed)
 Pharmacy Patient Advocate Encounter  Received notification from [employer] that Prior Authorization for Tadalafil  5MG  tablets has been APPROVED from 04/17/2024 to 12/03/2024. Ran test claim, Copay is $5.07. This test claim was processed through [facility]- copay amounts may vary at other pharmacies due to pharmacy/plan contracts, or as the patient moves through the different stages of their insurance plan.   PA #/Case ID/Reference #: [id number]

## 2024-04-22 ENCOUNTER — Telehealth: Payer: Self-pay

## 2024-04-22 DIAGNOSIS — D485 Neoplasm of uncertain behavior of skin: Secondary | ICD-10-CM | POA: Diagnosis not present

## 2024-04-22 DIAGNOSIS — N390 Urinary tract infection, site not specified: Secondary | ICD-10-CM

## 2024-04-22 DIAGNOSIS — I2510 Atherosclerotic heart disease of native coronary artery without angina pectoris: Secondary | ICD-10-CM

## 2024-04-22 DIAGNOSIS — I700 Atherosclerosis of aorta: Secondary | ICD-10-CM

## 2024-04-22 DIAGNOSIS — N400 Enlarged prostate without lower urinary tract symptoms: Secondary | ICD-10-CM

## 2024-04-22 DIAGNOSIS — G4733 Obstructive sleep apnea (adult) (pediatric): Secondary | ICD-10-CM

## 2024-04-22 DIAGNOSIS — I708 Atherosclerosis of other arteries: Secondary | ICD-10-CM

## 2024-04-22 DIAGNOSIS — L2989 Other pruritus: Secondary | ICD-10-CM | POA: Diagnosis not present

## 2024-04-22 DIAGNOSIS — I739 Peripheral vascular disease, unspecified: Secondary | ICD-10-CM

## 2024-04-22 DIAGNOSIS — E785 Hyperlipidemia, unspecified: Secondary | ICD-10-CM

## 2024-04-22 DIAGNOSIS — I110 Hypertensive heart disease with heart failure: Secondary | ICD-10-CM

## 2024-04-22 DIAGNOSIS — I5032 Chronic diastolic (congestive) heart failure: Secondary | ICD-10-CM

## 2024-04-22 DIAGNOSIS — R569 Unspecified convulsions: Secondary | ICD-10-CM

## 2024-04-22 DIAGNOSIS — J449 Chronic obstructive pulmonary disease, unspecified: Secondary | ICD-10-CM

## 2024-04-22 LAB — CULTURE, BLOOD (ROUTINE X 2)
Culture: NO GROWTH
Special Requests: ADEQUATE

## 2024-04-23 ENCOUNTER — Other Ambulatory Visit: Payer: Self-pay | Admitting: Licensed Clinical Social Worker

## 2024-04-23 DIAGNOSIS — R296 Repeated falls: Secondary | ICD-10-CM | POA: Diagnosis not present

## 2024-04-23 DIAGNOSIS — N390 Urinary tract infection, site not specified: Secondary | ICD-10-CM | POA: Diagnosis not present

## 2024-04-23 DIAGNOSIS — I10 Essential (primary) hypertension: Secondary | ICD-10-CM | POA: Diagnosis not present

## 2024-04-23 NOTE — Patient Instructions (Signed)

## 2024-04-23 NOTE — Patient Outreach (Signed)
 Complex Care Management   Visit Note  04/23/2024  Name:  David Atkins MRN: [id number] DOB: [birth year]  Situation: Referral received for Complex Care Management related to SNF Placement I obtained verbal consent from Caregiver Patient.  Visit completed with the patients daughter David Atkins  on the phone  Background:   Past Medical History:  Diagnosis Date   3-vessel coronary artery disease 2006   a.) s/p 3v revascularization (CABG)   Acute cholecystitis    Anxiety    Basal cell carcinoma 07/09/2012   Bilateral carotid artery stenosis    a.) doppler 07/13/2017L < 39% BICA; b.) doppler 11/05/2018: 40-59% RICA, 60-79% LICA; c.) doppler 11/11/2019, 11/16/2020, 11/15/2021: 1-39% RICA; 40-59% LICA; d.) doppler 11/24/2022: 1-39% BICA   BPH (benign prostatic hyperplasia)    BPPV (benign paroxysmal positional vertigo)    Cerebrovascular small vessel disease 01/02/2018   a.) brain MRI 01/02/2018: chronic small vessel ischemic changes within the cerebral white matter, basal ganglia and pons; b.) brain MRI 11/08/2025: progressive small vessel disease since previous MRI   Cholelithiasis    Chronic diastolic (congestive) heart failure (HCC)    a.) TTE 09/27/2022: EF 60-65%, mild MR, G1DD   Concussion w loss of consciousness of unsp duration, subs 12/29/2018   CVA (cerebral vascular accident) [facility])    a.) brain MRI 11/2022: lacunar infarcts within the bilateral centrum semiovale and left corona radiata   First degree heart block    Full incontinence of feces    GERD (gastroesophageal reflux disease)    Hx of CABG    Hyperlipidemia    Hypertension    Hypokalemia    IBS (irritable bowel syndrome)    Lower leg edema    Lumbar stenosis with neurogenic claudication 12/03/2013   Myocardial infarction [facility]) 2006   a.) Tx'd with 3v revascularization (CABG)   OAB (overactive bladder)    OSA on CPAP    Osteoporosis 2010   by DEX[facility] at [facility]   PAD (peripheral artery disease) (HCC)    Poor balance     PSVT (paroxysmal supraventricular tachycardia) (HCC) 11/02/2022   a.) holter 11/02/2022: SVT x 128 episodes --> fastest 4 beats at a max rate of 179 bpm; longested 17 beats at average rate of 98 bpm   RLS (restless legs syndrome)    a.) on ropinirole    S/P CABG x 3 2006   Seizure (HCC)    Sepsis (HCC)    Sepsis (HCC) 12/27/2022   Spondylolisthesis of lumbar region 11/23/2013   MRI Lumbar spine   Umbilical hernia     Assessment: The patient was admitted on Saturday 04/19/2024 and he family is pleased and very grateful for the the work done from [facility] along with the PCP. The family is very satisfied with the care being provided by the SNF and stated that [facility] stated that out of all the years they have never worked with a team from the SW ([facility]) and the PCP with such professionalism and efficiency.   SDOH Interventions    Flowsheet Row Patient Outreach from 04/23/2024 in [place] POPULATION HEALTH DEPARTMENT Patient Outreach from 04/18/2024 in [place] POPULATION HEALTH DEPARTMENT Telephone from 04/14/2024 in [place] POPULATION HEALTH DEPARTMENT Care Coordination from 11/09/2023 in [facility] Care Coordination Clinical Support from 07/24/2023 in [facility] [employer] from 01/02/2023 in [facility] Care Coordination  SDOH Interventions        Food Insecurity Interventions Intervention Not Indicated Intervention Not Indicated  Intervention Not Indicated Intervention Not Indicated Intervention Not Indicated Intervention Not Indicated  Housing Interventions Intervention Not Indicated Intervention Not Indicated Intervention Not Indicated Intervention Not Indicated Intervention Not Indicated Intervention Not Indicated  Transportation Interventions Intervention Not Indicated Intervention Not Indicated Intervention Not Indicated -- Intervention Not Indicated Intervention Not Indicated  Utilities  Interventions Intervention Not Indicated Intervention Not Indicated Intervention Not Indicated Intervention Not Indicated Intervention Not Indicated --  Alcohol Usage Interventions -- -- -- -- Intervention Not Indicated (Score <7) --  Financial Strain Interventions Intervention Not Indicated -- -- -- Intervention Not Indicated --  Physical Activity Interventions -- -- -- -- Intervention Not Indicated --  Stress Interventions -- -- -- -- Intervention Not Indicated --  Social Connections Interventions -- -- -- -- Intervention Not Indicated --       Recommendation:   none  Follow Up Plan:   Closing From:  Complex Care Management  Jonda Neighbours, PhD [facility], [facility] Social Worker Direct Dial: [phone]  Fax: [phone]

## 2024-04-25 DIAGNOSIS — K589 Irritable bowel syndrome without diarrhea: Secondary | ICD-10-CM | POA: Diagnosis not present

## 2024-04-25 DIAGNOSIS — N390 Urinary tract infection, site not specified: Secondary | ICD-10-CM | POA: Diagnosis not present

## 2024-04-25 DIAGNOSIS — I10 Essential (primary) hypertension: Secondary | ICD-10-CM | POA: Diagnosis not present

## 2024-04-25 DIAGNOSIS — K5900 Constipation, unspecified: Secondary | ICD-10-CM | POA: Diagnosis not present

## 2024-04-25 DIAGNOSIS — R296 Repeated falls: Secondary | ICD-10-CM | POA: Diagnosis not present

## 2024-04-25 DIAGNOSIS — I2510 Atherosclerotic heart disease of native coronary artery without angina pectoris: Secondary | ICD-10-CM | POA: Diagnosis not present

## 2024-04-25 DIAGNOSIS — M6281 Muscle weakness (generalized): Secondary | ICD-10-CM | POA: Diagnosis not present

## 2024-04-29 ENCOUNTER — Other Ambulatory Visit: Payer: Self-pay | Admitting: Infectious Diseases

## 2024-04-30 DIAGNOSIS — N390 Urinary tract infection, site not specified: Secondary | ICD-10-CM | POA: Diagnosis not present

## 2024-04-30 DIAGNOSIS — K589 Irritable bowel syndrome without diarrhea: Secondary | ICD-10-CM | POA: Diagnosis not present

## 2024-04-30 DIAGNOSIS — I2510 Atherosclerotic heart disease of native coronary artery without angina pectoris: Secondary | ICD-10-CM | POA: Diagnosis not present

## 2024-04-30 DIAGNOSIS — I10 Essential (primary) hypertension: Secondary | ICD-10-CM | POA: Diagnosis not present

## 2024-05-01 ENCOUNTER — Encounter: Payer: Self-pay | Admitting: Internal Medicine

## 2024-05-01 ENCOUNTER — Telehealth: Payer: Self-pay | Admitting: Internal Medicine

## 2024-05-01 DIAGNOSIS — R29898 Other symptoms and signs involving the musculoskeletal system: Secondary | ICD-10-CM

## 2024-05-01 DIAGNOSIS — R531 Weakness: Secondary | ICD-10-CM

## 2024-05-01 DIAGNOSIS — R55 Syncope and collapse: Secondary | ICD-10-CM

## 2024-05-01 DIAGNOSIS — R6251 Failure to thrive (child): Secondary | ICD-10-CM

## 2024-05-01 NOTE — Telephone Encounter (Signed)
 Copied from CRM [id number]. Topic: Appointments - Appointment Scheduling >> May 01, 2024  3:22 PM Lajean Pike wrote: Rehab called and stated that patient is being discharged tomorrow and called in to schedule a hospital follow up for patient. Dates for provider are showing July as first available. Rehab requested I hold the July date but advised them I will message clinic for scheduling the hospital follow up. Patient may be reached at [phone].

## 2024-05-01 NOTE — Telephone Encounter (Signed)
 We have a hospital follow up appt available on 05/23/2024. Will that be okay to use? Pt is being discharger from rehab tomorrow.

## 2024-05-02 DIAGNOSIS — N390 Urinary tract infection, site not specified: Secondary | ICD-10-CM | POA: Diagnosis not present

## 2024-05-02 DIAGNOSIS — K589 Irritable bowel syndrome without diarrhea: Secondary | ICD-10-CM | POA: Diagnosis not present

## 2024-05-02 DIAGNOSIS — I2510 Atherosclerotic heart disease of native coronary artery without angina pectoris: Secondary | ICD-10-CM | POA: Diagnosis not present

## 2024-05-02 DIAGNOSIS — I10 Essential (primary) hypertension: Secondary | ICD-10-CM | POA: Diagnosis not present

## 2024-05-02 NOTE — Telephone Encounter (Signed)
 Spoke with pt's daughter and they have scheduled an appt for June 5th at 1pm.

## 2024-05-05 ENCOUNTER — Telehealth: Payer: Self-pay

## 2024-05-05 NOTE — Transitions of Care (Post Inpatient/ED Visit) (Signed)
 05/05/2024  Name: David Atkins MRN: [id number] DOB: [birth year]  Today's TOC FU Call Status: Today's TOC FU Call Status:: Successful TOC FU Call Completed TOC FU Call Complete Date: 05/05/24 Patient's Name and Date of Birth confirmed.  Transition Care Management Follow-up Telephone Call Date of Discharge: 05/02/24 Discharge Facility: Other ([facility] Facility) Name of Other ([facility]) [facility]charge Facility: [facility] Type of Discharge: Inpatient Admission Primary Inpatient Discharge Diagnosis:: UTI How have you been since you were released from the hospital?: Same Any questions or concerns?: No  Items Reviewed: Did you receive and understand the discharge instructions provided?: No Medications obtained,verified, and reconciled?: Yes (Medications Reviewed) Any new allergies since your discharge?: No Dietary orders reviewed?: Yes Do you have support at home?: Yes People in Home [RPT]: child(ren), adult  Medications Reviewed Today: Medications Reviewed Today     Reviewed by [name], LPN (Licensed Practical Nurse) on 05/05/24 at 1519  Med List Status: <None>   Medication Order Taking? Sig Documenting Provider Last Dose Status Informant  aspirin  81 MG EC tablet [id number] No Take 81 mg by mouth daily. [provider] Taking Active Spouse/Significant Other, Pharmacy Records, Child  atorvastatin  (LIPITOR) 20 MG tablet [id number]  Take 1 tablet (20 mg total) by mouth daily. Thersia Flax, MD  Active   Blood Pressure Monitoring (BLOOD PRESSURE MONITOR AUTOMAT) DEVI [id number] No Use daily to check blood pressure Madelon Scheuermann, Ray Caffey, MD Taking Active Spouse/Significant Other, Pharmacy Records, Child  cholecalciferol  (VITAMIN D ) 25 MCG (1000 UNIT) tablet [id number] No Take 1,000 Units by mouth daily. [provider] Taking Active Spouse/Significant Other, Pharmacy Records, Child  cholestyramine  (QUESTRAN ) 4 GM/DOSE powder [id number] No Take 0.5 packets (2 g total)  by mouth 3 (three) times daily with meals. Thersia Flax, MD Taking Active Child  finasteride  (PROSCAR ) 5 MG tablet [id number] No TAKE 1 TABLET BY MOUTH DAILY Thersia Flax, MD Taking Active Child  KLOR-CON  M20 20 MEQ tablet [id number] No TAKE 1 TABLET (20 MEQ TOTAL) BY MOUTH 2 (TWO) TIMES DAILY. WITH FOOD Thersia Flax, MD Taking Active Child  lidocaine  (LIDODERM ) 5 % [id number] No Place 1 patch onto the skin daily. Remove & Discard patch within 12 hours or as directed by MD Thersia Flax, MD Taking Active Child  linezolid  (ZYVOX ) 600 MG tablet [id number] No Take 1 tablet (600 mg total) by mouth 2 (two) times daily. Alica Inks, MD Taking Active   lisinopril  (ZESTRIL ) 20 MG tablet [id number] No TAKE 1 TABLET BY MOUTH DAILY Thersia Flax, MD Taking Active Child  Menthol-Camphor (TIGER BALM ARTHRITIS RUB EX) [id number] No Apply 1 Application topically as needed. [provider] Taking Active Child  metoprolol  tartrate (LOPRESSOR ) 25 MG tablet [id number]  TAKE ONE-HALF TABLET BY  MOUTH TWICE DAILY Thersia Flax, MD  Active   mupirocin  ointment (BACTROBAN ) 2 % [id number] No Apply 1 Application topically daily. Alica Inks, MD Taking Active   omeprazole  (PRILOSEC) 20 MG capsule [id number] No TAKE 2 CAPSULES BY MOUTH DAILY Tullo, Teresa L, MD Taking Active Child  oxybutynin  (DITROPAN ) 5 MG tablet [id number] No TAKE 1 TABLET BY MOUTH TWICE  DAILY Thersia Flax, MD Taking Active Child  polyethylene glycol powder (GLYCOLAX /MIRALAX ) 17 GM/SCOOP powder [id number] No Take 17 g by mouth at bedtime. Dissolve 1 capful of power into any liquid and drink once daily Sheril Dines, MD Taking Active   rOPINIRole  (REQUIP ) 0.25 MG tablet [id number]  Take 1 tablet (0.25 mg total) by mouth at bedtime. Tullo,  Ray Caffey, MD  Active   Simethicone  Extra Strength 125 MG CAPS [id number] No Take 125 mg by mouth as needed (gas). [provider] Taking Active Spouse/Significant Other, Pharmacy  Records, Child           Med Note Adrianne Horn   Thu Jan 05, 2022  3:52 PM) Taking 2 before dinner  Syringe/Needle, Disp, (SYRINGE 3CC/25GX1") 25G X 1" 3 ML MISC [id number] No Use for b12 injections Thersia Flax, MD Taking Active Spouse/Significant Other, Pharmacy Records, Child  tadalafil  (CIALIS ) 5 MG tablet [id number]  Take 1 tablet (5 mg total) by mouth daily. Thersia Flax, MD  Active   triamcinolone  cream (KENALOG ) 0.1 % [id number] No Apply 1 Application topically 2 (two) times daily. Until itching and rash resolve Thersia Flax, MD Taking Active Child           Med Note Felipe Horton, Lenon Radar   Sat Apr 05, 2024  7:06 AM) prn            Home Care and Equipment/Supplies: Were Home Health Services Ordered?: Yes Name of Home Health Agency:: Hospice Has Agency set up a time to come to your home?: Yes First Home Health Visit Date: 05/05/24 Any new equipment or medical supplies ordered?: NA Were you able to get the equipment/medical supplies?: No Do you have any questions related to the use of the equipment/supplies?: No  Functional Questionnaire: Do you need assistance with bathing/showering or dressing?: Yes Do you need assistance with meal preparation?: Yes Do you need assistance with eating?: No Do you have difficulty maintaining continence: Yes Do you need assistance with getting out of bed/getting out of a chair/moving?: Yes Do you have difficulty managing or taking your medications?: Yes  Follow up appointments reviewed: PCP Follow-up appointment confirmed?: Yes Date of PCP follow-up appointment?: 05/08/24 Follow-up Provider: [facility] Follow-up appointment confirmed?: NA Do you need transportation to your follow-up appointment?: No Do you understand care options if your condition(s) worsen?: Yes-patient verbalized understanding    SIGNATURE [name], LPN [facility] Nurse Health Advisor Direct Dial [phone]

## 2024-05-06 ENCOUNTER — Telehealth: Payer: Self-pay

## 2024-05-06 NOTE — Telephone Encounter (Signed)
 Copied from CRM [id number]. Topic: Clinical - Medication Question >> May 06, 2024  4:20 PM Aisha D wrote: Reason for CRM: [facility] is calling to request a refill for the cholestyramine  (QUESTRAN ) 4 GM/DOSE powder for the pt. She stated that the pt was recently discharged from rehab and they didn't send the medication back home with the pt and he is currently out of the medication.

## 2024-05-07 MED ORDER — CHOLESTYRAMINE 4 GM/DOSE PO POWD: 2.0000 g | Freq: Three times a day (TID) | ORAL | 0 refills | Status: DC

## 2024-05-07 NOTE — Addendum Note (Signed)
 Addended by: Chadwick Colonel on: 05/07/2024 11:04 AM   Modules accepted: Orders

## 2024-05-07 NOTE — Telephone Encounter (Signed)
 Medication has been sent to [facility] per pt's daughter's request.

## 2024-05-08 ENCOUNTER — Ambulatory Visit (INDEPENDENT_AMBULATORY_CARE_PROVIDER_SITE_OTHER): Admitting: Internal Medicine

## 2024-05-08 VITALS — BP 106/48 | HR 58 | Temp 97.5°F | Ht 70.0 in | Wt 162.8 lb

## 2024-05-08 DIAGNOSIS — R627 Adult failure to thrive: Secondary | ICD-10-CM | POA: Diagnosis not present

## 2024-05-08 MED ORDER — TRIAMCINOLONE ACETONIDE 0.1 % EX CREA
1.0000 | TOPICAL_CREAM | Freq: Two times a day (BID) | CUTANEOUS | 0 refills | Status: AC
Start: 2024-05-08 — End: ?

## 2024-05-08 NOTE — Patient Instructions (Addendum)
 Discontinue lisinopril  and atorvastatin    Continue cialis  daily  You can stop the finasteride  if you feel blood pressure going too low    [facility] a very good facility in [place] that provides step by step care for the elderly  Use the triamcinolone  ointment for one week on the foot

## 2024-05-08 NOTE — Progress Notes (Signed)
 Subjective:  Patient ID: David Atkins, male    DOB: Feb 23, 1929  Age: 89 y.o. MRN: [id number]  CC: The encounter diagnosis was Failure to thrive in adult.   HPI David Atkins presents for  Chief Complaint  Patient presents with   Hospitalization Follow-up    Patient came home from [facility] on 05/02/24 after 13 days in rehab.      Plez is accompanied  by David Atkins his daughter to discuss end of life care.  His recent placement at [facility] for rehab was unsuccesful for the following reasons :  The staff was not very accomodating or friendly He has lost 10 lbs since his  visit in May;  appetite has been diminished ,  and he is sleeping more and more .  His desire is to remain in his home until his passing.  David Atkins is working to arrange overnight care.       Outpatient Medications Prior to Visit  Medication Sig Dispense Refill   aspirin  81 MG EC tablet Take 81 mg by mouth daily.     Blood Pressure Monitoring (BLOOD PRESSURE MONITOR AUTOMAT) DEVI Use daily to check blood pressure 1 Device 0   cholecalciferol  (VITAMIN D ) 25 MCG (1000 UNIT) tablet Take 1,000 Units by mouth daily.     cholestyramine  (QUESTRAN ) 4 GM/DOSE powder Take 0.5 packets (2 g total) by mouth 3 (three) times daily with meals. 378 g 0   finasteride  (PROSCAR ) 5 MG tablet TAKE 1 TABLET BY MOUTH DAILY 100 tablet 2   KLOR-CON  M20 20 MEQ tablet TAKE 1 TABLET (20 MEQ TOTAL) BY MOUTH 2 (TWO) TIMES DAILY. WITH FOOD 4 tablet 0   lidocaine  (LIDODERM ) 5 % Place 1 patch onto the skin daily. Remove & Discard patch within 12 hours or as directed by MD 30 patch 0   Menthol-Camphor (TIGER BALM ARTHRITIS RUB EX) Apply 1 Application topically as needed.     metoprolol  tartrate (LOPRESSOR ) 25 MG tablet TAKE ONE-HALF TABLET BY  MOUTH TWICE DAILY 100 tablet 1   mupirocin  ointment (BACTROBAN ) 2 % Apply 1 Application topically daily. 22 g 0   omeprazole  (PRILOSEC) 20 MG capsule TAKE 2 CAPSULES BY MOUTH DAILY 200 capsule  2   oxybutynin  (DITROPAN ) 5 MG tablet TAKE 1 TABLET BY MOUTH TWICE  DAILY 200 tablet 2   polyethylene glycol powder (GLYCOLAX /MIRALAX ) 17 GM/SCOOP powder Take 17 g by mouth at bedtime. Dissolve 1 capful of power into any liquid and drink once daily     rOPINIRole  (REQUIP ) 0.25 MG tablet Take 1 tablet (0.25 mg total) by mouth at bedtime. 100 tablet 1   Simethicone  Extra Strength 125 MG CAPS Take 125 mg by mouth as needed (gas).     Syringe/Needle, Disp, (SYRINGE 3CC/25GX1") 25G X 1" 3 ML MISC Use for b12 injections 50 each 0   tadalafil  (CIALIS ) 5 MG tablet Take 1 tablet (5 mg total) by mouth daily. 30 tablet 11   atorvastatin  (LIPITOR) 20 MG tablet Take 1 tablet (20 mg total) by mouth daily. 100 tablet 2   lisinopril  (ZESTRIL ) 20 MG tablet TAKE 1 TABLET BY MOUTH DAILY 100 tablet 2   triamcinolone  cream (KENALOG ) 0.1 % Apply 1 Application topically 2 (two) times daily. Until itching and rash resolve 80 g 0   linezolid  (ZYVOX ) 600 MG tablet Take 1 tablet (600 mg total) by mouth 2 (two) times daily. 8 tablet 0   No facility-administered medications prior to visit.    Review of  Systems;  Patient denies headache, fevers, malaise, unintentional weight loss, skin rash, eye pain, sinus congestion and sinus pain, sore throat, dysphagia,  hemoptysis , cough, dyspnea, wheezing, chest pain, palpitations, orthopnea, edema, abdominal pain, nausea, melena, diarrhea, constipation, flank pain, dysuria, hematuria, urinary  Frequency, nocturia, numbness, tingling, seizures,  Focal weakness, Loss of consciousness,  Tremor, insomnia, depression, anxiety, and suicidal ideation.      Objective:  BP (!) 106/48   Pulse (!) 58   Temp (!) 97.5 F (36.4 C) (Oral)   Ht 5\' 10"  (1.778 m)   Wt 162 lb 12.8 oz (73.8 kg)   SpO2 98%   BMI 23.36 kg/m   BP Readings from Last 3 Encounters:  05/08/24 (!) 106/48  04/17/24 (!) 144/68  04/17/24 (!) 145/78    Wt Readings from Last 3 Encounters:  05/08/24 162 lb 12.8 oz  (73.8 kg)  04/17/24 172 lb (78 kg)  04/14/24 186 lb (84.4 kg)    Physical Exam Vitals reviewed.  Constitutional:      General: He is not in acute distress.    Appearance: Normal appearance. He is underweight. He is not ill-appearing, toxic-appearing or diaphoretic.     Comments: Chronically ill appearing   HENT:     Head: Normocephalic.  Eyes:     General: No scleral icterus.       Right eye: No discharge.        Left eye: No discharge.     Conjunctiva/sclera: Conjunctivae normal.  Cardiovascular:     Rate and Rhythm: Normal rate and regular rhythm.     Heart sounds: Normal heart sounds.  Pulmonary:     Effort: Pulmonary effort is normal. No respiratory distress.     Breath sounds: Normal breath sounds.  Musculoskeletal:        General: Normal range of motion.     Cervical back: Normal range of motion.  Skin:    General: Skin is warm and dry.  Neurological:     General: No focal deficit present.     Mental Status: He is alert and oriented to person, place, and time. Mental status is at baseline.  Psychiatric:        Mood and Affect: Mood normal.        Behavior: Behavior normal.        Thought Content: Thought content normal.        Judgment: Judgment normal.    Lab Results  Component Value Date   HGBA1C 5.7 11/17/2019   HGBA1C 5.7 02/08/2018   HGBA1C 5.6 11/02/2017    Lab Results  Component Value Date   CREATININE 0.80 04/17/2024   CREATININE 0.74 04/10/2024   CREATININE 0.77 04/09/2024    Lab Results  Component Value Date   WBC 7.2 04/17/2024   HGB 12.2 (L) 04/17/2024   HCT 36.0 (L) 04/17/2024   PLT 169 04/17/2024   GLUCOSE 139 (H) 04/17/2024   CHOL 98 12/07/2022   TRIG 92.0 12/07/2022   HDL 39.50 12/07/2022   LDLDIRECT CANCELED 11/02/2017   LDLCALC 40 12/07/2022   ALT 5 04/17/2024   AST 20 04/17/2024   NA 143 04/17/2024   K 3.4 (L) 04/17/2024   CL 108 04/17/2024   CREATININE 0.80 04/17/2024   BUN 16 04/17/2024   CO2 25 04/17/2024   TSH 1.70  06/04/2023   INR 1.2 12/27/2022   HGBA1C 5.7 11/17/2019   MICROALBUR <0.7 02/08/2018    No results found.  Assessment & Plan:  .Failure to thrive  in adult Assessment & Plan: He continues to have unintentional weight loss and inability to regain lower extremity strength for ambulation .  Given his progressive deterioration,  I have discussed referral to Hospice for end of life care.    Other orders -     Triamcinolone  Acetonide; Apply 1 Application topically 2 (two) times daily. Until itching and rash resolve  Dispense: 80 g; Refill: 0     I spent 34 minutes on the day of this face to face encounter reviewing patient's  recent rehabilitation  attempts,  unintentional weight loss,  goals of care ,  reviewing the assessment and plan with patient, and post visit ordering and reviewing of  therapeutics with patient   and daughter David Atkins   Follow-up: Return in about 4 weeks (around 06/05/2024).   Thersia Flax, MD

## 2024-05-09 ENCOUNTER — Telehealth: Payer: Self-pay

## 2024-05-09 NOTE — Telephone Encounter (Signed)
 Copied from CRM [id number]. Topic: Clinical - Medication Question >> May 09, 2024  2:43 PM Albertha Alosa wrote: Reason for CRM: Nurse, Cesalea called regarding patient medication change stated tadalafil  (CIALIS ) 5 MG tablet - not covered medication stated pharmacy suggested to go back on finasteride  (PROSCAR ) 5 MG tablet   [phone]

## 2024-05-11 DIAGNOSIS — R627 Adult failure to thrive: Secondary | ICD-10-CM | POA: Insufficient documentation

## 2024-05-11 NOTE — Assessment & Plan Note (Signed)
 He continues to have unintentional weight loss and inability to regain lower extremity strength for ambulation .  Given his progressive deterioration,  I have discussed referral to Hospice for end of life care.

## 2024-05-13 NOTE — Telephone Encounter (Signed)
 Hey I received an approval for this May 15

## 2024-05-14 ENCOUNTER — Telehealth: Payer: Self-pay

## 2024-05-14 NOTE — Telephone Encounter (Signed)
 Copied from CRM [id number]. Topic: General - Other >> May 14, 2024 12:21 PM Aisha D wrote: Reason for CRM: Cisley,RN, with [facility] was made aware that the Tadalafil  has been approved through the end of the year. The copay is $5.07.

## 2024-05-14 NOTE — Telephone Encounter (Signed)
 LMTCB with home health nurse to let her know that the Tadalafil  has been approved through the end of the year. The copay is $5.07.

## 2024-05-29 ENCOUNTER — Ambulatory Visit: Admitting: Podiatry

## 2024-05-29 ENCOUNTER — Encounter: Payer: Self-pay | Admitting: Podiatry

## 2024-05-29 DIAGNOSIS — B351 Tinea unguium: Secondary | ICD-10-CM | POA: Diagnosis not present

## 2024-05-29 DIAGNOSIS — I739 Peripheral vascular disease, unspecified: Secondary | ICD-10-CM

## 2024-05-29 DIAGNOSIS — M79674 Pain in right toe(s): Secondary | ICD-10-CM

## 2024-05-29 DIAGNOSIS — M79675 Pain in left toe(s): Secondary | ICD-10-CM

## 2024-05-30 ENCOUNTER — Other Ambulatory Visit: Payer: Self-pay | Admitting: Internal Medicine

## 2024-06-01 NOTE — Progress Notes (Signed)
  Subjective:  Patient ID: David Atkins, male    DOB: 29-Dec-1928,  MRN: [id number]  Gracin Mcpartland Paxson presents to clinic today for at risk foot care. Patient has h/o PAD and painful mycotic toenails x 10 which interfere with daily activities. Pain is relieved with periodic professional debridement.  Chief Complaint  Patient presents with   Nail Problem    Thick painful toenails   New problem(s): None.   PCP is Marylynn Verneita CROME, MD. ARNETTA 05/08/2024.  Allergies  Allergen Reactions   Sulfa Antibiotics Rash    Review of Systems: Negative except as noted in the HPI.  Objective: No changes noted in today's physical examination. There were no vitals filed for this visit. David Atkins is a pleasant 89 y.o. male WD, WN in NAD. AAO x 3.  Vascular Examination: CFT <4 seconds b/l. Nonpalpable pedal pulses b/l.  Digital hair absent. Skin temperature gradient warm to cool b/l. No ischemia or gangrene. No cyanosis or clubbing noted b/l. Dependent edema noted b/l LE.   Neurological Examination: Sensation grossly intact b/l with 10 gram monofilament. Vibratory sensation intact b/l.   Dermatological Examination: Pedal skin thin, shiny and atrophic b/l. No open wounds. No interdigital macerations.   Toenails 1-5 b/l thick, discolored, elongated with subungual debris and pain on dorsal palpation.   No corns, calluses nor porokeratotic lesions noted.  Musculoskeletal Examination: Muscle strength 5/5 to all lower extremity muscle groups bilaterally. No pain, crepitus or joint limitation noted with ROM b/l LE. HAV with bunion deformity noted b/l LE.[name] Patient ambulates with rollator assistance.  Radiographs: None  Assessment/Plan: 1. Pain due to onychomycosis of toenails of both feet   2. PAD (peripheral artery disease) (HCC)   Consent given for treatment. Patient examined. All patient's and/or POA's questions/concerns addressed on today's visit. Mycotic toenails 1-5 debrided in  length and girth without incident. Continue soft, supportive shoe gear daily. Report any pedal injuries to medical professional. Call office if there are any quesitons/concerns. -Patient/POA to call should there be question/concern in the interim.   Return in about 3 months (around 08/29/2024).  Delon CROME Merlin, DPM      [facility] LOCATION: [address] [place] [address]                   Office [phone]   [facility] LOCATION: [address] [place], [place] [address] Office [phone]

## 2024-06-12 ENCOUNTER — Other Ambulatory Visit: Payer: Self-pay | Admitting: Internal Medicine

## 2024-06-13 ENCOUNTER — Other Ambulatory Visit: Payer: Self-pay

## 2024-06-13 NOTE — Progress Notes (Signed)
 Spoke with pt's daughter stated that pt is not taking Finasteride  or Flomax . Stated this has been the case since 04/10/24. Both medications have been removed for pt's chart

## 2024-06-17 DIAGNOSIS — H6123 Impacted cerumen, bilateral: Secondary | ICD-10-CM | POA: Diagnosis not present

## 2024-06-17 DIAGNOSIS — H903 Sensorineural hearing loss, bilateral: Secondary | ICD-10-CM | POA: Diagnosis not present

## 2024-06-17 DIAGNOSIS — J300 Vasomotor rhinitis: Secondary | ICD-10-CM | POA: Diagnosis not present

## 2024-06-18 ENCOUNTER — Inpatient Hospital Stay: Admitting: Internal Medicine

## 2024-06-23 ENCOUNTER — Encounter: Payer: Self-pay | Admitting: Internal Medicine

## 2024-06-26 ENCOUNTER — Other Ambulatory Visit: Payer: Self-pay | Admitting: Internal Medicine

## 2024-06-29 NOTE — Telephone Encounter (Signed)
 Pt was last seen on 05/08/2024. Okay to refill.

## 2024-06-30 ENCOUNTER — Inpatient Hospital Stay: Admitting: Internal Medicine

## 2024-06-30 NOTE — Telephone Encounter (Signed)
 Noted

## 2024-07-17 ENCOUNTER — Telehealth: Payer: Self-pay

## 2024-07-17 NOTE — Telephone Encounter (Signed)
 Error

## 2024-07-19 ENCOUNTER — Other Ambulatory Visit: Payer: Self-pay | Admitting: Internal Medicine

## 2024-09-04 ENCOUNTER — Ambulatory Visit: Admitting: Podiatry

## 2024-09-04 DIAGNOSIS — Z538 Procedure and treatment not carried out for other reasons: Secondary | ICD-10-CM

## 2024-09-04 NOTE — Progress Notes (Signed)
 1. Appointment canceled by hospital    Appt rescheduled.

## 2024-09-18 ENCOUNTER — Telehealth: Payer: Self-pay

## 2024-09-18 NOTE — Telephone Encounter (Signed)
 Copied from CRM [id number]. Topic: Clinical - Medical Advice >> Sep 18, 2024 12:48 PM Maisie BROCKS wrote: Reason for CRM: Reena Castilla from [facility] called and stated that he is having thick secretion. She is requesting for pcp to give an order for duoneb treatment. Please callback: [phone]

## 2024-09-18 NOTE — Telephone Encounter (Signed)
Verbal order has been given.  

## 2024-09-23 ENCOUNTER — Telehealth: Payer: Self-pay

## 2024-09-23 NOTE — Telephone Encounter (Signed)
 Copied from CRM #[id number]. Topic: General - Other >> Sep 23, 2024 11:40 AM Thersia BROCKS wrote: Reason for CRM: Patient daugther, Newell send a message stated being discharge from his facility  because he does not meet the criteria  want to know what would be provider recommendations    [phone]

## 2024-09-24 NOTE — Telephone Encounter (Signed)
 Returned call to patient's daughter Newell. Newell states the special transportation fleeta will be going back home here in [place]. Newell says last Friday, hospice the patient was on death's bed. Patient has stopped eating for almost a month and massive diarrhea. But, patient woke up today wanting water, a ice cream sundae and states his urine cleared up discoloration of brown close to color of tea to yellow. Newell says the patient with complain of pain in between his muscles and patient has a foley currently. Hospice states the patient did not qualify on a hospice bed in there other wing and sent him home.

## 2024-09-30 ENCOUNTER — Encounter: Payer: Self-pay | Admitting: Internal Medicine

## 2024-09-30 ENCOUNTER — Telehealth (INDEPENDENT_AMBULATORY_CARE_PROVIDER_SITE_OTHER): Admitting: Internal Medicine

## 2024-09-30 DIAGNOSIS — Z515 Encounter for palliative care: Secondary | ICD-10-CM

## 2024-09-30 NOTE — Telephone Encounter (Signed)
 noted

## 2024-09-30 NOTE — Progress Notes (Unsigned)
 Virtual Visit via Caregility   Note   This format is felt to be most appropriate for this patient at this time.  All issues noted in this document were discussed and addressed.  No physical exam was performed (except for noted visual exam findings with Video Visits).   I connected with David Atkins  on 10/01/24 at  5:00 PM EDT by a video enabled telemedicine application o and verified that I am speaking with the correct person using two identifiers. Location patient: home Location provider: work or home office Persons participating in the virtual visit: patient, provider,  patient's wife Alwyn and son Norleen   I discussed the limitations, risks, security and privacy concerns of performing an evaluation and management service by telephone and the availability of in person appointments. I also discussed with the patient that there may be a patient responsible charge related to this service. The patient expressed understanding and agreed to proceed.  Reason for visit:  released from Hospice   HPI:  David Atkins is a 89 yr old male  with FTT who was [facility] to the care of his family due to lack of terminal diagnosis.  He has been home for the past 10 days.  He is bedbound,  and eating drinkin on average 5 tablespoons of liquid daily.  He has a Foley catheter and is using a bedpan to collect his stools.  He is making small amounts of urine and small formed stools are infrequent.  He denies abdominal pain or pain of any kind. His family has hired [employer] to stay with him at night,  and his son norleen and raynold Beau are providing assistance during the day.  Hospice has porivded a hspi;ital bed and a CNA comes out twice weekly to bathe him.    He appears quite comfortable and is responsive verbally  without signs of distres or delirium.  He has asked me to render an opinion as to his life expectancy  ROS: See pertinent positives and negatives per HPI.  Past Medical History:   Diagnosis Date   3-vessel coronary artery disease 2006   a.) s/p 3v revascularization (CABG)   Acute cholecystitis    Anxiety    Basal cell carcinoma 07/09/2012   Bilateral carotid artery stenosis    a.) doppler 07/13/2017L < 39% BICA; b.) doppler 11/05/2018: 40-59% RICA, 60-79% LICA; c.) doppler 11/11/2019, 11/16/2020, 11/15/2021: 1-39% RICA; 40-59% LICA; d.) doppler 11/24/2022: 1-39% BICA   BPH (benign prostatic hyperplasia)    BPPV (benign paroxysmal positional vertigo)    Cerebrovascular small vessel disease 01/02/2018   a.) brain MRI 01/02/2018: chronic small vessel ischemic changes within the cerebral white matter, basal ganglia and pons; b.) brain MRI 11/08/2025: progressive small vessel disease since previous MRI   Cholelithiasis    Chronic diastolic (congestive) heart failure (HCC)    a.) TTE 09/27/2022: EF 60-65%, mild MR, G1DD   Concussion w loss of consciousness of unsp duration, subs 12/29/2018   CVA (cerebral vascular accident) [facility])    a.) brain MRI 11/2022: lacunar infarcts within the bilateral centrum semiovale and left corona radiata   First degree heart block    Full incontinence of feces    GERD (gastroesophageal reflux disease)    Hx of CABG    Hyperlipidemia    Hypertension    Hypokalemia    IBS (irritable bowel syndrome)    Lower leg edema    Lumbar stenosis with neurogenic claudication 12/03/2013   Myocardial infarction (  HCC) 2006   a.) Tx'd with 3v revascularization (CABG)   OAB (overactive bladder)    OSA on CPAP    Osteoporosis 2010   by DEXA at [facility]   PAD (peripheral artery disease)    Poor balance    PSVT (paroxysmal supraventricular tachycardia) 11/02/2022   a.) holter 11/02/2022: SVT x 128 episodes --> fastest 4 beats at a max rate of 179 bpm; longested 17 beats at average rate of 98 bpm   RLS (restless legs syndrome)    a.) on ropinirole    S/P CABG x 3 2006   Seizure (HCC)    Sepsis (HCC)    Sepsis (HCC) 12/27/2022   Spondylolisthesis of  lumbar region 11/23/2013   MRI Lumbar spine   Umbilical hernia     Past Surgical History:  Procedure Laterality Date   APPENDECTOMY  1951   COLONOSCOPY     COLONOSCOPY WITH PROPOFOL  N/A 11/15/2015   Procedure: COLONOSCOPY WITH PROPOFOL ;  Surgeon: Deward CINDERELLA Piedmont, MD;  Location: [facility] ENDOSCOPY;  Service: Gastroenterology;  Laterality: N/A;   CORONARY ARTERY BYPASS GRAFT  2006   3 vessel, s/p AMI   INTRAOPERATIVE CHOLANGIOGRAM  03/13/2023   Procedure: INTRAOPERATIVE CHOLANGIOGRAM;  Surgeon: Jordis Laneta FALCON, MD;  Location: [facility] ORS;  Service: General;;   IR EXCHANGE BILIARY DRAIN  02/28/2023   IR PERC CHOLECYSTOSTOMY  12/27/2022   LEFT HEART CATH AND CORONARY ANGIOGRAPHY Left 2006   NASAL SEPTOPLASTY W/ TURBINOPLASTY  1981   TONSILLECTOMY     UMBILICAL HERNIA REPAIR N/A 03/13/2023   Procedure: HERNIA REPAIR UMBILICAL ADULT;  Surgeon: Jordis Laneta FALCON, MD;  Location: [facility] ORS;  Service: General;  Laterality: N/A;    Family History  Problem Relation Age of Onset   Hyperlipidemia Mother    Hypertension Mother    Hypertension Father     SOCIAL HX:  reports that he quit smoking about 61 years ago. His smoking use included cigarettes. He has never used smokeless tobacco. He reports that he does not drink alcohol and does not use drugs.    Current Outpatient Medications:    acetaminophen  (TYLENOL ) 500 MG tablet, Take 500 mg by mouth every 6 (six) hours as needed., Disp: , Rfl:    aspirin  81 MG EC tablet, Take 81 mg by mouth daily., Disp: , Rfl:    Blood Pressure Monitoring (BLOOD PRESSURE MONITOR AUTOMAT) DEVI, Use daily to check blood pressure, Disp: 1 Device, Rfl: 0   cholecalciferol  (VITAMIN D ) 25 MCG (1000 UNIT) tablet, Take 1,000 Units by mouth daily., Disp: , Rfl:    cholestyramine  (QUESTRAN ) 4 GM/DOSE powder, Take 0.5 packets (2 g total) by mouth 3 (three) times daily with meals., Disp: 378 g, Rfl: 0   hydrOXYzine (ATARAX) 10 MG tablet, Take 10 mg by mouth every 4 (four) hours as needed.,  Disp: , Rfl:    KLOR-CON  M20 20 MEQ tablet, TAKE 1 TABLET (20 MEQ TOTAL) BY MOUTH 2 (TWO) TIMES DAILY. WITH FOOD, Disp: 4 tablet, Rfl: 0   lidocaine  (LIDODERM ) 5 %, Place 1 patch onto the skin daily. Remove & Discard patch within 12 hours or as directed by MD, Disp: 30 patch, Rfl: 0   LORazepam (ATIVAN) 0.5 MG tablet, Take 0.5 mg by mouth every 4 (four) hours as needed., Disp: , Rfl:    Menthol-Camphor (TIGER BALM ARTHRITIS RUB EX), Apply 1 Application topically as needed., Disp: , Rfl:    metoprolol  tartrate (LOPRESSOR ) 25 MG tablet, TAKE ONE-HALF TABLET BY  MOUTH TWICE DAILY,  Disp: 100 tablet, Rfl: 1   mupirocin  ointment (BACTROBAN ) 2 %, Apply 1 Application topically daily., Disp: 22 g, Rfl: 0   omeprazole  (PRILOSEC) 20 MG capsule, TAKE 2 CAPSULES BY MOUTH DAILY, Disp: 200 capsule, Rfl: 2   oxybutynin  (DITROPAN ) 5 MG tablet, TAKE 1 TABLET BY MOUTH TWICE  DAILY, Disp: 200 tablet, Rfl: 1   polyethylene glycol powder (GLYCOLAX /MIRALAX ) 17 GM/SCOOP powder, Take 17 g by mouth at bedtime. Dissolve 1 capful of power into any liquid and drink once daily, Disp: , Rfl:    rOPINIRole  (REQUIP ) 0.25 MG tablet, Take 1 tablet (0.25 mg total) by mouth at bedtime., Disp: 100 tablet, Rfl: 1   Simethicone  Extra Strength 125 MG CAPS, Take 125 mg by mouth as needed (gas)., Disp: , Rfl:    Syringe/Needle, Disp, (SYRINGE 3CC/25GX1) 25G X 1 3 ML MISC, Use for b12 injections, Disp: 50 each, Rfl: 0   triamcinolone  cream (KENALOG ) 0.1 %, Apply 1 Application topically 2 (two) times daily. Until itching and rash resolve, Disp: 80 g, Rfl: 0   tadalafil  (CIALIS ) 5 MG tablet, Take 1 tablet (5 mg total) by mouth daily. (Patient not taking: Reported on 09/30/2024), Disp: 30 tablet, Rfl: 11  EXAM:  VITALS per patient if applicable:  GENERAL: alert, oriented, appears well and in no acute distress  HEENT: atraumatic, conjunttiva clear, no obvious abnormalities on inspection of external nose and ears  NECK: normal movements  of the head and neck  LUNGS: on inspection no signs of respiratory distress, breathing rate appears normal, no obvious gross SOB, gasping or wheezing  CV: no obvious cyanosis  MS: moves all visible extremities without noticeable abnormality  PSYCH/NEURO: pleasant and cooperative, no obvious depression or anxiety, speech and thought processing grossly intact  ASSESSMENT AND PLAN: Encounter for end of life care Assessment & Plan: I have opined that his life expectancy is 5 to 14 days , based on his account of his oral intake.  He is appreciative of this information and appears to be ready to pass without fear or distress.  Continue home hospice care.        I discussed the assessment and treatment plan with the patient. The patient was provided an opportunity to ask questions and all were answered. The patient agreed with the plan and demonstrated an understanding of the instructions.   The patient was advised to call back or seek an in-person evaluation if the symptoms worsen or if the condition fails to improve as anticipated.   I spent 30 minutes dedicated to the care of this patient on the date of this encounter to include pre-visit review of patient's medical history,  face-to-face time with the patient , and post visit ordering of testing and therapeutics.    Verneita LITTIE Kettering, MD

## 2024-10-01 DIAGNOSIS — Z515 Encounter for palliative care: Secondary | ICD-10-CM | POA: Insufficient documentation

## 2024-10-01 NOTE — Assessment & Plan Note (Signed)
 I have opined that his life expectancy is 5 to 14 days , based on his account of his oral intake.  He is appreciative of this information and appears to be ready to pass without fear or distress.  Continue home hospice care.

## 2024-10-20 ENCOUNTER — Ambulatory Visit: Admitting: Podiatry

## 2024-11-01 ENCOUNTER — Other Ambulatory Visit: Payer: Self-pay | Admitting: Internal Medicine

## 2024-11-03 ENCOUNTER — Other Ambulatory Visit: Payer: Self-pay | Admitting: Internal Medicine

## 2024-11-13 ENCOUNTER — Ambulatory Visit: Payer: Self-pay

## 2024-11-13 NOTE — Telephone Encounter (Signed)
 Home health nurse is requesting an order for a urine specimen to check pt for a UTI. If okay I will place the order and give a verbal order.

## 2024-11-13 NOTE — Telephone Encounter (Signed)
 Sending it to doc of the day.

## 2024-11-13 NOTE — Telephone Encounter (Signed)
Ok to check urine and culture

## 2024-11-13 NOTE — Telephone Encounter (Signed)
 FYI Only or Action Required?: Action required by provider: medication refill request.  S/w Nurse Newell who is with pt at this time. Pt is having vivid hallucinations of his youth. Pt has not slept all night. Newell is requesting an order for urine testing. [name] found Nitrites and leukocytes in urine. She is also requesting an antibiotic be called in for pt. PT is allergic to sulfa medications. And would like antibiotic be in liquid form. [name]'s call back is [phone].   Patient was last seen in primary care on 09/30/2024 by Marylynn Verneita CROME, MD.  Called Nurse Triage reporting Hallucinations.  Symptoms began several days ago.  Interventions attempted: Nothing.  Symptoms are: rapidly worsening.  Triage Disposition: See Physician Within 24 Hours  Patient/caregiver understands and will follow disposition?: no - would like antibiotics called in and order for urine testing.                    Copied from CRM #[id number]. Topic: Clinical - Red Word Triage >> Nov 13, 2024  1:26 PM Franky GRADE wrote: Red Word that prompted transfer to Nurse Triage: Newell from [facility] is calling because she is currently with patient and states he is having severe and frequent hallucinations. Reason for Disposition  [1] Hallucinations AND [2] NEW-ONSET  Answer Assessment - Initial Assessment Questions 1. MAIN CONCERN: What happened that made you call today?     Vivid hallucinations 2. DESCRIPTION: What are the hallucinations like? Describe them for me. (e.g., auditory, visual, tactile; worsening; threatening) If auditory, ask Are they telling you to hurt yourself or someone else?     Seeing apple on the ceiling 3. ONSET: When did this start? (e.g., hours, days, months)     Since yesterday have been continues 4. PATTERN: Do they come and go, or are they present all the time?  Are they present now?     All the time 5. PRIOR HALLUCINATIONS: Have you had hallucinations in  the past? (e.g., same, different, worse)     Yes - worse 6. MENTAL HEALTH HISTORY: Have you ever been diagnosed with schizophrenia or any other mental health problem? (e.g., bipolar disorder, schizophrenia; dementia, Parkinsons)     na 7. MENTAL HEALTH MEDICINES: Are you taking any medicines for depression or other mental health problems? (e.g., psychiatric medicines; sleeping pills)     no 8. ALCOHOL or DRUG ABUSE: Have you been drinking alcohol or taking any drugs? Have you recently stopped drinking alcohol or using drugs?     no 9. MEDICINE CHANGES: Did you recently stop taking a medicine? (e.g., antidepressant, barbiturates, benzodiazepine, gabapentin )  Did you recently start a new medicine or increase the dose? (e.g., antidepressant, digoxin, sleep medicine, steroid, Parkinson's medicine)     Yes was given seroquel 10. SUPPORT: Is there anyone else with you?       family 57. STRESS: Are you experiencing any particular stressors?       na 12. OTHER SYMPTOMS: Are there any other symptoms? (e.g., difficulty breathing, headache, fever, weakness)       Tested + for Leukocytes and nitrites in urine.  Protocols used: Hallucinations-A-AH

## 2024-11-13 NOTE — Telephone Encounter (Signed)
 FYI Only or Action Required?: Daughter Newell calling to see where orders are. She is also requesting that antibiotic be flavored so pt will be more likely to take it.  Patient was last seen in primary care on 09/30/2024 by Marylynn Verneita CROME, MD.  Called Nurse Triage reporting Advice Only.  Symptoms began na.  Interventions attempted: Other: na.  Symptoms are: unchanged.  Triage Disposition: Call PCP Within 24 Hours  Patient/caregiver understands and will follow disposition?: Yes                     Copied from CRM #[id number]. Topic: Clinical - Red Word Triage >> Nov 13, 2024  4:09 PM Jayma L wrote: Red Word that prompted transfer to Nurse Triage: melinda calling , daughter . Patient is on hospice and has a uti , and is seeing apples and ice on the ceiling  and seeing things Reason for Disposition  [1] Follow-up call from patient regarding patient's clinical status AND [2] information NON-URGENT  Answer Assessment - Initial Assessment Questions 1. REASON FOR CALL: What is the main reason for your call? or How can I best help you?     Daughter Newell calling to see where orders are. She is also requesting that antibiotic be flavored so pt will be more likely to take it. 2. SYMPTOMS : Do you have any symptoms?      As before 3. OTHER QUESTIONS: Do you have any other questions?     no  Answer Assessment - Initial Assessment Questions 1. REASON FOR CALL or QUESTION: What is your reason for calling today? or How can I best     Daughter Newell calling to see where orders are. She is also requesting that antibiotic be flavored so pt will be more likely to take it.  2. CALLER: Document the source of call. (e.g., laboratory staff, caregiver or patient).     [name]  Protocols used: Information Only Call - No Triage-A-AH, PCP Call - No Triage-A-AH

## 2024-11-13 NOTE — Telephone Encounter (Signed)
 Verbal orders have been given to collect the UA w/ micro and the urine culture. [facility] stated that they will let us  know the results when they receive them.

## 2024-11-20 ENCOUNTER — Telehealth: Payer: Self-pay

## 2024-11-20 ENCOUNTER — Telehealth: Payer: Self-pay | Admitting: Internal Medicine

## 2024-11-20 NOTE — Telephone Encounter (Signed)
 I received the results of a urine culture yesterday , after receiving notification that David Atkins passed away in Hospice .  The urine culture showed that he had a yeast infection in his bladder

## 2024-11-20 NOTE — Telephone Encounter (Signed)
 Copied from CRM #[id number]. Topic: Clinical - Lab/Test Results >> Nov 20, 2024 10:21 AM Delon DASEN wrote: Reason for CRM: daughter Newell calling about lab results for patient, was not able to get into mychart to view- please call [phone] very upset about fathers passing

## 2024-11-20 NOTE — Telephone Encounter (Signed)
 Spoke with pt's daughter, Newell, to let her know that the urine results showed that pt had a yeast infection in the bladder. She gave a verbal understanding. Newell wanted to let us  know that she greatly appreciates everything that Dr. Marylynn and her team did for her dad all these years.

## 2024-11-30 ENCOUNTER — Other Ambulatory Visit: Payer: Self-pay | Admitting: Internal Medicine

## 2024-12-04 DEATH — deceased
